# Patient Record
Sex: Female | Born: 1945 | Race: Black or African American | Hispanic: No | Marital: Single | Smoking: Former smoker
Health system: Southern US, Community
[De-identification: ages and names within clinical notes are randomized; demographics above are authoritative.]

## PROBLEM LIST (undated history)

## (undated) DIAGNOSIS — G4730 Sleep apnea, unspecified: Secondary | ICD-10-CM

## (undated) DIAGNOSIS — E119 Type 2 diabetes mellitus without complications: Secondary | ICD-10-CM

## (undated) DIAGNOSIS — J45909 Unspecified asthma, uncomplicated: Secondary | ICD-10-CM

## (undated) DIAGNOSIS — I4891 Unspecified atrial fibrillation: Secondary | ICD-10-CM

## (undated) DIAGNOSIS — I739 Peripheral vascular disease, unspecified: Secondary | ICD-10-CM

## (undated) DIAGNOSIS — N189 Chronic kidney disease, unspecified: Secondary | ICD-10-CM

## (undated) DIAGNOSIS — K219 Gastro-esophageal reflux disease without esophagitis: Secondary | ICD-10-CM

## (undated) DIAGNOSIS — I219 Acute myocardial infarction, unspecified: Secondary | ICD-10-CM

## (undated) DIAGNOSIS — I10 Essential (primary) hypertension: Secondary | ICD-10-CM

## (undated) DIAGNOSIS — I2510 Atherosclerotic heart disease of native coronary artery without angina pectoris: Secondary | ICD-10-CM

## (undated) HISTORY — PX: APPENDECTOMY: SHX54

## (undated) HISTORY — DX: Peripheral vascular disease, unspecified: I73.9

## (undated) HISTORY — DX: Sleep apnea, unspecified: G47.30

## (undated) HISTORY — PX: OTHER SURGICAL HISTORY: SHX169

## (undated) HISTORY — DX: Gastro-esophageal reflux disease without esophagitis: K21.9

---

## 1978-03-08 HISTORY — PX: OTHER SURGICAL HISTORY: SHX169

## 1983-03-09 HISTORY — PX: ABDOMINAL HYSTERECTOMY: SHX81

## 1997-12-05 ENCOUNTER — Encounter: Admission: RE | Admit: 1997-12-05 | Discharge: 1997-12-05 | Payer: Self-pay | Admitting: Internal Medicine

## 1998-06-24 ENCOUNTER — Encounter: Payer: Self-pay | Admitting: Emergency Medicine

## 1998-06-24 ENCOUNTER — Encounter: Admission: RE | Admit: 1998-06-24 | Discharge: 1998-06-24 | Payer: Self-pay | Admitting: *Deleted

## 1998-08-10 ENCOUNTER — Emergency Department (HOSPITAL_COMMUNITY): Admission: EM | Admit: 1998-08-10 | Discharge: 1998-08-10 | Payer: Self-pay | Admitting: Emergency Medicine

## 1998-08-11 ENCOUNTER — Ambulatory Visit (HOSPITAL_COMMUNITY): Admission: RE | Admit: 1998-08-11 | Discharge: 1998-08-11 | Payer: Self-pay | Admitting: Emergency Medicine

## 1998-08-11 ENCOUNTER — Encounter: Payer: Self-pay | Admitting: Obstetrics and Gynecology

## 1998-09-17 ENCOUNTER — Other Ambulatory Visit: Admission: RE | Admit: 1998-09-17 | Discharge: 1998-09-17 | Payer: Self-pay | Admitting: Obstetrics and Gynecology

## 1998-10-21 ENCOUNTER — Ambulatory Visit (HOSPITAL_COMMUNITY): Admission: RE | Admit: 1998-10-21 | Discharge: 1998-10-21 | Payer: Self-pay | Admitting: Cardiovascular Disease

## 1998-10-21 ENCOUNTER — Encounter: Payer: Self-pay | Admitting: Cardiovascular Disease

## 1999-07-28 ENCOUNTER — Ambulatory Visit (HOSPITAL_COMMUNITY): Admission: RE | Admit: 1999-07-28 | Discharge: 1999-07-28 | Payer: Self-pay | Admitting: Gastroenterology

## 1999-09-09 ENCOUNTER — Emergency Department (HOSPITAL_COMMUNITY): Admission: EM | Admit: 1999-09-09 | Discharge: 1999-09-09 | Payer: Self-pay | Admitting: Emergency Medicine

## 1999-09-09 ENCOUNTER — Encounter: Payer: Self-pay | Admitting: Emergency Medicine

## 1999-11-03 ENCOUNTER — Encounter: Payer: Self-pay | Admitting: Endocrinology

## 1999-11-03 ENCOUNTER — Ambulatory Visit (HOSPITAL_COMMUNITY): Admission: RE | Admit: 1999-11-03 | Discharge: 1999-11-03 | Payer: Self-pay | Admitting: Endocrinology

## 1999-12-24 ENCOUNTER — Other Ambulatory Visit: Admission: RE | Admit: 1999-12-24 | Discharge: 1999-12-24 | Payer: Self-pay | Admitting: Obstetrics and Gynecology

## 2000-01-08 ENCOUNTER — Ambulatory Visit (HOSPITAL_COMMUNITY): Admission: RE | Admit: 2000-01-08 | Discharge: 2000-01-08 | Payer: Self-pay

## 2001-01-19 ENCOUNTER — Other Ambulatory Visit: Admission: RE | Admit: 2001-01-19 | Discharge: 2001-01-19 | Payer: Self-pay | Admitting: Obstetrics and Gynecology

## 2001-04-30 ENCOUNTER — Emergency Department (HOSPITAL_COMMUNITY): Admission: EM | Admit: 2001-04-30 | Discharge: 2001-04-30 | Payer: Self-pay | Admitting: *Deleted

## 2001-04-30 ENCOUNTER — Encounter: Payer: Self-pay | Admitting: *Deleted

## 2002-01-18 ENCOUNTER — Other Ambulatory Visit: Admission: RE | Admit: 2002-01-18 | Discharge: 2002-01-18 | Payer: Self-pay | Admitting: Obstetrics and Gynecology

## 2002-02-15 ENCOUNTER — Encounter: Admission: RE | Admit: 2002-02-15 | Discharge: 2002-02-15 | Payer: Self-pay | Admitting: Obstetrics and Gynecology

## 2002-02-15 ENCOUNTER — Encounter: Payer: Self-pay | Admitting: Obstetrics and Gynecology

## 2002-03-09 ENCOUNTER — Encounter: Admission: RE | Admit: 2002-03-09 | Discharge: 2002-03-09 | Payer: Self-pay | Admitting: Obstetrics and Gynecology

## 2002-03-09 ENCOUNTER — Encounter: Payer: Self-pay | Admitting: Obstetrics and Gynecology

## 2002-03-16 ENCOUNTER — Encounter: Payer: Self-pay | Admitting: Obstetrics and Gynecology

## 2002-03-16 ENCOUNTER — Encounter: Admission: RE | Admit: 2002-03-16 | Discharge: 2002-03-16 | Payer: Self-pay | Admitting: Obstetrics and Gynecology

## 2003-02-05 ENCOUNTER — Other Ambulatory Visit: Admission: RE | Admit: 2003-02-05 | Discharge: 2003-02-05 | Payer: Self-pay | Admitting: Obstetrics and Gynecology

## 2003-07-08 ENCOUNTER — Encounter: Admission: RE | Admit: 2003-07-08 | Discharge: 2003-07-08 | Payer: Self-pay | Admitting: Endocrinology

## 2004-06-22 ENCOUNTER — Encounter: Admission: RE | Admit: 2004-06-22 | Discharge: 2004-06-22 | Payer: Self-pay | Admitting: Endocrinology

## 2004-08-10 ENCOUNTER — Other Ambulatory Visit: Admission: RE | Admit: 2004-08-10 | Discharge: 2004-08-10 | Payer: Self-pay | Admitting: Obstetrics and Gynecology

## 2004-08-25 ENCOUNTER — Encounter: Admission: RE | Admit: 2004-08-25 | Discharge: 2004-08-25 | Payer: Self-pay | Admitting: Obstetrics and Gynecology

## 2004-11-24 ENCOUNTER — Emergency Department (HOSPITAL_COMMUNITY): Admission: EM | Admit: 2004-11-24 | Discharge: 2004-11-24 | Payer: Self-pay | Admitting: Emergency Medicine

## 2005-10-05 ENCOUNTER — Encounter: Admission: RE | Admit: 2005-10-05 | Discharge: 2005-10-05 | Payer: Self-pay | Admitting: Obstetrics and Gynecology

## 2006-07-07 ENCOUNTER — Encounter: Admission: RE | Admit: 2006-07-07 | Discharge: 2006-07-07 | Payer: Self-pay | Admitting: Family Medicine

## 2006-07-20 ENCOUNTER — Encounter: Admission: RE | Admit: 2006-07-20 | Discharge: 2006-07-20 | Payer: Self-pay | Admitting: Family Medicine

## 2007-03-13 ENCOUNTER — Emergency Department (HOSPITAL_COMMUNITY): Admission: EM | Admit: 2007-03-13 | Discharge: 2007-03-14 | Payer: Self-pay | Admitting: Emergency Medicine

## 2007-11-23 ENCOUNTER — Encounter: Admission: RE | Admit: 2007-11-23 | Discharge: 2007-11-23 | Payer: Self-pay | Admitting: Family Medicine

## 2008-03-05 ENCOUNTER — Encounter: Admission: RE | Admit: 2008-03-05 | Discharge: 2008-03-05 | Payer: Self-pay | Admitting: Family Medicine

## 2008-03-20 ENCOUNTER — Encounter: Admission: RE | Admit: 2008-03-20 | Discharge: 2008-03-20 | Payer: Self-pay | Admitting: Family Medicine

## 2008-09-27 ENCOUNTER — Encounter: Admission: RE | Admit: 2008-09-27 | Discharge: 2008-09-27 | Payer: Self-pay | Admitting: Family Medicine

## 2009-02-17 ENCOUNTER — Encounter: Admission: RE | Admit: 2009-02-17 | Discharge: 2009-02-17 | Payer: Self-pay | Admitting: Family Medicine

## 2010-03-29 ENCOUNTER — Encounter: Payer: Self-pay | Admitting: Family Medicine

## 2010-07-24 NOTE — Op Note (Signed)
[place]  Patient:    Dawn Wood, Dawn Wood                      MRN: [id number] Proc. Date: 07/28/99 Adm. Date:  [id number] Disc. Date: [id number] Attending:  Charna Elizabeth CC:         Reather Littler, M.D.                           Operative Report  DATE OF BIRTH:  03/16/1945  REFERRING PHYSICIAN:  Reather Littler, M.D.  PROCEDURE PERFORMED:  Colonoscopy.  ENDOSCOPIST:  Anselmo Rod, M.D.  INSTRUMENT:  Olympus video colonoscope.  INDICATION FOR PROCEDURE:  Blood in stool with a partial history of polyps in a 65 year old black female.  Rule out recurrent polyps.  PREPROCEDURE PREPARATION:  Informed consent was procured from the patient. The patient was fasting for eight hours prior to the procedure and prepped with a bottle of magnesium citrate and a gallon of NuLYTELY the night prior to the procedure.  PREPROCEDURE PHYSICAL:  VITAL SIGNS:  Stable.  NECK:  Supple.  CHEST:  Clear to auscultation.  HEART:  S1, S2 regular.  ABDOMEN:  Soft with normal abdominal bowel sounds.  DESCRIPTION OF THE PROCEDURE:  The patient was placed in the left lateral decubitus position and sedated with 80 mg of Demerol and 7.5 mg of Versed intravenously.  Once the patient was adequately sedated and maintained on low-flow oxygen and continuous cardiac monitoring, the Olympus video colonoscope was advanced from the rectum to the cecum without difficulty. The entire colon appeared healthy without evidence of diverticulosis, masses, polyps, erosions, or ulcerations.  No hemorrhoids were seen on retroflexion.  The patient tolerated the procedure well without complications.  IMPRESSION:  Normal colonoscopy.  RECOMMENDATIONS: 1. The patient has been advised to increased fluid and fiber in her diet. 2. Outpatient followup is advised in the next two weeks for further    recommendations. 3. Considering her personal history of polyps, a repeat colonoscopy is  recommended in the next five years.DD:  07/28/99 TD:  08/01/99 Job: [id number]

## 2010-08-07 ENCOUNTER — Other Ambulatory Visit: Payer: Self-pay | Admitting: Family Medicine

## 2010-08-07 DIAGNOSIS — Z1231 Encounter for screening mammogram for malignant neoplasm of breast: Secondary | ICD-10-CM

## 2010-08-18 ENCOUNTER — Ambulatory Visit
Admission: RE | Admit: 2010-08-18 | Discharge: 2010-08-18 | Disposition: A | Discharge status: Home / Self Care | Payer: BC Managed Care – PPO | Source: Ambulatory Visit | Attending: Family Medicine | Admitting: Family Medicine

## 2010-08-18 DIAGNOSIS — Z1231 Encounter for screening mammogram for malignant neoplasm of breast: Secondary | ICD-10-CM

## 2010-08-19 ENCOUNTER — Other Ambulatory Visit: Payer: Self-pay | Admitting: Family Medicine

## 2010-08-19 DIAGNOSIS — R928 Other abnormal and inconclusive findings on diagnostic imaging of breast: Secondary | ICD-10-CM

## 2010-08-27 ENCOUNTER — Ambulatory Visit
Admission: RE | Admit: 2010-08-27 | Discharge: 2010-08-27 | Disposition: A | Discharge status: Home / Self Care | Payer: BC Managed Care – PPO | Source: Ambulatory Visit | Attending: Family Medicine | Admitting: Family Medicine

## 2010-08-27 DIAGNOSIS — R928 Other abnormal and inconclusive findings on diagnostic imaging of breast: Secondary | ICD-10-CM

## 2010-11-26 LAB — COMPREHENSIVE METABOLIC PANEL
ALT: 17
AST: 15
Albumin: 3.8
Alkaline Phosphatase: 93
BUN: 14
CO2: 27
Calcium: 9.3
Chloride: 103
Creatinine, Ser: 0.82
GFR calc Af Amer: >60
GFR calc non Af Amer: >60
Glucose, Bld: 159 — ABNORMAL HIGH
Potassium: 3.3 — ABNORMAL LOW
Sodium: 139
Total Bilirubin: 0.6
Total Protein: 7.3

## 2010-11-26 LAB — DIFFERENTIAL
Basophils Absolute: 0
Basophils Relative: 0
Eosinophils Absolute: 0.1
Eosinophils Relative: 0
Lymphocytes Relative: 14
Lymphs Abs: 2.1
Monocytes Absolute: 0.3
Monocytes Relative: 2 — ABNORMAL LOW
Neutro Abs: 12.9 — ABNORMAL HIGH
Neutrophils Relative %: 84 — ABNORMAL HIGH

## 2010-11-26 LAB — CBC
HCT: 38.7
Hemoglobin: 12.8
MCHC: 33
MCV: 82
Platelets: 292
RBC: 4.72
RDW: 15.1
WBC: 15.3 — ABNORMAL HIGH

## 2010-11-26 LAB — URINALYSIS, ROUTINE W REFLEX MICROSCOPIC
Bilirubin Urine: NEGATIVE
Glucose, UA: NEGATIVE
Ketones, ur: NEGATIVE
Leukocytes, UA: NEGATIVE
Nitrite: NEGATIVE
Protein, ur: NEGATIVE
Specific Gravity, Urine: 1.011
Urobilinogen, UA: 0.2
pH: 6.5

## 2010-11-26 LAB — URINE MICROSCOPIC-ADD ON

## 2010-11-26 LAB — LIPASE, BLOOD: Lipase: 35

## 2011-06-07 ENCOUNTER — Other Ambulatory Visit: Payer: Self-pay | Admitting: Family Medicine

## 2011-06-07 ENCOUNTER — Ambulatory Visit
Admission: RE | Admit: 2011-06-07 | Discharge: 2011-06-07 | Disposition: A | Discharge status: Home / Self Care | Payer: BC Managed Care – PPO | Source: Ambulatory Visit | Attending: Family Medicine | Admitting: Family Medicine

## 2011-06-07 DIAGNOSIS — M79645 Pain in left finger(s): Secondary | ICD-10-CM

## 2011-08-19 ENCOUNTER — Other Ambulatory Visit: Payer: Self-pay | Admitting: Family Medicine

## 2011-08-19 DIAGNOSIS — Z1231 Encounter for screening mammogram for malignant neoplasm of breast: Secondary | ICD-10-CM

## 2011-09-14 ENCOUNTER — Ambulatory Visit
Admission: RE | Admit: 2011-09-14 | Discharge: 2011-09-14 | Disposition: A | Discharge status: Home / Self Care | Payer: BC Managed Care – PPO | Source: Ambulatory Visit | Attending: Family Medicine | Admitting: Family Medicine

## 2011-09-14 ENCOUNTER — Other Ambulatory Visit: Payer: Self-pay | Admitting: Family Medicine

## 2011-09-14 DIAGNOSIS — R319 Hematuria, unspecified: Secondary | ICD-10-CM

## 2011-09-17 ENCOUNTER — Ambulatory Visit
Admission: RE | Admit: 2011-09-17 | Discharge: 2011-09-17 | Disposition: A | Discharge status: Home / Self Care | Payer: BC Managed Care – PPO | Source: Ambulatory Visit | Attending: Family Medicine | Admitting: Family Medicine

## 2011-09-17 DIAGNOSIS — Z1231 Encounter for screening mammogram for malignant neoplasm of breast: Secondary | ICD-10-CM

## 2012-10-13 ENCOUNTER — Other Ambulatory Visit: Payer: Self-pay | Admitting: Family Medicine

## 2012-10-13 DIAGNOSIS — R1011 Right upper quadrant pain: Secondary | ICD-10-CM

## 2012-10-18 ENCOUNTER — Ambulatory Visit
Admission: RE | Admit: 2012-10-18 | Discharge: 2012-10-18 | Disposition: A | Discharge status: Home / Self Care | Payer: BC Managed Care – PPO | Source: Ambulatory Visit | Attending: Family Medicine | Admitting: Family Medicine

## 2012-10-18 DIAGNOSIS — R1011 Right upper quadrant pain: Secondary | ICD-10-CM

## 2012-12-26 ENCOUNTER — Other Ambulatory Visit: Payer: Self-pay | Admitting: Gastroenterology

## 2012-12-26 DIAGNOSIS — R1011 Right upper quadrant pain: Secondary | ICD-10-CM

## 2012-12-26 DIAGNOSIS — R112 Nausea with vomiting, unspecified: Secondary | ICD-10-CM

## 2013-01-03 ENCOUNTER — Encounter (HOSPITAL_COMMUNITY)
Admission: RE | Admit: 2013-01-03 | Discharge: 2013-01-03 | Disposition: A | Discharge status: Home / Self Care | Payer: BC Managed Care – PPO | Source: Ambulatory Visit | Attending: Gastroenterology | Admitting: Gastroenterology

## 2013-01-03 DIAGNOSIS — R112 Nausea with vomiting, unspecified: Secondary | ICD-10-CM | POA: Insufficient documentation

## 2013-01-03 DIAGNOSIS — R1011 Right upper quadrant pain: Secondary | ICD-10-CM | POA: Insufficient documentation

## 2013-01-03 MED ORDER — SINCALIDE 5 MCG IJ SOLR
0.0200 ug/kg | Freq: Once | INTRAMUSCULAR | Status: AC
  Administered 2013-01-03: 1.6 ug via INTRAVENOUS

## 2013-01-03 MED ORDER — SINCALIDE 5 MCG IJ SOLR
INTRAMUSCULAR | Status: AC
  Administered 2013-01-03: 1.6 ug via INTRAVENOUS
  Filled 2013-01-03: qty 5

## 2013-01-03 MED ORDER — TECHNETIUM TC 99M MEBROFENIN IV KIT
5.0000 | PACK | Freq: Once | INTRAVENOUS | Status: AC | PRN
  Administered 2013-01-03: 5 via INTRAVENOUS

## 2013-02-07 ENCOUNTER — Other Ambulatory Visit: Payer: Self-pay

## 2013-02-07 DIAGNOSIS — Z1231 Encounter for screening mammogram for malignant neoplasm of breast: Secondary | ICD-10-CM

## 2013-03-08 HISTORY — PX: CORONARY STENT PLACEMENT: SHX1402

## 2013-03-13 ENCOUNTER — Ambulatory Visit
Admission: RE | Admit: 2013-03-13 | Discharge: 2013-03-13 | Disposition: A | Discharge status: Home / Self Care | Payer: BC Managed Care – PPO | Source: Ambulatory Visit

## 2013-03-13 DIAGNOSIS — Z1231 Encounter for screening mammogram for malignant neoplasm of breast: Secondary | ICD-10-CM

## 2013-03-16 ENCOUNTER — Other Ambulatory Visit: Payer: Self-pay | Admitting: Family Medicine

## 2013-03-16 DIAGNOSIS — R928 Other abnormal and inconclusive findings on diagnostic imaging of breast: Secondary | ICD-10-CM

## 2013-03-26 ENCOUNTER — Ambulatory Visit
Admission: RE | Admit: 2013-03-26 | Discharge: 2013-03-26 | Disposition: A | Discharge status: Home / Self Care | Payer: BC Managed Care – PPO | Source: Ambulatory Visit | Attending: Family Medicine | Admitting: Family Medicine

## 2013-03-26 DIAGNOSIS — R928 Other abnormal and inconclusive findings on diagnostic imaging of breast: Secondary | ICD-10-CM

## 2013-05-26 LAB — BASIC METABOLIC PANEL
Anion Gap: 6 — ABNORMAL LOW (ref 7–16)
BUN: 17 mg/dL (ref 7–18)
Calcium, Total: 8.5 mg/dL (ref 8.5–10.1)
Chloride: 109 mmol/L — ABNORMAL HIGH (ref 98–107)
Co2: 24 mmol/L (ref 21–32)
Creatinine: 1.21 mg/dL (ref 0.60–1.30)
EGFR (African American): 53 — ABNORMAL LOW
EGFR (Non-African Amer.): 46 — ABNORMAL LOW
Glucose: 199 mg/dL — ABNORMAL HIGH (ref 65–99)
Osmolality: 285 (ref 275–301)
Potassium: 3.6 mmol/L (ref 3.5–5.1)
Sodium: 139 mmol/L (ref 136–145)

## 2013-05-26 LAB — TROPONIN I: Troponin-I: 0.06 ng/mL — ABNORMAL HIGH

## 2013-05-26 LAB — CBC
HCT: 40.6 % (ref 35.0–47.0)
HGB: 12.9 g/dL (ref 12.0–16.0)
MCH: 27 pg (ref 26.0–34.0)
MCHC: 31.8 g/dL — ABNORMAL LOW (ref 32.0–36.0)
MCV: 85 fL (ref 80–100)
Platelet: 246 10*3/uL (ref 150–440)
RBC: 4.79 10*6/uL (ref 3.80–5.20)
RDW: 14.5 % (ref 11.5–14.5)
WBC: 10.8 10*3/uL (ref 3.6–11.0)

## 2013-05-27 ENCOUNTER — Inpatient Hospital Stay: Payer: Self-pay | Admitting: Internal Medicine

## 2013-05-27 LAB — CK-MB
CK-MB: 2 ng/mL (ref 0.5–3.6)
CK-MB: 10.6 ng/mL — ABNORMAL HIGH (ref 0.5–3.6)
CK-MB: 13.6 ng/mL — ABNORMAL HIGH (ref 0.5–3.6)
CK-MB: 13.2 ng/mL — ABNORMAL HIGH (ref 0.5–3.6)

## 2013-05-27 LAB — TROPONIN I
Troponin-I: 1.6 ng/mL — ABNORMAL HIGH
Troponin-I: 3.9 ng/mL — ABNORMAL HIGH

## 2013-05-28 LAB — LIPID PANEL
Cholesterol: 167 mg/dL (ref 0–200)
HDL Cholesterol: 37 mg/dL — ABNORMAL LOW (ref 40–60)
Ldl Cholesterol, Calc: 94 mg/dL (ref 0–100)
Triglycerides: 180 mg/dL (ref 0–200)
VLDL Cholesterol, Calc: 36 mg/dL (ref 5–40)

## 2013-05-28 LAB — CK TOTAL AND CKMB (NOT AT ARMC)
CK, Total: 88 U/L
CK-MB: 2.8 ng/mL (ref 0.5–3.6)

## 2013-05-29 LAB — BASIC METABOLIC PANEL
Anion Gap: 6 — ABNORMAL LOW (ref 7–16)
BUN: 10 mg/dL (ref 7–18)
Calcium, Total: 8.7 mg/dL (ref 8.5–10.1)
Chloride: 107 mmol/L (ref 98–107)
Co2: 27 mmol/L (ref 21–32)
Creatinine: 0.82 mg/dL (ref 0.60–1.30)
EGFR (African American): >60
EGFR (Non-African Amer.): >60
Glucose: 139 mg/dL — ABNORMAL HIGH (ref 65–99)
Osmolality: 281 (ref 275–301)
Potassium: 3.7 mmol/L (ref 3.5–5.1)
Sodium: 140 mmol/L (ref 136–145)

## 2013-06-13 ENCOUNTER — Emergency Department (HOSPITAL_COMMUNITY): Payer: BC Managed Care – PPO

## 2013-06-13 ENCOUNTER — Encounter (HOSPITAL_COMMUNITY): Payer: Self-pay | Admitting: Emergency Medicine

## 2013-06-13 ENCOUNTER — Emergency Department (HOSPITAL_COMMUNITY)
Admission: EM | Admit: 2013-06-13 | Discharge: 2013-06-13 | Disposition: A | Discharge status: Home / Self Care | Payer: BC Managed Care – PPO | Attending: Emergency Medicine | Admitting: Emergency Medicine

## 2013-06-13 DIAGNOSIS — Z79899 Other long term (current) drug therapy: Secondary | ICD-10-CM | POA: Insufficient documentation

## 2013-06-13 DIAGNOSIS — Z7982 Long term (current) use of aspirin: Secondary | ICD-10-CM | POA: Insufficient documentation

## 2013-06-13 DIAGNOSIS — R0600 Dyspnea, unspecified: Secondary | ICD-10-CM

## 2013-06-13 DIAGNOSIS — R0989 Other specified symptoms and signs involving the circulatory and respiratory systems: Secondary | ICD-10-CM | POA: Insufficient documentation

## 2013-06-13 DIAGNOSIS — E119 Type 2 diabetes mellitus without complications: Secondary | ICD-10-CM | POA: Insufficient documentation

## 2013-06-13 DIAGNOSIS — R079 Chest pain, unspecified: Secondary | ICD-10-CM | POA: Insufficient documentation

## 2013-06-13 DIAGNOSIS — Z9861 Coronary angioplasty status: Secondary | ICD-10-CM | POA: Insufficient documentation

## 2013-06-13 DIAGNOSIS — I2510 Atherosclerotic heart disease of native coronary artery without angina pectoris: Secondary | ICD-10-CM | POA: Insufficient documentation

## 2013-06-13 DIAGNOSIS — Z8709 Personal history of other diseases of the respiratory system: Secondary | ICD-10-CM | POA: Insufficient documentation

## 2013-06-13 DIAGNOSIS — R0609 Other forms of dyspnea: Secondary | ICD-10-CM | POA: Insufficient documentation

## 2013-06-13 DIAGNOSIS — I10 Essential (primary) hypertension: Secondary | ICD-10-CM | POA: Insufficient documentation

## 2013-06-13 DIAGNOSIS — I252 Old myocardial infarction: Secondary | ICD-10-CM | POA: Insufficient documentation

## 2013-06-13 HISTORY — DX: Acute myocardial infarction, unspecified: I21.9

## 2013-06-13 HISTORY — DX: Essential (primary) hypertension: I10

## 2013-06-13 HISTORY — DX: Atherosclerotic heart disease of native coronary artery without angina pectoris: I25.10

## 2013-06-13 HISTORY — DX: Type 2 diabetes mellitus without complications: E11.9

## 2013-06-13 LAB — CBC
HCT: 40.8 % (ref 36.0–46.0)
Hemoglobin: 13.6 g/dL (ref 12.0–15.0)
MCH: 27.4 pg (ref 26.0–34.0)
MCHC: 33.3 g/dL (ref 30.0–36.0)
MCV: 82.3 fL (ref 78.0–100.0)
Platelets: 258 10*3/uL (ref 150–400)
RBC: 4.96 MIL/uL (ref 3.87–5.11)
RDW: 14.2 % (ref 11.5–15.5)
WBC: 5.9 10*3/uL (ref 4.0–10.5)

## 2013-06-13 LAB — BASIC METABOLIC PANEL
BUN: 14 mg/dL (ref 6–23)
CO2: 21 mEq/L (ref 19–32)
Calcium: 9.4 mg/dL (ref 8.4–10.5)
Chloride: 104 mEq/L (ref 96–112)
Creatinine, Ser: 0.91 mg/dL (ref 0.50–1.10)
GFR calc Af Amer: 73 mL/min — ABNORMAL LOW (ref >90)
GFR calc non Af Amer: 63 mL/min — ABNORMAL LOW (ref >90)
Glucose, Bld: 123 mg/dL — ABNORMAL HIGH (ref 70–99)
Potassium: 4 mEq/L (ref 3.7–5.3)
Sodium: 142 mEq/L (ref 137–147)

## 2013-06-13 LAB — I-STAT TROPONIN, ED: Troponin i, poc: 0 ng/mL (ref 0.00–0.08)

## 2013-06-13 LAB — PRO B NATRIURETIC PEPTIDE: Pro B Natriuretic peptide (BNP): 284.4 pg/mL — ABNORMAL HIGH (ref 0–125)

## 2013-06-13 LAB — D-DIMER, QUANTITATIVE: D-Dimer, Quant: 0.57 ug/mL-FEU — ABNORMAL HIGH (ref 0.00–0.48)

## 2013-06-13 MED ORDER — IOHEXOL 350 MG/ML SOLN
100.0000 mL | Freq: Once | INTRAVENOUS | Status: AC | PRN
  Administered 2013-06-13: 100 mL via INTRAVENOUS

## 2013-06-13 NOTE — ED Notes (Signed)
Pt ambulated to restroom. 

## 2013-06-13 NOTE — ED Notes (Addendum)
Pt c/o constant sob X 1 week, sts 2 weeks ago she had an MI then last week was put on new medications, since the new medications she has started to feel this way, sts she isn't able to sleep well. Has spoken to her cardiologist since starting the new meds, and spoke with him about them making her sob, pt sts he told her that it should go away eventually. Pt sts it still hasn't gone away so she is concerned. sts on Friday she started to have chills and cough, went to PCP and was dx with the flu, currently taking tamiflu for this. Nad, skin warm and dry, resp e/u.

## 2013-06-13 NOTE — Discharge Instructions (Signed)

## 2013-06-13 NOTE — ED Provider Notes (Signed)
CSN: [id number]     Arrival date & time 06/13/13  1546 History   First MD Initiated Contact with Patient 06/13/13 1702     Chief Complaint  Patient presents with  . Shortness of Breath  . Chest Pain    Pt reports "sore from coughing, I have the flu".     (Consider location/radiation/quality/duration/timing/severity/associated sxs/prior Treatment) Patient is a 68 y.o. female presenting with shortness of breath and chest pain. The history is provided by the patient.  Shortness of Breath Associated symptoms: chest pain   Chest Pain Associated symptoms: shortness of breath    Patient here complaining of shortness of breath x1 week. Told her physicians about this visit this was likely due to her medications. She does have a recent history of MI but denies any chest pain. She has had recent cough and congestion and was diagnosed with influenza 5 days ago. She denies any fever within the last 24 hours. Denies any vomiting or diarrhea. Symptoms have been waxing and waning. Denies any orthopnea. No treatment used prior to arrival. She has not changed her medications. No leg pain or swelling. Past Medical History  Diagnosis Date  . Diabetes mellitus without complication   . Hypertension   . Coronary artery disease   . MI (myocardial infarction)    Past Surgical History  Procedure Laterality Date  . Coronary stent placement    . Abdominal hysterectomy     No family history on file. History  Substance Use Topics  . Smoking status: Never Smoker   . Smokeless tobacco: Not on file  . Alcohol Use: No   OB History   Grav Para Term Preterm Abortions TAB SAB Ect Mult Living                 Review of Systems  Respiratory: Positive for shortness of breath.   Cardiovascular: Positive for chest pain.  All other systems reviewed and are negative.     Allergies  Review of patient's allergies indicates no known allergies.  Home Medications   Current Outpatient Rx  Name  Route  Sig   Dispense  Refill  . aspirin 81 MG tablet   Oral   Take 81 mg by mouth daily.         . Cholecalciferol (VITAMIN D) 2000 UNITS CAPS   Oral   Take 1 capsule by mouth daily.         . fluticasone (FLONASE) 50 MCG/ACT nasal spray   Each Nare   Place 1 spray into both nostrils daily as needed for allergies or rhinitis.         Marland Kitchen. lisinopril (PRINIVIL,ZESTRIL) 20 MG tablet   Oral   Take 20 mg by mouth daily.         . metFORMIN (GLUMETZA) 500 MG (MOD) 24 hr tablet   Oral   Take 500 mg by mouth 2 (two) times daily with a meal.         . metoprolol tartrate (LOPRESSOR) 25 MG tablet   Oral   Take 25 mg by mouth daily.         . simvastatin (ZOCOR) 40 MG tablet   Oral   Take 40 mg by mouth daily.         . sitaGLIPtin (JANUVIA) 100 MG tablet   Oral   Take 100 mg by mouth daily.         Marland Kitchen. spironolactone (ALDACTONE) 25 MG tablet   Oral   Take 25 mg  by mouth daily.         . Ticagrelor (BRILINTA) 90 MG TABS tablet   Oral   Take 90 mg by mouth 2 (two) times daily.          BP 172/71  Pulse 69  Temp(Src) 99.2 F (37.3 C) (Oral)  Resp 15  SpO2 100% Physical Exam  Nursing note and vitals reviewed. Constitutional: She is oriented to person, place, and time. She appears well-developed and well-nourished.  Non-toxic appearance. No distress.  HENT:  Head: Normocephalic and atraumatic.  Eyes: Conjunctivae, EOM and lids are normal. Pupils are equal, round, and reactive to light.  Neck: Normal range of motion. Neck supple. No tracheal deviation present. No mass present.  Cardiovascular: Normal rate, regular rhythm and normal heart sounds.  Exam reveals no gallop.   No murmur heard. Pulmonary/Chest: Effort normal and breath sounds normal. No stridor. No respiratory distress. She has no decreased breath sounds. She has no wheezes. She has no rhonchi. She has no rales.  Abdominal: Soft. Normal appearance and bowel sounds are normal. She exhibits no distension. There  is no tenderness. There is no rebound and no CVA tenderness.  Musculoskeletal: Normal range of motion. She exhibits no edema and no tenderness.  Neurological: She is alert and oriented to person, place, and time. She has normal strength. No cranial nerve deficit or sensory deficit. GCS eye subscore is 4. GCS verbal subscore is 5. GCS motor subscore is 6.  Skin: Skin is warm and dry. No abrasion and no rash noted.  Psychiatric: She has a normal mood and affect. Her speech is normal and behavior is normal.    ED Course  Procedures (including critical care time) Labs Review Labs Reviewed  BASIC METABOLIC PANEL - Abnormal; Notable for the following:    Glucose, Bld 123 (*)    GFR calc non Af Amer 63 (*)    GFR calc Af Amer 73 (*)    All other components within normal limits  PRO B NATRIURETIC PEPTIDE - Abnormal; Notable for the following:    Pro B Natriuretic peptide (BNP) 284.4 (*)    All other components within normal limits  CBC  D-DIMER, QUANTITATIVE  I-STAT TROPOININ, ED   Imaging Review Ct Angio Chest Pe W/cm &/or Wo Cm  06/13/2013   CLINICAL DATA:  Chest pain.  EXAM: CT ANGIOGRAPHY CHEST WITH CONTRAST  TECHNIQUE: Multidetector CT imaging of the chest was performed using the standard protocol during bolus administration of intravenous contrast. Multiplanar CT image reconstructions and MIPs were obtained to evaluate the vascular anatomy.  CONTRAST:  80 mL OMNIPAQUE IOHEXOL 350 MG/ML SOLN  COMPARISON:  CT scan of September 14, 2011.  FINDINGS: No pleural effusion or pneumothorax is noted. No acute pulmonary disease is noted. There is no evidence of pulmonary embolus. The thoracic aorta appears normal. Coronary artery calcifications are noted. No significant adenopathy is noted. Stable left adrenal adenoma compared to prior exam.  Review of the MIP images confirms the above findings.  IMPRESSION: No evidence of pulmonary embolus.  Coronary artery calcifications are noted consistent with coronary  artery disease.   Electronically Signed   By: Roque Lias M.D.   On: 06/13/2013 18:28     EKG Interpretation   Date/Time:  Wednesday June 13 2013 15:52:53 EDT Ventricular Rate:  64 PR Interval:  128 QRS Duration: 84 QT Interval:  414 QTC Calculation: 427 R Axis:   29 Text Interpretation:  Normal sinus rhythm Nonspecific T wave abnormality  Abnormal ECG Confirmed by Freida Busman  MD, Malikiah Debarr ([id number]) on 06/13/2013 5:23:34  PM      MDM   Final diagnoses:  None   Patient with negative workup here for pulmonary embolism or pneumonia. Do not believe that her symptoms represent ACS. She is to for discharge     Toy Baker, MD 06/13/13 2006

## 2013-06-13 NOTE — ED Notes (Signed)
MD at bedside. 

## 2013-06-13 NOTE — ED Notes (Addendum)
Pt reports recent MI that was treated at [facility].  Pt states she began having SOB a week ago.  Pt states that she told her cardiologist that she thinks her new meds are causing her SOB.  Pt reports CP, but states she thinks it's just sore from coughing because she has the flu.

## 2013-07-02 ENCOUNTER — Telehealth: Payer: Self-pay

## 2013-07-02 ENCOUNTER — Encounter: Payer: Self-pay | Admitting: Interventional Cardiology

## 2013-07-02 ENCOUNTER — Ambulatory Visit (INDEPENDENT_AMBULATORY_CARE_PROVIDER_SITE_OTHER): Payer: BC Managed Care – PPO | Admitting: Interventional Cardiology

## 2013-07-02 VITALS — BP 190/95 | HR 72 | Ht 66.0 in | Wt 176.0 lb

## 2013-07-02 DIAGNOSIS — R05 Cough: Secondary | ICD-10-CM

## 2013-07-02 DIAGNOSIS — R0989 Other specified symptoms and signs involving the circulatory and respiratory systems: Secondary | ICD-10-CM

## 2013-07-02 DIAGNOSIS — IMO0001 Reserved for inherently not codable concepts without codable children: Secondary | ICD-10-CM

## 2013-07-02 DIAGNOSIS — I25118 Atherosclerotic heart disease of native coronary artery with other forms of angina pectoris: Secondary | ICD-10-CM | POA: Insufficient documentation

## 2013-07-02 DIAGNOSIS — I2510 Atherosclerotic heart disease of native coronary artery without angina pectoris: Secondary | ICD-10-CM

## 2013-07-02 DIAGNOSIS — R059 Cough, unspecified: Secondary | ICD-10-CM

## 2013-07-02 DIAGNOSIS — R0609 Other forms of dyspnea: Secondary | ICD-10-CM

## 2013-07-02 DIAGNOSIS — I10 Essential (primary) hypertension: Secondary | ICD-10-CM | POA: Insufficient documentation

## 2013-07-02 DIAGNOSIS — K219 Gastro-esophageal reflux disease without esophagitis: Secondary | ICD-10-CM

## 2013-07-02 DIAGNOSIS — R0600 Dyspnea, unspecified: Secondary | ICD-10-CM

## 2013-07-02 DIAGNOSIS — E1165 Type 2 diabetes mellitus with hyperglycemia: Secondary | ICD-10-CM | POA: Insufficient documentation

## 2013-07-02 DIAGNOSIS — IMO0002 Reserved for concepts with insufficient information to code with codable children: Secondary | ICD-10-CM

## 2013-07-02 MED ORDER — CLOPIDOGREL BISULFATE 75 MG PO TABS: 75.0000 mg | ORAL_TABLET | Freq: Every day | ORAL | Status: DC

## 2013-07-02 MED ORDER — METOPROLOL SUCCINATE ER 100 MG PO TB24: 100.0000 mg | ORAL_TABLET | Freq: Every day | ORAL | Status: DC

## 2013-07-02 MED ORDER — AMLODIPINE BESYLATE 5 MG PO TABS: 5.0000 mg | ORAL_TABLET | Freq: Every day | ORAL | Status: DC

## 2013-07-02 NOTE — Patient Instructions (Signed)
Your physician has recommended you make the following change in your medication:  1) STOP Brilinta 2) START Plavix 75mg  daily 3) START Amlodipine 5mg  daily   We will request your record from [facility] and Dr.Kahn  You have a follow up appointment scheduled for 08/02/13 @3 :30pm

## 2013-07-02 NOTE — Telephone Encounter (Signed)
pt adv per Dr.Smith she should not take Meloxixam,NSAID,Naproxen, with Plavix pt can take tylenol as needed for pain.pt verbal;ized understanding.

## 2013-07-02 NOTE — Progress Notes (Addendum)
Patient ID: Dawn Wood [place], female   DOB: [birth year], 68 y.o.   MRN: [id number]   Date: 07/02/2013 ID: Dawn Wood [place], DOB [birth year], MRN [id number] PCP: Pcp Not In System  Reason: Recent heart attack with stent  ASSESSMENT;  1. Coronary artery disease, native, with recent stent at [facility] 2. Hypertension, poorly controlled. She is taken Tribenzor without complications in the past. 3. Dyspnea, possibly related to Brilinta, started after hospitalization 4. Cough, possibly related to ACE/ARB. Started after hospitalization 5. Diabetes mellitus  PLAN:  1. Discontinue Brilinta and start clopidogrel 75 mg daily with a one-day overlap 2. Amlodipine for blood pressure control 3. Clinical followup in one month 4. Obtain information from [facility] with reference to her recent cardiac illness and procedure   SUBJECTIVE: Dawn Wood [place] is a 68 y.o. female who is here to establish an received longitudinal tear after suffering a myocardial infarction on March 24. She was care for at [facility] and receive a stent. Blood vessel that was stented is not known. She has had no recurrence of chest discomfort as she did prior to admission. She does complain of cough and dyspnea. She is also dissatisfied that her blood pressure and blood sugars are now out of control after medication adjustments following a heart attack. The medication list below his hematocrit. The patient is on metoprolol succinate 100 mg daily and losartan 100 mg daily. She is not taking metoprolol tartrate.  She tells me she had a heart attack. She does not know when the stent was placed. Her symptoms were sudden in onset. She has had no recurrence since the procedure where Dr. Park BreedKahn placed a drug-eluting stent.  She denies skin rash, decreased appetite, palpitations, and nitroglycerin use per   No Known Allergies  Current Outpatient Prescriptions on File Prior to Visit  Medication  Sig Dispense Refill  . aspirin 81 MG tablet Take 81 mg by mouth daily.      . Cholecalciferol (VITAMIN D) 2000 UNITS CAPS Take 1 capsule by mouth daily.      . fluticasone (FLONASE) 50 MCG/ACT nasal spray Place 1 spray into both nostrils daily as needed for allergies or rhinitis.      Marland Kitchen. simvastatin (ZOCOR) 40 MG tablet Take 40 mg by mouth daily.      Marland Kitchen. spironolactone (ALDACTONE) 25 MG tablet Take 25 mg by mouth daily.      . metoprolol tartrate (LOPRESSOR) 25 MG tablet Take 50 mg by mouth daily.        No current facility-administered medications on file prior to visit.    Past Medical History  Diagnosis Date  . Diabetes mellitus without complication   . Hypertension   . Coronary artery disease   . MI (myocardial infarction)     Past Surgical History  Procedure Laterality Date  . Coronary stent placement    . Abdominal hysterectomy      History   Social History  . Marital Status: Single    Spouse Name: N/A    Number of Children: N/A  . Years of Education: N/A   Occupational History  . Not on file.   Social History Main Topics  . Smoking status: Never Smoker   . Smokeless tobacco: Not on file  . Alcohol Use: No  . Drug Use: No  . Sexual Activity: Not on file   Other Topics Concern  . Not on file   Social History Narrative  . No narrative on file  Family History  Problem Relation Age of Onset  . Aortic stenosis Daughter     ROS: Her major complaint is dyspnea. There may also be a component of orthopnea. She has not noted edema. She denies neurological symptoms. She denies syncope, wheeze, phlegm production, and tachycardia.. Other systems negative for complaints.  OBJECTIVE: BP 190/95  Pulse 72  Ht 5\' 6"  (1.676 m)  Wt 176 lb (79.833 kg)  BMI 28.42 kg/m2,  General: No acute distress, who appears her stated age HEENT: normal without jaundice or pallor Neck: JVD mildly elevated while lying at 45. Carotids 2+ and symmetric Chest: Clear Cardiac: Murmur:  1/6 systolic murmur. Gallop: S4. Rhythm: Regular. Other: Normal Abdomen: Bruit: Absent. Pulsation: None Extremities: Edema: Absent. Pulses: 2+ and bounding upper and lower extremities Neuro: Normal Psych: Normal  ECG: Not repeated

## 2013-07-03 ENCOUNTER — Telehealth: Payer: Self-pay | Admitting: Interventional Cardiology

## 2013-07-03 NOTE — Telephone Encounter (Signed)
[facility]

## 2013-07-03 NOTE — Telephone Encounter (Signed)
[facility] gave to Operator Room For 4.29 appt W/ Dr. Katrinka BlazingSmith

## 2013-07-17 ENCOUNTER — Ambulatory Visit: Payer: BC Managed Care – PPO | Admitting: Cardiovascular Disease

## 2013-07-26 ENCOUNTER — Ambulatory Visit (HOSPITAL_COMMUNITY): Payer: BC Managed Care – PPO

## 2013-07-27 ENCOUNTER — Telehealth: Payer: Self-pay | Admitting: Interventional Cardiology

## 2013-07-27 NOTE — Telephone Encounter (Signed)
pt adv that ref to [facility] faxed this afternoon.pt verbalzied understanding

## 2013-07-27 NOTE — Telephone Encounter (Signed)
New message          Pt would like to know if it ok for her to start cardiac rehab. Please give pt a call.

## 2013-08-01 ENCOUNTER — Encounter (HOSPITAL_COMMUNITY): Payer: BC Managed Care – PPO

## 2013-08-02 ENCOUNTER — Encounter (HOSPITAL_COMMUNITY)
Admission: RE | Admit: 2013-08-02 | Discharge: 2013-08-02 | Disposition: A | Discharge status: Home / Self Care | Payer: BC Managed Care – PPO | Source: Ambulatory Visit | Attending: Interventional Cardiology | Admitting: Interventional Cardiology

## 2013-08-02 ENCOUNTER — Ambulatory Visit (INDEPENDENT_AMBULATORY_CARE_PROVIDER_SITE_OTHER): Payer: BC Managed Care – PPO | Admitting: Interventional Cardiology

## 2013-08-02 ENCOUNTER — Encounter: Payer: Self-pay | Admitting: Interventional Cardiology

## 2013-08-02 VITALS — BP 122/80 | HR 60 | Ht 65.25 in | Wt 177.0 lb

## 2013-08-02 DIAGNOSIS — K219 Gastro-esophageal reflux disease without esophagitis: Secondary | ICD-10-CM

## 2013-08-02 DIAGNOSIS — E1165 Type 2 diabetes mellitus with hyperglycemia: Secondary | ICD-10-CM

## 2013-08-02 DIAGNOSIS — I10 Essential (primary) hypertension: Secondary | ICD-10-CM

## 2013-08-02 DIAGNOSIS — I2510 Atherosclerotic heart disease of native coronary artery without angina pectoris: Secondary | ICD-10-CM

## 2013-08-02 DIAGNOSIS — IMO0002 Reserved for concepts with insufficient information to code with codable children: Secondary | ICD-10-CM

## 2013-08-02 DIAGNOSIS — IMO0001 Reserved for inherently not codable concepts without codable children: Secondary | ICD-10-CM

## 2013-08-02 NOTE — Patient Instructions (Signed)
Your physician recommends that you continue on your current medications as directed. Please refer to the Current Medication list given to you today.  Ok to resume taking Prilosec  You have a follow up appointment scheduled for 11/06/13 @ 3pm

## 2013-08-02 NOTE — Progress Notes (Signed)
Cardiac Rehab Medication Review by a Pharmacist  Does the patient  feel that his/her medications are working for him/her?  yes  Has the patient been experiencing any side effects to the medications prescribed?  no  Does the patient measure his/her own blood pressure or blood glucose at home?  yes   Does the patient have any problems obtaining medications due to transportation or finances?   no  Understanding of regimen: good Understanding of indications: good Potential of compliance: good    Pharmacist comments: Patient has no further questions for me and is optimistic about starting cardiac rehab to help decrease the number of her medications.    Anabel Bene, PharmD Clinical Pharmacist Pager: [phone] Anabel Bene 08/02/2013 8:15 AM

## 2013-08-02 NOTE — Progress Notes (Signed)
Patient ID: Dawn Wood, female   DOB: 19-Mar-1945, 68 y.o.   MRN: [id number]    [address] [place], [place]  [address] Phone: [phone] Fax:  [phone]  Date:  08/02/2013   ID:  Dawn Wood, DOB Sep 26, 1945, MRN [id number]  PCP:  Pcp Not In System   ASSESSMENT:  1. Dyspnea improved with discontinuation of Brilinta and substituting with Plavix 2. Coronary artery disease with LAD DES placed in March, stable without angina 3. Reflux, stable on Prilosec 4. Hypertension better controlled after adding amlodipine  PLAN:  1. enroll in phase II cardiac rehabilitation 2. Return to work 3. Call of chest discomfort 4. Clinical followup in 4 months   SUBJECTIVE: Dawn Wood is a 68 y.o. female has had dramatic improvement with resolution of dyspnea after discontinuation of [place]   Wt Readings from Last 3 Encounters:  08/02/13 177 lb (80.287 kg)  08/02/13 177 lb 0.5 oz (80.3 kg)  07/02/13 176 lb (79.833 kg)     Past Medical History  Diagnosis Date  . Diabetes mellitus without complication   . Hypertension   . Coronary artery disease   . MI (myocardial infarction)     Current Outpatient Prescriptions  Medication Sig Dispense Refill  . amLODipine (NORVASC) 5 MG tablet Take 1 tablet (5 mg total) by mouth daily.  180 tablet  3  . aspirin 81 MG tablet Take 81 mg by mouth daily.      . Canagliflozin (INVOKANA) 100 MG TABS Take 100 mg by mouth daily.      . Cholecalciferol (VITAMIN D) 2000 UNITS CAPS Take 1 capsule by mouth daily.      . clopidogrel (PLAVIX) 75 MG tablet Take 1 tablet (75 mg total) by mouth daily.  90 tablet  3  . fluticasone (FLONASE) 50 MCG/ACT nasal spray Place 1 spray into both nostrils daily as needed for allergies or rhinitis.      Marland Kitchen glipiZIDE (GLUCOTROL) 10 MG tablet Take 10 mg by mouth daily before breakfast.      . losartan (COZAAR) 100 MG tablet Take 100 mg by mouth daily.      . metFORMIN (GLUCOPHAGE) 1000 MG tablet Take  1,000 mg by mouth 2 (two) times daily with a meal.      . metoprolol succinate (TOPROL-XL) 100 MG 24 hr tablet Take 1 tablet (100 mg total) by mouth daily. Take with or immediately following a meal.      . nitroGLYCERIN (NITROSTAT) 0.4 MG SL tablet Place 0.4 mg under the tongue every 5 (five) minutes as needed for chest pain.      Marland Kitchen omeprazole (PRILOSEC) 20 MG capsule Take 20 mg by mouth daily.      . simvastatin (ZOCOR) 40 MG tablet Take 40 mg by mouth daily.      Marland Kitchen spironolactone (ALDACTONE) 25 MG tablet Take 25 mg by mouth 2 (two) times daily.        No current facility-administered medications for this visit.    Allergies:    Allergies  Allergen Reactions  . Pollen Extract     Social History:  The patient  reports that she has never smoked. She does not have any smokeless tobacco history on file. She reports that she does not drink alcohol or use illicit drugs.   ROS:  Please see the history of present illness.      All other systems reviewed and negative.   OBJECTIVE: VS:  BP 122/80  Pulse 60  Ht 5' 5.25" (1.657 m)  Wt 177 lb (80.287 kg)  BMI 29.24 kg/m2 Well nourished, well developed, in no acute distress, healthy HEENT: normal Neck: JVD flat. Carotid bruit absent  Cardiac:  normal S1, S2; RRR; no murmur Lungs:  clear to auscultation bilaterally, no wheezing, rhonchi or rales Abd: soft, nontender, no hepatomegaly Ext: Edema absent. Pulses 2+ symmetric Skin: warm and dry Neuro:  CNs 2-12 intact, no focal abnormalities noted  EKG:  Not repeated       Signed, Darci NeedleHenry W. B. Louvina Cleary III, MD 08/02/2013 4:05 PM

## 2013-08-03 ENCOUNTER — Encounter (HOSPITAL_COMMUNITY): Payer: BC Managed Care – PPO

## 2013-08-06 ENCOUNTER — Encounter (HOSPITAL_COMMUNITY)
Admission: RE | Admit: 2013-08-06 | Discharge: 2013-08-06 | Disposition: A | Discharge status: Home / Self Care | Payer: BC Managed Care – PPO | Source: Ambulatory Visit | Attending: Interventional Cardiology | Admitting: Interventional Cardiology

## 2013-08-06 ENCOUNTER — Telehealth: Payer: Self-pay | Admitting: Interventional Cardiology

## 2013-08-06 DIAGNOSIS — Z9861 Coronary angioplasty status: Secondary | ICD-10-CM | POA: Insufficient documentation

## 2013-08-06 DIAGNOSIS — I214 Non-ST elevation (NSTEMI) myocardial infarction: Secondary | ICD-10-CM | POA: Insufficient documentation

## 2013-08-06 LAB — GLUCOSE, CAPILLARY
Glucose-Capillary: 187 mg/dL — ABNORMAL HIGH (ref 70–99)
Glucose-Capillary: 148 mg/dL — ABNORMAL HIGH (ref 70–99)

## 2013-08-06 NOTE — Progress Notes (Addendum)
Patient here for first day of exercise at cardiac rehab. Entry blood pressure 132/70.  Patient tolerated exercise without complaints. Blood pressure noted at 186/74 on the airdyne. Patient was switched to the track. Subsequent blood pressures improved. Exit blood pressure 126/60. Will fax exercise flow sheets to Dr. Michaelle Copas office for review with today's blood pressures. Dawn Wood took her medications today as prescribed. Will continue to monitor the patient throughout  the program. Telemetry rhythm Sinus with arrythmia. Today was also Dawn Wood first day back to work.

## 2013-08-06 NOTE — Telephone Encounter (Signed)
Records rec Via mail From Dr.Kahn Office gave to Operator Room 6.1.15/km

## 2013-08-08 ENCOUNTER — Encounter (HOSPITAL_COMMUNITY)
Admission: RE | Admit: 2013-08-08 | Discharge: 2013-08-08 | Disposition: A | Discharge status: Home / Self Care | Payer: BC Managed Care – PPO | Source: Ambulatory Visit | Attending: Interventional Cardiology | Admitting: Interventional Cardiology

## 2013-08-08 LAB — GLUCOSE, CAPILLARY
Glucose-Capillary: 98 mg/dL (ref 70–99)
Glucose-Capillary: 131 mg/dL — ABNORMAL HIGH (ref 70–99)

## 2013-08-10 ENCOUNTER — Encounter (HOSPITAL_COMMUNITY)
Admission: RE | Admit: 2013-08-10 | Discharge: 2013-08-10 | Disposition: A | Discharge status: Home / Self Care | Payer: BC Managed Care – PPO | Source: Ambulatory Visit | Attending: Interventional Cardiology | Admitting: Interventional Cardiology

## 2013-08-10 LAB — GLUCOSE, CAPILLARY
Glucose-Capillary: 140 mg/dL — ABNORMAL HIGH (ref 70–99)
Glucose-Capillary: 108 mg/dL — ABNORMAL HIGH (ref 70–99)

## 2013-08-13 ENCOUNTER — Encounter (HOSPITAL_COMMUNITY)
Admission: RE | Admit: 2013-08-13 | Discharge: 2013-08-13 | Disposition: A | Discharge status: Home / Self Care | Payer: BC Managed Care – PPO | Source: Ambulatory Visit | Attending: Interventional Cardiology | Admitting: Interventional Cardiology

## 2013-08-13 LAB — GLUCOSE, CAPILLARY
Glucose-Capillary: 202 mg/dL — ABNORMAL HIGH (ref 70–99)
Glucose-Capillary: 107 mg/dL — ABNORMAL HIGH (ref 70–99)
Glucose-Capillary: 117 mg/dL — ABNORMAL HIGH (ref 70–99)

## 2013-08-13 NOTE — Progress Notes (Signed)
Reviewed home exercise with pt today.  Pt plans to walk at home for exercise.  Reviewed THR, pulse, RPE, sign and symptoms, NTG use, and when to call 911 or MD.  Pt voiced understanding.  We will have to stay on her about doing her home exercise. Fabio Pierce, MA, ACSM RCEP

## 2013-08-13 NOTE — Progress Notes (Addendum)
Patient reported feeling lightheaded on the recumbent bike today at cardiac rehab. Blood pressure 174/78.  Pre exercise CBG 202. [name] said she ate lunch today at 11:30pm. CBG 107 Patient given a banana graham crackers and lemonade. Recheck blood pressure 142/72.  Recheck CBG 117. Patient symptom's resolved after she took ate the snack. Dawn Wood [facility] called and notified about elevated blood pressure and patient's complaints of feeling dizzy. Exit blood pressure 122/60 heart rate 60. Will fax exercise flow sheets to Dr. Michaelle Copas office for review. Encouraged patient to eat a snack prior to coming to exercise after she gets off work. Patient states understanding.

## 2013-08-15 ENCOUNTER — Encounter (HOSPITAL_COMMUNITY)
Admission: RE | Admit: 2013-08-15 | Discharge: 2013-08-15 | Disposition: A | Discharge status: Home / Self Care | Payer: BC Managed Care – PPO | Source: Ambulatory Visit | Attending: Interventional Cardiology | Admitting: Interventional Cardiology

## 2013-08-15 LAB — GLUCOSE, CAPILLARY
Glucose-Capillary: 167 mg/dL — ABNORMAL HIGH (ref 70–99)
Glucose-Capillary: 153 mg/dL — ABNORMAL HIGH (ref 70–99)

## 2013-08-17 ENCOUNTER — Encounter (HOSPITAL_COMMUNITY): Payer: BC Managed Care – PPO

## 2013-08-17 ENCOUNTER — Telehealth (HOSPITAL_COMMUNITY): Payer: Self-pay

## 2013-08-20 ENCOUNTER — Encounter (HOSPITAL_COMMUNITY)
Admission: RE | Admit: 2013-08-20 | Discharge: 2013-08-20 | Disposition: A | Discharge status: Home / Self Care | Payer: BC Managed Care – PPO | Source: Ambulatory Visit | Attending: Interventional Cardiology | Admitting: Interventional Cardiology

## 2013-08-20 LAB — GLUCOSE, CAPILLARY
Glucose-Capillary: 163 mg/dL — ABNORMAL HIGH (ref 70–99)
Glucose-Capillary: 161 mg/dL — ABNORMAL HIGH (ref 70–99)

## 2013-08-20 NOTE — Progress Notes (Signed)
Dawn Wood 68 y.o. female Nutrition Note Spoke with pt.  Nutrition Plan and Nutrition Survey goals reviewed with pt. Pt is following Step 2 of the Therapeutic Lifestyle Changes diet. Pt wants to lose wt. Pt has not been actively trying to lose wt. Pt states "I've gained wt." Pt reports losing wt after hospitalization and then gaining the wt she lost back. Wt loss tips reviewed.  Pt is diabetic. No recent A1c noted. Pt reports her Endocrinologist added Invokana and changed her Metformin ER to Metformin due to elevated CBG's. Pt c/o fluctuating CBG's since MI. Per discussion, pt reports her MD said she may need to go on insulin if her CBG's didn't improve with medication changes. Pt states she does not want to go on insulin and she has been on insulin previously. Pt feelings about using insulin discussed. This Clinical research associatewriter went over Diabetes Education test results. Pt expressed understanding of the information reviewed. Pt aware of nutrition education classes offered and may be able to attend some nutrition classes.  Nutrition Diagnosis   Food-and nutrition-related knowledge deficit related to lack of exposure to information as related to diagnosis of: ? CVD ? DM   Overweight related to excessive energy intake as evidenced by a BMI of 29.2  Nutrition RX/ Estimated Daily Nutrition Needs for: wt loss  1300-1500 Kcal, 35-40 gm fat, 9-12 gm sat fat, 1.2-1.5 gm trans-fat, <1500 mg sodium, 150-175 gm CHO   Nutrition Intervention   Pt's individual nutrition plan reviewed with pt.   Benefits of adopting Therapeutic Lifestyle Changes discussed when Medficts reviewed.   Pt to attend the Portion Distortion class   Pt to attend the  ? Nutrition I class                          ? Nutrition II class        ? Diabetes Blitz class       ? Diabetes Q & A class   Pt given handouts for: ? Nutrition I class ? Nutrition II class ? Diabetes Blitz class   Continue client-centered nutrition education by RD, as part of  interdisciplinary care. Goal(s)   Pt to identify food quantities necessary to achieve: ? wt loss to a goal wt of 152-170 lb (69.4-77.6 kg) at graduation from cardiac rehab.    CBG concentrations in the normal range or as close to normal as is safely possible.   Use pre-meal and post-meal CBG's and A1c to determine whether adjustments in food/meal planning will be beneficial or if any meds need to be combined with nutrition therapy. Monitor and Evaluate progress toward nutrition goal with team. Nutrition Risk: Change to Moderate Dawn Wood, M.Ed, RD, LDN, CDE 08/20/2013 4:05 PM

## 2013-08-22 ENCOUNTER — Encounter (HOSPITAL_COMMUNITY)
Admission: RE | Admit: 2013-08-22 | Discharge: 2013-08-22 | Disposition: A | Discharge status: Home / Self Care | Payer: BC Managed Care – PPO | Source: Ambulatory Visit | Attending: Interventional Cardiology | Admitting: Interventional Cardiology

## 2013-08-22 LAB — GLUCOSE, CAPILLARY
Glucose-Capillary: 172 mg/dL — ABNORMAL HIGH (ref 70–99)
Glucose-Capillary: 156 mg/dL — ABNORMAL HIGH (ref 70–99)

## 2013-08-24 ENCOUNTER — Encounter (HOSPITAL_COMMUNITY)
Admission: RE | Admit: 2013-08-24 | Discharge: 2013-08-24 | Disposition: A | Discharge status: Home / Self Care | Payer: BC Managed Care – PPO | Source: Ambulatory Visit | Attending: Interventional Cardiology | Admitting: Interventional Cardiology

## 2013-08-24 LAB — GLUCOSE, CAPILLARY: Glucose-Capillary: 131 mg/dL — ABNORMAL HIGH (ref 70–99)

## 2013-08-27 ENCOUNTER — Encounter (HOSPITAL_COMMUNITY)
Admission: RE | Admit: 2013-08-27 | Discharge: 2013-08-27 | Disposition: A | Discharge status: Home / Self Care | Payer: BC Managed Care – PPO | Source: Ambulatory Visit | Attending: Interventional Cardiology | Admitting: Interventional Cardiology

## 2013-08-27 LAB — GLUCOSE, CAPILLARY: Glucose-Capillary: 212 mg/dL — ABNORMAL HIGH (ref 70–99)

## 2013-08-29 ENCOUNTER — Other Ambulatory Visit: Payer: Self-pay | Admitting: Family Medicine

## 2013-08-29 ENCOUNTER — Encounter (HOSPITAL_COMMUNITY)
Admission: RE | Admit: 2013-08-29 | Discharge: 2013-08-29 | Disposition: A | Discharge status: Home / Self Care | Payer: BC Managed Care – PPO | Source: Ambulatory Visit | Attending: Interventional Cardiology | Admitting: Interventional Cardiology

## 2013-08-29 DIAGNOSIS — E2839 Other primary ovarian failure: Secondary | ICD-10-CM

## 2013-08-29 LAB — GLUCOSE, CAPILLARY: Glucose-Capillary: 156 mg/dL — ABNORMAL HIGH (ref 70–99)

## 2013-08-31 ENCOUNTER — Encounter (HOSPITAL_COMMUNITY): Payer: BC Managed Care – PPO

## 2013-09-03 ENCOUNTER — Encounter (HOSPITAL_COMMUNITY)
Admission: RE | Admit: 2013-09-03 | Discharge: 2013-09-03 | Disposition: A | Discharge status: Home / Self Care | Payer: BC Managed Care – PPO | Source: Ambulatory Visit | Attending: Interventional Cardiology | Admitting: Interventional Cardiology

## 2013-09-03 LAB — GLUCOSE, CAPILLARY
Glucose-Capillary: 166 mg/dL — ABNORMAL HIGH (ref 70–99)
Glucose-Capillary: 135 mg/dL — ABNORMAL HIGH (ref 70–99)

## 2013-09-05 ENCOUNTER — Encounter (HOSPITAL_COMMUNITY): Payer: BC Managed Care – PPO

## 2013-09-10 ENCOUNTER — Telehealth (HOSPITAL_COMMUNITY): Payer: Self-pay | Admitting: *Deleted

## 2013-09-10 ENCOUNTER — Encounter (HOSPITAL_COMMUNITY)
Admission: RE | Admit: 2013-09-10 | Discharge: 2013-09-10 | Disposition: A | Discharge status: Home / Self Care | Payer: BC Managed Care – PPO | Source: Ambulatory Visit | Attending: Interventional Cardiology | Admitting: Interventional Cardiology

## 2013-09-10 DIAGNOSIS — Z9861 Coronary angioplasty status: Secondary | ICD-10-CM | POA: Insufficient documentation

## 2013-09-10 DIAGNOSIS — I214 Non-ST elevation (NSTEMI) myocardial infarction: Secondary | ICD-10-CM | POA: Insufficient documentation

## 2013-09-10 LAB — GLUCOSE, CAPILLARY: Glucose-Capillary: 152 mg/dL — ABNORMAL HIGH (ref 70–99)

## 2013-09-10 NOTE — Progress Notes (Signed)
Patient reports feeling dizzy during exercise today. Telemetry rhythm Sinus 74. Exercise stopped CBG 152.  Patient given lemonade. Sitting blood pressure 138/80. Standing blood pressure 130/80. Ms Dawn Wood  Admitted that she has not been drinking enough water and did not sleep well last night.  Encouraged patient to drink water. Patient  Drank a cup of water in addition to the lemonade. Symptoms resolved upon exit from cardiac rehab. Exit blood pressure 132/80 heart rate 65. Will continue to monitor the patient throughout  the program.

## 2013-09-12 ENCOUNTER — Encounter (HOSPITAL_COMMUNITY)
Admission: RE | Admit: 2013-09-12 | Discharge: 2013-09-12 | Disposition: A | Discharge status: Home / Self Care | Payer: BC Managed Care – PPO | Source: Ambulatory Visit | Attending: Interventional Cardiology | Admitting: Interventional Cardiology

## 2013-09-12 NOTE — Progress Notes (Signed)
CBG 94 on the walking track. Patient given graham crackers peanut butter and lemonade. Repeat CBG 121. Patient asymptomatic.

## 2013-09-13 LAB — GLUCOSE, CAPILLARY
Glucose-Capillary: 121 mg/dL — ABNORMAL HIGH (ref 70–99)
Glucose-Capillary: 94 mg/dL (ref 70–99)

## 2013-09-14 ENCOUNTER — Encounter (HOSPITAL_COMMUNITY): Payer: BC Managed Care – PPO

## 2013-09-17 ENCOUNTER — Encounter (HOSPITAL_COMMUNITY)
Admission: RE | Admit: 2013-09-17 | Discharge: 2013-09-17 | Disposition: A | Discharge status: Home / Self Care | Payer: BC Managed Care – PPO | Source: Ambulatory Visit | Attending: Interventional Cardiology | Admitting: Interventional Cardiology

## 2013-09-17 LAB — GLUCOSE, CAPILLARY
Glucose-Capillary: 108 mg/dL — ABNORMAL HIGH (ref 70–99)
Glucose-Capillary: 131 mg/dL — ABNORMAL HIGH (ref 70–99)

## 2013-09-19 ENCOUNTER — Encounter (HOSPITAL_COMMUNITY)
Admission: RE | Admit: 2013-09-19 | Discharge: 2013-09-19 | Disposition: A | Discharge status: Home / Self Care | Payer: BC Managed Care – PPO | Source: Ambulatory Visit | Attending: Interventional Cardiology | Admitting: Interventional Cardiology

## 2013-09-19 LAB — GLUCOSE, CAPILLARY
Glucose-Capillary: 151 mg/dL — ABNORMAL HIGH (ref 70–99)
Glucose-Capillary: 174 mg/dL — ABNORMAL HIGH (ref 70–99)

## 2013-09-20 ENCOUNTER — Encounter (INDEPENDENT_AMBULATORY_CARE_PROVIDER_SITE_OTHER): Payer: Self-pay

## 2013-09-20 ENCOUNTER — Ambulatory Visit
Admission: RE | Admit: 2013-09-20 | Discharge: 2013-09-20 | Disposition: A | Discharge status: Home / Self Care | Payer: BC Managed Care – PPO | Source: Ambulatory Visit | Attending: Family Medicine | Admitting: Family Medicine

## 2013-09-20 DIAGNOSIS — E2839 Other primary ovarian failure: Secondary | ICD-10-CM

## 2013-09-21 ENCOUNTER — Encounter (HOSPITAL_COMMUNITY): Payer: BC Managed Care – PPO

## 2013-09-24 ENCOUNTER — Encounter (HOSPITAL_COMMUNITY): Admission: RE | Admit: 2013-09-24 | Payer: BC Managed Care – PPO | Source: Ambulatory Visit

## 2013-09-26 ENCOUNTER — Encounter (HOSPITAL_COMMUNITY)
Admission: RE | Admit: 2013-09-26 | Discharge: 2013-09-26 | Disposition: A | Discharge status: Home / Self Care | Payer: BC Managed Care – PPO | Source: Ambulatory Visit | Attending: Interventional Cardiology | Admitting: Interventional Cardiology

## 2013-09-26 LAB — GLUCOSE, CAPILLARY
Glucose-Capillary: 196 mg/dL — ABNORMAL HIGH (ref 70–99)
Glucose-Capillary: 164 mg/dL — ABNORMAL HIGH (ref 70–99)

## 2013-09-28 ENCOUNTER — Encounter (HOSPITAL_COMMUNITY): Payer: BC Managed Care – PPO

## 2013-10-01 ENCOUNTER — Encounter (HOSPITAL_COMMUNITY)
Admission: RE | Admit: 2013-10-01 | Discharge: 2013-10-01 | Disposition: A | Discharge status: Home / Self Care | Payer: BC Managed Care – PPO | Source: Ambulatory Visit | Attending: Interventional Cardiology | Admitting: Interventional Cardiology

## 2013-10-01 LAB — GLUCOSE, CAPILLARY
Glucose-Capillary: 205 mg/dL — ABNORMAL HIGH (ref 70–99)
Glucose-Capillary: 163 mg/dL — ABNORMAL HIGH (ref 70–99)

## 2013-10-03 ENCOUNTER — Encounter (HOSPITAL_COMMUNITY)
Admission: RE | Admit: 2013-10-03 | Discharge: 2013-10-03 | Disposition: A | Discharge status: Home / Self Care | Payer: BC Managed Care – PPO | Source: Ambulatory Visit | Attending: Interventional Cardiology | Admitting: Interventional Cardiology

## 2013-10-03 LAB — GLUCOSE, CAPILLARY
Glucose-Capillary: 171 mg/dL — ABNORMAL HIGH (ref 70–99)
Glucose-Capillary: 124 mg/dL — ABNORMAL HIGH (ref 70–99)

## 2013-10-05 ENCOUNTER — Encounter (HOSPITAL_COMMUNITY): Payer: BC Managed Care – PPO

## 2013-10-08 ENCOUNTER — Encounter (HOSPITAL_COMMUNITY)
Admission: RE | Admit: 2013-10-08 | Discharge: 2013-10-08 | Disposition: A | Discharge status: Home / Self Care | Payer: BC Managed Care – PPO | Source: Ambulatory Visit | Attending: Interventional Cardiology | Admitting: Interventional Cardiology

## 2013-10-08 DIAGNOSIS — I214 Non-ST elevation (NSTEMI) myocardial infarction: Secondary | ICD-10-CM | POA: Diagnosis present

## 2013-10-08 DIAGNOSIS — Z9861 Coronary angioplasty status: Secondary | ICD-10-CM | POA: Diagnosis present

## 2013-10-09 LAB — GLUCOSE, CAPILLARY: Glucose-Capillary: 168 mg/dL — ABNORMAL HIGH (ref 70–99)

## 2013-10-10 ENCOUNTER — Encounter (HOSPITAL_COMMUNITY): Payer: BC Managed Care – PPO

## 2013-10-10 ENCOUNTER — Telehealth (HOSPITAL_COMMUNITY): Payer: Self-pay

## 2013-10-12 ENCOUNTER — Encounter (HOSPITAL_COMMUNITY): Payer: BC Managed Care – PPO

## 2013-10-15 ENCOUNTER — Encounter (HOSPITAL_COMMUNITY)
Admission: RE | Admit: 2013-10-15 | Discharge: 2013-10-15 | Disposition: A | Discharge status: Home / Self Care | Payer: BC Managed Care – PPO | Source: Ambulatory Visit | Attending: Interventional Cardiology | Admitting: Interventional Cardiology

## 2013-10-15 DIAGNOSIS — I214 Non-ST elevation (NSTEMI) myocardial infarction: Secondary | ICD-10-CM | POA: Diagnosis not present

## 2013-10-15 LAB — GLUCOSE, CAPILLARY
Glucose-Capillary: 173 mg/dL — ABNORMAL HIGH (ref 70–99)
Glucose-Capillary: 127 mg/dL — ABNORMAL HIGH (ref 70–99)

## 2013-10-17 ENCOUNTER — Encounter (HOSPITAL_COMMUNITY)
Admission: RE | Admit: 2013-10-17 | Discharge: 2013-10-17 | Disposition: A | Discharge status: Home / Self Care | Payer: BC Managed Care – PPO | Source: Ambulatory Visit | Attending: Interventional Cardiology | Admitting: Interventional Cardiology

## 2013-10-17 DIAGNOSIS — I214 Non-ST elevation (NSTEMI) myocardial infarction: Secondary | ICD-10-CM | POA: Diagnosis not present

## 2013-10-18 LAB — GLUCOSE, CAPILLARY
Glucose-Capillary: 201 mg/dL — ABNORMAL HIGH (ref 70–99)
Glucose-Capillary: 152 mg/dL — ABNORMAL HIGH (ref 70–99)

## 2013-10-19 ENCOUNTER — Encounter (HOSPITAL_COMMUNITY): Payer: BC Managed Care – PPO

## 2013-10-22 ENCOUNTER — Encounter (HOSPITAL_COMMUNITY): Payer: BC Managed Care – PPO

## 2013-10-22 ENCOUNTER — Telehealth (HOSPITAL_COMMUNITY): Payer: Self-pay

## 2013-10-22 ENCOUNTER — Other Ambulatory Visit: Payer: Self-pay | Admitting: Family Medicine

## 2013-10-22 DIAGNOSIS — R0989 Other specified symptoms and signs involving the circulatory and respiratory systems: Secondary | ICD-10-CM

## 2013-10-24 ENCOUNTER — Encounter (HOSPITAL_COMMUNITY)
Admission: RE | Admit: 2013-10-24 | Discharge: 2013-10-24 | Disposition: A | Discharge status: Home / Self Care | Payer: BC Managed Care – PPO | Source: Ambulatory Visit | Attending: Interventional Cardiology | Admitting: Interventional Cardiology

## 2013-10-24 DIAGNOSIS — I214 Non-ST elevation (NSTEMI) myocardial infarction: Secondary | ICD-10-CM | POA: Diagnosis not present

## 2013-10-24 LAB — GLUCOSE, CAPILLARY
Glucose-Capillary: 182 mg/dL — ABNORMAL HIGH (ref 70–99)
Glucose-Capillary: 170 mg/dL — ABNORMAL HIGH (ref 70–99)

## 2013-10-26 ENCOUNTER — Encounter (HOSPITAL_COMMUNITY): Payer: BC Managed Care – PPO

## 2013-10-29 ENCOUNTER — Encounter (HOSPITAL_COMMUNITY)
Admission: RE | Admit: 2013-10-29 | Discharge: 2013-10-29 | Disposition: A | Discharge status: Home / Self Care | Payer: BC Managed Care – PPO | Source: Ambulatory Visit | Attending: Interventional Cardiology | Admitting: Interventional Cardiology

## 2013-10-29 DIAGNOSIS — I214 Non-ST elevation (NSTEMI) myocardial infarction: Secondary | ICD-10-CM | POA: Diagnosis not present

## 2013-10-29 LAB — GLUCOSE, CAPILLARY: Glucose-Capillary: 214 mg/dL — ABNORMAL HIGH (ref 70–99)

## 2013-10-30 ENCOUNTER — Other Ambulatory Visit: Payer: BC Managed Care – PPO

## 2013-10-31 ENCOUNTER — Encounter (HOSPITAL_COMMUNITY)
Admission: RE | Admit: 2013-10-31 | Discharge: 2013-10-31 | Disposition: A | Discharge status: Home / Self Care | Payer: BC Managed Care – PPO | Source: Ambulatory Visit | Attending: Interventional Cardiology | Admitting: Interventional Cardiology

## 2013-10-31 DIAGNOSIS — I214 Non-ST elevation (NSTEMI) myocardial infarction: Secondary | ICD-10-CM | POA: Diagnosis not present

## 2013-11-01 LAB — GLUCOSE, CAPILLARY: Glucose-Capillary: 161 mg/dL — ABNORMAL HIGH (ref 70–99)

## 2013-11-02 ENCOUNTER — Encounter (HOSPITAL_COMMUNITY): Payer: BC Managed Care – PPO

## 2013-11-05 ENCOUNTER — Encounter (HOSPITAL_COMMUNITY)
Admission: RE | Admit: 2013-11-05 | Discharge: 2013-11-05 | Disposition: A | Discharge status: Home / Self Care | Payer: BC Managed Care – PPO | Source: Ambulatory Visit | Attending: Interventional Cardiology | Admitting: Interventional Cardiology

## 2013-11-05 DIAGNOSIS — I214 Non-ST elevation (NSTEMI) myocardial infarction: Secondary | ICD-10-CM | POA: Diagnosis not present

## 2013-11-05 LAB — GLUCOSE, CAPILLARY
Glucose-Capillary: 173 mg/dL — ABNORMAL HIGH (ref 70–99)
Glucose-Capillary: 122 mg/dL — ABNORMAL HIGH (ref 70–99)

## 2013-11-06 ENCOUNTER — Ambulatory Visit (INDEPENDENT_AMBULATORY_CARE_PROVIDER_SITE_OTHER): Payer: BC Managed Care – PPO | Admitting: Interventional Cardiology

## 2013-11-06 ENCOUNTER — Encounter: Payer: Self-pay | Admitting: Interventional Cardiology

## 2013-11-06 VITALS — BP 154/88 | HR 82 | Ht 65.25 in | Wt 181.4 lb

## 2013-11-06 DIAGNOSIS — IMO0001 Reserved for inherently not codable concepts without codable children: Secondary | ICD-10-CM

## 2013-11-06 DIAGNOSIS — R0600 Dyspnea, unspecified: Secondary | ICD-10-CM

## 2013-11-06 DIAGNOSIS — IMO0002 Reserved for concepts with insufficient information to code with codable children: Secondary | ICD-10-CM

## 2013-11-06 DIAGNOSIS — E785 Hyperlipidemia, unspecified: Secondary | ICD-10-CM

## 2013-11-06 DIAGNOSIS — R0989 Other specified symptoms and signs involving the circulatory and respiratory systems: Secondary | ICD-10-CM

## 2013-11-06 DIAGNOSIS — I2510 Atherosclerotic heart disease of native coronary artery without angina pectoris: Secondary | ICD-10-CM

## 2013-11-06 DIAGNOSIS — R0609 Other forms of dyspnea: Secondary | ICD-10-CM

## 2013-11-06 DIAGNOSIS — E1165 Type 2 diabetes mellitus with hyperglycemia: Secondary | ICD-10-CM

## 2013-11-06 DIAGNOSIS — I10 Essential (primary) hypertension: Secondary | ICD-10-CM

## 2013-11-06 MED ORDER — ATORVASTATIN CALCIUM 20 MG PO TABS: 20.0000 mg | ORAL_TABLET | Freq: Every day | ORAL | Status: DC

## 2013-11-06 NOTE — Progress Notes (Signed)
Patient ID: Dawn Wood, female   DOB: Jan 28, 1946, 68 y.o.   MRN: [id number]    [address] [place], [place]  [address] Phone: [phone] Fax:  [phone]  Date:  11/06/2013   ID:  Dawn Wood, DOB 01/12/1946, MRN [id number]  PCP:  Pcp Not In System   ASSESSMENT:  1. coronary artery disease with LAD stent, stable without angina 2. Potential interaction between simvastatin and amlodipine 3. Hypertension 4. Diabetes mellitus, type II  PLAN:  1. discontinue simvastatin. Start atorvastatin 20 mg per day 2. Other medications are unchanged 3. I encouraged physical activity   SUBJECTIVE: Dawn Wood is a 68 y.o. female who has no dyspnea. She is concerned about an advisory from her pharmacy that they could be an interaction between amlodipine and the current dose of simvastatin (40 mg). She has not had lightheadedness or dizziness. She denies peripheral edema. No episodes of chest pain requiring nitroglycerin. She does carry nitroglycerin with her. Dyspnea resolved after Ticagrelor.    Wt Readings from Last 3 Encounters:  08/02/13 177 lb (80.287 kg)  08/02/13 177 lb 0.5 oz (80.3 kg)  07/02/13 176 lb (79.833 kg)     Past Medical History  Diagnosis Date  . Diabetes mellitus without complication   . Hypertension   . Coronary artery disease   . MI (myocardial infarction)     Current Outpatient Prescriptions  Medication Sig Dispense Refill  . amLODipine (NORVASC) 5 MG tablet Take 1 tablet (5 mg total) by mouth daily.  180 tablet  3  . aspirin 81 MG tablet Take 81 mg by mouth daily.      . Canagliflozin (INVOKANA) 100 MG TABS Take 100 mg by mouth daily.      . Cholecalciferol (VITAMIN D) 2000 UNITS CAPS Take 1 capsule by mouth daily.      . clopidogrel (PLAVIX) 75 MG tablet Take 1 tablet (75 mg total) by mouth daily.  90 tablet  3  . fluticasone (FLONASE) 50 MCG/ACT nasal spray Place 1 spray into both nostrils daily as needed for allergies or rhinitis.       Marland Kitchen glipiZIDE (GLUCOTROL) 10 MG tablet Take 10 mg by mouth daily before breakfast.      . losartan (COZAAR) 100 MG tablet Take 100 mg by mouth daily.      . metFORMIN (GLUCOPHAGE) 1000 MG tablet Take 1,000 mg by mouth 2 (two) times daily with a meal.      . metoprolol succinate (TOPROL-XL) 100 MG 24 hr tablet Take 1 tablet (100 mg total) by mouth daily. Take with or immediately following a meal.      . nitroGLYCERIN (NITROSTAT) 0.4 MG SL tablet Place 0.4 mg under the tongue every 5 (five) minutes as needed for chest pain.      Marland Kitchen omeprazole (PRILOSEC) 20 MG capsule Take 20 mg by mouth daily.      . simvastatin (ZOCOR) 40 MG tablet Take 40 mg by mouth daily.      Marland Kitchen spironolactone (ALDACTONE) 25 MG tablet Take 25 mg by mouth 2 (two) times daily.        No current facility-administered medications for this visit.    Allergies:    Allergies  Allergen Reactions  . Pollen Extract     Social History:  The patient  reports that she has never smoked. She does not have any smokeless tobacco history on file. She reports that she does not drink alcohol or use illicit  drugs.   ROS:  Please see the history of present illness.   No blood in urine or stool. Denies palpitations.   All other systems reviewed and negative.   OBJECTIVE: VS:  Ht 5' 5.25" (1.657 m) Well nourished, well developed, in no acute distress, appears well. HEENT: normal Neck: JVD flat. Carotid bruit absent  Cardiac:  normal S1, S2; RRR; no murmur Lungs:  clear to auscultation bilaterally, no wheezing, rhonchi or rales Abd: soft, nontender, no hepatomegaly Ext: Edema none. Pulses 2+ Skin: warm and dry Neuro:  CNs 2-12 intact, no focal abnormalities noted  EKG:  Not repeated       Signed, Darci Needle III, MD 11/06/2013 3:00 PM

## 2013-11-06 NOTE — Patient Instructions (Signed)
Your physician has recommended you make the following change in your medication:  1) STOP Simvastatin 2) START Atorvastatin  daily an Rx has been sent to your pharmacy  Your physician recommends that you return for a FASTING lipid profile and Alt on 01/10/14 between 7:30am-5:15pm  Your physician wants you to follow-up in: 6 months with Dr.Smith You will receive a reminder letter in the mail two months in advance. If you don't receive a letter, please call our office to schedule the follow-up appointment.

## 2013-11-07 ENCOUNTER — Encounter (HOSPITAL_COMMUNITY)
Admission: RE | Admit: 2013-11-07 | Discharge: 2013-11-07 | Disposition: A | Discharge status: Home / Self Care | Payer: BC Managed Care – PPO | Source: Ambulatory Visit | Attending: Interventional Cardiology | Admitting: Interventional Cardiology

## 2013-11-07 DIAGNOSIS — I214 Non-ST elevation (NSTEMI) myocardial infarction: Secondary | ICD-10-CM | POA: Diagnosis not present

## 2013-11-07 DIAGNOSIS — Z9861 Coronary angioplasty status: Secondary | ICD-10-CM | POA: Diagnosis present

## 2013-11-08 ENCOUNTER — Ambulatory Visit
Admission: RE | Admit: 2013-11-08 | Discharge: 2013-11-08 | Disposition: A | Discharge status: Home / Self Care | Payer: BC Managed Care – PPO | Source: Ambulatory Visit | Attending: Family Medicine | Admitting: Family Medicine

## 2013-11-08 DIAGNOSIS — R0989 Other specified symptoms and signs involving the circulatory and respiratory systems: Secondary | ICD-10-CM

## 2013-11-08 LAB — GLUCOSE, CAPILLARY: Glucose-Capillary: 182 mg/dL — ABNORMAL HIGH (ref 70–99)

## 2013-11-09 ENCOUNTER — Encounter (HOSPITAL_COMMUNITY): Payer: BC Managed Care – PPO

## 2013-11-14 ENCOUNTER — Encounter (HOSPITAL_COMMUNITY): Payer: BC Managed Care – PPO

## 2013-11-14 ENCOUNTER — Telehealth (HOSPITAL_COMMUNITY): Payer: Self-pay

## 2013-11-16 ENCOUNTER — Encounter (HOSPITAL_COMMUNITY): Payer: BC Managed Care – PPO

## 2013-11-19 ENCOUNTER — Encounter (HOSPITAL_COMMUNITY)
Admission: RE | Admit: 2013-11-19 | Discharge: 2013-11-19 | Disposition: A | Discharge status: Home / Self Care | Payer: BC Managed Care – PPO | Source: Ambulatory Visit | Attending: Interventional Cardiology | Admitting: Interventional Cardiology

## 2013-11-19 DIAGNOSIS — I214 Non-ST elevation (NSTEMI) myocardial infarction: Secondary | ICD-10-CM | POA: Diagnosis not present

## 2013-11-19 LAB — GLUCOSE, CAPILLARY: Glucose-Capillary: 194 mg/dL — ABNORMAL HIGH (ref 70–99)

## 2013-11-21 ENCOUNTER — Encounter (HOSPITAL_COMMUNITY): Payer: BC Managed Care – PPO

## 2013-11-21 ENCOUNTER — Telehealth (HOSPITAL_COMMUNITY): Payer: Self-pay

## 2013-11-21 ENCOUNTER — Telehealth: Payer: Self-pay | Admitting: Interventional Cardiology

## 2013-11-21 DIAGNOSIS — E785 Hyperlipidemia, unspecified: Secondary | ICD-10-CM

## 2013-11-21 NOTE — Telephone Encounter (Signed)
New Message  The medicine lipitor is making her entire body ache... Please call back to discuss//sr

## 2013-11-21 NOTE — Telephone Encounter (Signed)
Needs to be on something for cholesterol. Decrease the atorvastatin to M, W, F.

## 2013-11-21 NOTE — Telephone Encounter (Signed)
pt recently started taking Atorvastatin and is complaing of body aches. pt was previously taking simvastatin and was experinecing baody aches as well. pt sts that the body aches are worse with Atovastatin. adv pt I will fwd an update to Dr.Smith and call with his recommendations, pt agreeable and verbalized understanding

## 2013-11-23 ENCOUNTER — Encounter (HOSPITAL_COMMUNITY): Payer: BC Managed Care – PPO

## 2013-11-23 MED ORDER — ATORVASTATIN CALCIUM 20 MG PO TABS: 20.0000 mg | ORAL_TABLET | ORAL | Status: DC

## 2013-11-23 NOTE — Telephone Encounter (Signed)
Pt given Dr.Smith's recommendations. Pt needs to be on something for cholesterol. Decrease the atorvastatin to M, W, F.pt verbalized understanding

## 2013-11-26 ENCOUNTER — Encounter (HOSPITAL_COMMUNITY): Admission: RE | Admit: 2013-11-26 | Payer: BC Managed Care – PPO | Source: Ambulatory Visit

## 2013-11-26 ENCOUNTER — Telehealth (HOSPITAL_COMMUNITY): Payer: Self-pay | Admitting: *Deleted

## 2013-11-28 ENCOUNTER — Encounter (HOSPITAL_COMMUNITY): Payer: BC Managed Care – PPO

## 2013-11-30 ENCOUNTER — Encounter (HOSPITAL_COMMUNITY): Payer: BC Managed Care – PPO

## 2013-12-03 ENCOUNTER — Encounter (HOSPITAL_COMMUNITY): Payer: BC Managed Care – PPO

## 2013-12-03 ENCOUNTER — Other Ambulatory Visit: Payer: Self-pay

## 2013-12-03 DIAGNOSIS — I739 Peripheral vascular disease, unspecified: Secondary | ICD-10-CM

## 2013-12-05 ENCOUNTER — Encounter (HOSPITAL_COMMUNITY): Admission: RE | Admit: 2013-12-05 | Payer: BC Managed Care – PPO | Source: Ambulatory Visit

## 2013-12-06 ENCOUNTER — Telehealth (HOSPITAL_COMMUNITY): Payer: Self-pay | Admitting: *Deleted

## 2013-12-07 ENCOUNTER — Encounter (HOSPITAL_COMMUNITY): Payer: BC Managed Care – PPO

## 2013-12-12 ENCOUNTER — Encounter: Payer: Self-pay | Admitting: Vascular Surgery

## 2013-12-13 ENCOUNTER — Ambulatory Visit (INDEPENDENT_AMBULATORY_CARE_PROVIDER_SITE_OTHER): Payer: BC Managed Care – PPO | Admitting: Vascular Surgery

## 2013-12-13 ENCOUNTER — Encounter: Payer: Self-pay | Admitting: Vascular Surgery

## 2013-12-13 ENCOUNTER — Encounter: Payer: BC Managed Care – PPO | Admitting: Vascular Surgery

## 2013-12-13 ENCOUNTER — Ambulatory Visit (HOSPITAL_COMMUNITY)
Admission: RE | Admit: 2013-12-13 | Discharge: 2013-12-13 | Disposition: A | Discharge status: Home / Self Care | Payer: BC Managed Care – PPO | Source: Ambulatory Visit | Attending: Vascular Surgery | Admitting: Vascular Surgery

## 2013-12-13 ENCOUNTER — Other Ambulatory Visit (HOSPITAL_COMMUNITY): Payer: BC Managed Care – PPO

## 2013-12-13 ENCOUNTER — Other Ambulatory Visit: Payer: Self-pay | Admitting: *Deleted

## 2013-12-13 ENCOUNTER — Other Ambulatory Visit: Payer: Self-pay | Admitting: Vascular Surgery

## 2013-12-13 VITALS — BP 180/91 | HR 72 | Ht 62.5 in | Wt 179.0 lb

## 2013-12-13 DIAGNOSIS — I739 Peripheral vascular disease, unspecified: Secondary | ICD-10-CM | POA: Insufficient documentation

## 2013-12-13 DIAGNOSIS — I70212 Atherosclerosis of native arteries of extremities with intermittent claudication, left leg: Secondary | ICD-10-CM | POA: Insufficient documentation

## 2013-12-13 NOTE — Progress Notes (Signed)
[facility] HISTORY AND PHYSICAL   History of Present Illness:  Patient is a 68 y.o. year old female who presents for evaluation of left leg pain. The patient states she has a several year history of a tightness in her left calf which occurs after walking about 30 minutes. The pain quickly dissipates after resting. She is unable to walk further. She denies any pain in her left foot. She denies any history of nonhealing ulcers. She recently had a ABI performed by her primary care physician which shows an ABI on the left side of 0.61 right ABI was 1.01. Other medical problems include coronary artery disease with previous stenting, diabetes, hypertension. Her hypertension and coronary disease have been stable. However she is currently undergoing workup by endocrinology for better control of her diabetes. She is a former smoker quit approximately 2 years ago. She is on Plavix.  Past Medical History  Diagnosis Date  . Diabetes mellitus without complication   . Hypertension   . Coronary artery disease   . MI (myocardial infarction)   . Peripheral vascular disease     Past Surgical History  Procedure Laterality Date  . Coronary stent placement    . Abdominal hysterectomy      Social History History  Substance Use Topics  . Smoking status: Former Smoker    Quit date: 11/07/2011  . Smokeless tobacco: Not on file  . Alcohol Use: Yes     Comment: occ    Family History Family History  Problem Relation Age of Onset  . Aortic stenosis Daughter   . Heart disease Daughter     before age 69  . Hypertension Mother   . Cancer Father   . Hypertension Father   . Cancer Sister     Allergies  Allergies  Allergen Reactions  . Pollen Extract      Current Outpatient Prescriptions  Medication Sig Dispense Refill  . amLODipine (NORVASC) 5 MG tablet Take 1 tablet (5 mg total) by mouth daily.  180 tablet  3  . aspirin 81 MG tablet Take 81 mg by mouth daily.       Marland Kitchen. atorvastatin (LIPITOR) 20 MG tablet Take 1 tablet (20 mg total) by mouth 3 (three) times a week. Mon,Wed,Fri      . Canagliflozin (INVOKANA) 100 MG TABS Take 100 mg by mouth daily.      . Cholecalciferol (VITAMIN D) 2000 UNITS CAPS Take 1 capsule by mouth daily.      . clopidogrel (PLAVIX) 75 MG tablet Take 1 tablet (75 mg total) by mouth daily.  90 tablet  3  . fluticasone (FLONASE) 50 MCG/ACT nasal spray Place 1 spray into both nostrils daily as needed for allergies or rhinitis.      Marland Kitchen. glipiZIDE (GLUCOTROL) 10 MG tablet Take 10 mg by mouth daily before breakfast.      . losartan (COZAAR) 100 MG tablet Take 100 mg by mouth daily.      . metFORMIN (GLUCOPHAGE) 1000 MG tablet Take 1,000 mg by mouth 2 (two) times daily with a meal.      . metoprolol succinate (TOPROL-XL) 100 MG 24 hr tablet Take 1 tablet (100 mg total) by mouth daily. Take with or immediately following a meal.      . nitroGLYCERIN (NITROSTAT) 0.4 MG SL tablet Place 0.4 mg under the tongue every 5 (five) minutes as needed for chest pain.      Marland Kitchen. omeprazole (PRILOSEC) 20 MG capsule Take 20  mg by mouth daily.      Marland Kitchen spironolactone (ALDACTONE) 25 MG tablet Take 25 mg by mouth 2 (two) times daily.        No current facility-administered medications for this visit.    ROS:   General:  No weight loss, Fever, chills  HEENT: No recent headaches, no nasal bleeding, no visual changes, no sore throat  Neurologic: No dizziness, blackouts, seizures. No recent symptoms of stroke or mini- stroke. No recent episodes of slurred speech, or temporary blindness.  Cardiac: No recent episodes of chest pain/pressure, no shortness of breath at rest.  No shortness of breath with exertion.  Denies history of atrial fibrillation or irregular heartbeat  Vascular: No history of rest pain in feet.  + history of claudication.  No history of non-healing ulcer, No history of DVT   Pulmonary: No home oxygen, no productive cough, no hemoptysis,  No asthma  or wheezing  Musculoskeletal:  [ ]  Arthritis, [ ]  Low back pain,  [ ]  Joint pain  Hematologic:No history of hypercoagulable state.  No history of easy bleeding.  No history of anemia  Gastrointestinal: No hematochezia or melena,  No gastroesophageal reflux, no trouble swallowing  Urinary: [ ]  chronic Kidney disease, [ ]  on HD - [ ]  MWF or [ ]  TTHS, [ ]  Burning with urination, [ ]  Frequent urination, [ ]  Difficulty urinating;   Skin: No rashes  Psychological: No history of anxiety,  No history of depression   Physical Examination  Filed Vitals:   12/13/13 1414  BP: 180/91  Pulse: 72  Height: 5' 2.5" (1.588 m)  Weight: 179 lb (81.194 kg)  SpO2: 100%    Body mass index is 32.2 kg/(m^2).  General:  Alert and oriented, no acute distress HEENT: Normal Neck: No bruit or JVD Pulmonary: Clear to auscultation bilaterally Cardiac: Regular Rate and Rhythm without murmur Abdomen: Soft, non-tender, non-distended, no mass Skin: No rash, few scattered reticular type varicosities left lateral knee anterior thigh bilaterally Extremity Pulses:  2+ radial, brachial, femoral bilaterally, 2+ right dorsalis pedis,  absentposterior tibial pulses bilaterally, absent left popliteal and dorsalis pedis pulse Musculoskeletal: No deformity or edema  Neurologic: Upper and lower extremity motor 5/5 and symmetric  DATA:  ABIs from [facility] radiology reviewed as above. The patient had an arterial duplex exam in our office today which shows a chronic left superficial femoral artery occlusion. I reviewed and interpreted this study.   ASSESSMENT:  Claudication left lower extremity. The patient currently does not feel really disabled by this. She is currently happy with her walking distance. She prefers conservative measures with risk factor modification and a walking program for now. I did reassure her that her lifetime risk of limb loss if she continues to refrain from smoking and her diabetes is controlled  is less than 5%. I also discussed with her the possibility of an intervention to improve her walking distance. She prefers conservative management for now.   PLAN:  Followup ABIs and office visit in 6 months.  Fabienne Bruns, MD [facility] Office: [phone] Pager: [phone]

## 2013-12-17 ENCOUNTER — Ambulatory Visit
Admission: RE | Admit: 2013-12-17 | Discharge: 2013-12-17 | Disposition: A | Discharge status: Home / Self Care | Payer: BC Managed Care – PPO | Source: Ambulatory Visit | Attending: Family Medicine | Admitting: Family Medicine

## 2013-12-17 ENCOUNTER — Other Ambulatory Visit: Payer: Self-pay | Admitting: Family Medicine

## 2013-12-17 DIAGNOSIS — R9389 Abnormal findings on diagnostic imaging of other specified body structures: Secondary | ICD-10-CM

## 2013-12-25 ENCOUNTER — Encounter: Payer: BC Managed Care – PPO | Admitting: Vascular Surgery

## 2013-12-25 ENCOUNTER — Other Ambulatory Visit (HOSPITAL_COMMUNITY): Payer: BC Managed Care – PPO

## 2014-01-09 ENCOUNTER — Telehealth: Payer: Self-pay | Admitting: Interventional Cardiology

## 2014-01-09 NOTE — Telephone Encounter (Signed)
New message     Patient calling asking can she take her medication on tomorrow. Due to fasting blood work or should she wait until after blood work has been drawn.

## 2014-01-09 NOTE — Telephone Encounter (Signed)
Pt was told to hold DM meds till after she is able to eat, pt verbalized understanding.

## 2014-01-10 ENCOUNTER — Other Ambulatory Visit (INDEPENDENT_AMBULATORY_CARE_PROVIDER_SITE_OTHER): Payer: BC Managed Care – PPO | Admitting: *Deleted

## 2014-01-10 DIAGNOSIS — E785 Hyperlipidemia, unspecified: Secondary | ICD-10-CM

## 2014-01-10 LAB — LIPID PANEL
Cholesterol: 129 mg/dL (ref 0–200)
HDL: 33.9 mg/dL — ABNORMAL LOW (ref >39.00)
LDL Cholesterol: 75 mg/dL (ref 0–99)
NonHDL: 95.1
Total CHOL/HDL Ratio: 4
Triglycerides: 102 mg/dL (ref 0.0–149.0)
VLDL: 20.4 mg/dL (ref 0.0–40.0)

## 2014-01-10 LAB — ALT: ALT: 17 U/L (ref 0–35)

## 2014-01-11 ENCOUNTER — Telehealth: Payer: Self-pay

## 2014-01-11 DIAGNOSIS — E785 Hyperlipidemia, unspecified: Secondary | ICD-10-CM

## 2014-01-11 NOTE — Telephone Encounter (Signed)
-----   Message from Lesleigh NoeHenry W Smith III, MD sent at 01/11/2014  1:36 PM EST ----- Lipids are good. Recheck 1 year

## 2014-01-15 ENCOUNTER — Telehealth: Payer: Self-pay | Admitting: Interventional Cardiology

## 2014-01-15 NOTE — Telephone Encounter (Signed)
pt sts that she injured her shouldder and her orthopedic physician has prescribed Tramadol for pain,she wanted to know if it was ok for her to take with her cardiac meds. adv her that it is ok for her to take Tramadol.pt verbalized understanding.

## 2014-01-15 NOTE — Telephone Encounter (Signed)
New problem ° ° °Pt need to speak to nurse concerning her medications. Please call pt °

## 2014-02-19 ENCOUNTER — Encounter: Payer: BC Managed Care – PPO | Attending: Endocrinology | Admitting: *Deleted

## 2014-02-19 ENCOUNTER — Encounter: Payer: Self-pay | Admitting: *Deleted

## 2014-02-19 VITALS — Ht 65.5 in | Wt 186.1 lb

## 2014-02-19 DIAGNOSIS — Z713 Dietary counseling and surveillance: Secondary | ICD-10-CM | POA: Diagnosis not present

## 2014-02-19 DIAGNOSIS — E1165 Type 2 diabetes mellitus with hyperglycemia: Secondary | ICD-10-CM | POA: Diagnosis not present

## 2014-02-19 DIAGNOSIS — Z794 Long term (current) use of insulin: Secondary | ICD-10-CM | POA: Insufficient documentation

## 2014-02-19 DIAGNOSIS — IMO0002 Reserved for concepts with insufficient information to code with codable children: Secondary | ICD-10-CM

## 2014-02-19 NOTE — Patient Instructions (Addendum)
Plan:  Aim for 2-3 Carb Choices per meal (30-45 grams) +/- 1 either way  Aim for 0-15 Carbs per snack if hungry  Include protein in moderation with your meals and snacks Consider reading food labels for Total Carbohydrate and Fat Grams of foods Consider  increasing your activity level by walking for 30 minutes daily as tolerated continue checking BG at alternate times per day as directed by MD  Continue taking medication  as directed by MD  Rinse canned fruits to get syrup off 1C potatoe salad no OTHER CARBS WITH THIS MEAL Dorice LamasSarah Lee or Natures Own reduced calorie bread(White or Wheat)  Use Spray Pam to fry, OR OLIVE OIL [employer]'s 1/2 the bun, unsweet tea with Splenda or Equal, 1/2 regular fries or child size fries  WHERE IS YOUR PROTEIN?

## 2014-02-19 NOTE — Progress Notes (Signed)
Diabetes Self-Management Education  Appt. Start Time: 0930 Appt. End Time: 1100  02/19/2014  Dawn Wood, identified by name and date of birth, is a 68 y.o. female with a diagnosis of Diabetes: Type 2.  Other people present during visit:  Patient . Patient has been diagnosed with T2Dm since 1999. He has recently become a patient of Dr. Assunta FoundB. Balan. [facility] focus of dietary intake.  ASSESSMENT  Height 5' 5.5" (1.664 m), weight 186 lb 1.6 oz (84.414 kg). Body mass index is 30.49 kg/(m^2).  Initial Visit Information:  Are you currently following a meal plan?: Yes   Are you taking your medications as prescribed?: Yes Are you checking your feet?: No   How often do you need to have someone help you when you read instructions, pamphlets, or other written materials from your doctor or pharmacy?: 1 - Never   Psychosocial:   Patient Belief/Attitude about Diabetes: Motivated to manage diabetes Self-care barriers: None Self-management support: Doctor's office, CDE visits, Family Other persons present: Patient Patient Concerns: Nutrition/Meal planning, Glycemic Control Preferred Learning Style: No preference indicated Learning Readiness: Change in progress  Complications:   Last HgB A1C per patient/outside source: 8.9 mg/dL How often do you check your blood sugar?: 3-4 times/day Fasting Blood glucose range (mg/dL):  ([id number] /dl) Postprandial Blood glucose range (mg/dL): [id number] Number of hypoglycemic episodes per month: 1 Can you tell when your blood sugar is low?: Yes Number of hyperglycemic episodes per week: 0 Have you had a dilated eye exam in the past 12 months?: No Have you had a dental exam in the past 12 months?: No  Diet Intake:  Breakfast: boiled egg, toast, 1/2 Malawiturkey sausage (boiled) , 1/2 C coffee black, Corn flakes & milk, raisin Bran 1C Lunch: Ham & Cheese sandwich, McDonald's Cheese Burger, fries, sweet tea Snack (afternoon):  apples Dinner: vegetables (boiled okra) , meat, starch  Exercise:  Exercise: ADL's  Individualized Plan for Diabetes Self-Management Training:   Learning Objective:  Patient will have a greater understanding of diabetes self-management. Patient education plan per assessed needs and concerns is to attend individual sessions for     Education Topics Reviewed with Patient Today:    Role of diet in the treatment of diabetes and the relationship between the three main macronutrients and blood glucose level, Food label reading, portion sizes and measuring food., Carbohydrate counting, Information on hints to eating out and maintain blood glucose control., Meal options for control of blood glucose level and chronic complications. Role of exercise on diabetes management, blood pressure control and cardiac health. Reviewed patients medication for diabetes, action, purpose, timing of dose and side effects. Yearly dilated eye exam, Daily foot exams, Interpreting lab values - A1C, lipid, urine microalbumina.       PATIENTS GOALS/Plan (Developed by the patient):  Nutrition: General guidelines for healthy choices and portions discussed Physical Activity:  (walk 30 minutes daily) Medications: take my medication as prescribed Reducing Risk: do foot checks daily, get labs drawn  Patient Instructions  Plan:  Aim for 2-3 Carb Choices per meal (30-45 grams) +/- 1 either way  Aim for 0-15 Carbs per snack if hungry  Include protein in moderation with your meals and snacks Consider reading food labels for Total Carbohydrate and Fat Grams of foods Consider  increasing your activity level by walking for 30 minutes daily as tolerated continue checking BG at alternate times per day as directed by MD  Continue taking medication  as directed  by MD  Rinse canned fruits to get syrup off 1C potatoe salad no OTHER CARBS WITH THIS MEAL Dorice LamasSarah Lee or Natures Own reduced calorie bread(White or Wheat)  Use Spray  Pam to fry, OR OLIVE OIL [employer]'s 1/2 the bun, unsweet tea with Splenda or Equal, 1/2 regular fries or child size fries  WHERE IS YOUR PROTEIN?   Expected Outcomes:  Demonstrated interest in learning. Expect positive outcomes  Education material provided: Living Well with Diabetes, A1C conversion sheet, Meal plan card, My Plate, Snack sheet and Support group flyer  If problems or questions, patient to contact team via:  Phone  Future DSME appointment: PRN

## 2014-03-11 ENCOUNTER — Telehealth: Payer: Self-pay | Admitting: Interventional Cardiology

## 2014-03-11 NOTE — Telephone Encounter (Signed)
Returned pt call. She has been having reoccurring nose bleeds. Adv her that  If nose bleed reoccurs she should apply pressure, right at the nose bridge until it stops..pt has had a upper respiratory infection, with a cough and runny nose. Adv her to keep the inside of her nostrils lubricated. Adv her to use a little vaseline on a q-tip, every couple of hours.adv her that if she has a nose bleed that does not stop she should got the the ED for them to pack it. She should not interrupt her plavix therapy.pt agreeable with plan and verbalized understanding

## 2014-03-11 NOTE — Telephone Encounter (Signed)
New message      Pt is on plavix and is having nosebleeds.   Please advise

## 2014-03-19 ENCOUNTER — Other Ambulatory Visit (HOSPITAL_COMMUNITY)
Admission: RE | Admit: 2014-03-19 | Discharge: 2014-03-19 | Disposition: A | Discharge status: Home / Self Care | Payer: BC Managed Care – PPO | Source: Ambulatory Visit | Attending: Family Medicine | Admitting: Family Medicine

## 2014-03-19 ENCOUNTER — Other Ambulatory Visit: Payer: Self-pay | Admitting: Family Medicine

## 2014-03-19 DIAGNOSIS — N939 Abnormal uterine and vaginal bleeding, unspecified: Secondary | ICD-10-CM | POA: Insufficient documentation

## 2014-03-21 LAB — CYTOLOGY - PAP

## 2014-05-07 ENCOUNTER — Ambulatory Visit (INDEPENDENT_AMBULATORY_CARE_PROVIDER_SITE_OTHER): Payer: BC Managed Care – PPO | Admitting: Interventional Cardiology

## 2014-05-07 ENCOUNTER — Encounter: Payer: Self-pay | Admitting: Interventional Cardiology

## 2014-05-07 VITALS — BP 152/84 | HR 55 | Ht 65.5 in | Wt 186.1 lb

## 2014-05-07 DIAGNOSIS — I739 Peripheral vascular disease, unspecified: Secondary | ICD-10-CM

## 2014-05-07 DIAGNOSIS — I2510 Atherosclerotic heart disease of native coronary artery without angina pectoris: Secondary | ICD-10-CM

## 2014-05-07 DIAGNOSIS — I10 Essential (primary) hypertension: Secondary | ICD-10-CM

## 2014-05-07 MED ORDER — VALSARTAN 320 MG PO TABS: 320.0000 mg | ORAL_TABLET | Freq: Every day | ORAL | Status: DC

## 2014-05-07 NOTE — Progress Notes (Signed)
Cardiology Office Note   Date:  05/07/2014   ID:  Dawn Wood, DOB 01/08/1946, MRN [id number]  PCP:  Cain SaupeFULP, CAMMIE, MD  Cardiologist:   Lesleigh NoeSMITH III,HENRY W, MD   Chief Complaint  Patient presents with  . Coronary Artery Disease    nose bleeds      History of Present Illness: Dawn Wood is a 69 y.o. female who presents for CAD. She has noticed some lower extremity swelling. No dyspnea orthopnea has been noted. She has not needed nitroglycerin. Right shoulder discomfort has improved. She is back at work. Overall she feels well.    Past Medical History  Diagnosis Date  . Diabetes mellitus without complication   . Hypertension   . Coronary artery disease   . MI (myocardial infarction)   . Peripheral vascular disease   . GERD (gastroesophageal reflux disease)     Past Surgical History  Procedure Laterality Date  . Coronary stent placement    . Abdominal hysterectomy       Current Outpatient Prescriptions  Medication Sig Dispense Refill  . amLODipine (NORVASC) 5 MG tablet Take 1 tablet (5 mg total) by mouth daily. 180 tablet 3  . aspirin 81 MG tablet Take 81 mg by mouth daily.    Marland Kitchen. atorvastatin (LIPITOR) 20 MG tablet Take 1 tablet (20 mg total) by mouth 3 (three) times a week. Mon,Wed,Fri    . Canagliflozin (INVOKANA) 100 MG TABS Take 100 mg by mouth daily.    . Cholecalciferol (VITAMIN D) 2000 UNITS CAPS Take 1 capsule by mouth daily.    . clopidogrel (PLAVIX) 75 MG tablet Take 1 tablet (75 mg total) by mouth daily. 90 tablet 3  . fluticasone (FLONASE) 50 MCG/ACT nasal spray Place 1 spray into both nostrils daily as needed for allergies or rhinitis.    Marland Kitchen. insulin aspart protamine- aspart (NOVOLOG MIX 70/30) (70-30) 100 UNIT/ML injection Inject 15 Units into the skin 2 (two) times daily with a meal.    . metFORMIN (GLUMETZA) 500 MG (MOD) 24 hr tablet Take 500 mg by mouth 2 (two) times daily with a meal. 1 tablets in the morning and 2 tablets in the evening    .  metoprolol succinate (TOPROL-XL) 100 MG 24 hr tablet Take 1 tablet (100 mg total) by mouth daily. Take with or immediately following a meal.    . nitroGLYCERIN (NITROSTAT) 0.4 MG SL tablet Place 0.4 mg under the tongue every 5 (five) minutes as needed for chest pain.    Marland Kitchen. omeprazole (PRILOSEC) 20 MG capsule Take 20 mg by mouth daily.    Marland Kitchen. spironolactone (ALDACTONE) 25 MG tablet Take 25 mg by mouth 2 (two) times daily.     . valsartan (DIOVAN) 320 MG tablet Take 1 tablet (320 mg total) by mouth daily. 30 tablet 11   No current facility-administered medications for this visit.    Allergies:   Pollen extract    Social History:  The patient  reports that she quit smoking about 2 years ago. She does not have any smokeless tobacco history on file. She reports that she drinks alcohol. She reports that she does not use illicit drugs.   Family History:  The patient's family history includes Aortic stenosis in her daughter; Cancer in her father and sister; Heart disease in her daughter; Hypertension in her father and mother.    ROS:  Please see the history of present illness.   Otherwise, review of systems are positive for rotator cuff  tear and biceps tendon tear..   All other systems are reviewed and negative.    PHYSICAL EXAM: VS:  BP 152/84 mmHg  Pulse 55  Ht 5' 5.5" (1.664 m)  Wt 186 lb 1.9 oz (84.423 kg)  BMI 30.49 kg/m2 , BMI Body mass index is 30.49 kg/(m^2). GEN: Well nourished, well developed, in no acute distress HEENT: normal Neck: no JVD, carotid bruits, or masses Cardiac: RRR; no murmurs, rubs, or gallops,no edema  Respiratory:  clear to auscultation bilaterally, normal work of breathing GI: soft, nontender, nondistended, + BS MS: no deformity or atrophy Skin: warm and dry, no rash Neuro:  Strength and sensation are intact Psych: euthymic mood, full affect   EKG:  EKG is ordered today. The ekg ordered today demonstrates sinus bradycardia. Otherwise normal   Recent  Labs: 06/13/2013: BUN 14; Creatinine 0.91; Hemoglobin 13.6; Platelets 258; Potassium 4.0; Pro B Natriuretic peptide (BNP) 284.4*; Sodium 142 01/10/2014: ALT 17    Lipid Panel    Component Value Date/Time   CHOL 129 01/10/2014 0935   TRIG 102.0 01/10/2014 0935   HDL 33.90* 01/10/2014 0935   CHOLHDL 4 01/10/2014 0935   VLDL 20.4 01/10/2014 0935   LDLCALC 75 01/10/2014 0935      Wt Readings from Last 3 Encounters:  05/07/14 186 lb 1.9 oz (84.423 kg)  02/19/14 186 lb 1.6 oz (84.414 kg)  12/13/13 179 lb (81.194 kg)      Other studies Reviewed: Additional studies/ records that were reviewed today include: .    ASSESSMENT AND PLAN:  CAD in native artery: She is asymptomatic from the standpoint of coronary disease. She has not needed nitroglycerin. We will continue the dual antiplatelet therapy for at least another 6 months.  PVD (peripheral vascular disease): No limitation of physical activity. Denies claudication.  Essential hypertension:  Poor systolic blood pressure control. Already on 4 agents. Plan to switch from Losartan  to valsartan 320 mg per day.  Diabetes mellitus type II with complications: Being managed by Dr. Cleon Gustin     Current medicines are reviewed at length with the patient today.  The patient does not have concerns regarding medicines.  The following changes have been made:  Because of poor blood pressure control valsartan will be used instead of losartan. She'll return in one month for blood pressure recheck. A basic metabolic panel will be done at that time. I will see her back in 6 months. In 6 months for her probably discontinue Plavix.  Labs/ tests ordered today include:   Orders Placed This Encounter  Procedures  . Basic metabolic panel  . EKG 12-Lead     Disposition:   FU with Mendel Ryder in 6 months   Signed, Lesleigh Noe, MD  05/07/2014 4:27 PM    [facility] [address] [place], [place], [place]  [address] Phone: [phone]; Fax: [phone]

## 2014-05-07 NOTE — Patient Instructions (Signed)
Your physician has recommended you make the following change in your medication:  1) STOP Losartan 2) START Valsartan(Diovan) 320mg  daily. 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 If you don't receive a letter, please call our office to schedule the follow-up appointment.

## 2014-06-13 ENCOUNTER — Encounter (HOSPITAL_COMMUNITY): Payer: BC Managed Care – PPO

## 2014-06-13 ENCOUNTER — Ambulatory Visit: Payer: BC Managed Care – PPO | Admitting: Vascular Surgery

## 2014-06-19 ENCOUNTER — Other Ambulatory Visit (INDEPENDENT_AMBULATORY_CARE_PROVIDER_SITE_OTHER): Payer: BC Managed Care – PPO | Admitting: *Deleted

## 2014-06-19 ENCOUNTER — Ambulatory Visit (INDEPENDENT_AMBULATORY_CARE_PROVIDER_SITE_OTHER): Payer: BC Managed Care – PPO | Admitting: Cardiology

## 2014-06-19 ENCOUNTER — Other Ambulatory Visit: Payer: Self-pay | Admitting: *Deleted

## 2014-06-19 ENCOUNTER — Encounter: Payer: Self-pay | Admitting: Cardiology

## 2014-06-19 VITALS — BP 140/78 | HR 68 | Ht 65.5 in | Wt 185.8 lb

## 2014-06-19 DIAGNOSIS — I739 Peripheral vascular disease, unspecified: Secondary | ICD-10-CM | POA: Diagnosis not present

## 2014-06-19 DIAGNOSIS — I10 Essential (primary) hypertension: Secondary | ICD-10-CM

## 2014-06-19 DIAGNOSIS — E785 Hyperlipidemia, unspecified: Secondary | ICD-10-CM

## 2014-06-19 DIAGNOSIS — IMO0002 Reserved for concepts with insufficient information to code with codable children: Secondary | ICD-10-CM

## 2014-06-19 DIAGNOSIS — I2510 Atherosclerotic heart disease of native coronary artery without angina pectoris: Secondary | ICD-10-CM

## 2014-06-19 DIAGNOSIS — E1165 Type 2 diabetes mellitus with hyperglycemia: Secondary | ICD-10-CM

## 2014-06-19 LAB — BASIC METABOLIC PANEL
BUN: 17 mg/dL (ref 6–23)
CO2: 26 mEq/L (ref 19–32)
Calcium: 9.5 mg/dL (ref 8.4–10.5)
Chloride: 103 mEq/L (ref 96–112)
Creatinine, Ser: 0.88 mg/dL (ref 0.40–1.20)
GFR: 81.78 mL/min (ref >60.00)
Glucose, Bld: 90 mg/dL (ref 70–99)
Potassium: 3.6 mEq/L (ref 3.5–5.1)
Sodium: 137 mEq/L (ref 135–145)

## 2014-06-19 NOTE — Assessment & Plan Note (Signed)
Better control- 138/78 by me today in the office

## 2014-06-19 NOTE — Assessment & Plan Note (Signed)
DEStent proximal LAD 05/2013-(Dr [place] BreedKahn in [place])

## 2014-06-19 NOTE — Patient Instructions (Signed)
Your physician recommends that you continue on your current medications as directed. Please refer to the Current Medication list given to you today.    Your physician wants you to follow-up in:  IN 6 MONTHS  WITH DR Marlou StarksSMITH  You will receive a reminder letter in the mail two months in advance. If you don't receive a letter, please call our office to schedule the follow-up appointment.

## 2014-06-19 NOTE — Progress Notes (Signed)
.    06/19/2014 Dawn Wood   [birth year]  [id number]  Primary Physician Cain SaupeFULP, CAMMIE, MD Primary Cardiologist: Dr Katrinka BlazingSmith  HPI:  69 y/o followed by Dr Katrinka BlazingSmith. She has a history of CAD, s/p LAD DES by Dr Park BreedKahn in [place] in March 2015. Dr Katrinka BlazingSmith just saw her in the office 05/07/14 and changed her to valsartan 320 mg. She is here today for f/u B/P and BMP.    Current Outpatient Prescriptions  Medication Sig Dispense Refill  . amLODipine (NORVASC) 5 MG tablet Take 1 tablet (5 mg total) by mouth daily. 180 tablet 3  . aspirin 81 MG tablet Take 81 mg by mouth daily.    Marland Kitchen. atorvastatin (LIPITOR) 20 MG tablet Take 1 tablet (20 mg total) by mouth 3 (three) times a week. Mon,Wed,Fri    . Canagliflozin (INVOKANA) 100 MG TABS Take 100 mg by mouth daily.    . Cholecalciferol (VITAMIN D) 2000 UNITS CAPS Take 1 capsule by mouth daily.    . clopidogrel (PLAVIX) 75 MG tablet Take 1 tablet (75 mg total) by mouth daily. 90 tablet 3  . fluticasone (FLONASE) 50 MCG/ACT nasal spray Place 1 spray into both nostrils daily as needed for allergies or rhinitis.    Marland Kitchen. insulin aspart protamine- aspart (NOVOLOG MIX 70/30) (70-30) 100 UNIT/ML injection Inject 15 Units into the skin 2 (two) times daily with a meal.    . metFORMIN (GLUMETZA) 500 MG (MOD) 24 hr tablet Take 500 mg by mouth 2 (two) times daily with a meal. 1 tablets in the morning and 2 tablets in the evening    . metoprolol succinate (TOPROL-XL) 100 MG 24 hr tablet Take 1 tablet (100 mg total) by mouth daily. Take with or immediately following a meal.    . nitroGLYCERIN (NITROSTAT) 0.4 MG SL tablet Place 0.4 mg under the tongue every 5 (five) minutes as needed for chest pain.    Marland Kitchen. omeprazole (PRILOSEC) 20 MG capsule Take 20 mg by mouth daily.    Marland Kitchen. spironolactone (ALDACTONE) 25 MG tablet Take 25 mg by mouth 2 (two) times daily.     . valsartan (DIOVAN) 320 MG tablet Take 1 tablet (320 mg total) by mouth daily. 30 tablet 11   No current facility-administered  medications for this visit.    Allergies  Allergen Reactions  . Pollen Extract Other (See Comments)    Reaction unknown    History   Social History  . Marital Status: Single    Spouse Name: N/A  . Number of Children: N/A  . Years of Education: N/A   Occupational History  . Not on file.   Social History Main Topics  . Smoking status: Former Smoker    Quit date: 11/07/2011  . Smokeless tobacco: Not on file  . Alcohol Use: Yes     Comment: occ  . Drug Use: No  . Sexual Activity: Not on file   Other Topics Concern  . Not on file   Social History Narrative     Review of Systems: General: negative for chills, fever, night sweats or weight changes.  Cardiovascular: negative for chest pain, dyspnea on exertion, edema, orthopnea, palpitations, paroxysmal nocturnal dyspnea or shortness of breath Dermatological: negative for rash Respiratory: negative for cough or wheezing Urologic: negative for hematuria Abdominal: negative for nausea, vomiting, diarrhea, bright red blood per rectum, melena, or hematemesis Neurologic: negative for visual changes, syncope, or dizziness Complains of Rt leg pain which she attributes to Lipitor. All other systems  reviewed and are otherwise negative except as noted above.    Blood pressure 140/78, pulse 68, height 5' 5.5" (1.664 m), weight 185 lb 12.8 oz (84.278 kg), SpO2 97 %.  General appearance: alert, cooperative, no distress and mildly obese Extremities: no edema, diminnished pulses bilateraly   ASSESSMENT AND PLAN:   CAD in native artery DEStent proximal LAD 05/2013-(Dr Park Breed in [place])   Essential hypertension Better control- 138/78 by me today in the office   Diabetes mellitus type II, uncontrolled Followed by PCP   PVD (peripheral vascular disease) "60% blockage" Lt leg   Dyslipidemia She cut her Lipitor back to 20 mg 3 times a week secondary to Rt leg pain- (it hasn't helped much)    PLAN  She has a follow up  with Dr Darrick Penna 06/27/14. I encouraged her to keep that appointment and ask him to evaluate her Rt leg for possible claudication. She'll continue her current medications and have a BMP today.   [facility] SapuLPa KPA-C 06/19/2014 3:59 PM

## 2014-06-19 NOTE — Assessment & Plan Note (Signed)
"  60% blockage" Lt leg

## 2014-06-19 NOTE — Assessment & Plan Note (Signed)
Followed by PCP

## 2014-06-19 NOTE — Assessment & Plan Note (Signed)
She cut her Lipitor back to 20 mg 3 times a week secondary to Rt leg pain- (it hasn't helped much)

## 2014-06-24 ENCOUNTER — Telehealth: Payer: Self-pay

## 2014-06-24 NOTE — Telephone Encounter (Signed)
-----   Message from Lyn RecordsHenry W Smith, MD sent at 06/20/2014  7:47 AM EDT ----- Labs are okay

## 2014-06-24 NOTE — Telephone Encounter (Signed)
-----   Message from Henry W Smith, MD sent at 06/20/2014  7:47 AM EDT ----- Labs are okay 

## 2014-06-24 NOTE — Telephone Encounter (Signed)
Pt aware of lab results with verbal understanding. 

## 2014-06-24 NOTE — Telephone Encounter (Signed)
Called to give pt lab results.lmtcb  

## 2014-06-26 ENCOUNTER — Encounter: Payer: Self-pay | Admitting: Vascular Surgery

## 2014-06-27 ENCOUNTER — Ambulatory Visit (INDEPENDENT_AMBULATORY_CARE_PROVIDER_SITE_OTHER): Payer: BC Managed Care – PPO | Admitting: Vascular Surgery

## 2014-06-27 ENCOUNTER — Encounter: Payer: Self-pay | Admitting: Vascular Surgery

## 2014-06-27 ENCOUNTER — Ambulatory Visit (HOSPITAL_COMMUNITY)
Admission: RE | Admit: 2014-06-27 | Discharge: 2014-06-27 | Disposition: A | Discharge status: Home / Self Care | Payer: BC Managed Care – PPO | Source: Ambulatory Visit | Attending: Vascular Surgery | Admitting: Vascular Surgery

## 2014-06-27 ENCOUNTER — Other Ambulatory Visit: Payer: Self-pay | Admitting: *Deleted

## 2014-06-27 VITALS — BP 168/88 | HR 70 | Ht 65.5 in | Wt 187.0 lb

## 2014-06-27 DIAGNOSIS — I739 Peripheral vascular disease, unspecified: Secondary | ICD-10-CM | POA: Diagnosis not present

## 2014-06-27 DIAGNOSIS — I2510 Atherosclerotic heart disease of native coronary artery without angina pectoris: Secondary | ICD-10-CM

## 2014-06-27 MED ORDER — CLOPIDOGREL BISULFATE 75 MG PO TABS: 75.0000 mg | ORAL_TABLET | Freq: Every day | ORAL | Status: DC

## 2014-06-27 NOTE — Progress Notes (Signed)
[facility] [place] HISTORY AND PHYSICAL    History of Present Illness:  Patient is a 69 y.o. year old female who presents for evaluation of left leg pain. She was last seen 6 months ago. She states she really has no symptoms whatsoever in her left leg. She occasionally has some pain in her right groin and inner thigh which she thinks is related to her previous cardiac catheterization 1 year ago.  She denies any pain in her left foot. She denies any history of nonhealing ulcers. Other medical problems include coronary artery disease with previous stenting, diabetes, hypertension. Her hypertension and coronary disease have been stable. However she states her glucose control is improved.. She is a former smoker quit approximately 2 years ago. She is on Plavix.    Past Medical History   Diagnosis  Date   .  Diabetes mellitus without complication     .  Hypertension     .  Coronary artery disease     .  MI (myocardial infarction)     .  Peripheral vascular disease         Past Surgical History   Procedure  Laterality  Date   .  Coronary stent placement       .  Abdominal hysterectomy         Social History History   Substance Use Topics   .  Smoking status:  Former Smoker       Quit date:  11/07/2011   .  Smokeless tobacco:  Not on file   .  Alcohol Use:  Yes         Comment: occ     Family History Family History   Problem  Relation  Age of Onset   .  Aortic stenosis  Daughter     .  Heart disease  Daughter         before age 81   .  Hypertension  Mother     .  Cancer  Father     .  Hypertension  Father     .  Cancer  Sister       Allergies    Allergies   Allergen  Reactions   .  Pollen Extract      Current Outpatient Prescriptions on File Prior to Visit  Medication Sig Dispense Refill  . amLODipine (NORVASC) 5 MG tablet Take 1 tablet (5 mg total) by mouth daily. 180 tablet 3  . aspirin 81 MG tablet Take 81 mg by mouth daily.    Marland Kitchen atorvastatin  (LIPITOR) 20 MG tablet Take 1 tablet (20 mg total) by mouth 3 (three) times a week. Mon,Wed,Fri    . Canagliflozin (INVOKANA) 100 MG TABS Take 100 mg by mouth daily.    . Cholecalciferol (VITAMIN D) 2000 UNITS CAPS Take 1 capsule by mouth daily.    . fluticasone (FLONASE) 50 MCG/ACT nasal spray Place 1 spray into both nostrils daily as needed for allergies or rhinitis.    Marland Kitchen insulin aspart protamine- aspart (NOVOLOG MIX 70/30) (70-30) 100 UNIT/ML injection Inject 15 Units into the skin 2 (two) times daily with a meal.    . metFORMIN (GLUMETZA) 500 MG (MOD) 24 hr tablet Take 500 mg by mouth 2 (two) times daily with a meal. 1 tablets in the morning and 2 tablets in the evening    . metoprolol succinate (TOPROL-XL) 100 MG 24 hr tablet Take 1 tablet (100 mg total) by mouth daily. Take  with or immediately following a meal.    . nitroGLYCERIN (NITROSTAT) 0.4 MG SL tablet Place 0.4 mg under the tongue every 5 (five) minutes as needed for chest pain.    Marland Kitchen. omeprazole (PRILOSEC) 20 MG capsule Take 20 mg by mouth daily.    Marland Kitchen. spironolactone (ALDACTONE) 25 MG tablet Take 25 mg by mouth 2 (two) times daily.     . valsartan (DIOVAN) 320 MG tablet Take 1 tablet (320 mg total) by mouth daily. 30 tablet 11   No current facility-administered medications on file prior to visit.      ROS:    General:  No weight loss, Fever, chills  HEENT: No recent headaches, no nasal bleeding, no visual changes, no sore throat  Neurologic: No dizziness, blackouts, seizures. No recent symptoms of stroke or mini- stroke. No recent episodes of slurred speech, or temporary blindness.  Cardiac: No recent episodes of chest pain/pressure, no shortness of breath at rest.  No shortness of breath with exertion.  Denies history of atrial fibrillation or irregular heartbeat  Vascular: No history of rest pain in feet.  + history of claudication.  No history of non-healing ulcer, No history of DVT    Pulmonary: No home oxygen, no  productive cough, no hemoptysis,  No asthma or wheezing  Musculoskeletal:  [ ]  Arthritis, [ ]  Low back pain,  [ ]  Joint pain  Hematologic:No history of hypercoagulable state.  No history of easy bleeding.  No history of anemia  Gastrointestinal: No hematochezia or melena,  No gastroesophageal reflux, no trouble swallowing  Urinary: [ ]  chronic Kidney disease, [ ]  on HD - [ ]  MWF or [ ]  TTHS, [ ]  Burning with urination, [ ]  Frequent urination, [ ]  Difficulty urinating;    Skin: No rashes  Psychological: No history of anxiety,  No history of depression   Physical Examination  Filed Vitals:   06/27/14 1549 06/27/14 1552  BP: 158/82 168/88  Pulse: 70   Height: 5' 5.5" (1.664 m)   Weight: 187 lb (84.823 kg)   SpO2: 96%     General:  Alert and oriented, no acute distress HEENT: Normal Neck: No bruit or JVD Pulmonary: Clear to auscultation bilaterally Cardiac: Regular Rate and Rhythm without murmur Abdomen: Soft, non-tender, non-distended, no mass Skin: No rash Extremity Pulses:  2+ radial, brachial, femoral bilaterally, 2+ right dorsalis pedis,  absent posterior tibial pulses bilaterally, absent left popliteal and dorsalis pedis pulse Musculoskeletal: No deformity or edema     Neurologic: Upper and lower extremity motor 5/5 and symmetric  DATA:  ABIs today 0.6 on the left 1on the right. I reviewed and interpreted this study.   ASSESSMENT:  Claudication left lower extremity essentially her left leg is asymptomatic at this point.. The patient currently does not feel really disabled by this. She is currently happy with her walking distance. She prefers conservative measures with risk factor modification and a walking program for now. I did reassure her that her lifetime risk of limb loss if she continues to refrain from smoking and her diabetes is controlled is less than 5%. I also discussed with her the possibility of an intervention to improve her walking distance. She prefers  conservative management for now.   PLAN:  Followup ABIs and office visit in 6 months.  Dawn Brunsharles Ceaira Ernster, MD [facility] [place] Office: [phone] Pager: [phone]

## 2014-06-29 NOTE — Discharge Summary (Signed)
PATIENT NAME:  Dawn NewcomerFOUST, Shanigua MR#:  [id number] DATE OF BIRTH:  [birth year]  DATE OF ADMISSION:  05/27/2013 DATE OF DISCHARGE:  05/29/2013  PRIMARY CARE PHYSICIAN: In [place].   FINAL DIAGNOSES:  1.  Acute myocardial infarction.  2.  Hypertension.  2.  Diabetes.   MEDICATIONS ON DISCHARGE: Include metformin 1000 mg twice a day, Januvia 100 mg daily, aspirin 81 mg daily, vitamin D 2000 international units daily, lisinopril 10 mg daily, nitroglycerin 0.4 mg sublingually every 5 minutes as needed for chest pain, simvastatin 40 mg at bedtime, Brilinta 90 mg twice a day, metoprolol 25 mg twice a day, spironolactone 25 mg twice a day.   Stop taking Tribenzor.   DIET: Low sodium, carbohydrate-controlled diet.   FOLLOWUP: Dr. Welton FlakesKhan this Friday at 10:00 a.m. and in 1 to 2 weeks with your medical doctor.   HOSPITAL COURSE:  The patient was admitted with chest pain. The patient's cardiac enzymes went up and cardiology consultation was obtained by Dr. Adrian BlackwaterShaukat Khan. The patient was taken to the cardiac catheter lab by Dr. Welton FlakesKhan. Dr. Juliann Paresallwood placed a stent in the 95% proximal LAD.   LABORATORY AND RADIOLOGICAL DATA DURING THE HOSPITAL COURSE: Included an EKG that showed sinus tachycardia, LVH.   Glucose 199, BUN 17, creatinine 1.21, sodium 139, potassium 3.6, chloride 109, CO2 of 28, calcium 8.5. White blood cell count 10.8, H and H 12.9 and 40.6, platelet count of 246. Troponin borderline at 0.06.   Chest x-ray negative.   Next troponin up at 1.6. Next troponin up at 3.9.   Echocardiogram showed EF 55% to 60%. Septal hypokinesis. Mid inferoseptal segment abnormal. Mildly increased left ventricular posterior wall thickness.   LDL 94, HDL 87, triglycerides 180. Creatinine upon discharge 0.82.   HOSPITAL COURSE PER PROBLEM LIST:  1.  For the patient's acute myocardial infarction, the patient was treated medically with aspirin, Lovenox, and beta blocker. The patient was brought to the cardiac  catheter lab and had a stent in the LAD. The patient was started on Brilinta. The metoprolol dose was increased to 25 mg twice a day. The patient was put on simvastatin. The patient will follow up with Dr. Welton FlakesKhan at the end of the week. A note was given off from work for 1 week.  2.  For the patient's hypertension, her Tribenzor was stopped. Metoprolol and lisinopril were  given instead.  3.  For her diabetes, her diabetic medications were held for cardiac catheterization during the hospital stay. Can restart as outpatient her metformin and Januvia.   TIME SPENT ON DISCHARGE: 35 minutes.   ____________________________ Herschell Dimesichard J. Renae GlossWieting, MD rjw:np D: 05/29/2013 15:18:16 ET T: 05/29/2013 17:40:28 ET JOB#: [id number]  cc: Herschell Dimesichard J. Renae GlossWieting, MD, <Dictator> Laurier NancyShaukat A. Khan, MD Salley ScarletICHARD J Selah Klang MD ELECTRONICALLY SIGNED 06/01/2013 16:29

## 2014-06-29 NOTE — Consult Note (Signed)
PATIENT NAME:  Dawn NewcomerFOUST, Dawn MR#:  [id number] DATE OF BIRTH:  26-Dec-1945  DATE OF CONSULTATION:  05/27/2013  CONSULTING PHYSICIAN:  Laurier NancyShaukat A. Veralyn Lopp, MD  INDICATION FOR CONSULTATION: Chest pain and elevated troponin.   HISTORY OF PRESENT ILLNESS: This is a 69 year old African American female with a past medical history of hypertension, hyperlipidemia and diabetes mellitus, who came into the hospital with chest pain. She has been having chest pain for the past 3 to 4 days. She says she had colonoscopy early this last week by Dr. Loreta AveMann and after that started having chest pain. She was told by Dr. Loreta AveMann to go to the hospital, so yesterday in the chest pain got really worse. It was in the retrosternal area, pressure-type, associated with shortness of breath and diaphoresis and feeling like she is going to pass out.   PAST MEDICAL HISTORY: As mentioned above plus acid reflux.   ALLERGIES: None.   SOCIAL HISTORY: She denies EtOH abuse or smoking.   FAMILY HISTORY: Positive for coronary artery disease. Her daughter had open heart surgery for aortic valve replacement and coronary artery disease.   PHYSICAL EXAMINATION: GENERAL: She is alert, oriented x 3, in no acute distress now. Denies any chest pain at this time.  VITAL SIGNS: Blood pressure is 131/74, respirations are 15, pulse 88, temperature 97.7, saturation 92.  NECK: No JVD.  LUNGS: Clear.  HEART: Regular rate and rhythm. Normal S1, S2. No audible murmur.  ABDOMEN: Soft, nontender, positive bowel sounds.  EXTREMITIES: No pedal edema.  NEUROLOGIC: She appears intact.   DIAGNOSTIC STUDIES: Her EKG shows sinus tachycardia, 108 beats per minute. Her troponin, the peak troponin is 3.90. Second troponin was 1.60. Her MB fraction was 13.6 and 13.2. Her initial troponin was 0.06. BUN is 17, creatinine 1.29.   ASSESSMENT AND PLAN: The patient has non-STEMI, sinus tachycardia with chest pain going on for a couple of days. She is chest pain free since  she has been getting Lovenox and aspirin and advised continuation of Lovenox, and we will do cardiac catheterization tomorrow. Thank you very much for the referral.   ____________________________ Laurier NancyShaukat A. Yocheved Depner, MD sak:sg D: 05/27/2013 10:18:15 ET T: 05/27/2013 10:57:42 ET JOB#: [id number]  cc: Laurier NancyShaukat A. Jatavius Ellenwood, MD, <Dictator> Laurier NancySHAUKAT A Kieryn Burtis MD ELECTRONICALLY SIGNED 06/20/2013 13:58

## 2014-06-29 NOTE — H&P (Signed)
PATIENT NAME:  Dawn Wood, Dawn Wood MR#:  [id number] DATE OF BIRTH:  1945/07/27  DATE OF ADMISSION:  05/27/2013  PRIMARY CARE PHYSICIAN:  None.  REFERRING PHYSICIAN:  Dr. Chiquita LothJade Sung.   CHIEF COMPLAINT:  Chest pain.   HISTORY OF PRESENT ILLNESS:  [name] is a 69 year old female with a past medical history of hypertension, hyperlipidemia, diabetes mellitus, on oral medication who comes to the Emergency Department with complaints of chest pain started three days back.  The patient had a colonoscopy on Monday.  Since Wednesday started to have pain in the left parasternal area, sharp in nature. The pain has been on and off which has been gradually getting worse.  Today experienced worsening of the pain radiating to the left arm.  Concerning this, came to the Emergency Department.  Work-up in the Emergency Department with EKG and cardiac enzymes unremarkable except for troponin of 0.06.  The patient was mildly hypertensive with a blood pressure of 160/80.  The patient denies having any palpitations, lightheadedness, diaphoresis.   PAST MEDICAL HISTORY: 1.  Hypertension.  2.  Diabetes mellitus, on oral medication. 3.  Gastroesophageal reflux disease.   ALLERGIES:  No known drug allergies.   HOME MEDICATIONS:   1000 mg 2 times a day.   SOCIAL HISTORY:  No history of smoking, drinking alcohol or using illicit drugs.  Lives by herself.  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   FAMILY HISTORY:  No obvious heart problems run in the family.  Daughter had an open heart surgery x 2 at 619 and 50 secondary to congenital heart problems.   REVIEW OF SYSTEMS:  CONSTITUTIONAL:  Generalized weakness.  EYES:  No change in vision.  EARS, NOSE, THROAT:  No change in hearing.  RESPIRATORY:  No cough, shortness of breath.  CARDIOVASCULAR:  Has chest pain.  GASTROINTESTINAL:  No nausea, vomiting, abdominal pain. GENITOURINARY:  No dysuria or hematuria. HEMATOLOGY:  No easy bruising or bleeding.  ENDOCRINE:  No  polyuria or polydipsia.  Has diagnosis of diabetes mellitus.  SKIN:  No rash or lesions.  MUSCULOSKELETAL:  No joint pains and aches.  NEUROLOGIC:  No weakness or numbness in any part of the body.   PHYSICAL EXAMINATION: GENERAL:  This is a well-nourished, age-appropriate female lying down in the bed, not in distress.  VITAL SIGNS:  Temperature 98, pulse 98, blood pressure 148/85, respiratory rate of 18, oxygen saturation 98% on room air.  HEENT:  Head normocephalic, atraumatic.  Eyes, no scleral icterus.  Conjunctivae normal.  Pupils equal and react to light.  Extraocular movements are intact.  Mucous membranes moist.  No pharyngeal erythema.  NECK:  Supple.  No lymphadenopathy.  No JVD.  No carotid bruit.  CHEST:  Has focal tenderness on the left parasternal area.  LUNGS:  Bilateral clear to auscultation.  HEART:  S1, S2 regular.  No murmurs are heard.  ABDOMEN:  Bowel sounds plus.  Soft, nontender, nondistended.  EXTREMITIES:  No pedal edema.  Pulses 2+.  NEUROLOGIC:  The patient is alert, oriented to place, person and time.  Cranial nerves II through XII intact.  Motor 5 by 5 in upper and lower extremities.   LABORATORY DATA:  CBC is completely within normal limits.  CMP, glucose 199, the rest of all the values are within normal limits.  Troponin 0.06.  Chest x-ray, one view portable:  No acute cardiopulmonary disease.   EKG, 12-lead:  Normal sinus rhythm with no ST-T wave abnormalities.   ASSESSMENT AND PLAN:  [name] is a 69 year old female who comes to the Emergency Department with chest pain.  1.  Chest pain.  This is an atypical pain, musculoskeletal pain.  We will continue to cycle cardiac enzymes x 3.  If negative, the patient could be discharged home with a stress test as an outpatient.  The patient at baseline does not experience any chest pain with walking or with exertion.  We will obtain lipid profile.  Keep the patient on aspirin until we rule out the cardiac enzymes.  2.   Diabetes mellitus.  We will hold the metformin for now.  Keep the patient on sliding insulin.  3.  Hypertension.  The patient initially had hypertension systolic 160s to 161W.  This could be secondary to anxiety.  Current systolic blood pressure is in the 130s.  We will continue to watch.  The patient is not on any medications.  If the patient continues to have elevated blood pressure consider starting the patient on lisinopril prior to discharge.  4.  Keep the patient on deep vein thrombosis prophylaxis with Lovenox.   TIME SPENT:  45 minutes.    ____________________________ Susa Griffins, MD pv:ea D: 05/27/2013 00:50:24 ET T: 05/27/2013 02:55:30 ET JOB#: [id number]  cc: Susa Griffins, MD, <Dictator> Susa Griffins MD ELECTRONICALLY SIGNED 06/21/2013 0:09

## 2014-07-01 NOTE — Addendum Note (Signed)
Addended by: Sharee PimpleMCCHESNEY, MARILYN K on: 07/01/2014 03:09 PM   Modules accepted: Orders

## 2014-07-27 ENCOUNTER — Other Ambulatory Visit: Payer: Self-pay | Admitting: Interventional Cardiology

## 2014-08-06 ENCOUNTER — Telehealth: Payer: Self-pay | Admitting: Interventional Cardiology

## 2014-08-06 NOTE — Telephone Encounter (Signed)
New Message  Pt requested to speak w/ RN: would not specify. Please call back and dsicuss.

## 2014-08-06 NOTE — Telephone Encounter (Signed)
Pt called to to inform us that she has joined a gym and would like to know what type of exercise would be recommended for her. Adv her that low impact aerobics would be ok, waling on the treadmill, using a stationary bike, or silver sneakers would be great. Pt thanked me for the call back, and verbalized understanding

## 2014-08-14 ENCOUNTER — Other Ambulatory Visit: Payer: Self-pay

## 2014-08-14 DIAGNOSIS — Z1231 Encounter for screening mammogram for malignant neoplasm of breast: Secondary | ICD-10-CM

## 2014-08-21 ENCOUNTER — Ambulatory Visit: Payer: BC Managed Care – PPO

## 2014-08-23 ENCOUNTER — Ambulatory Visit
Admission: RE | Admit: 2014-08-23 | Discharge: 2014-08-23 | Disposition: A | Discharge status: Home / Self Care | Payer: BC Managed Care – PPO | Source: Ambulatory Visit

## 2014-08-23 DIAGNOSIS — Z1231 Encounter for screening mammogram for malignant neoplasm of breast: Secondary | ICD-10-CM

## 2014-09-24 ENCOUNTER — Other Ambulatory Visit (HOSPITAL_COMMUNITY)
Admission: RE | Admit: 2014-09-24 | Discharge: 2014-09-24 | Disposition: A | Discharge status: Home / Self Care | Payer: BC Managed Care – PPO | Source: Ambulatory Visit | Attending: Family Medicine | Admitting: Family Medicine

## 2014-09-24 ENCOUNTER — Other Ambulatory Visit: Payer: Self-pay | Admitting: Family Medicine

## 2014-09-24 DIAGNOSIS — N898 Other specified noninflammatory disorders of vagina: Secondary | ICD-10-CM | POA: Diagnosis present

## 2014-09-26 LAB — CYTOLOGY - PAP

## 2014-11-21 ENCOUNTER — Ambulatory Visit: Payer: BC Managed Care – PPO | Admitting: Interventional Cardiology

## 2014-11-22 ENCOUNTER — Ambulatory Visit (INDEPENDENT_AMBULATORY_CARE_PROVIDER_SITE_OTHER): Payer: BC Managed Care – PPO | Admitting: Interventional Cardiology

## 2014-11-22 ENCOUNTER — Encounter: Payer: Self-pay | Admitting: Interventional Cardiology

## 2014-11-22 VITALS — BP 160/88 | HR 72 | Ht 65.5 in | Wt 193.8 lb

## 2014-11-22 DIAGNOSIS — I739 Peripheral vascular disease, unspecified: Secondary | ICD-10-CM

## 2014-11-22 DIAGNOSIS — IMO0002 Reserved for concepts with insufficient information to code with codable children: Secondary | ICD-10-CM

## 2014-11-22 DIAGNOSIS — E1165 Type 2 diabetes mellitus with hyperglycemia: Secondary | ICD-10-CM

## 2014-11-22 DIAGNOSIS — I10 Essential (primary) hypertension: Secondary | ICD-10-CM | POA: Diagnosis not present

## 2014-11-22 DIAGNOSIS — I2510 Atherosclerotic heart disease of native coronary artery without angina pectoris: Secondary | ICD-10-CM | POA: Diagnosis not present

## 2014-11-22 DIAGNOSIS — E785 Hyperlipidemia, unspecified: Secondary | ICD-10-CM | POA: Diagnosis not present

## 2014-11-22 MED ORDER — SPIRONOLACTONE-HCTZ 25-25 MG PO TABS: 1.0000 | ORAL_TABLET | Freq: Every day | ORAL | Status: DC

## 2014-11-22 NOTE — Patient Instructions (Signed)
Medication Instructions:  1)STOP Spironolactone 2)START Aldactazide 25-25 mg daily. An Rx has been sent to your pharmacy  Labwork: Your physician recommends that you return for a FASTING lipid profile and cmet on 09/02/14  Testing/Procedures: None ordered  Follow-Up: Your physician wants you to follow-up in: 6 months with Dr.Smith You will receive a reminder letter in the mail two months in advance. If you don't receive a letter, please call our office to schedule the follow-up appointment.   Any Other Special Instructions Will Be Listed Below (If Applicable).

## 2014-11-22 NOTE — Progress Notes (Signed)
Cardiology Office Note   Date:  11/22/2014   ID:  Dawn Wood, DOB 10-Aug-1945, MRN [id number]  PCP:  Antony Blackbird, MD  Cardiologist:  Sinclair Grooms, MD   Chief Complaint  Patient presents with  . Coronary Artery Disease  . Claudication      History of Present Illness: Dawn Wood is a 69 y.o. female who presents for  Diabetes mellitus type II,  Coronary artery disease with prior stent, PAD with limiting location left leg, essential hypertension, and hyperlipidemia.   Dawn Wood is gaining weight. She is now on insulin. She has dyspnea on exertion. She has not had angina. She has not needed nitroglycerin. Her blood pressures have not been well-controlled since weight gain. She denies blood in her urine in stool. There is  Bilateral lower extremity ankle edema that is likely related to amlodipine.    Past Medical History  Diagnosis Date  . Diabetes mellitus without complication   . Hypertension   . Coronary artery disease   . MI (myocardial infarction)   . Peripheral vascular disease   . GERD (gastroesophageal reflux disease)     Past Surgical History  Procedure Laterality Date  . Coronary stent placement    . Abdominal hysterectomy       Current Outpatient Prescriptions  Medication Sig Dispense Refill  . amLODipine (NORVASC) 5 MG tablet TAKE 1 TABLET BY MOUTH EVERY DAY 180 tablet 1  . aspirin 81 MG tablet Take 81 mg by mouth daily.    Marland Kitchen atorvastatin (LIPITOR) 20 MG tablet Take 1 tablet (20 mg total) by mouth 3 (three) times a week. Mon,Wed,Fri    . Cholecalciferol (VITAMIN D) 2000 UNITS CAPS Take 1 capsule by mouth daily.    . clopidogrel (PLAVIX) 75 MG tablet Take 1 tablet (75 mg total) by mouth daily. 90 tablet 1  . fluticasone (FLONASE) 50 MCG/ACT nasal spray Place 1 spray into both nostrils daily as needed for allergies or rhinitis.    Marland Kitchen insulin aspart protamine- aspart (NOVOLOG MIX 70/30) (70-30) 100 UNIT/ML injection Inject 15 Units into the skin 2  (two) times daily with a meal.    . metFORMIN (GLUMETZA) 500 MG (MOD) 24 hr tablet Take 500 mg by mouth 2 (two) times daily with a meal. 1 tablets in the morning and 2 tablets in the evening    . metoprolol succinate (TOPROL-XL) 100 MG 24 hr tablet Take 1 tablet (100 mg total) by mouth daily. Take with or immediately following a meal.    . nitroGLYCERIN (NITROSTAT) 0.4 MG SL tablet Place 0.4 mg under the tongue every 5 (five) minutes as needed for chest pain.    Marland Kitchen omeprazole (PRILOSEC) 20 MG capsule Take 20 mg by mouth daily.    . valsartan (DIOVAN) 320 MG tablet Take 1 tablet (320 mg total) by mouth daily. 30 tablet 11  . spironolactone-hydrochlorothiazide (ALDACTAZIDE) 25-25 MG per tablet Take 1 tablet by mouth daily. 30 tablet 11   No current facility-administered medications for this visit.    Allergies:   Dust mite extract; Other; and Pollen extract    Social History:  The patient  reports that she quit smoking about 3 years ago. She has never used smokeless tobacco. She reports that she drinks alcohol. She reports that she does not use illicit drugs.   Family History:  The patient's family history includes Aortic stenosis in her daughter; Cancer in her father and sister; Heart disease in her daughter; Hypertension in her  father and mother.    ROS:  Please see the history of present illness.   Otherwise, review of systems are positive for  Weight gain, poor diabetes control, left leg pain with walking..   All other systems are reviewed and negative.    PHYSICAL EXAM: VS:  BP 160/88 mmHg  Pulse 72  Ht 5' 5.5" (1.664 m)  Wt 87.907 kg (193 lb 12.8 oz)  BMI 31.75 kg/m2  SpO2 98% , BMI Body mass index is 31.75 kg/(m^2). GEN: Well nourished, well developed, in no acute distress HEENT: normal Neck: no JVD, carotid bruits, or masses Cardiac: RRR.  There  his no murmur, rub, or gallop. There is  no edema. Respiratory:  clear to auscultation bilaterally, normal work of breathing. GI:  soft, nontender, nondistended, + BS MS: no deformity or atrophy Skin: warm and dry, no rash Neuro:  Strength and sensation are intact Psych: euthymic mood, full affect   EKG:  EKG  Is not ordered today.    Recent Labs: 01/10/2014: ALT 17 06/19/2014: BUN 17; Creatinine, Ser 0.88; Potassium 3.6; Sodium 137    Lipid Panel    Component Value Date/Time   CHOL 129 01/10/2014 0935   CHOL 167 05/28/2013 0413   TRIG 102.0 01/10/2014 0935   TRIG 180 05/28/2013 0413   HDL 33.90* 01/10/2014 0935   HDL 37* 05/28/2013 0413   CHOLHDL 4 01/10/2014 0935   VLDL 20.4 01/10/2014 0935   VLDL 36 05/28/2013 0413   LDLCALC 75 01/10/2014 0935   LDLCALC 94 05/28/2013 0413      Wt Readings from Last 3 Encounters:  11/22/14 87.907 kg (193 lb 12.8 oz)  06/27/14 84.823 kg (187 lb)  06/19/14 84.278 kg (185 lb 12.8 oz)      Other studies Reviewed: Additional studies/ records that were reviewed today include:  none. The findings include  none.    ASSESSMENT AND PLAN:  1. CAD in native artery Asymptomatic  2. Dyslipidemia  followed by primary care and diabetologist, Dr. Suzette Battiest  3. Diabetes mellitus type II, uncontrolled  poor control  4. Essential hypertension Poor control. Change to Aldactazide 25/25 one tablet daily.  5. PVD (peripheral vascular disease) Left calf claudication    Current medicines are reviewed at length with the patient today.  The patient has the following concerns regarding medicines:  Walls to discontinue amlodipine because of anklet edema..  The following changes/actions have been instituted:    Comprehensive metabolic panel in one week to monitor the impact of the new diuretic regimen.  Lipid panel in one week  Blood pressures checked  2-3 times per week.  Labs/ tests ordered today include:   Orders Placed This Encounter  Procedures  . Comp Met (CMET)     Disposition:   FU with HS in 6 months  Signed, Sinclair Grooms, MD  11/22/2014 5:34 PM      [address], [place], [place]  [address] Phone: [phone]; Fax: [phone]

## 2014-12-02 ENCOUNTER — Other Ambulatory Visit (INDEPENDENT_AMBULATORY_CARE_PROVIDER_SITE_OTHER): Payer: BC Managed Care – PPO | Admitting: *Deleted

## 2014-12-02 DIAGNOSIS — E785 Hyperlipidemia, unspecified: Secondary | ICD-10-CM | POA: Diagnosis not present

## 2014-12-02 DIAGNOSIS — I10 Essential (primary) hypertension: Secondary | ICD-10-CM | POA: Diagnosis not present

## 2014-12-02 LAB — COMPREHENSIVE METABOLIC PANEL
ALT: 13 U/L (ref 0–35)
AST: 12 U/L (ref 0–37)
Albumin: 4.1 g/dL (ref 3.5–5.2)
Alkaline Phosphatase: 87 U/L (ref 39–117)
BUN: 20 mg/dL (ref 6–23)
CO2: 28 mEq/L (ref 19–32)
Calcium: 9.5 mg/dL (ref 8.4–10.5)
Chloride: 101 mEq/L (ref 96–112)
Creatinine, Ser: 0.85 mg/dL (ref 0.40–1.20)
GFR: 85 mL/min (ref >60.00)
Glucose, Bld: 156 mg/dL — ABNORMAL HIGH (ref 70–99)
Potassium: 4 mEq/L (ref 3.5–5.1)
Sodium: 138 mEq/L (ref 135–145)
Total Bilirubin: 0.6 mg/dL (ref 0.2–1.2)
Total Protein: 7.4 g/dL (ref 6.0–8.3)

## 2014-12-02 LAB — LIPID PANEL
Cholesterol: 132 mg/dL (ref 0–200)
HDL: 38.9 mg/dL — ABNORMAL LOW (ref >39.00)
LDL Cholesterol: 71 mg/dL (ref 0–99)
NonHDL: 93.24
Total CHOL/HDL Ratio: 3
Triglycerides: 110 mg/dL (ref 0.0–149.0)
VLDL: 22 mg/dL (ref 0.0–40.0)

## 2014-12-03 ENCOUNTER — Telehealth: Payer: Self-pay

## 2014-12-03 NOTE — Telephone Encounter (Signed)
Pt was instructed to the office after her o/v with Dr.Smith on 9/16 to report bp readings. Pt not able to report any consistent bp readings. Adv pt to ck bp daily the same time daily, 3-4 hours after taking bp med (around mid-day). Adv pt to record date time,bp, and heartrate. Pt will call the office the end of the week with readings. Pt agreeable and verbalized understanding.

## 2014-12-03 NOTE — Telephone Encounter (Signed)
-----   Message from Lyn Records, MD sent at 12/02/2014  3:55 PM EDT ----- Labs are at target. Liver tests are normal. Kidney function is normal.

## 2014-12-03 NOTE — Telephone Encounter (Signed)
Pt aware.

## 2014-12-19 ENCOUNTER — Other Ambulatory Visit: Payer: Self-pay | Admitting: Interventional Cardiology

## 2015-01-09 ENCOUNTER — Ambulatory Visit: Payer: BC Managed Care – PPO | Admitting: Vascular Surgery

## 2015-01-09 ENCOUNTER — Ambulatory Visit: Payer: BC Managed Care – PPO | Admitting: Family

## 2015-01-09 ENCOUNTER — Encounter (HOSPITAL_COMMUNITY): Payer: BC Managed Care – PPO

## 2015-01-13 ENCOUNTER — Other Ambulatory Visit: Payer: BC Managed Care – PPO

## 2015-03-17 ENCOUNTER — Telehealth: Payer: Self-pay

## 2015-03-17 NOTE — Telephone Encounter (Signed)
Prior auth for Spironolactone-HCTZ sent to [facility].

## 2015-03-17 NOTE — Telephone Encounter (Signed)
Opened in error

## 2015-03-31 ENCOUNTER — Encounter: Payer: Self-pay | Admitting: Family

## 2015-04-02 ENCOUNTER — Encounter (HOSPITAL_COMMUNITY): Payer: BC Managed Care – PPO

## 2015-04-02 ENCOUNTER — Ambulatory Visit: Payer: BC Managed Care – PPO | Admitting: Family

## 2015-04-09 ENCOUNTER — Ambulatory Visit (HOSPITAL_COMMUNITY)
Admission: RE | Admit: 2015-04-09 | Discharge: 2015-04-09 | Disposition: A | Discharge status: Home / Self Care | Payer: BC Managed Care – PPO | Source: Ambulatory Visit | Attending: Family | Admitting: Family

## 2015-04-09 ENCOUNTER — Encounter: Payer: Self-pay | Admitting: Family

## 2015-04-09 ENCOUNTER — Other Ambulatory Visit: Payer: Self-pay | Admitting: Vascular Surgery

## 2015-04-09 ENCOUNTER — Ambulatory Visit (INDEPENDENT_AMBULATORY_CARE_PROVIDER_SITE_OTHER): Payer: BC Managed Care – PPO | Admitting: Family

## 2015-04-09 VITALS — BP 130/74 | HR 61 | Ht 65.5 in | Wt 194.0 lb

## 2015-04-09 DIAGNOSIS — E1151 Type 2 diabetes mellitus with diabetic peripheral angiopathy without gangrene: Secondary | ICD-10-CM

## 2015-04-09 DIAGNOSIS — I739 Peripheral vascular disease, unspecified: Secondary | ICD-10-CM

## 2015-04-09 DIAGNOSIS — Z87891 Personal history of nicotine dependence: Secondary | ICD-10-CM

## 2015-04-09 DIAGNOSIS — I70212 Atherosclerosis of native arteries of extremities with intermittent claudication, left leg: Secondary | ICD-10-CM | POA: Diagnosis not present

## 2015-04-09 DIAGNOSIS — R938 Abnormal findings on diagnostic imaging of other specified body structures: Secondary | ICD-10-CM | POA: Insufficient documentation

## 2015-04-09 DIAGNOSIS — I10 Essential (primary) hypertension: Secondary | ICD-10-CM | POA: Insufficient documentation

## 2015-04-09 DIAGNOSIS — E119 Type 2 diabetes mellitus without complications: Secondary | ICD-10-CM | POA: Insufficient documentation

## 2015-04-09 NOTE — Progress Notes (Signed)
[facility] [place] HISTORY AND PHYSICAL -PAD  History of Present Illness Dawn Wood is a 70 y.o. female patient of Dr. Darrick Penna who returns for evaluation of moderate PAD in her left leg. Dr. Darrick Penna last evaluated pt in April of 2016. At that time her left leg was essentially asymptomatic. The patient did not feel disabled by this. She was happy with her walking distance. She prefered conservative measures with risk factor modification and a walking program at that point. Dr. Darrick Penna reassured her that her lifetime risk of limb loss if she continues to refrain from smoking and her diabetes is controlled is less than 5%. He also discussed with her the possibility of an intervention to improve her walking distance. She prefered conservative management at that point. Dr. Darrick Penna advised pt to return in 6 months for evaluation.  She occasionally has some pain in her right groin and inner thigh which she thinks is related to her previous cardiac catheterization in 2015. She denies any pain in her left foot.  Other medical problems include coronary artery disease with previous stenting, diabetes, hypertension. Her hypertension and coronary disease have been stable. However she states her glucose control is improved. She is a former smoker quit in 2012. She is on Plavix.  She injured her right arm in 2015, is receiving therapy for this.  She has left calf claudication after walking 30 minutes, relieved with rest, denies claudication in right leg. She denies non healing wounds.   Pt Diabetic: Yes, states her last A1C was 7.? Pt smoker: former smoker, quit in 2012, had smoked for 45 years  Pt meds include: Statin :Yes, frequency of dosing was decreased due to myalgias Betablocker: Yes ASA: Yes Other anticoagulants/antiplatelets: Plavix   Past Medical History  Diagnosis Date  . Diabetes mellitus without complication   . Hypertension   . Coronary artery disease   . MI  (myocardial infarction)   . Peripheral vascular disease   . GERD (gastroesophageal reflux disease)     Social History Social History  Substance Use Topics  . Smoking status: Former Smoker    Quit date: 11/07/2011  . Smokeless tobacco: Never Used  . Alcohol Use: 0.0 oz/week    0 Standard drinks or equivalent per week     Comment: occ    Family History Family History  Problem Relation Age of Onset  . Aortic stenosis Daughter   . Heart disease Daughter     before age 40  . Hypertension Mother   . Cancer Father   . Hypertension Father   . Cancer Sister     Past Surgical History  Procedure Laterality Date  . Coronary stent placement    . Abdominal hysterectomy      Allergies  Allergen Reactions  . Dust Mite Extract Other (See Comments)    REACTION: SNEEZING  . Other Other (See Comments)    ALLERGEN: DEODORIZERS SPRAY AND PERFUMES REACTION: SNEEZING  . Pollen Extract Other (See Comments)    Reaction unknown    Current Outpatient Prescriptions  Medication Sig Dispense Refill  . amLODipine (NORVASC) 5 MG tablet TAKE 1 TABLET BY MOUTH EVERY DAY 180 tablet 1  . aspirin 81 MG tablet Take 81 mg by mouth daily.    Marland Kitchen atorvastatin (LIPITOR) 20 MG tablet TAKE 1 TABLET BY MOUTH DAILY 90 tablet 1  . Cholecalciferol (VITAMIN D) 2000 UNITS CAPS Take 1 capsule by mouth daily.    . clopidogrel (PLAVIX) 75 MG tablet TAKE  1 TABLET BY MOUTH EVERY DAY 90 tablet 1  . fluticasone (FLONASE) 50 MCG/ACT nasal spray Place 1 spray into both nostrils daily as needed for allergies or rhinitis.    Marland Kitchen insulin aspart protamine- aspart (NOVOLOG MIX 70/30) (70-30) 100 UNIT/ML injection Inject 15 Units into the skin 2 (two) times daily with a meal.    . metFORMIN (GLUMETZA) 500 MG (MOD) 24 hr tablet Take 500 mg by mouth 2 (two) times daily with a meal. 1 tablets in the morning and 2 tablets in the evening    . metoprolol succinate (TOPROL-XL) 100 MG 24 hr tablet Take 1 tablet (100 mg total) by mouth  daily. Take with or immediately following a meal.    . nitroGLYCERIN (NITROSTAT) 0.4 MG SL tablet Place 0.4 mg under the tongue every 5 (five) minutes as needed for chest pain.    Marland Kitchen omeprazole (PRILOSEC) 20 MG capsule Take 20 mg by mouth daily.    Marland Kitchen spironolactone-hydrochlorothiazide (ALDACTAZIDE) 25-25 MG per tablet Take 1 tablet by mouth daily. 30 tablet 11  . valsartan (DIOVAN) 320 MG tablet Take 1 tablet (320 mg total) by mouth daily. 30 tablet 11   No current facility-administered medications for this visit.    ROS: See HPI for pertinent positives and negatives.   Physical Examination  Filed Vitals:   04/09/15 1543  BP: 130/74  Pulse: 61  Height: 5' 5.5" (1.664 m)  Weight: 194 lb (87.998 kg)  SpO2: 100%   Body mass index is 31.78 kg/(m^2).  General: A&O x 3, WDWN. Gait: normal Eyes: PERRLA. Pulmonary: CTAB, without wheezes , rales or rhonchi. Cardiac: regular rhythm, no detected murmur.         Carotid Bruits Right Left   Negative Negative  Aorta is not palpable. Radial pulses: 2+ palpable and =                           VASCULAR EXAM: Extremities no ischemic changes, no Gangrene; no open wounds.                                                                                                          LE Pulses Right Left       FEMORAL  1+ palpable  1+ palpable        POPLITEAL  not palpable   not palpable       POSTERIOR TIBIAL  2+ palpable   not palpable        DORSALIS PEDIS      ANTERIOR TIBIAL 1+ palpable  not palpable    Abdomen: soft, NT, no palpable masses. Skin: no rashes, See Extremities. Musculoskeletal: no muscle wasting or atrophy.  Neurologic: A&O X 3; Appropriate Affect, MOTOR FUNCTION:  moving all extremities equally, motor strength 5/5 throughout. Speech is fluent/normal.  CN 2-12 intact.    Non-Invasive Vascular Imaging: DATE: 04/09/2015 ABI (Date: 04/09/2015)  R: 1.1 (1.0, 06/27/14), DP: biphasic, PT: biphasic, TBI: 0.83  L: 0.66  (0.61), DP: monophasic, PT: monophasic, TBI: 0.53  ASSESSMENT: Dawn Wood is a 70 y.o. female who has mild claudication in her left calf, no claudication symptoms in her right leg, no signs of ischemia in her feet legs. She quit smoking in 2012, had smoked for 45 years. She has DM which is almost in control with a self reported A1C of 7.?. ABI's are stable from 9 months ago: normal right leg with biphasic waveforms, moderate arterial occlusive disease in her symptomatic left leg with monophasic waveforms.   PLAN:  Graduated walking program discussed with patient in detail.  Based on the patient's vascular studies and examination, pt will return to clinic in 1 year with ABI's. I advised her to notify us if she develops concerns re the circulation in her legs.   I discussed in depth with the patient the nature of atherosclerosis, and emphasized the importance of maximal medical management including strict control of blood pressure, blood glucose, and lipid levels, obtaining regular exercise, and continued cessation of smoking.  The patient is aware that without maximal medical management the underlying atherosclerotic disease process will progress, limiting the benefit of any interventions.  The patient was given information about PAD including signs, symptoms, treatment, what symptoms should prompt the patient to seek immediate medical care, and risk reduction measures to take.  Charisse March, RN, MSN, FNP-C [facility] Phone: [phone]  Clinic MD: Edilia Bo  04/09/2015 3:38 PM

## 2015-04-09 NOTE — Patient Instructions (Signed)
Peripheral Vascular Disease Peripheral vascular disease (PVD) is a disease of the blood vessels that are not part of your heart and brain. A simple term for PVD is poor circulation. In most cases, PVD narrows the blood vessels that carry blood from your heart to the rest of your body. This can result in a decreased supply of blood to your arms, legs, and internal organs, like your stomach or kidneys. However, it most often affects a person's lower legs and feet. There are two types of PVD.  Organic PVD. This is the more common type. It is caused by damage to the structure of blood vessels.  Functional PVD. This is caused by conditions that make blood vessels contract and tighten (spasm). Without treatment, PVD tends to get worse over time. PVD can also lead to acute ischemic limb. This is when an arm or limb suddenly has trouble getting enough blood. This is a medical emergency. CAUSES Each type of PVD has many different causes. The most common cause of PVD is buildup of a fatty material (plaque) inside of your arteries (atherosclerosis). Small amounts of plaque can break off from the walls of the blood vessels and become lodged in a smaller artery. This blocks blood flow and can cause acute ischemic limb. Other common causes of PVD include:  Blood clots that form inside of blood vessels.  Injuries to blood vessels.  Diseases that cause inflammation of blood vessels or cause blood vessel spasms.  Health behaviors and health history that increase your risk of developing PVD. RISK FACTORS  You may have a greater risk of PVD if you:  Have a family history of PVD.  Have certain medical conditions, including:  High cholesterol.  Diabetes.  High blood pressure (hypertension).  Coronary heart disease.  Past problems with blood clots.  Past injury, such as burns or a broken bone. These may have damaged blood vessels in your limbs.  Buerger disease. This is caused by inflamed blood  vessels in your hands and feet.  Some forms of arthritis.  Rare birth defects that affect the arteries in your legs.  Use tobacco.  Do not get enough exercise.  Are obese.  Are age 50 or older. SIGNS AND SYMPTOMS  PVD may cause many different symptoms. Your symptoms depend on what part of your body is not getting enough blood. Some common signs and symptoms include:  Cramps in your lower legs. This may be a symptom of poor leg circulation (claudication).  Pain and weakness in your legs while you are physically active that goes away when you rest (intermittent claudication).  Leg pain when at rest.  Leg numbness, tingling, or weakness.  Coldness in a leg or foot, especially when compared with the other leg.  Skin or hair changes. These can include:  Hair loss.  Shiny skin.  Pale or bluish skin.  Thick toenails.  Inability to get or maintain an erection (erectile dysfunction). People with PVD are more prone to developing ulcers and sores on their toes, feet, or legs. These may take longer than normal to heal. DIAGNOSIS Your health care provider may diagnose PVD from your signs and symptoms. The health care provider will also do a physical exam. You may have tests to find out what is causing your PVD and determine its severity. Tests may include:  Blood pressure recordings from your arms and legs and measurements of the strength of your pulses (pulse volume recordings).  Imaging studies using sound waves to take pictures of   the blood flow through your blood vessels (Doppler ultrasound).  Injecting a dye into your blood vessels before having imaging studies using:  X-rays (angiogram or arteriogram).  Computer-generated X-rays (CT angiogram).  A powerful electromagnetic field and a computer (magnetic resonance angiogram or MRA). TREATMENT Treatment for PVD depends on the cause of your condition and the severity of your symptoms. It also depends on your age. Underlying  causes need to be treated and controlled. These include long-lasting (chronic) conditions, such as diabetes, high cholesterol, and high blood pressure. You may need to first try making lifestyle changes and taking medicines. Surgery may be needed if these do not work. Lifestyle changes may include:  Quitting smoking.  Exercising regularly.  Following a low-fat, low-cholesterol diet. Medicines may include:  Blood thinners to prevent blood clots.  Medicines to improve blood flow.  Medicines to improve your blood cholesterol levels. Surgical procedures may include:  A procedure that uses an inflated balloon to open a blocked artery and improve blood flow (angioplasty).  A procedure to put in a tube (stent) to keep a blocked artery open (stent implant).  Surgery to reroute blood flow around a blocked artery (peripheral bypass surgery).  Surgery to remove dead tissue from an infected wound on the affected limb.  Amputation. This is surgical removal of the affected limb. This may be necessary in cases of acute ischemic limb that are not improved through medical or surgical treatments. HOME CARE INSTRUCTIONS  Take medicines only as directed by your health care provider.  Do not use any tobacco products, including cigarettes, chewing tobacco, or electronic cigarettes. If you need help quitting, ask your health care provider.  Lose weight if you are overweight, and maintain a healthy weight as directed by your health care provider.  Eat a diet that is low in fat and cholesterol. If you need help, ask your health care provider.  Exercise regularly. Ask your health care provider to suggest some good activities for you.  Use compression stockings or other mechanical devices as directed by your health care provider.  Take good care of your feet.  Wear comfortable shoes that fit well.  Check your feet often for any cuts or sores. SEEK MEDICAL CARE IF:  You have cramps in your legs  while walking.  You have leg pain when you are at rest.  You have coldness in a leg or foot.  Your skin changes.  You have erectile dysfunction.  You have cuts or sores on your feet that are not healing. SEEK IMMEDIATE MEDICAL CARE IF:  Your arm or leg turns cold and blue.  Your arms or legs become red, warm, swollen, painful, or numb.  You have chest pain or trouble breathing.  You suddenly have weakness in your face, arm, or leg.  You become very confused or lose the ability to speak.  You suddenly have a very bad headache or lose your vision.   This information is not intended to replace advice given to you by your health care provider. Make sure you discuss any questions you have with your health care provider.   Document Released: 04/01/2004 Document Revised: 03/15/2014 Document Reviewed: 08/02/2013 Elsevier Interactive Patient Education 2016 [employer]  

## 2015-04-30 ENCOUNTER — Telehealth: Payer: Self-pay | Admitting: Interventional Cardiology

## 2015-04-30 NOTE — Telephone Encounter (Signed)
New message ° ° ° ° ° °Talk to the nurse.  She would not tell me what she wanted °

## 2015-04-30 NOTE — Telephone Encounter (Signed)
Returned pt call. Pt sts that she will be seeing her orthopedic physician this afternoon. Pt tore her rotator cuff in 2015. It is still not repaired. She has been having a lot of pain in that shoulder lately. Her orthopedic wants her to have a cortisone injection. Pt is on Plavix and Asa. Adv pt to make sure that her orthopedic knows all of the medications she is on, she should have her ortho contact our office if clearance is needed. Pt verbalized understanding.

## 2015-05-23 ENCOUNTER — Other Ambulatory Visit: Payer: Self-pay | Admitting: Interventional Cardiology

## 2015-06-09 ENCOUNTER — Ambulatory Visit (INDEPENDENT_AMBULATORY_CARE_PROVIDER_SITE_OTHER): Payer: BC Managed Care – PPO | Admitting: Interventional Cardiology

## 2015-06-09 ENCOUNTER — Encounter: Payer: Self-pay | Admitting: Interventional Cardiology

## 2015-06-09 VITALS — BP 152/84 | HR 79 | Ht 65.5 in | Wt 201.2 lb

## 2015-06-09 DIAGNOSIS — I2510 Atherosclerotic heart disease of native coronary artery without angina pectoris: Secondary | ICD-10-CM

## 2015-06-09 DIAGNOSIS — E785 Hyperlipidemia, unspecified: Secondary | ICD-10-CM

## 2015-06-09 DIAGNOSIS — I10 Essential (primary) hypertension: Secondary | ICD-10-CM | POA: Diagnosis not present

## 2015-06-09 DIAGNOSIS — I739 Peripheral vascular disease, unspecified: Secondary | ICD-10-CM

## 2015-06-09 NOTE — Patient Instructions (Signed)
Medication Instructions:  Your physician recommends that you continue on your current medications as directed. Please refer to the Current Medication list given to you today.   Labwork: None ordered  Testing/Procedures: None ordered  Follow-Up: Your physician wants you to follow-up in: 6 months You will receive a reminder letter in the mail two months in advance. If you don't receive a letter, please call our office to schedule the follow-up appointment.   Any Other Special Instructions Will Be Listed Below (If Applicable). Your physician discussed the importance of regular exercise and recommended that you start or continue a regular exercise program for good health.       If you need a refill on your cardiac medications before your next appointment, please call your pharmacy.  . 

## 2015-06-09 NOTE — Progress Notes (Signed)
Cardiology Office Note   Date:  06/09/2015   ID:  Dawn Wood, DOB 02/24/1946, MRN [id number]  PCP:  Cain SaupeFULP, CAMMIE, MD  Cardiologist:  Lesleigh NoeSMITH III,Mamye Bolds W, MD   Chief Complaint  Patient presents with  . Coronary Artery Disease      History of Present Illness: Dawn Wood is a 70 y.o. female who presents for CAD, diabetes mellitus, prior LAD stent, hypertension, and hyperlipidemia.  No angina. Has been relatively sedentary due to right shoulder rehabilitation for torn rotator cuff. Did not have surgery but rather rehabilitation. She has not been exercising. She complains of abdominal girth increase. Diabetes therapy has been intensified.  Past Medical History  Diagnosis Date  . Diabetes mellitus without complication (HCC)   . Hypertension   . Coronary artery disease   . MI (myocardial infarction) (HCC)   . Peripheral vascular disease (HCC)   . GERD (gastroesophageal reflux disease)     Past Surgical History  Procedure Laterality Date  . Coronary stent placement    . Abdominal hysterectomy       Current Outpatient Prescriptions  Medication Sig Dispense Refill  . amLODipine (NORVASC) 5 MG tablet TAKE 1 TABLET BY MOUTH EVERY DAY 180 tablet 1  . aspirin 81 MG tablet Take 81 mg by mouth daily.    Marland Kitchen. atorvastatin (LIPITOR) 20 MG tablet TAKE 1 TABLET BY MOUTH DAILY 90 tablet 1  . Cholecalciferol (VITAMIN D) 2000 UNITS CAPS Take 1 capsule by mouth daily.    . clopidogrel (PLAVIX) 75 MG tablet TAKE 1 TABLET BY MOUTH EVERY DAY 90 tablet 1  . fluticasone (FLONASE) 50 MCG/ACT nasal spray Place 1 spray into both nostrils daily as needed for allergies or rhinitis.    Marland Kitchen. insulin aspart protamine- aspart (NOVOLOG MIX 70/30) (70-30) 100 UNIT/ML injection Inject 30 Units into the skin 2 (two) times daily with a meal.     . metFORMIN (GLUMETZA) 500 MG (MOD) 24 hr tablet Take 500 mg by mouth 2 (two) times daily with a meal. 1 tablets in the morning and 2 tablets in the evening    .  metoprolol succinate (TOPROL-XL) 100 MG 24 hr tablet Take 1 tablet (100 mg total) by mouth daily. Take with or immediately following a meal.    . nitroGLYCERIN (NITROSTAT) 0.4 MG SL tablet Place 0.4 mg under the tongue every 5 (five) minutes as needed for chest pain.    Marland Kitchen. omeprazole (PRILOSEC) 20 MG capsule Take 20 mg by mouth daily.    Marland Kitchen. spironolactone-hydrochlorothiazide (ALDACTAZIDE) 25-25 MG per tablet Take 1 tablet by mouth daily. 30 tablet 11  . valsartan (DIOVAN) 320 MG tablet TAKE 1 TABLET BY MOUTH DAILY 30 tablet 5   No current facility-administered medications for this visit.    Allergies:   Dust mite extract; Other; and Pollen extract    Social History:  The patient  reports that she quit smoking about 3 years ago. She has never used smokeless tobacco. She reports that she drinks alcohol. She reports that she does not use illicit drugs.   Family History:  The patient's family history includes Aortic stenosis in her daughter; Cancer in her father and sister; Heart disease in her daughter; Hypertension in her father and mother.    ROS:  Please see the history of present illness.   Otherwise, review of systems are positive for Unexplained weight gain, abdominal bloating, muscle pain, and rare fleeting pinpoint chest discomfort.   All other systems are reviewed and  negative.    PHYSICAL EXAM: VS:  BP 152/84 mmHg  Pulse 79  Ht 5' 5.5" (1.664 m)  Wt 201 lb 3.2 oz (91.264 kg)  BMI 32.96 kg/m2 , BMI Body mass index is 32.96 kg/(m^2). GEN: Well nourished, well developed, in no acute distress HEENT: normal Neck: no JVD, carotid bruits, or masses Cardiac: RRR.  There is no murmur, rub, or gallop. There is no edema. Respiratory:  clear to auscultation bilaterally, normal work of breathing. GI: soft, nontender, nondistended, + BS MS: no deformity or atrophy Skin: warm and dry, no rash Neuro:  Strength and sensation are intact Psych: euthymic mood, full affect   EKG:  EKG is  ordered today. The ekg reveals normal sinus rhythm, left axis deviation    Recent Labs: 12/02/2014: ALT 13; BUN 20; Creatinine, Ser 0.85; Potassium 4.0; Sodium 138    Lipid Panel    Component Value Date/Time   CHOL 132 12/02/2014 0856   CHOL 167 05/28/2013 0413   TRIG 110.0 12/02/2014 0856   TRIG 180 05/28/2013 0413   HDL 38.90* 12/02/2014 0856   HDL 37* 05/28/2013 0413   CHOLHDL 3 12/02/2014 0856   VLDL 22.0 12/02/2014 0856   VLDL 36 05/28/2013 0413   LDLCALC 71 12/02/2014 0856   LDLCALC 94 05/28/2013 0413      Wt Readings from Last 3 Encounters:  06/09/15 201 lb 3.2 oz (91.264 kg)  04/09/15 194 lb (87.998 kg)  11/22/14 193 lb 12.8 oz (87.907 kg)      Other studies Reviewed: Additional studies/ records that were reviewed today include: NoneThe findings include none.    ASSESSMENT AND PLAN:  1. CAD in native artery Stable based upon clinical status. Not as active as previous.  2. Essential hypertension Adequate control is not currently achieved. Repeat blood pressure 145/70.  3. PAD (peripheral artery disease) (HCC) Asymptomatic  4. Hyperlipidemia Followed by primary care    Current medicines are reviewed at length with the patient today.  The patient has the following concerns regarding medicines: None .  The following changes/actions have been instituted:    Increased aerobic activity  Call of chest discomfort  Earlier follow-up to ensure that sedentary lifestyle is continuing   Labs/ tests ordered today include:  No orders of the defined types were placed in this encounter.     Disposition:   FU with HS in 6 months  Signed, Lesleigh Noe, MD  06/09/2015 4:36 PM    [facility] [address] [place], [place], [place]  [address] Phone: [phone]; Fax: [phone]

## 2015-06-15 ENCOUNTER — Other Ambulatory Visit: Payer: Self-pay | Admitting: Interventional Cardiology

## 2015-07-03 ENCOUNTER — Other Ambulatory Visit: Payer: Self-pay | Admitting: Gastroenterology

## 2015-07-03 DIAGNOSIS — R110 Nausea: Secondary | ICD-10-CM

## 2015-07-03 DIAGNOSIS — R1011 Right upper quadrant pain: Secondary | ICD-10-CM

## 2015-07-03 DIAGNOSIS — R1013 Epigastric pain: Secondary | ICD-10-CM

## 2015-07-04 ENCOUNTER — Encounter: Payer: Self-pay | Admitting: Interventional Cardiology

## 2015-07-10 ENCOUNTER — Other Ambulatory Visit: Payer: BC Managed Care – PPO

## 2015-07-14 ENCOUNTER — Ambulatory Visit
Admission: RE | Admit: 2015-07-14 | Discharge: 2015-07-14 | Disposition: A | Discharge status: Home / Self Care | Payer: BC Managed Care – PPO | Source: Ambulatory Visit | Attending: Gastroenterology | Admitting: Gastroenterology

## 2015-07-14 DIAGNOSIS — R1011 Right upper quadrant pain: Secondary | ICD-10-CM

## 2015-07-14 DIAGNOSIS — R1013 Epigastric pain: Secondary | ICD-10-CM

## 2015-07-14 DIAGNOSIS — R110 Nausea: Secondary | ICD-10-CM

## 2015-07-21 ENCOUNTER — Other Ambulatory Visit: Payer: Self-pay | Admitting: Interventional Cardiology

## 2015-09-24 ENCOUNTER — Encounter: Payer: Self-pay | Admitting: Interventional Cardiology

## 2015-10-06 ENCOUNTER — Other Ambulatory Visit: Payer: Self-pay | Admitting: Family Medicine

## 2015-10-06 DIAGNOSIS — Z1231 Encounter for screening mammogram for malignant neoplasm of breast: Secondary | ICD-10-CM

## 2015-10-13 ENCOUNTER — Ambulatory Visit: Payer: BC Managed Care – PPO

## 2015-10-15 ENCOUNTER — Ambulatory Visit
Admission: RE | Admit: 2015-10-15 | Discharge: 2015-10-15 | Disposition: A | Discharge status: Home / Self Care | Payer: BC Managed Care – PPO | Source: Ambulatory Visit | Attending: Family Medicine | Admitting: Family Medicine

## 2015-10-15 DIAGNOSIS — Z1231 Encounter for screening mammogram for malignant neoplasm of breast: Secondary | ICD-10-CM

## 2015-11-14 ENCOUNTER — Other Ambulatory Visit: Payer: Self-pay | Admitting: Interventional Cardiology

## 2015-11-14 DIAGNOSIS — I10 Essential (primary) hypertension: Secondary | ICD-10-CM

## 2015-11-23 ENCOUNTER — Other Ambulatory Visit: Payer: Self-pay | Admitting: Interventional Cardiology

## 2015-11-23 DIAGNOSIS — I10 Essential (primary) hypertension: Secondary | ICD-10-CM

## 2015-11-28 ENCOUNTER — Encounter: Payer: Self-pay | Admitting: Interventional Cardiology

## 2015-12-15 ENCOUNTER — Encounter: Payer: Self-pay | Admitting: Interventional Cardiology

## 2015-12-16 ENCOUNTER — Encounter: Payer: Self-pay | Admitting: Interventional Cardiology

## 2015-12-16 ENCOUNTER — Telehealth: Payer: Self-pay | Admitting: Interventional Cardiology

## 2015-12-16 ENCOUNTER — Encounter (INDEPENDENT_AMBULATORY_CARE_PROVIDER_SITE_OTHER): Payer: Self-pay

## 2015-12-16 ENCOUNTER — Ambulatory Visit (INDEPENDENT_AMBULATORY_CARE_PROVIDER_SITE_OTHER): Payer: BC Managed Care – PPO | Admitting: Interventional Cardiology

## 2015-12-16 VITALS — BP 146/76 | HR 71 | Ht 66.0 in | Wt 204.6 lb

## 2015-12-16 DIAGNOSIS — I2510 Atherosclerotic heart disease of native coronary artery without angina pectoris: Secondary | ICD-10-CM

## 2015-12-16 DIAGNOSIS — E785 Hyperlipidemia, unspecified: Secondary | ICD-10-CM | POA: Diagnosis not present

## 2015-12-16 DIAGNOSIS — I10 Essential (primary) hypertension: Secondary | ICD-10-CM | POA: Diagnosis not present

## 2015-12-16 DIAGNOSIS — I739 Peripheral vascular disease, unspecified: Secondary | ICD-10-CM | POA: Diagnosis not present

## 2015-12-16 NOTE — Telephone Encounter (Signed)
Attempted to call pt back.  VM is not set up.  Unable to leave message.    Dr. Katrinka BlazingSmith said the name of the water is Quinine water.

## 2015-12-16 NOTE — Telephone Encounter (Signed)
New message    Pt verbalized that she is  Calling to speak to the rn  She wants to ask Dr.Smith what was the name of the water that he recommended for her to take,when she is having cramps

## 2015-12-16 NOTE — Progress Notes (Signed)
Cardiology Office Note    Date:  12/16/2015   ID:  Ishi, Danser 10/27/1945, MRN [id number]  PCP:  Cain Saupe, MD  Cardiologist: Lesleigh Noe, MD   Chief Complaint  Patient presents with  . Coronary Artery Disease    History of Present Illness:  Dawn Wood is a 70 y.o. female  who presents for CAD, diabetes mellitus, prior LAD stent, hypertension, and hyperlipidemia.  No CV complaints. Having terrible cramping in her hands feet thighs and calves. She denies orthopnea, PND, and significant dyspnea on exertion. No episodes of syncope. Overall she feels well   Past Medical History:  Diagnosis Date  . Coronary artery disease   . Diabetes mellitus without complication (HCC)   . GERD (gastroesophageal reflux disease)   . Hypertension   . MI (myocardial infarction)   . Peripheral vascular disease [facility])     Past Surgical History:  Procedure Laterality Date  . ABDOMINAL HYSTERECTOMY    . CORONARY STENT PLACEMENT      Current Medications: Outpatient Medications Prior to Visit  Medication Sig Dispense Refill  . amLODipine (NORVASC) 5 MG tablet TAKE 1 TABLET BY MOUTH EVERY DAY 90 tablet 3  . aspirin 81 MG tablet Take 81 mg by mouth daily.    Marland Kitchen atorvastatin (LIPITOR) 20 MG tablet TAKE 1 TABLET BY MOUTH DAILY 90 tablet 1  . Cholecalciferol (VITAMIN D) 2000 UNITS CAPS Take 1 capsule by mouth daily.    . clopidogrel (PLAVIX) 75 MG tablet TAKE 1 TABLET BY MOUTH EVERY DAY 90 tablet 2  . fluticasone (FLONASE) 50 MCG/ACT nasal spray Place 1 spray into both nostrils daily as needed for allergies or rhinitis.    Marland Kitchen insulin aspart protamine- aspart (NOVOLOG MIX 70/30) (70-30) 100 UNIT/ML injection Inject 40 Units into the skin 2 (two) times daily with a meal.     . metoprolol succinate (TOPROL-XL) 100 MG 24 hr tablet Take 1 tablet (100 mg total) by mouth daily. Take with or immediately following a meal.    . nitroGLYCERIN (NITROSTAT) 0.4 MG SL tablet Place 0.4 mg under  the tongue every 5 (five) minutes as needed for chest pain.    Marland Kitchen spironolactone-hydrochlorothiazide (ALDACTAZIDE) 25-25 MG tablet TAKE 1 TABLET BY MOUTH DAILY 30 tablet 6  . valsartan (DIOVAN) 320 MG tablet TAKE 1 TABLET BY MOUTH DAILY 30 tablet 6  . metFORMIN (GLUMETZA) 500 MG (MOD) 24 hr tablet Take 500 mg by mouth 2 (two) times daily with a meal. 1 tablets in the morning and 2 tablets in the evening    . omeprazole (PRILOSEC) 20 MG capsule Take 20 mg by mouth daily.    . valsartan (DIOVAN) 320 MG tablet TAKE 1 TABLET BY MOUTH DAILY (Patient not taking: Reported on 12/16/2015) 30 tablet 5   No facility-administered medications prior to visit.      Allergies:   Dust mite extract; Other; and Pollen extract   Social History   Social History  . Marital status: Single    Spouse name: N/A  . Number of children: N/A  . Years of education: N/A   Social History Main Topics  . Smoking status: Former Games developer  . Smokeless tobacco: Never Used  . Alcohol use 0.0 oz/week     Comment: occ  . Drug use: No  . Sexual activity: Not Asked   Other Topics Concern  . None   Social History Narrative  . None     Family History:  The  patient's family history includes Aortic stenosis in her daughter; Cancer in her father and sister; Heart disease in her daughter; Hypertension in her father and mother.   ROS:   Please see the history of present illness.    Explain weight gain. Cramping in legs and hands. Magnesium supplements have been started by Dr. Horald PollenBalen  All other systems reviewed and are negative.   PHYSICAL EXAM:   VS:  BP (!) 146/76   Pulse 71   Ht 5\' 6"  (1.676 m)   Wt 204 lb 9.6 oz (92.8 kg)   BMI 33.02 kg/m    GEN: Well nourished, well developed, in no acute distress. Obese  HEENT: normal  Neck: no JVD, carotid bruits, or masses Cardiac: RRR; no murmurs, rubs, or gallops,no edema  Respiratory:  clear to auscultation bilaterally, normal work of breathing GI: soft, nontender,  nondistended, + BS MS: no deformity or atrophy  Skin: warm and dry, no rash Neuro:  Alert and Oriented x 3, Strength and sensation are intact Psych: euthymic mood, full affect  Wt Readings from Last 3 Encounters:  12/16/15 204 lb 9.6 oz (92.8 kg)  06/09/15 201 lb 3.2 oz (91.3 kg)  04/09/15 194 lb (88 kg)      Studies/Labs Reviewed:   EKG:  EKG  Performed in April revealed normal sinus rhythm, left axis deviation.  Recent Labs: No results found for requested labs within last 8760 hours.   Lipid Panel    Component Value Date/Time   CHOL 132 12/02/2014 0856   CHOL 167 05/28/2013 0413   TRIG 110.0 12/02/2014 0856   TRIG 180 05/28/2013 0413   HDL 38.90 (L) 12/02/2014 0856   HDL 37 (L) 05/28/2013 0413   CHOLHDL 3 12/02/2014 0856   VLDL 22.0 12/02/2014 0856   VLDL 36 05/28/2013 0413   LDLCALC 71 12/02/2014 0856   LDLCALC 94 05/28/2013 0413    Additional studies/ records that were reviewed today include:  No new data is available.    ASSESSMENT:    1. CAD in native artery   2. Essential hypertension   3. PVD (peripheral vascular disease) (HCC)   4. Dyslipidemia      PLAN:  In order of problems listed above:  1. Asymptomatic with reference to angina. Aerobic exercise and weight loss is recommended. 2. 2 g sodium diet, aerobic activity, and weight loss. 3. Exercises applicator for lower extremity claudication. 4. Low-fat diet, weight loss, and continued statin therapy. Followed by her primary care.     Medication Adjustments/Labs and Tests Ordered: Current medicines are reviewed at length with the patient today.  Concerns regarding medicines are outlined above.  Medication changes, Labs and Tests ordered today are listed in the Patient Instructions below. There are no Patient Instructions on file for this visit.   Signed, Lesleigh NoeHenry W Stephano Arrants III, MD  12/16/2015 3:52 PM    [facility] [address], [place], [place]  [address] Phone: [phone])  [phone]; Fax: [phone]) [phone]

## 2015-12-16 NOTE — Patient Instructions (Signed)
Medication Instructions:  None  Labwork: None  Testing/Procedures: None  Follow-Up: Your physician wants you to follow-up in: 6 months with Dr. Katrinka BlazingSmith. You will receive a reminder letter in the mail two months in advance. If you don't receive a letter, please call our office to schedule the follow-up appointment.   Any Other Special Instructions Will Be Listed Below (If Applicable).  Your physician discussed the importance of regular exercise and recommended that you start or continue a regular exercise program for good health.    If you need a refill on your cardiac medications before your next appointment, please call your pharmacy.

## 2015-12-17 NOTE — Telephone Encounter (Signed)
Attempted to call pt again. Got message stating VM not set up.

## 2015-12-22 NOTE — Telephone Encounter (Signed)
LMTCB

## 2015-12-23 NOTE — Telephone Encounter (Signed)
Pt is aware that Dr Katrinka BlazingSmith that the name of the water he recommends for her to drink is Quinine water. Tonic water which  is the same. Pt verbalized understanding.

## 2015-12-23 NOTE — Telephone Encounter (Signed)
Mrs. Dawn Wood is returning a call . Please call [phone]

## 2016-03-08 HISTORY — PX: KNEE ARTHROSCOPY: SUR90

## 2016-03-12 ENCOUNTER — Other Ambulatory Visit: Payer: Self-pay | Admitting: Interventional Cardiology

## 2016-04-05 ENCOUNTER — Other Ambulatory Visit: Payer: Self-pay | Admitting: Interventional Cardiology

## 2016-04-09 ENCOUNTER — Encounter: Payer: Self-pay | Admitting: Family

## 2016-04-14 ENCOUNTER — Other Ambulatory Visit: Payer: Self-pay | Admitting: *Deleted

## 2016-04-14 DIAGNOSIS — I739 Peripheral vascular disease, unspecified: Secondary | ICD-10-CM

## 2016-04-15 ENCOUNTER — Ambulatory Visit (HOSPITAL_COMMUNITY): Payer: BC Managed Care – PPO

## 2016-04-15 ENCOUNTER — Ambulatory Visit: Payer: BC Managed Care – PPO | Admitting: Family

## 2016-04-18 ENCOUNTER — Other Ambulatory Visit: Payer: Self-pay | Admitting: Interventional Cardiology

## 2016-05-28 ENCOUNTER — Encounter: Payer: Self-pay | Admitting: Family

## 2016-06-03 ENCOUNTER — Encounter: Payer: Self-pay | Admitting: Family

## 2016-06-03 ENCOUNTER — Ambulatory Visit (HOSPITAL_COMMUNITY)
Admission: RE | Admit: 2016-06-03 | Discharge: 2016-06-03 | Disposition: A | Discharge status: Home / Self Care | Payer: BC Managed Care – PPO | Source: Ambulatory Visit | Attending: Vascular Surgery | Admitting: Vascular Surgery

## 2016-06-03 ENCOUNTER — Ambulatory Visit (INDEPENDENT_AMBULATORY_CARE_PROVIDER_SITE_OTHER): Payer: BC Managed Care – PPO | Admitting: Family

## 2016-06-03 VITALS — BP 135/80 | HR 79 | Temp 98.8°F | Resp 16 | Ht 66.0 in | Wt 203.0 lb

## 2016-06-03 DIAGNOSIS — E119 Type 2 diabetes mellitus without complications: Secondary | ICD-10-CM | POA: Insufficient documentation

## 2016-06-03 DIAGNOSIS — Z87891 Personal history of nicotine dependence: Secondary | ICD-10-CM | POA: Diagnosis not present

## 2016-06-03 DIAGNOSIS — I10 Essential (primary) hypertension: Secondary | ICD-10-CM | POA: Insufficient documentation

## 2016-06-03 DIAGNOSIS — E1151 Type 2 diabetes mellitus with diabetic peripheral angiopathy without gangrene: Secondary | ICD-10-CM

## 2016-06-03 DIAGNOSIS — E785 Hyperlipidemia, unspecified: Secondary | ICD-10-CM | POA: Diagnosis not present

## 2016-06-03 DIAGNOSIS — I70212 Atherosclerosis of native arteries of extremities with intermittent claudication, left leg: Secondary | ICD-10-CM

## 2016-06-03 DIAGNOSIS — I739 Peripheral vascular disease, unspecified: Secondary | ICD-10-CM

## 2016-06-03 NOTE — Progress Notes (Signed)
[facility] [place]   CC: Follow up peripheral artery occlusive disease  History of Present Illness Dawn Wood is a 71 y.o. female patient of Dr. 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 Dr. Darrick Penna last evaluated pt in April of 2016. At that time her left leg was essentially asymptomatic. The patient did not feel disabled by this. She was happy with her walking distance. She prefered conservative measures with risk factor modification and a walking program at that point. Dr. Darrick Penna reassured her that her lifetime risk of limb loss if she continues to refrain from smoking and her diabetes is controlled is less than 5%. He also discussed with her the possibility of an intervention to improve her walking distance. She prefered conservative management at that point. Dr. Darrick Penna advised pt to return in 6 months for evaluation.  She occasionally has some pain in her right groin and inner thigh which she thinks is related to her previous cardiac catheterization in 2015. She denies any pain in her left foot.  Other medical problems include coronary artery disease with previous stenting, diabetes, hypertension. Her hypertension and coronary disease have been stable. However she states her glucose control is improved. She is a former smoker quit in 2012. She is on Plavix.  She injured her right arm in 2015, is receiving therapy for this.  About 2 weeks ago she noticed aching in her left upper arm, feels similar to right arm pain, pain is worse when she tries to elevate arm above shoulder. She denies jaw pain, denies chest pain, denies dyspnea.   She has left calf claudication after walking 30 minutes, relieved with rest, denies claudication in right leg. She denies non healing wounds.   Pt Diabetic: Yes, states her last A1C was 7.5, April 2018.  Pt smoker: former smoker, quit in 2012, had smoked for 45 years  Pt meds include: Statin :Yes, frequency  of dosing was decreased due to myalgias in her hips and thighs Betablocker: Yes ASA: Yes Other anticoagulants/antiplatelets: Plavix    Past Medical History:  Diagnosis Date  . Coronary artery disease   . Diabetes mellitus without complication (HCC)   . GERD (gastroesophageal reflux disease)   . Hypertension   . MI (myocardial infarction)   . Peripheral vascular disease [facility])     Social History Social History  Substance Use Topics  . Smoking status: Former Games developer  . Smokeless tobacco: Never Used  . Alcohol use 0.0 oz/week     Comment: occ    Family History Family History  Problem Relation Age of Onset  . Aortic stenosis Daughter   . Heart disease Daughter     before age 73  . Hypertension Mother   . Cancer Father   . Hypertension Father   . Cancer Sister     Past Surgical History:  Procedure Laterality Date  . ABDOMINAL HYSTERECTOMY    . CORONARY STENT PLACEMENT      Allergies  Allergen Reactions  . Dust Mite Extract Other (See Comments)    REACTION: SNEEZING  . Other Other (See Comments)    ALLERGEN: DEODORIZERS SPRAY AND PERFUMES REACTION: SNEEZING  . Pollen Extract Other (See Comments)    Reaction unknown    Current Outpatient Prescriptions  Medication Sig Dispense Refill  . amLODipine (NORVASC) 10 MG tablet Take 10 mg by mouth daily.    Marland Kitchen aspirin 81 MG tablet Take 81 mg by mouth daily.    Marland Kitchen  atorvastatin (LIPITOR) 20 MG tablet TAKE 1 TABLET BY MOUTH DAILY (Patient taking differently: TAKE 1 TABLET every Monday, Wednessday, and Friday.) 90 tablet 3  . Cholecalciferol (VITAMIN D3) 50000 units TABS Take by mouth.    . clopidogrel (PLAVIX) 75 MG tablet TAKE 1 TABLET BY MOUTH EVERY DAY 90 tablet 2  . fluticasone (FLONASE) 50 MCG/ACT nasal spray Place 1 spray into both nostrils daily as needed for allergies or rhinitis.    Marland Kitchen. insulin aspart protamine- aspart (NOVOLOG MIX 70/30) (70-30) 100 UNIT/ML injection Inject 40 Units into the skin 2 (two) times daily  with a meal.     . metFORMIN (GLUMETZA) 500 MG (MOD) 24 hr tablet Take one (1) tablet (500 mg) by mouth each morning and two (2) tablets (1,000 mg) by mouth each evening.    . metoprolol succinate (TOPROL-XL) 100 MG 24 hr tablet Take 1 tablet (100 mg total) by mouth daily. Take with or immediately following a meal.    . nitroGLYCERIN (NITROSTAT) 0.4 MG SL tablet Place 0.4 mg under the tongue every 5 (five) minutes as needed for chest pain.    Marland Kitchen. spironolactone-hydrochlorothiazide (ALDACTAZIDE) 25-25 MG tablet TAKE 1 TABLET BY MOUTH DAILY 30 tablet 6  . valsartan (DIOVAN) 320 MG tablet TAKE 1 TABLET BY MOUTH DAILY 30 tablet 6   No current facility-administered medications for this visit.     ROS: See HPI for pertinent positives and negatives.   Physical Examination  Vitals:   06/03/16 1216 06/03/16 1218  BP: 127/77 135/80  Pulse: 79   Resp: 16   Temp: 98.8 F (37.1 C)   TempSrc: Oral   SpO2: 99%   Weight: 203 lb (92.1 kg)   Height: 5\' 6"  (1.676 m)    Body mass index is 32.77 kg/m.  General:A&O x 3, WDWN obese female. Gait: normal Eyes: PERRLA. Pulmonary: Respirations are non labored, CTAB, no wheezes, rales, or rhonchi. Cardiac: regular rhythm, no detected murmur.         Carotid Bruits Right Left   Negative Negative  Aorta is not palpable. Radial pulses: 2+ palpable and =                           VASCULAR EXAM: Extremities no ischemic changes, no Gangrene; no open wounds.                                                                                                                                                       LE Pulses Right Left       FEMORAL  not palpable  1+ palpable        POPLITEAL  not palpable   not palpable       POSTERIOR TIBIAL  2+ palpable   not palpable  DORSALIS PEDIS      ANTERIOR TIBIAL 1+ palpable  not palpable    Abdomen: soft, NT, no palpable masses. Skin: no rashes, See Extremities. Musculoskeletal: no muscle  wasting or atrophy.         Neurologic: A&O X 3; Appropriate Affect, MOTOR FUNCTION:  moving all extremities equally, motor strength 5/5 throughout. Speech is fluent/normal.  CN 2-12 intact.    ASSESSMENT: Dawn Wood is a 71 y.o. female who has mild claudication in her left calf, no claudication symptoms in her right leg, no signs of ischemia in her feet legs. She quit smoking in 2012, had smoked for 45 years. She has DM which is almost in control with a self reported A1C of 7.5.  About 2 weeks ago she noticed aching in her left upper arm, feels similar to right arm pain, pain is worse when she tries to elevate arm above shoulder. She denies jaw pain, denies chest pain, denies dyspnea. I advised her to notify Dr. Corky Sing office of these sx's today, and let that office advise her.   DATA (06/03/16):  ABI's : Right: 1.02 (1.1, 04-09-15), waveforms: PT: biphasic, DP: monophasic; TBI: 0.81 Left: 0.64 (0.66, 04-09-15), waveforms: PT: biphasic, DP: monophasic; TBI: 0.52 Stable bilateral ABI; no evidence of arterial occlusive disease in the right LE, moderate disease in the left.    PLAN:  Graduated walking program discussed and how to achieve. Based on the patient's vascular studies and examination, pt will return to clinic in 1 year with ABI's. I advised her to notify us if she develops concerns re the circulation in her feet/legs.   I discussed in depth with the patient the nature of atherosclerosis, and emphasized the importance of maximal medical management including strict control of blood pressure, blood glucose, and lipid levels, obtaining regular exercise, and continued cessation of smoking.  The patient is aware that without maximal medical management the underlying atherosclerotic disease process will progress, limiting the benefit of any interventions.  The patient was given information about PAD including signs, symptoms, treatment, what symptoms should prompt the patient  to seek immediate medical care, and risk reduction measures to take.  Charisse March, RN, MSN, FNP-C [facility] [place] Phone: [phone]  Clinic MD: [facility]  06/03/16 12:26 PM

## 2016-06-03 NOTE — Patient Instructions (Signed)

## 2016-06-07 ENCOUNTER — Other Ambulatory Visit: Payer: Self-pay | Admitting: Interventional Cardiology

## 2016-06-07 DIAGNOSIS — I10 Essential (primary) hypertension: Secondary | ICD-10-CM

## 2016-06-16 ENCOUNTER — Ambulatory Visit (INDEPENDENT_AMBULATORY_CARE_PROVIDER_SITE_OTHER): Payer: BC Managed Care – PPO | Admitting: Interventional Cardiology

## 2016-06-16 ENCOUNTER — Encounter: Payer: Self-pay | Admitting: Interventional Cardiology

## 2016-06-16 VITALS — BP 130/80 | HR 67 | Ht 66.0 in | Wt 205.0 lb

## 2016-06-16 DIAGNOSIS — E1159 Type 2 diabetes mellitus with other circulatory complications: Secondary | ICD-10-CM | POA: Diagnosis not present

## 2016-06-16 DIAGNOSIS — E785 Hyperlipidemia, unspecified: Secondary | ICD-10-CM

## 2016-06-16 DIAGNOSIS — I739 Peripheral vascular disease, unspecified: Secondary | ICD-10-CM | POA: Diagnosis not present

## 2016-06-16 DIAGNOSIS — I2510 Atherosclerotic heart disease of native coronary artery without angina pectoris: Secondary | ICD-10-CM

## 2016-06-16 DIAGNOSIS — I10 Essential (primary) hypertension: Secondary | ICD-10-CM | POA: Diagnosis not present

## 2016-06-16 DIAGNOSIS — IMO0002 Reserved for concepts with insufficient information to code with codable children: Secondary | ICD-10-CM

## 2016-06-16 DIAGNOSIS — E1165 Type 2 diabetes mellitus with hyperglycemia: Secondary | ICD-10-CM

## 2016-06-16 MED ORDER — NITROGLYCERIN 0.4 MG SL SUBL: 0.4000 mg | SUBLINGUAL_TABLET | SUBLINGUAL | 3 refills | Status: DC | PRN

## 2016-06-16 NOTE — Progress Notes (Signed)
Cardiology Office Note    Date:  06/16/2016   ID:  Kyeshia, Zinn March 18, 1945, MRN [id number]  PCP:  [name], MD  Cardiologist: Lesleigh Noe, MD   Chief Complaint  Patient presents with  . Coronary Artery Disease   Her nitroglycerin is expired. History of Present Illness:  Dawn Wood is a 71 y.o. female who presents for CAD With prior LAD DE stent 2015 Dr. Park Breed, hypertension, and hyperlipidemia  She has not sleeping well. She is having left shoulder pain. She states that her vascular surgeon told her to be certain she mentioned this complaint to her cardiologist. The discomfort is position related. There is palpable tenderness. There is no exertional component. She is not having chest pain. She denies palpitations and syncope. She has not needed to use nitroglycerin.  Past Medical History:  Diagnosis Date  . Coronary artery disease   . Diabetes mellitus without complication (HCC)   . GERD (gastroesophageal reflux disease)   . Hypertension   . MI (myocardial infarction)   . Peripheral vascular disease [facility])     Past Surgical History:  Procedure Laterality Date  . ABDOMINAL HYSTERECTOMY    . CORONARY STENT PLACEMENT      Current Medications: Outpatient Medications Prior to Visit  Medication Sig Dispense Refill  . amLODipine (NORVASC) 10 MG tablet Take 10 mg by mouth daily.    Marland Kitchen aspirin 81 MG tablet Take 81 mg by mouth daily.    Marland Kitchen atorvastatin (LIPITOR) 20 MG tablet TAKE 1 TABLET BY MOUTH DAILY (Patient taking differently: TAKE 1 TABLET every Monday, Wednessday, and Friday.) 90 tablet 3  . Cholecalciferol (VITAMIN D3) 50000 units TABS Take by mouth.    . clopidogrel (PLAVIX) 75 MG tablet TAKE 1 TABLET BY MOUTH EVERY DAY 90 tablet 2  . fluticasone (FLONASE) 50 MCG/ACT nasal spray Place 1 spray into both nostrils daily as needed for allergies or rhinitis.    Marland Kitchen insulin aspart protamine- aspart (NOVOLOG MIX 70/30) (70-30) 100 UNIT/ML injection Inject 40  Units into the skin 2 (two) times daily with a meal.     . metFORMIN (GLUMETZA) 500 MG (MOD) 24 hr tablet Take one (1) tablet (500 mg) by mouth each morning and two (2) tablets (1,000 mg) by mouth each evening.    . metoprolol succinate (TOPROL-XL) 100 MG 24 hr tablet Take 1 tablet (100 mg total) by mouth daily. Take with or immediately following a meal.    . spironolactone-hydrochlorothiazide (ALDACTAZIDE) 25-25 MG tablet TAKE 1 TABLET BY MOUTH DAILY 30 tablet 5  . valsartan (DIOVAN) 320 MG tablet TAKE 1 TABLET BY MOUTH DAILY 30 tablet 6  . nitroGLYCERIN (NITROSTAT) 0.4 MG SL tablet Place 0.4 mg under the tongue every 5 (five) minutes as needed for chest pain.     No facility-administered medications prior to visit.      Allergies:   Dust mite extract; Other; and Pollen extract   Social History   Social History  . Marital status: Single    Spouse name: N/A  . Number of children: N/A  . Years of education: N/A   Social History Main Topics  . Smoking status: Former Games developer  . Smokeless tobacco: Never Used  . Alcohol use 0.0 oz/week     Comment: occ  . Drug use: No  . Sexual activity: Not Asked   Other Topics Concern  . None   Social History Narrative  . None     Family History:  The patient's family history includes Aortic stenosis in her daughter; Cancer in her father and sister; Heart disease in her daughter; Hypertension in her father and mother.   ROS:   Please see the history of present illness.    Back discomfort, muscle pain, left lower chest pain occurs when she is watching/bathing and attempts to scrub her back. She is having difficulty falling off to sleep. She is under stress. She has bursitis in both hips. Lower extremities or swelling. Recent increase in amlodipine dose to 10 mg per day. The swelling coincides with the dose increase. All other systems reviewed and are negative.   PHYSICAL EXAM:   VS:  BP 130/80 (BP Location: Left Arm, Patient Position: Sitting,  Cuff Size: Large)   Pulse 67   Ht  (1.676 m)   Wt 205 lb (93 kg)   SpO2 97%   BMI 33.09 kg/m    GEN: Well nourished, well developed, in no acute distress  HEENT: normal  Neck: no JVD, carotid bruits, or masses Cardiac: RRR; no murmurs, rubs, or gallops,no edema  Respiratory:  clear to auscultation bilaterally, normal work of breathing GI: soft, nontender, nondistended, + BS MS: no deformity or atrophy  Skin: warm and dry, no rash Neuro:  Alert and Oriented x 3, Strength and sensation are intact Psych: euthymic mood, full affect  Wt Readings from Last 3 Encounters:  06/16/16 205 lb (93 kg)  06/03/16 203 lb (92.1 kg)  12/16/15 204 lb 9.6 oz (92.8 kg)      Studies/Labs Reviewed:   EKG:  EKG  Normal sinus rhythm, left axis deviation, left atrial abnormality. No change from prior.  Recent Labs: No results found for requested labs within last 8760 hours.   Lipid Panel    Component Value Date/Time   CHOL 132 12/02/2014 0856   CHOL 167 05/28/2013 0413   TRIG 110.0 12/02/2014 0856   TRIG 180 05/28/2013 0413   HDL 38.90 (L) 12/02/2014 0856   HDL 37 (L) 05/28/2013 0413   CHOLHDL 3 12/02/2014 0856   VLDL 22.0 12/02/2014 0856   VLDL 36 05/28/2013 0413   LDLCALC 71 12/02/2014 0856   LDLCALC 94 05/28/2013 0413    Additional studies/ records that were reviewed today include:  No new functional or imaging data.    ASSESSMENT:    1. CAD in native artery   2. PVD (peripheral vascular disease) (HCC)   3. Essential hypertension   4. Uncontrolled type 2 diabetes mellitus with other circulatory complication, without long-term current use of insulin (HCC)   5. Dyslipidemia      PLAN:  In order of problems listed above:  1. Denies angina. No nitroglycerin use. New prescription for nitroglycerin is given to update that which has expired. 2. Encouraged aerobic activity to build collaterals. 3. Encouraged patient to continue with the 10 mg of amlodipine however, if the  lower extremity swelling becomes bothersome we will need to go back to 5 mg per day and find other ways to treat. 4. Followed by primary care 5. LDL target is less than 70.  Overall doing well. Has not been as active with aerobic activity over the past interval due to hip and shoulder arthritic conditions. Encouraged aerobic activity, caloric adjustment, and weight loss. Clinical follow-up 1 year.  Medication Adjustments/Labs and Tests Ordered: Current medicines are reviewed at length with the patient today.  Concerns regarding medicines are outlined above.  Medication changes, Labs and Tests ordered today are listed in the  Patient Instructions below. Patient Instructions  Medication Instructions:  None  Labwork: None  Testing/Procedures: None  Follow-Up: Your physician wants you to follow-up in: 1 year with Dr. Katrinka Blazing.  You will receive a reminder letter in the mail two months in advance. If you don't receive a letter, please call our office to schedule the follow-up appointment.   Any Other Special Instructions Will Be Listed Below (If Applicable).     If you need a refill on your cardiac medications before your next appointment, please call your pharmacy.      Signed, Lesleigh Noe, MD  06/16/2016 4:57 PM    [facility] [address] [place], [place], [place]  [address] Phone: [phone]; Fax: [phone]

## 2016-06-16 NOTE — Patient Instructions (Signed)

## 2016-06-17 ENCOUNTER — Telehealth: Payer: Self-pay | Admitting: Interventional Cardiology

## 2016-06-17 MED ORDER — AMLODIPINE BESYLATE 5 MG PO TABS: 5.0000 mg | ORAL_TABLET | Freq: Every day | ORAL | 3 refills | Status: DC

## 2016-06-17 NOTE — Telephone Encounter (Signed)
New Message  Pt voiced wanting to speak with nurse and to have nurse to give her a call back.  Please f/u

## 2016-06-17 NOTE — Telephone Encounter (Signed)
Patient calling and states that she no longer wants to take the amlodipine 10 mg daily due to the increased swelling in her ankles. She states that she is going to take the amlodipine 5 mg daily. Per OV note yesterday this was discussed with patient. Patient requesting a prescription for the 5 mg tablets. Prescription sent to patient's preferred pharmacy. Patient advised to continue to monitor BP with this change. Patient verbalized understanding.

## 2016-06-19 NOTE — Telephone Encounter (Signed)
okay

## 2016-06-28 ENCOUNTER — Other Ambulatory Visit: Payer: Self-pay | Admitting: Interventional Cardiology

## 2016-06-28 DIAGNOSIS — I10 Essential (primary) hypertension: Secondary | ICD-10-CM

## 2016-10-22 ENCOUNTER — Other Ambulatory Visit: Payer: Self-pay | Admitting: Internal Medicine

## 2016-10-22 DIAGNOSIS — Z1231 Encounter for screening mammogram for malignant neoplasm of breast: Secondary | ICD-10-CM

## 2016-11-02 ENCOUNTER — Ambulatory Visit
Admission: RE | Admit: 2016-11-02 | Discharge: 2016-11-02 | Disposition: A | Discharge status: Home / Self Care | Payer: BC Managed Care – PPO | Source: Ambulatory Visit | Attending: Internal Medicine | Admitting: Internal Medicine

## 2016-11-02 DIAGNOSIS — Z1231 Encounter for screening mammogram for malignant neoplasm of breast: Secondary | ICD-10-CM

## 2016-11-10 ENCOUNTER — Other Ambulatory Visit: Payer: Self-pay | Admitting: Internal Medicine

## 2016-11-10 DIAGNOSIS — R1011 Right upper quadrant pain: Secondary | ICD-10-CM

## 2016-11-18 ENCOUNTER — Ambulatory Visit
Admission: RE | Admit: 2016-11-18 | Discharge: 2016-11-18 | Disposition: A | Discharge status: Home / Self Care | Payer: BC Managed Care – PPO | Source: Ambulatory Visit | Attending: Internal Medicine | Admitting: Internal Medicine

## 2016-11-18 DIAGNOSIS — R1011 Right upper quadrant pain: Secondary | ICD-10-CM

## 2016-12-05 ENCOUNTER — Other Ambulatory Visit: Payer: Self-pay | Admitting: Interventional Cardiology

## 2016-12-05 DIAGNOSIS — I10 Essential (primary) hypertension: Secondary | ICD-10-CM

## 2016-12-17 ENCOUNTER — Other Ambulatory Visit: Payer: Self-pay | Admitting: Internal Medicine

## 2016-12-17 DIAGNOSIS — E2839 Other primary ovarian failure: Secondary | ICD-10-CM

## 2016-12-27 ENCOUNTER — Telehealth: Payer: Self-pay

## 2016-12-27 NOTE — Telephone Encounter (Signed)
   [facility] Pre-operative Risk Assessment    Request for surgical clearance:  1. What type of surgery is being performed? Left Knee Scope   2. When is this surgery scheduled? Pending   3. Are there any medications that need to be held prior to surgery and how long? Plavix Asa 72m How long before surgery do these medications need to be discontinued?   4. Practice name and name of physician performing surgery? [facility]/ Dr. TFredonia Highland  5. What is your office phone and fax number? Ph [phone] [phone] KClaiborne Billings  6. Anesthesia type (None, local, MAC, general) ?    MTod Persia10/22/2018, 3:17 PM  _________________________________________________________________   (provider comments below)

## 2016-12-27 NOTE — Telephone Encounter (Signed)
    Chart reviewed as part of pre-operative protocol coverage. Patient was contacted 12/27/2016 in reference to pre-operative risk assessment for pending surgery as outlined below.  Dawn Wood was last seen on 06/16/16 by Dr. Katrinka Wood.  Since that day, Dawn Wood has done well.  She received a phone call and denies chest pain, shortness of breath, palpitations, dizziness, and syncope. She has no new symptoms since her last appt.  According to the Revised Cardiac Risk Index (RCRI), she has a 6.6% perioperative risk of major cardiac events (hx of ischemic heart disease and insulin).   She is taking ASA and plavix. Indications include CAD and PVD. She follows with both cardiology and vascular surgery. From a cardiac standpoint, she is OK to stop plavix for 5 days prior to surgery. Continue ASA. Resume plavix following surgery at the surgeon's discretion.  Therefore, based on ACC/AHA guidelines, the patient would be at acceptable risk for the planned procedure without further cardiovascular testing.   However, patient states that she is not having knee surgery this year. She states she wants to have cataract surgery this year and knee surgery next year.  Dawn RutherfordAngela Nicole Duke, PA 12/27/2016, 4:09 PM

## 2016-12-27 NOTE — Telephone Encounter (Signed)
Faxed via EPIC  Dawn Harness[facility]phy [facility]/ Dr. Renaye Rakersim Murphy  ATTN:Kelly

## 2017-01-07 ENCOUNTER — Ambulatory Visit
Admission: RE | Admit: 2017-01-07 | Discharge: 2017-01-07 | Disposition: A | Discharge status: Home / Self Care | Payer: BC Managed Care – PPO | Source: Ambulatory Visit | Attending: Internal Medicine | Admitting: Internal Medicine

## 2017-01-07 DIAGNOSIS — E2839 Other primary ovarian failure: Secondary | ICD-10-CM

## 2017-02-01 ENCOUNTER — Telehealth: Payer: Self-pay | Admitting: Nurse Practitioner

## 2017-02-01 ENCOUNTER — Telehealth: Payer: Self-pay | Admitting: Interventional Cardiology

## 2017-02-01 NOTE — Telephone Encounter (Signed)
This patient was cleared for left knee scope on 10/22 with recommendations for holding plavix. It was then noted that she was not having the surgery until next year. We again received a request for surgical clearance. Need to clarify when the patient is planning to have her surgery.   She was last seen in the office in 06/2016 by Dr. Katrinka BlazingSmith with plans to follow up in a year. It has now been over 6 months since her last visit. She may need to be seen prior to her surgery.  I called and left a message on her VM for her to call our office and ask for the preop clinic so that we can get more information about the timing of her procedure and how she has been doing.

## 2017-02-01 NOTE — Telephone Encounter (Addendum)
   [facility] Pre-operative Risk Assessment    Request for surgical clearance:  1. What type of surgery is being performed? Left Knee Scope   2. When is this surgery scheduled? Pending   3. Are there any medications that need to be held prior to surgery and how long? Plavix - please give instructions regarding stop date and date to start back May remain on Aspirin per Dr. Theda Sers  Practice name and name of physician performing surgery? [facility], Dr.  Hart Robinsons  4. What is your office phone and fax number?  P:  [phone]      F:  [phone] ATTN: Santiago Bur  5. Anesthesia type (None, local, MAC, general) ? General with Knee block   Dawn Wood 02/01/2017, 11:21 AM  _________________________________________________________________   (provider comments below)

## 2017-02-01 NOTE — Telephone Encounter (Signed)
Walk in pt Form-[facility] Clearance Dropped off By Pt placed in Triage box to be addressed.

## 2017-02-02 ENCOUNTER — Telehealth: Payer: Self-pay | Admitting: Interventional Cardiology

## 2017-02-02 NOTE — Telephone Encounter (Signed)
lmtcb

## 2017-02-02 NOTE — Telephone Encounter (Signed)
See encounter from 02/01/17.

## 2017-02-02 NOTE — Telephone Encounter (Signed)
New message   Patient returning pre op call.

## 2017-02-03 ENCOUNTER — Telehealth: Payer: Self-pay | Admitting: Interventional Cardiology

## 2017-02-03 NOTE — Telephone Encounter (Signed)
Follow Up:    Returning a call from [place] from yesterday.

## 2017-02-04 NOTE — Telephone Encounter (Signed)
3rd attempt to reach pt re: cardiac clearance.  Left another message for pt to call back.

## 2017-02-04 NOTE — Telephone Encounter (Signed)
Pt returned my call and she has been scheduled to see Nada BoozerLaura Ingold, NP 02/08/17.

## 2017-02-07 NOTE — Progress Notes (Signed)
Cardiology Office Note   Date:  02/08/2017   ID:  Dawn Wood, DOB 1946/02/12, MRN [id number]  PCP:  [name], [name], MD  Cardiologist:  Dr. Katrinka BlazingSmith    Chief Complaint  Patient presents with  . Medical Clearance    Lt knee arthroscopy       History of Present Illness: Dawn Wood is a 71 y.o. female who presents for pre-op clearance for Lt knee scope she will remain on asa but will stop plavix .  Marland Kitchen.   Pt has a hx of CAD with prior LAD DE stent 2015  with Dr. Park BreedKahn she did have residual CAD of RCA of 60% and LCX of 40%.  She also has hypertension, and hyperlipidemia and PVD.    Today she has no cardiac complaints.  She is able to walk up 2-3 flights of steps without chest pain or SOB but does have knee pain.  Her BP is mildly elevated today but her Lt knee is painful.  She hope to have surgery in next few weeks.  She has put off any eye surgery.  She does have PAD and followed by Dr. Darrick PennaFields.  Past Medical History:  Diagnosis Date  . Coronary artery disease   . Diabetes mellitus without complication (HCC)   . GERD (gastroesophageal reflux disease)   . Hypertension   . MI (myocardial infarction) (HCC)   . Peripheral vascular disease [facility](HCC)     Past Surgical History:  Procedure Laterality Date  . ABDOMINAL HYSTERECTOMY    . CORONARY STENT PLACEMENT       Current Outpatient Medications  Medication Sig Dispense Refill  . amLODipine (NORVASC) 5 MG tablet Take 1 tablet (5 mg total) by mouth daily. 90 tablet 3  . aspirin 81 MG tablet Take 81 mg by mouth daily.    Marland Kitchen. atorvastatin (LIPITOR) 20 MG tablet TAKE 1 TABLET BY MOUTH DAILY (Patient taking differently: TAKE 1 TABLET every Monday, Wednessday, and Friday.) 90 tablet 3  . Cholecalciferol (VITAMIN D3) 2000 units TABS Take 1 tablet by mouth daily.    . clopidogrel (PLAVIX) 75 MG tablet TAKE 1 TABLET BY MOUTH EVERY DAY 90 tablet 1  . fluticasone (FLONASE) 50 MCG/ACT nasal spray Place 1 spray into both nostrils daily as  needed for allergies or rhinitis.    Marland Kitchen. insulin aspart protamine- aspart (NOVOLOG MIX 70/30) (70-30) 100 UNIT/ML injection Inject 40 Units into the skin 2 (two) times daily with a meal.     . metFORMIN (GLUMETZA) 500 MG (MOD) 24 hr tablet Take one (1) tablet (500 mg) by mouth each morning and two (2) tablets (1,000 mg) by mouth each evening.    . metoprolol succinate (TOPROL-XL) 100 MG 24 hr tablet Take 1 tablet (100 mg total) by mouth daily. Take with or immediately following a meal.    . olmesartan (BENICAR) 40 MG tablet Take 40 mg by mouth daily.    Marland Kitchen. OZEMPIC 0.25 or 0.5 MG/DOSE SOPN   5  . spironolactone-hydrochlorothiazide (ALDACTAZIDE) 25-25 MG tablet TAKE 1 TABLET BY MOUTH DAILY 30 tablet 5  . nitroGLYCERIN (NITROSTAT) 0.4 MG SL tablet Place 1 tablet (0.4 mg total) under the tongue every 5 (five) minutes as needed for chest pain. (Patient not taking: Reported on 02/08/2017) 25 tablet 3   No current facility-administered medications for this visit.     Allergies:   Dust mite extract; Other; and Pollen extract    Social History:  The patient  reports that she has  quit smoking. she has never used smokeless tobacco. She reports that she drinks alcohol. She reports that she does not use drugs.   Family History:  The patient's family history includes Aortic stenosis in her daughter; Cancer in her father and sister; Heart disease in her daughter; Hypertension in her father and mother.    ROS:  General:no colds or fevers, + weight loss Skin:no rashes or ulcers HEENT:no blurred vision, no congestion CV:see HPI PUL:see HPI GI:no diarrhea some constipation no melena, no indigestion GU:no hematuria, no dysuria MS:no joint pain, no claudication Neuro:no syncope, no lightheadedness Endo:+ diabetes on insulin, no thyroid disease  Wt Readings from Last 3 Encounters:  02/08/17 197 lb 1.9 oz (89.4 kg)  06/16/16 205 lb (93 kg)  06/03/16 203 lb (92.1 kg)     PHYSICAL EXAM: VS:  BP (!) 142/70    Pulse 85   Resp 16   Ht 5\' 5"  (1.651 m)   Wt 197 lb 1.9 oz (89.4 kg)   SpO2 97%   BMI 32.80 kg/m  , BMI Body mass index is 32.8 kg/m. General:Pleasant affect, NAD Skin:Warm and dry, brisk capillary refill HEENT:normocephalic, sclera clear, mucus membranes moist Neck:supple, no JVD, no bruits  Heart:S1S2 RRR without murmur, gallup, rub or click Lungs:clear without rales, rhonchi, or wheezes FAO:ZHYQ, non tender, + BS, do not palpate liver spleen or masses Ext:no lower ext edema, 2+ pedal pulses, 2+ radial pulses Neuro:alert and oriented X 3, MAE, follows commands, + facial symmetry    EKG:  EKG is ordered today. The ekg ordered today demonstrates SR mild LVH, no acute changes from previous EKG.    Recent Labs: No results found for requested labs within last 8760 hours.    Lipid Panel    Component Value Date/Time   CHOL 132 12/02/2014 0856   CHOL 167 05/28/2013 0413   TRIG 110.0 12/02/2014 0856   TRIG 180 05/28/2013 0413   HDL 38.90 (L) 12/02/2014 0856   HDL 37 (L) 05/28/2013 0413   CHOLHDL 3 12/02/2014 0856   VLDL 22.0 12/02/2014 0856   VLDL 36 05/28/2013 0413   LDLCALC 71 12/02/2014 0856   LDLCALC 94 05/28/2013 0413       Other studies Reviewed: Additional studies/ records that were reviewed today include: .   ASSESSMENT AND PLAN:  1.  Pre-op exam  Meets ACC guideline able to do 4 METS without pain.  Her stent was placed in 2015.  No chest pain or SOB.  EKG is stable.  She is stable for low risk surgery     Chart reviewed as part of pre-operative protocol coverage. Given past medical history and time since last visit, based on ACC/AHA guidelines, Dawn Wood would be at acceptable risk for the planned procedure without further cardiovascular testing.   She may be off Plavix for 5 days prior to procedure and be placed back on post op as condition allows.  I will route this recommendation to the requesting party via Epic fax function and remove from pre-op  pool.  Please call with questions.   2.  CAD with hx stent to LAD in 2015.  3.  HLD on statin  4.  HTN mildly elevated today due to knee pain.   She will follow up with Dr. Katrinka Blazing in April as planned.     Current medicines are reviewed with the patient today.  The patient Has no concerns regarding medicines.  The following changes have been made:  See above Labs/ tests  ordered today include:see above  Disposition:   FU:  see above  Signed, Nada BoozerLaura Ingold, NP  02/08/2017 1:15 PM    [facility] [address], [place], [place]  [address]/ [address] [place], [place] Phone: [phone]; Fax: [phone]) [phone]  [phone]

## 2017-02-08 ENCOUNTER — Ambulatory Visit: Payer: BC Managed Care – PPO | Admitting: Cardiology

## 2017-02-08 ENCOUNTER — Encounter (INDEPENDENT_AMBULATORY_CARE_PROVIDER_SITE_OTHER): Payer: Self-pay

## 2017-02-08 ENCOUNTER — Encounter: Payer: Self-pay | Admitting: Cardiology

## 2017-02-08 VITALS — BP 142/70 | HR 85 | Resp 16 | Ht 65.0 in | Wt 197.1 lb

## 2017-02-08 DIAGNOSIS — I2510 Atherosclerotic heart disease of native coronary artery without angina pectoris: Secondary | ICD-10-CM

## 2017-02-08 DIAGNOSIS — E782 Mixed hyperlipidemia: Secondary | ICD-10-CM

## 2017-02-08 DIAGNOSIS — Z01818 Encounter for other preprocedural examination: Secondary | ICD-10-CM

## 2017-02-08 DIAGNOSIS — I10 Essential (primary) hypertension: Secondary | ICD-10-CM

## 2017-02-08 NOTE — Patient Instructions (Addendum)
Medication Instructions:   Your physician recommends that you continue on your current medications as directed. Please refer to the Current Medication list given to you today.   If you need a refill on your cardiac medications before your next appointment, please call your pharmacy.  Labwork:  NONE ORDERED  TODAY    Testing/Procedures: NONE ORDERED  TODAY    Follow-Up:  IN April WITH DR Katrinka BlazingSMITH    Any Other Special Instructions Will Be Listed Below (If Applicable).

## 2017-06-06 ENCOUNTER — Other Ambulatory Visit: Payer: Self-pay

## 2017-06-06 DIAGNOSIS — I70212 Atherosclerosis of native arteries of extremities with intermittent claudication, left leg: Secondary | ICD-10-CM

## 2017-06-06 DIAGNOSIS — I739 Peripheral vascular disease, unspecified: Secondary | ICD-10-CM

## 2017-06-08 ENCOUNTER — Other Ambulatory Visit: Payer: Self-pay | Admitting: *Deleted

## 2017-06-08 ENCOUNTER — Other Ambulatory Visit: Payer: Self-pay | Admitting: Interventional Cardiology

## 2017-06-08 DIAGNOSIS — I10 Essential (primary) hypertension: Secondary | ICD-10-CM

## 2017-06-08 MED ORDER — SPIRONOLACTONE-HCTZ 25-25 MG PO TABS: 1.0000 | ORAL_TABLET | Freq: Every day | ORAL | 6 refills | Status: DC

## 2017-06-08 NOTE — Telephone Encounter (Signed)
Outpatient Medication Detail    Disp Refills Start End   spironolactone-hydrochlorothiazide (ALDACTAZIDE) 25-25 MG tablet 30 tablet 6 06/08/2017    Sig - Route: Take 1 tablet by mouth daily. - Oral   Sent to pharmacy as: spironolactone-hydrochlorothiazide (ALDACTAZIDE) 25-25 MG tablet   E-Prescribing Status: Receipt confirmed by pharmacy (06/08/2017 11:45 AM EDT)   Associated Diagnoses   Essential hypertension     Pharmacy   [facility] [id number] - [place], [place] - [address]

## 2017-06-09 ENCOUNTER — Encounter: Payer: Self-pay | Admitting: Family

## 2017-06-09 ENCOUNTER — Ambulatory Visit (HOSPITAL_COMMUNITY)
Admission: RE | Admit: 2017-06-09 | Discharge: 2017-06-09 | Disposition: A | Discharge status: Home / Self Care | Payer: BC Managed Care – PPO | Source: Ambulatory Visit | Attending: Family | Admitting: Family

## 2017-06-09 ENCOUNTER — Ambulatory Visit: Payer: BC Managed Care – PPO | Admitting: Family

## 2017-06-09 ENCOUNTER — Other Ambulatory Visit: Payer: Self-pay

## 2017-06-09 VITALS — BP 128/77 | HR 74 | Resp 18 | Ht 65.0 in | Wt 201.0 lb

## 2017-06-09 DIAGNOSIS — Z87891 Personal history of nicotine dependence: Secondary | ICD-10-CM | POA: Diagnosis not present

## 2017-06-09 DIAGNOSIS — E785 Hyperlipidemia, unspecified: Secondary | ICD-10-CM | POA: Insufficient documentation

## 2017-06-09 DIAGNOSIS — I70212 Atherosclerosis of native arteries of extremities with intermittent claudication, left leg: Secondary | ICD-10-CM | POA: Insufficient documentation

## 2017-06-09 DIAGNOSIS — I739 Peripheral vascular disease, unspecified: Secondary | ICD-10-CM | POA: Diagnosis not present

## 2017-06-09 DIAGNOSIS — I10 Essential (primary) hypertension: Secondary | ICD-10-CM | POA: Diagnosis not present

## 2017-06-09 DIAGNOSIS — E1151 Type 2 diabetes mellitus with diabetic peripheral angiopathy without gangrene: Secondary | ICD-10-CM

## 2017-06-09 DIAGNOSIS — I779 Disorder of arteries and arterioles, unspecified: Secondary | ICD-10-CM | POA: Diagnosis not present

## 2017-06-09 NOTE — Patient Instructions (Signed)

## 2017-06-09 NOTE — Progress Notes (Signed)
[facility] [place]   CC: Follow up peripheral artery occlusive disease  History of Present Illness Dawn Wood is a 72 y.o. female whom Dr. Darrick Penna has been monitoring for PAD of her left leg.  Dr. Darrick Penna last evaluated pt in April of 2016. At that time her left leg was essentially asymptomatic. The patient did not feel disabled by this. She was happy with her walking distance. She prefered conservative measures with risk factor modification and a walking program at that point. Dr. Darrick Penna reassured her that her lifetime risk of limb loss if she continues to refrain from smoking and her diabetes is controlled is less than 5%. He also discussed with her the possibility of an intervention to improve her walking distance. She prefered conservative management at that point. Dr. Darrick Penna advised pt to return in 6 months for evaluation.  She occasionally has some pain in her right groin and inner thigh which she thinks is related to her previous cardiac catheterization in 2015. She denies any pain in her left foot. Other medical problems include coronary artery disease with previous stenting, diabetes, hypertension.   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   She had left knee surgery December 2018, is walking better since this.   Pt Diabetic: Yes, states her last A1C was 6.9  Pt smoker: former smoker, quit in 2012, had smoked for 45 years  Pt meds include: Statin :Yes, frequency of dosing was decreased due to myalgias in her hips and thighs Betablocker: Yes ASA: Yes Other anticoagulants/antiplatelets: Plavix    Past Medical History:  Diagnosis Date  . Coronary artery disease   . Diabetes mellitus without complication (HCC)   . GERD (gastroesophageal reflux disease)   . Hypertension   . MI (myocardial infarction) (HCC)   . Peripheral vascular disease [facility])     Social History Social History   Tobacco Use  . Smoking status: Former Games developer  .  Smokeless tobacco: Never Used  Substance Use Topics  . Alcohol use: Yes    Alcohol/week: 0.0 oz    Comment: occ  . Drug use: No    Family History Family History  Problem Relation Age of Onset  . Aortic stenosis Daughter   . Heart disease Daughter        before age 28  . Hypertension Mother   . Cancer Father   . Hypertension Father   . Cancer Sister   . Breast cancer Neg Hx     Past Surgical History:  Procedure Laterality Date  . ABDOMINAL HYSTERECTOMY    . CORONARY STENT PLACEMENT      Allergies  Allergen Reactions  . Dust Mite Extract Other (See Comments)    REACTION: SNEEZING  . Other Other (See Comments)    ALLERGEN: DEODORIZERS SPRAY AND PERFUMES REACTION: SNEEZING  . Pollen Extract Other (See Comments)    Reaction unknown    Current Outpatient Medications  Medication Sig Dispense Refill  . aspirin 81 MG tablet Take 81 mg by mouth daily.    Marland Kitchen atorvastatin (LIPITOR) 20 MG tablet TAKE 1 TABLET BY MOUTH DAILY (Patient taking differently: TAKE 1 TABLET every Monday, Wednessday, and Friday.) 90 tablet 3  . Cholecalciferol (VITAMIN D3) 2000 units TABS Take 1 tablet by mouth daily.    . clopidogrel (PLAVIX) 75 MG tablet TAKE 1 TABLET BY MOUTH EVERY DAY 90 tablet 1  . fluticasone (FLONASE) 50 MCG/ACT nasal spray Place 1 spray into both nostrils daily as  needed for allergies or rhinitis.    Marland Kitchen insulin aspart protamine- aspart (NOVOLOG MIX 70/30) (70-30) 100 UNIT/ML injection Inject 40 Units into the skin 2 (two) times daily with a meal.     . metFORMIN (GLUMETZA) 500 MG (MOD) 24 hr tablet Take one (1) tablet (500 mg) by mouth each morning and two (2) tablets (1,000 mg) by mouth each evening.    . metoprolol succinate (TOPROL-XL) 100 MG 24 hr tablet Take 1 tablet (100 mg total) by mouth daily. Take with or immediately following a meal.    . nitroGLYCERIN (NITROSTAT) 0.4 MG SL tablet Place 1 tablet (0.4 mg total) under the tongue every 5 (five) minutes as needed for chest  pain. 25 tablet 3  . olmesartan (BENICAR) 40 MG tablet Take 40 mg by mouth daily.    Marland Kitchen OZEMPIC 0.25 or 0.5 MG/DOSE SOPN   5  . spironolactone-hydrochlorothiazide (ALDACTAZIDE) 25-25 MG tablet Take 1 tablet by mouth daily. 30 tablet 6  . amLODipine (NORVASC) 5 MG tablet Take 1 tablet (5 mg total) by mouth daily. 90 tablet 3   No current facility-administered medications for this visit.     ROS: See HPI for pertinent positives and negatives.   Physical Examination  Vitals:   06/09/17 1604  BP: 128/77  Pulse: 74  Resp: 18  SpO2: 98%  Weight: 201 lb (91.2 kg)  Height: 5\' 5"  (1.651 m)   Body mass index is 33.45 kg/m.  General: A&O x 3, WDWN obese female. Gait: normal HENT: No gross abnormalities  Eyes: PERRLA. Pulmonary: Respirations are non labored, CTAB, no wheezes, rales, or rhonchi. Cardiac: regular rhythm, no detected murmur.   Carotid Bruits Right Left   Negative Negative   Abdominal aortic pulse is not palpable. Radial pulses: 2+ palpable and =   VASCULAR EXAM: Extremitiesno ischemic changes, no Gangrene; no open wounds. Several thick toenails.   LE Pulses Right Left  FEMORAL 2+ palpable 2++ palpable   POPLITEAL not palpable  not palpable  POSTERIOR TIBIAL 2+ palpable  not palpable   DORSALIS PEDIS ANTERIOR TIBIAL 1+ palpable  not palpable    Abdomen: soft, NT, no palpable masses. Musculoskeletal: no muscle wasting or atrophy. Skin: no rashes, no cellulitis, no ulcers noted. Neurologic: A&O X 3; appropriate affect, Sensation is normal; MOTOR FUNCTION:  moving all extremities equally, motor strength 5/5 throughout. Speech is fluent/normal. CN 2-12 intact. Psychiatric: Thought content is normal, mood appropriate for clinical situation.    ASSESSMENT: Dawn Wood is a 72 y.o. female who has a history of mild claudication in her left calf, but this seems to have  resolved since she had left knee surgery, no claudication symptoms in her right leg, no signs of ischemia in her feet legs.  She quit smoking in 2012, had smoked for 45 years. She has DM which is currently in control.    DATA  ABI (Date: 06/09/2017):  R:   ABI: 1.02 (was 1.02 on 06-03-16),   PT: bi  DP: bi  TBI:  0.79 (was 0.81)  L:   ABI: 0.74 (was 0.64),   PT: mono  DP: mono  TBI: 0.46 (was 0.52)  Stable and normal right ABI and TBI, biphasic waveforms.  Improved left ABI with monophasic waveforms, moderate disease.   PLAN:  Graduated walking program discussed and how to achieve. Based on the patient's vascular studies and examination, pt will return to clinic in 1 year with ABI's. I advised her to notify us if she develops  concerns re the circulation in her feet/legs    I discussed in depth with the patient the nature of atherosclerosis, and emphasized the importance of maximal medical management including strict control of blood pressure, blood glucose, and lipid levels, obtaining regular exercise, and continued cessation of smoking.  The patient is aware that without maximal medical management the underlying atherosclerotic disease process will progress, limiting the benefit of any interventions.  The patient was given information about PAD including signs, symptoms, treatment, what symptoms should prompt the patient to seek immediate medical care, and risk reduction measures to take.  Charisse MarchSuzanne Nickel, RN, MSN, FNP-C [facility] [place] Office Phone: [phone]  Clinic MD: [facility]  06/09/17 4:47 PM

## 2017-06-13 ENCOUNTER — Other Ambulatory Visit: Payer: Self-pay | Admitting: Interventional Cardiology

## 2017-07-07 ENCOUNTER — Other Ambulatory Visit: Payer: Self-pay | Admitting: Interventional Cardiology

## 2017-09-14 ENCOUNTER — Ambulatory Visit: Payer: BC Managed Care – PPO | Admitting: Interventional Cardiology

## 2017-09-14 ENCOUNTER — Encounter: Payer: Self-pay | Admitting: Interventional Cardiology

## 2017-09-14 VITALS — BP 134/66 | HR 80 | Ht 65.0 in | Wt 197.8 lb

## 2017-09-14 DIAGNOSIS — E785 Hyperlipidemia, unspecified: Secondary | ICD-10-CM

## 2017-09-14 DIAGNOSIS — M109 Gout, unspecified: Secondary | ICD-10-CM | POA: Diagnosis not present

## 2017-09-14 DIAGNOSIS — I10 Essential (primary) hypertension: Secondary | ICD-10-CM

## 2017-09-14 DIAGNOSIS — I739 Peripheral vascular disease, unspecified: Secondary | ICD-10-CM | POA: Diagnosis not present

## 2017-09-14 DIAGNOSIS — I2510 Atherosclerotic heart disease of native coronary artery without angina pectoris: Secondary | ICD-10-CM | POA: Diagnosis not present

## 2017-09-14 MED ORDER — COLCHICINE 0.6 MG PO TABS: 0.6000 mg | ORAL_TABLET | Freq: Two times a day (BID) | ORAL | 0 refills | Status: DC

## 2017-09-14 NOTE — Progress Notes (Signed)
Cardiology Office Note    Date:  09/14/2017   ID:  [name], DOB 1945-03-27, MRN [id number]  PCP:  [name], [name], MD  Cardiologist: Lesleigh NoeHenry W Lasonia Casino III, MD   Chief Complaint  Patient presents with  . Coronary Artery Disease    History of Present Illness:  Dawn Wood is a 72 y.o. female who presents for CAD With prior LAD DE stent 2015 Dr. Park BreedKahn, hypertension, and hyperlipidemia.  Recently cleared for left knee arthroscopy which occurred off antiplatelet therapy without complications.  Left great toe tenderness and pain starting within the last 24 to 48 hours.  No chills or fever.  Over the past year she has had 2 or 3 episodes of a burning discomfort in the chest.  It is relieved with Tums.  The discomfort is not exertion related.  There is no associated shortness of breath.   Past Medical History:  Diagnosis Date  . Coronary artery disease   . Diabetes mellitus without complication (HCC)   . GERD (gastroesophageal reflux disease)   . Hypertension   . MI (myocardial infarction) (HCC)   . Peripheral vascular disease [facility](HCC)     Past Surgical History:  Procedure Laterality Date  . ABDOMINAL HYSTERECTOMY    . CORONARY STENT PLACEMENT      Current Medications: Outpatient Medications Prior to Visit  Medication Sig Dispense Refill  . amLODipine (NORVASC) 5 MG tablet Take 1 tablet (5 mg total) by mouth daily. 90 tablet 1  . aspirin 81 MG tablet Take 81 mg by mouth daily.    Marland Kitchen. atorvastatin (LIPITOR) 20 MG tablet TAKE 1 TABLET BY MOUTH DAILY (Patient taking differently: TAKE 1 TABLET every Monday, Wednessday, and Friday.) 90 tablet 3  . Cholecalciferol (VITAMIN D3) 2000 units TABS Take 1 tablet by mouth daily.    . clopidogrel (PLAVIX) 75 MG tablet Take 1 tablet (75 mg total) by mouth daily. 90 tablet 2  . fluticasone (FLONASE) 50 MCG/ACT nasal spray Place 1 spray into both nostrils daily as needed for allergies or rhinitis.    Marland Kitchen. insulin aspart protamine- aspart  (NOVOLOG MIX 70/30) (70-30) 100 UNIT/ML injection Inject 40 Units into the skin 2 (two) times daily with a meal.     . metFORMIN (GLUMETZA) 500 MG (MOD) 24 hr tablet Take one (1) tablet (500 mg) by mouth each morning and two (2) tablets (1,000 mg) by mouth each evening.    . metoprolol succinate (TOPROL-XL) 100 MG 24 hr tablet Take 1 tablet (100 mg total) by mouth daily. Take with or immediately following a meal.    . nitroGLYCERIN (NITROSTAT) 0.4 MG SL tablet Place 1 tablet (0.4 mg total) under the tongue every 5 (five) minutes as needed for chest pain. 25 tablet 3  . olmesartan (BENICAR) 40 MG tablet Take 40 mg by mouth daily.    Marland Kitchen. OZEMPIC 0.25 or 0.5 MG/DOSE SOPN   5  . spironolactone-hydrochlorothiazide (ALDACTAZIDE) 25-25 MG tablet Take 1 tablet by mouth daily. 30 tablet 6   No facility-administered medications prior to visit.      Allergies:   Dust mite extract; Other; and Pollen extract   Social History   Socioeconomic History  . Marital status: Single    Spouse name: Not on file  . Number of children: Not on file  . Years of education: Not on file  . Highest education level: Not on file  Occupational History  . Not on file  Social Needs  . Financial resource strain:  Not on file  . Food insecurity:    Worry: Not on file    Inability: Not on file  . Transportation needs:    Medical: Not on file    Non-medical: Not on file  Tobacco Use  . Smoking status: Former Games developer  . Smokeless tobacco: Never Used  Substance and Sexual Activity  . Alcohol use: Yes    Alcohol/week: 0.0 oz    Comment: occ  . Drug use: No  . Sexual activity: Not on file  Lifestyle  . Physical activity:    Days per week: Not on file    Minutes per session: Not on file  . Stress: Not on file  Relationships  . Social connections:    Talks on phone: Not on file    Gets together: Not on file    Attends religious service: Not on file    Active member of club or organization: Not on file    Attends  meetings of clubs or organizations: Not on file    Relationship status: Not on file  Other Topics Concern  . Not on file  Social History Narrative  . Not on file     Family History:  The patient's family history includes Aortic stenosis in her daughter; Cancer in her father and sister; Heart disease in her daughter; Hypertension in her father and mother.   ROS:   Please see the history of present illness.    No history of gout.  Occasional vision disturbance.  Bilateral ankle edema intermittently.  Not associated with shortness of breath.  Occasional constipation.  Difficulty urinating.  Difficulty with joint swelling.  No history of gout. All other systems reviewed and are negative.   PHYSICAL EXAM:   VS:  BP 134/66   Pulse 80   Ht 5\' 5"  (1.651 m)   Wt 197 lb 12.8 oz (89.7 kg)   BMI 32.92 kg/m    GEN: Well nourished, well developed, in no acute distress  HEENT: normal  Neck: no JVD, carotid bruits, or masses Cardiac: RRR; no murmurs, rubs, or gallops,no edema  Respiratory:  clear to auscultation bilaterally, normal work of breathing GI: soft, nontender, nondistended, + BS MS: Great toe gout/podagra.  Warm and very tender to touch. Skin: warm and dry, no rash Neuro:  Alert and Oriented x 3, Strength and sensation are intact Psych: euthymic mood, full affect  Wt Readings from Last 3 Encounters:  09/14/17 197 lb 12.8 oz (89.7 kg)  06/09/17 201 lb (91.2 kg)  02/08/17 197 lb 1.9 oz (89.4 kg)      Studies/Labs Reviewed:   EKG:  EKG  Not repeated.  Recent Labs: No results found for requested labs within last 8760 hours.   Lipid Panel    Component Value Date/Time   CHOL 132 12/02/2014 0856   CHOL 167 05/28/2013 0413   TRIG 110.0 12/02/2014 0856   TRIG 180 05/28/2013 0413   HDL 38.90 (L) 12/02/2014 0856   HDL 37 (L) 05/28/2013 0413   CHOLHDL 3 12/02/2014 0856   VLDL 22.0 12/02/2014 0856   VLDL 36 05/28/2013 0413   LDLCALC 71 12/02/2014 0856   LDLCALC 94 05/28/2013  0413    Additional studies/ records that were reviewed today include:  none    ASSESSMENT:    1. CAD in native artery   2. Dyslipidemia   3. PVD (peripheral vascular disease) (HCC)   4. Essential hypertension   5. Podagra      PLAN:  In order  of problems listed above:  1. No angina or NTG use. "Indigestion" is likely GI as she thinks. 2. LDL is 73 mg/dl. This is at target. 3. Not discussed. 4. Controlled at <130/60 mmHg. Likely gout. Started Colchicine .6 mg BID. F/U with PCP.  Clinical f/u in 1 year.  Medication Adjustments/Labs and Tests Ordered: Current medicines are reviewed at length with the patient today.  Concerns regarding medicines are outlined above.  Medication changes, Labs and Tests ordered today are listed in the Patient Instructions below. Patient Instructions  Medication Instructions:  1. START COLCHICINE 0.6 MG TWICE DAILY; RX HAS BEEN SENT IN # 14. FURTHER REFILLS WILL NEED TO BE FILLED BY PRIMARY CARE;  Labwork: NONE ORDERED TODAHY  Testing/Procedures: NONE ORDERED TODAY  Follow-Up: 1 YEAR FOLLOW UP WITH DR. Katrinka Blazing  YOU HAVE BEEN ADVISED TO FOLLOW UP WITH PRIMARY CARE  Any Other Special Instructions Will Be Listed Below (If Applicable).  PER DR. Katrinka Blazing TRY TO GET MORE AEROBIC EXERCISE IN    If you need a refill on your cardiac medications before your next appointment, please call your pharmacy.      Signed, Lesleigh Noe, MD  09/14/2017 6:24 PM    [facility] [address] [place], [place], [place]  [address] Phone: [phone]; Fax: [phone]

## 2017-09-14 NOTE — Patient Instructions (Signed)
Medication Instructions:  1. START COLCHICINE 0.6 MG TWICE DAILY; RX HAS BEEN SENT IN # 14. FURTHER REFILLS WILL NEED TO BE FILLED BY PRIMARY CARE;  Labwork: NONE ORDERED TODAHY  Testing/Procedures: NONE ORDERED TODAY  Follow-Up: 1 YEAR FOLLOW UP WITH DR. Katrinka BlazingSMITH  YOU HAVE BEEN ADVISED TO FOLLOW UP WITH PRIMARY CARE  Any Other Special Instructions Will Be Listed Below (If Applicable).  PER DR. Katrinka BlazingSMITH TRY TO GET MORE AEROBIC EXERCISE IN    If you need a refill on your cardiac medications before your next appointment, please call your pharmacy.

## 2017-10-03 LAB — BASIC METABOLIC PANEL
BUN: 19 (ref 4–21)
Creatinine: 1 (ref 0.5–1.1)
Glucose: 101

## 2017-10-03 LAB — LIPID PANEL
Cholesterol: 157 (ref 0–200)
HDL: 42 (ref 35–70)
LDL Cholesterol: 90
LDl/HDL Ratio: 2.1
Triglycerides: 124 (ref 40–160)

## 2017-10-03 LAB — HEMOGLOBIN A1C: Hemoglobin A1C: 6.5

## 2017-10-03 LAB — HEPATIC FUNCTION PANEL
ALT: 17 (ref 7–35)
AST: 16 (ref 13–35)
Alkaline Phosphatase: 78 (ref 25–125)

## 2017-10-13 ENCOUNTER — Other Ambulatory Visit: Payer: Self-pay | Admitting: Internal Medicine

## 2017-10-13 DIAGNOSIS — Z1231 Encounter for screening mammogram for malignant neoplasm of breast: Secondary | ICD-10-CM

## 2017-11-09 ENCOUNTER — Ambulatory Visit
Admission: RE | Admit: 2017-11-09 | Discharge: 2017-11-09 | Disposition: A | Discharge status: Home / Self Care | Payer: BC Managed Care – PPO | Source: Ambulatory Visit | Attending: Internal Medicine | Admitting: Internal Medicine

## 2017-11-09 DIAGNOSIS — Z1231 Encounter for screening mammogram for malignant neoplasm of breast: Secondary | ICD-10-CM

## 2017-11-11 ENCOUNTER — Other Ambulatory Visit: Payer: Self-pay | Admitting: Internal Medicine

## 2017-11-11 DIAGNOSIS — R928 Other abnormal and inconclusive findings on diagnostic imaging of breast: Secondary | ICD-10-CM

## 2017-11-15 ENCOUNTER — Ambulatory Visit
Admission: RE | Admit: 2017-11-15 | Discharge: 2017-11-15 | Disposition: A | Discharge status: Home / Self Care | Payer: BC Managed Care – PPO | Source: Ambulatory Visit | Attending: Internal Medicine | Admitting: Internal Medicine

## 2017-11-15 ENCOUNTER — Ambulatory Visit: Admission: RE | Admit: 2017-11-15 | Payer: BC Managed Care – PPO | Source: Ambulatory Visit

## 2017-11-15 DIAGNOSIS — R928 Other abnormal and inconclusive findings on diagnostic imaging of breast: Secondary | ICD-10-CM

## 2017-12-19 ENCOUNTER — Other Ambulatory Visit: Payer: Self-pay | Admitting: Internal Medicine

## 2017-12-26 ENCOUNTER — Encounter: Payer: Self-pay | Admitting: Internal Medicine

## 2017-12-26 DIAGNOSIS — M109 Gout, unspecified: Secondary | ICD-10-CM | POA: Insufficient documentation

## 2017-12-29 ENCOUNTER — Encounter: Payer: Self-pay | Admitting: Internal Medicine

## 2017-12-29 ENCOUNTER — Ambulatory Visit: Payer: BC Managed Care – PPO | Admitting: Internal Medicine

## 2017-12-29 VITALS — BP 128/80 | HR 70 | Temp 98.2°F | Ht 63.25 in | Wt 200.6 lb

## 2017-12-29 DIAGNOSIS — Z0000 Encounter for general adult medical examination without abnormal findings: Secondary | ICD-10-CM

## 2017-12-29 DIAGNOSIS — E1165 Type 2 diabetes mellitus with hyperglycemia: Secondary | ICD-10-CM | POA: Diagnosis not present

## 2017-12-29 DIAGNOSIS — I739 Peripheral vascular disease, unspecified: Secondary | ICD-10-CM

## 2017-12-29 DIAGNOSIS — I119 Hypertensive heart disease without heart failure: Secondary | ICD-10-CM

## 2017-12-29 DIAGNOSIS — Z7982 Long term (current) use of aspirin: Secondary | ICD-10-CM

## 2017-12-29 DIAGNOSIS — I2510 Atherosclerotic heart disease of native coronary artery without angina pectoris: Secondary | ICD-10-CM | POA: Diagnosis not present

## 2017-12-29 LAB — POCT URINALYSIS DIPSTICK
Bilirubin, UA: NEGATIVE
Blood, UA: NEGATIVE
Glucose, UA: NEGATIVE
Ketones, UA: NEGATIVE
Leukocytes, UA: NEGATIVE
Nitrite, UA: NEGATIVE
Protein, UA: NEGATIVE
Spec Grav, UA: 1.02 (ref 1.010–1.025)
Urobilinogen, UA: 0.2 E.U./dL
pH, UA: 6 (ref 5.0–8.0)

## 2017-12-29 LAB — POCT UA - MICROALBUMIN
Albumin/Creatinine Ratio, Urine, POC: <30
Creatinine, POC: 200 mg/dL
Microalbumin Ur, POC: 30 mg/L

## 2017-12-29 NOTE — Progress Notes (Signed)
Subjective:     Patient ID: Dawn Wood , female    DOB: [birth year] , 72 y.o.   MRN: [id number]     No LMP recorded. Patient has had a hysterectomy..  Negative for: breast discharge, breast lump(s), breast pain and breast self exam. Associated symptoms include abnormal vaginal bleeding. Pertinent negatives include abnormal bleeding (hematology), anxiety, decreased libido, depression, difficulty falling sleep, dyspareunia, history of infertility, nocturia, sexual dysfunction, sleep disturbances, urinary incontinence, urinary urgency, vaginal discharge and vaginal itching. Diet regular.The patient states her exercise level is  MINIMAL. STATES SHE DOES A LOT OF WALKING AT WORK.   . The patient's tobacco use is:  Social History   Tobacco Use  Smoking Status Former Smoker  Smokeless Tobacco Never Used  . She has been exposed to passive smoke. The patient's alcohol use is:  Social History   Substance and Sexual Activity  Alcohol Use Yes  . Alcohol/week: 0.0 standard drinks   Comment: occ    SHE IS HERE TODAY FOR A FULL PHYSICAL EXAMINATION. SHE HAS NO SPECIFIC CONCERNS AT THIS TIME.   Diabetes  She presents for her follow-up diabetic visit. She has type 2 diabetes mellitus. Her disease course has been stable. There are no hypoglycemic associated symptoms. There are no diabetic associated symptoms. There are no hypoglycemic complications. Symptoms are stable. Diabetic complications include heart disease and PVD. Risk factors for coronary artery disease include hypertension, dyslipidemia, diabetes mellitus, post-menopausal and sedentary lifestyle.  Hypertension  This is a chronic problem. The current episode started more than 1 year ago. The problem has been gradually improving since onset. The problem is controlled. Hypertensive end-organ damage includes PVD.  SHE REPORTS COMPLIANCE WITH MEDS.    Past Medical History:  Diagnosis Date  . Coronary artery disease   . Diabetes mellitus  without complication ([place])   . GERD (gastroesophageal reflux disease)   . Hypertension   . MI (myocardial infarction) ([place])   . Peripheral vascular disease ([place])       Current Outpatient Medications:  .  amLODipine (NORVASC) 5 MG tablet, Take 1 tablet (5 mg total) by mouth daily., Disp: 90 tablet, Rfl: 1 .  aspirin 81 MG tablet, Take 81 mg by mouth daily., Disp: , Rfl:  .  atorvastatin (LIPITOR) 20 MG tablet, TAKE 1 TABLET BY MOUTH DAILY (Patient taking differently: TAKE 1 TABLET every Monday, Wednessday, and Friday.), Disp: 90 tablet, Rfl: 3 .  Cholecalciferol (VITAMIN D3) 2000 units TABS, Take 1 tablet by mouth daily., Disp: , Rfl:  .  clopidogrel (PLAVIX) 75 MG tablet, Take 1 tablet (75 mg total) by mouth daily., Disp: 90 tablet, Rfl: 2 .  colchicine 0.6 MG tablet, Take 1 tablet (0.6 mg total) by mouth 2 (two) times daily., Disp: 14 tablet, Rfl: 0 .  fluticasone (FLONASE) 50 MCG/ACT nasal spray, Place 1 spray into both nostrils daily as needed for allergies or rhinitis., Disp: , Rfl:  .  insulin aspart protamine- aspart (NOVOLOG MIX 70/30) (70-30) 100 UNIT/ML injection, Inject 40 Units into the skin 2 (two) times daily with a meal. , Disp: , Rfl:  .  metoprolol succinate (TOPROL-XL) 100 MG 24 hr tablet, Take 1 tablet (100 mg total) by mouth daily. Take with or immediately following a meal., Disp: , Rfl:  .  nitroGLYCERIN (NITROSTAT) 0.4 MG SL tablet, Place 1 tablet (0.4 mg total) under the tongue every 5 (five) minutes as needed for chest pain., Disp: 25 tablet, Rfl: 3 .  olmesartan (BENICAR) 40  MG tablet, Take 40 mg by mouth daily., Disp: , Rfl:  .  OZEMPIC, 0.25 OR 0.5 MG/DOSE, 2 MG/1.5ML SOPN, INJECT 0.5MG SUBCUTANEOUSLY ONCE WEEKLY IN THE ABDOMEN, THIGHS, OR UPPER ARM ROTATING INJECTION SITES, Disp: 1 pen, Rfl: 1 .  spironolactone-hydrochlorothiazide (ALDACTAZIDE) 25-25 MG tablet, Take 1 tablet by mouth daily., Disp: 30 tablet, Rfl: 6   Allergies  Allergen Reactions  . Dust Mite  Extract Other (See Comments)    REACTION: SNEEZING  . Other Other (See Comments)    ALLERGEN: DEODORIZERS SPRAY AND PERFUMES REACTION: SNEEZING  . Pollen Extract Other (See Comments)    Reaction unknown     Review of Systems  Constitutional: Negative.   HENT: Negative.   Eyes: Negative.   Respiratory: Negative.   Cardiovascular: Negative.   Gastrointestinal: Negative.   Endocrine: Negative.   Genitourinary: Negative.   Musculoskeletal: Negative.   Skin: Negative.   Allergic/Immunologic: Negative.   Neurological: Negative.   Hematological: Negative.   Psychiatric/Behavioral: Negative.      Today's Vitals   12/29/17 1021  BP: 128/80  Pulse: 70  Temp: 98.2 F (36.8 C)  TempSrc: Oral  Weight: 200 lb 9.6 oz (91 kg)  Height: 5' 3.25" (1.607 m)   Body mass index is 35.25 kg/m.   Objective:  Physical Exam  Constitutional: She appears well-developed and well-nourished.  HENT:  Head: Normocephalic and atraumatic.  Right Ear: External ear normal.  Left Ear: External ear normal.  Nose: Nose normal.  Mouth/Throat: Oropharynx is clear and moist.  FAIR DENTITION  Eyes: Pupils are equal, round, and reactive to light. Conjunctivae and EOM are normal.  Neck: Normal range of motion. Neck supple.  Cardiovascular: Normal rate, regular rhythm and normal heart sounds.  Pulmonary/Chest: Effort normal and breath sounds normal. Right breast exhibits no inverted nipple, no mass, no nipple discharge, no skin change and no tenderness. Left breast exhibits no inverted nipple, no mass, no nipple discharge, no skin change and no tenderness.  Abdominal: Soft. Bowel sounds are normal.  Genitourinary:  Genitourinary Comments: DEFERRED  Musculoskeletal: Normal range of motion.  Feet:  Right Foot:  Protective Sensation: 5 sites tested. 5 sites sensed.  Skin Integrity: Positive for dry skin.  Left Foot:  Protective Sensation: 5 sites tested. 5 sites sensed.  Skin Integrity: Positive for dry  skin.  Skin: Skin is warm and dry.  Psychiatric: She has a normal mood and affect.  Nursing note and vitals reviewed.       Assessment And Plan:     1. Routine general medical examination at health care facility  A FULL EXAM WAS PERFORMED.  IMPORTANCE OF MONTHLY SELF BREAST EXAMS WAS DISCUSSED WITH THE PATIENT. PATIENT HAS BEEN ADVISED TO GET 30-45 MINUTES REGULAR EXERCISE NO LESS THAN FOUR TO FIVE DAYS PER WEEK - BOTH WEIGHTBEARING EXERCISES AND AEROBIC ARE RECOMMENDED.  SHE IS ADVISED TO FOLLOW A HEALTHY DIET WITH AT LEAST SIX FRUITS/VEGGIES PER DAY, DECREASE INTAKE OF RED MEAT, AND TO INCREASE FISH INTAKE TO TWO DAYS PER WEEK.  MEATS/FISH SHOULD NOT BE FRIED---BAKED OR BROILED IS PREFERABLE.  I SUGGEST WEARING SPF 50 SUNSCREEN ON EXPOSED PARTS AND ESPECIALLY WHEN IN THE DIRECT SUNLIGHT FOR AN EXTENDED PERIOD OF TIME.  PLEASE AVOID FAST FOOD RESTAURANTS AND INCREASE YOUR WATER INTAKE.  - CBC no Diff - CMP14+EGFR - Hemoglobin A1c - Lipid Profile  2. Uncontrolled type 2 diabetes mellitus with hyperglycemia (HCC)  DIABETIC FOOT EXAM WAS PERFORMED.  I DISCUSSED WITH THE PATIENT AT LENGTH  REGARDING THE GOALS OF GLYCEMIC CONTROL AND POSSIBLE LONG-TERM COMPLICATIONS.  I  ALSO STRESSED THE IMPORTANCE OF COMPLIANCE WITH HOME GLUCOSE MONITORING, DIETARY RESTRICTIONS INCLUDING AVOIDANCE OF SUGARY DRINKS/PROCESSED FOODS,  ALONG WITH REGULAR EXERCISE.  I  ALSO STRESSED THE IMPORTANCE OF ANNUAL EYE EXAMS, SELF FOOT CARE AND COMPLIANCE WITH OFFICE VISITS.  3. Benign hypertensive heart disease without heart failure  CONTROLLED. SHE WILL CONTINUE WITH CURRENT MEDS. SHE IS ENCOURAGED TO LIMIT HER SALT INTAKE. SHE DECLINED EKG TODAY.   4. CAD in native artery  CHRONIC, YET STABLE. SHE IS ALSO FOLLOWED BY CARDIOLOGY. IMPORTANCE OF DIETARY, EXERCISE AND MEDICATION COMPLIANCE WAS DISCUSSED WITH THE PATIENT.   5. PVD (peripheral vascular disease) (HCC)  CHRONIC, YET STABLE. IMPORTANCE OF STATIN COMPLIANCE  WAS AGAIN DISCUSSED WITH THE PATIENT.        [name], MD

## 2017-12-30 LAB — CMP14+EGFR
ALT: 24 IU/L (ref 0–32)
AST: 19 IU/L (ref 0–40)
Albumin/Globulin Ratio: 1.5 (ref 1.2–2.2)
Albumin: 4.2 g/dL (ref 3.5–4.8)
Alkaline Phosphatase: 81 IU/L (ref 39–117)
BUN/Creatinine Ratio: 13 (ref 12–28)
BUN: 14 mg/dL (ref 8–27)
Bilirubin Total: 0.6 mg/dL (ref 0.0–1.2)
CO2: 25 mmol/L (ref 20–29)
Calcium: 9.6 mg/dL (ref 8.7–10.3)
Chloride: 100 mmol/L (ref 96–106)
Creatinine, Ser: 1.05 mg/dL — ABNORMAL HIGH (ref 0.57–1.00)
GFR calc Af Amer: 61 mL/min/{1.73_m2} (ref >59)
GFR calc non Af Amer: 53 mL/min/{1.73_m2} — ABNORMAL LOW (ref >59)
Globulin, Total: 2.8 g/dL (ref 1.5–4.5)
Glucose: 73 mg/dL (ref 65–99)
Potassium: 4.2 mmol/L (ref 3.5–5.2)
Sodium: 142 mmol/L (ref 134–144)
Total Protein: 7 g/dL (ref 6.0–8.5)

## 2017-12-30 LAB — HEMOGLOBIN A1C
Est. average glucose Bld gHb Est-mCnc: 134 mg/dL
Hgb A1c MFr Bld: 6.3 % — ABNORMAL HIGH (ref 4.8–5.6)

## 2017-12-30 LAB — LIPID PANEL
Chol/HDL Ratio: 3.2 ratio (ref 0.0–4.4)
Cholesterol, Total: 145 mg/dL (ref 100–199)
HDL: 46 mg/dL (ref >39)
LDL Calculated: 83 mg/dL (ref 0–99)
Triglycerides: 80 mg/dL (ref 0–149)
VLDL Cholesterol Cal: 16 mg/dL (ref 5–40)

## 2017-12-30 LAB — CBC
Hematocrit: 43.3 % (ref 34.0–46.6)
Hemoglobin: 14 g/dL (ref 11.1–15.9)
MCH: 27.3 pg (ref 26.6–33.0)
MCHC: 32.3 g/dL (ref 31.5–35.7)
MCV: 85 fL (ref 79–97)
Platelets: 290 10*3/uL (ref 150–450)
RBC: 5.12 x10E6/uL (ref 3.77–5.28)
RDW: 13 % (ref 12.3–15.4)
WBC: 8.6 10*3/uL (ref 3.4–10.8)

## 2018-01-01 ENCOUNTER — Encounter: Payer: Self-pay | Admitting: Internal Medicine

## 2018-01-01 NOTE — Progress Notes (Signed)
Here are your lab results:  Your blood count is normal.  Your liver and kidney function are stable. Be sure to stay well hydrated.  Your a1c is great at 6.3.  Keep up the great work! Your cholesterol is pretty good. Your LDL, bad cholesterol, is 83. As a diabetic, it should be less than 70. I suggest you increase your daily activity as tolerated.   Sincerely,    Charley Lafrance N. Allyne Gee, MD

## 2018-01-04 ENCOUNTER — Other Ambulatory Visit: Payer: Self-pay | Admitting: Interventional Cardiology

## 2018-01-04 DIAGNOSIS — I10 Essential (primary) hypertension: Secondary | ICD-10-CM

## 2018-01-16 NOTE — Addendum Note (Signed)
Addended by: [name] on: 01/16/2018 04:59 PM   Modules accepted: Orders

## 2018-02-12 ENCOUNTER — Other Ambulatory Visit: Payer: Self-pay | Admitting: Internal Medicine

## 2018-02-13 ENCOUNTER — Other Ambulatory Visit: Payer: Self-pay | Admitting: Internal Medicine

## 2018-02-15 ENCOUNTER — Other Ambulatory Visit: Payer: Self-pay | Admitting: Internal Medicine

## 2018-02-20 HISTORY — PX: CATARACT EXTRACTION: SUR2

## 2018-03-09 ENCOUNTER — Other Ambulatory Visit: Payer: Self-pay | Admitting: Interventional Cardiology

## 2018-03-10 ENCOUNTER — Other Ambulatory Visit: Payer: Self-pay | Admitting: Interventional Cardiology

## 2018-04-04 ENCOUNTER — Ambulatory Visit: Payer: BC Managed Care – PPO | Admitting: Internal Medicine

## 2018-04-04 ENCOUNTER — Encounter: Payer: Self-pay | Admitting: Internal Medicine

## 2018-04-04 ENCOUNTER — Other Ambulatory Visit: Payer: Self-pay

## 2018-04-04 VITALS — BP 144/88 | HR 74 | Temp 98.5°F | Ht 63.4 in | Wt 202.2 lb

## 2018-04-04 DIAGNOSIS — I739 Peripheral vascular disease, unspecified: Secondary | ICD-10-CM

## 2018-04-04 DIAGNOSIS — E1122 Type 2 diabetes mellitus with diabetic chronic kidney disease: Secondary | ICD-10-CM | POA: Diagnosis not present

## 2018-04-04 DIAGNOSIS — Z86010 Personal history of colonic polyps: Secondary | ICD-10-CM

## 2018-04-04 DIAGNOSIS — Z794 Long term (current) use of insulin: Secondary | ICD-10-CM

## 2018-04-04 DIAGNOSIS — R1310 Dysphagia, unspecified: Secondary | ICD-10-CM

## 2018-04-04 DIAGNOSIS — I1310 Hypertensive heart and chronic kidney disease without heart failure, with stage 1 through stage 4 chronic kidney disease, or unspecified chronic kidney disease: Secondary | ICD-10-CM | POA: Diagnosis not present

## 2018-04-04 DIAGNOSIS — Z7982 Long term (current) use of aspirin: Secondary | ICD-10-CM

## 2018-04-04 DIAGNOSIS — N182 Chronic kidney disease, stage 2 (mild): Secondary | ICD-10-CM

## 2018-04-04 MED ORDER — FLUTICASONE PROPIONATE 50 MCG/ACT NA SUSP: 1.0000 | Freq: Every day | NASAL | 2 refills | Status: DC | PRN

## 2018-04-04 NOTE — Progress Notes (Signed)
Subjective:     Patient ID: Dawn Wood , female    DOB: [birth year] , 73 y.o.   MRN: [id number]   Chief Complaint  Patient presents with  . Diabetes  . Hypertension    HPI  Diabetes  She presents for her follow-up diabetic visit. She has type 2 diabetes mellitus. Her disease course has been stable. There are no hypoglycemic associated symptoms. Pertinent negatives for diabetes include no blurred vision and no chest pain. There are no hypoglycemic complications. Diabetic complications include nephropathy. Risk factors for coronary artery disease include diabetes mellitus, dyslipidemia, hypertension, obesity, sedentary lifestyle and post-menopausal. Current diabetic treatment includes insulin injections. She is following a diabetic diet. She participates in exercise intermittently. Her home blood glucose trend is fluctuating minimally. Her breakfast blood glucose is taken between 8-9 am. Her breakfast blood glucose range is generally 110-130 mg/dl. Eye exam is current.  Hypertension  This is a chronic problem. The current episode started more than 1 year ago. The problem has been gradually improving since onset. Pertinent negatives include no blurred vision, chest pain, palpitations or shortness of breath.  She reports compliance with meds.   Past Medical History:  Diagnosis Date  . Coronary artery disease   . Diabetes mellitus without complication (HCC)   . GERD (gastroesophageal reflux disease)   . Hypertension   . MI (myocardial infarction) (HCC)   . Peripheral vascular disease (HCC)      Family History  Problem Relation Age of Onset  . Aortic stenosis Daughter   . Heart disease Daughter        before age 102  . Hypertension Mother   . Cancer Father   . Hypertension Father   . Cancer Sister   . Breast cancer Neg Hx      Current Outpatient Medications:  .  amLODipine (NORVASC) 5 MG tablet, TAKE 1 TABLET(5 MG) BY MOUTH DAILY, Disp: 90 tablet, Rfl: 2 .  aspirin 81 MG  tablet, Take 81 mg by mouth daily., Disp: , Rfl:  .  atorvastatin (LIPITOR) 20 MG tablet, TAKE 1 TABLET BY MOUTH DAILY (Patient taking differently: 3 (three) times a week. ), Disp: 90 tablet, Rfl: 1 .  Cholecalciferol (VITAMIN D3) 2000 units TABS, Take 1 tablet by mouth daily., Disp: , Rfl:  .  clopidogrel (PLAVIX) 75 MG tablet, TAKE 1 TABLET(75 MG) BY MOUTH DAILY, Disp: 90 tablet, Rfl: 1 .  colchicine 0.6 MG tablet, Take 1 tablet (0.6 mg total) by mouth 2 (two) times daily., Disp: 14 tablet, Rfl: 0 .  fluticasone (FLONASE) 50 MCG/ACT nasal spray, Place 1 spray into both nostrils daily as needed for allergies or rhinitis., Disp: 16 g, Rfl: 2 .  insulin aspart protamine- aspart (NOVOLOG MIX 70/30) (70-30) 100 UNIT/ML injection, Inject 40 Units into the skin 2 (two) times daily with a meal. , Disp: , Rfl:  .  metoprolol succinate (TOPROL-XL) 100 MG 24 hr tablet, TAKE 1 TABLET BY MOUTH EVERY DAY, Disp: 30 tablet, Rfl: 4 .  NOVOLOG MIX 70/30 FLEXPEN (70-30) 100 UNIT/ML FlexPen, INJECT 40 UNITS IN THE SKIN IN THE MORNING AND 45 UNITS IN THE EVENING AS DIRECTED, Disp: 25 pen, Rfl: 2 .  olmesartan (BENICAR) 40 MG tablet, Take 40 mg by mouth daily., Disp: , Rfl:  .  OZEMPIC, 0.25 OR 0.5 MG/DOSE, 2 MG/1.5ML SOPN, INJECT 0.5MG  SUBCUTANEOUS ONCE WEEKLY IN THE ABDOMEN,THIGHS, OR UPPER ARM ROTATING INJECTION SITES, Disp: 3 pen, Rfl: 1 .  spironolactone-hydrochlorothiazide (ALDACTAZIDE) 25-25 MG tablet,  TAKE 1 TABLET BY MOUTH DAILY, Disp: 30 tablet, Rfl: 8 .  nitroGLYCERIN (NITROSTAT) 0.4 MG SL tablet, Place 1 tablet (0.4 mg total) under the tongue every 5 (five) minutes as needed for chest pain., Disp: 25 tablet, Rfl: 3   Allergies  Allergen Reactions  . Dust Mite Extract Other (See Comments)    REACTION: SNEEZING  . Other Other (See Comments)    ALLERGEN: DEODORIZERS SPRAY AND PERFUMES REACTION: SNEEZING  . Pollen Extract Other (See Comments)    Reaction unknown     Review of Systems  Constitutional:  Negative.   Eyes: Negative for blurred vision.  Respiratory: Negative.  Negative for shortness of breath.   Cardiovascular: Negative.  Negative for chest pain and palpitations.  Gastrointestinal:       She c/o sensation of food getting stuck in her throat. Sx started several weeks ago. No nausea. She does admit that she is belching more. Increased indigestion as well.   Genitourinary: Negative.   Neurological: Negative.   Psychiatric/Behavioral: Negative.  Agitation:  Behavioral problem:      Today's Vitals   04/04/18 1525  BP: (!) 144/88  Pulse: 74  Temp: 98.5 F (36.9 C)  TempSrc: Oral  Weight: 202 lb 3.2 oz (91.7 kg)  Height: 5' 3.4" (1.61 m)  PainSc: 0-No pain   Body mass index is 35.37 kg/m.   Objective:  Physical Exam Vitals signs and nursing note reviewed.  Constitutional:      Appearance: Normal appearance. She is obese.  HENT:     Head: Normocephalic and atraumatic.  Cardiovascular:     Rate and Rhythm: Normal rate and regular rhythm.     Heart sounds: Normal heart sounds.  Pulmonary:     Effort: Pulmonary effort is normal.     Breath sounds: Normal breath sounds.  Abdominal:     General: Bowel sounds are normal.  Neurological:     General: No focal deficit present.     Mental Status: She is alert.  Psychiatric:        Mood and Affect: Mood normal.         Assessment And Plan:     1. Type 2 diabetes mellitus with stage 2 chronic kidney disease, with long-term current use of insulin (HCC)  I will check a1c today. Importance of regular exercise and dietary compliance was discussed with the patient.   2. Hypertensive heart and renal disease with renal failure, stage 1 through stage 4 or unspecified chronic kidney disease, without heart failure  Fair control. She will continue with current meds for now. She is encouraged to avoid adding salt to her foods.   3. Dysphagia, unspecified type  I will schedule her for barium swallow. I will make further  recommendations once her results are available for review. Due to concomitant indigestion, she was also given a weeks worth of Dexilant to take once daily.   - SLP modified barium swallow; Future - Ambulatory referral to Gastroenterology  4. PVD (peripheral vascular disease) (HCC)  Chronic, yet stable. Again, importance of statin compliance along with regular exercise was discussed with the patient.   5. Personal history of colonic polyps  Her last colonoscopy was in 2015, she is due this year. She is in agreement with her treatment plan.   - Ambulatory referral to Gastroenterology  6. 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 Importance of regular exercise was discussed with the patient. She is encouraged to aim for 30 minutes five days weekly.   Gwynneth Alimentobyn N Sinclaire Artiga, MD

## 2018-04-04 NOTE — Patient Instructions (Signed)
Barium Swallow A barium swallow is an X-ray test that is used to check:  Your throat.  The part of your body that moves food from your mouth to your stomach (esophagus).  Your stomach. For this test, you will drink a white liquid called barium. The liquid shows up well on X-rays and helps your doctor see problems. Tell a doctor about:  Any allergies you have. This should include any allergies to substances that are used in X-ray tests (contrast materials).  All medicines you are taking, including vitamins, herbs, eye drops, creams, and over-the-counter medicines.  Any surgeries you have had.  Any medical conditions you have.  Whether you are pregnant or may be pregnant. What are the risks? Generally, this is a safe test. However, problems may occur. These may include:  Trouble pooping.  A small amount of high-energy rays (radiation) going in your body.  Allergic reaction to the liquid that you drink for the test. What happens before the procedure?  Follow your doctor's instructions about limiting what you eat or drink.  Ask your doctor about changing or stopping your normal medicines. This is important if you take diabetes medicines or blood thinners. What happens during the procedure?  You will be positioned on an X-ray table.  You will drink the liquid barium. This liquid looks like a milkshake.  The X-ray table may be moved so you are sitting up more. You may also need to change your position on the table.  X-ray pictures will be shown on a screen (monitor). This lets the doctor watch the barium as it passes through your body. The procedure may vary among doctors and hospitals. What happens after the procedure?  Your poop (feces) may be white or gray for 2-3 days. You may be given a medicine that helps you poop (laxative). This helps the barium leave your body.  Ask your doctor when and how you will get your test results.  Talk with your doctor about what your  results mean. Follow these instructions at home:   Go back to your normal activities and your normal diet as told by your doctor.  To help you not have trouble pooping after this test, your doctor may tell you to: ? Drink enough fluid to keep your pee (urine) pale yellow. ? Eat foods that have a lot of fiber in them. These include fruits, vegetables, whole grains, and beans. ? Limit foods that are high in fat and sugars. These include fried or sweet foods. ? Take an over-the-counter or prescription medicine. Contact a doctor if you:  Have trouble pooping.  Cannot poop or pass gas.  Have pain or swelling in your belly.  Have a fever. Summary  A barium swallow is an X-ray test that is used to check your throat, your stomach, and the part of your body that moves food from your mouth to your stomach.  You will drink a white liquid called barium that shows up well on X-rays.  Talk with your doctor about what your results mean. This information is not intended to replace advice given to you by your health care provider. Make sure you discuss any questions you have with your health care provider. Document Released: 03/27/2010 Document Revised: 12/30/2016 Document Reviewed: 12/30/2016 Elsevier Interactive Patient Education  2019 [employer].

## 2018-04-05 NOTE — Addendum Note (Signed)
Addended by: Mariam Dollar on: 04/05/2018 04:57 PM   Modules accepted: Orders

## 2018-04-10 LAB — BMP8+EGFR
BUN/Creatinine Ratio: 14 (ref 12–28)
BUN: 18 mg/dL (ref 8–27)
CO2: 24 mmol/L (ref 20–29)
Calcium: 9.6 mg/dL (ref 8.7–10.3)
Chloride: 101 mmol/L (ref 96–106)
Creatinine, Ser: 1.31 mg/dL — ABNORMAL HIGH (ref 0.57–1.00)
GFR calc Af Amer: 47 mL/min/{1.73_m2} — ABNORMAL LOW (ref >59)
GFR calc non Af Amer: 40 mL/min/{1.73_m2} — ABNORMAL LOW (ref >59)
Glucose: 77 mg/dL (ref 65–99)
Potassium: 4.1 mmol/L (ref 3.5–5.2)
Sodium: 141 mmol/L (ref 134–144)

## 2018-04-10 LAB — HEMOGLOBIN A1C
Est. average glucose Bld gHb Est-mCnc: 137 mg/dL
Hgb A1c MFr Bld: 6.4 % — ABNORMAL HIGH (ref 4.8–5.6)

## 2018-04-17 ENCOUNTER — Other Ambulatory Visit: Payer: Self-pay | Admitting: Internal Medicine

## 2018-04-20 ENCOUNTER — Encounter: Payer: Self-pay | Admitting: Internal Medicine

## 2018-04-20 ENCOUNTER — Ambulatory Visit
Admission: RE | Admit: 2018-04-20 | Discharge: 2018-04-20 | Disposition: A | Discharge status: Home / Self Care | Payer: BC Managed Care – PPO | Source: Ambulatory Visit | Attending: Internal Medicine | Admitting: Internal Medicine

## 2018-04-20 ENCOUNTER — Ambulatory Visit: Payer: BC Managed Care – PPO | Admitting: Internal Medicine

## 2018-04-20 ENCOUNTER — Other Ambulatory Visit: Payer: Self-pay

## 2018-04-20 VITALS — BP 138/70 | HR 93 | Temp 98.7°F | Ht 63.2 in | Wt 202.0 lb

## 2018-04-20 DIAGNOSIS — M545 Low back pain, unspecified: Secondary | ICD-10-CM

## 2018-04-20 DIAGNOSIS — R350 Frequency of micturition: Secondary | ICD-10-CM | POA: Diagnosis not present

## 2018-04-20 DIAGNOSIS — R109 Unspecified abdominal pain: Secondary | ICD-10-CM

## 2018-04-20 LAB — POCT URINALYSIS DIPSTICK
Bilirubin, UA: NEGATIVE
Blood, UA: NEGATIVE
Glucose, UA: NEGATIVE
Ketones, UA: NEGATIVE
Leukocytes, UA: NEGATIVE
Nitrite, UA: NEGATIVE
Protein, UA: NEGATIVE
Spec Grav, UA: 1.015 (ref 1.010–1.025)
Urobilinogen, UA: 0.2 E.U./dL
pH, UA: 6 (ref 5.0–8.0)

## 2018-04-20 MED ORDER — CYCLOBENZAPRINE HCL 5 MG PO TABS: ORAL_TABLET | ORAL | 0 refills | Status: DC

## 2018-04-20 NOTE — Progress Notes (Signed)
Subjective:     Patient ID: Dawn Wood , female    DOB: 1945/09/22 , 73 y.o.   MRN: [id number]   Chief Complaint  Patient presents with  . Urinary Tract Infection    STOMACH PAIN/A WEEK/ACHING IN BACK/FREQUENT URINATION    HPI  She is here due to having lower abd pain and lower back x 1 week, No dysuria. Has had frequency. Has not noticed change in urine color or odor. States he glucose are well controlled. She admits of having constipation last week and had a hard time passing stool which was large. Since then she started taking Miralax qd and is having small amt BM's. Yesterday she felt a discomfort on RUQ, but not present right now.  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    Past Medical History:  Diagnosis Date  . Coronary artery disease   . Diabetes mellitus without complication (HCC)   . GERD (gastroesophageal reflux disease)   . Hypertension   . MI (myocardial infarction) (HCC)   . Peripheral vascular disease (HCC)      Family History  Problem Relation Age of Onset  . Aortic stenosis Daughter   . Heart disease Daughter        before age 26  . Hypertension Mother   . Cancer Father   . Hypertension Father   . Cancer Sister   . Breast cancer Neg Hx      Current Outpatient Medications:  .  amLODipine (NORVASC) 5 MG tablet, TAKE 1 TABLET(5 MG) BY MOUTH DAILY, Disp: 90 tablet, Rfl: 2 .  aspirin 81 MG tablet, Take 81 mg by mouth daily., Disp: , Rfl:  .  atorvastatin (LIPITOR) 20 MG tablet, TAKE 1 TABLET BY MOUTH DAILY (Patient taking differently: 3 (three) times a week. ), Disp: 90 tablet, Rfl: 1 .  BD PEN NEEDLE NANO U/F 32G X 4 MM MISC, USE WITH INSULIN PEN AS DIRECTED, Disp: 100 each, Rfl: 2 .  Cholecalciferol (VITAMIN D3) 2000 units TABS, Take 1 tablet by mouth daily., Disp: , Rfl:  .  clopidogrel (PLAVIX) 75 MG tablet, TAKE 1 TABLET(75 MG) BY MOUTH DAILY, Disp: 90 tablet, Rfl: 1 .  colchicine 0.6 MG tablet, Take 1 tablet (0.6 mg  total) by mouth 2 (two) times daily., Disp: 14 tablet, Rfl: 0 .  fluticasone (FLONASE) 50 MCG/ACT nasal spray, Place 1 spray into both nostrils daily as needed for allergies or rhinitis., Disp: 16 g, Rfl: 2 .  insulin aspart protamine- aspart (NOVOLOG MIX 70/30) (70-30) 100 UNIT/ML injection, Inject 40 Units into the skin 2 (two) times daily with a meal. , Disp: , Rfl:  .  metoprolol succinate (TOPROL-XL) 100 MG 24 hr tablet, TAKE 1 TABLET BY MOUTH EVERY DAY, Disp: 30 tablet, Rfl: 4 .  nitroGLYCERIN (NITROSTAT) 0.4 MG SL tablet, Place 1 tablet (0.4 mg total) under the tongue every 5 (five) minutes as needed for chest pain., Disp: 25 tablet, Rfl: 3 .  NOVOLOG MIX 70/30 FLEXPEN (70-30) 100 UNIT/ML FlexPen, INJECT 40 UNITS IN THE SKIN IN THE MORNING AND 45 UNITS IN THE EVENING AS DIRECTED, Disp: 25 pen, Rfl: 2 .  olmesartan (BENICAR) 40 MG tablet, Take 40 mg by mouth daily., Disp: , Rfl:  .  OZEMPIC, 0.25 OR 0.5 MG/DOSE, 2 MG/1.5ML SOPN, INJECT 0.5MG  SUBCUTANEOUS ONCE WEEKLY IN THE ABDOMEN,THIGHS, OR UPPER ARM ROTATING INJECTION SITES, Disp: 3 pen, Rfl: 1 .  spironolactone-hydrochlorothiazide (ALDACTAZIDE) 25-25 MG tablet, TAKE  1 TABLET BY MOUTH DAILY, Disp: 30 tablet, Rfl: 8   Allergies  Allergen Reactions  . Dust Mite Extract Other (See Comments)    REACTION: SNEEZING  . Other Other (See Comments)    ALLERGEN: DEODORIZERS SPRAY AND PERFUMES REACTION: SNEEZING  . Pollen Extract Other (See Comments)    Reaction unknown     Review of Systems  Constitutional: Negative for chills, diaphoresis and fever.  Gastrointestinal: Positive for abdominal distention, abdominal pain and constipation. Negative for blood in stool, diarrhea, nausea and vomiting.  Genitourinary: Positive for frequency and urgency. Negative for difficulty urinating, dysuria and pelvic pain.  Musculoskeletal: Positive for back pain.  Skin: Negative for rash and wound.  Neurological: Negative for weakness.     Today's Vitals    04/20/18 1536  BP: 138/70  Pulse: 93  Temp: 98.7 F (37.1 C)  TempSrc: Oral  SpO2: 96%  Weight: 202 lb (91.6 kg)  Height: 5' 3.2" (1.605 m)   Body mass index is 35.56 kg/m.   Objective:  Physical Exam Vitals signs and nursing note reviewed.  Constitutional:      General: She is not in acute distress.    Appearance: She is obese. She is not toxic-appearing.  HENT:     Head: Normocephalic.     Right Ear: External ear normal.     Left Ear: External ear normal.     Nose: Nose normal.  Eyes:     General: No scleral icterus.    Conjunctiva/sclera: Conjunctivae normal.  Neck:     Musculoskeletal: Neck supple.  Pulmonary:     Effort: Pulmonary effort is normal.  Abdominal:     General: Bowel sounds are normal. There is distension.     Palpations: Abdomen is soft. There is no mass.     Tenderness: There is abdominal tenderness. There is no right CVA tenderness, left CVA tenderness, guarding or rebound.     Hernia: No hernia is present.     Comments: Has diffuse tenderness on lower abd, worse suprabuccally.   Musculoskeletal: Normal range of motion.     Right lower leg: No edema.     Left lower leg: No edema.     Comments: BACK- no scoliosis or kyphosis. Has mild local tenderness on lower lumbosacral region. Neg SLR. Spine ROM to L laterally provoked pain and anteriorly > 80 degrees. She could not flex back due to pain.   Skin:    General: Skin is warm and dry.     Findings: No erythema or rash.  Neurological:     Mental Status: She is alert and oriented to person, place, and time.     Motor: No weakness.     Gait: Gait normal.     Deep Tendon Reflexes: Reflexes normal.  Psychiatric:        Mood and Affect: Mood normal.        Behavior: Behavior normal.        Thought Content: Thought content normal.        Judgment: Judgment normal.    Assessment And Plan:  1. Abdominal pain, unspecified abdominal location - DG Abd 1 View; Future- pending - POCT Urinalysis Dipstick  (81002)- negative       We will call her when the abd xray report is back.  2- Urinary frequency- unknown cause, no sign of UTI today.    3- Lumbago without sciatica- acute. Advised to apply heat for 15 min 2-4 times a day on back and do stretches  after that. I will have her try Flexeril which she has taken in the past.    Yomaris Palecek RODRIGUEZ-SOUTHWORTH, PA-C

## 2018-04-25 ENCOUNTER — Other Ambulatory Visit (HOSPITAL_COMMUNITY): Payer: Self-pay

## 2018-04-25 DIAGNOSIS — R1310 Dysphagia, unspecified: Secondary | ICD-10-CM

## 2018-05-11 ENCOUNTER — Ambulatory Visit (HOSPITAL_COMMUNITY)
Admission: RE | Admit: 2018-05-11 | Discharge: 2018-05-11 | Disposition: A | Discharge status: Home / Self Care | Payer: BC Managed Care – PPO | Source: Ambulatory Visit | Attending: Internal Medicine | Admitting: Internal Medicine

## 2018-05-11 DIAGNOSIS — R1310 Dysphagia, unspecified: Secondary | ICD-10-CM | POA: Diagnosis present

## 2018-05-11 NOTE — Progress Notes (Addendum)
Modified Barium Swallow Progress Note  Patient Details  Name: Dawn Wood MRN: [id number] Date of Birth: 1945/08/14  Today's Date: 05/11/2018  Modified Barium Swallow completed.  Full report located under Chart Review in the Imaging Section.  Brief recommendations include the following:  Clinical Impression  Pt was seen in radiology suite for modified barium swallow study. She was seated upright in swallow chair for the study which was conducted in lateral view. Trials of puree solids, regular texture solids, thin liquids (via straw), and a 83mm barium tablet with thin liquids were administered. Her oropharyngeal swallow mechanism was within normal limits despite repeated attempts being needed to swallow the barium tablet. It is recommended that a regular texture diet with thin liquids be continued at this time. No significant findings were noted duing esophageal screening; however, considering the pt's symptoms, further assessment of the esophageal phase of the swallow may be beneficial.    Swallow Evaluation Recommendations   Recommended Consults: Consider esophageal assessment   SLP Diet Recommendations: Regular solids;Thin liquid   Liquid Administration via: Cup;Straw   Medication Administration: Whole meds with liquid       Compensations: Slow rate;Small sips/bites;Follow solids with liquid   Postural Changes: Seated upright at 90 degrees          Shirrell Solinger I. Vear Clock, MS, CCC-SLP Acute Rehabilitation Services Office number [phone] Pager [phone]   Scheryl Marten 05/11/2018,2:36 PM

## 2018-05-15 ENCOUNTER — Telehealth: Payer: Self-pay

## 2018-05-15 ENCOUNTER — Other Ambulatory Visit: Payer: BC Managed Care – PPO

## 2018-05-15 DIAGNOSIS — N289 Disorder of kidney and ureter, unspecified: Secondary | ICD-10-CM

## 2018-05-15 NOTE — Telephone Encounter (Signed)
Dr. Katrinka Blazing, pt needs colonoscopy - OK to hold ASA and plavix for 5 days prior.  I have a call into pt to see how she is doing.  If stable will proceed with procedure holding the above.

## 2018-05-15 NOTE — Telephone Encounter (Signed)
   [facility] Pre-operative Risk Assessment    Request for surgical clearance:  1. What type of surgery is being performed? Colonoscopy   2. When is this surgery scheduled? 06/26/18   3. What type of clearance is required (medical clearance vs. Pharmacy clearance to hold med vs. Both)? Both  4. Are there any medications that need to be held prior to surgery and how long? Plavix and Aspirin and how many days prior to procedure   5. Practice name and name of physician performing surgery? [facility] Dr Collene Mares    6. What is your office phone number [phone]    7.   What is your office fax number [phone]  8.   Anesthesia type (None, local, MAC, general) ? Propofol   Jacinta Shoe 05/15/2018, 3:44 PM  _________________________________________________________________   (provider comments below)

## 2018-05-16 ENCOUNTER — Other Ambulatory Visit: Payer: Self-pay | Admitting: Cardiology

## 2018-05-16 LAB — BMP8+EGFR
BUN/Creatinine Ratio: 14 (ref 12–28)
BUN: 14 mg/dL (ref 8–27)
CO2: 24 mmol/L (ref 20–29)
Calcium: 9.4 mg/dL (ref 8.7–10.3)
Chloride: 102 mmol/L (ref 96–106)
Creatinine, Ser: 1 mg/dL (ref 0.57–1.00)
GFR calc Af Amer: 65 mL/min/{1.73_m2} (ref >59)
GFR calc non Af Amer: 56 mL/min/{1.73_m2} — ABNORMAL LOW (ref >59)
Glucose: 60 mg/dL — ABNORMAL LOW (ref 65–99)
Potassium: 4 mmol/L (ref 3.5–5.2)
Sodium: 142 mmol/L (ref 134–144)

## 2018-05-16 MED ORDER — NITROGLYCERIN 0.4 MG SL SUBL: 0.4000 mg | SUBLINGUAL_TABLET | SUBLINGUAL | 3 refills | Status: DC | PRN

## 2018-05-16 NOTE — Telephone Encounter (Signed)
   Primary Cardiologist: Lesleigh Noe, MD  Chart reviewed as part of pre-operative protocol coverage. Patient was contacted 05/16/2018 in reference to pre-operative risk assessment for pending surgery as outlined below.  Dawn Wood was last seen on 09/14/17 by Dr. Katrinka Blazing.  Since that day, Dawn Wood has done well with prior stent to LAD in 2015.  She has no chest pain and no SOB  It is ok to hold asprin and plavix 5 days prior to procedure..  Therefore, based on ACC/AHA guidelines, the patient would be at acceptable risk for the planned procedure without further cardiovascular testing.   I will route this recommendation to the requesting party via Epic fax function and remove from pre-op pool.  Please call with questions.  Nada Boozer, NP 05/16/2018, 4:35 PM

## 2018-05-16 NOTE — Telephone Encounter (Signed)
Yes, okay to hold aspirin and plavix.

## 2018-05-19 ENCOUNTER — Telehealth: Payer: Self-pay

## 2018-05-19 NOTE — Telephone Encounter (Signed)
-----   Message from Dorothyann Peng, MD sent at 05/12/2018  5:15 PM EST ----- Does she understand recommendations from speech therapist? Does she want to go to GI to further discuss her symptoms?

## 2018-05-19 NOTE — Telephone Encounter (Signed)
Left the pt a message to call back so that I can let her know that Dr. Allyne Gee wants to know if she understood the recommendations from the speech therapist and if the pt wanted to go to GI to further discuss her symptoms.

## 2018-05-21 ENCOUNTER — Other Ambulatory Visit: Payer: Self-pay | Admitting: Internal Medicine

## 2018-05-22 ENCOUNTER — Other Ambulatory Visit: Payer: Self-pay

## 2018-05-22 MED ORDER — OLMESARTAN MEDOXOMIL 40 MG PO TABS: 40.0000 mg | ORAL_TABLET | Freq: Every day | ORAL | 2 refills | Status: DC

## 2018-05-22 MED ORDER — DEXLANSOPRAZOLE 60 MG PO CPDR: 60.0000 mg | DELAYED_RELEASE_CAPSULE | Freq: Every day | ORAL | 1 refills | Status: DC

## 2018-05-25 ENCOUNTER — Other Ambulatory Visit: Payer: Self-pay

## 2018-05-25 ENCOUNTER — Other Ambulatory Visit: Payer: Self-pay | Admitting: Internal Medicine

## 2018-05-25 MED ORDER — COLCHICINE 0.6 MG PO TABS: 0.6000 mg | ORAL_TABLET | Freq: Every day | ORAL | 0 refills | Status: DC

## 2018-06-29 ENCOUNTER — Other Ambulatory Visit: Payer: Self-pay

## 2018-06-29 ENCOUNTER — Encounter: Payer: Self-pay | Admitting: Internal Medicine

## 2018-06-29 ENCOUNTER — Ambulatory Visit: Payer: BC Managed Care – PPO | Admitting: Internal Medicine

## 2018-06-29 VITALS — BP 144/86 | HR 76 | Temp 98.7°F | Ht 63.2 in | Wt 206.0 lb

## 2018-06-29 DIAGNOSIS — E1122 Type 2 diabetes mellitus with diabetic chronic kidney disease: Secondary | ICD-10-CM

## 2018-06-29 DIAGNOSIS — N182 Chronic kidney disease, stage 2 (mild): Secondary | ICD-10-CM | POA: Diagnosis not present

## 2018-06-29 DIAGNOSIS — H6123 Impacted cerumen, bilateral: Secondary | ICD-10-CM

## 2018-06-29 DIAGNOSIS — Z794 Long term (current) use of insulin: Secondary | ICD-10-CM

## 2018-06-29 DIAGNOSIS — H9313 Tinnitus, bilateral: Secondary | ICD-10-CM

## 2018-06-29 DIAGNOSIS — M1A3490 Chronic gout due to renal impairment, unspecified hand, without tophus (tophi): Secondary | ICD-10-CM

## 2018-06-29 DIAGNOSIS — I1310 Hypertensive heart and chronic kidney disease without heart failure, with stage 1 through stage 4 chronic kidney disease, or unspecified chronic kidney disease: Secondary | ICD-10-CM

## 2018-06-29 MED ORDER — METFORMIN HCL ER 500 MG PO TB24: ORAL_TABLET | ORAL | 1 refills | Status: DC

## 2018-06-29 NOTE — Progress Notes (Signed)
Subjective:     Patient ID: Dawn Wood , female    DOB: [birth year] , 73 y.o.   MRN: [id number]   Chief Complaint  Patient presents with  . Diabetes  . Hypertension    HPI  Diabetes  She presents for her follow-up diabetic visit. She has type 2 diabetes mellitus. Her disease course has been stable. There are no hypoglycemic associated symptoms. Pertinent negatives for diabetes include no blurred vision and no chest pain. There are no hypoglycemic complications. Diabetic complications include nephropathy. Risk factors for coronary artery disease include diabetes mellitus, dyslipidemia, hypertension, obesity, sedentary lifestyle and post-menopausal. Current diabetic treatment includes insulin injections. She is following a diabetic diet. She participates in exercise intermittently. Her home blood glucose trend is fluctuating minimally. Her breakfast blood glucose is taken between 8-9 am. Her breakfast blood glucose range is generally 110-130 mg/dl. Eye exam is current.  Hypertension  This is a chronic problem. The current episode started more than 1 year ago. The problem has been gradually improving since onset. Pertinent negatives include no blurred vision, chest pain, palpitations or shortness of breath. Risk factors for coronary artery disease include diabetes mellitus, dyslipidemia, sedentary lifestyle and post-menopausal state. Hypertensive end-organ damage includes kidney disease.     Past Medical History:  Diagnosis Date  . Coronary artery disease   . Diabetes mellitus without complication ([place])   . GERD (gastroesophageal reflux disease)   . Hypertension   . MI (myocardial infarction) ([place])   . Peripheral vascular disease ([place])      Family History  Problem Relation Age of Onset  . Aortic stenosis Daughter   . Heart disease Daughter        before age 71  . Hypertension Mother   . Cancer Father   . Hypertension Father   . Cancer Sister   . Breast cancer Neg Hx       Current Outpatient Medications:  .  amLODipine (NORVASC) 5 MG tablet, TAKE 1 TABLET(5 MG) BY MOUTH DAILY, Disp: 90 tablet, Rfl: 2 .  aspirin 81 MG tablet, Take 81 mg by mouth daily., Disp: , Rfl:  .  BD PEN NEEDLE NANO U/F 32G X 4 MM MISC, USE WITH INSULIN PEN AS DIRECTED, Disp: 100 each, Rfl: 2 .  Cholecalciferol (VITAMIN D3) 2000 units TABS, Take 1 tablet by mouth daily., Disp: , Rfl:  .  clopidogrel (PLAVIX) 75 MG tablet, TAKE 1 TABLET(75 MG) BY MOUTH DAILY, Disp: 90 tablet, Rfl: 1 .  dexlansoprazole (DEXILANT) 60 MG capsule, Take 1 capsule (60 mg total) by mouth daily., Disp: 30 capsule, Rfl: 1 .  fluticasone (FLONASE) 50 MCG/ACT nasal spray, Place 1 spray into both nostrils daily as needed for allergies or rhinitis., Disp: 16 g, Rfl: 2 .  metoprolol succinate (TOPROL-XL) 100 MG 24 hr tablet, TAKE 1 TABLET BY MOUTH EVERY DAY, Disp: 30 tablet, Rfl: 4 .  nitroGLYCERIN (NITROSTAT) 0.4 MG SL tablet, Place 1 tablet (0.4 mg total) under the tongue every 5 (five) minutes as needed for chest pain., Disp: 25 tablet, Rfl: 3 .  NOVOLOG MIX 70/30 FLEXPEN (70-30) 100 UNIT/ML FlexPen, INJECT 40 UNITS IN THE SKIN IN THE MORNING AND 45 UNITS IN THE EVENING AS DIRECTED, Disp: 25 pen, Rfl: 2 .  olmesartan (BENICAR) 40 MG tablet, Take 1 tablet (40 mg total) by mouth daily., Disp: 90 tablet, Rfl: 2 .  OZEMPIC, 0.25 OR 0.5 MG/DOSE, 2 MG/1.5ML SOPN, INJECT 0.5MG SUBCUTANEOUS ONCE WEEKLY IN THE ABDOMEN,THIGHS, OR UPPER ARM  ROTATING INJECTION SITES, Disp: 3 pen, Rfl: 1 .  spironolactone-hydrochlorothiazide (ALDACTAZIDE) 25-25 MG tablet, TAKE 1 TABLET BY MOUTH DAILY, Disp: 30 tablet, Rfl: 8 .  atorvastatin (LIPITOR) 20 MG tablet, TAKE 1 TABLET BY MOUTH DAILY (Patient not taking: No sig reported), Disp: 90 tablet, Rfl: 1 .  colchicine 0.6 MG tablet, Take 1 tablet (0.6 mg total) by mouth daily. (Patient not taking: Reported on 06/29/2018), Disp: 30 tablet, Rfl: 0 .  cyclobenzaprine (FLEXERIL) 5 MG tablet, 1-2 tid  prn muscle spasms and back pain (Patient not taking: Reported on 06/29/2018), Disp: 30 tablet, Rfl: 0 .  metFORMIN (GLUCOPHAGE-XR) 500 MG 24 hr tablet, 1 tablet by mouth daily with a meal, Disp: 90 tablet, Rfl: 1   Allergies  Allergen Reactions  . Dust Mite Extract Other (See Comments)    REACTION: SNEEZING  . Other Other (See Comments)    ALLERGEN: DEODORIZERS SPRAY AND PERFUMES REACTION: SNEEZING  . Pollen Extract Other (See Comments)    Reaction unknown     Review of Systems  Constitutional: Negative.   HENT: Positive for tinnitus (she reports her sx are worsened at night. she denies hearing loss).   Eyes: Negative for blurred vision.  Respiratory: Negative.  Negative for shortness of breath.   Cardiovascular: Negative.  Negative for chest pain and palpitations.  Gastrointestinal: Negative.   Neurological: Negative.   Psychiatric/Behavioral: Negative.      Today's Vitals   06/29/18 1157  BP: (!) 144/86  Pulse: 76  Temp: 98.7 F (37.1 C)  TempSrc: Oral  Weight: 206 lb (93.4 kg)  Height: 5' 3.2" (1.605 m)  PainSc: 0-No pain   Body mass index is 36.26 kg/m.   Objective:  Physical Exam Vitals signs and nursing note reviewed.  Constitutional:      Appearance: Normal appearance.  HENT:     Head: Normocephalic and atraumatic.     Right Ear: There is impacted cerumen.     Left Ear: There is impacted cerumen.  Cardiovascular:     Rate and Rhythm: Normal rate and regular rhythm.     Heart sounds: Normal heart sounds.  Pulmonary:     Effort: Pulmonary effort is normal.     Breath sounds: Normal breath sounds.  Skin:    General: Skin is warm.  Neurological:     General: No focal deficit present.     Mental Status: She is alert.  Psychiatric:        Mood and Affect: Mood normal.        Behavior: Behavior normal.         Assessment And Plan:     1. Type 2 diabetes mellitus with stage 2 chronic kidney disease, with long-term current use of insulin ([place])  I  will check labs as listed below.  Importance of dietary, exercise and medication compliance was discussed with the patient.   - Lipid panel - CMP14+EGFR - Hemoglobin A1c  2. Hypertensive heart and renal disease with renal failure, stage 1 through stage 4 or unspecified chronic kidney disease, without heart failure  Fair control. She will continue with current meds. She is encouraged to avoid adding salt to her foods.   3. 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 She is encouraged to stay well hydrated and avoid foods which trigger her sx.  - Uric acid  4. Class 2 severe obesity with body mass index (BMI) of 35 to 39.9  with serious comorbidity ([place])  Importance of achieving optimal weight to decrease risk of cardiovascular disease and cancers was discussed with the patient in full detail. She is encouraged to start slowly - start with 10 minutes twice daily at least three to four days per week and to gradually build to 30 minutes five days weekly. She was given tips to incorporate more activity into her daily routine - take stairs when possible, park farther away from grocery stores, etc.      5. Tinnitus of both ears  Pt advised that it is possible the cerumen impaction is contributing to her symptoms. She will let me know if her sx persist. She agrees to call me next week for f/u.   6. Bilateral impacted cerumen  AFTER OBTAINING VERBAL CONSENT, BOTH EARS WERE FLUSHED BY IRRIGATION. SHE TOLERATED PROCEDURE WELL WITHOUT ANY COMPLICATIONS. NO TM ABNORMALITIES WERE NOTED.  - Ear Lavage        [name], MD    THE PATIENT IS ENCOURAGED TO PRACTICE SOCIAL DISTANCING DUE TO THE COVID-19 PANDEMIC.

## 2018-06-29 NOTE — Patient Instructions (Signed)
Earwax Buildup, Adult  The ears produce a substance called earwax that helps keep bacteria out of the ear and protects the skin in the ear canal. Occasionally, earwax can build up in the ear and cause discomfort or hearing loss.  What increases the risk?  This condition is more likely to develop in people who:  · Are female.  · Are elderly.  · Naturally produce more earwax.  · Clean their ears often with cotton swabs.  · Use earplugs often.  · Use in-ear headphones often.  · Wear hearing aids.  · Have narrow ear canals.  · Have earwax that is overly thick or sticky.  · Have eczema.  · Are dehydrated.  · Have excess hair in the ear canal.  What are the signs or symptoms?  Symptoms of this condition include:  · Reduced or muffled hearing.  · A feeling of fullness in the ear or feeling that the ear is plugged.  · Fluid coming from the ear.  · Ear pain.  · Ear itch.  · Ringing in the ear.  · Coughing.  · An obvious piece of earwax that can be seen inside the ear canal.  How is this diagnosed?  This condition may be diagnosed based on:  · Your symptoms.  · Your medical history.  · An ear exam. During the exam, your health care provider will look into your ear with an instrument called an otoscope.  You may have tests, including a hearing test.  How is this treated?  This condition may be treated by:  · Using ear drops to soften the earwax.  · Having the earwax removed by a health care provider. The health care provider may:  ? Flush the ear with water.  ? Use an instrument that has a loop on the end (curette).  ? Use a suction device.  · Surgery to remove the wax buildup. This may be done in severe cases.  Follow these instructions at home:    · Take over-the-counter and prescription medicines only as told by your health care provider.  · Do not put any objects, including cotton swabs, into your ear. You can clean the opening of your ear canal with a washcloth or facial tissue.  · Follow instructions from your health care  provider about cleaning your ears. Do not over-clean your ears.  · Drink enough fluid to keep your urine clear or pale yellow. This will help to thin the earwax.  · Keep all follow-up visits as told by your health care provider. If earwax builds up in your ears often or if you use hearing aids, consider seeing your health care provider for routine, preventive ear cleanings. Ask your health care provider how often you should schedule your cleanings.  · If you have hearing aids, clean them according to instructions from the manufacturer and your health care provider.  Contact a health care provider if:  · You have ear pain.  · You develop a fever.  · You have blood, pus, or other fluid coming from your ear.  · You have hearing loss.  · You have ringing in your ears that does not go away.  · Your symptoms do not improve with treatment.  · You feel like the room is spinning (vertigo).  Summary  · Earwax can build up in the ear and cause discomfort or hearing loss.  · The most common symptoms of this condition include reduced or muffled hearing and a feeling of   fullness in the ear or feeling that the ear is plugged.  · This condition may be diagnosed based on your symptoms, your medical history, and an ear exam.  · This condition may be treated by using ear drops to soften the earwax or by having the earwax removed by a health care provider.  · Do not put any objects, including cotton swabs, into your ear. You can clean the opening of your ear canal with a washcloth or facial tissue.  This information is not intended to replace advice given to you by your health care provider. Make sure you discuss any questions you have with your health care provider.  Document Released: 04/01/2004 Document Revised: 02/03/2017 Document Reviewed: 05/05/2016  Elsevier Interactive Patient Education © 2019 [employer]

## 2018-06-30 ENCOUNTER — Other Ambulatory Visit: Payer: Self-pay

## 2018-06-30 ENCOUNTER — Other Ambulatory Visit: Payer: Self-pay | Admitting: Internal Medicine

## 2018-06-30 LAB — CMP14+EGFR
ALT: 16 IU/L (ref 0–32)
AST: 15 IU/L (ref 0–40)
Albumin/Globulin Ratio: 1.3 (ref 1.2–2.2)
Albumin: 4.2 g/dL (ref 3.7–4.7)
Alkaline Phosphatase: 84 IU/L (ref 39–117)
BUN/Creatinine Ratio: 14 (ref 12–28)
BUN: 15 mg/dL (ref 8–27)
Bilirubin Total: 0.5 mg/dL (ref 0.0–1.2)
CO2: 22 mmol/L (ref 20–29)
Calcium: 9.2 mg/dL (ref 8.7–10.3)
Chloride: 101 mmol/L (ref 96–106)
Creatinine, Ser: 1.05 mg/dL — ABNORMAL HIGH (ref 0.57–1.00)
GFR calc Af Amer: 61 mL/min/{1.73_m2} (ref >59)
GFR calc non Af Amer: 53 mL/min/{1.73_m2} — ABNORMAL LOW (ref >59)
Globulin, Total: 3.3 g/dL (ref 1.5–4.5)
Glucose: 129 mg/dL — ABNORMAL HIGH (ref 65–99)
Potassium: 4 mmol/L (ref 3.5–5.2)
Sodium: 141 mmol/L (ref 134–144)
Total Protein: 7.5 g/dL (ref 6.0–8.5)

## 2018-06-30 LAB — LIPID PANEL
Chol/HDL Ratio: 4.1 ratio (ref 0.0–4.4)
Cholesterol, Total: 185 mg/dL (ref 100–199)
HDL: 45 mg/dL (ref >39)
LDL Calculated: 109 mg/dL — ABNORMAL HIGH (ref 0–99)
Triglycerides: 153 mg/dL — ABNORMAL HIGH (ref 0–149)
VLDL Cholesterol Cal: 31 mg/dL (ref 5–40)

## 2018-06-30 LAB — HEMOGLOBIN A1C
Est. average glucose Bld gHb Est-mCnc: 140 mg/dL
Hgb A1c MFr Bld: 6.5 % — ABNORMAL HIGH (ref 4.8–5.6)

## 2018-06-30 LAB — URIC ACID: Uric Acid: 8.4 mg/dL — ABNORMAL HIGH (ref 2.5–7.1)

## 2018-06-30 MED ORDER — ATORVASTATIN CALCIUM 20 MG PO TABS: ORAL_TABLET | ORAL | 1 refills | Status: DC

## 2018-06-30 MED ORDER — COLCHICINE 0.6 MG PO TABS: 0.6000 mg | ORAL_TABLET | Freq: Every day | ORAL | 1 refills | Status: DC

## 2018-07-04 ENCOUNTER — Ambulatory Visit: Payer: BC Managed Care – PPO | Admitting: Internal Medicine

## 2018-07-05 ENCOUNTER — Other Ambulatory Visit: Payer: Self-pay | Admitting: Internal Medicine

## 2018-07-05 MED ORDER — ALLOPURINOL 100 MG PO TABS: 100.0000 mg | ORAL_TABLET | Freq: Every day | ORAL | 2 refills | Status: DC

## 2018-07-06 ENCOUNTER — Telehealth: Payer: Self-pay

## 2018-07-06 NOTE — Telephone Encounter (Signed)
The pt was told that Dr. Allyne Gee sent in allopurinol to the pharmacy for the pt because the pt had a concern about taking the [place] medication and to keep her next appt so that she can have a f/u in 4 wks.

## 2018-07-14 ENCOUNTER — Other Ambulatory Visit: Payer: Self-pay | Admitting: Internal Medicine

## 2018-07-25 ENCOUNTER — Encounter: Payer: Self-pay | Admitting: Internal Medicine

## 2018-08-12 ENCOUNTER — Other Ambulatory Visit: Payer: Self-pay | Admitting: Internal Medicine

## 2018-08-14 ENCOUNTER — Ambulatory Visit: Payer: BC Managed Care – PPO | Admitting: Internal Medicine

## 2018-08-14 ENCOUNTER — Encounter: Payer: Self-pay | Admitting: Internal Medicine

## 2018-08-14 ENCOUNTER — Other Ambulatory Visit: Payer: Self-pay

## 2018-08-14 VITALS — BP 134/66 | HR 82 | Temp 98.4°F | Ht 63.6 in | Wt 208.4 lb

## 2018-08-14 DIAGNOSIS — M1A3490 Chronic gout due to renal impairment, unspecified hand, without tophus (tophi): Secondary | ICD-10-CM | POA: Diagnosis not present

## 2018-08-14 MED ORDER — SEMAGLUTIDE(0.25 OR 0.5MG/DOS) 2 MG/1.5ML ~~LOC~~ SOPN: 0.5000 mg | PEN_INJECTOR | SUBCUTANEOUS | 1 refills | Status: DC

## 2018-08-14 MED ORDER — INSULIN PEN NEEDLE 32G X 4 MM MISC: 2 refills | Status: DC

## 2018-08-14 NOTE — Patient Instructions (Signed)

## 2018-08-19 NOTE — Progress Notes (Signed)
Subjective:     Patient ID: Dawn Wood , female    DOB: Feb 01, 1946 , 73 y.o.   MRN: [id number]   Chief Complaint  Patient presents with  . Gout    HPI  She is here today for f/u gout. She was started on allopurinol at her last visit. However, she did not start the medication. She reports that she read the side effects and decided she did not want to take it. She admits that she did attempt to speak to myself or pharmacist about any concerns she had.     Past Medical History:  Diagnosis Date  . Coronary artery disease   . Diabetes mellitus without complication ([place])   . GERD (gastroesophageal reflux disease)   . Hypertension   . MI (myocardial infarction) ([place])   . Peripheral vascular disease ([place])      Family History  Problem Relation Age of Onset  . Aortic stenosis Daughter   . Heart disease Daughter        before age 40  . Hypertension Mother   . Cancer Father   . Hypertension Father   . Cancer Sister   . Breast cancer Neg Hx      Current Outpatient Medications:  .  amLODipine (NORVASC) 5 MG tablet, TAKE 1 TABLET(5 MG) BY MOUTH DAILY, Disp: 90 tablet, Rfl: 2 .  aspirin 81 MG tablet, Take 81 mg by mouth daily., Disp: , Rfl:  .  Cholecalciferol (VITAMIN D3) 2000 units TABS, Take 1 tablet by mouth daily., Disp: , Rfl:  .  clopidogrel (PLAVIX) 75 MG tablet, TAKE 1 TABLET(75 MG) BY MOUTH DAILY, Disp: 90 tablet, Rfl: 1 .  colchicine 0.6 MG tablet, Take 1 tablet (0.6 mg total) by mouth daily., Disp: 30 tablet, Rfl: 1 .  dexlansoprazole (DEXILANT) 60 MG capsule, Take 1 capsule (60 mg total) by mouth daily., Disp: 30 capsule, Rfl: 1 .  fluticasone (FLONASE) 50 MCG/ACT nasal spray, Place 1 spray into both nostrils daily as needed for allergies or rhinitis., Disp: 16 g, Rfl: 2 .  Insulin Pen Needle (BD PEN NEEDLE NANO U/F) 32G X 4 MM MISC, USE WITH INSULIN PEN AS DIRECTED, Disp: 100 each, Rfl: 2 .  metFORMIN (GLUCOPHAGE-XR) 500 MG 24 hr tablet, 1 tablet by mouth daily with  a meal, Disp: 90 tablet, Rfl: 1 .  allopurinol (ZYLOPRIM) 100 MG tablet, Take 1 tablet (100 mg total) by mouth daily. (Patient not taking: Reported on 08/14/2018), Disp: 30 tablet, Rfl: 2 .  atorvastatin (LIPITOR) 20 MG tablet, Take 1 tablet by mouth on Monday, Wednesday, Friday (Patient not taking: Reported on 08/14/2018), Disp: 90 tablet, Rfl: 1 .  metoprolol succinate (TOPROL-XL) 100 MG 24 hr tablet, TAKE 1 TABLET BY MOUTH EVERY DAY, Disp: 30 tablet, Rfl: 4 .  nitroGLYCERIN (NITROSTAT) 0.4 MG SL tablet, Place 1 tablet (0.4 mg total) under the tongue every 5 (five) minutes as needed for chest pain., Disp: 25 tablet, Rfl: 3 .  NOVOLOG MIX 70/30 FLEXPEN (70-30) 100 UNIT/ML FlexPen, INJECT 40 UNITS IN THE SKIN IN THE MORNING AND 45 UNITS IN THE EVENING AS DIRECTED, Disp: 25 pen, Rfl: 2 .  olmesartan (BENICAR) 40 MG tablet, Take 1 tablet (40 mg total) by mouth daily., Disp: 90 tablet, Rfl: 2 .  OZEMPIC, 0.25 OR 0.5 MG/DOSE, 2 MG/1.5ML SOPN, INJECT 0.5 MG SUBCUTANEOUS ONCE WEEKLY INTO THE ABDOMEN, THIGHS OR UPPER ARM, ROTATING INJECTION SITES, Disp: 4.5 mL, Rfl: 6 .  Semaglutide,0.25 or 0.5MG /DOS, (OZEMPIC, 0.25  OR 0.5 MG/DOSE,) 2 MG/1.5ML SOPN, Inject 0.5 mg into the skin once a week., Disp: 3 pen, Rfl: 1 .  spironolactone-hydrochlorothiazide (ALDACTAZIDE) 25-25 MG tablet, TAKE 1 TABLET BY MOUTH DAILY, Disp: 30 tablet, Rfl: 8   Allergies  Allergen Reactions  . Dust Mite Extract Other (See Comments)    REACTION: SNEEZING  . Other Other (See Comments)    ALLERGEN: DEODORIZERS SPRAY AND PERFUMES REACTION: SNEEZING  . Pollen Extract Other (See Comments)    Reaction unknown     Review of Systems  Constitutional: Negative.   Respiratory: Negative.   Cardiovascular: Negative.   Gastrointestinal: Negative.   Neurological: Negative.   Psychiatric/Behavioral: Negative.      Today's Vitals   08/14/18 1107  BP: 134/66  Pulse: 82  Temp: 98.4 F (36.9 C)  Weight: 208 lb 6.4 oz (94.5 kg)  Height: 5'  3.6" (1.615 m)  PainSc: 4   PainLoc: Ankle   Body mass index is 36.22 kg/m.   Objective:  Physical Exam Vitals signs and nursing note reviewed.  Constitutional:      Appearance: Normal appearance.  HENT:     Head: Normocephalic and atraumatic.  Cardiovascular:     Rate and Rhythm: Normal rate and regular rhythm.     Heart sounds: Normal heart sounds.  Pulmonary:     Effort: Pulmonary effort is normal.     Breath sounds: Normal breath sounds.  Skin:    General: Skin is warm.  Neurological:     General: No focal deficit present.     Mental Status: She is alert.  Psychiatric:        Mood and Affect: Mood normal.        Behavior: Behavior normal.         Assessment And Plan:     1. 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 I offered to refer her to rheumatology for further consultation, she declined. She agrees to try the allopurinol. She is advised to take once daily. Possible side effects were discussed with the patient. She will rto in six weeks for re-evaluation.   2. 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 She is encouraged to start slowly - start with 10 minutes twice daily at least three to four days per week and to gradually build to 30 minutes five days weekly. She was given tips to incorporate more activity into her daily routine - take stairs when possible, park farther away from her job, grocery stores, etc.    Gwynneth Alimentobyn N Montrice Gracey, MD    THE PATIENT IS ENCOURAGED TO PRACTICE SOCIAL DISTANCING DUE TO THE COVID-19 PANDEMIC.

## 2018-08-24 ENCOUNTER — Ambulatory Visit (INDEPENDENT_AMBULATORY_CARE_PROVIDER_SITE_OTHER): Payer: BC Managed Care – PPO

## 2018-08-24 ENCOUNTER — Ambulatory Visit (HOSPITAL_COMMUNITY)
Admission: EM | Admit: 2018-08-24 | Discharge: 2018-08-24 | Disposition: A | Discharge status: Home / Self Care | Payer: BC Managed Care – PPO | Attending: Family Medicine | Admitting: Family Medicine

## 2018-08-24 ENCOUNTER — Encounter (HOSPITAL_COMMUNITY): Payer: Self-pay | Admitting: Emergency Medicine

## 2018-08-24 DIAGNOSIS — W19XXXA Unspecified fall, initial encounter: Secondary | ICD-10-CM | POA: Diagnosis not present

## 2018-08-24 DIAGNOSIS — M79601 Pain in right arm: Secondary | ICD-10-CM

## 2018-08-24 MED ORDER — TRAMADOL HCL 50 MG PO TABS: 50.0000 mg | ORAL_TABLET | Freq: Four times a day (QID) | ORAL | 0 refills | Status: DC | PRN

## 2018-08-24 NOTE — ED Triage Notes (Signed)
Pt states she thinks she missed a step on her porch and fell last night around 7pm, states she fell on her R side, c/o R shoulder pain, R arm pain. Denies hitting head or LOC.

## 2018-08-24 NOTE — Discharge Instructions (Addendum)
Wear sling for 2 to 3 days Be sure to do pendulum exercises a couple times a day and to maintain motion in the shoulder Ice to painful areas to reduce pain and swelling Take tramadol as needed for severe pain Call your PCP if not improving by Monday

## 2018-08-24 NOTE — ED Provider Notes (Signed)
[facility]    CSN: [id number] Arrival date & time: 08/24/18  [id number]     History   Chief Complaint Chief Complaint  Patient presents with  . Fall  . Arm Pain    HPI Dawn Wood is a 73 y.o. female.   HPI  Patient is here for right arm pain.  She states she was talking to somebody while walking out her front door and "missed a step" fell forward and landed on her right arm.  Her right arm was immediately painful.  She had to have help getting up off the ground.  She rested and put ice on it yesterday.  Today it still very painful.  She is here for evaluation and possible x-rays.  She states they were "knots" along her forearm and points to her mid ulna region.  No bruising or swelling seen today.  She states she does have a prior history of a "torn tendon" in his right shoulder.  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  No numbness or weakness.  Past Medical History:  Diagnosis Date  . Coronary artery disease   . Diabetes mellitus without complication (HCC)   . GERD (gastroesophageal reflux disease)   . Hypertension   . MI (myocardial infarction) (HCC)   . Peripheral vascular disease [facility](HCC)     Patient Active Problem List   Diagnosis Date Noted  . Gout 12/26/2017  . Dyslipidemia 06/19/2014  . PVD (peripheral vascular disease) (HCC) 12/13/2013  . CAD in native artery 07/02/2013  . Essential hypertension 07/02/2013  . Diabetes mellitus type II, uncontrolled (HCC) 07/02/2013  . GERD (gastroesophageal reflux disease) 07/02/2013    Past Surgical History:  Procedure Laterality Date  . ABDOMINAL HYSTERECTOMY  1985  . APPENDECTOMY    . CATARACT EXTRACTION Left 02/20/2018   Dr. Harlon FlorWhitaker  . CORONARY STENT PLACEMENT  2015  . ectopic pregnancy--unilateral salpingectomy  1980  . KNEE ARTHROSCOPY Left 03/2016  . unilateral oophorectomy      OB History   No obstetric history on file.      Home Medications    Prior to Admission medications    Medication Sig Start Date End Date Taking? Authorizing Provider  amLODipine (NORVASC) 5 MG tablet TAKE 1 TABLET(5 MG) BY MOUTH DAILY 01/05/18   Lyn RecordsSmith, Henry W, MD  aspirin 81 MG tablet Take 81 mg by mouth daily.    [provider]  atorvastatin (LIPITOR) 20 MG tablet Take 1 tablet by mouth on Monday, Wednesday, Friday Patient not taking: Reported on 08/14/2018 06/30/18   [name], [name], MD  Cholecalciferol (VITAMIN D3) 2000 units TABS Take 1 tablet by mouth daily.    [provider]  clopidogrel (PLAVIX) 75 MG tablet TAKE 1 TABLET(75 MG) BY MOUTH DAILY 03/10/18   Lyn RecordsSmith, Henry W, MD  colchicine 0.6 MG tablet Take 1 tablet (0.6 mg total) by mouth daily. 06/30/18   [name], [name], MD  dexlansoprazole (DEXILANT) 60 MG capsule Take 1 capsule (60 mg total) by mouth daily. 05/22/18   [name], [name], MD  fluticasone [facility](FLONASE) 50 MCG/ACT nasal spray Place 1 spray into both nostrils daily as needed for allergies or rhinitis. 04/04/18   [name], [name], MD  Insulin Pen Needle (BD PEN NEEDLE NANO U/F) 32G X 4 MM MISC USE WITH INSULIN PEN AS DIRECTED 08/14/18   [name], [name], MD  metFORMIN (GLUCOPHAGE-XR) 500 MG 24 hr tablet 1 tablet by mouth daily with a meal 06/29/18   [name], [name], MD  metoprolol  succinate (TOPROL-XL) 100 MG 24 hr tablet TAKE 1 TABLET BY MOUTH EVERY DAY 07/17/18   [name], [name], MD  nitroGLYCERIN (NITROSTAT) 0.4 MG SL tablet Place 1 tablet (0.4 mg total) under the tongue every 5 (five) minutes as needed for chest pain. 05/16/18   Leone BrandIngold, Laura R, NP  NOVOLOG MIX 70/30 FLEXPEN (70-30) 100 UNIT/ML FlexPen INJECT 40 UNITS IN THE SKIN IN THE MORNING AND 45 UNITS IN THE EVENING AS DIRECTED 02/13/18   [name], [name], MD  olmesartan (BENICAR) 40 MG tablet Take 1 tablet (40 mg total) by mouth daily. 05/22/18   [name], [name], MD  OZEMPIC, 0.25 OR 0.5 MG/DOSE, 2 MG/1.5ML SOPN INJECT 0.5 MG SUBCUTANEOUS ONCE WEEKLY INTO THE ABDOMEN, THIGHS OR UPPER ARM, ROTATING INJECTION SITES 08/15/18   [name],  [name], MD  Semaglutide,0.25 or 0.5MG /DOS, (OZEMPIC, 0.25 OR 0.5 MG/DOSE,) 2 MG/1.5ML SOPN Inject 0.5 mg into the skin once a week. 08/14/18   [name], [name], MD  spironolactone-hydrochlorothiazide (ALDACTAZIDE) 25-25 MG tablet TAKE 1 TABLET BY MOUTH DAILY 01/05/18   Lyn RecordsSmith, Henry W, MD  traMADol (ULTRAM) 50 MG tablet Take 1 tablet (50 mg total) by mouth every 6 (six) hours as needed. 08/24/18   Eustace MooreNelson, Jacolyn Joaquin Sue, MD  allopurinol (ZYLOPRIM) 100 MG tablet Take 1 tablet (100 mg total) by mouth daily. Patient not taking: Reported on 08/14/2018 07/05/18 08/24/18  [name], [name], MD    Family History Family History  Problem Relation Age of Onset  . Aortic stenosis Daughter   . Heart disease Daughter        before age 860  . Hypertension Mother   . Cancer Father   . Hypertension Father   . Cancer Sister   . Breast cancer Neg Hx     Social History Social History   Tobacco Use  . Smoking status: Former Games developermoker  . Smokeless tobacco: Never Used  Substance Use Topics  . Alcohol use: Yes    Alcohol/week: 0.0 standard drinks    Comment: occ  . Drug use: No     Allergies   Dust mite extract, Other, and Pollen extract   Review of Systems Review of Systems  Constitutional: Negative for chills and fever.  HENT: Negative for ear pain and sore throat.   Eyes: Negative for pain and visual disturbance.  Respiratory: Negative for cough and shortness of breath.   Cardiovascular: Negative for chest pain and palpitations.  Gastrointestinal: Negative for abdominal pain and vomiting.  Genitourinary: Negative for dysuria and hematuria.  Musculoskeletal: Positive for arthralgias. Negative for back pain.  Skin: Negative for color change and rash.  Neurological: Negative for seizures and syncope.  All other systems reviewed and are negative.    Physical Exam Triage Vital Signs ED Triage Vitals  Enc Vitals Group     BP 08/24/18 0902 (!) 147/78     Pulse Rate 08/24/18 0902 66     Resp 08/24/18 0902  16     Temp 08/24/18 0902 99 F (37.2 C)     Temp src --      SpO2 08/24/18 0902 100 %     Weight --      Height --      Head Circumference --      Peak Flow --      Pain Score 08/24/18 0903 9     Pain Loc --      Pain Edu? --      Excl. in GC? --    No data found.  Updated  Vital Signs BP (!) 147/78   Pulse 66   Temp 99 F (37.2 C)   Resp 16   SpO2 100%       Physical Exam Constitutional:      General: She is not in acute distress.    Appearance: She is well-developed and normal weight.  HENT:     Head: Normocephalic and atraumatic.  Eyes:     Conjunctiva/sclera: Conjunctivae normal.     Pupils: Pupils are equal, round, and reactive to light.  Neck:     Musculoskeletal: Normal range of motion.  Cardiovascular:     Rate and Rhythm: Normal rate.  Pulmonary:     Effort: Pulmonary effort is normal. No respiratory distress.  Abdominal:     General: There is no distension.     Palpations: Abdomen is soft.  Musculoskeletal: Normal range of motion.     Comments: Patient cradles right arm close to body.  No swelling or bruising is identified.  There is tenderness over the proximal humerus and very limited range of motion of shoulder.  No tenderness over distal humerus or elbow.  Tenderness again over the mid ulna, mid forearm region.  No tenderness over wrist with good range of motion of wrist, normal strength and sensation of hand and fingers.  Skin:    General: Skin is warm and dry.  Neurological:     Mental Status: She is alert.      UC Treatments / Results  Labs (all labs ordered are listed, but only abnormal results are displayed) Labs Reviewed - No data to display  EKG None  Radiology Dg Shoulder Right  Result Date: 08/24/2018 CLINICAL DATA:  Pain. EXAM: RIGHT SHOULDER - 2+ VIEW COMPARISON:  None. FINDINGS: Degenerative changes.  No fractures or dislocations. IMPRESSION: Degenerative changes.  No fracture or dislocation. Electronically Signed   By: Gerome Samavid   Williams III M.D   On: 08/24/2018 10:21   Dg Forearm Right  Result Date: 08/24/2018 CLINICAL DATA:  Pain after fall EXAM: RIGHT FOREARM - 2 VIEW COMPARISON:  None. FINDINGS: Soft tissue swelling is seen identified. No fractures or dislocations. No joint effusion in the elbow. IMPRESSION: Soft tissue swelling.  No fracture noted. Electronically Signed   By: Gerome Samavid  Williams III M.D   On: 08/24/2018 10:22    Procedures Procedures (including critical care time)  Medications Ordered in UC Medications - No data to display  Initial Impression / Assessment and Plan / UC Course  I have reviewed the triage vital signs and the nursing notes.  Pertinent labs & imaging results that were available during my care of the patient were reviewed by me and considered in my medical decision making (see chart for details).      Final Clinical Impressions(s) / UC Diagnoses   Final diagnoses:  Fall, initial encounter  Right arm pain     Discharge Instructions     Wear sling for 2 to 3 days Be sure to do pendulum exercises a couple times a day and to maintain motion in the shoulder Ice to painful areas to reduce pain and swelling Take tramadol as needed for severe pain Call your PCP if not improving by Monday    ED Prescriptions    Medication Sig Dispense Auth. Provider   traMADol (ULTRAM) 50 MG tablet Take 1 tablet (50 mg total) by mouth every 6 (six) hours as needed. 15 tablet Eustace MooreNelson, Maahi Lannan Sue, MD     Controlled Substance Prescriptions [place] Controlled Substance Registry consulted? Yes  Raylene Everts, MD 08/24/18 [phone]

## 2018-09-11 ENCOUNTER — Telehealth: Payer: Self-pay

## 2018-09-11 MED ORDER — OZEMPIC (1 MG/DOSE) 2 MG/1.5ML ~~LOC~~ SOPN: 1.0000 mg | PEN_INJECTOR | SUBCUTANEOUS | 1 refills | Status: DC

## 2018-09-11 NOTE — Telephone Encounter (Signed)
The pt was notified that the 1mg  of Ozempic pen has been faxed to her pharmacy.  The pt said to let Dr. Baird Cancer know that she isn't going to take the allopurinol because it caused one of her cousins to have skin peeling and that the cousin ended up dying.

## 2018-09-11 NOTE — Telephone Encounter (Signed)
Does she want to see gout specialist for other treatment options?

## 2018-09-15 ENCOUNTER — Other Ambulatory Visit: Payer: Self-pay | Admitting: Interventional Cardiology

## 2018-09-15 ENCOUNTER — Telehealth: Payer: Self-pay

## 2018-09-15 MED ORDER — CLOPIDOGREL BISULFATE 75 MG PO TABS: ORAL_TABLET | ORAL | 0 refills | Status: DC

## 2018-09-15 NOTE — Telephone Encounter (Signed)
Pt's medication was sent to pt's pharmacy as requested. Confirmation received.  °

## 2018-09-15 NOTE — Telephone Encounter (Signed)
I left the pt a message to call the office back.  Does she want to see gout specialist for other treatment options?

## 2018-09-18 ENCOUNTER — Other Ambulatory Visit: Payer: Self-pay | Admitting: Internal Medicine

## 2018-09-18 ENCOUNTER — Telehealth: Payer: Self-pay

## 2018-09-18 NOTE — Telephone Encounter (Signed)
Does she want to see gout specialist for other treatment options?  The pt said yes that she will go to a gout specialist if Dr. Baird Cancer thinks that she needed to.

## 2018-09-19 ENCOUNTER — Other Ambulatory Visit: Payer: Self-pay

## 2018-09-19 DIAGNOSIS — M1A3490 Chronic gout due to renal impairment, unspecified hand, without tophus (tophi): Secondary | ICD-10-CM

## 2018-09-28 ENCOUNTER — Other Ambulatory Visit: Payer: Self-pay

## 2018-09-28 ENCOUNTER — Ambulatory Visit (INDEPENDENT_AMBULATORY_CARE_PROVIDER_SITE_OTHER): Payer: BC Managed Care – PPO | Admitting: Internal Medicine

## 2018-09-28 ENCOUNTER — Encounter: Payer: Self-pay | Admitting: Internal Medicine

## 2018-09-28 VITALS — BP 130/74 | HR 72 | Temp 98.8°F | Ht 63.6 in | Wt 204.2 lb

## 2018-09-28 DIAGNOSIS — W109XXA Fall (on) (from) unspecified stairs and steps, initial encounter: Secondary | ICD-10-CM

## 2018-09-28 DIAGNOSIS — I1310 Hypertensive heart and chronic kidney disease without heart failure, with stage 1 through stage 4 chronic kidney disease, or unspecified chronic kidney disease: Secondary | ICD-10-CM

## 2018-09-28 DIAGNOSIS — R0683 Snoring: Secondary | ICD-10-CM

## 2018-09-28 DIAGNOSIS — S46011A Strain of muscle(s) and tendon(s) of the rotator cuff of right shoulder, initial encounter: Secondary | ICD-10-CM

## 2018-09-28 DIAGNOSIS — N182 Chronic kidney disease, stage 2 (mild): Secondary | ICD-10-CM

## 2018-09-28 DIAGNOSIS — E1122 Type 2 diabetes mellitus with diabetic chronic kidney disease: Secondary | ICD-10-CM | POA: Diagnosis not present

## 2018-09-28 DIAGNOSIS — Z794 Long term (current) use of insulin: Secondary | ICD-10-CM

## 2018-09-28 MED ORDER — HYDROCODONE-ACETAMINOPHEN 5-325 MG PO TABS: 1.0000 | ORAL_TABLET | Freq: Four times a day (QID) | ORAL | 0 refills | Status: DC | PRN

## 2018-09-28 NOTE — Patient Instructions (Signed)
Rotator Cuff Tear  A rotator cuff tear is a partial or complete tear of the cord-like bands (tendons) that connect muscle to bone in the rotator cuff. The rotator cuff is a group of muscles and tendons that surround the shoulder joint and keep the upper arm bone (humerus) in the shoulder socket. The tear can occur suddenly (acute tear) or can develop over a long period of time (chronic tear). What are the causes? Acute tears may be caused by:  A fall, especially on an outstretched arm.  Lifting very heavy objects with a jerking motion. Chronic tears may be caused by overuse of the muscles. This may happen in sports, physical work, or activities in which your arm repeatedly moves over your head. What increases the risk? This condition is more likely to occur in:  Athletes and workers who frequently use their shoulder or reach over their heads. This may include activities such as: ? Tennis. ? Baseball and softball. ? Swimming and rowing. ? Weightlifting. ? Construction work. ? Painting.  People who smoke.  Older people who have arthritis or poor blood supply. These can make the muscles and tendons weaker. What are the signs or symptoms? Symptoms of this condition depend on the type and severity of the injury:  An acute tear may include a sudden tearing feeling, followed by severe pain that goes from your upper shoulder, down your arm, and toward your elbow.  A chronic tear includes a gradual weakness and decreased shoulder motion as the pain gets worse. The pain is usually worse at night. Both types may have symptoms such as:  Pain that spreads (radiates) from the shoulder to the upper arm.  Swelling and tenderness in front of the shoulder.  Decreased range of motion.  Pain when: ? Reaching, pulling, or lifting the arm above the head. ? Lowering the arm from above the head.  Not being able to raise your arm out to the side.  Difficulty placing the arm behind your back. How  is this diagnosed? This condition is diagnosed with a medical history and physical exam. Imaging tests may also be done, including:  X-rays.  MRI.  Ultrasound.  CT or MR arthrogram. During this test, a contrast material is injected into your shoulder and then images are taken. How is this treated? Treatment for this condition depends on the type and severity of the condition. In less severe cases, treatment may include:  Rest. This may be done with a sling that holds the shoulder still (immobilization). Your health care provider may also recommend avoiding activities that involve lifting your arm over your head.  Icing the shoulder.  Anti-inflammatory medicines, such as aspirin or ibuprofen.  Strengthening and stretching exercises. Your health care provider may recommend specific exercises to improve your range of motion and strengthen your shoulder. In more severe cases, treatment may include:  Physical therapy.  Steroid injections.  Surgery. Follow these instructions at home: Managing pain, stiffness, and swelling  If directed, put ice on the injured area. ? If you have a removable sling, remove it as told by your health care provider. ? Put ice in a plastic bag. ? Place a towel between your skin and the bag. ? Leave the ice on for 20 minutes, 2-3 times a day.  Raise (elevate) the injured area above the level of your heart while you are lying down.  Find a comfortable sleeping position or sleep on a recliner, if available.  Move your fingers often to avoid stiffness   and to lessen swelling.  Once the swelling has gone down, your health care provider may direct you to apply heat to relax the muscles. Use the heat source that your health care provider recommends, such as a moist heat pack or a heating pad. ? Place a towel between your skin and the heat source. ? Leave the heat on for 20-30 minutes. ? Remove the heat if your skin turns bright red. This is especially  important if you are unable to feel pain, heat, or cold. You may have a greater risk of getting burned. If you have a sling:  Wear the sling as told by your health care provider. Remove it only as told by your health care provider.  Loosen the sling if your fingers tingle, become numb, or turn cold and blue.  Keep the sling clean.  If the sling is not waterproof: ? Do not let it get wet. ? Cover it with a watertight covering when you take a bath or a shower. Driving  Do not drive or use heavy machinery while taking prescription pain medicine.  Ask your health care provider when it is safe to drive if you have a sling on your arm. Activity  Rest your shoulder as told by your health care provider.  Return to your normal activities as told by your health care provider. Ask your health care provider what activities are safe for you.  Do any exercises or stretches as told by your health care provider. General instructions  Do not use any products that contain nicotine or tobacco, such as cigarettes and e-cigarettes. If you need help quitting, ask your health care provider.  Take over-the-counter and prescription medicines only as told by your health care provider.  Keep all follow-up visits as told by your health care provider. This is important. Contact a health care provider if:  Your pain gets worse.  You have new pain in your arm, hands, or fingers.  Medicine does not help your pain. Get help right away if:  Your arm, hand, or fingers are numb or tingling.  Your arm, hand, or fingers are swollen or painful or they turn white or blue.  Your hand or fingers on your injured arm are colder than your other hand. Summary  A rotator cuff tear is a partial or complete tear of the cord-like bands (tendons) that connect muscle to bone in the rotator cuff.  The tear can occur suddenly (acute tear) or can develop over a long period of time (chronic tear).  Treatment generally  includes rest, anti-inflammatory medicines, and icing. In some cases, physical therapy and steroid injections may be needed. In severe cases, surgery may be needed. This information is not intended to replace advice given to you by your health care provider. Make sure you discuss any questions you have with your health care provider. Document Released: 02/20/2000 Document Revised: 02/04/2017 Document Reviewed: 05/10/2016 Elsevier Patient Education  2020 [employer]  

## 2018-09-29 LAB — BMP8+EGFR
BUN/Creatinine Ratio: 15 (ref 12–28)
BUN: 17 mg/dL (ref 8–27)
CO2: 22 mmol/L (ref 20–29)
Calcium: 9.7 mg/dL (ref 8.7–10.3)
Chloride: 102 mmol/L (ref 96–106)
Creatinine, Ser: 1.16 mg/dL — ABNORMAL HIGH (ref 0.57–1.00)
GFR calc Af Amer: 54 mL/min/{1.73_m2} — ABNORMAL LOW (ref >59)
GFR calc non Af Amer: 47 mL/min/{1.73_m2} — ABNORMAL LOW (ref >59)
Glucose: 79 mg/dL (ref 65–99)
Potassium: 4.1 mmol/L (ref 3.5–5.2)
Sodium: 142 mmol/L (ref 134–144)

## 2018-09-29 LAB — HEMOGLOBIN A1C
Est. average glucose Bld gHb Est-mCnc: 140 mg/dL
Hgb A1c MFr Bld: 6.5 % — ABNORMAL HIGH (ref 4.8–5.6)

## 2018-09-30 NOTE — Progress Notes (Signed)
Subjective:     Patient ID: Dawn Wood , female    DOB: 1945-11-15 , 73 y.o.   MRN: [id number]   Chief Complaint  Patient presents with  . Diabetes  . Arm Pain    right  . Hypertension    HPI  Diabetes She presents for her follow-up diabetic visit. She has type 2 diabetes mellitus. Her disease course has been stable. There are no hypoglycemic associated symptoms. Pertinent negatives for diabetes include no blurred vision and no chest pain. There are no hypoglycemic complications. Diabetic complications include nephropathy. Risk factors for coronary artery disease include diabetes mellitus, dyslipidemia, hypertension, obesity, sedentary lifestyle and post-menopausal. Current diabetic treatment includes insulin injections. She is following a diabetic diet. She participates in exercise intermittently. Her home blood glucose trend is fluctuating minimally. Her breakfast blood glucose is taken between 8-9 am. Her breakfast blood glucose range is generally 110-130 mg/dl. Eye exam is current.  Arm Pain  The incident occurred more than 1 week ago. The incident occurred at home. The injury mechanism was a fall. The pain is present in the right shoulder. The quality of the pain is described as aching. The pain is at a severity of 6/10. The pain is moderate. The pain has been constant since the incident. Pertinent negatives include no chest pain. The symptoms are aggravated by movement and lifting. She has tried acetaminophen and NSAIDs for the symptoms. The treatment provided no relief.  Hypertension This is a chronic problem. The current episode started more than 1 year ago. The problem has been gradually improving since onset. Pertinent negatives include no blurred vision, chest pain, palpitations or shortness of breath. Risk factors for coronary artery disease include diabetes mellitus, dyslipidemia, sedentary lifestyle and post-menopausal state. Hypertensive end-organ damage includes kidney  disease.     Past Medical History:  Diagnosis Date  . Coronary artery disease   . Diabetes mellitus without complication ([place])   . GERD (gastroesophageal reflux disease)   . Hypertension   . MI (myocardial infarction) ([place])   . Peripheral vascular disease ([place])      Family History  Problem Relation Age of Onset  . Aortic stenosis Daughter   . Heart disease Daughter        before age 41  . Hypertension Mother   . Cancer Father   . Hypertension Father   . Cancer Sister   . Breast cancer Neg Hx      Current Outpatient Medications:  .  aspirin 81 MG tablet, Take 81 mg by mouth daily., Disp: , Rfl:  .  atorvastatin (LIPITOR) 20 MG tablet, Take 1 tablet by mouth on Monday, Wednesday, Friday, Disp: 90 tablet, Rfl: 1 .  Cholecalciferol (VITAMIN D3) 2000 units TABS, Take 1 tablet by mouth daily., Disp: , Rfl:  .  clopidogrel (PLAVIX) 75 MG tablet, TAKE 1 TABLET(75 MG) BY MOUTH DAILY. Please keep upcoming appt with Dr. Tamala Julian in September before anymore refills. Thank you, Disp: 90 tablet, Rfl: 0 .  dexlansoprazole (DEXILANT) 60 MG capsule, Take 1 capsule (60 mg total) by mouth daily., Disp: 30 capsule, Rfl: 1 .  fluticasone (FLONASE) 50 MCG/ACT nasal spray, Place 1 spray into both nostrils daily as needed for allergies or rhinitis., Disp: 16 g, Rfl: 2 .  Insulin Pen Needle (BD PEN NEEDLE NANO U/F) 32G X 4 MM MISC, USE WITH INSULIN PEN AS DIRECTED TWICE DAILY, Disp: 100 each, Rfl: 2 .  metFORMIN (GLUCOPHAGE-XR) 500 MG 24 hr tablet, 1 tablet by  mouth daily with a meal, Disp: 90 tablet, Rfl: 1 .  metoprolol succinate (TOPROL-XL) 100 MG 24 hr tablet, TAKE 1 TABLET BY MOUTH EVERY DAY, Disp: 30 tablet, Rfl: 4 .  nitroGLYCERIN (NITROSTAT) 0.4 MG SL tablet, Place 1 tablet (0.4 mg total) under the tongue every 5 (five) minutes as needed for chest pain., Disp: 25 tablet, Rfl: 3 .  NOVOLOG MIX 70/30 FLEXPEN (70-30) 100 UNIT/ML FlexPen, INJECT 40 UNITS IN THE SKIN IN THE MORNING AND 45 UNITS IN THE  EVENING AS DIRECTED, Disp: 25 pen, Rfl: 2 .  olmesartan (BENICAR) 40 MG tablet, Take 1 tablet (40 mg total) by mouth daily., Disp: 90 tablet, Rfl: 2 .  Semaglutide, 1 MG/DOSE, (OZEMPIC, 1 MG/DOSE,) 2 MG/1.5ML SOPN, Inject 1 mg into the skin once a week., Disp: 3 pen, Rfl: 1 .  spironolactone-hydrochlorothiazide (ALDACTAZIDE) 25-25 MG tablet, TAKE 1 TABLET BY MOUTH DAILY, Disp: 30 tablet, Rfl: 8 .  traMADol (ULTRAM) 50 MG tablet, Take 1 tablet (50 mg total) by mouth every 6 (six) hours as needed., Disp: 15 tablet, Rfl: 0 .  amLODipine (NORVASC) 5 MG tablet, TAKE 1 TABLET(5 MG) BY MOUTH DAILY, Disp: 90 tablet, Rfl: 2 .  colchicine 0.6 MG tablet, Take 1 tablet (0.6 mg total) by mouth daily. (Patient not taking: Reported on 09/28/2018), Disp: 30 tablet, Rfl: 1 .  HYDROcodone-acetaminophen (NORCO/VICODIN) 5-325 MG tablet, Take 1 tablet by mouth every 6 (six) hours as needed for moderate pain., Disp: 30 tablet, Rfl: 0   Allergies  Allergen Reactions  . Dust Mite Extract Other (See Comments)    REACTION: SNEEZING  . Other Other (See Comments)    ALLERGEN: DEODORIZERS SPRAY AND PERFUMES REACTION: SNEEZING  . Pollen Extract Other (See Comments)    Reaction unknown     Review of Systems  Constitutional: Negative.   Eyes: Negative for blurred vision.  Respiratory: Negative.  Negative for shortness of breath.   Cardiovascular: Negative.  Negative for chest pain and palpitations.  Gastrointestinal: Negative.   Musculoskeletal:       She c/o R shoulder pain. She fell last month, missed a step, fell onto right side. She went to Urgent care and xrays were taken.  She reports she is still w/ arm pain;   Neurological: Negative.   Psychiatric/Behavioral: Negative.      Today's Vitals   09/28/18 1144  BP: 130/74  Pulse: 72  Temp: 98.8 F (37.1 C)  TempSrc: Oral  Weight: 204 lb 3.2 oz (92.6 kg)  Height: 5' 3.6" (1.615 m)  PainSc: 5   PainLoc: Shoulder   Body mass index is 35.49 kg/m.    Objective:  Physical Exam Vitals signs and nursing note reviewed.  Constitutional:      Appearance: Normal appearance.  HENT:     Head: Normocephalic and atraumatic.  Cardiovascular:     Rate and Rhythm: Normal rate and regular rhythm.     Heart sounds: Normal heart sounds.  Pulmonary:     Effort: Pulmonary effort is normal.     Breath sounds: Normal breath sounds.  Musculoskeletal:     Right shoulder: She exhibits bony tenderness.  Skin:    General: Skin is warm.  Neurological:     General: No focal deficit present.     Mental Status: She is alert.  Psychiatric:        Mood and Affect: Mood normal.        Behavior: Behavior normal.         Assessment  And Plan:     1. Type 2 diabetes mellitus with stage 2 chronic kidney disease, with long-term current use of insulin ([place])  I will check labs as listed below.  Importance of dietary compliance was discussed with the patient.   - Hemoglobin A1c - BMP8+EGFR  2. Traumatic tear of right rotator cuff, unspecified tear extent, initial encounter  Occurred after a fall. I will refer her to Ortho for further evaluation.  Previous MRI shoulder reviewed in full detail.   - Ambulatory referral to Orthopedic Surgery  3. Hypertensive heart and renal disease with renal failure, stage 1 through stage 4 or unspecified chronic kidney disease, without heart failure  Controlled. She will continue with current meds. She is encouraged to avoid adding salt to her foods.   4. Snoring  Chronic. I will refer her to Neuro for sleep study. She is in agreement with her treatment plan.   - Ambulatory referral to Neurology        [name], MD    THE PATIENT IS ENCOURAGED TO PRACTICE SOCIAL DISTANCING DUE TO THE COVID-19 PANDEMIC.

## 2018-10-04 ENCOUNTER — Other Ambulatory Visit: Payer: Self-pay | Admitting: Interventional Cardiology

## 2018-10-04 DIAGNOSIS — I10 Essential (primary) hypertension: Secondary | ICD-10-CM

## 2018-10-11 NOTE — Progress Notes (Deleted)
Office Visit Note  Patient: Dawn Wood             Date of Birth: [birth year]           MRN: [id number]             PCP: [name], [name], MD Referring: [name], [name], MD Visit Date: 10/25/2018 Occupation: @GUAROCC @  Subjective:  No chief complaint on file.   History of Present Illness: Dawn ShaveLaverne H Harps is a 73 y.o. female ***   Activities of Daily Living:  Patient reports morning stiffness for *** {minute/hour:19697}.   Patient {ACTIONS;DENIES/REPORTS:21021675::"Denies"} nocturnal pain.  Difficulty dressing/grooming: {ACTIONS;DENIES/REPORTS:21021675::"Denies"} Difficulty climbing stairs: {ACTIONS;DENIES/REPORTS:21021675::"Denies"} Difficulty getting out of chair: {ACTIONS;DENIES/REPORTS:21021675::"Denies"} Difficulty using hands for taps, buttons, cutlery, and/or writing: {ACTIONS;DENIES/REPORTS:21021675::"Denies"}  No Rheumatology ROS completed.   PMFS History:  Patient Active Problem List   Diagnosis Date Noted  . Gout 12/26/2017  . Dyslipidemia 06/19/2014  . PVD (peripheral vascular disease) ([facility]) 12/13/2013  . CAD in native artery 07/02/2013  . Essential hypertension 07/02/2013  . Diabetes mellitus type II, uncontrolled ([facility]) 07/02/2013  . GERD (gastroesophageal reflux disease) 07/02/2013    Past Medical History:  Diagnosis Date  . Coronary artery disease   . Diabetes mellitus without complication ([facility])   . GERD (gastroesophageal reflux disease)   . Hypertension   . MI (myocardial infarction) ([facility])   . Peripheral vascular disease ([facility])     Family History  Problem Relation Age of Onset  . Aortic stenosis Daughter   . Heart disease Daughter        before age 74  . Hypertension Mother   . Cancer Father   . Hypertension Father   . Cancer Sister   . Breast cancer Neg Hx    Past Surgical History:  Procedure Laterality Date  . ABDOMINAL HYSTERECTOMY  1985  . APPENDECTOMY    . CATARACT EXTRACTION Left 02/20/2018   Dr. Harlon FlorWhitaker  . CORONARY STENT  PLACEMENT  2015  . ectopic pregnancy--unilateral salpingectomy  1980  . KNEE ARTHROSCOPY Left 03/2016  . unilateral oophorectomy     Social History   Social History Narrative  . Not on file   Immunization History  Administered Date(s) Administered  . Tdap 11/04/2017     Objective: Vital Signs: There were no vitals taken for this visit.   Physical Exam   Musculoskeletal Exam: ***  CDAI Exam: CDAI Score: - Patient Global: -; Provider Global: - Swollen: -; Tender: - Joint Exam   No joint exam has been documented for this visit   There is currently no information documented on the homunculus. Go to the Rheumatology activity and complete the homunculus joint exam.  Investigation: No additional findings.  Imaging: No results found.  Recent Labs: Lab Results  Component Value Date   WBC 8.6 12/29/2017   HGB 14.0 12/29/2017   PLT 290 12/29/2017   NA 142 09/28/2018   K 4.1 09/28/2018   CL 102 09/28/2018   CO2 22 09/28/2018   GLUCOSE 79 09/28/2018   BUN 17 09/28/2018   CREATININE 1.16 (H) 09/28/2018   BILITOT 0.5 06/29/2018   ALKPHOS 84 06/29/2018   AST 15 06/29/2018   ALT 16 06/29/2018   PROT 7.5 06/29/2018   ALBUMIN 4.2 06/29/2018   CALCIUM 9.7 09/28/2018   GFRAA 54 (L) 09/28/2018    Speciality Comments: No specialty comments available.  Procedures:  No procedures performed Allergies: Dust mite extract, Other, and Pollen extract   Assessment / Plan:  Visit Diagnoses: No diagnosis found.  Orders: No orders of the defined types were placed in this encounter.  No orders of the defined types were placed in this encounter.   Face-to-face time spent with patient was *** minutes. Greater than 50% of time was spent in counseling and coordination of care.  Follow-Up Instructions: No follow-ups on file.   Ofilia Neas, PA-C  Note - This record has been created using Dragon software.  Chart creation errors have been sought, but may not always  have  been located. Such creation errors do not reflect on  the standard of medical care.

## 2018-10-14 ENCOUNTER — Other Ambulatory Visit: Payer: Self-pay | Admitting: Interventional Cardiology

## 2018-10-16 ENCOUNTER — Telehealth: Payer: Self-pay | Admitting: Interventional Cardiology

## 2018-10-16 ENCOUNTER — Other Ambulatory Visit: Payer: Self-pay | Admitting: Interventional Cardiology

## 2018-10-16 NOTE — Telephone Encounter (Signed)
amLODipine (NORVASC) 5 MG tablet Medication Date: 10/16/2018 Department: [facility] Office Ordering/Authorizing: Belva Crome, MD  Order Providers  Prescribing Provider Encounter Provider  Belva Crome, MD Belva Crome, MD  Outpatient Medication Detail   Disp Refills Start End   amLODipine (NORVASC) 5 MG tablet 90 tablet 0 10/16/2018    Sig - Route: Take 1 tablet (5 mg total) by mouth daily. Please keep upcoming appointment for further refills - Oral   Sent to pharmacy as: amLODipine (NORVASC) 5 MG tablet   E-Prescribing Status: Receipt confirmed by pharmacy (10/16/2018 2:41 PM EDT)   [place], [place]

## 2018-10-16 NOTE — Telephone Encounter (Signed)
°*  STAT* If patient is at the pharmacy, call can be transferred to refill team.   1. Which medications need to be refilled? (please list name of each medication and dose if known) Amlodipine 5 mg  2. Which pharmacy/location (including street and city if local pharmacy) is medication to be sent to? [facility] [employer] [facility]. Do they need a 30 day or 90 day supply? [place]

## 2018-10-16 NOTE — Telephone Encounter (Signed)
Patient called back and was a bit upset as she was told when she called this morning to contact the pharmacy within one hour and her medication should be refilled as requested. She says that she just went there and the pharmacy did not have it. I apologized and informed her that the person that she spoke with earlier today is not authorized to refill medications but that I would get this sent in for her. I verified the pharmacy with her and she says that this needs to be sent to [facility] on [address] not [facility]

## 2018-10-25 ENCOUNTER — Ambulatory Visit: Payer: BC Managed Care – PPO | Admitting: Rheumatology

## 2018-11-15 NOTE — Progress Notes (Signed)
Office Visit Note  Patient: Dawn Wood             Date of Birth: 10-13-1945           MRN: [id number]             PCP: Glendale Chard, MD Referring: Glendale Chard, MD Visit Date: 11/29/2018 Occupation: Ralene [employer] at [employer]  Subjective:  Joint pain and joint swelling.   History of Present Illness: Dawn Wood is a 73 y.o. female seen in consultation per request of Dr. Baird Cancer.  According to patient last year she started having left foot swelling.  At the time she was seen by her cardiologist for a regular visit and he placed her on colchicine.  She states the colchicine helped her symptoms and then she stopped the colchicine.  She went for a follow-up visit to see Dr. Baird Cancer and discussed the use of colchicine.  Dr. Baird Cancer agreed with the diagnosis of gout and advised taking colchicine on a daily basis.  Patient states she discussed this with the pharmacist and decided not to take the colchicine on a daily basis and has been taking only on PRN basis.  Dr. Baird Cancer also advised allopurinol due to gout but patient was hesitant to take it as she lost 1 of her cousin who developed a reaction to allopurinol and died due to skin complications and reaction.  She states she continues to have frequent gout flares since fact she is always in a flare.  She has ongoing swelling in her ankles.  She is also noticed some knots on her fingers which she is concerned if they are component of gout.  Activities of Daily Living:  Patient reports morning stiffness for 0 minutes.   Patient Reports nocturnal pain.  Difficulty dressing/grooming: Denies Difficulty climbing stairs: Reports Difficulty getting out of chair: Reports Difficulty using hands for taps, buttons, cutlery, and/or writing: Denies  Review of Systems  Constitutional: Positive for fatigue. Negative for night sweats, weight gain and weight loss.  HENT: Negative for mouth sores, trouble swallowing, trouble swallowing, mouth  dryness and nose dryness.   Eyes: Negative for pain, redness, itching, visual disturbance and dryness.  Respiratory: Negative for cough, shortness of breath, wheezing and difficulty breathing.   Cardiovascular: Negative for chest pain, palpitations, hypertension, irregular heartbeat and swelling in legs/feet.  Gastrointestinal: Positive for constipation and diarrhea. Negative for blood in stool.  Endocrine: Negative for increased urination.  Genitourinary: Negative for difficulty urinating, painful urination and vaginal dryness.  Musculoskeletal: Positive for arthralgias, joint pain and joint swelling. Negative for myalgias, muscle weakness, morning stiffness, muscle tenderness and myalgias.  Skin: Negative for color change, rash, hair loss, skin tightness, ulcers and sensitivity to sunlight.  Allergic/Immunologic: Negative for susceptible to infections.  Neurological: Positive for numbness and weakness. Negative for dizziness, headaches, memory loss and night sweats.  Hematological: Negative for swollen glands.  Psychiatric/Behavioral: Negative for depressed mood, confusion and sleep disturbance. The patient is not nervous/anxious.     PMFS History:  Patient Active Problem List   Diagnosis Date Noted  . Gout 12/26/2017  . Dyslipidemia 06/19/2014  . PVD (peripheral vascular disease) ([place]) 12/13/2013  . CAD in native artery 07/02/2013  . Essential hypertension 07/02/2013  . Diabetes mellitus type II, uncontrolled ([place]) 07/02/2013  . GERD (gastroesophageal reflux disease) 07/02/2013    Past Medical History:  Diagnosis Date  . Coronary artery disease   . Diabetes mellitus without complication ([place])   . GERD (gastroesophageal reflux  disease)   . Hypertension   . MI (myocardial infarction) ([place])   . Peripheral vascular disease ([place])     Family History  Problem Relation Age of Onset  . Aortic stenosis Daughter   . Heart disease Daughter        before age 63  . Hypertension Mother    . Cancer Father   . Hypertension Father   . Thyroid disease Daughter   . Breast cancer Neg Hx    Past Surgical History:  Procedure Laterality Date  . ABDOMINAL HYSTERECTOMY  1985  . APPENDECTOMY    . CATARACT EXTRACTION Left 02/20/2018   Dr. Venetia Maxon  . CORONARY STENT PLACEMENT  2015  . ectopic pregnancy--unilateral salpingectomy  1980  . KNEE ARTHROSCOPY Left 03/2016  . unilateral oophorectomy     Social History   Social History Narrative  . Not on file   Immunization History  Administered Date(s) Administered  . Tdap 11/04/2017     Objective: Vital Signs: BP (!) 144/76 (BP Location: Left Arm, Patient Position: Sitting, Cuff Size: Large)   Pulse 70   Resp 15   Ht '5\' 5"'  (1.651 m)   Wt 206 lb (93.4 kg)   BMI 34.28 kg/m    Physical Exam Vitals signs and nursing note reviewed.  Constitutional:      Appearance: She is well-developed.  HENT:     Head: Normocephalic and atraumatic.  Eyes:     Conjunctiva/sclera: Conjunctivae normal.  Neck:     Musculoskeletal: Normal range of motion.  Cardiovascular:     Rate and Rhythm: Normal rate and regular rhythm.     Heart sounds: Normal heart sounds.  Pulmonary:     Effort: Pulmonary effort is normal.     Breath sounds: Normal breath sounds.  Abdominal:     General: Bowel sounds are normal.     Palpations: Abdomen is soft.  Lymphadenopathy:     Cervical: No cervical adenopathy.  Skin:    General: Skin is warm and dry.     Capillary Refill: Capillary refill takes less than 2 seconds.  Neurological:     Mental Status: She is alert and oriented to person, place, and time.  Psychiatric:        Behavior: Behavior normal.      Musculoskeletal Exam: C-spine was in good range of motion.  Shoulder joints and elbow joints with good range of motion.  Wrist joints with good range of motion.  She has no MCP swelling.  PIP and DIP thickening and CMC thickening was noted consistent with osteoarthritis.  She had a mucinous cyst  present on her right first PIP.  Hip joints and knee joints are in good range of motion.  She has warmth and swelling over bilateral ankle joints.  She has tenderness across her MTPs but no synovitis was noted.  CDAI Exam: CDAI Score: - Patient Global: -; Provider Global: - Swollen: -; Tender: - Joint Exam   No joint exam has been documented for this visit   There is currently no information documented on the homunculus. Go to the Rheumatology activity and complete the homunculus joint exam.  Investigation: No additional findings.  Imaging: Xr Ankle 2 Views Left  Result Date: 11/29/2018 No tibiotalar or subtalar joint space narrowing was noted.  No erosive changes were noted.  Soft tissue swelling was noted. Impression: Unremarkable x-ray of the ankle joint.  Xr Ankle 2 Views Right  Result Date: 11/29/2018 No tibiotalar or subtalar joint space narrowing was  noted.  No erosive changes were noted.  Soft tissue swelling was noted. Impression: Unremarkable x-ray of the ankle joint.  Xr Foot 2 Views Left  Result Date: 11/29/2018 First MTP, PIP and DIP narrowing was noted.  Some cystic versus erosive changes were noted in the first MTP joint.  Dorsal spurring was noted.  No significant intertarsal or tibiotalar joint space narrowing was noted. Impression: These findings are consistent with osteoarthritis of the foot and possible gouty arthropathy.  Xr Foot 2 Views Right  Result Date: 11/29/2018 First MTP narrowing with erosive changes were noted.  PIP and DIP narrowing was noted.  No significant intertarsal joint space narrowing was noted.  Dorsal spurring was noted.  A small posterior calcaneal spur was noted. Impression: These findings are consistent with osteoarthritis of the foot and possible gouty arthropathy.  Xr Hand 2 View Left  Result Date: 11/29/2018 PIP and DIP and CMC narrowing was noted.  Second and third MCP joint narrowing was noted.  No erosive changes were noted.  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  Xr Hand 2 View Right  Result Date: 11/29/2018 Severe CMC narrowing was noted.  Second and third MCP narrowing and PIP and DIP narrowing was noted.  No intercarpal radiocarpal joint space narrowing was noted.  No erosive changes were noted. 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   Recent Labs: Lab Results  Component Value Date   WBC 8.6 12/29/2017   HGB 14.0 12/29/2017   PLT 290 12/29/2017   NA 142 09/28/2018   K 4.1 09/28/2018   CL 102 09/28/2018   CO2 22 09/28/2018   GLUCOSE 79 09/28/2018   BUN 17 09/28/2018   CREATININE 1.16 (H) 09/28/2018   BILITOT 0.5 06/29/2018   ALKPHOS 84 06/29/2018   AST 15 06/29/2018   ALT 16 06/29/2018   PROT 7.5 06/29/2018   ALBUMIN 4.2 06/29/2018   CALCIUM 9.7 09/28/2018   GFRAA 54 (L) 09/28/2018  June 29, 2018 uric acid 8.4  Speciality Comments: No specialty comments available.  Procedures:  No procedures performed Allergies: Dust mite extract, Other, and Pollen extract   Assessment / Plan:     Visit Diagnoses: Idiopathic chronic gout without tophus-patient gets history of recurrent gout episodes for the last 1 year.  Patient states that the episodes used to be more infrequent but now they have become persistent.  She continues to have pain and swelling in her bilateral ankles.  She has intermittent swelling in her toes.  She was given colchicine which was effective but she was hesitant to take it on a regular basis as she is on statins as well.  She may benefit from taking colchicine every other day.  She was given a handout on colchicine and side effects are discussed.  She is concerned about using allopurinol as her cousin had reaction to allopurinol and she passed away.  We had detailed discussion regarding allopurinol today.  We will obtain HLA B 5801.  Handout on allopurinol was given.   We also discussed the option of using Uloric instead of allopurinol.  It does come with a black box warning of increased risk of CVD.  Although this study was not very strong which showed these findings.  She was also given information on allopurinol and Uloric so she can make an informed decision at the follow-up visit.  06/29/18: Uric acid 8.4  Pain in both hands -clinical findings are consistent with osteoarthritis.  She also has a mucinous cyst on her right thumb.  Plan: XR Hand 2 View Right, XR Hand 2 View Left.  The x-rays were consistent with osteoarthritis.  She also had narrowing of the second and third MCP joint which can be seen in inflammatory arthritis.  We will obtain labs today.  Chronic pain of both ankles -she has swelling of bilateral ankles and some tenderness with range of motion.  Plan: XR Ankle 2 Views Right, XR Ankle 2 Views Left.  The ankle joint x-rays were unremarkable except for soft tissue swelling.  Pain in both feet -she has tenderness over right fifth MTP joint but no swelling was noted.  Plan: XR Foot 2 Views Right, XR Foot 2 Views Left.  The x-ray showed osteoarthritic changes and some erosive changes in the first MTP which could be consistent with gouty arthropathy.  CAD in native artery-she is on Plavix, followed by cardiology.  Dyslipidemia-treated with Lipitor  PVD (peripheral vascular disease) ([place])  Essential hypertension  History of gastroesophageal reflux (GERD)  History of type 2 diabetes mellitus  Orders: Orders Placed This Encounter  Procedures  . XR Hand 2 View Right  . XR Hand 2 View Left  . XR Ankle 2 Views Right  . XR Ankle 2 Views Left  . XR Foot 2 Views Right  . XR Foot 2 Views Left  . COMPLETE METABOLIC PANEL WITH GFR  . Sedimentation rate  . Rheumatoid factor  . Cyclic citrul peptide antibody, IgG  . Uric acid  . HLA-B*58:01 Typing   No orders of the defined types were placed in this encounter.   Face-to-face time spent with  patient was 45 minutes. Greater than 50% of time was spent in counseling and coordination of care.  Follow-Up Instructions: Return for Gout, Osteoarthritis.   Bo Merino, MD  Note - This record has been created using Editor, commissioning.  Chart creation errors have been sought, but may not always  have been located. Such creation errors do not reflect on  the standard of medical care.

## 2018-11-29 ENCOUNTER — Ambulatory Visit: Payer: Self-pay

## 2018-11-29 ENCOUNTER — Ambulatory Visit: Payer: BC Managed Care – PPO | Admitting: Rheumatology

## 2018-11-29 ENCOUNTER — Other Ambulatory Visit: Payer: Self-pay

## 2018-11-29 ENCOUNTER — Encounter: Payer: Self-pay | Admitting: Rheumatology

## 2018-11-29 VITALS — BP 144/76 | HR 70 | Resp 15 | Ht 65.0 in | Wt 206.0 lb

## 2018-11-29 DIAGNOSIS — I10 Essential (primary) hypertension: Secondary | ICD-10-CM

## 2018-11-29 DIAGNOSIS — M25572 Pain in left ankle and joints of left foot: Secondary | ICD-10-CM

## 2018-11-29 DIAGNOSIS — M79641 Pain in right hand: Secondary | ICD-10-CM

## 2018-11-29 DIAGNOSIS — M25571 Pain in right ankle and joints of right foot: Secondary | ICD-10-CM

## 2018-11-29 DIAGNOSIS — M79642 Pain in left hand: Secondary | ICD-10-CM | POA: Diagnosis not present

## 2018-11-29 DIAGNOSIS — I2510 Atherosclerotic heart disease of native coronary artery without angina pectoris: Secondary | ICD-10-CM

## 2018-11-29 DIAGNOSIS — M79671 Pain in right foot: Secondary | ICD-10-CM | POA: Diagnosis not present

## 2018-11-29 DIAGNOSIS — Z8719 Personal history of other diseases of the digestive system: Secondary | ICD-10-CM

## 2018-11-29 DIAGNOSIS — I739 Peripheral vascular disease, unspecified: Secondary | ICD-10-CM

## 2018-11-29 DIAGNOSIS — E785 Hyperlipidemia, unspecified: Secondary | ICD-10-CM

## 2018-11-29 DIAGNOSIS — Z8639 Personal history of other endocrine, nutritional and metabolic disease: Secondary | ICD-10-CM

## 2018-11-29 DIAGNOSIS — G8929 Other chronic pain: Secondary | ICD-10-CM

## 2018-11-29 DIAGNOSIS — M1A09X0 Idiopathic chronic gout, multiple sites, without tophus (tophi): Secondary | ICD-10-CM | POA: Diagnosis not present

## 2018-11-29 DIAGNOSIS — M79672 Pain in left foot: Secondary | ICD-10-CM | POA: Diagnosis not present

## 2018-12-03 NOTE — Progress Notes (Signed)
Cardiology Office Note:    Date:  12/04/2018   ID:  Dawn, Wood [birth year], MRN [id number]  PCP:  Dawn Chard, MD  Cardiologist:  Dawn Grooms, MD   Referring MD: Dawn Chard, MD   Chief Complaint  Patient presents with  . Coronary Artery Disease    History of Present Illness:    Dawn Wood is a 73 y.o. female with a hx of CADWithprior LAD DEstent2015Dr. Kahn, hypertension, and hyperlipidemia.  Recently cleared for left knee arthroscopy which occurred off antiplatelet therapy without complications.  Dawn Wood is here for cardiology follow-up.  She continues to be bothered by "indigestion".  This occurs at rest and almost always while recumbent.  Sitting and using oral antacids improve the discomfort.  It can be quite intense and may last up to 15 minutes.  It is never precipitated by physical activity.  Relatively sedentary since the [place] pandemic began.  She has not needed to use sublingual nitroglycerin.  She denies orthopnea, PND, palpitations, and syncope.  No claudication.  Past Medical History:  Diagnosis Date  . Coronary artery disease   . Diabetes mellitus without complication ([place])   . GERD (gastroesophageal reflux disease)   . Hypertension   . MI (myocardial infarction) ([place])   . Peripheral vascular disease [facility])     Past Surgical History:  Procedure Laterality Date  . ABDOMINAL HYSTERECTOMY  1985  . APPENDECTOMY    . CATARACT EXTRACTION Left 02/20/2018   Dr. Venetia Wood  . CORONARY STENT PLACEMENT  2015  . ectopic pregnancy--unilateral salpingectomy  1980  . KNEE ARTHROSCOPY Left 03/2016  . unilateral oophorectomy      Current Medications: Current Meds  Medication Sig  . amLODipine (NORVASC) 5 MG tablet Take 1 tablet (5 mg total) by mouth daily. Please keep upcoming appointment for further refills  . aspirin 81 MG tablet Take 81 mg by mouth daily.  Marland Kitchen atorvastatin (LIPITOR) 20 MG tablet Take 1 tablet by mouth on Monday, Wednesday,  Friday  . atorvastatin (LIPITOR) 20 MG tablet Take 20 mg by mouth once a week.  . Cholecalciferol (VITAMIN D3) 2000 units TABS Take 1 tablet by mouth daily.  . clopidogrel (PLAVIX) 75 MG tablet TAKE 1 TABLET(75 MG) BY MOUTH DAILY. Please keep upcoming appt with Dr. Tamala Julian in September before anymore refills. Thank you  . colchicine 0.6 MG tablet Take 0.6 mg by mouth as needed.  Marland Kitchen dexlansoprazole (DEXILANT) 60 MG capsule Take 1 capsule (60 mg total) by mouth daily.  . fluticasone (FLONASE) 50 MCG/ACT nasal spray Place 1 spray into both nostrils daily as needed for allergies or rhinitis.  . Insulin Pen Needle (BD PEN NEEDLE NANO U/F) 32G X 4 MM MISC USE WITH INSULIN PEN AS DIRECTED TWICE DAILY  . metoprolol succinate (TOPROL-XL) 100 MG 24 hr tablet TAKE 1 TABLET BY MOUTH EVERY DAY  . nitroGLYCERIN (NITROSTAT) 0.4 MG SL tablet Place 1 tablet (0.4 mg total) under the tongue every 5 (five) minutes as needed for chest pain.  Marland Kitchen NOVOLOG MIX 70/30 FLEXPEN (70-30) 100 UNIT/ML FlexPen INJECT 40 UNITS IN THE SKIN IN THE MORNING AND 45 UNITS IN THE EVENING AS DIRECTED  . olmesartan (BENICAR) 40 MG tablet Take 1 tablet (40 mg total) by mouth daily.  . Semaglutide, 1 MG/DOSE, (OZEMPIC, 1 MG/DOSE,) 2 MG/1.5ML SOPN Inject 1 mg into the skin once a week.  . spironolactone-hydrochlorothiazide (ALDACTAZIDE) 25-25 MG tablet TAKE 1 TABLET BY MOUTH DAILY  . traMADol (ULTRAM) 50 MG  tablet Take 1 tablet (50 mg total) by mouth every 6 (six) hours as needed.     Allergies:   Dust mite extract, Other, and Pollen extract   Social History   Socioeconomic History  . Marital status: Single    Spouse name: Not on file  . Number of children: Not on file  . Years of education: Not on file  . Highest education level: Not on file  Occupational History  . Not on file  Social Needs  . Financial resource strain: Not on file  . Food insecurity    Worry: Not on file    Inability: Not on file  . Transportation needs     Medical: Not on file    Non-medical: Not on file  Tobacco Use  . Smoking status: Former Smoker    Packs/day: 0.25    Years: 45.00    Pack years: 11.25    Types: Cigarettes  . Smokeless tobacco: Never Used  Substance and Sexual Activity  . Alcohol use: Not Currently    Alcohol/week: 0.0 standard drinks  . Drug use: No  . Sexual activity: Not on file  Lifestyle  . Physical activity    Days per week: Not on file    Minutes per session: Not on file  . Stress: Not on file  Relationships  . Social Musician on phone: Not on file    Gets together: Not on file    Attends religious service: Not on file    Active member of club or organization: Not on file    Attends meetings of clubs or organizations: Not on file    Relationship status: Not on file  Other Topics Concern  . Not on file  Social History Narrative  . Not on file     Family History: The patient's family history includes Aortic stenosis in her daughter; Cancer in her father; Heart disease in her daughter; Hypertension in her father and mother; Thyroid disease in her daughter. There is no history of Breast cancer.  ROS:   Please see the history of present illness.    Right shoulder rotator cuff injury.  This interrupts her sleep.  She has gout.  Allopurinol is been recommended but she is afraid to take it.  She uses colchicine as needed.  Her lipids have never been well controlled.  Most recent LDL was 109 while taking atorvastatin twice per week.  All other systems reviewed and are negative.  EKGs/Labs/Other Studies Reviewed:    The following studies were reviewed today: No new data  EKG:  EKG the electrocardiogram demonstrates sinus rhythm at 62 bpm and is normal in appearance.  When compared to the prior tracing performed on January 16, 2018, no significant changes noted.  Recent Labs: 12/29/2017: Hemoglobin 14.0; Platelets 290 11/29/2018: ALT 20; BUN 15; Creat 0.94; Potassium 4.1; Sodium 139  Recent  Lipid Panel    Component Value Date/Time   CHOL 185 06/29/2018 1417   CHOL 167 05/28/2013 0413   TRIG 153 (H) 06/29/2018 1417   TRIG 180 05/28/2013 0413   HDL 45 06/29/2018 1417   HDL 37 (L) 05/28/2013 0413   CHOLHDL 4.1 06/29/2018 1417   CHOLHDL 3 12/02/2014 0856   VLDL 22.0 12/02/2014 0856   VLDL 36 05/28/2013 0413   LDLCALC 109 (H) 06/29/2018 1417   LDLCALC 94 05/28/2013 0413    Physical Exam:    VS:  BP 140/82   Pulse 62   Ht 5\' 5"  (  1.651 m)   Wt 204 lb 12.8 oz (92.9 kg)   SpO2 96%   BMI 34.08 kg/m     Wt Readings from Last 3 Encounters:  12/04/18 204 lb 12.8 oz (92.9 kg)  11/29/18 206 lb (93.4 kg)  09/28/18 204 lb 3.2 oz (92.6 kg)     GEN: Obese. No acute distress HEENT: Normal NECK: No JVD. LYMPHATICS: No lymphadenopathy CARDIAC:  RRR without murmur, gallop, or edema. VASCULAR:  Normal Pulses. No bruits. RESPIRATORY:  Clear to auscultation without rales, wheezing or rhonchi  ABDOMEN: Soft, non-tender, non-distended, No pulsatile mass, MUSCULOSKELETAL: No deformity  SKIN: Warm and dry NEUROLOGIC:  Alert and oriented x 3 PSYCHIATRIC:  Normal affect   ASSESSMENT:    1. CAD in native artery   2. Dyslipidemia   3. PVD (peripheral vascular disease) ([facility])   4. Essential hypertension   5. Educated About Covid-19 Virus Infection    PLAN:    In order of problems listed above:  1. Secondary prevention discussed.  Please see below.  She is falling short of her activity metric, not achieving 150 minutes of moderate activity or more per week.  We also discussed the elevated LDL 2. Referred to the lipid clinic.  May very well need PCSK9 therapy. 3. Secondary prevention discussed as above and noted below. 4. Target pressure 130/80 mmHg.  Systolic was running 140 mmHg today.  Low-salt diet is recommended.  Further up titration of therapy may be necessary.  Spironolactone might be a consideration since she has gout. 5. Social distancing, mask wearing, and handwashing  is emphasized.  Overall education and awareness concerning primary/secondary risk prevention was discussed in detail: LDL less than 70, hemoglobin A1c less than 7, blood pressure target less than 130/80 mmHg, >150 minutes of moderate aerobic activity per week, avoidance of smoking, weight control (via diet and exercise), and continued surveillance/management of/for obstructive sleep apnea.  I have recommended that she consider PCSK9 therapy.  We will refer her to the lipid clinic.  It is imperative to get LDL to 70 or less as she has high risk.     Medication Adjustments/Labs and Tests Ordered: Current medicines are reviewed at length with the patient today.  Concerns regarding medicines are outlined above.  No orders of the defined types were placed in this encounter.  No orders of the defined types were placed in this encounter.   There are no Patient Instructions on file for this visit.   Signed, Lesleigh NoeHenry W Wood III, MD  12/04/2018 3:55 PM    [facility]

## 2018-12-04 ENCOUNTER — Other Ambulatory Visit: Payer: Self-pay

## 2018-12-04 ENCOUNTER — Encounter: Payer: Self-pay | Admitting: Interventional Cardiology

## 2018-12-04 ENCOUNTER — Ambulatory Visit (INDEPENDENT_AMBULATORY_CARE_PROVIDER_SITE_OTHER): Payer: BC Managed Care – PPO | Admitting: Interventional Cardiology

## 2018-12-04 VITALS — BP 140/82 | HR 62 | Ht 65.0 in | Wt 204.8 lb

## 2018-12-04 DIAGNOSIS — E785 Hyperlipidemia, unspecified: Secondary | ICD-10-CM | POA: Diagnosis not present

## 2018-12-04 DIAGNOSIS — I2510 Atherosclerotic heart disease of native coronary artery without angina pectoris: Secondary | ICD-10-CM

## 2018-12-04 DIAGNOSIS — I10 Essential (primary) hypertension: Secondary | ICD-10-CM | POA: Diagnosis not present

## 2018-12-04 DIAGNOSIS — Z7189 Other specified counseling: Secondary | ICD-10-CM

## 2018-12-04 DIAGNOSIS — I739 Peripheral vascular disease, unspecified: Secondary | ICD-10-CM

## 2018-12-04 NOTE — Patient Instructions (Signed)
Medication Instructions:  Your physician recommends that you continue on your current medications as directed. Please refer to the Current Medication list given to you today.  If you need a refill on your cardiac medications before your next appointment, please call your pharmacy.   Lab work: None If you have labs (blood work) drawn today and your tests are completely normal, you will receive your results only by: Marland Kitchen MyChart Message (if you have MyChart) OR . A paper copy in the mail If you have any lab test that is abnormal or we need to change your treatment, we will call you to review the results.  Testing/Procedures: None  Follow-Up: At [facility], you and your health needs are our priority.  As part of our continuing mission to provide you with exceptional heart care, we have created designated Provider Care Teams.  These Care Teams include your primary Cardiologist (physician) and Advanced Practice Providers (APPs -  Physician Assistants and Nurse Practitioners) who all work together to provide you with the care you need, when you need it. You will need a follow up appointment in 12 months.  Please call our office 2 months in advance to schedule this appointment.  You may see Sinclair Grooms, MD or one of the following Advanced Practice Providers on your designated Care Team:   Truitt Merle, NP Cecilie Kicks, NP . Kathyrn Drown, NP  Any Other Special Instructions Will Be Listed Below (If Applicable).  You have been referred to the Lipid Clinic here in our office.

## 2018-12-06 LAB — COMPLETE METABOLIC PANEL WITH GFR
AG Ratio: 1.4 (calc) (ref 1.0–2.5)
ALT: 20 U/L (ref 6–29)
AST: 14 U/L (ref 10–35)
Albumin: 4 g/dL (ref 3.6–5.1)
Alkaline phosphatase (APISO): 70 U/L (ref 37–153)
BUN/Creatinine Ratio: 16 (calc) (ref 6–22)
BUN: 15 mg/dL (ref 7–25)
CO2: 27 mmol/L (ref 20–32)
Calcium: 9.4 mg/dL (ref 8.6–10.4)
Chloride: 103 mmol/L (ref 98–110)
Creat: 0.94 mg/dL — ABNORMAL HIGH (ref 0.60–0.93)
GFR, Est African American: 69 mL/min/{1.73_m2} (ref >=60)
GFR, Est Non African American: 60 mL/min/{1.73_m2} (ref >=60)
Globulin: 2.9 g/dL (calc) (ref 1.9–3.7)
Glucose, Bld: 79 mg/dL (ref 65–99)
Potassium: 4.1 mmol/L (ref 3.5–5.3)
Sodium: 139 mmol/L (ref 135–146)
Total Bilirubin: 0.7 mg/dL (ref 0.2–1.2)
Total Protein: 6.9 g/dL (ref 6.1–8.1)

## 2018-12-06 LAB — SEDIMENTATION RATE: Sed Rate: 28 mm/h (ref 0–30)

## 2018-12-06 LAB — RHEUMATOID FACTOR: Rhuematoid fact SerPl-aCnc: <14 IU/mL (ref <14)

## 2018-12-06 LAB — URIC ACID: Uric Acid, Serum: 8.3 mg/dL — ABNORMAL HIGH (ref 2.5–7.0)

## 2018-12-06 LAB — CYCLIC CITRUL PEPTIDE ANTIBODY, IGG: Cyclic Citrullin Peptide Ab: <16 UNITS

## 2018-12-06 LAB — HLA-B*58:01 TYPING: HLA-B*58:01 Typing: NEGATIVE

## 2018-12-06 NOTE — Progress Notes (Signed)
Creatinine is borderline elevated-0.94.  GFR WNL.  Uric acid elevated 8.3.  sed rate WNL.  RF and anti-CCP negative. [id number] negative.

## 2018-12-08 ENCOUNTER — Other Ambulatory Visit: Payer: Self-pay | Admitting: Interventional Cardiology

## 2018-12-08 MED ORDER — CLOPIDOGREL BISULFATE 75 MG PO TABS: 75.0000 mg | ORAL_TABLET | Freq: Every day | ORAL | 3 refills | Status: DC

## 2018-12-08 NOTE — Progress Notes (Deleted)
Office Visit Note  Patient: Dawn Wood             Date of Birth: [birth year]           MRN: [id number]             PCP: Glendale Chard, MD Referring: Glendale Chard, MD Visit Date: 12/21/2018 Occupation: '@GUAROCC' @  Subjective:  No chief complaint on file.   History of Present Illness: Dawn Wood is a 73 y.o. female ***   Activities of Daily Living:  Patient reports morning stiffness for *** {minute/hour:19697}.   Patient {ACTIONS;DENIES/REPORTS:21021675::"Denies"} nocturnal pain.  Difficulty dressing/grooming: {ACTIONS;DENIES/REPORTS:21021675::"Denies"} Difficulty climbing stairs: {ACTIONS;DENIES/REPORTS:21021675::"Denies"} Difficulty getting out of chair: {ACTIONS;DENIES/REPORTS:21021675::"Denies"} Difficulty using hands for taps, buttons, cutlery, and/or writing: {ACTIONS;DENIES/REPORTS:21021675::"Denies"}  No Rheumatology ROS completed.   PMFS History:  Patient Active Problem List   Diagnosis Date Noted  . Gout 12/26/2017  . Dyslipidemia 06/19/2014  . PVD (peripheral vascular disease) ([place]) 12/13/2013  . CAD in native artery 07/02/2013  . Essential hypertension 07/02/2013  . Diabetes mellitus type II, uncontrolled ([place]) 07/02/2013  . GERD (gastroesophageal reflux disease) 07/02/2013    Past Medical History:  Diagnosis Date  . Coronary artery disease   . Diabetes mellitus without complication ([place])   . GERD (gastroesophageal reflux disease)   . Hypertension   . MI (myocardial infarction) ([place])   . Peripheral vascular disease ([place])     Family History  Problem Relation Age of Onset  . Aortic stenosis Daughter   . Heart disease Daughter        before age 30  . Hypertension Mother   . Cancer Father   . Hypertension Father   . Thyroid disease Daughter   . Breast cancer Neg Hx    Past Surgical History:  Procedure Laterality Date  . ABDOMINAL HYSTERECTOMY  1985  . APPENDECTOMY    . CATARACT EXTRACTION Left 02/20/2018   Dr. Venetia Maxon  . CORONARY  STENT PLACEMENT  2015  . ectopic pregnancy--unilateral salpingectomy  1980  . KNEE ARTHROSCOPY Left 03/2016  . unilateral oophorectomy     Social History   Social History Narrative  . Not on file   Immunization History  Administered Date(s) Administered  . Tdap 11/04/2017     Objective: Vital Signs: There were no vitals taken for this visit.   Physical Exam   Musculoskeletal Exam: ***  CDAI Exam: CDAI Score: - Patient Global: -; Provider Global: - Swollen: -; Tender: - Joint Exam   No joint exam has been documented for this visit   There is currently no information documented on the homunculus. Go to the Rheumatology activity and complete the homunculus joint exam.  Investigation: No additional findings.  Imaging: Xr Ankle 2 Views Left  Result Date: 11/29/2018 No tibiotalar or subtalar joint space narrowing was noted.  No erosive changes were noted.  Soft tissue swelling was noted. Impression: Unremarkable x-ray of the ankle joint.  Xr Ankle 2 Views Right  Result Date: 11/29/2018 No tibiotalar or subtalar joint space narrowing was noted.  No erosive changes were noted.  Soft tissue swelling was noted. Impression: Unremarkable x-ray of the ankle joint.  Xr Foot 2 Views Left  Result Date: 11/29/2018 First MTP, PIP and DIP narrowing was noted.  Some cystic versus erosive changes were noted in the first MTP joint.  Dorsal spurring was noted.  No significant intertarsal or tibiotalar joint space narrowing was noted. Impression: These findings are consistent with osteoarthritis of the foot and possible gouty  arthropathy.  Xr Foot 2 Views Right  Result Date: 11/29/2018 First MTP narrowing with erosive changes were noted.  PIP and DIP narrowing was noted.  No significant intertarsal joint space narrowing was noted.  Dorsal spurring was noted.  A small posterior calcaneal spur was noted. Impression: These findings are consistent with osteoarthritis of the foot and possible  gouty arthropathy.  Xr Hand 2 View Left  Result Date: 11/29/2018 PIP and DIP and CMC narrowing was noted.  Second and third MCP joint narrowing was noted.  No erosive changes were noted. 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  Xr Hand 2 View Right  Result Date: 11/29/2018 Severe CMC narrowing was noted.  Second and third MCP narrowing and PIP and DIP narrowing was noted.  No intercarpal radiocarpal joint space narrowing was noted.  No erosive changes were noted. 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   Recent Labs: Lab Results  Component Value Date   WBC 8.6 12/29/2017   HGB 14.0 12/29/2017   PLT 290 12/29/2017   NA 139 11/29/2018   K 4.1 11/29/2018   CL 103 11/29/2018   CO2 27 11/29/2018   GLUCOSE 79 11/29/2018   BUN 15 11/29/2018   CREATININE 0.94 (H) 11/29/2018   BILITOT 0.7 11/29/2018   ALKPHOS 84 06/29/2018   AST 14 11/29/2018   ALT 20 11/29/2018   PROT 6.9 11/29/2018   ALBUMIN 4.2 06/29/2018   CALCIUM 9.4 11/29/2018   GFRAA 69 11/29/2018  November 29, 2018 ESR 28, RF negative, anti-CCP negative, HLA-B 58:01  negative, uric acid 8.3  Speciality Comments: No specialty comments available.  Procedures:  No procedures performed Allergies: Dust mite extract, Other, and Pollen extract   Assessment / Plan:     Visit Diagnoses: No diagnosis found.  Orders: No orders of the defined types were placed in this encounter.  No orders of the defined types were placed in this encounter.   Face-to-face time spent with patient was *** minutes. Greater than 50% of time was spent in counseling and coordination of care.  Follow-Up Instructions: No follow-ups on file.   Bo Merino, MD  Note - This record has been created using Editor, commissioning.  Chart creation errors have been sought, but may not always  have been located. Such creation errors do not  reflect on  the standard of medical care.

## 2018-12-11 ENCOUNTER — Other Ambulatory Visit: Payer: Self-pay

## 2018-12-11 MED ORDER — METOPROLOL SUCCINATE ER 100 MG PO TB24: 100.0000 mg | ORAL_TABLET | Freq: Every day | ORAL | 5 refills | Status: DC

## 2018-12-12 NOTE — Progress Notes (Signed)
Office Visit Note  Patient: Dawn Wood             Date of Birth: 02-Jan-1946           MRN: [id number]             PCP: Glendale Chard, MD Referring: Glendale Chard, MD Visit Date: 12/13/2018 Occupation: '@GUAROCC' @  Subjective:  Gout (New patient follow up, right foot gout flare x 6 days, swelling)   History of Present Illness: Dawn Wood is a 73 y.o. female with gout and osteoarthritis.  Patient states last Friday she developed pain and swelling in her right foot which is quite painful now.  She has been taking colchicine 1 tablet twice a day.  She has not noticed much improvement in the swelling.  None of the other joints are painful.  She has some chronic stiffness in her hands.  Activities of Daily Living:  Patient reports morning stiffness for 0 min Patient Reports nocturnal pain.  Difficulty dressing/grooming: Denies Difficulty climbing stairs: Reports Difficulty getting out of chair: Reports Difficulty using hands for taps, buttons, cutlery, and/or writing: Reports  Review of Systems  Constitutional: Positive for fatigue. Negative for night sweats, weight gain and weight loss.  HENT: Positive for mouth dryness. Negative for mouth sores, trouble swallowing, trouble swallowing and nose dryness.   Eyes: Positive for dryness. Negative for pain, redness and visual disturbance.  Respiratory: Negative for cough, shortness of breath and difficulty breathing.   Cardiovascular: Positive for swelling in legs/feet. Negative for chest pain, palpitations, hypertension and irregular heartbeat.  Gastrointestinal: Negative for blood in stool, constipation and diarrhea.  Endocrine: Negative for increased urination.  Genitourinary: Negative for painful urination and vaginal dryness.  Musculoskeletal: Positive for arthralgias, gait problem, joint pain and joint swelling. Negative for myalgias, muscle weakness, morning stiffness, muscle tenderness and myalgias.  Skin: Negative for  color change, rash, hair loss, skin tightness, ulcers and sensitivity to sunlight.  Allergic/Immunologic: Negative for susceptible to infections.  Neurological: Positive for numbness. Negative for dizziness, memory loss, night sweats and weakness.  Hematological: Negative for bruising/bleeding tendency and swollen glands.  Psychiatric/Behavioral: Positive for sleep disturbance. Negative for depressed mood. The patient is not nervous/anxious.     PMFS History:  Patient Active Problem List   Diagnosis Date Noted   Gout 12/26/2017   Dyslipidemia 06/19/2014   PVD (peripheral vascular disease) ([place]) 12/13/2013   CAD in native artery 07/02/2013   Essential hypertension 07/02/2013   Diabetes mellitus type II, uncontrolled ([place]) 07/02/2013   GERD (gastroesophageal reflux disease) 07/02/2013    Past Medical History:  Diagnosis Date   Coronary artery disease    Diabetes mellitus without complication ([facility])    GERD (gastroesophageal reflux disease)    Hypertension    MI (myocardial infarction) ([place])    Peripheral vascular disease ([place])     Family History  Problem Relation Age of Onset   Aortic stenosis Daughter    Heart disease Daughter        before age 16   Hypertension Mother    Cancer Father    Hypertension Father    Thyroid disease Daughter    Breast cancer Neg Hx    Past Surgical History:  Procedure Laterality Date   ABDOMINAL HYSTERECTOMY  1985   APPENDECTOMY     CATARACT EXTRACTION Left 02/20/2018   Dr. Venetia Maxon   CORONARY STENT PLACEMENT  2015   ectopic pregnancy--unilateral salpingectomy  1980   KNEE ARTHROSCOPY Left 03/2016  unilateral oophorectomy     Social History   Social History Narrative   Not on file   Immunization History  Administered Date(s) Administered   Tdap 11/04/2017     Objective: Vital Signs: BP 124/69 (BP Location: Left Arm, Patient Position: Sitting, Cuff Size: Normal)    Pulse 69    Resp 16    Ht '5\' 5"'  (1.651  m)    Wt 205 lb 9.6 oz (93.3 kg)    BMI 34.21 kg/m    Physical Exam Vitals signs and nursing note reviewed.  Constitutional:      Appearance: She is well-developed.  HENT:     Head: Normocephalic and atraumatic.  Eyes:     Conjunctiva/sclera: Conjunctivae normal.  Neck:     Musculoskeletal: Normal range of motion.  Cardiovascular:     Rate and Rhythm: Normal rate and regular rhythm.     Heart sounds: Normal heart sounds.  Pulmonary:     Effort: Pulmonary effort is normal.     Breath sounds: Normal breath sounds.  Abdominal:     General: Bowel sounds are normal.     Palpations: Abdomen is soft.  Lymphadenopathy:     Cervical: No cervical adenopathy.  Skin:    General: Skin is warm and dry.     Capillary Refill: Capillary refill takes less than 2 seconds.  Neurological:     Mental Status: She is alert and oriented to person, place, and time.  Psychiatric:        Behavior: Behavior normal.      Musculoskeletal Exam: C-spine was in good range of motion.  She had good range of motion of her shoulder joints elbow joints wrist joint MCPs PIPs DIPs with no swelling.  Bilateral knee joints are in good range of motion.  She has severe pain and swelling in her right ankle and right foot.  CDAI Exam: CDAI Score: -- Patient Global: --; Provider Global: -- Swollen: --; Tender: -- Joint Exam   No joint exam has been documented for this visit   There is currently no information documented on the homunculus. Go to the Rheumatology activity and complete the homunculus joint exam.  Investigation: No additional findings.  Imaging: Xr Ankle 2 Views Left  Result Date: 11/29/2018 No tibiotalar or subtalar joint space narrowing was noted.  No erosive changes were noted.  Soft tissue swelling was noted. Impression: Unremarkable x-ray of the ankle joint.  Xr Ankle 2 Views Right  Result Date: 11/29/2018 No tibiotalar or subtalar joint space narrowing was noted.  No erosive changes were  noted.  Soft tissue swelling was noted. Impression: Unremarkable x-ray of the ankle joint.  Xr Foot 2 Views Left  Result Date: 11/29/2018 First MTP, PIP and DIP narrowing was noted.  Some cystic versus erosive changes were noted in the first MTP joint.  Dorsal spurring was noted.  No significant intertarsal or tibiotalar joint space narrowing was noted. Impression: These findings are consistent with osteoarthritis of the foot and possible gouty arthropathy.  Xr Foot 2 Views Right  Result Date: 11/29/2018 First MTP narrowing with erosive changes were noted.  PIP and DIP narrowing was noted.  No significant intertarsal joint space narrowing was noted.  Dorsal spurring was noted.  A small posterior calcaneal spur was noted. Impression: These findings are consistent with osteoarthritis of the foot and possible gouty arthropathy.  Xr Hand 2 View Left  Result Date: 11/29/2018 PIP and DIP and CMC narrowing was noted.  Second and third MCP  joint narrowing was noted.  No erosive changes were noted. 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  Xr Hand 2 View Right  Result Date: 11/29/2018 Severe CMC narrowing was noted.  Second and third MCP narrowing and PIP and DIP narrowing was noted.  No intercarpal radiocarpal joint space narrowing was noted.  No erosive changes were noted. 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   Recent Labs: Lab Results  Component Value Date   WBC 8.6 12/29/2017   HGB 14.0 12/29/2017   PLT 290 12/29/2017   NA 139 11/29/2018   K 4.1 11/29/2018   CL 103 11/29/2018   CO2 27 11/29/2018   GLUCOSE 79 11/29/2018   BUN 15 11/29/2018   CREATININE 0.94 (H) 11/29/2018   BILITOT 0.7 11/29/2018   ALKPHOS 84 06/29/2018   AST 14 11/29/2018   ALT 20 11/29/2018   PROT 6.9 11/29/2018   ALBUMIN 4.2 06/29/2018   CALCIUM 9.4 11/29/2018   GFRAA 69 11/29/2018  ESR 28, RF  negative, anti-CCP negative, uric acid 8.3, HLA B 58: 01 negative  Speciality Comments: No specialty comments available.  Procedures:  No procedures performed Allergies: Dust mite extract, Other, and Pollen extract   Assessment / Plan:     Visit Diagnoses: Idiopathic chronic gout of multiple sites without tophus-patient has having a flare of gout currently with pain and swelling in her right foot and right ankle.  The pain is severe.  She has been taking colchicine twice a day without much relief.  We had detailed discussion regarding gout and its management.  The counseling was also provided at the last visit.  She will need a prednisone taper to abort this attack.  Side effects of prednisone were discussed.  My plan is to put her on prednisone 20 mg for 2 days and taper by 5 mg every 2 days.  She has been advised to take colchicine 1 tablet twice a day until the swelling goes down then she can change back to 1 tablet p.o. daily of colchicine.  She should stay on colchicine daily until told otherwise.  She will start allopurinol 100 mg p.o. daily 5 days later.  I will keep her on allopurinol 100 mg p.o. daily for 1 month.  If she has no side effects and her labs are stable then we will increase allopurinol to 200 mg p.o. daily.  If tolerated well then we will increase it to 300 mg p.o. daily after 1 month.  She will stay on colchicine and allopurinol combination until she is symptom free for 6 months and her uric acid to stay is less than 6.0.  Dietary modifications with low purine diet were also discussed.  We will recheck her labs in a month at the follow-up visit.  Patient was concerned about the use of allopurinol as her cousin had a reaction to the allopurinol.  Her HLA B 58: 01 test was negative which I discussed with the patient.  I also discussed the option of Uloric.  As she has underlying heart disease she agreed that allopurinol will be a better choice.  Primary osteoarthritis of both  hands-she has chronically stiffness.  Primary osteoarthritis of both feet-she has some chronic discomfort in her feet due to underlying osteoarthritis.  She has multiple medical problems which are listed as follows:  CAD in native artery  PVD (peripheral vascular disease) ([place])  Dyslipidemia  Essential hypertension  History of type 2 diabetes mellitus  History of gastroesophageal reflux (GERD)  Orders: No orders of the defined types were placed in this encounter.  No orders of the defined types were placed in this encounter.   Face-to-face time spent with patient was 30 minutes. Greater than 50% of time was spent in counseling and coordination of care.  Follow-Up Instructions: Return in about 4 weeks (around 01/10/2019) for Gout.   Bo Merino, MD  Note - This record has been created using Editor, commissioning.  Chart creation errors have been sought, but may not always  have been located. Such creation errors do not reflect on  the standard of medical care.

## 2018-12-13 ENCOUNTER — Encounter: Payer: Self-pay | Admitting: Pharmacist

## 2018-12-13 ENCOUNTER — Encounter: Payer: Self-pay | Admitting: Rheumatology

## 2018-12-13 ENCOUNTER — Ambulatory Visit: Payer: BC Managed Care – PPO | Admitting: Internal Medicine

## 2018-12-13 ENCOUNTER — Other Ambulatory Visit: Payer: Self-pay

## 2018-12-13 ENCOUNTER — Encounter: Payer: Self-pay | Admitting: *Deleted

## 2018-12-13 ENCOUNTER — Ambulatory Visit: Payer: BC Managed Care – PPO | Admitting: Rheumatology

## 2018-12-13 VITALS — BP 124/69 | HR 69 | Resp 16 | Ht 65.0 in | Wt 205.6 lb

## 2018-12-13 DIAGNOSIS — Z8639 Personal history of other endocrine, nutritional and metabolic disease: Secondary | ICD-10-CM

## 2018-12-13 DIAGNOSIS — M1A09X0 Idiopathic chronic gout, multiple sites, without tophus (tophi): Secondary | ICD-10-CM

## 2018-12-13 DIAGNOSIS — M19071 Primary osteoarthritis, right ankle and foot: Secondary | ICD-10-CM

## 2018-12-13 DIAGNOSIS — E785 Hyperlipidemia, unspecified: Secondary | ICD-10-CM

## 2018-12-13 DIAGNOSIS — M19072 Primary osteoarthritis, left ankle and foot: Secondary | ICD-10-CM

## 2018-12-13 DIAGNOSIS — I10 Essential (primary) hypertension: Secondary | ICD-10-CM

## 2018-12-13 DIAGNOSIS — M19042 Primary osteoarthritis, left hand: Secondary | ICD-10-CM

## 2018-12-13 DIAGNOSIS — I739 Peripheral vascular disease, unspecified: Secondary | ICD-10-CM

## 2018-12-13 DIAGNOSIS — I2510 Atherosclerotic heart disease of native coronary artery without angina pectoris: Secondary | ICD-10-CM

## 2018-12-13 DIAGNOSIS — M19041 Primary osteoarthritis, right hand: Secondary | ICD-10-CM

## 2018-12-13 DIAGNOSIS — Z8719 Personal history of other diseases of the digestive system: Secondary | ICD-10-CM

## 2018-12-13 MED ORDER — COLCHICINE 0.6 MG PO TABS: ORAL_TABLET | ORAL | 2 refills | Status: DC

## 2018-12-13 MED ORDER — ALLOPURINOL 100 MG PO TABS: ORAL_TABLET | ORAL | 0 refills | Status: DC

## 2018-12-13 MED ORDER — PREDNISONE 5 MG PO TABS: ORAL_TABLET | ORAL | 0 refills | Status: DC

## 2018-12-13 NOTE — Patient Instructions (Addendum)
Take prednisone 20 mg for 2 days, 15 mg for 2 days, 10 mg for 2 days and then 5 mg for 2 days.  Take colchicine 0.6 mg by mouth daily until you have been told to stop it.  If you have another flare he can increase colchicine to twice a day.  Start allopurinol 100 mg tablet, 1 tablet by mouth daily 5 days later.  Stay on allopurinol 100 mg every day for 1 month.  You will come in for blood work after 1 month and we will increase the dose of allopurinol to 200 mg which will be two 100 mg tablets by mouth daily for another month.  After that the dose of allopurinol will be increased to 300 mg tablet, 1 tablet every day.  You will stay on allopurinol and colchicine combination until your uric acid is below 6.0 and you are symptom-free for 6 months.  After that colchicine can be discontinued once we discussed in the office.  Please avoid all red meat, shellfish and alcohol.

## 2018-12-14 ENCOUNTER — Ambulatory Visit: Payer: BC Managed Care – PPO

## 2018-12-18 ENCOUNTER — Telehealth: Payer: Self-pay | Admitting: *Deleted

## 2018-12-18 ENCOUNTER — Other Ambulatory Visit: Payer: Self-pay

## 2018-12-18 MED ORDER — DEXILANT 60 MG PO CPDR: 60.0000 mg | DELAYED_RELEASE_CAPSULE | Freq: Every day | ORAL | 1 refills | Status: DC

## 2018-12-18 NOTE — Telephone Encounter (Signed)
Patient contacted the office and asked about how she is supposed to take the Allopurinol and the Colchicine. Patient advised she is to take the Colchicine for 5 days and then start taking the Allopurinol with the Colchicine. Patient states verbalized understanding.

## 2018-12-20 ENCOUNTER — Ambulatory Visit (INDEPENDENT_AMBULATORY_CARE_PROVIDER_SITE_OTHER): Payer: BC Managed Care – PPO | Admitting: Pharmacist

## 2018-12-20 ENCOUNTER — Other Ambulatory Visit: Payer: Self-pay

## 2018-12-20 DIAGNOSIS — E785 Hyperlipidemia, unspecified: Secondary | ICD-10-CM | POA: Diagnosis not present

## 2018-12-20 MED ORDER — ROSUVASTATIN CALCIUM 5 MG PO TABS: 5.0000 mg | ORAL_TABLET | Freq: Every day | ORAL | 11 refills | Status: DC

## 2018-12-20 NOTE — Progress Notes (Signed)
Patient ID: Dawn Wood                 DOB: 1945/04/09                    MRN: [id number]     HPI: Dawn Wood is a 73 y.o. female patient referred to lipid clinic by Dr. Verdis Prime. PMH is significant for CAD with LAD DE stent in 2015, DM, HTN, HLD previous MI, and PVD.  Pt presents in good spirits to clinic today. She described a recent gout flare requiring colchicine and allopurinol and was instructed to stop taking her atorvastatin due to drug interaction of worsening muscle pains. She has not taken atorvastatin in a while, was not taking it at time of labs in April. When she was taking atorvastatin, she was taking it only on MWF because of body aches. She eats mostly chicken and fish with vegetables and enjoys fruit. With diabetes, she tries to keep her blood sugar under control.   Current Medications: none currently, was on atorvastatin 20mg  on MWF Intolerances: simvastatin (does not remember why this was switched) atorvastatin 20mg  daily (muscle aches), atorvastatin 20mg  MWF (muscle aches) Risk Factors: CAD, PVD, previous MI, DM LDL goal: < 55mg /dL  Diet: Pt eats mostly chicken and fish (baked or fried) fresh green vegetables, and lots of fruits. She drinks mostly water, likes s[place] no sugar, and coffee at work.  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 Wants to walk more with daughters.  Family History: heart disease in daughter, HTN in father and mother, aortic stenosis in daughter, cancer in father, thyroid disease in daughter  Social History: single, former smoker, no alcohol or illicit drug use reported.   Labs: LDL 109, HDL 45, VLDL 31, TC 185, TG 153 (not taking statin at time of lab draw)  Past Medical History:  Diagnosis Date  . Coronary artery disease   . Diabetes mellitus without complication ([facility])   . GERD (gastroesophageal reflux disease)   . Hypertension   . MI (myocardial infarction) ([facility])   .  Peripheral vascular disease ([facility])     Current Outpatient Medications on File Prior to Visit  Medication Sig Dispense Refill  . allopurinol (ZYLOPRIM) 100 MG tablet 5 days after starting colchicine, take 1 tablet by mouth daily for 1 month. Then return to the office for labs. 30 tablet 0  . amLODipine (NORVASC) 5 MG tablet Take 1 tablet (5 mg total) by mouth daily. Please keep upcoming appointment for further refills 90 tablet 0  . aspirin 81 MG tablet Take 81 mg by mouth daily.    atorvastatin (LIPITOR) 20 MG tablet Take 1 tablet by mouth on Monday, Wednesday, Friday (Patient not taking: Reported on 12/13/2018) 90 tablet 1  . atorvastatin (LIPITOR) 20 MG tablet Take 20 mg by mouth once a week.    . Cholecalciferol (VITAMIN D3) 2000 units TABS Take 1 tablet by mouth daily.    . clopidogrel (PLAVIX) 75 MG tablet Take 1 tablet (75 mg total) by mouth daily. 90 tablet 3  . colchicine 0.6 MG tablet Take 1 tablet by mouth daily. If you experience a flare, increase to 2 tablets by mouth daily. 60 tablet 2  . dexlansoprazole (DEXILANT) 60 MG capsule Take 1 capsule (60 mg total) by mouth daily. 30 capsule 1  . fluticasone (FLONASE) 50 MCG/ACT nasal spray Place 1 spray into both nostrils daily  as needed for allergies or rhinitis. 16 g 2  . Insulin Pen Needle (BD PEN NEEDLE NANO U/F) 32G X 4 MM MISC USE WITH INSULIN PEN AS DIRECTED TWICE DAILY 100 each 2  . metoprolol succinate (TOPROL-XL) 100 MG 24 hr tablet Take 1 tablet (100 mg total) by mouth daily. Take with or immediately following a meal. 30 tablet 5  . nitroGLYCERIN (NITROSTAT) 0.4 MG SL tablet Place 1 tablet (0.4 mg total) under the tongue every 5 (five) minutes as needed for chest pain. 25 tablet 3  . NOVOLOG MIX 70/30 FLEXPEN (70-30) 100 UNIT/ML FlexPen INJECT 40 UNITS IN THE SKIN IN THE MORNING AND 45 UNITS IN THE EVENING AS DIRECTED 25 pen 2  . olmesartan (BENICAR) 40 MG tablet Take 1 tablet (40 mg total) by mouth daily. 90 tablet 2  .  predniSONE (DELTASONE) 5 MG tablet Take 20mg  po qd x2 days, take 15mg  po qd x2 days, take 10mg  po qd x2 days, take 5mg  po qd x2 days. 20 tablet 0  . Semaglutide, 1 MG/DOSE, (OZEMPIC, 1 MG/DOSE,) 2 MG/1.5ML SOPN Inject 1 mg into the skin once a week. 3 pen 1  . spironolactone-hydrochlorothiazide (ALDACTAZIDE) 25-25 MG tablet TAKE 1 TABLET BY MOUTH DAILY 90 tablet 0  . traMADol (ULTRAM) 50 MG tablet Take 1 tablet (50 mg total) by mouth every 6 (six) hours as needed. 15 tablet 0   No current facility-administered medications on file prior to visit.     Allergies  Allergen Reactions  . Dust Mite Extract Other (See Comments)    REACTION: SNEEZING  . Other Other (See Comments)    ALLERGEN: DEODORIZERS SPRAY AND PERFUMES REACTION: SNEEZING  . Pollen Extract Other (See Comments)    Reaction unknown    Assessment/Plan:  1. Hyperlipidemia - Patient remains above goal LDL < 55 mg/dL at 109 mg/dL. She endorsed statin related myalgias on atorvastatin 20mg  and possibly simvastatin, although she was not sure. Discussed option to try rosuvastatin or a PCSK9i including benefits, side effects and insurance coverage. Patient was somewhat hesitant about adding more pills but also hesitant about adding an injection (already is on 2 different injectables). Pt was agreeable to start rosuvastatin 5mg  daily and instructed to call with any side effects. Will follow up via phone call in 6-8 weeks to discuss how she is feeling and schedule repeat labs.   [place], [place] 12/20/2018 3:59 PM   Melissa D Prentiss Bells, Pharm.D, [place]  [address] [place], [place] [address]  Phone: [phone]; Fax: [phone]

## 2018-12-20 NOTE — Patient Instructions (Addendum)
Nice to see you today!  Keep up the good work with diet and exercise. Aim for a diet full of vegetables, fruit and lean meats (chicken, [place], fish). Try to limit carbs (bread, pasta, sugar, rice) and red meat. Reduce amount of foods you fry at home and limit eating fast food.   Your goal LDL is 55 mg/dL, you're currently at 109 mg/dL.  Begin taking rosuvastatin (Crestor) 5mg  daily.  We will call you in about 6-8 weeks to chat about how you are feeling.    Please give [place] a call at [phone] with any questions or concerns.

## 2018-12-21 ENCOUNTER — Ambulatory Visit: Payer: BC Managed Care – PPO | Admitting: Rheumatology

## 2018-12-28 NOTE — Progress Notes (Signed)
Office Visit Note  Patient: Dawn Wood             Date of Birth: 03/28/1945           MRN: [id number]             PCP: Glendale Chard, MD Referring: Glendale Chard, MD Visit Date: 01/11/2019 Occupation: _0 @  Subjective:  Gout (Doing better)   History of Present Illness: Dawn Wood is a 73 y.o. female with gout and osteoarthritis. She was last seen in office 12/13/2018.  She was started on allopurinol 100 mg daily and continued colchicine daily along with a short prednisone taper to help with gout flare. She stopped allopurinol due to stomach upset, diarrhea, and low blood pressure on 01/03/2019. Her stomach upset and diarrhea resolved after stopping allopurinol. She is still taking colchicine daily.  She is still having discomfort in her right big toe.  It is still swollen and painful. She reports follows a low purine diet.  She is interested in other lifestyle modifications and supplements as alternatives to medications.  She is apprehensive to starting a new gout medication.  She states she had family and loved ones who "died" from taking gout medications.  Activities of Daily Living:  Patient reports morning stiffness for unknown.   Patient Denies nocturnal pain.  Difficulty dressing/grooming: Denies Difficulty climbing stairs: Reports Difficulty getting out of chair: Denies Difficulty using hands for taps, buttons, cutlery, and/or writing: Reports  Review of Systems  Constitutional: Positive for fatigue. Negative for night sweats, weight gain and weight loss.  HENT: Negative for mouth sores, trouble swallowing, trouble swallowing, mouth dryness and nose dryness.   Eyes: Negative for pain, redness, visual disturbance and dryness.  Respiratory: Negative for cough, shortness of breath and difficulty breathing.   Cardiovascular: Positive for swelling in legs/feet. Negative for chest pain, palpitations, hypertension and irregular heartbeat.  Gastrointestinal: Positive  for constipation and diarrhea. Negative for blood in stool.  Endocrine: Positive for heat intolerance. Negative for increased urination.  Genitourinary: Negative for difficulty urinating and vaginal dryness.  Musculoskeletal: Positive for arthralgias, joint pain, joint swelling and morning stiffness. Negative for myalgias, muscle weakness, muscle tenderness and myalgias.  Skin: Negative for color change, rash, hair loss, skin tightness, ulcers and sensitivity to sunlight.  Allergic/Immunologic: Negative for susceptible to infections.  Neurological: Positive for numbness. Negative for dizziness, memory loss, night sweats and weakness.  Hematological: Positive for bruising/bleeding tendency. Negative for swollen glands.  Psychiatric/Behavioral: Negative for depressed mood and sleep disturbance. The patient is not nervous/anxious.     PMFS History:  Patient Active Problem List   Diagnosis Date Noted  . Gout 12/26/2017  . Dyslipidemia 06/19/2014  . PVD (peripheral vascular disease) ([place]) 12/13/2013  . CAD in native artery 07/02/2013  . Essential hypertension 07/02/2013  . Diabetes mellitus type II, uncontrolled ([place]) 07/02/2013  . GERD (gastroesophageal reflux disease) 07/02/2013    Past Medical History:  Diagnosis Date  . Coronary artery disease   . Diabetes mellitus without complication ([place])   . GERD (gastroesophageal reflux disease)   . Hypertension   . MI (myocardial infarction) ([place])   . Peripheral vascular disease ([place])     Family History  Problem Relation Age of Onset  . Aortic stenosis Daughter   . Heart disease Daughter        before age 65  . Hypertension Mother   . Cancer Father   . Hypertension Father   . Thyroid disease Daughter   .  Breast cancer Neg Hx    Past Surgical History:  Procedure Laterality Date  . ABDOMINAL HYSTERECTOMY  1985  . APPENDECTOMY    . CATARACT EXTRACTION Left 02/20/2018   Dr. Venetia Maxon  . CORONARY STENT PLACEMENT  2015  . ectopic  pregnancy--unilateral salpingectomy  1980  . KNEE ARTHROSCOPY Left 03/2016  . unilateral oophorectomy     Social History   Social History Narrative  . Not on file   Immunization History  Administered Date(s) Administered  . Tdap 11/04/2017     Objective: Vital Signs: BP 129/72 (BP Location: Left Arm, Patient Position: Sitting, Cuff Size: Normal)   Pulse 70   Resp 16   Ht 5' 5" (1.651 m)   Wt 207 lb 12.8 oz (94.3 kg)   BMI 34.58 kg/m    Physical Exam Vitals signs and nursing note reviewed.  Constitutional:      Appearance: She is well-developed.  HENT:     Head: Normocephalic and atraumatic.  Eyes:     Conjunctiva/sclera: Conjunctivae normal.  Neck:     Musculoskeletal: Normal range of motion.  Cardiovascular:     Rate and Rhythm: Normal rate and regular rhythm.     Heart sounds: Normal heart sounds.  Pulmonary:     Effort: Pulmonary effort is normal.     Breath sounds: Normal breath sounds.  Abdominal:     General: Bowel sounds are normal.     Palpations: Abdomen is soft.  Lymphadenopathy:     Cervical: No cervical adenopathy.  Skin:    General: Skin is warm and dry.     Capillary Refill: Capillary refill takes less than 2 seconds.  Neurological:     Mental Status: She is alert and oriented to person, place, and time.  Psychiatric:        Behavior: Behavior normal.      Musculoskeletal Exam: C-spine was in good range of motion.  She has some discomfort range of motion of her right shoulder joint.  Elbow joints wrist joint MCPs PIPs DIPs with good range of motion.  Hip joints and knee joints with good range of motion.  She has some tenderness on palpation of her right first MTP joint.  CDAI Exam: CDAI Score: - Patient Global: -; Provider Global: - Swollen: -; Tender: - Joint Exam   No joint exam has been documented for this visit   There is currently no information documented on the homunculus. Go to the Rheumatology activity and complete the homunculus  joint exam.  Investigation: No additional findings.  Imaging: No results found.  Recent Labs: Lab Results  Component Value Date   WBC 8.6 12/29/2017   HGB 14.0 12/29/2017   PLT 290 12/29/2017   NA 139 11/29/2018   K 4.1 11/29/2018   CL 103 11/29/2018   CO2 27 11/29/2018   GLUCOSE 79 11/29/2018   BUN 15 11/29/2018   CREATININE 0.94 (H) 11/29/2018   BILITOT 0.7 11/29/2018   ALKPHOS 84 06/29/2018   AST 14 11/29/2018   ALT 20 11/29/2018   PROT 6.9 11/29/2018   ALBUMIN 4.2 06/29/2018   CALCIUM 9.4 11/29/2018   GFRAA 69 11/29/2018   ESR 28, RF negative, anti-CCP negative, uric acid 8.3, HLA B 58: 01 negative  Speciality Comments: No specialty comments available.  Procedures:  No procedures performed Allergies: Dust mite extract, Other, Allopurinol, and Pollen extract   Assessment / Plan:     Visit Diagnoses: Idiopathic chronic gout of multiple sites without tophus -  uric  acid: 11/29/2018 8.3, HLA B 58: 01 test was negative.  Patient was given a trial of allopurinol but she discontinued due to his diarrhea.  She has been taking colchicine on a daily basis which has improved her symptoms.  We had detailed discussion regarding different treatment options including probenecid.  She is willing to go on probenecid.  We will start her on probenecid 500 mg half tablet p.o. twice daily.  If tolerated she will increase it to 1 tablet p.o. twice daily.  We will recheck labs in 6 weeks at the follow-up visit.  Side effects were discussed at length.  She is requesting a prednisone taper.  She states with the last taper she did not have any elevation of blood sugar.  I have advised her to monitor her blood sugar and blood pressure closely.  Primary osteoarthritis of both hands-she has some stiffness in her hands.  No synovitis was noted.  Primary osteoarthritis of both feet-proper fitting shoes were discussed.  PVD (peripheral vascular disease) ([facility])  CAD in native artery  History of  gastroesophageal reflux (GERD)  Essential hypertension-blood pressure is within normal limits.  Dyslipidemia  History of type 2 diabetes mellitus  Orders: No orders of the defined types were placed in this encounter.  Meds ordered this encounter  Medications  . predniSONE (DELTASONE) 5 MG tablet    Sig: Take 42m po qd x2 days, take 137mpo qd x2 days, take 1058mo qd x2 days, take 5mg2m qd x2 days.    Dispense:  20 tablet    Refill:  0  . probenecid (BENEMID) 500 MG tablet    Sig: Take 0.5 tablets (250 mg total) by mouth 2 (two) times daily for 7 days, THEN 1 tablet (500 mg total) 2 (two) times daily for 21 days.    Dispense:  49 tablet    Refill:  0    Face-to-face time spent with patient was 25 minutes. Greater than 50% of time was spent in counseling and coordination of care.  Follow-Up Instructions: Return in about 6 weeks (around 02/22/2019).   ShaiBo Merino  Note - This record has been created using DragEditor, commissioninghart creation errors have been sought, but may not always  have been located. Such creation errors do not reflect on  the standard of medical care.

## 2019-01-03 ENCOUNTER — Ambulatory Visit: Payer: BC Managed Care – PPO

## 2019-01-03 ENCOUNTER — Telehealth: Payer: Self-pay | Admitting: Rheumatology

## 2019-01-03 ENCOUNTER — Other Ambulatory Visit: Payer: Self-pay | Admitting: Interventional Cardiology

## 2019-01-03 ENCOUNTER — Ambulatory Visit: Payer: BC Managed Care – PPO | Admitting: Internal Medicine

## 2019-01-03 DIAGNOSIS — I10 Essential (primary) hypertension: Secondary | ICD-10-CM

## 2019-01-03 MED ORDER — SPIRONOLACTONE-HCTZ 25-25 MG PO TABS: 1.0000 | ORAL_TABLET | Freq: Every day | ORAL | 3 refills | Status: DC

## 2019-01-03 NOTE — Telephone Encounter (Signed)
Thank you for informing me.  We will further discuss at the patients follow up on 01/11/19.

## 2019-01-03 NOTE — Telephone Encounter (Signed)
Patient calling to let you know she stopped taking Allopurinol due to side effects. It was dropping her blood sugar, making her nauseous, giving her diarrhea, light headed, etc. Patient stopped medication Saturday. Please call to discuss.

## 2019-01-03 NOTE — Telephone Encounter (Signed)
Returned patient call.  Patient states that on Saturday she started to experience diarrhea, nausea, dizziness.  She checked her blood sugar and it was 70. She drank 4 oz of Pepsi and it returned to normal.  She called her local pharmacy and was advised that allopurinol could be causing her symptoms and causing her blood sugar to drop.  She stopped allopurinol on Saturday after taking 13 days of therapy.  Advised that diarrhea and nausea are common side effects allopurinol but not low blood sugar.  Asked patient if she has changed her diet and patient denied.  Advised that maybe her insulin regimen should be adjusted.  Patient is adamant allopurinol caused her low blood sugar.  She is still taking colchicine daily.  Advised patient to continue colchicine and we will follow up at her next appointment on 11/5.  Patient verbalized understanding.  All questions encouraged and answered.  Instructed patient to call with any other questions or concerns.  Mariella Saa, PharmD, [place], [facility] Pharmacist [phone]  01/03/2019 3:58 PM

## 2019-01-04 ENCOUNTER — Encounter: Payer: BC Managed Care – PPO | Admitting: Internal Medicine

## 2019-01-08 ENCOUNTER — Other Ambulatory Visit: Payer: Self-pay

## 2019-01-08 ENCOUNTER — Ambulatory Visit: Payer: BC Managed Care – PPO | Admitting: Internal Medicine

## 2019-01-08 ENCOUNTER — Other Ambulatory Visit: Payer: Self-pay | Admitting: Interventional Cardiology

## 2019-01-08 ENCOUNTER — Encounter: Payer: Self-pay | Admitting: Internal Medicine

## 2019-01-08 VITALS — BP 140/76 | HR 71 | Temp 98.9°F | Ht 65.0 in | Wt 202.6 lb

## 2019-01-08 DIAGNOSIS — N182 Chronic kidney disease, stage 2 (mild): Secondary | ICD-10-CM | POA: Diagnosis not present

## 2019-01-08 DIAGNOSIS — Z794 Long term (current) use of insulin: Secondary | ICD-10-CM

## 2019-01-08 DIAGNOSIS — E1122 Type 2 diabetes mellitus with diabetic chronic kidney disease: Secondary | ICD-10-CM | POA: Diagnosis not present

## 2019-01-08 DIAGNOSIS — M1A3710 Chronic gout due to renal impairment, right ankle and foot, without tophus (tophi): Secondary | ICD-10-CM

## 2019-01-08 DIAGNOSIS — E6609 Other obesity due to excess calories: Secondary | ICD-10-CM

## 2019-01-08 DIAGNOSIS — I1310 Hypertensive heart and chronic kidney disease without heart failure, with stage 1 through stage 4 chronic kidney disease, or unspecified chronic kidney disease: Secondary | ICD-10-CM | POA: Diagnosis not present

## 2019-01-08 DIAGNOSIS — Z6833 Body mass index (BMI) 33.0-33.9, adult: Secondary | ICD-10-CM

## 2019-01-08 MED ORDER — TRAMADOL HCL 50 MG PO TABS: 50.0000 mg | ORAL_TABLET | Freq: Two times a day (BID) | ORAL | 0 refills | Status: DC | PRN

## 2019-01-08 MED ORDER — AMLODIPINE BESYLATE 5 MG PO TABS: 5.0000 mg | ORAL_TABLET | Freq: Every day | ORAL | 3 refills | Status: DC

## 2019-01-08 NOTE — Patient Instructions (Signed)

## 2019-01-09 LAB — HEMOGLOBIN A1C
Est. average glucose Bld gHb Est-mCnc: 126 mg/dL
Hgb A1c MFr Bld: 6 % — ABNORMAL HIGH (ref 4.8–5.6)

## 2019-01-09 NOTE — Progress Notes (Signed)
Subjective:     Patient ID: Dawn Wood , female    DOB: [birth year] , 73 y.o.   MRN: [id number]   Chief Complaint  Patient presents with  . Diabetes  . Hypertension    HPI  She is here today for diabetes check. She reports recent elevated blood sugars. She thinks it is due to allopurinol, prescribed by Rheumatology. She did not want to take this medication b/c a family member had issues with this medication.   Diabetes She presents for her follow-up diabetic visit. She has type 2 diabetes mellitus. Her disease course has been stable. There are no hypoglycemic associated symptoms. Pertinent negatives for diabetes include no blurred vision. There are no hypoglycemic complications. Diabetic complications include nephropathy. Risk factors for coronary artery disease include diabetes mellitus, dyslipidemia, hypertension, obesity, sedentary lifestyle and post-menopausal. Current diabetic treatment includes insulin injections. She is following a diabetic diet. She participates in exercise intermittently. Her home blood glucose trend is fluctuating minimally. Her breakfast blood glucose is taken between 8-9 am. Her breakfast blood glucose range is generally 110-130 mg/dl. Eye exam is current.  Hypertension This is a chronic problem. The current episode started more than 1 year ago. The problem has been gradually improving since onset. Pertinent negatives include no blurred vision, palpitations or shortness of breath. Risk factors for coronary artery disease include diabetes mellitus, dyslipidemia, sedentary lifestyle and post-menopausal state. The current treatment provides moderate improvement. Compliance problems include exercise.  Hypertensive end-organ damage includes kidney disease.     Past Medical History:  Diagnosis Date  . Coronary artery disease   . Diabetes mellitus without complication ([facility])   . GERD (gastroesophageal reflux disease)   . Hypertension   . MI (myocardial infarction)  ([facility])   . Peripheral vascular disease ([facility])      Family History  Problem Relation Age of Onset  . Aortic stenosis Daughter   . Heart disease Daughter        before age 68  . Hypertension Mother   . Cancer Father   . Hypertension Father   . Thyroid disease Daughter   . Breast cancer Neg Hx      Current Outpatient Medications:  .  amLODipine (NORVASC) 5 MG tablet, Take 1 tablet (5 mg total) by mouth daily., Disp: 90 tablet, Rfl: 3 .  aspirin 81 MG tablet, Take 81 mg by mouth daily., Disp: , Rfl:  .  Cholecalciferol (VITAMIN D3) 2000 units TABS, Take 1 tablet by mouth daily., Disp: , Rfl:  .  clopidogrel (PLAVIX) 75 MG tablet, Take 1 tablet (75 mg total) by mouth daily., Disp: 90 tablet, Rfl: 3 .  colchicine 0.6 MG tablet, Take 1 tablet by mouth daily. If you experience a flare, increase to 2 tablets by mouth daily., Disp: 60 tablet, Rfl: 2 .  dexlansoprazole (DEXILANT) 60 MG capsule, Take 1 capsule (60 mg total) by mouth daily., Disp: 30 capsule, Rfl: 1 .  fluticasone (FLONASE) 50 MCG/ACT nasal spray, Place 1 spray into both nostrils daily as needed for allergies or rhinitis., Disp: 16 g, Rfl: 2 .  Insulin Pen Needle (BD PEN NEEDLE NANO U/F) 32G X 4 MM MISC, USE WITH INSULIN PEN AS DIRECTED TWICE DAILY, Disp: 100 each, Rfl: 2 .  metoprolol succinate (TOPROL-XL) 100 MG 24 hr tablet, Take 1 tablet (100 mg total) by mouth daily. Take with or immediately following a meal., Disp: 30 tablet, Rfl: 5 .  nitroGLYCERIN (NITROSTAT) 0.4 MG SL tablet, Place 1 tablet (0.4  mg total) under the tongue every 5 (five) minutes as needed for chest pain., Disp: 25 tablet, Rfl: 3 .  NOVOLOG MIX 70/30 FLEXPEN (70-30) 100 UNIT/ML FlexPen, INJECT 40 UNITS IN THE SKIN IN THE MORNING AND 45 UNITS IN THE EVENING AS DIRECTED, Disp: 25 pen, Rfl: 2 .  olmesartan (BENICAR) 40 MG tablet, Take 1 tablet (40 mg total) by mouth daily., Disp: 90 tablet, Rfl: 2 .  rosuvastatin (CRESTOR) 5 MG tablet, Take 1 tablet (5 mg total)  by mouth daily., Disp: 30 tablet, Rfl: 11 .  Semaglutide, 1 MG/DOSE, (OZEMPIC, 1 MG/DOSE,) 2 MG/1.5ML SOPN, Inject 1 mg into the skin once a week., Disp: 3 pen, Rfl: 1 .  spironolactone-hydrochlorothiazide (ALDACTAZIDE) 25-25 MG tablet, Take 1 tablet by mouth daily., Disp: 90 tablet, Rfl: 3 .  traMADol (ULTRAM) 50 MG tablet, Take 1 tablet (50 mg total) by mouth every 12 (twelve) hours as needed., Disp: 20 tablet, Rfl: 0   Allergies  Allergen Reactions  . Dust Mite Extract Other (See Comments)    REACTION: SNEEZING  . Other Other (See Comments)    ALLERGEN: DEODORIZERS SPRAY AND PERFUMES REACTION: SNEEZING  . Allopurinol     Diarrhea and the pt said it made her sugars low  . Pollen Extract Other (See Comments)    Reaction unknown     Review of Systems  Constitutional: Negative.   Eyes: Negative for blurred vision.  Respiratory: Negative.  Negative for shortness of breath.   Cardiovascular: Negative.  Negative for palpitations.  Gastrointestinal: Negative.   Neurological: Negative.   Psychiatric/Behavioral: Negative.      Today's Vitals   01/08/19 1524  BP: 140/76  Pulse: 71  Temp: 98.9 F (37.2 C)  TempSrc: Oral  Weight: 202 lb 9.6 oz (91.9 kg)  Height: 5\' 5"  (1.651 m)  PainSc: 0-No pain   Body mass index is 33.71 kg/m.   Objective:  Physical Exam Vitals signs and nursing note reviewed.  Constitutional:      Appearance: Normal appearance.  HENT:     Head: Normocephalic and atraumatic.  Cardiovascular:     Rate and Rhythm: Normal rate and regular rhythm.     Heart sounds: Normal heart sounds.  Pulmonary:     Effort: Pulmonary effort is normal.     Breath sounds: Normal breath sounds.  Skin:    General: Skin is warm.  Neurological:     General: No focal deficit present.     Mental Status: She is alert.  Psychiatric:        Mood and Affect: Mood normal.        Behavior: Behavior normal.         Assessment And Plan:     1. Type 2 diabetes mellitus with  stage 2 chronic kidney disease, with long-term current use of insulin ([place])  Previous labs reviewed in full detail. She is encouraged to incorporate more exercise into her daily routine. She is also encouraged to avoid sugary beverages, including diet drinks.   - Hemoglobin A1c  2. Hypertensive heart and renal disease with renal failure, stage 1 through stage 4 or unspecified chronic kidney disease, without heart failure  Chronic, fair control. She will continue with current meds. She is encouraged to avoid adding salt to her foods. She is again reminded regarding the importance of regular exercise.   3. 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 Unfortunately, she reports intolerance to  allopurinol. She was advised by pharmacist that her elevated BS were due to the allopurinol. She will discuss further with Dr. Corliss Skainseveshwar later this week.  4. Class 1 obesity due to excess calories with serious comorbidity and body mass index (BMI) of 33.0 to 33.9 in adult  She is encouraged to strive for BMI less than 30 to decrease cardiac risk.  She is encouraged to strive for 150 minutes of regular exercise per week.   Gwynneth Alimentobyn N Dayne Chait, MD    THE PATIENT IS ENCOURAGED TO PRACTICE SOCIAL DISTANCING DUE TO THE COVID-19 PANDEMIC.

## 2019-01-10 ENCOUNTER — Telehealth: Payer: Self-pay

## 2019-01-10 NOTE — Telephone Encounter (Signed)
Pt.notified

## 2019-01-10 NOTE — Telephone Encounter (Signed)
-----   Message from Glendale Chard, MD sent at 01/10/2019  9:39 AM EST ----- elderberry ----- Message ----- From: Michelle Nasuti, [place] Sent: 01/10/2019   8:28 AM EST To: Glendale Chard, MD  The pt wanted to know if she can have the name of a gummy supplement that she was told about at her visit that has zinc and vitamin c in it.

## 2019-01-11 ENCOUNTER — Ambulatory Visit: Payer: BC Managed Care – PPO | Admitting: Rheumatology

## 2019-01-11 ENCOUNTER — Other Ambulatory Visit: Payer: Self-pay

## 2019-01-11 ENCOUNTER — Encounter: Payer: Self-pay | Admitting: Rheumatology

## 2019-01-11 VITALS — BP 129/72 | HR 70 | Resp 16 | Ht 65.0 in | Wt 207.8 lb

## 2019-01-11 DIAGNOSIS — M19071 Primary osteoarthritis, right ankle and foot: Secondary | ICD-10-CM | POA: Diagnosis not present

## 2019-01-11 DIAGNOSIS — I2510 Atherosclerotic heart disease of native coronary artery without angina pectoris: Secondary | ICD-10-CM

## 2019-01-11 DIAGNOSIS — I739 Peripheral vascular disease, unspecified: Secondary | ICD-10-CM

## 2019-01-11 DIAGNOSIS — I10 Essential (primary) hypertension: Secondary | ICD-10-CM

## 2019-01-11 DIAGNOSIS — M19041 Primary osteoarthritis, right hand: Secondary | ICD-10-CM

## 2019-01-11 DIAGNOSIS — M1A09X0 Idiopathic chronic gout, multiple sites, without tophus (tophi): Secondary | ICD-10-CM | POA: Diagnosis not present

## 2019-01-11 DIAGNOSIS — M19072 Primary osteoarthritis, left ankle and foot: Secondary | ICD-10-CM

## 2019-01-11 DIAGNOSIS — M19042 Primary osteoarthritis, left hand: Secondary | ICD-10-CM

## 2019-01-11 DIAGNOSIS — E785 Hyperlipidemia, unspecified: Secondary | ICD-10-CM

## 2019-01-11 DIAGNOSIS — Z8639 Personal history of other endocrine, nutritional and metabolic disease: Secondary | ICD-10-CM

## 2019-01-11 DIAGNOSIS — Z8719 Personal history of other diseases of the digestive system: Secondary | ICD-10-CM

## 2019-01-11 MED ORDER — PROBENECID 500 MG PO TABS: ORAL_TABLET | ORAL | 0 refills | Status: DC

## 2019-01-11 MED ORDER — PREDNISONE 5 MG PO TABS: ORAL_TABLET | ORAL | 0 refills | Status: DC

## 2019-01-11 NOTE — Patient Instructions (Addendum)
   Continue colcrys 0.6 mg daily  Start prednisone taper (4 tablets for 2 days, then 3 tablets for 2 days, then 2 tablets for 2 days, then 1 tablet for 2 days)  Start probenecid 1/2 tablet twice a day for 1 week, then increase to 1 tablet twice a day  Continue to follow a low purine diet and avoid foods below  High-Purine Foods Include:   Alcoholic beverages (all types)  Some fish, seafood and shellfish, including anchovies, sardines, herring, mussels, codfish, scallops, trout and haddock  Some meats, such as bacon, [place], veal, venison and organ meats like liver   Moderate Purine Foods Include:   Meats, such as beef, chicken, duck, pork and ham  Shellfish, such as crab, lobster, oysters and shrimp

## 2019-01-30 ENCOUNTER — Other Ambulatory Visit: Payer: Self-pay | Admitting: Internal Medicine

## 2019-01-30 DIAGNOSIS — Z1231 Encounter for screening mammogram for malignant neoplasm of breast: Secondary | ICD-10-CM

## 2019-02-19 ENCOUNTER — Telehealth: Payer: Self-pay | Admitting: Pharmacist

## 2019-02-19 NOTE — Telephone Encounter (Signed)
Called patient to see how she was doing on rosuvastatin 5mg  daily. She states she is doing poorly. Her hips ache. I advised she stop taking until she feels better. We would try taking 1-2 times per week or try the injections we discussed at last visit. Patient would like to try taking once-twice a week. I adivsed she stop rosuvastatin until she feels better than try taking rosuvastatin 5mg  on Mondays and Thursdays. Will call patient in 4 weeks to see how she is doing and set up labs.

## 2019-02-22 ENCOUNTER — Ambulatory Visit: Payer: BC Managed Care – PPO | Admitting: Rheumatology

## 2019-03-07 LAB — HM COLONOSCOPY

## 2019-03-13 ENCOUNTER — Encounter: Payer: Self-pay | Admitting: Internal Medicine

## 2019-03-15 ENCOUNTER — Encounter: Payer: Self-pay | Admitting: Internal Medicine

## 2019-03-19 ENCOUNTER — Telehealth: Payer: Self-pay | Admitting: Pharmacist

## 2019-03-19 ENCOUNTER — Other Ambulatory Visit: Payer: Self-pay | Admitting: Internal Medicine

## 2019-03-19 NOTE — Telephone Encounter (Signed)
Called patient to see how she was doing on reduced dose of rosuvastatin 5mg  two time a week. Patient states she is "out at a store right now." Advised I will call the patient another day.

## 2019-03-21 ENCOUNTER — Other Ambulatory Visit: Payer: Self-pay

## 2019-03-21 ENCOUNTER — Ambulatory Visit: Payer: BC Managed Care – PPO | Admitting: Internal Medicine

## 2019-03-21 ENCOUNTER — Encounter: Payer: Self-pay | Admitting: Internal Medicine

## 2019-03-21 VITALS — BP 126/74 | HR 82 | Temp 98.9°F | Ht 65.0 in | Wt 205.8 lb

## 2019-03-21 DIAGNOSIS — R350 Frequency of micturition: Secondary | ICD-10-CM | POA: Diagnosis not present

## 2019-03-21 DIAGNOSIS — R1013 Epigastric pain: Secondary | ICD-10-CM | POA: Diagnosis not present

## 2019-03-21 DIAGNOSIS — R1011 Right upper quadrant pain: Secondary | ICD-10-CM

## 2019-03-21 DIAGNOSIS — M5416 Radiculopathy, lumbar region: Secondary | ICD-10-CM

## 2019-03-21 DIAGNOSIS — R0683 Snoring: Secondary | ICD-10-CM

## 2019-03-21 LAB — POCT URINALYSIS DIPSTICK
Bilirubin, UA: NEGATIVE
Blood, UA: NEGATIVE
Glucose, UA: NEGATIVE
Ketones, UA: NEGATIVE
Leukocytes, UA: NEGATIVE
Nitrite, UA: NEGATIVE
Protein, UA: NEGATIVE
Spec Grav, UA: 1.025 (ref 1.010–1.025)
Urobilinogen, UA: 0.2 E.U./dL
pH, UA: 6 (ref 5.0–8.0)

## 2019-03-21 MED ORDER — OZEMPIC (1 MG/DOSE) 2 MG/1.5ML ~~LOC~~ SOPN: 1.0000 mg | PEN_INJECTOR | SUBCUTANEOUS | 1 refills | Status: DC

## 2019-03-21 MED ORDER — GABAPENTIN 100 MG PO CAPS: ORAL_CAPSULE | ORAL | 2 refills | Status: DC

## 2019-03-21 MED ORDER — KETOROLAC TROMETHAMINE 30 MG/ML IJ SOLN
30.0000 mg | Freq: Once | INTRAMUSCULAR | Status: AC
  Administered 2019-03-21: 30 mg via INTRAMUSCULAR

## 2019-03-21 NOTE — Progress Notes (Signed)
This visit occurred during the SARS-CoV-2 public health emergency.  Safety protocols were in place, including screening questions prior to the visit, additional usage of staff PPE, and extensive cleaning of exam room while observing appropriate contact time as indicated for disinfecting solutions.  Subjective:     Patient ID: Dawn Wood , female    DOB: [birth year] , 74 y.o.   MRN: [id number]   Chief Complaint  Patient presents with  . Back Pain  . Abdominal Pain    HPI  Back Pain This is a new problem. The current episode started 1 to 4 weeks ago. The problem occurs intermittently. The pain is present in the gluteal and lumbar spine. The quality of the pain is described as aching. The pain radiates to the left thigh. The pain is at a severity of 8/10. The pain is severe. The pain is worse during the night. Associated symptoms include abdominal pain. Risk factors include lack of exercise and poor posture.  Abdominal Pain This is a recurrent problem. The current episode started in the past 7 days. The onset quality is gradual. The pain is located in the epigastric region and RUQ. The pain is moderate. The quality of the pain is dull and aching. Associated symptoms include belching. Pertinent negatives include no constipation or diarrhea. The pain is aggravated by movement. The pain is relieved by nothing.     Past Medical History:  Diagnosis Date  . Coronary artery disease   . Diabetes mellitus without complication (HCC)   . GERD (gastroesophageal reflux disease)   . Hypertension   . MI (myocardial infarction) (HCC)   . Peripheral vascular disease (HCC)      Family History  Problem Relation Age of Onset  . Aortic stenosis Daughter   . Heart disease Daughter        before age 8  . Hypertension Mother   . Cancer Father   . Hypertension Father   . Thyroid disease Daughter   . Breast cancer Neg Hx      Current Outpatient Medications:  .  amLODipine (NORVASC) 5 MG tablet,  Take 1 tablet (5 mg total) by mouth daily., Disp: 90 tablet, Rfl: 3 .  aspirin 81 MG tablet, Take 81 mg by mouth daily., Disp: , Rfl:  .  Cholecalciferol (VITAMIN D3) 2000 units TABS, Take 1 tablet by mouth daily., Disp: , Rfl:  .  clopidogrel (PLAVIX) 75 MG tablet, Take 1 tablet (75 mg total) by mouth daily., Disp: 90 tablet, Rfl: 3 .  colchicine 0.6 MG tablet, Take 1 tablet by mouth daily. If you experience a flare, increase to 2 tablets by mouth daily., Disp: 60 tablet, Rfl: 2 .  dexlansoprazole (DEXILANT) 60 MG capsule, Take 1 capsule (60 mg total) by mouth daily., Disp: 30 capsule, Rfl: 1 .  fluticasone (FLONASE) 50 MCG/ACT nasal spray, Place 1 spray into both nostrils daily as needed for allergies or rhinitis., Disp: 16 g, Rfl: 2 .  Insulin Aspart Prot & Aspart (INSULIN ASP PROT & ASP FLEXPEN) (70-30) 100 UNIT/ML SUPN, INJECT SUBCUTANEOUS 40 UNITS IN THE MORNING AND 45 UNITS AT BEDTIME, Disp: 75 mL, Rfl: 2 .  Insulin Pen Needle (BD PEN NEEDLE NANO U/F) 32G X 4 MM MISC, USE WITH INSULIN PEN AS DIRECTED TWICE DAILY, Disp: 100 each, Rfl: 2 .  metoprolol succinate (TOPROL-XL) 100 MG 24 hr tablet, Take 1 tablet (100 mg total) by mouth daily. Take with or immediately following a meal., Disp: 30 tablet, Rfl: 5 .  olmesartan (BENICAR) 40 MG tablet, Take 1 tablet (40 mg total) by mouth daily., Disp: 90 tablet, Rfl: 2 .  rosuvastatin (CRESTOR) 5 MG tablet, Take 1 tablet (5 mg total) by mouth daily. (Patient taking differently: Take 5 mg by mouth daily. 2 days per week), Disp: 30 tablet, Rfl: 11 .  spironolactone-hydrochlorothiazide (ALDACTAZIDE) 25-25 MG tablet, Take 1 tablet by mouth daily., Disp: 90 tablet, Rfl: 3 .  traMADol (ULTRAM) 50 MG tablet, Take 1 tablet (50 mg total) by mouth every 12 (twelve) hours as needed., Disp: 20 tablet, Rfl: 0 .  nitroGLYCERIN (NITROSTAT) 0.4 MG SL tablet, Place 1 tablet (0.4 mg total) under the tongue every 5 (five) minutes as needed for chest pain., Disp: 25 tablet,  Rfl: 3 .  Semaglutide, 1 MG/DOSE, (OZEMPIC, 1 MG/DOSE,) 2 MG/1.5ML SOPN, Inject 1 mg into the skin once a week., Disp: 3 pen, Rfl: 1   Allergies  Allergen Reactions  . Dust Mite Extract Other (See Comments)    REACTION: SNEEZING  . Other Other (See Comments)    ALLERGEN: DEODORIZERS SPRAY AND PERFUMES REACTION: SNEEZING  . Allopurinol     Diarrhea and the pt said it made her sugars low  . Pollen Extract Other (See Comments)    Reaction unknown     Review of Systems  Constitutional: Negative.   Respiratory: Negative.   Cardiovascular: Negative.   Gastrointestinal: Positive for abdominal pain. Negative for constipation and diarrhea.  Musculoskeletal: Positive for back pain.  Neurological: Negative.   Psychiatric/Behavioral: Negative.      Today's Vitals   03/21/19 1606  BP: 126/74  Pulse: 82  Temp: 98.9 F (37.2 C)  TempSrc: Oral  Weight: 205 lb 12.8 oz (93.4 kg)  Height: 5\' 5"  (1.651 m)  PainSc: 0-No pain   Body mass index is 34.25 kg/m.   Objective:  Physical Exam Vitals and nursing note reviewed.  Constitutional:      Appearance: Normal appearance.  HENT:     Head: Normocephalic and atraumatic.  Cardiovascular:     Rate and Rhythm: Normal rate and regular rhythm.     Heart sounds: Normal heart sounds.  Pulmonary:     Effort: Pulmonary effort is normal.     Breath sounds: Normal breath sounds.  Abdominal:     General: Bowel sounds are normal.     Palpations: Abdomen is soft.     Tenderness: There is abdominal tenderness in the right upper quadrant and epigastric area.     Comments: Obese, rounded, soft.   Musculoskeletal:     Lumbar back: Tenderness present. No bony tenderness. Negative right straight leg raise test and negative left straight leg raise test.  Skin:    General: Skin is warm.  Neurological:     General: No focal deficit present.     Mental Status: She is alert.  Psychiatric:        Mood and Affect: Mood normal.        Behavior:  Behavior normal.         Assessment And Plan:     1. Lumbar radiculopathy, acute  She was given ketorolac, 30mg  IM x 1 and rx gabapentin, 100mg  nightly. After one week, she will increase to 2 capsules nightly. She will rto in four weeks for re-evaluation.   - ketorolac (TORADOL) 30 MG/ML injection 30 mg  2. Epigastric pain  Pt advised to decrease dose of Ozempic down to 0.5mg  weekly. If her sx persist, I will check amylase, lipase levels.  3. RUQ pain  Please see #2. Previous RUQ (Sept 2018) u/s results reviewed, significant for fatty liver. I will consider repeating u/s if her sx persist.    4. Snoring  She agrees to Neuro referral for sleep study.   - Ambulatory referral to Neurology  5. Frequency of urination  I will check u/a. I suspect this is likely due to elevated BS.   - POCT Urinalysis Dipstick (81002)   [name], MD    THE PATIENT IS ENCOURAGED TO PRACTICE SOCIAL DISTANCING DUE TO THE COVID-19 PANDEMIC.

## 2019-03-23 ENCOUNTER — Ambulatory Visit
Admission: RE | Admit: 2019-03-23 | Discharge: 2019-03-23 | Disposition: A | Discharge status: Home / Self Care | Payer: BC Managed Care – PPO | Source: Ambulatory Visit | Attending: Internal Medicine | Admitting: Internal Medicine

## 2019-03-23 ENCOUNTER — Other Ambulatory Visit: Payer: Self-pay

## 2019-03-23 DIAGNOSIS — Z1231 Encounter for screening mammogram for malignant neoplasm of breast: Secondary | ICD-10-CM

## 2019-03-28 ENCOUNTER — Other Ambulatory Visit: Payer: Self-pay | Admitting: Internal Medicine

## 2019-03-28 DIAGNOSIS — R928 Other abnormal and inconclusive findings on diagnostic imaging of breast: Secondary | ICD-10-CM

## 2019-03-29 ENCOUNTER — Telehealth: Payer: Self-pay

## 2019-03-29 ENCOUNTER — Other Ambulatory Visit: Payer: Self-pay | Admitting: Internal Medicine

## 2019-03-29 DIAGNOSIS — R1013 Epigastric pain: Secondary | ICD-10-CM

## 2019-03-29 DIAGNOSIS — R1011 Right upper quadrant pain: Secondary | ICD-10-CM

## 2019-03-29 NOTE — Telephone Encounter (Signed)
Called pt to let her know that provider wants her to try the medication to see if it helps so she will know how to proceed. Pt refuses to take medication.

## 2019-03-30 ENCOUNTER — Ambulatory Visit
Admission: RE | Admit: 2019-03-30 | Discharge: 2019-03-30 | Disposition: A | Discharge status: Home / Self Care | Payer: BC Managed Care – PPO | Source: Ambulatory Visit | Attending: Internal Medicine | Admitting: Internal Medicine

## 2019-03-30 ENCOUNTER — Other Ambulatory Visit: Payer: Self-pay

## 2019-03-30 ENCOUNTER — Other Ambulatory Visit: Payer: Self-pay | Admitting: Internal Medicine

## 2019-03-30 DIAGNOSIS — R932 Abnormal findings on diagnostic imaging of liver and biliary tract: Secondary | ICD-10-CM

## 2019-03-30 DIAGNOSIS — R1011 Right upper quadrant pain: Secondary | ICD-10-CM

## 2019-03-30 DIAGNOSIS — R1013 Epigastric pain: Secondary | ICD-10-CM

## 2019-04-02 ENCOUNTER — Other Ambulatory Visit: Payer: Self-pay

## 2019-04-02 ENCOUNTER — Ambulatory Visit: Payer: BC Managed Care – PPO | Admitting: Neurology

## 2019-04-02 ENCOUNTER — Encounter: Payer: Self-pay | Admitting: Neurology

## 2019-04-02 VITALS — BP 155/79 | HR 71 | Temp 97.5°F | Ht 65.5 in | Wt 205.3 lb

## 2019-04-02 DIAGNOSIS — G4719 Other hypersomnia: Secondary | ICD-10-CM | POA: Diagnosis not present

## 2019-04-02 DIAGNOSIS — M25471 Effusion, right ankle: Secondary | ICD-10-CM

## 2019-04-02 DIAGNOSIS — E669 Obesity, unspecified: Secondary | ICD-10-CM | POA: Diagnosis not present

## 2019-04-02 DIAGNOSIS — R351 Nocturia: Secondary | ICD-10-CM | POA: Diagnosis not present

## 2019-04-02 DIAGNOSIS — R0683 Snoring: Secondary | ICD-10-CM

## 2019-04-02 DIAGNOSIS — I252 Old myocardial infarction: Secondary | ICD-10-CM

## 2019-04-02 DIAGNOSIS — M25472 Effusion, left ankle: Secondary | ICD-10-CM

## 2019-04-02 NOTE — Progress Notes (Signed)
Subjective:    Patient ID: Dawn Wood is a 74 y.o. female.  HPI     Huston Foley, MD, PhD [facility] [address] [address] [place], [place] [address]  Dear Dr. Allyne Gee, I saw your patient, Dawn Wood, upon your kind request in my sleep clinic today for initial consultation of her sleep disorder, in particular, concern for underlying obstructive sleep apnea.  The patient is unaccompanied today.  As you know, Dawn Wood is a 74 year old right-handed woman with an underlying medical history of back pain, gout, coronary artery disease with history of MI, hypertension, peripheral vascular disease, reflux disease, diabetes and obesity, who reports snoring and excessive daytime somnolence.  I reviewed your office note from 03/21/2019.  Her Epworth sleepiness score is 15 out of 24, fatigue severity score is 26 out of 63.  She typically takes a nap after coming home from work.  She works from 5 AM to 2 PM typically, is in housekeeping at [employer].  She is single, she lives alone, she has 2 grown daughters and 2 grown granddaughters, 3 great grandsons.  She has a nephew with sleep apnea.  She is reluctant to consider CPAP therapy if the need arises.  She has noticed lower extremity swelling, reports a history of gout but is also going to talk to her cardiologist about her amlodipine as advised by you.  She has significant nocturia about 3-4 times per average night but denies recurrent morning headaches.  She reports that she often falls asleep on the couch and eventually goes to bed around 9 or 10 PM, rise time is around 3:30 AM typically.  Her mother is 33 years old and she also checks on her.  Her father passed away in 07/14/96.  She quit smoking about 8 years ago, she does not drink alcohol, she does not drink caffeine on a daily basis. She has a history of recurrent strep throat and tonsillitis, was supposed to get her tonsils out at 1982 but decided  against it.    Her Past Medical History Is Significant For: Past Medical History:  Diagnosis Date  . Coronary artery disease   . Diabetes mellitus without complication (HCC)   . GERD (gastroesophageal reflux disease)   . Hypertension   . MI (myocardial infarction) (HCC)   . Peripheral vascular disease (HCC)     Her Past Surgical History Is Significant For: Past Surgical History:  Procedure Laterality Date  . ABDOMINAL HYSTERECTOMY  1985  . APPENDECTOMY    . CATARACT EXTRACTION Left 02/20/2018   Dr. Harlon Flor  . CORONARY STENT PLACEMENT  2015  . ectopic pregnancy--unilateral salpingectomy  1980  . KNEE ARTHROSCOPY Left 03/2016  . unilateral oophorectomy      Her Family History Is Significant For: Family History  Problem Relation Age of Onset  . Aortic stenosis Daughter   . Heart disease Daughter        before age 66  . Hypertension Mother   . Cancer Father   . Hypertension Father   . Thyroid disease Daughter   . Breast cancer Neg Hx     Her Social History Is Significant For: Social History   Socioeconomic History  . Marital status: Single    Spouse name: Not on file  . Number of children: Not on file  . Years of education: Not on file  . Highest education level: Not on file  Occupational History  . Not on file  Tobacco Use  .  Smoking status: Former Smoker    Packs/day: 0.25    Years: 45.00    Pack years: 11.25    Types: Cigarettes    Quit date: 01/10/2013    Years since quitting: 6.2  . Smokeless tobacco: Never Used  Substance and Sexual Activity  . Alcohol use: Not Currently    Alcohol/week: 0.0 standard drinks  . Drug use: No  . Sexual activity: Not on file  Other Topics Concern  . Not on file  Social History Narrative  . Not on file   Social Determinants of Health   Financial Resource Strain:   . Difficulty of Paying Living Expenses: Not on file  Food Insecurity:   . Worried About Charity fundraiser in the Last Year: Not on file  . Ran Out  of Food in the Last Year: Not on file  Transportation Needs:   . Lack of Transportation (Medical): Not on file  . Lack of Transportation (Non-Medical): Not on file  Physical Activity:   . Days of Exercise per Week: Not on file  . Minutes of Exercise per Session: Not on file  Stress:   . Feeling of Stress : Not on file  Social Connections:   . Frequency of Communication with Friends and Family: Not on file  . Frequency of Social Gatherings with Friends and Family: Not on file  . Attends Religious Services: Not on file  . Active Member of Clubs or Organizations: Not on file  . Attends Archivist Meetings: Not on file  . Marital Status: Not on file    Her Allergies Are:  Allergies  Allergen Reactions  . Dust Mite Extract Other (See Comments)    REACTION: SNEEZING  . Other Other (See Comments)    ALLERGEN: DEODORIZERS SPRAY AND PERFUMES REACTION: SNEEZING  . Allopurinol     Diarrhea and the pt said it made her sugars low  . Pollen Extract Other (See Comments)    Reaction unknown  :   Her Current Medications Are:  Outpatient Encounter Medications as of 04/02/2019  Medication Sig  . amLODipine (NORVASC) 5 MG tablet Take 1 tablet (5 mg total) by mouth daily.  Marland Kitchen aspirin 81 MG tablet Take 81 mg by mouth daily.  . Cholecalciferol (VITAMIN D3) 2000 units TABS Take 1 tablet by mouth daily.  . clopidogrel (PLAVIX) 75 MG tablet Take 1 tablet (75 mg total) by mouth daily.  . colchicine 0.6 MG tablet Take 1 tablet by mouth daily. If you experience a flare, increase to 2 tablets by mouth daily.  Marland Kitchen dexlansoprazole (DEXILANT) 60 MG capsule Take 1 capsule (60 mg total) by mouth daily.  . fluticasone (FLONASE) 50 MCG/ACT nasal spray Place 1 spray into both nostrils daily as needed for allergies or rhinitis.  Marland Kitchen gabapentin (NEURONTIN) 100 MG capsule One tab po qhs x 1 week, then 2 tabs po qhs  . Insulin Aspart Prot & Aspart (INSULIN ASP PROT & ASP FLEXPEN) (70-30) 100 UNIT/ML SUPN INJECT  SUBCUTANEOUS 40 UNITS IN THE MORNING AND 45 UNITS AT BEDTIME  . Insulin Pen Needle (BD PEN NEEDLE NANO U/F) 32G X 4 MM MISC USE WITH INSULIN PEN AS DIRECTED TWICE DAILY  . metoprolol succinate (TOPROL-XL) 100 MG 24 hr tablet Take 1 tablet (100 mg total) by mouth daily. Take with or immediately following a meal.  . nitroGLYCERIN (NITROSTAT) 0.4 MG SL tablet Place 1 tablet (0.4 mg total) under the tongue every 5 (five) minutes as needed for chest pain.  Marland Kitchen  olmesartan (BENICAR) 40 MG tablet Take 1 tablet (40 mg total) by mouth daily.  . rosuvastatin (CRESTOR) 5 MG tablet Take 1 tablet (5 mg total) by mouth daily. (Patient taking differently: Take 5 mg by mouth daily. 2 days per week)  . Semaglutide (OZEMPIC, 0.25 OR 0.5 MG/DOSE, [place]) Inject 0.5 mg into the skin once a week.  . spironolactone-hydrochlorothiazide (ALDACTAZIDE) 25-25 MG tablet Take 1 tablet by mouth daily.  . traMADol (ULTRAM) 50 MG tablet Take 1 tablet (50 mg total) by mouth every 12 (twelve) hours as needed.   No facility-administered encounter medications on file as of 04/02/2019.  :  Review of Systems:  Out of a complete 14 point review of systems, all are reviewed and negative with the exception of these symptoms as listed below:  Review of Systems  Neurological:       Sleep Consult. No prior sleep study snoring is present.  Epworth Sleepiness Scale 0= would never doze 1= slight chance of dozing 2= moderate chance of dozing 3= high chance of dozing  Sitting and reading:1 Watching TV:2 Sitting inactive in a public place (ex. Theater or meeting):3 As a passenger in a car for an hour without a break:2 Lying down to rest in the afternoon:3 Sitting and talking to someone:2 Sitting quietly after lunch (no alcohol):1 In a car, while stopped in traffic:1 Total:15     Objective:  Neurological Exam  Physical Exam Physical Examination:   Vitals:   04/02/19 1533  BP: (!) 155/79  Pulse: 71  Temp: (!) 97.5 F (36.4 C)     General Examination: The patient is a very pleasant 74 y.o. female in no acute distress. She appears well-developed and well-nourished and well groomed.   HEENT: Normocephalic, atraumatic, pupils are equal, round and reactive to light, extraocular tracking is good without limitation to gaze excursion or nystagmus noted. Hearing is grossly intact. Face is symmetric with normal facial animation. Speech is clear with no dysarthria noted. There is no hypophonia. There is no lip, neck/head, jaw or voice tremor. Neck is supple with full range of passive and active motion. There are no carotid bruits on auscultation. Oropharynx exam reveals: mild mouth dryness, adequate dental hygiene with several missing teeth, and moderate airway crowding, due to Larger uvula and tonsillar size of 1-2+, Mallampati class II, neck circumference of 16-1/2 inches, she has a minimal underbite. Tongue protrudes centrally and palate elevates symmetrically.  Chest: Clear to auscultation without wheezing, rhonchi or crackles noted.  Heart: S1+S2+0, regular and normal without murmurs, rubs or gallops noted.   Abdomen: Soft, non-tender and non-distended with normal bowel sounds appreciated on auscultation.  Extremities: There is 2+ pitting edema around the ankles.   Skin: Warm and dry without trophic changes noted.   Musculoskeletal: exam reveals no obvious joint deformities, tenderness or joint swelling or erythema.   Neurologically:  Mental status: The patient is awake, alert and oriented in all 4 spheres. Her immediate and remote memory, attention, language skills and fund of knowledge are appropriate. There is no evidence of aphasia, agnosia, apraxia or anomia. Speech is clear with normal prosody and enunciation. Thought process is linear. Mood is normal and affect is normal.  Cranial nerves II - XII are as described above under HEENT exam.  Motor exam: Normal bulk, strength and tone is noted. There is no tremor. Fine  motor skills and coordination: grossly intact.  Cerebellar testing: No dysmetria or intention tremor. There is no truncal or gait ataxia.  Sensory exam:  intact to light touch in the upper and lower extremities.  Gait, station and balance: She stands easily. No veering to one side is noted. No leaning to one side is noted. Posture is age-appropriate and stance is narrow based. Gait shows normal stride length and normal pace. No problems turning are noted.   Assessment and Plan:  In summary, Dawn Wood is a very pleasant 74 y.o.-year old female with an underlying medical history of back pain, gout, coronary artery disease with history of MI, hypertension, peripheral vascular disease, reflux disease, diabetes and obesity, whose history and physical exam are concerning for obstructive sleep apnea (OSA). I had a long chat with the patient about my findings and the diagnosis of OSA, its prognosis and treatment options. We talked about medical treatments, surgical interventions and non-pharmacological approaches. I explained in particular the risks and ramifications of untreated moderate to severe OSA, especially with respect to developing cardiovascular disease down the Road, including congestive heart failure, difficult to treat hypertension, cardiac arrhythmias, or stroke. Even type 2 diabetes has, in part, been linked to untreated OSA. Symptoms of untreated OSA include daytime sleepiness, memory problems, mood irritability and mood disorder such as depression and anxiety, lack of energy, as well as recurrent headaches, especially morning headaches. We talked about trying to maintain a healthy lifestyle in general, as well as the importance of weight control. We also talked about the importance of good sleep hygiene. I recommended the following at this time: sleep study.   I explained the sleep test procedure to the patient and also outlined possible surgical and non-surgical treatment options of  OSA, including the use of a custom-made dental device (which would require a referral to a specialist dentist or oral surgeon), upper airway surgical options (which would involve a referral to an ENT surgeon). I also explained the CPAP treatment option to the patient, who indicated that she would be willing to try CPAP if the need arises. I explained the importance of being compliant with PAP treatment, not only for insurance purposes but primarily to improve Her symptoms, and for the patient's long term health benefit, including to reduce Her cardiovascular risks. I answered all her questions today and the patient was in agreement. I plan to see her back after the sleep study is completed and encouraged her to call with any interim questions, concerns, problems or updates.   Thank you very much for allowing me to participate in the care of this nice patient. If I can be of any further assistance to you please do not hesitate to call me at [phone].  Sincerely,   Huston Foley, MD, PhD

## 2019-04-02 NOTE — Patient Instructions (Signed)

## 2019-04-05 ENCOUNTER — Other Ambulatory Visit: Payer: Self-pay | Admitting: Internal Medicine

## 2019-04-05 ENCOUNTER — Other Ambulatory Visit: Payer: Self-pay

## 2019-04-05 DIAGNOSIS — R932 Abnormal findings on diagnostic imaging of liver and biliary tract: Secondary | ICD-10-CM

## 2019-04-06 ENCOUNTER — Other Ambulatory Visit: Payer: Self-pay

## 2019-04-06 LAB — HEPATITIS C ANTIBODY: Hep C Virus Ab: <0.1 s/co ratio (ref 0.0–0.9)

## 2019-04-06 MED ORDER — ONETOUCH VERIO VI STRP: ORAL_STRIP | 3 refills | Status: DC

## 2019-04-11 ENCOUNTER — Ambulatory Visit
Admission: EM | Admit: 2019-04-11 | Discharge: 2019-04-11 | Disposition: A | Discharge status: Home / Self Care | Payer: BC Managed Care – PPO | Attending: Emergency Medicine | Admitting: Emergency Medicine

## 2019-04-11 ENCOUNTER — Ambulatory Visit (INDEPENDENT_AMBULATORY_CARE_PROVIDER_SITE_OTHER): Payer: BC Managed Care – PPO

## 2019-04-11 DIAGNOSIS — M549 Dorsalgia, unspecified: Secondary | ICD-10-CM | POA: Diagnosis not present

## 2019-04-11 DIAGNOSIS — R0789 Other chest pain: Secondary | ICD-10-CM | POA: Diagnosis not present

## 2019-04-11 MED ORDER — TIZANIDINE HCL 4 MG PO TABS: 4.0000 mg | ORAL_TABLET | Freq: Two times a day (BID) | ORAL | 0 refills | Status: AC | PRN

## 2019-04-11 MED ORDER — NAPROXEN 500 MG PO TABS: 500.0000 mg | ORAL_TABLET | Freq: Two times a day (BID) | ORAL | 0 refills | Status: AC
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
## 2019-04-11 NOTE — Discharge Instructions (Addendum)
Recommend RICE: rest, ice, compression, elevation as needed for pain.    Heat therapy (hot compress, warm wash red, hot showers, etc.) can help relax muscles and soothe muscle aches. Cold therapy (ice packs) can be used to help swelling both after injury and after prolonged use of areas of chronic pain/aches.  For pain: take naproxen with food  May take muscle relaxer as needed for severe pain / spasm.  (This medication may cause you to become tired so it is important you do not drink alcohol or operate heavy machinery while on this medication.  Recommend your first dose to be taken before bedtime to monitor for side effects safely)

## 2019-04-12 ENCOUNTER — Encounter: Payer: Self-pay | Admitting: Emergency Medicine

## 2019-04-12 ENCOUNTER — Other Ambulatory Visit: Payer: Self-pay | Admitting: Internal Medicine

## 2019-04-12 ENCOUNTER — Other Ambulatory Visit: Payer: Self-pay

## 2019-04-12 DIAGNOSIS — E1165 Type 2 diabetes mellitus with hyperglycemia: Secondary | ICD-10-CM

## 2019-04-12 MED ORDER — OZEMPIC (0.25 OR 0.5 MG/DOSE) 2 MG/1.5ML ~~LOC~~ SOPN: 0.5000 mg | PEN_INJECTOR | SUBCUTANEOUS | 2 refills | Status: DC

## 2019-04-13 LAB — APTT: aPTT: 32 s (ref 24–33)

## 2019-04-13 LAB — PROTIME-INR
INR: 1 (ref 0.9–1.2)
Prothrombin Time: 10.5 s (ref 9.1–12.0)

## 2019-04-13 LAB — GAMMA GT: GGT: 71 IU/L — ABNORMAL HIGH (ref 0–60)

## 2019-04-13 LAB — ALPHA-1-ANTITRYPSIN: A-1 Antitrypsin: 117 mg/dL (ref 101–187)

## 2019-04-13 LAB — AFP TUMOR MARKER: AFP, Serum, Tumor Marker: 6.1 ng/mL (ref 0.0–8.3)

## 2019-04-14 ENCOUNTER — Telehealth: Payer: Self-pay

## 2019-04-14 NOTE — Telephone Encounter (Signed)
-----   Message from Dorothyann Peng, MD sent at 04/13/2019  8:40 AM EST ----- Your GGT is elevated, this is one of your liver enzymes. Do you drink alcohol? If yes, how much? I suggest cutting back.  Your other liver labs are normal.

## 2019-04-14 NOTE — Telephone Encounter (Signed)
Unable to leave vm. Mailbox full. First attempt to give lab results

## 2019-04-20 ENCOUNTER — Other Ambulatory Visit: Payer: Self-pay

## 2019-04-20 ENCOUNTER — Ambulatory Visit
Admission: RE | Admit: 2019-04-20 | Discharge: 2019-04-20 | Disposition: A | Discharge status: Home / Self Care | Payer: BC Managed Care – PPO | Source: Ambulatory Visit | Attending: Internal Medicine | Admitting: Internal Medicine

## 2019-04-20 DIAGNOSIS — R928 Other abnormal and inconclusive findings on diagnostic imaging of breast: Secondary | ICD-10-CM

## 2019-04-23 ENCOUNTER — Other Ambulatory Visit: Payer: Self-pay

## 2019-04-23 DIAGNOSIS — K769 Liver disease, unspecified: Secondary | ICD-10-CM

## 2019-04-23 DIAGNOSIS — R932 Abnormal findings on diagnostic imaging of liver and biliary tract: Secondary | ICD-10-CM

## 2019-05-08 ENCOUNTER — Other Ambulatory Visit: Payer: Self-pay

## 2019-05-08 ENCOUNTER — Encounter: Payer: Self-pay | Admitting: Internal Medicine

## 2019-05-08 ENCOUNTER — Ambulatory Visit: Payer: BC Managed Care – PPO | Admitting: Internal Medicine

## 2019-05-08 VITALS — BP 124/82 | HR 88 | Temp 98.8°F | Ht 65.0 in | Wt 205.6 lb

## 2019-05-08 DIAGNOSIS — I1310 Hypertensive heart and chronic kidney disease without heart failure, with stage 1 through stage 4 chronic kidney disease, or unspecified chronic kidney disease: Secondary | ICD-10-CM

## 2019-05-08 DIAGNOSIS — M25551 Pain in right hip: Secondary | ICD-10-CM | POA: Diagnosis not present

## 2019-05-08 DIAGNOSIS — I739 Peripheral vascular disease, unspecified: Secondary | ICD-10-CM

## 2019-05-08 DIAGNOSIS — E6609 Other obesity due to excess calories: Secondary | ICD-10-CM

## 2019-05-08 DIAGNOSIS — N182 Chronic kidney disease, stage 2 (mild): Secondary | ICD-10-CM

## 2019-05-08 DIAGNOSIS — Z6834 Body mass index (BMI) 34.0-34.9, adult: Secondary | ICD-10-CM

## 2019-05-08 DIAGNOSIS — Z794 Long term (current) use of insulin: Secondary | ICD-10-CM

## 2019-05-08 DIAGNOSIS — M25552 Pain in left hip: Secondary | ICD-10-CM

## 2019-05-08 DIAGNOSIS — E1122 Type 2 diabetes mellitus with diabetic chronic kidney disease: Secondary | ICD-10-CM

## 2019-05-08 DIAGNOSIS — E66811 Obesity, class 1: Secondary | ICD-10-CM

## 2019-05-08 NOTE — Progress Notes (Signed)
This visit occurred during the SARS-CoV-2 public health emergency.  Safety protocols were in place, including screening questions prior to the visit, additional usage of staff PPE, and extensive cleaning of exam room while observing appropriate contact time as indicated for disinfecting solutions.  Subjective:     Patient ID: Dawn Wood , female    DOB: [birth year] , 74 y.o.   MRN: [id number]   Chief Complaint  Patient presents with  . Diabetes  . Hypertension    HPI  Diabetes She presents for her follow-up diabetic visit. She has type 2 diabetes mellitus. Her disease course has been stable. There are no hypoglycemic associated symptoms. Pertinent negatives for diabetes include no blurred vision. There are no hypoglycemic complications. Diabetic complications include nephropathy. Risk factors for coronary artery disease include diabetes mellitus, dyslipidemia, hypertension, obesity, sedentary lifestyle and post-menopausal. Current diabetic treatment includes insulin injections. She is following a diabetic diet. She participates in exercise intermittently. Her home blood glucose trend is fluctuating minimally. Her breakfast blood glucose is taken between 8-9 am. Her breakfast blood glucose range is generally 110-130 mg/dl. Eye exam is current.  Hypertension This is a chronic problem. The current episode started more than 1 year ago. The problem has been gradually improving since onset. Pertinent negatives include no blurred vision, palpitations or shortness of breath. Risk factors for coronary artery disease include diabetes mellitus, dyslipidemia, sedentary lifestyle and post-menopausal state. Hypertensive end-organ damage includes kidney disease.     Past Medical History:  Diagnosis Date  . Coronary artery disease   . Diabetes mellitus without complication ([place])   . GERD (gastroesophageal reflux disease)   . Hypertension   . MI (myocardial infarction) ([place])   . Peripheral  vascular disease ([place])      Family History  Problem Relation Age of Onset  . Aortic stenosis Daughter   . Heart disease Daughter        before age 36  . Hypertension Mother   . Cancer Father   . Hypertension Father   . Thyroid disease Daughter   . Breast cancer Neg Hx      Current Outpatient Medications:  .  amLODipine (NORVASC) 5 MG tablet, Take 1 tablet (5 mg total) by mouth daily., Disp: 90 tablet, Rfl: 3 .  aspirin 81 MG tablet, Take 81 mg by mouth daily., Disp: , Rfl:  .  Cholecalciferol (VITAMIN D3) 2000 units TABS, Take 1 tablet by mouth daily., Disp: , Rfl:  .  clopidogrel (PLAVIX) 75 MG tablet, Take 1 tablet (75 mg total) by mouth daily., Disp: 90 tablet, Rfl: 3 .  colchicine 0.6 MG tablet, Take 1 tablet by mouth daily. If you experience a flare, increase to 2 tablets by mouth daily., Disp: 60 tablet, Rfl: 2 .  dexlansoprazole (DEXILANT) 60 MG capsule, Take 1 capsule (60 mg total) by mouth daily., Disp: 30 capsule, Rfl: 1 .  fluticasone (FLONASE) 50 MCG/ACT nasal spray, Place 1 spray into both nostrils daily as needed for allergies or rhinitis., Disp: 16 g, Rfl: 2 .  Insulin Aspart Prot & Aspart (INSULIN ASP PROT & ASP FLEXPEN) (70-30) 100 UNIT/ML SUPN, INJECT SUBCUTANEOUS 40 UNITS IN THE MORNING AND 45 UNITS AT BEDTIME, Disp: 75 mL, Rfl: 2 .  Insulin Pen Needle (BD PEN NEEDLE NANO U/F) 32G X 4 MM MISC, USE WITH INSULIN PEN AS DIRECTED TWICE DAILY, Disp: 100 each, Rfl: 2 .  metoprolol succinate (TOPROL-XL) 100 MG 24 hr tablet, Take 1 tablet (100 mg total) by mouth  daily. Take with or immediately following a meal., Disp: 30 tablet, Rfl: 5 .  naproxen (NAPROSYN) 500 MG tablet, Take 500 mg by mouth 2 (two) times daily as needed for moderate pain., Disp: , Rfl:  .  nitroGLYCERIN (NITROSTAT) 0.4 MG SL tablet, Place 1 tablet (0.4 mg total) under the tongue every 5 (five) minutes as needed for chest pain., Disp: 25 tablet, Rfl: 3 .  olmesartan (BENICAR) 40 MG tablet, Take 1 tablet (40  mg total) by mouth daily., Disp: 90 tablet, Rfl: 2 .  ONETOUCH VERIO test strip, Use as instructed to check blood sugars 2 times per day dx: e11.65, Disp: 100 strip, Rfl: 3 .  rosuvastatin (CRESTOR) 5 MG tablet, Take 1 tablet (5 mg total) by mouth daily. (Patient taking differently: Take 5 mg by mouth daily. 2 days per week), Disp: 30 tablet, Rfl: 11 .  Semaglutide,0.25 or 0.5MG/DOS, (OZEMPIC, 0.25 OR 0.5 MG/DOSE,) 2 MG/1.5ML SOPN, Inject 0.5 mg into the skin once a week., Disp: 15 mL, Rfl: 2 .  spironolactone-hydrochlorothiazide (ALDACTAZIDE) 25-25 MG tablet, Take 1 tablet by mouth daily., Disp: 90 tablet, Rfl: 3   Allergies  Allergen Reactions  . Dust Mite Extract Other (See Comments)    REACTION: SNEEZING  . Other Other (See Comments)    ALLERGEN: DEODORIZERS SPRAY AND PERFUMES REACTION: SNEEZING  . Allopurinol     Diarrhea and the pt said it made her sugars low  . Pollen Extract Other (See Comments)    Reaction unknown     Review of Systems  Constitutional: Negative.   Eyes: Negative for blurred vision.  Respiratory: Negative.  Negative for shortness of breath.   Cardiovascular: Negative.  Negative for palpitations.  Gastrointestinal: Negative.   Musculoskeletal: Positive for arthralgias.       She c/o b/l hip pain.  This interferes with her ability to exercise regularly.   Neurological: Negative.   Psychiatric/Behavioral: Negative.      Today's Vitals   05/08/19 1520  BP: 124/82  Pulse: 88  Temp: 98.8 F (37.1 C)  Weight: 205 lb 9.6 oz (93.3 kg)  Height: '5\' 5"'  (1.651 m)   Body mass index is 34.21 kg/m.   Wt Readings from Last 3 Encounters:  05/08/19 205 lb 9.6 oz (93.3 kg)  04/02/19 205 lb 5 oz (93.1 kg)  03/21/19 205 lb 12.8 oz (93.4 kg)     Objective:  Physical Exam Vitals and nursing note reviewed.  Constitutional:      Appearance: Normal appearance.  HENT:     Head: Normocephalic and atraumatic.  Cardiovascular:     Rate and Rhythm: Normal rate and  regular rhythm.     Heart sounds: Normal heart sounds.  Pulmonary:     Effort: Pulmonary effort is normal.     Breath sounds: Normal breath sounds.  Skin:    General: Skin is warm.  Neurological:     General: No focal deficit present.     Mental Status: She is alert.  Psychiatric:        Mood and Affect: Mood normal.        Behavior: Behavior normal.         Assessment And Plan:    1. Type 2 diabetes mellitus with stage 2 chronic kidney disease, with long-term current use of insulin (HCC)  Chronic, importance of dietary and medication compliance was discussed with the patient. She is aware of glycemic goals. I will check renal function today.   - BMP8+EGFR - Hemoglobin A1c  2. Hypertensive heart and renal disease with renal failure, stage 1 through stage 4 or unspecified chronic kidney disease, without heart failure  Chronic. She is encouraged to stay well hydrated, limit her salt intake and to exercise no less than 30 minutes five days per week.   3. Bilateral hip pain  Her sx are possibly due to OA. She does not wish to see Ortho at this time. She is encouraged to follow an anti-inflammatory diet.    4. PVD (peripheral vascular disease) (HCC)  Chronic. I will refer her to Vascular for further evaluation. She is agreeable to referral. Importance of statin compliance and regular exercise was discussed with the patient.   - Ambulatory referral to Vascular Surgery  5. Class 1 obesity due to excess calories with serious comorbidity and body mass index (BMI) of 34.0 to 34.9 in adult  She is encouraged to strive for BMI less than 30 to decrease cardiac risk.   I personally spent 25 minutes face-to-face and non-face-to-face in the care of this patient, which includes all pre-, intra-, and post visit time on the date of service.   [name], MD    THE PATIENT IS ENCOURAGED TO PRACTICE SOCIAL DISTANCING DUE TO THE COVID-19 PANDEMIC.

## 2019-05-08 NOTE — Patient Instructions (Signed)
Peripheral Vascular Disease  Peripheral vascular disease (PVD) is a disease of the blood vessels that are not part of your heart and brain. A simple term for PVD is poor circulation. In most cases, PVD narrows the blood vessels that carry blood from your heart to the rest of your body. This can reduce the supply of blood to your arms, legs, and internal organs, like your stomach or kidneys. However, PVD most often affects a person's lower legs and feet. Without treatment, PVD tends to get worse. PVD can also lead to acute ischemic limb. This is when an arm or leg suddenly cannot get enough blood. This is a medical emergency. Follow these instructions at home: Lifestyle  Do not use any products that contain nicotine or tobacco, such as cigarettes and e-cigarettes. If you need help quitting, ask your doctor.  Lose weight if you are overweight. Or, stay at a healthy weight as told by your doctor.  Eat a diet that is low in fat and cholesterol. If you need help, ask your doctor.  Exercise regularly. Ask your doctor for activities that are right for you. General instructions  Take over-the-counter and prescription medicines only as told by your doctor.  Take good care of your feet: ? Wear comfortable shoes that fit well. ? Check your feet often for any cuts or sores.  Keep all follow-up visits as told by your doctor This is important. Contact a doctor if:  You have cramps in your legs when you walk.  You have leg pain when you are at rest.  You have coldness in a leg or foot.  Your skin changes.  You are unable to get or have an erection (erectile dysfunction).  You have cuts or sores on your feet that do not heal. Get help right away if:  Your arm or leg turns cold, numb, and blue.  Your arms or legs become red, warm, swollen, painful, or numb.  You have chest pain.  You have trouble breathing.  You suddenly have weakness in your face, arm, or leg.  You become very  confused or you cannot speak.  You suddenly have a very bad headache.  You suddenly cannot see. Summary  Peripheral vascular disease (PVD) is a disease of the blood vessels.  A simple term for PVD is poor circulation. Without treatment, PVD tends to get worse.  Treatment may include exercise, low fat and low cholesterol diet, and quitting smoking. This information is not intended to replace advice given to you by your health care provider. Make sure you discuss any questions you have with your health care provider. Document Revised: 02/04/2017 Document Reviewed: 04/01/2016 Elsevier Patient Education  2020 Else[employer] Inc.  

## 2019-05-09 LAB — HEMOGLOBIN A1C
Est. average glucose Bld gHb Est-mCnc: 134 mg/dL
Hgb A1c MFr Bld: 6.3 % — ABNORMAL HIGH (ref 4.8–5.6)

## 2019-05-09 LAB — BMP8+EGFR
BUN/Creatinine Ratio: 18 (ref 12–28)
BUN: 20 mg/dL (ref 8–27)
CO2: 25 mmol/L (ref 20–29)
Calcium: 10 mg/dL (ref 8.7–10.3)
Chloride: 99 mmol/L (ref 96–106)
Creatinine, Ser: 1.11 mg/dL — ABNORMAL HIGH (ref 0.57–1.00)
GFR calc Af Amer: 57 mL/min/{1.73_m2} — ABNORMAL LOW (ref >59)
GFR calc non Af Amer: 49 mL/min/{1.73_m2} — ABNORMAL LOW (ref >59)
Glucose: 96 mg/dL (ref 65–99)
Potassium: 4.3 mmol/L (ref 3.5–5.2)
Sodium: 139 mmol/L (ref 134–144)

## 2019-05-18 ENCOUNTER — Other Ambulatory Visit: Payer: Self-pay

## 2019-05-18 ENCOUNTER — Ambulatory Visit (INDEPENDENT_AMBULATORY_CARE_PROVIDER_SITE_OTHER): Payer: BC Managed Care – PPO | Admitting: Neurology

## 2019-05-18 ENCOUNTER — Other Ambulatory Visit: Payer: Self-pay | Admitting: Internal Medicine

## 2019-05-18 DIAGNOSIS — E669 Obesity, unspecified: Secondary | ICD-10-CM

## 2019-05-18 DIAGNOSIS — R351 Nocturia: Secondary | ICD-10-CM

## 2019-05-18 DIAGNOSIS — R0683 Snoring: Secondary | ICD-10-CM

## 2019-05-18 DIAGNOSIS — I252 Old myocardial infarction: Secondary | ICD-10-CM

## 2019-05-18 DIAGNOSIS — G4733 Obstructive sleep apnea (adult) (pediatric): Secondary | ICD-10-CM

## 2019-05-18 DIAGNOSIS — G4720 Circadian rhythm sleep disorder, unspecified type: Secondary | ICD-10-CM

## 2019-05-18 DIAGNOSIS — G4719 Other hypersomnia: Secondary | ICD-10-CM

## 2019-05-18 DIAGNOSIS — M25472 Effusion, left ankle: Secondary | ICD-10-CM

## 2019-05-18 DIAGNOSIS — M25471 Effusion, right ankle: Secondary | ICD-10-CM

## 2019-05-22 ENCOUNTER — Telehealth: Payer: Self-pay | Admitting: Pharmacist

## 2019-05-22 DIAGNOSIS — E785 Hyperlipidemia, unspecified: Secondary | ICD-10-CM

## 2019-05-22 NOTE — Telephone Encounter (Signed)
Called patient to see how she was doing on rosuvastatin 5mg  two times a week. States she forgot to take for a few weeks but is back on now. Didn't notice any difference in achiness when she was off of rosuvastatin. States that her whole body aches. Dr. it is from sciatic nerve. She saw GI who states she has fatty liver. Wants to try to lose some weight. A1C is 6. Wants to try to start walking- started taking stairs-but it hurts her back and hips. Scheduled patient to check labs 4/5. Don't think the pain she experienced on 5mg  daily was from statin, but will see what her lipid panel looks like and discuss increasing to 3 times a week or daily vs adding [id number].

## 2019-05-29 ENCOUNTER — Telehealth: Payer: Self-pay

## 2019-05-29 NOTE — Telephone Encounter (Signed)
I called pt. I advised pt that Dr. Frances Furbish reviewed their sleep study results and found that pt has osa and recommends that pt be treated with a cpap. Pt is very reluctant to start cpap. She does not want anything on her face. I offered her an appt with Dr. Frances Furbish to discuss further. She would prefer to postpone the titration study until she speaks with Dr. Frances Furbish. Pt verbalized understanding of results and of new appt date and time on 06/05/19 at 3:00pm, check in at 2:30pm.

## 2019-05-29 NOTE — Telephone Encounter (Signed)
-----   Message from Huston Foley, MD sent at 05/29/2019  8:06 AM EDT ----- Patient referred by Dr. Allyne Gee, seen by me on 04/02/19, diagnostic PSG on 05/18/19.   Please call and notify the patient that the recent sleep study showed obstructive sleep apnea (overall mild obstructive sleep apnea, severe in REM sleep with a total AHI of 13.1/hour, REM AHI of 30.4/hour, supine AHI of 17.9/hour, and O2 nadir of 81%). Given her med. Hx and sleep related complaints, I recommend treatment for her OSA in the form of CPAP. This will require a repeat sleep study for proper titration and mask fitting and correct monitoring of the oxygen saturations. Please explain to patient. I have placed an order in the chart. Thanks.  Huston Foley, MD, PhD [facility])

## 2019-05-29 NOTE — Addendum Note (Signed)
Addended by: Huston Foley on: 05/29/2019 08:06 AM   Modules accepted: Orders

## 2019-05-29 NOTE — Procedures (Signed)
PATIENT'S NAME:  Dawn Wood, Dawn Wood DOB:      April 04, 1945      MR#:    [id number]     DATE OF RECORDING: 05/18/2019 REFERRING M.D.:  Dorothyann Peng MD Study Performed:   Baseline Polysomnogram HISTORY: 74 year old woman with a history of back pain, gout, coronary artery disease with history of MI, hypertension, peripheral vascular disease, reflux disease, diabetes and obesity, who reports snoring and excessive daytime somnolence. The patient endorsed the Epworth Sleepiness Scale at 15 points. The patient's weight 205 pounds with a height of 65.5 (inches), resulting in a BMI of 34.2 kg/m2. The patient's neck circumference measured 16.5 inches.  CURRENT MEDICATIONS: Norvasc, ASA 81mg , Vit D3, Plavix, Colchicine, Dexilant, Flonase, Neurontin, Insulin Flexpen, Toprol-XL, Nitrostat, Benicar, Crestor, Ozempic, Aldactazide, Ultram   PROCEDURE:  This is a multichannel digital polysomnogram utilizing the Somnostar 11.2 system.  Electrodes and sensors were applied and monitored per AASM Specifications.   EEG, EOG, Chin and Limb EMG, were sampled at 200 Hz.  ECG, Snore and Nasal Pressure, Thermal Airflow, Respiratory Effort, CPAP Flow and Pressure, Oximetry was sampled at 50 Hz. Digital video and audio were recorded.      BASELINE STUDY  Lights Out was at 22:02 and Lights On at 05:00.  Total recording time (TRT) was 418.5 minutes, with a total sleep time (TST) of 321.5 minutes. The patient's sleep latency was 2.5 minutes.  REM latency was 251 minutes, which is markedly delayed. The sleep efficiency was 76.8 %.     SLEEP ARCHITECTURE: WASO (Wake after sleep onset) was 93 minutes with moderate sleep fragmentation noted. There were 18.5 minutes in Stage N1, 209 minutes Stage N2, 52.5 minutes Stage N3 and 41.5 minutes in Stage REM.  The percentage of Stage N1 was 5.8%, Stage N2 was 65.%, which is increased, Stage N3 was 16.3% and Stage R (REM sleep) was 12.9%, which is reduced. The arousals were noted as: 26 were  spontaneous, 4 were associated with PLMs, 12 were associated with respiratory events.  RESPIRATORY ANALYSIS:  There were a total of 70 respiratory events: 0 obstructive apneas, 0 central apneas and 0 mixed apneas with a total of 0 apneas and an apnea index (AI) of 0 /hour. There were 70 hypopneas with a hypopnea index of 13.1 /hour. The patient also had 0 respiratory event related arousals (RERAs).      The total APNEA/HYPOPNEA INDEX (AHI) was 13.1/hour and the total RESPIRATORY DISTURBANCE INDEX was  13.1 /hour.  21 events occurred in REM sleep and 98 events in NREM. The REM AHI was  30.4 /hour, versus a non-REM AHI of 10.5. The patient spent 67 minutes of total sleep time in the supine position and 255 minutes in non-supine.. The supine AHI was 17.9 versus a non-supine AHI of 11.8.  OXYGEN SATURATION & C02:  The Wake baseline 02 saturation was 90%, with the lowest being 81%. Time spent below 89% saturation equaled 34 minutes.   PERIODIC LIMB MOVEMENTS: The patient had a total of 11 Periodic Limb Movements.  The Periodic Limb Movement (PLM) index was 2.1 and the PLM Arousal index was .7/hour.  Audio and video analysis did not show any abnormal or unusual movements, behaviors, phonations or vocalizations with the exception of having to get up due to leg cramps and she ate mustard, which she brought from home. The patient took 3 bathroom breaks. Moderate snoring was noted. The EKG was in keeping with normal sinus rhythm (NSR). Post-study, the patient indicated that sleep was worse  than usual.   IMPRESSION: 1. Obstructive Sleep Apnea (OSA) 2. Dysfunctions associated with sleep stages or arousal from sleep  RECOMMENDATIONS: 1. This study demonstrates overall mild obstructive sleep apnea, severe in REM sleep with a total AHI of 13.1/hour, REM AHI of 30.4/hour, supine AHI of 17.9/hour, and O2 nadir of 81%. Given the patient's medical history and sleep related complaints, treatment with positive airway  pressure is recommended; a full-night CPAP titration study is advised to optimize therapy. Other treatment options may include avoidance of supine sleep position along with weight loss, upper airway or jaw surgery in selected patients or the use of an oral appliance in certain patients. ENT evaluation and/or consultation with a maxillofacial surgeon or dentist may be feasible and some instances. Please note that untreated obstructive sleep apnea may carry additional perioperative morbidity. Patients with significant obstructive sleep apnea (typically, in the moderate to severe degree) should receive, if possible, perioperative PAP (positive airway pressure) therapy and the surgeons and particularly the anesthesiologists should be informed of the diagnosis and the severity of the sleep disordered breathing. If feasible, the patient may be asked to bring his/her CPAP or BiPAP machine for a planned/elective surgery.    2. This study shows sleep fragmentation and abnormal sleep stage percentages; these are nonspecific findings and per se do not signify an intrinsic sleep disorder or a cause for the patient's sleep-related symptoms. Causes include (but are not limited to) the first night effect of the sleep study, circadian rhythm disturbances, medication effect or an underlying mood disorder or medical problem.  3. The patient should be cautioned not to drive, work at heights, or operate dangerous or heavy equipment when tired or sleepy. Review and reiteration of good sleep hygiene measures should be pursued with any patient. 4. The patient will be seen in follow-up in the sleep clinic at [facility] for discussion of the test results, symptom and treatment compliance review, further management strategies, etc. The referring provider will be notified of the test results.  I certify that I have reviewed the entire raw data recording prior to the issuance of this report in accordance with the Standards of Accreditation of  the American Academy of Sleep Medicine (AASM)   Star Age, MD, PhD Diplomat, American Board of Neurology and Sleep Medicine (Neurology and Sleep Medicine)

## 2019-05-29 NOTE — Progress Notes (Signed)
Patient referred by Dr. Allyne Gee, seen by me on 04/02/19, diagnostic PSG on 05/18/19.   Please call and notify the patient that the recent sleep study showed obstructive sleep apnea (overall mild obstructive sleep apnea, severe in REM sleep with a total AHI of 13.1/hour, REM AHI of 30.4/hour, supine AHI of 17.9/hour, and O2 nadir of 81%). Given her med. Hx and sleep related complaints, I recommend treatment for her OSA in the form of CPAP. This will require a repeat sleep study for proper titration and mask fitting and correct monitoring of the oxygen saturations. Please explain to patient. I have placed an order in the chart. Thanks.  Huston Foley, MD, PhD [facility])

## 2019-06-05 ENCOUNTER — Ambulatory Visit (INDEPENDENT_AMBULATORY_CARE_PROVIDER_SITE_OTHER): Payer: BC Managed Care – PPO | Admitting: Neurology

## 2019-06-05 ENCOUNTER — Other Ambulatory Visit: Payer: Self-pay

## 2019-06-05 ENCOUNTER — Encounter: Payer: Self-pay | Admitting: Neurology

## 2019-06-05 VITALS — BP 148/70 | HR 77 | Ht 65.5 in | Wt 204.0 lb

## 2019-06-05 DIAGNOSIS — G4733 Obstructive sleep apnea (adult) (pediatric): Secondary | ICD-10-CM

## 2019-06-05 NOTE — Patient Instructions (Signed)
As discussed, your sleep study showed overall mild sleep apnea, does get in the severe range when you are in dream sleep/REM sleep.  We can consider treatment in the form of AutoPap through a DME company.  We would have to see you within 90 days of starting the AutoPap treatment.  Your insurance may require that you use the autoPAP at least 4 hours each night on 70% of the nights. I recommend, that you not skip any nights and use it throughout the night if you can. Getting used to PAP and staying with the treatment long term does take time and patience and discipline. Untreated obstructive sleep apnea when it is moderate to severe can have an adverse impact on cardiovascular health and raise her risk for heart disease, arrhythmias, hypertension, congestive heart failure, stroke and diabetes. Untreated obstructive sleep apnea causes sleep disruption, nonrestorative sleep, and sleep deprivation. This can have an impact on your day to day functioning and cause daytime sleepiness and impairment of cognitive function, memory loss, mood disturbance, and problems focussing. Using PAP regularly can improve these symptoms.  We can consider a second sleep study for CPAP titration after which I can write for a CPAP machine.  If you wish to consider dental treatment I would be happy make a referral to a dentist.  Surgical intervention for sleep apnea is never first-line.  You can also discuss your treatment for sleep apnea with your primary care physician, Dr. Allyne Gee.  Weight loss is recommended.  If you need more help with weight loss, you can talk to your primary care about options.  At this juncture, I will see you back as needed.

## 2019-06-05 NOTE — Progress Notes (Signed)
Subjective:    Patient ID: Dawn Wood is a 74 y.o. female.  HPI     Interim history:   [name] is a 74 year old right-handed woman with an underlying medical history of back pain, gout, coronary artery disease with history of MI, hypertension, peripheral vascular disease, reflux disease, diabetes and obesity, who presents for follow up consultation of her sleep disorder, after interim sleep testing. The patient is unaccompanied today.  I first met her on 04/02/2019 at the request of her primary care physician, at which time she reported snoring and excessive daytime somnolence.  She was advised to proceed with a sleep study.  She had a baseline sleep study on 05/18/2019.  She had a reduced percentage of REM sleep, REM latency was delayed at 251 minutes.  Overall sleep efficiency was 76.8%.  Total AHI was 13.1/h, REM AHI in the severe range at 30.4/h, supine AHI was in the moderate range at 17.9/h, average oxygen saturation was 90%, nadir was 81%, she had no significant PLM's or EKG or EEG changes.  She was advised to consider CPAP therapy.  She was encouraged to return for a proper CPAP titration study.  She was reluctant to proceed with CPAP therapy and requested a follow-up appointment.  06/05/19: She reports that she Is reluctant to consider a CPAP machine.  We talked about the AutoPap option as well.  She is reluctant to have any type of interface or machine.  She also indicates that she is out of town every other weekend. She is not sure that she would tolerate something in her mouth.  She has multiple missing teeth and had partial dentures back in the 70s but eventually stopped using them. She is working on weight loss.  The patient's allergies, current medications, family history, past medical history, past social history, past surgical history and problem list were reviewed and updated as appropriate.   Previously:   04/02/19: (She) reports snoring and excessive daytime  somnolence.  I reviewed your office note from 03/21/2019.  Her Epworth sleepiness score is 15 out of 24, fatigue severity score is 26 out of 63.  She typically takes a nap after coming home from work.  She works from 5 AM to 2 PM typically, is in housekeeping at [employer].  She is single, she lives alone, she has 2 grown daughters and 2 grown granddaughters, 3 great grandsons.  She has a nephew with sleep apnea.  She is reluctant to consider CPAP therapy if the need arises.  She has noticed lower extremity swelling, reports a history of gout but is also going to talk to her cardiologist about her amlodipine as advised by you.  She has significant nocturia about 3-4 times per average night but denies recurrent morning headaches.  She reports that she often falls asleep on the couch and eventually goes to bed around 9 or 10 PM, rise time is around 3:30 AM typically.  Her mother is 75 years old and she also checks on her.  Her father passed away in 05/22/96.  She quit smoking about 8 years ago, she does not drink alcohol, she does not drink caffeine on a daily basis. She has a history of recurrent strep throat and tonsillitis, was supposed to get her tonsils out at [place] but decided against it.  Her Past Medical History Is Significant For: Past Medical History:  Diagnosis Date  . Coronary artery disease   . Diabetes mellitus without complication ([place])   . GERD (  gastroesophageal reflux disease)   . Hypertension   . MI (myocardial infarction) ([place])   . Peripheral vascular disease ([place])     Her Past Surgical History Is Significant For: Past Surgical History:  Procedure Laterality Date  . ABDOMINAL HYSTERECTOMY  1985  . APPENDECTOMY    . CATARACT EXTRACTION Left 02/20/2018   Dr. Venetia Maxon  . CORONARY STENT PLACEMENT  2015  . ectopic pregnancy--unilateral salpingectomy  1980  . KNEE ARTHROSCOPY Left 03/2016  . unilateral oophorectomy      Her Family History Is Significant For: Family History   Problem Relation Age of Onset  . Aortic stenosis Daughter   . Heart disease Daughter        before age 25  . Hypertension Mother   . Cancer Father   . Hypertension Father   . Thyroid disease Daughter   . Breast cancer Neg Hx     Her Social History Is Significant For: Social History   Socioeconomic History  . Marital status: Single    Spouse name: Not on file  . Number of children: Not on file  . Years of education: Not on file  . Highest education level: Not on file  Occupational History  . Not on file  Tobacco Use  . Smoking status: Former Smoker    Packs/day: 0.25    Years: 45.00    Pack years: 11.25    Types: Cigarettes    Quit date: 01/10/2013    Years since quitting: 6.4  . Smokeless tobacco: Never Used  Substance and Sexual Activity  . Alcohol use: Not Currently    Alcohol/week: 0.0 standard drinks  . Drug use: No  . Sexual activity: Not on file  Other Topics Concern  . Not on file  Social History Narrative  . Not on file   Social Determinants of Health   Financial Resource Strain:   . Difficulty of Paying Living Expenses:   Food Insecurity:   . Worried About Charity fundraiser in the Last Year:   . Arboriculturist in the Last Year:   Transportation Needs:   . Film/video editor (Medical):   Marland Kitchen Lack of Transportation (Non-Medical):   Physical Activity:   . Days of Exercise per Week:   . Minutes of Exercise per Session:   Stress:   . Feeling of Stress :   Social Connections:   . Frequency of Communication with Friends and Family:   . Frequency of Social Gatherings with Friends and Family:   . Attends Religious Services:   . Active Member of Clubs or Organizations:   . Attends Archivist Meetings:   Marland Kitchen Marital Status:     Her Allergies Are:  Allergies  Allergen Reactions  . Dust Mite Extract Other (See Comments)    REACTION: SNEEZING  . Other Other (See Comments)    ALLERGEN: DEODORIZERS SPRAY AND PERFUMES REACTION: SNEEZING   . Allopurinol     Diarrhea and the pt said it made her sugars low  . Pollen Extract Other (See Comments)    Reaction unknown  :   Her Current Medications Are:  Outpatient Encounter Medications as of 06/05/2019  Medication Sig  . amLODipine (NORVASC) 5 MG tablet Take 1 tablet (5 mg total) by mouth daily.  Marland Kitchen aspirin 81 MG tablet Take 81 mg by mouth daily.  . Cholecalciferol (VITAMIN D3) 2000 units TABS Take 1 tablet by mouth daily.  . clopidogrel (PLAVIX) 75 MG tablet Take 1 tablet (75  mg total) by mouth daily.  . colchicine 0.6 MG tablet Take 1 tablet by mouth daily. If you experience a flare, increase to 2 tablets by mouth daily.  Marland Kitchen dexlansoprazole (DEXILANT) 60 MG capsule Take 1 capsule (60 mg total) by mouth daily.  . fluticasone (FLONASE) 50 MCG/ACT nasal spray Place 1 spray into both nostrils daily as needed for allergies or rhinitis.  . Insulin Aspart Prot & Aspart (INSULIN ASP PROT & ASP FLEXPEN) (70-30) 100 UNIT/ML SUPN INJECT SUBCUTANEOUS 40 UNITS IN THE MORNING AND 45 UNITS AT BEDTIME  . Insulin Pen Needle (BD PEN NEEDLE NANO U/F) 32G X 4 MM MISC USE WITH INSULIN PEN AS DIRECTED TWICE DAILY  . metoprolol succinate (TOPROL-XL) 100 MG 24 hr tablet Take 1 tablet (100 mg total) by mouth daily. Take with or immediately following a meal.  . naproxen (NAPROSYN) 500 MG tablet Take 500 mg by mouth 2 (two) times daily as needed for moderate pain.  . nitroGLYCERIN (NITROSTAT) 0.4 MG SL tablet Place 1 tablet (0.4 mg total) under the tongue every 5 (five) minutes as needed for chest pain.  Marland Kitchen olmesartan (BENICAR) 40 MG tablet TAKE 1 TABLET(40 MG) BY MOUTH DAILY  . ONETOUCH VERIO test strip Use as instructed to check blood sugars 2 times per day dx: e11.65  . rosuvastatin (CRESTOR) 5 MG tablet Take 1 tablet (5 mg total) by mouth daily. (Patient taking differently: Take 5 mg by mouth daily. 2 days per week)  . Semaglutide,0.25 or 0.5MG/DOS, (OZEMPIC, 0.25 OR 0.5 MG/DOSE,) 2 MG/1.5ML SOPN Inject 0.5  mg into the skin once a week.  . spironolactone-hydrochlorothiazide (ALDACTAZIDE) 25-25 MG tablet Take 1 tablet by mouth daily.   No facility-administered encounter medications on file as of 06/05/2019.  :  Review of Systems:  Out of a complete 14 point review of systems, all are reviewed and negative with the exception of these symptoms as listed below: Review of Systems  Neurological:       Pt presents today to discuss her sleep study results and recommendations.    Objective:  Neurological Exam  Physical Exam Physical Examination:   Vitals:   06/05/19 1453  BP: (!) 148/70  Pulse: 77   General Examination: The patient is a very pleasant 74 y.o. female in no acute distress. She appears well-developed and well-nourished and well groomed.   HEENT: Normocephalic, atraumatic, pupils are equal, round and reactive to light, extraocular tracking is good without limitation to gaze excursion or nystagmus noted. Hearing is grossly intact. Face is symmetric with normal facial animation. Speech is clear with no dysarthria noted. There is no hypophonia. There is no lip, neck/head, jaw or voice tremor. Neck is supple with full range of passive and active motion. Airway exam reveals: mild mouth dryness, adequate dental hygiene with several missing teeth, and moderate airway crowding. Tongue protrudes centrally and palate elevates symmetrically.  Chest: Clear to auscultation without wheezing, rhonchi or crackles noted.  Heart: S1+S2+0, regular and normal without murmurs, rubs or gallops noted.   Abdomen: Soft, non-tender and non-distended with normal bowel sounds appreciated on auscultation.  Extremities: There is 1+ pitting edema around the ankles.   Skin: Warm and dry without trophic changes noted.   Musculoskeletal: exam reveals no obvious joint deformities, tenderness or joint swelling or erythema.   Neurologically:  Mental status: The patient is awake, alert and oriented in all 4  spheres. Her immediate and remote memory, attention, language skills and fund of knowledge are appropriate. There is  no evidence of aphasia, agnosia, apraxia or anomia. Speech is clear with normal prosody and enunciation. Thought process is linear. Mood is normal and affect is normal.  Cranial nerves II - XII are as described above under HEENT exam.  Motor exam: Normal bulk, strength and tone is noted. There is no tremor. Fine motor skills and coordination: grossly intact.  Cerebellar testing: No dysmetria or intention tremor. There is no truncal or gait ataxia.  Sensory exam: intact to light touch in the upper and lower extremities.  Gait, station and balance: She stands easily. No veering to one side is noted. No leaning to one side is noted. Posture is age-appropriate and stance is narrow based. Gait shows normal stride length and normal pace. No problems turning are noted.   Assessment and Plan:  In summary, Baby Hortence Younce is a very pleasant 74 year old female with an underlying medical history of back pain, gout, coronary artery disease with history of MI, hypertension, peripheral vascular disease, reflux disease, diabetes and obesity, who presents for follow-up consultation of her obstructive sleep apnea after interim testing.  Her baseline sleep study from 05/18/2019 showed overall mild obstructive sleep apnea with an AHI of 13.1/h, sleep apnea became more severe as she was in dream sleep.  Her REM AHI was 30.4/h, O2 nadir was 81% during REM sleep.  Given her medical history and her sleep-related complaint of daytime somnolence I had suggested that she proceed with a second sleep study for CPAP titration.  Today, we talked about her test results in detail and treatment options.  She is advised that we could also proceed with treatment at home with an AutoPap machine.  She declines coming in for CPAP titration study and on AutoPap treatment.  She may not be a very good candidate for a dental  device.  She has not tolerated partial dentures in the past.  However, she is advised that I could make a referral to a dentist.  We talked about potential surgical options but these are typically not first-line.  She is advised to continue to pursue weight loss and discuss weight loss with her primary care physician, as well as treatment recommendation for her mild sleep apnea from her primary care physician standpoint.  She would like to discuss this with her as well.  If she changes her mind regarding coming in for a second sleep study for CPAP titration or using AutoPap therapy at home she is encouraged to call, also if she would like to have a dental referral for sleep apnea treatment with a dental device I would be happy to make a referral.  For now, she is advised to follow-up in sleep clinic as needed.  I answered all her questions today and she was in agreement.   I spent 30 minutes in total face-to-face time and in reviewing records during pre-charting, more than 50% of which was spent in counseling and coordination of care, reviewing test results, reviewing medications and treatment regimen and/or in discussing or reviewing the diagnosis of OSA, the prognosis and treatment options. Pertinent laboratory and imaging test results that were available during this visit with the patient were reviewed by me and considered in my medical decision making (see chart for details).

## 2019-06-11 ENCOUNTER — Other Ambulatory Visit: Payer: BC Managed Care – PPO | Admitting: *Deleted

## 2019-06-11 ENCOUNTER — Other Ambulatory Visit: Payer: Self-pay

## 2019-06-11 DIAGNOSIS — E785 Hyperlipidemia, unspecified: Secondary | ICD-10-CM

## 2019-06-12 ENCOUNTER — Telehealth: Payer: Self-pay | Admitting: Pharmacist

## 2019-06-12 LAB — LIPID PANEL
Chol/HDL Ratio: 3.8 ratio (ref 0.0–4.4)
Cholesterol, Total: 144 mg/dL (ref 100–199)
HDL: 38 mg/dL — ABNORMAL LOW (ref >39)
LDL Chol Calc (NIH): 83 mg/dL (ref 0–99)
Triglycerides: 131 mg/dL (ref 0–149)
VLDL Cholesterol Cal: 23 mg/dL (ref 5–40)

## 2019-06-12 LAB — HEPATIC FUNCTION PANEL
ALT: 20 IU/L (ref 0–32)
AST: 13 IU/L (ref 0–40)
Albumin: 4.3 g/dL (ref 3.7–4.7)
Alkaline Phosphatase: 90 IU/L (ref 39–117)
Bilirubin Total: 0.5 mg/dL (ref 0.0–1.2)
Bilirubin, Direct: 0.16 mg/dL (ref 0.00–0.40)
Total Protein: 7.2 g/dL (ref 6.0–8.5)

## 2019-06-12 LAB — LDL CHOLESTEROL, DIRECT: LDL Direct: 83 mg/dL (ref 0–99)

## 2019-06-12 NOTE — Telephone Encounter (Signed)
Reviewed lipid results with patient. LDL down to 83 from 109 on rosuvastatin 5mg  twice a week. Patient states he body aches- however pain was not better off of rosuvastatin and her pain is improved with gabapentin which tells me that pain is nerve pain and not muscle pain. She asked about OMEGA XL- states she use to take for her pain and it helped. States Dr. told her after her heart attack she couldn't take bc she was on plavix. I looked the item up. Looks like it is omega fatty acids. Although I question the efficacy, I do not think that it would significantly increase her risk of bleeding.  Patient is agreeable to increasing rosuvastatin to 5mg  daily. Will follow up in 2 months.

## 2019-06-18 ENCOUNTER — Other Ambulatory Visit: Payer: Self-pay | Admitting: Internal Medicine

## 2019-06-20 ENCOUNTER — Other Ambulatory Visit: Payer: Self-pay | Admitting: *Deleted

## 2019-06-20 DIAGNOSIS — I739 Peripheral vascular disease, unspecified: Secondary | ICD-10-CM

## 2019-06-27 ENCOUNTER — Telehealth (HOSPITAL_COMMUNITY): Payer: Self-pay

## 2019-06-27 NOTE — Telephone Encounter (Signed)

## 2019-06-28 ENCOUNTER — Other Ambulatory Visit: Payer: Self-pay

## 2019-06-28 ENCOUNTER — Ambulatory Visit (HOSPITAL_COMMUNITY)
Admission: RE | Admit: 2019-06-28 | Discharge: 2019-06-28 | Disposition: A | Discharge status: Home / Self Care | Payer: BC Managed Care – PPO | Source: Ambulatory Visit | Attending: Vascular Surgery | Admitting: Vascular Surgery

## 2019-06-28 ENCOUNTER — Encounter: Payer: Self-pay | Admitting: Vascular Surgery

## 2019-06-28 ENCOUNTER — Ambulatory Visit: Payer: BC Managed Care – PPO | Admitting: Vascular Surgery

## 2019-06-28 VITALS — BP 146/81 | HR 63 | Temp 97.2°F | Resp 16 | Ht 65.0 in | Wt 207.0 lb

## 2019-06-28 DIAGNOSIS — I739 Peripheral vascular disease, unspecified: Secondary | ICD-10-CM | POA: Insufficient documentation

## 2019-06-28 NOTE — Progress Notes (Signed)
Patient name: Dawn Wood MRN: [id number] DOB: 06-24-1945 Sex: female  HPI: Dawn Wood is a 74 y.o. female, with a known history of peripheral arterial disease and a chronic left superficial femoral artery occlusion.  She does not really have any claudication symptoms in her left calf.  She states she has been having some pain in her lateral left thigh that occurs about once a week but does not really seem to be consistent in its appearance.  Also recently she has had bilateral ankle swelling.  She is on Norvasc.  Both ankles are swollen the right is slightly worse than the left.  She does not really have any pain.  Other medical problems include coronary artery disease, diabetes, hypertension all of which have been stable.  She has discussed with her cardiologist the possibility of coming off Norvasc to help with the leg swelling.  Past Medical History:  Diagnosis Date  . Coronary artery disease   . Diabetes mellitus without complication (HCC)   . GERD (gastroesophageal reflux disease)   . Hypertension   . MI (myocardial infarction) (HCC)   . Peripheral vascular disease [facility])    Past Surgical History:  Procedure Laterality Date  . ABDOMINAL HYSTERECTOMY  1985  . APPENDECTOMY    . CATARACT EXTRACTION Left 02/20/2018   Dr. Harlon Flor  . CORONARY STENT PLACEMENT  2015  . ectopic pregnancy--unilateral salpingectomy  1980  . KNEE ARTHROSCOPY Left 03/2016  . unilateral oophorectomy      Family History  Problem Relation Age of Onset  . Aortic stenosis Daughter   . Heart disease Daughter        before age 49  . Hypertension Mother   . Cancer Father   . Hypertension Father   . Thyroid disease Daughter   . Breast cancer Neg Hx     SOCIAL HISTORY: Social History   Socioeconomic History  . Marital status: Single    Spouse name: Not on file  . Number of children: Not on file  . Years of education: Not on file  . Highest education level: Not on file    Occupational History  . Not on file  Tobacco Use  . Smoking status: Former Smoker    Packs/day: 0.25    Years: 45.00    Pack years: 11.25    Types: Cigarettes    Quit date: 01/10/2013    Years since quitting: 6.4  . Smokeless tobacco: Never Used  Substance and Sexual Activity  . Alcohol use: Not Currently    Alcohol/week: 0.0 standard drinks  . Drug use: No  . Sexual activity: Not on file  Other Topics Concern  . Not on file  Social History Narrative  . Not on file   Social Determinants of Health   Financial Resource Strain:   . Difficulty of Paying Living Expenses:   Food Insecurity:   . Worried About Programme researcher, broadcasting/film/video in the Last Year:   . Barista in the Last Year:   Transportation Needs:   . Freight forwarder (Medical):   Marland Kitchen Lack of Transportation (Non-Medical):   Physical Activity:   . Days of Exercise per Week:   . Minutes of Exercise per Session:   Stress:   . Feeling of Stress :   Social Connections:   . Frequency of Communication with Friends and Family:   . Frequency of Social Gatherings with Friends and Family:   . Attends Religious Services:   . Active  Member of Clubs or Organizations:   . Attends Banker Meetings:   Marland Kitchen Marital Status:   Intimate Partner Violence:   . Fear of Current or Ex-Partner:   . Emotionally Abused:   Marland Kitchen Physically Abused:   . Sexually Abused:     Allergies  Allergen Reactions  . Dust Mite Extract Other (See Comments)    REACTION: SNEEZING  . Other Other (See Comments)    ALLERGEN: DEODORIZERS SPRAY AND PERFUMES REACTION: SNEEZING  . Allopurinol     Diarrhea and the pt said it made her sugars low  . Pollen Extract Other (See Comments)    Reaction unknown    Current Outpatient Medications  Medication Sig Dispense Refill  . amLODipine (NORVASC) 5 MG tablet Take 1 tablet (5 mg total) by mouth daily. 90 tablet 3  . aspirin 81 MG tablet Take 81 mg by mouth daily.    . Cholecalciferol (VITAMIN D3)  2000 units TABS Take 1 tablet by mouth daily.    . clopidogrel (PLAVIX) 75 MG tablet Take 1 tablet (75 mg total) by mouth daily. 90 tablet 3  . colchicine 0.6 MG tablet Take 1 tablet by mouth daily. If you experience a flare, increase to 2 tablets by mouth daily. 60 tablet 2  . dexlansoprazole (DEXILANT) 60 MG capsule Take 1 capsule (60 mg total) by mouth daily. 30 capsule 1  . fluticasone (FLONASE) 50 MCG/ACT nasal spray Place 1 spray into both nostrils daily as needed for allergies or rhinitis. 16 g 2  . Insulin Aspart Prot & Aspart (INSULIN ASP PROT & ASP FLEXPEN) (70-30) 100 UNIT/ML SUPN INJECT SUBCUTANEOUS 40 UNITS IN THE MORNING AND 45 UNITS AT BEDTIME 75 mL 2  . Insulin Pen Needle (BD PEN NEEDLE NANO U/F) 32G X 4 MM MISC USE WITH INSULIN PEN AS DIRECTED TWICE DAILY 100 each 2  . metoprolol succinate (TOPROL-XL) 100 MG 24 hr tablet TAKE 1 TABLET BY MOUTH DAILY IMMEDIATELY FOLLOWING A MEAL 30 tablet 5  . nitroGLYCERIN (NITROSTAT) 0.4 MG SL tablet Place 1 tablet (0.4 mg total) under the tongue every 5 (five) minutes as needed for chest pain. 25 tablet 3  . olmesartan (BENICAR) 40 MG tablet TAKE 1 TABLET(40 MG) BY MOUTH DAILY 90 tablet 2  . ONETOUCH VERIO test strip Use as instructed to check blood sugars 2 times per day dx: e11.65 100 strip 3  . rosuvastatin (CRESTOR) 5 MG tablet Take 1 tablet (5 mg total) by mouth daily. (Patient taking differently: Take 5 mg by mouth daily. 2 days per week) 30 tablet 11  . Semaglutide,0.25 or 0.5MG /DOS, (OZEMPIC, 0.25 OR 0.5 MG/DOSE,) 2 MG/1.5ML SOPN Inject 0.5 mg into the skin once a week. 15 mL 2  . spironolactone-hydrochlorothiazide (ALDACTAZIDE) 25-25 MG tablet Take 1 tablet by mouth daily. 90 tablet 3  . naproxen (NAPROSYN) 500 MG tablet Take 500 mg by mouth 2 (two) times daily as needed for moderate pain.     No current facility-administered medications for this visit.    ROS:   General:  No weight loss, Fever, chills  Neurologic: No dizziness,  blackouts, seizures. No recent symptoms of stroke or mini- stroke. No recent episodes of slurred speech, or temporary blindness.  Cardiac: No recent episodes of chest pain/pressure, no shortness of breath at rest.  No shortness of breath with exertion.  Denies history of atrial fibrillation or irregular heartbeat  Pulmonary: No home oxygen, no productive cough, no hemoptysis,  No asthma or wheezing  Physical Examination  Vitals:   06/28/19 1428  BP: (!) 146/81  Pulse: 63  Resp: 16  Temp: (!) 97.2 F (36.2 C)  TempSrc: Temporal  SpO2: 98%  Weight: 207 lb (93.9 kg)  Height: 5\' 5"  (1.651 m)    Body mass index is 34.45 kg/m.  General:  Alert and oriented, no acute distress HEENT: Normal Neck: No JVD Cardiac: Regular Rate and Rhythm Skin: No rash Extremity Pulses:  2+ radial, brachial, femoral, absent dorsalis pedis, posterior tibial pulses bilaterally Musculoskeletal: No deformity 1+ bilateral right greater than left ankle edema  Neurologic: Upper and lower extremity motor 5/5 and symmetric  DATA:  Patient had bilateral ABIs performed today which I reviewed and interpreted.  Right side was 0.98 with triphasic waveforms, left side was 0.62 with monophasic waveforms all consistent with her previous ABIs dating back to 2016.  ASSESSMENT: Peripheral arterial disease left leg superficial femoral artery occlusion mild to minimally symptomatic.  Today she also has bilateral ankle swelling.   PLAN: Patient was encouraged to walk daily to improve her peripheral arterial disease symptoms.  I also discussed with her leg elevation and possible compression stockings to control the swelling in her legs.  She is also going to have further discussions with her cardiologist about whether or not she can switch Norvasc to a different antihypertensive.  She will follow-up in our APP clinic in 1 year with bilateral ABIs.  She will follow-up sooner if she has worsening symptoms.   Ruta Hinds, MD [facility] Office: [phone] Pager: [phone]    Data: She has had nearly no decline in her ABIs since 2016.  Her ABI on the left foot has been consistently 0.6 right side 1

## 2019-07-03 ENCOUNTER — Other Ambulatory Visit: Payer: Self-pay | Admitting: *Deleted

## 2019-07-03 DIAGNOSIS — I739 Peripheral vascular disease, unspecified: Secondary | ICD-10-CM

## 2019-08-20 ENCOUNTER — Other Ambulatory Visit: Payer: Self-pay | Admitting: Internal Medicine

## 2019-09-04 ENCOUNTER — Telehealth: Payer: Self-pay

## 2019-09-04 DIAGNOSIS — E785 Hyperlipidemia, unspecified: Secondary | ICD-10-CM

## 2019-09-04 NOTE — Telephone Encounter (Signed)
Called and lmomed the pt to call back to schedule fasting lipid panel, orders placed, will await callback

## 2019-09-04 NOTE — Telephone Encounter (Signed)
-----   Message from Olene Floss, RPH-CPP sent at 09/04/2019  7:05 AM EDT -----  ----- Message ----- From: Olene Floss, RPH-CPP Sent: 09/04/2019 To: Olene Floss, RPH  Set up lipid labs

## 2019-09-10 ENCOUNTER — Other Ambulatory Visit: Payer: Self-pay | Admitting: Internal Medicine

## 2019-09-17 ENCOUNTER — Encounter: Payer: BC Managed Care – PPO | Admitting: Internal Medicine

## 2019-10-03 ENCOUNTER — Other Ambulatory Visit: Payer: Self-pay

## 2019-10-03 MED ORDER — NITROGLYCERIN 0.4 MG SL SUBL: 0.4000 mg | SUBLINGUAL_TABLET | SUBLINGUAL | 3 refills | Status: DC | PRN

## 2019-10-04 ENCOUNTER — Encounter: Payer: Self-pay | Admitting: Internal Medicine

## 2019-10-04 ENCOUNTER — Other Ambulatory Visit: Payer: Self-pay

## 2019-10-04 ENCOUNTER — Ambulatory Visit: Payer: BC Managed Care – PPO | Admitting: Internal Medicine

## 2019-10-04 ENCOUNTER — Other Ambulatory Visit: Payer: Self-pay | Admitting: Internal Medicine

## 2019-10-04 VITALS — BP 138/74 | HR 70 | Temp 98.0°F | Ht 64.0 in | Wt 207.8 lb

## 2019-10-04 DIAGNOSIS — E559 Vitamin D deficiency, unspecified: Secondary | ICD-10-CM

## 2019-10-04 DIAGNOSIS — Z0000 Encounter for general adult medical examination without abnormal findings: Secondary | ICD-10-CM | POA: Diagnosis not present

## 2019-10-04 DIAGNOSIS — R3915 Urgency of urination: Secondary | ICD-10-CM | POA: Diagnosis not present

## 2019-10-04 DIAGNOSIS — H6123 Impacted cerumen, bilateral: Secondary | ICD-10-CM | POA: Diagnosis not present

## 2019-10-04 DIAGNOSIS — E1122 Type 2 diabetes mellitus with diabetic chronic kidney disease: Secondary | ICD-10-CM

## 2019-10-04 DIAGNOSIS — N182 Chronic kidney disease, stage 2 (mild): Secondary | ICD-10-CM

## 2019-10-04 DIAGNOSIS — M25472 Effusion, left ankle: Secondary | ICD-10-CM

## 2019-10-04 DIAGNOSIS — M25471 Effusion, right ankle: Secondary | ICD-10-CM

## 2019-10-04 DIAGNOSIS — E6609 Other obesity due to excess calories: Secondary | ICD-10-CM | POA: Insufficient documentation

## 2019-10-04 DIAGNOSIS — I1310 Hypertensive heart and chronic kidney disease without heart failure, with stage 1 through stage 4 chronic kidney disease, or unspecified chronic kidney disease: Secondary | ICD-10-CM

## 2019-10-04 DIAGNOSIS — I2510 Atherosclerotic heart disease of native coronary artery without angina pectoris: Secondary | ICD-10-CM | POA: Diagnosis not present

## 2019-10-04 DIAGNOSIS — Z794 Long term (current) use of insulin: Secondary | ICD-10-CM | POA: Insufficient documentation

## 2019-10-04 DIAGNOSIS — M791 Myalgia, unspecified site: Secondary | ICD-10-CM

## 2019-10-04 LAB — POCT URINALYSIS DIPSTICK
Bilirubin, UA: NEGATIVE
Blood, UA: NEGATIVE
Glucose, UA: NEGATIVE
Ketones, UA: NEGATIVE
Leukocytes, UA: NEGATIVE
Nitrite, UA: NEGATIVE
Protein, UA: NEGATIVE
Spec Grav, UA: 1.015 (ref 1.010–1.025)
Urobilinogen, UA: 0.2 E.U./dL
pH, UA: 5.5 (ref 5.0–8.0)

## 2019-10-04 LAB — POCT UA - MICROALBUMIN
Albumin/Creatinine Ratio, Urine, POC: <30
Creatinine, POC: 50 mg/dL
Microalbumin Ur, POC: 10 mg/L

## 2019-10-04 NOTE — Progress Notes (Signed)
I,Katawbba Wiggins,acting as a Education administrator for [name], MD.,have documented all relevant documentation on the behalf of [name], MD,as directed by  [name], MD while in the presence of [name], MD.  This visit occurred during the SARS-CoV-2 public health emergency.  Safety protocols were in place, including screening questions prior to the visit, additional usage of staff PPE, and extensive cleaning of exam room while observing appropriate contact time as indicated for disinfecting solutions.  Subjective:     Patient ID: Dawn Wood , female    DOB: 01-20-1946 , 74 y.o.   MRN: [id number]   HPI  SHE IS HERE TODAY FOR A FULL PHYSICAL EXAMINATION. SHE HAS NO SPECIFIC CONCERNS AT THIS TIME.   Diabetes She presents for her follow-up diabetic visit. She has type 2 diabetes mellitus. Her disease course has been stable. There are no hypoglycemic associated symptoms. There are no diabetic associated symptoms. There are no hypoglycemic complications. Symptoms are stable. Diabetic complications include heart disease and PVD. Risk factors for coronary artery disease include hypertension, dyslipidemia, diabetes mellitus, post-menopausal and sedentary lifestyle.  Hypertension This is a chronic problem. The current episode started more than 1 year ago. The problem has been gradually improving since onset. The problem is controlled. Hypertensive end-organ damage includes PVD.     Past Medical History:  Diagnosis Date  . Coronary artery disease   . Diabetes mellitus without complication ([place])   . GERD (gastroesophageal reflux disease)   . Hypertension   . MI (myocardial infarction) ([place])   . Peripheral vascular disease ([place])      Family History  Problem Relation Age of Onset  . Aortic stenosis Daughter   . Heart disease Daughter        before age 31  . Hypertension Mother   . Cancer Father   . Hypertension Father   . Thyroid disease Daughter   . Breast  cancer Neg Hx      Current Outpatient Medications:  .  amLODipine (NORVASC) 5 MG tablet, Take 1 tablet (5 mg total) by mouth daily., Disp: 90 tablet, Rfl: 3 .  aspirin 81 MG tablet, Take 81 mg by mouth daily., Disp: , Rfl:  .  BD PEN NEEDLE NANO 2ND GEN 32G X 4 MM MISC, USE WITH INSULIN PEN TWICE DAILY AS DIRECTED, Disp: 100 each, Rfl: 2 .  Cholecalciferol (VITAMIN D3) 2000 units TABS, Take 1 tablet by mouth daily., Disp: , Rfl:  .  clopidogrel (PLAVIX) 75 MG tablet, Take 1 tablet (75 mg total) by mouth daily., Disp: 90 tablet, Rfl: 3 .  colchicine 0.6 MG tablet, Take 1 tablet by mouth daily. If you experience a flare, increase to 2 tablets by mouth daily., Disp: 60 tablet, Rfl: 2 .  dexlansoprazole (DEXILANT) 60 MG capsule, Take 1 capsule (60 mg total) by mouth daily., Disp: 30 capsule, Rfl: 1 .  fluticasone (FLONASE) 50 MCG/ACT nasal spray, Place 1 spray into both nostrils daily as needed for allergies or rhinitis., Disp: 16 g, Rfl: 2 .  Insulin Aspart Prot & Aspart (INSULIN ASP PROT & ASP FLEXPEN) (70-30) 100 UNIT/ML SUPN, INJECT SUBCUTANEOUS 40 UNITS IN THE MORNING AND 45 UNITS AT BEDTIME, Disp: 75 mL, Rfl: 2 .  metoprolol succinate (TOPROL-XL) 100 MG 24 hr tablet, TAKE 1 TABLET BY MOUTH DAILY IMMEDIATELY FOLLOWING A MEAL, Disp: 30 tablet, Rfl: 5 .  olmesartan (BENICAR) 40 MG tablet, TAKE 1 TABLET(40 MG) BY MOUTH DAILY, Disp: 90 tablet, Rfl: 2 .  ONETOUCH VERIO test strip, Use as instructed to check blood sugars 2 times per day dx: e11.65, Disp: 100 strip, Rfl: 3 .  rosuvastatin (CRESTOR) 5 MG tablet, Take 1 tablet (5 mg total) by mouth daily. (Patient taking differently: Take 5 mg by mouth daily. 2 days per week), Disp: 30 tablet, Rfl: 11 .  Semaglutide,0.25 or 0.5MG/DOS, (OZEMPIC, 0.25 OR 0.5 MG/DOSE,) 2 MG/1.5ML SOPN, Inject 0.5 mg into the skin once a week., Disp: 15 mL, Rfl: 2 .  spironolactone-hydrochlorothiazide (ALDACTAZIDE) 25-25 MG tablet, Take 1 tablet by mouth daily., Disp: 90  tablet, Rfl: 3 .  nitroGLYCERIN (NITROSTAT) 0.4 MG SL tablet, Place 1 tablet (0.4 mg total) under the tongue every 5 (five) minutes as needed for chest pain., Disp: 25 tablet, Rfl: 3   Allergies  Allergen Reactions  . Dust Mite Extract Other (See Comments)    REACTION: SNEEZING  . Other Other (See Comments)    ALLERGEN: DEODORIZERS SPRAY AND PERFUMES REACTION: SNEEZING  . Allopurinol     Diarrhea and the pt said it made her sugars low  . Pollen Extract Other (See Comments)    Reaction unknown      The patient states she uses post menopausal status for birth control. Last LMP was No LMP recorded. Patient has had a hysterectomy.. Negative for Dysmenorrhea. Negative for: breast discharge, breast lump(s), breast pain and breast self exam. Associated symptoms include abnormal vaginal bleeding. Pertinent negatives include abnormal bleeding (hematology), anxiety, decreased libido, depression, difficulty falling sleep, dyspareunia, history of infertility, nocturia, sexual dysfunction, sleep disturbances, urinary incontinence, urinary urgency, vaginal discharge and vaginal itching. Diet regular.The patient states her exercise level is Current Exercise Habits: The patient does not participate in regular exercise at present Exercise limited by: Other - see comments (the pt said she doesn't exercise because her hips hurt and her back hurt.). The patient's tobacco use is:  Social History   Tobacco Use  Smoking Status Former Smoker  . Packs/day: 0.25  . Years: 45.00  . Pack years: 11.25  . Types: Cigarettes  . Quit date: 01/10/2013  . Years since quitting: 6.7  Smokeless Tobacco Never Used  . She has been exposed to passive smoke. The patient's alcohol use is:  Social History   Substance and Sexual Activity  Alcohol Use Not Currently  . Alcohol/week: 0.0 standard drinks     Review of Systems  Constitutional: Negative.   HENT: Negative.   Eyes: Negative.   Respiratory: Negative.    Cardiovascular: Positive for leg swelling.  Gastrointestinal: Negative.   Endocrine: Negative.   Genitourinary: Negative.   Musculoskeletal: Positive for myalgias (she c/o diffuse muscle aches. ).  Skin: Negative.   Allergic/Immunologic: Negative.   Neurological: Negative.   Hematological: Negative.   Psychiatric/Behavioral: Negative.      Today's Vitals   10/04/19 1017  BP: (!) 138/74  Pulse: 70  Temp: 98 F (36.7 C)  TempSrc: Oral  Weight: (!) 207 lb 12.8 oz (94.3 kg)  Height: 5' 4" (1.626 m)   Body mass index is 35.67 kg/m.  Wt Readings from Last 3 Encounters:  10/04/19 (!) 207 lb 12.8 oz (94.3 kg)  06/28/19 207 lb (93.9 kg)  06/05/19 204 lb (92.5 kg)   Objective:  Physical Exam Constitutional:      General: She is not in acute distress.    Appearance: Normal appearance. She is well-developed. She is obese.  HENT:     Head: Normocephalic and atraumatic.     Right Ear: Hearing,  ear canal and external ear normal. There is impacted cerumen.     Left Ear: Hearing, ear canal and external ear normal. There is impacted cerumen.     Nose: Nose normal.     Mouth/Throat:     Mouth: Mucous membranes are dry.  Eyes:     General: Lids are normal.     Extraocular Movements: Extraocular movements intact.     Conjunctiva/sclera: Conjunctivae normal.     Pupils: Pupils are equal, round, and reactive to light.     Funduscopic exam:    Right eye: No papilledema.        Left eye: No papilledema.  Neck:     Thyroid: No thyroid mass.     Vascular: No carotid bruit.  Cardiovascular:     Rate and Rhythm: Normal rate and regular rhythm.     Pulses:          Dorsalis pedis pulses are 2+ on the right side and 2+ on the left side.     Heart sounds: Normal heart sounds. No murmur heard.   Pulmonary:     Effort: Pulmonary effort is normal.     Breath sounds: Normal breath sounds.  Chest:     Breasts: Tanner Score is 5.        Left: Normal.       Comments: R breast has round,  nontender mass above areola, about 12 oclock  Abdominal:     General: Abdomen is flat. Bowel sounds are normal. There is no distension.     Palpations: Abdomen is soft.     Tenderness: There is no abdominal tenderness.  Genitourinary:    Rectum: Guaiac result negative.  Musculoskeletal:        General: No swelling. Normal range of motion.     Cervical back: Full passive range of motion without pain, normal range of motion and neck supple.     Right lower leg: 1+ Pitting Edema present.     Left lower leg: 1+ Pitting Edema present.  Feet:     Right foot:     Protective Sensation: 5 sites tested. 3 sites sensed.     Skin integrity: Callus and dry skin present.     Toenail Condition: Right toenails are abnormally thick.     Left foot:     Protective Sensation: 5 sites tested. 3 sites sensed.     Skin integrity: Callus and dry skin present.     Toenail Condition: Left toenails are abnormally thick.  Skin:    General: Skin is warm and dry.     Capillary Refill: Capillary refill takes less than 2 seconds.  Neurological:     General: No focal deficit present.     Mental Status: She is alert and oriented to person, place, and time.     Cranial Nerves: No cranial nerve deficit.     Sensory: No sensory deficit.  Psychiatric:        Mood and Affect: Mood normal.        Behavior: Behavior normal.        Thought Content: Thought content normal.        Judgment: Judgment normal.         Assessment And Plan:     1. Routine general medical examination at a health care facility Comments: A full exam was performed. Importance of monthly self breast exams was discussed with the patient. PATIENT IS ADVISED TO GET 30-45 MINUTES REGULAR EXERCISE NO LESS THAN FOUR TO  FIVE DAYS PER WEEK - BOTH WEIGHTBEARING EXERCISES AND AEROBIC ARE RECOMMENDED.  PATIENT IS ADVISED TO FOLLOW A HEALTHY DIET WITH AT LEAST SIX FRUITS/VEGGIES PER DAY, DECREASE INTAKE OF RED MEAT, AND TO INCREASE FISH INTAKE TO TWO DAYS  PER WEEK.  MEATS/FISH SHOULD NOT BE FRIED, BAKED OR BROILED IS PREFERABLE.  I SUGGEST WEARING SPF 50 SUNSCREEN ON EXPOSED PARTS AND ESPECIALLY WHEN IN THE DIRECT SUNLIGHT FOR AN EXTENDED PERIOD OF TIME.  PLEASE AVOID FAST FOOD RESTAURANTS AND INCREASE YOUR WATER INTAKE.  - Hemoglobin A1c - VITAMIN D 25 Hydroxy (Vit-D Deficiency, Fractures) - CBC - CMP14+EGFR - Lipid panel  2. Type 2 diabetes mellitus with stage 2 chronic kidney disease, with long-term current use of insulin (HCC) Comments: Diabetic foot exam was performed.  I DISCUSSED WITH THE PATIENT AT LENGTH REGARDING THE GOALS OF GLYCEMIC CONTROL AND POSSIBLE LONG-TERM COMPLICATIONS.  I  ALSO STRESSED THE IMPORTANCE OF COMPLIANCE WITH HOME GLUCOSE MONITORING, DIETARY RESTRICTIONS INCLUDING AVOIDANCE OF SUGARY DRINKS/PROCESSED FOODS,  ALONG WITH REGULAR EXERCISE.  I  ALSO STRESSED THE IMPORTANCE OF ANNUAL EYE EXAMS, SELF FOOT CARE AND COMPLIANCE WITH OFFICE VISITS.  - POCT UA - Microalbumin - EKG 12-Lead  3. Hypertensive heart and renal disease with renal failure, stage 1 through stage 4 or unspecified chronic kidney disease, without heart failure Comments: Chronic, fair control. She is aware that optimal BP is less than 130/80.  Encouraged to gradually increase her activity level. EKG performed, NSR w/o acute changes.   4. CAD in native artery Comments: Chronic, yet stable. Encouraged to follow heart healthy lifestyle and to take meds as prescribed.   5. Bilateral impacted cerumen  AFTER OBTAINING VERBAL CONSENT, BOTH EARS WERE FLUSHED BY IRRIGATION. SHE TOLERATED PROCEDURE WELL WITHOUT ANY COMPLICATIONS. NO TM ABNORMALITIES WERE NOTED.  - Ear Lavage  6. Myalgia  Persistent. She feels her sx are due to use of rosuvastatin. I will consider switching her to another statin since she is having recurrent sx. We can also consider adding CoQ10 to her current regimen.  - CK, total  7. Ankle edema, bilateral Comments: Chronic. She wants  to d/c amlodipine due to LE edema. Encouraged to discuss further with her cardiologist. Encouraged to elevate her legs when seated and to cut  8. Vitamin D deficiency disease  I WILL CHECK A VIT D LEVEL AND SUPPLEMENT AS NEEDED.  ALSO ENCOURAGED TO SPEND 15 MINUTES IN THE SUN DAILY.  9. Urinary urgency Comments: I will check urinalysis to r/o UTI. IF negative, will consider rx meds.   - POCT Urinalysis Dipstick (81002)  10. 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 Advised to aim for at least 150 minutes of exercise per week.    Patient was given opportunity to ask questions. Patient verbalized understanding of the plan and was able to repeat key elements of the plan. All questions were answered to their satisfaction.   [name], MD   I, [name], MD, have reviewed all documentation for this visit. The documentation on 10/21/19 for the exam, diagnosis, procedures, and orders are all accurate and complete.  THE PATIENT IS ENCOURAGED TO PRACTICE SOCIAL DISTANCING DUE TO THE COVID-19 PANDEMIC.

## 2019-10-04 NOTE — Patient Instructions (Signed)
Health Maintenance After Age 75 After age 75, you are at a higher risk for certain long-term diseases and infections as well as injuries from falls. Falls are a major cause of broken bones and head injuries in people who are older than age 75. Getting regular preventive care can help to keep you healthy and well. Preventive care includes getting regular testing and making lifestyle changes as recommended by your health care provider. Talk with your health care provider about:  Which screenings and tests you should have. A screening is a test that checks for a disease when you have no symptoms.  A diet and exercise plan that is right for you. What should I know about screenings and tests to prevent falls? Screening and testing are the best ways to find a health problem early. Early diagnosis and treatment give you the best chance of managing medical conditions that are common after age 75. Certain conditions and lifestyle choices may make you more likely to have a fall. Your health care provider may recommend:  Regular vision checks. Poor vision and conditions such as cataracts can make you more likely to have a fall. If you wear glasses, make sure to get your prescription updated if your vision changes.  Medicine review. Work with your health care provider to regularly review all of the medicines you are taking, including over-the-counter medicines. Ask your health care provider about any side effects that may make you more likely to have a fall. Tell your health care provider if any medicines that you take make you feel dizzy or sleepy.  Osteoporosis screening. Osteoporosis is a condition that causes the bones to get weaker. This can make the bones weak and cause them to break more easily.  Blood pressure screening. Blood pressure changes and medicines to control blood pressure can make you feel dizzy.  Strength and balance checks. Your health care provider may recommend certain tests to check your  strength and balance while standing, walking, or changing positions.  Foot health exam. Foot pain and numbness, as well as not wearing proper footwear, can make you more likely to have a fall.  Depression screening. You may be more likely to have a fall if you have a fear of falling, feel emotionally low, or feel unable to do activities that you used to do.  Alcohol use screening. Using too much alcohol can affect your balance and may make you more likely to have a fall. What actions can I take to lower my risk of falls? General instructions  Talk with your health care provider about your risks for falling. Tell your health care provider if: ? You fall. Be sure to tell your health care provider about all falls, even ones that seem minor. ? You feel dizzy, sleepy, or off-balance.  Take over-the-counter and prescription medicines only as told by your health care provider. These include any supplements.  Eat a healthy diet and maintain a healthy weight. A healthy diet includes low-fat dairy products, low-fat (lean) meats, and fiber from whole grains, beans, and lots of fruits and vegetables. Home safety  Remove any tripping hazards, such as rugs, cords, and clutter.  Install safety equipment such as grab bars in bathrooms and safety rails on stairs.  Keep rooms and walkways well-lit. Activity   Follow a regular exercise program to stay fit. This will help you maintain your balance. Ask your health care provider what types of exercise are appropriate for you.  If you need a cane or   walker, use it as recommended by your health care provider.  Wear supportive shoes that have nonskid soles. Lifestyle  Do not drink alcohol if your health care provider tells you not to drink.  If you drink alcohol, limit how much you have: ? 0-1 drink a day for women. ? 0-2 drinks a day for men.  Be aware of how much alcohol is in your drink. In the [place] one drink equals one typical bottle of beer (12  oz), one-half glass of wine (5 oz), or one shot of hard liquor (1 oz).  Do not use any products that contain nicotine or tobacco, such as cigarettes and e-cigarettes. If you need help quitting, ask your health care provider. Summary  Having a healthy lifestyle and getting preventive care can help to protect your health and wellness after age 75.  Screening and testing are the best way to find a health problem early and help you avoid having a fall. Early diagnosis and treatment give you the best chance for managing medical conditions that are more common for people who are older than age 75.  Falls are a major cause of broken bones and head injuries in people who are older than age 75. Take precautions to prevent a fall at home.  Work with your health care provider to learn what changes you can make to improve your health and wellness and to prevent falls. This information is not intended to replace advice given to you by your health care provider. Make sure you discuss any questions you have with your health care provider. Document Revised: 06/15/2018 Document Reviewed: 01/05/2017 Elsevier Patient Education  2020 [employer]  

## 2019-10-05 LAB — HEMOGLOBIN A1C
Est. average glucose Bld gHb Est-mCnc: 157 mg/dL
Hgb A1c MFr Bld: 7.1 % — ABNORMAL HIGH (ref 4.8–5.6)

## 2019-10-05 LAB — CMP14+EGFR
ALT: 16 IU/L (ref 0–32)
AST: 12 IU/L (ref 0–40)
Albumin/Globulin Ratio: 1.3 (ref 1.2–2.2)
Albumin: 4.2 g/dL (ref 3.7–4.7)
Alkaline Phosphatase: 82 IU/L (ref 48–121)
BUN/Creatinine Ratio: 14 (ref 12–28)
BUN: 16 mg/dL (ref 8–27)
Bilirubin Total: 0.7 mg/dL (ref 0.0–1.2)
CO2: 23 mmol/L (ref 20–29)
Calcium: 9.6 mg/dL (ref 8.7–10.3)
Chloride: 102 mmol/L (ref 96–106)
Creatinine, Ser: 1.11 mg/dL — ABNORMAL HIGH (ref 0.57–1.00)
GFR calc Af Amer: 57 mL/min/{1.73_m2} — ABNORMAL LOW (ref >59)
GFR calc non Af Amer: 49 mL/min/{1.73_m2} — ABNORMAL LOW (ref >59)
Globulin, Total: 3.3 g/dL (ref 1.5–4.5)
Glucose: 72 mg/dL (ref 65–99)
Potassium: 4.1 mmol/L (ref 3.5–5.2)
Sodium: 140 mmol/L (ref 134–144)
Total Protein: 7.5 g/dL (ref 6.0–8.5)

## 2019-10-05 LAB — CBC
Hematocrit: 44.1 % (ref 34.0–46.6)
Hemoglobin: 13.5 g/dL (ref 11.1–15.9)
MCH: 27.1 pg (ref 26.6–33.0)
MCHC: 30.6 g/dL — ABNORMAL LOW (ref 31.5–35.7)
MCV: 88 fL (ref 79–97)
Platelets: 253 10*3/uL (ref 150–450)
RBC: 4.99 x10E6/uL (ref 3.77–5.28)
RDW: 13.2 % (ref 11.7–15.4)
WBC: 7.7 10*3/uL (ref 3.4–10.8)

## 2019-10-05 LAB — VITAMIN D 25 HYDROXY (VIT D DEFICIENCY, FRACTURES): Vit D, 25-Hydroxy: 43.8 ng/mL (ref 30.0–100.0)

## 2019-10-05 LAB — CK: Total CK: 190 U/L — ABNORMAL HIGH (ref 32–182)

## 2019-10-09 LAB — LIPID PANEL
Chol/HDL Ratio: 3.3 ratio (ref 0.0–4.4)
Cholesterol, Total: 150 mg/dL (ref 100–199)
HDL: 46 mg/dL (ref >39)
LDL Chol Calc (NIH): 87 mg/dL (ref 0–99)
Triglycerides: 93 mg/dL (ref 0–149)
VLDL Cholesterol Cal: 17 mg/dL (ref 5–40)

## 2019-10-09 LAB — SPECIMEN STATUS REPORT

## 2019-10-22 ENCOUNTER — Other Ambulatory Visit: Payer: Self-pay | Admitting: Internal Medicine

## 2019-10-22 DIAGNOSIS — E1165 Type 2 diabetes mellitus with hyperglycemia: Secondary | ICD-10-CM

## 2019-10-29 ENCOUNTER — Telehealth: Payer: Self-pay

## 2019-10-29 NOTE — Telephone Encounter (Signed)
I returned the pt's call and left her on her voicemail at the pt's request that Dr. Allyne Gee said it's up to the pt about doing the Life Line Screen.

## 2019-12-01 ENCOUNTER — Other Ambulatory Visit: Payer: Self-pay | Admitting: Internal Medicine

## 2019-12-12 ENCOUNTER — Other Ambulatory Visit: Payer: Self-pay

## 2019-12-12 ENCOUNTER — Other Ambulatory Visit: Payer: Self-pay | Admitting: Internal Medicine

## 2019-12-12 MED ORDER — CLOPIDOGREL BISULFATE 75 MG PO TABS: 75.0000 mg | ORAL_TABLET | Freq: Every day | ORAL | 0 refills | Status: DC

## 2019-12-12 NOTE — Telephone Encounter (Signed)
Pt's medication was sent to pt's pharmacy as requested. Confirmation received.  °

## 2019-12-30 ENCOUNTER — Other Ambulatory Visit: Payer: Self-pay | Admitting: Interventional Cardiology

## 2019-12-30 DIAGNOSIS — I10 Essential (primary) hypertension: Secondary | ICD-10-CM

## 2020-01-02 ENCOUNTER — Ambulatory Visit (INDEPENDENT_AMBULATORY_CARE_PROVIDER_SITE_OTHER): Payer: BC Managed Care – PPO | Admitting: Internal Medicine

## 2020-01-02 ENCOUNTER — Other Ambulatory Visit: Payer: Self-pay

## 2020-01-02 ENCOUNTER — Encounter: Payer: Self-pay | Admitting: Internal Medicine

## 2020-01-02 VITALS — BP 130/70 | HR 66 | Temp 98.5°F | Ht 64.0 in | Wt 210.2 lb

## 2020-01-02 DIAGNOSIS — R350 Frequency of micturition: Secondary | ICD-10-CM | POA: Diagnosis not present

## 2020-01-02 DIAGNOSIS — N3281 Overactive bladder: Secondary | ICD-10-CM | POA: Diagnosis not present

## 2020-01-02 DIAGNOSIS — Z2821 Immunization not carried out because of patient refusal: Secondary | ICD-10-CM

## 2020-01-02 DIAGNOSIS — I2510 Atherosclerotic heart disease of native coronary artery without angina pectoris: Secondary | ICD-10-CM | POA: Diagnosis not present

## 2020-01-02 LAB — POCT URINALYSIS DIPSTICK
Bilirubin, UA: NEGATIVE
Blood, UA: NEGATIVE
Glucose, UA: NEGATIVE
Ketones, UA: NEGATIVE
Leukocytes, UA: NEGATIVE
Nitrite, UA: NEGATIVE
Protein, UA: NEGATIVE
Spec Grav, UA: 1.025 (ref 1.010–1.025)
Urobilinogen, UA: 0.2 E.U./dL
pH, UA: 6.5 (ref 5.0–8.0)

## 2020-01-02 NOTE — Progress Notes (Signed)
I,[name],acting as a Neurosurgeon for [name], MD.,have documented all relevant documentation on the behalf of [name], MD,as directed by  [name], MD while in the presence of [name], MD.  This visit occurred during the SARS-CoV-2 public health emergency.  Safety protocols were in place, including screening questions prior to the visit, additional usage of staff PPE, and extensive cleaning of exam room while observing appropriate contact time as indicated for disinfecting solutions.  Subjective:     Patient ID: Dawn Wood , female    DOB: 1945/04/10 , 74 y.o.   MRN: [id number]   Chief Complaint  Patient presents with  . Urinary Frequency    HPI  Urinary Frequency  This is a new problem. The current episode started 1 to 4 weeks ago. The problem occurs every urination. The problem has been unchanged. The pain is at a severity of 0/10. The patient is experiencing no pain. There has been no fever. She is not sexually active. Associated symptoms include frequency and urgency.     Past Medical History:  Diagnosis Date  . Coronary artery disease   . Diabetes mellitus without complication (HCC)   . GERD (gastroesophageal reflux disease)   . Hypertension   . MI (myocardial infarction) (HCC)   . Peripheral vascular disease (HCC)      Family History  Problem Relation Age of Onset  . Aortic stenosis Daughter   . Heart disease Daughter        before age 12  . Hypertension Mother   . Cancer Father   . Hypertension Father   . Thyroid disease Daughter   . Breast cancer Neg Hx      Current Outpatient Medications:  .  amLODipine (NORVASC) 5 MG tablet, Take 1 tablet (5 mg total) by mouth daily., Disp: 90 tablet, Rfl: 3 .  aspirin 81 MG tablet, Take 81 mg by mouth daily., Disp: , Rfl:  .  BD PEN NEEDLE NANO 2ND GEN 32G X 4 MM MISC, USE WITH INSULIN PEN TWICE DAILY AS DIRECTED, Disp: 100 each, Rfl: 2 .  Cholecalciferol (VITAMIN D3) 2000 units  TABS, Take 1 tablet by mouth daily., Disp: , Rfl:  .  clopidogrel (PLAVIX) 75 MG tablet, Take 1 tablet (75 mg total) by mouth daily., Disp: 90 tablet, Rfl: 3 .  colchicine 0.6 MG tablet, Take 1 tablet by mouth daily. If you experience a flare, increase to 2 tablets by mouth daily., Disp: 60 tablet, Rfl: 2 .  dexlansoprazole (DEXILANT) 60 MG capsule, Take 1 capsule (60 mg total) by mouth daily., Disp: 30 capsule, Rfl: 1 .  fluticasone (FLONASE) 50 MCG/ACT nasal spray, Place 1 spray into both nostrils daily as needed for allergies or rhinitis., Disp: 16 g, Rfl: 2 .  metoprolol succinate (TOPROL-XL) 100 MG 24 hr tablet, TAKE 1 TABLET BY MOUTH DAILY IMMEDIATELY FOLLOWING A MEAL, Disp: 90 tablet, Rfl: 1 .  nitroGLYCERIN (NITROSTAT) 0.4 MG SL tablet, Place 1 tablet (0.4 mg total) under the tongue every 5 (five) minutes as needed for chest pain., Disp: 25 tablet, Rfl: 3 .  NOVOLOG MIX 70/30 FLEXPEN (70-30) 100 UNIT/ML FlexPen, INJECT SUBCUTANEOUS 40 UNITS IN THE MORNING AND 45 UNITS AT BEDTIME, Disp: 75 mL, Rfl: 2 .  olmesartan (BENICAR) 40 MG tablet, TAKE 1 TABLET(40 MG) BY MOUTH DAILY, Disp: 90 tablet, Rfl: 2 .  ONETOUCH VERIO test strip, Use as instructed to check blood sugars 2 times per day dx: e11.65, Disp:  100 strip, Rfl: 3 .  OZEMPIC, 0.25 OR 0.5 MG/DOSE, 2 MG/1.5ML SOPN, INJECT 0.5MG  INTO SKIN ONCE A WEEK, Disp: 1.5 mL, Rfl: 1 .  rosuvastatin (CRESTOR) 5 MG tablet, Take 1 tablet (5 mg total) by mouth daily., Disp: 30 tablet, Rfl: 11 .  spironolactone-hydrochlorothiazide (ALDACTAZIDE) 25-25 MG tablet, Take 1 tablet by mouth daily., Disp: 90 tablet, Rfl: 3   Allergies  Allergen Reactions  . Dust Mite Extract Other (See Comments)    REACTION: SNEEZING  . Other Other (See Comments)    ALLERGEN: DEODORIZERS SPRAY AND PERFUMES REACTION: SNEEZING  . Allopurinol     Diarrhea and the pt said it made her sugars low  . Pollen Extract Other (See Comments)    Reaction unknown     Review of Systems   Constitutional: Negative.   Respiratory: Negative.   Cardiovascular: Negative.   Gastrointestinal: Negative.   Genitourinary: Positive for frequency and urgency.  Neurological: Negative.      Today's Vitals   01/02/20 1601  BP: 130/70  Pulse: 66  Temp: 98.5 F (36.9 C)  TempSrc: Oral  Weight: 210 lb 3.2 oz (95.3 kg)  Height: 5\' 4"  (1.626 m)   Body mass index is 36.08 kg/m.   Objective:  Physical Exam Vitals and nursing note reviewed.  Constitutional:      Appearance: Normal appearance.  HENT:     Head: Normocephalic and atraumatic.  Cardiovascular:     Rate and Rhythm: Normal rate and regular rhythm.     Heart sounds: Normal heart sounds.  Pulmonary:     Effort: Pulmonary effort is normal.     Breath sounds: Normal breath sounds.  Skin:    General: Skin is warm.  Neurological:     General: No focal deficit present.     Mental Status: She is alert.  Psychiatric:        Mood and Affect: Mood normal.        Behavior: Behavior normal.         Assessment And Plan:     1. Frequency of urination Comments: I will check urinalysis today to r/o infection.  - POCT Urinalysis Dipstick (81002)  2. OAB (overactive bladder) Comments: She was given samples of Myrbetriq 25mg  to take once daily. Advise to stay well hydrated.   3. Influenza vaccination declined     Patient was given opportunity to ask questions. Patient verbalized understanding of the plan and was able to repeat key elements of the plan. All questions were answered to their satisfaction.  , MD   I, , MD, have reviewed all documentation for this visit. The documentation on 02/03/20 for the exam, diagnosis, procedures, and orders are all accurate and complete.  THE PATIENT IS ENCOURAGED TO PRACTICE SOCIAL DISTANCING DUE TO THE COVID-19 PANDEMIC.

## 2020-01-02 NOTE — Patient Instructions (Signed)
Mirabegron extended-release tablets What is this medicine? MIRABEGRON (MIR a BEG ron) is used to treat overactive bladder. This medicine reduces the amount of bathroom visits. It may also help to control wetting accidents. It may be used alone, but sometimes may be given with other treatments. This medicine may be used for other purposes; ask your health care provider or pharmacist if you have questions. COMMON BRAND NAME(S): Myrbetriq What should I tell my health care provider before I take this medicine? They need to know if you have any of these conditions:  high blood pressure  kidney disease  liver disease  problems urinating  prostate disease  an unusual or allergic reaction to mirabegron, other medicines, foods, dyes, or preservatives  pregnant or trying to get pregnant  breast-feeding How should I use this medicine? Take this medicine by mouth with a glass of water. Follow the directions on the prescription label. Do not cut, crush or chew this medicine. You can take it with or without food. If it upsets your stomach, take it with food. Take your medicine at regular intervals. Do not take it more often than directed. Do not stop taking except on your doctor's advice. Talk to your pediatrician regarding the use of this medicine in children. Special care may be needed. Overdosage: If you think you have taken too much of this medicine contact a poison control center or emergency room at once. NOTE: This medicine is only for you. Do not share this medicine with others. What if I miss a dose? If you miss a dose, take it as soon as you can. If it is almost time for your next dose, take only that dose. Do not take double or extra doses. What may interact with this medicine?  codeine  desipramine  digoxin  flecainide  MAOIs like Carbex, Eldepryl, Marplan, Nardil, and Parnate  methadone  metoprolol  pimozide  propafenone  thioridazine  warfarin This list may not  describe all possible interactions. Give your health care provider a list of all the medicines, herbs, non-prescription drugs, or dietary supplements you use. Also tell them if you smoke, drink alcohol, or use illegal drugs. Some items may interact with your medicine. What should I watch for while using this medicine? Visit your doctor or health care professional for regular checks on your progress. Check your blood pressure as directed. Ask your doctor or health care professional what your blood pressure should be and when you should contact him or her. You may need to limit your intake of tea, coffee, caffeinated sodas, or alcohol. These drinks may make your symptoms worse. What side effects may I notice from receiving this medicine? Side effects that you should report to your doctor or health care professional as soon as possible:  allergic reactions like skin rash, itching or hives, swelling of the face, lips, or tongue  high blood pressure  fast, irregular heartbeat  redness, blistering, peeling or loosening of the skin, including inside the mouth  signs of infection like fever or chills; pain or difficulty passing urine  trouble passing urine or change in the amount of urine Side effects that usually do not require medical attention (report to your doctor or health care professional if they continue or are bothersome):  constipation  dry mouth  headache  runny nose  stomach upset This list may not describe all possible side effects. Call your doctor for medical advice about side effects. You may report side effects to FDA at 1-800-FDA-1088. Where should   I keep my medicine? Keep out of the reach of children. Store at room temperature between 15 and 30 degrees C (59 and 86 degrees F). Throw away any unused medicine after the expiration date. NOTE: This sheet is a summary. It may not cover all possible information. If you have questions about this medicine, talk to your doctor,  pharmacist, or health care provider.  2020 Elsevier/Gold Standard (2016-07-15 11:33:21)  

## 2020-01-03 ENCOUNTER — Telehealth: Payer: Self-pay

## 2020-01-03 LAB — HM DIABETES EYE EXAM

## 2020-01-03 NOTE — Telephone Encounter (Signed)
The patient was asked how she is doing after starting the myrbetriq samples.   The pt said that she hasn't started the medication yet, that she read the side affects first and that she got to thinking that every time she takes her gout medication that she has to urinate a lot and that she has to have a bowel movement too.  The pt said that the gout med might be the problem but that she will take the myrbetriq if she has to.

## 2020-01-09 ENCOUNTER — Encounter: Payer: Self-pay | Admitting: Internal Medicine

## 2020-01-10 NOTE — Progress Notes (Signed)
Cardiology Office Note:    Date:  01/11/2020   ID:  [place] [employer] American PipelineLaverne Hortence Wood, DOB 01-11-1946, MRN [id number]  PCP:  [name], [name], MD  Cardiologist:  Lesleigh NoeHenry W Davon Abdelaziz III, MD   Referring MD: [name], [name], MD   Chief Complaint  Patient presents with  . Coronary Artery Disease  . Claudication    History of Present Illness:    Dawn Wood is a 74 y.o. female with a hx of CADWithprior LAD DEstent2015Dr. Kahn, hypertension, and hyperlipidemia. Recently cleared for left knee arthroscopy which occurred off antiplatelet therapy without complications.  Chest discomfort has been intermittent since around 2:00.  Very mild.  Not exertion related.  No other symptoms.  She has not tried sublingual nitroglycerin.  Got a cortisone shot earlier this week and is noticed significant elevation in her blood pressure since that time.  She denies shortness of breath and edema.  Past Medical History:  Diagnosis Date  . Coronary artery disease   . Diabetes mellitus without complication (HCC)   . GERD (gastroesophageal reflux disease)   . Hypertension   . MI (myocardial infarction) (HCC)   . Peripheral vascular disease [facility](HCC)     Past Surgical History:  Procedure Laterality Date  . ABDOMINAL HYSTERECTOMY  1985  . APPENDECTOMY    . CATARACT EXTRACTION Left 02/20/2018   Dr. Harlon FlorWhitaker  . CORONARY STENT PLACEMENT  2015  . ectopic pregnancy--unilateral salpingectomy  1980  . KNEE ARTHROSCOPY Left 03/2016  . unilateral oophorectomy      Current Medications: Current Meds  Medication Sig  . amLODipine (NORVASC) 5 MG tablet Take 1 tablet (5 mg total) by mouth daily.  Marland Kitchen. aspirin 81 MG tablet Take 81 mg by mouth daily.  . BD PEN NEEDLE NANO 2ND GEN 32G X 4 MM MISC USE WITH INSULIN PEN TWICE DAILY AS DIRECTED  . Cholecalciferol (VITAMIN D3) 2000 units TABS Take 1 tablet by mouth daily.  . clopidogrel (PLAVIX) 75 MG tablet Take 1 tablet (75 mg total) by mouth daily.  . colchicine 0.6 MG  tablet Take 1 tablet by mouth daily. If you experience a flare, increase to 2 tablets by mouth daily.  Marland Kitchen. dexlansoprazole (DEXILANT) 60 MG capsule Take 1 capsule (60 mg total) by mouth daily.  . fluticasone (FLONASE) 50 MCG/ACT nasal spray Place 1 spray into both nostrils daily as needed for allergies or rhinitis.  . metoprolol succinate (TOPROL-XL) 100 MG 24 hr tablet TAKE 1 TABLET BY MOUTH DAILY IMMEDIATELY FOLLOWING A MEAL  . nitroGLYCERIN (NITROSTAT) 0.4 MG SL tablet Place 1 tablet (0.4 mg total) under the tongue every 5 (five) minutes as needed for chest pain.  Marland Kitchen. NOVOLOG MIX 70/30 FLEXPEN (70-30) 100 UNIT/ML FlexPen INJECT SUBCUTANEOUS 40 UNITS IN THE MORNING AND 45 UNITS AT BEDTIME  . olmesartan (BENICAR) 40 MG tablet TAKE 1 TABLET(40 MG) BY MOUTH DAILY  . ONETOUCH VERIO test strip Use as instructed to check blood sugars 2 times per day dx: e11.65  . OZEMPIC, 0.25 OR 0.5 MG/DOSE, 2 MG/1.5ML SOPN INJECT 0.5MG  INTO SKIN ONCE A WEEK  . rosuvastatin (CRESTOR) 5 MG tablet Take 1 tablet (5 mg total) by mouth daily.  Marland Kitchen. spironolactone-hydrochlorothiazide (ALDACTAZIDE) 25-25 MG tablet Take 1 tablet by mouth daily.  . [DISCONTINUED] clopidogrel (PLAVIX) 75 MG tablet Take 1 tablet (75 mg total) by mouth daily. Please keep upcoming appt in November with Dr. Katrinka BlazingSmith before anymore refills. Thank you  . [DISCONTINUED] nitroGLYCERIN (NITROSTAT) 0.4 MG SL tablet Place 1 tablet (0.4 mg  total) under the tongue every 5 (five) minutes as needed for chest pain.  . [DISCONTINUED] spironolactone-hydrochlorothiazide (ALDACTAZIDE) 25-25 MG tablet TAKE 1 TABLET BY MOUTH DAILY     Allergies:   Dust mite extract, Other, Allopurinol, and Pollen extract   Social History   Socioeconomic History  . Marital status: Single    Spouse name: Not on file  . Number of children: Not on file  . Years of education: Not on file  . Highest education level: Not on file  Occupational History  . Not on file  Tobacco Use  . Smoking  status: Former Smoker    Packs/day: 0.25    Years: 45.00    Pack years: 11.25    Types: Cigarettes    Quit date: 01/10/2013    Years since quitting: 7.0  . Smokeless tobacco: Never Used  Vaping Use  . Vaping Use: Never used  Substance and Sexual Activity  . Alcohol use: Not Currently    Alcohol/week: 0.0 standard drinks  . Drug use: No  . Sexual activity: Not on file  Other Topics Concern  . Not on file  Social History Narrative  . Not on file   Social Determinants of Health   Financial Resource Strain:   . Difficulty of Paying Living Expenses: Not on file  Food Insecurity:   . Worried About Programme researcher, broadcasting/film/video in the Last Year: Not on file  . Ran Out of Food in the Last Year: Not on file  Transportation Needs:   . Lack of Transportation (Medical): Not on file  . Lack of Transportation (Non-Medical): Not on file  Physical Activity:   . Days of Exercise per Week: Not on file  . Minutes of Exercise per Session: Not on file  Stress:   . Feeling of Stress : Not on file  Social Connections:   . Frequency of Communication with Friends and Family: Not on file  . Frequency of Social Gatherings with Friends and Family: Not on file  . Attends Religious Services: Not on file  . Active Member of Clubs or Organizations: Not on file  . Attends Banker Meetings: Not on file  . Marital Status: Not on file     Family History: The patient's family history includes Aortic stenosis in her daughter; Cancer in her father; Heart disease in her daughter; Hypertension in her father and mother; Thyroid disease in her daughter. There is no history of Breast cancer.  ROS:   Please see the history of present illness.    Right rotator cuff injury.  Recurring episodes of gout, urinary frequency, no leg claudication.  All other systems reviewed and are negative.  EKGs/Labs/Other Studies Reviewed:    The following studies were reviewed today:  LE VASC DOPPLER 06/28/2019: Previous  ABI on 06/09/17.    Summary:  Right: Resting right ankle-brachial index is within normal range. No  evidence of significant right lower extremity arterial disease. The right  toe-brachial index is abnormal. RT great toe pressure = 91 mmHg.   Left: Resting left ankle-brachial index indicates moderate left lower  extremity arterial disease. The left toe-brachial index is abnormal. LT  Great toe pressure = 52 mmHg.      *See table(s) above for measurements and observations.     EKG:  EKG not performed  Recent Labs: 10/04/2019: ALT 16; BUN 16; Creatinine, Ser 1.11; Hemoglobin 13.5; Platelets 253; Potassium 4.1; Sodium 140  Recent Lipid Panel    Component Value Date/Time  CHOL 150 10/04/2019 1121   CHOL 167 05/28/2013 0413   TRIG 93 10/04/2019 1121   TRIG 180 05/28/2013 0413   HDL 46 10/04/2019 1121   HDL 37 (L) 05/28/2013 0413   CHOLHDL 3.3 10/04/2019 1121   CHOLHDL 3 12/02/2014 0856   VLDL 22.0 12/02/2014 0856   VLDL 36 05/28/2013 0413   LDLCALC 87 10/04/2019 1121   LDLCALC 94 05/28/2013 0413   LDLDIRECT 83 06/11/2019 1425    Physical Exam:    VS:  BP (!) 158/80   Pulse (!) 55   Ht 5\' 4"  (1.626 m)   Wt 212 lb (96.2 kg)   SpO2 96%   BMI 36.39 kg/m     Wt Readings from Last 3 Encounters:  01/11/20 212 lb (96.2 kg)  01/02/20 210 lb 3.2 oz (95.3 kg)  10/04/19 (!) 207 lb 12.8 oz (94.3 kg)     GEN: Obese. No acute distress HEENT: Normal NECK: No JVD. LYMPHATICS: No lymphadenopathy CARDIAC:  RRR without murmur, gallop, or edema. VASCULAR:  Normal Pulses. No bruits. RESPIRATORY:  Clear to auscultation without rales, wheezing or rhonchi  ABDOMEN: Soft, non-tender, non-distended, No pulsatile mass, MUSCULOSKELETAL: No deformity  SKIN: Warm and dry NEUROLOGIC:  Alert and oriented x 3 PSYCHIATRIC:  Normal affect   ASSESSMENT:    1. CAD in native artery   2. Essential hypertension   3. PVD (peripheral vascular disease) (HCC)   4. Dyslipidemia   5. Educated  about COVID-19 virus infection    PLAN:    In order of problems listed above:  1. Currently having vague discomfort that I do not believe is ischemic.  Could be blood pressure related.  Instructed to take a nitroglycerin when she gets home while lying to see if it helps improve the way she feels. 2. Less than 3000 mg sodium diet.  Continue to monitor pressure at home.  Pressure should start improving as she gets away from the steroid injection in her shoulder. 3. No claudication 4. Having symptoms of musculoskeletal aching in her legs on low-dose rosuvastatin.  Cholesterol clinic/lipid clinic to be determined if there are other management strategies, potentially PCSK9 given her high risk status. 5. She is vaccinated and practicing social distancing.   Medication Adjustments/Labs and Tests Ordered: Current medicines are reviewed at length with the patient today.  Concerns regarding medicines are outlined above.  Orders Placed This Encounter  Procedures  . AMB Referral to [facility] Pharm[facility]   Meds ordered this encounter  Medications  . spironolactone-hydrochlorothiazide (ALDACTAZIDE) 25-25 MG tablet    Sig: Take 1 tablet by mouth daily.    Dispense:  90 tablet    Refill:  3  . clopidogrel (PLAVIX) 75 MG tablet    Sig: Take 1 tablet (75 mg total) by mouth daily.    Dispense:  90 tablet    Refill:  3  . nitroGLYCERIN (NITROSTAT) 0.4 MG SL tablet    Sig: Place 1 tablet (0.4 mg total) under the tongue every 5 (five) minutes as needed for chest pain.    Dispense:  25 tablet    Refill:  3    Patient Instructions  Medication Instructions:  Your physician recommends that you continue on your current medications as directed. Please refer to the Current Medication list given to you today.  *If you need a refill on your cardiac medications before your next appointment, please call your pharmacy*   Lab Work: None If you have labs (blood work) drawn today and your  tests are completely  normal, you will receive your results only by: Marland Kitchen MyChart Message (if you have MyChart) OR . A paper copy in the mail If you have any lab test that is abnormal or we need to change your treatment, we will call you to review the results.   Testing/Procedures: None   Follow-Up: At [facility], you and your health needs are our priority.  As part of our continuing mission to provide you with exceptional heart care, we have created designated Provider Care Teams.  These Care Teams include your primary Cardiologist (physician) and Advanced Practice Providers (APPs -  Physician Assistants and Nurse Practitioners) who all work together to provide you with the care you need, when you need it.  We recommend signing up for the patient portal called "MyChart".  Sign up information is provided on this After Visit Summary.  MyChart is used to connect with patients for Virtual Visits (Telemedicine).  Patients are able to view lab/test results, encounter notes, upcoming appointments, etc.  Non-urgent messages can be sent to your provider as well.   To learn more about what you can do with MyChart, go to [email or web address].    Your next appointment:   12 month(s)  The format for your next appointment:   In Person  Provider:   You may see Lesleigh Noe, MD or one of the following Advanced Practice Providers on your designated Care Team:    Norma Fredrickson, NP  Nada Boozer, NP  Georgie Chard, NP    Other Instructions  Your physician has referred you to our Lipid Clinic.      Signed, Lesleigh Noe, MD  01/11/2020 5:01 PM    [facility]

## 2020-01-11 ENCOUNTER — Ambulatory Visit (INDEPENDENT_AMBULATORY_CARE_PROVIDER_SITE_OTHER): Payer: BC Managed Care – PPO | Admitting: Interventional Cardiology

## 2020-01-11 ENCOUNTER — Other Ambulatory Visit: Payer: Self-pay

## 2020-01-11 ENCOUNTER — Encounter: Payer: Self-pay | Admitting: Interventional Cardiology

## 2020-01-11 VITALS — BP 158/80 | HR 55 | Ht 64.0 in | Wt 212.0 lb

## 2020-01-11 DIAGNOSIS — I10 Essential (primary) hypertension: Secondary | ICD-10-CM | POA: Diagnosis not present

## 2020-01-11 DIAGNOSIS — E785 Hyperlipidemia, unspecified: Secondary | ICD-10-CM | POA: Diagnosis not present

## 2020-01-11 DIAGNOSIS — E1165 Type 2 diabetes mellitus with hyperglycemia: Secondary | ICD-10-CM

## 2020-01-11 DIAGNOSIS — I739 Peripheral vascular disease, unspecified: Secondary | ICD-10-CM

## 2020-01-11 DIAGNOSIS — Z7189 Other specified counseling: Secondary | ICD-10-CM

## 2020-01-11 DIAGNOSIS — I2510 Atherosclerotic heart disease of native coronary artery without angina pectoris: Secondary | ICD-10-CM

## 2020-01-11 MED ORDER — CLOPIDOGREL BISULFATE 75 MG PO TABS
75.0000 mg | ORAL_TABLET | Freq: Every day | ORAL | 3 refills | Status: DC
Start: 2020-01-11 — End: 2021-03-17

## 2020-01-11 MED ORDER — NITROGLYCERIN 0.4 MG SL SUBL: 0.4000 mg | SUBLINGUAL_TABLET | SUBLINGUAL | 3 refills | Status: DC | PRN

## 2020-01-11 MED ORDER — SPIRONOLACTONE-HCTZ 25-25 MG PO TABS: 1.0000 | ORAL_TABLET | Freq: Every day | ORAL | 3 refills | Status: DC

## 2020-01-11 NOTE — Patient Instructions (Signed)
Medication Instructions:  Your physician recommends that you continue on your current medications as directed. Please refer to the Current Medication list given to you today.  *If you need a refill on your cardiac medications before your next appointment, please call your pharmacy*   Lab Work: None If you have labs (blood work) drawn today and your tests are completely normal, you will receive your results only by: Marland Kitchen MyChart Message (if you have MyChart) OR . A paper copy in the mail If you have any lab test that is abnormal or we need to change your treatment, we will call you to review the results.   Testing/Procedures: None   Follow-Up: At [facility], you and your health needs are our priority.  As part of our continuing mission to provide you with exceptional heart care, we have created designated Provider Care Teams.  These Care Teams include your primary Cardiologist (physician) and Advanced Practice Providers (APPs -  Physician Assistants and Nurse Practitioners) who all work together to provide you with the care you need, when you need it.  We recommend signing up for the patient portal called "MyChart".  Sign up information is provided on this After Visit Summary.  MyChart is used to connect with patients for Virtual Visits (Telemedicine).  Patients are able to view lab/test results, encounter notes, upcoming appointments, etc.  Non-urgent messages can be sent to your provider as well.   To learn more about what you can do with MyChart, go to [email or web address].    Your next appointment:   12 month(s)  The format for your next appointment:   In Person  Provider:   You may see Lesleigh Noe, MD or one of the following Advanced Practice Providers on your designated Care Team:    Norma Fredrickson, NP  Nada Boozer, NP  Georgie Chard, NP    Other Instructions  Your physician has referred you to our Lipid Clinic.

## 2020-01-13 ENCOUNTER — Other Ambulatory Visit: Payer: Self-pay | Admitting: Interventional Cardiology

## 2020-01-14 ENCOUNTER — Other Ambulatory Visit: Payer: Self-pay

## 2020-01-14 MED ORDER — AMLODIPINE BESYLATE 5 MG PO TABS
5.0000 mg | ORAL_TABLET | Freq: Every day | ORAL | 3 refills | Status: DC
Start: 2020-01-14 — End: 2021-01-02

## 2020-01-14 NOTE — Telephone Encounter (Signed)
Pt's medication was sent to pt's pharmacy as requested. Confirmation received.  °

## 2020-01-21 NOTE — Progress Notes (Incomplete)
Patient ID: Dawn Wood                 DOB: 1945/09/24                    MRN: [id number]     HPI: Dawn Wood is a 74 y.o. female patient referred to lipid clinic by Dr. Katrinka Blazing. PMH is significant for CAD with prior LAD DEstent(2015), MI, HTN, HLD, DM, GERD, PVD. Last seen by Dr. Katrinka Blazing on 01/11/20 and reported symptoms of musculoskeletal aching in her legs on low-dose rosuvastatin 5 mg daily.  Patient presents today in good spirits. Reports ***  Compliance? Muscle pain? Meds tried in the past and any side effects? Go over labs and goals Lipid therapy options - BCBS PPO state and Medicare Part A -Repatha - state health plan - $5 copay -Zetia Diet?? Exercise??   Order Lab F/u appt in 3 months (lipid panel and LFTs)  Current Medications: rosuvastatin 5 mg daily Intolerances: atorvastatin 20 mg daily and 3x/week (muscle aches), simvastatin 40 mg daily Risk Factors: CAD, MI, HTN, HLD, DM, PVD, tobacco abuse LDL goal: <55 mg/dL  Diet:   Exercise:   Family History: Aortic stenosis in her daughter; Cancer in her father; Heart disease in her daughter; Hypertension in her father and mother; Thyroid disease in her daughter.  Social History: former smoker (quit 2014; 0.25 ppd, 45 years)  Labs: 10/04/19: LDL 87, TC 150, TG 93, HDL 46 (rosuvastatin 5mg )  Past Medical History:  Diagnosis Date  . Coronary artery disease   . Diabetes mellitus without complication (HCC)   . GERD (gastroesophageal reflux disease)   . Hypertension   . MI (myocardial infarction) (HCC)   . Peripheral vascular disease (HCC)     Current Outpatient Medications on File Prior to Visit  Medication Sig Dispense Refill  . amLODipine (NORVASC) 5 MG tablet Take 1 tablet (5 mg total) by mouth daily. 90 tablet 3  . aspirin 81 MG tablet Take 81 mg by mouth daily.    . BD PEN NEEDLE NANO 2ND GEN 32G X 4 MM MISC USE WITH INSULIN PEN TWICE DAILY AS DIRECTED 100 each 2  . Cholecalciferol (VITAMIN  D3) 2000 units TABS Take 1 tablet by mouth daily.    . clopidogrel (PLAVIX) 75 MG tablet Take 1 tablet (75 mg total) by mouth daily. 90 tablet 3  . colchicine 0.6 MG tablet Take 1 tablet by mouth daily. If you experience a flare, increase to 2 tablets by mouth daily. 60 tablet 2  . dexlansoprazole (DEXILANT) 60 MG capsule Take 1 capsule (60 mg total) by mouth daily. 30 capsule 1  . fluticasone (FLONASE) 50 MCG/ACT nasal spray Place 1 spray into both nostrils daily as needed for allergies or rhinitis. 16 g 2  . metoprolol succinate (TOPROL-XL) 100 MG 24 hr tablet TAKE 1 TABLET BY MOUTH DAILY IMMEDIATELY FOLLOWING A MEAL 90 tablet 1  . nitroGLYCERIN (NITROSTAT) 0.4 MG SL tablet Place 1 tablet (0.4 mg total) under the tongue every 5 (five) minutes as needed for chest pain. 25 tablet 3  . NOVOLOG MIX 70/30 FLEXPEN (70-30) 100 UNIT/ML FlexPen INJECT SUBCUTANEOUS 40 UNITS IN THE MORNING AND 45 UNITS AT BEDTIME 75 mL 2  . olmesartan (BENICAR) 40 MG tablet TAKE 1 TABLET(40 MG) BY MOUTH DAILY 90 tablet 2  . ONETOUCH VERIO test strip Use as instructed to check blood sugars 2 times per day dx: e11.65 100 strip 3  . OZEMPIC,  0.25 OR 0.5 MG/DOSE, 2 MG/1.5ML SOPN INJECT 0.5MG  INTO SKIN ONCE A WEEK 1.5 mL 1  . rosuvastatin (CRESTOR) 5 MG tablet Take 1 tablet (5 mg total) by mouth daily. 30 tablet 11  . spironolactone-hydrochlorothiazide (ALDACTAZIDE) 25-25 MG tablet Take 1 tablet by mouth daily. 90 tablet 3   No current facility-administered medications on file prior to visit.    Allergies  Allergen Reactions  . Dust Mite Extract Other (See Comments)    REACTION: SNEEZING  . Other Other (See Comments)    ALLERGEN: DEODORIZERS SPRAY AND PERFUMES REACTION: SNEEZING  . Allopurinol     Diarrhea and the pt said it made her sugars low  . Pollen Extract Other (See Comments)    Reaction unknown    Assessment/Plan:  1. Hyperlipidemia -   Fabio Neighbors, PharmD PGY2 Ambulatory Care Pharmacy Resident [facility] [address], [place], [place] [address] Phone: [phone]; Fax: [phone]

## 2020-01-22 ENCOUNTER — Ambulatory Visit: Payer: BC Managed Care – PPO

## 2020-02-04 NOTE — Progress Notes (Signed)
Patient ID: Dawn Wood                 DOB: 05-15-1945                    MRN: [id number]     HPI: Dawn Wood is a 74 y.o. female patient referred to lipid clinic by Dr. Katrinka Blazing. PMH is significant for CAD with prior LAD drug elutingstent(2015), MI, HTN, HLD, DM, GERD, PVD. Last seen by Dr. Katrinka Blazing on 01/11/20 and reported symptoms of leg musculoskeletal aching  on low-dose rosuvastatin 5 mg daily.  Patient presents today in good spirits. Reports stopping rosuvastatin ~1 month ago due to body aches. Reports trying rosuvastatin 3x/weekly with no resolution of symptoms.   Current Medications: rosuvastatin 5 mg daily (not taking) Intolerances: atorvastatin 20 mg daily and 3x/week (muscle aches), simvastatin 40 mg daily, rosuvastatin 5mg  daily and 3x/week - myalgias Risk Factors: CAD, MI, HTN, HLD, DM, PVD, tobacco abuse LDL goal: <55 mg/dL  Diet:  -Breakfast: fish sandwich, whole wheat bread, cup of peaches, 1 cup of coffee -Lunch/dinner: fish and baked potato -Snacks: nuts -Drinks: 1 cup of coffee, lemonade  Exercise: none  Family History: Aortic stenosis in her daughter; Cancer in her father; Heart disease in her daughter; Hypertension in her father and mother; Thyroid disease in her daughter.  Social History: former smoker (quit 2014; 0.25 ppd, 45 years)  Labs: 10/04/19: LDL 87, TC 150, TG 93, HDL 46 (rosuvastatin 5mg  daily)  Past Medical History:  Diagnosis Date  . Coronary artery disease   . Diabetes mellitus without complication (HCC)   . GERD (gastroesophageal reflux disease)   . Hypertension   . MI (myocardial infarction) (HCC)   . Peripheral vascular disease (HCC)     Current Outpatient Medications on File Prior to Visit  Medication Sig Dispense Refill  . amLODipine (NORVASC) 5 MG tablet Take 1 tablet (5 mg total) by mouth daily. 90 tablet 3  . aspirin 81 MG tablet Take 81 mg by mouth daily.    . BD PEN NEEDLE NANO 2ND GEN 32G X 4 MM MISC USE WITH  INSULIN PEN TWICE DAILY AS DIRECTED 100 each 2  . Cholecalciferol (VITAMIN D3) 2000 units TABS Take 1 tablet by mouth daily.    . clopidogrel (PLAVIX) 75 MG tablet Take 1 tablet (75 mg total) by mouth daily. 90 tablet 3  . colchicine 0.6 MG tablet Take 1 tablet by mouth daily. If you experience a flare, increase to 2 tablets by mouth daily. 60 tablet 2  . dexlansoprazole (DEXILANT) 60 MG capsule Take 1 capsule (60 mg total) by mouth daily. 30 capsule 1  . fluticasone (FLONASE) 50 MCG/ACT nasal spray Place 1 spray into both nostrils daily as needed for allergies or rhinitis. 16 g 2  . metoprolol succinate (TOPROL-XL) 100 MG 24 hr tablet TAKE 1 TABLET BY MOUTH DAILY IMMEDIATELY FOLLOWING A MEAL 90 tablet 1  . nitroGLYCERIN (NITROSTAT) 0.4 MG SL tablet Place 1 tablet (0.4 mg total) under the tongue every 5 (five) minutes as needed for chest pain. 25 tablet 3  . NOVOLOG MIX 70/30 FLEXPEN (70-30) 100 UNIT/ML FlexPen INJECT SUBCUTANEOUS 40 UNITS IN THE MORNING AND 45 UNITS AT BEDTIME 75 mL 2  . olmesartan (BENICAR) 40 MG tablet TAKE 1 TABLET(40 MG) BY MOUTH DAILY 90 tablet 2  . ONETOUCH VERIO test strip Use as instructed to check blood sugars 2 times per day dx: e11.65 100 strip 3  .  OZEMPIC, 0.25 OR 0.5 MG/DOSE, 2 MG/1.5ML SOPN INJECT 0.5MG  INTO SKIN ONCE A WEEK 1.5 mL 1  . rosuvastatin (CRESTOR) 5 MG tablet Take 1 tablet (5 mg total) by mouth daily. 30 tablet 11  . spironolactone-hydrochlorothiazide (ALDACTAZIDE) 25-25 MG tablet Take 1 tablet by mouth daily. 90 tablet 3   No current facility-administered medications on file prior to visit.    Allergies  Allergen Reactions  . Dust Mite Extract Other (See Comments)    REACTION: SNEEZING  . Other Other (See Comments)    ALLERGEN: DEODORIZERS SPRAY AND PERFUMES REACTION: SNEEZING  . Allopurinol     Diarrhea and the pt said it made her sugars low  . Pollen Extract Other (See Comments)    Reaction unknown    Assessment/Plan:  1. Hyperlipidemia  -  LDL above goal <55 mg/dL due to progressive heart disease. Patient is intolerant to atorvastatin and simvastatin, and now experiencing body aches with rosuvastatin 5 mg daily and 3x/weekly. Discussed initiating a PCSK9-inhibitor (Repatha) and its clinical benefits, potential side-effects, and proper injection technique with teach-back method using demo pen. Patient verbalized understanding. Repatha prior authorization pending approval. Once approved, will contact patient and start Repatha 140 mg subcutaneously every 2 weeks. Encouraged patient to aim for a diet full of vegetables, fruit and lean meats (chicken, [place], fish) and to limit carbs (bread, pasta, sugar, rice) and red meat consumption. Encouraged patient to exercise/walk for 20-30 minutes daily with the goal of 150 minutes per week. Patient verbalized understanding. Scheduled follow-up fasting lipid panel and LFTs in 3 months.   Fabio Neighbors, PharmD, BCPS PGY2 Ambulatory Care Pharmacy Resident [facility] [address] [place], [place] [address] Phone: [phone]; Fax: [phone]

## 2020-02-05 ENCOUNTER — Other Ambulatory Visit: Payer: Self-pay | Admitting: Internal Medicine

## 2020-02-05 ENCOUNTER — Other Ambulatory Visit: Payer: Self-pay

## 2020-02-05 ENCOUNTER — Ambulatory Visit (INDEPENDENT_AMBULATORY_CARE_PROVIDER_SITE_OTHER): Payer: BC Managed Care – PPO | Admitting: Pharmacist

## 2020-02-05 DIAGNOSIS — T466X5A Adverse effect of antihyperlipidemic and antiarteriosclerotic drugs, initial encounter: Secondary | ICD-10-CM

## 2020-02-05 DIAGNOSIS — G720 Drug-induced myopathy: Secondary | ICD-10-CM

## 2020-02-05 DIAGNOSIS — E785 Hyperlipidemia, unspecified: Secondary | ICD-10-CM | POA: Diagnosis not present

## 2020-02-05 MED ORDER — REPATHA SURECLICK 140 MG/ML ~~LOC~~ SOAJ: 1.0000 "pen " | SUBCUTANEOUS | 11 refills | Status: DC

## 2020-02-05 NOTE — Patient Instructions (Addendum)
Nice to see you today!  Keep up the good work with diet and exercise. Aim for a diet full of vegetables, fruit and lean meats (chicken, [place], fish). Try to limit carbs (bread, pasta, sugar, rice) and red meat consumption.  Your goal LDL is <55 mg/dL, you're currently at 87 mg/dL  Medication Changes: Will send Repatha to your insurance for approval, once approved will give you a call when medication is ready for pick up at your pharmacy. -You will inject Repatha 140 mg every 2 weeks  Discontinue rosuvastatin  Please give [place] a call at [phone] with any questions or concerns.  For PCSK9i, inject once every other week (any day of the week that works for you) into the fatty skin of stomach, upper outer thigh or back of the arm. Clean the site with soap and warm water or an alcohol pad. Keep the medication in the fridge until you are ready to give your dose, then take it out and let warm up to room temperature for 30-60 mins

## 2020-02-06 ENCOUNTER — Telehealth: Payer: Self-pay | Admitting: Pharmacist

## 2020-02-06 ENCOUNTER — Telehealth: Payer: Self-pay

## 2020-02-06 DIAGNOSIS — E785 Hyperlipidemia, unspecified: Secondary | ICD-10-CM

## 2020-02-06 MED ORDER — PRALUENT 75 MG/ML ~~LOC~~ SOAJ: 75.0000 mg | SUBCUTANEOUS | 11 refills | Status: DC

## 2020-02-06 NOTE — Telephone Encounter (Signed)
Contacted patient regarding hyperlipidemia management. Informed patient Praluent has been sent to [facility] pharmacy. Patient verbalized understanding. Scheduled fasting lipid panel and LFTs in 3 months. All questions were answered.   PA approved 02/06/2020 - 02/05/2021

## 2020-02-06 NOTE — Telephone Encounter (Signed)
The pt wanted to know if it's ok for her to take a Pfizer covid shot as her booster shot, the pt said she had the J&J covid vaccination in March and was told by Dr. Allyne Gee to get a Moderna vaccination shot for her booster.  The pt said that they are offering the Pfizer at [employer] where she works at.

## 2020-02-06 NOTE — Telephone Encounter (Signed)
Noted, agree with plan as documented.

## 2020-02-06 NOTE — Telephone Encounter (Signed)
I did not tell her she had to get Moderna. I advised her that is what we offer here. Yes, she can get Pfizer vaccine as her booster.

## 2020-02-07 ENCOUNTER — Telehealth: Payer: Self-pay

## 2020-02-07 NOTE — Telephone Encounter (Signed)
The pt was notified that Dr Allyne Gee said the office has the [facility] vaccination at the office, not that the pt has to take the [facility] as her booster because the pt has J&J vaccination. The pt was told that Dr Allyne Gee said the pt can get the Pfizer vaccination as her booster.

## 2020-02-11 ENCOUNTER — Other Ambulatory Visit: Payer: Self-pay

## 2020-02-11 ENCOUNTER — Ambulatory Visit (INDEPENDENT_AMBULATORY_CARE_PROVIDER_SITE_OTHER): Payer: BC Managed Care – PPO | Admitting: Internal Medicine

## 2020-02-11 ENCOUNTER — Encounter: Payer: Self-pay | Admitting: Internal Medicine

## 2020-02-11 VITALS — BP 140/70 | HR 96 | Temp 98.3°F | Ht 63.2 in | Wt 206.8 lb

## 2020-02-11 DIAGNOSIS — I2510 Atherosclerotic heart disease of native coronary artery without angina pectoris: Secondary | ICD-10-CM | POA: Diagnosis not present

## 2020-02-11 DIAGNOSIS — E1122 Type 2 diabetes mellitus with diabetic chronic kidney disease: Secondary | ICD-10-CM

## 2020-02-11 DIAGNOSIS — I1310 Hypertensive heart and chronic kidney disease without heart failure, with stage 1 through stage 4 chronic kidney disease, or unspecified chronic kidney disease: Secondary | ICD-10-CM | POA: Diagnosis not present

## 2020-02-11 DIAGNOSIS — N182 Chronic kidney disease, stage 2 (mild): Secondary | ICD-10-CM

## 2020-02-11 DIAGNOSIS — G720 Drug-induced myopathy: Secondary | ICD-10-CM

## 2020-02-11 DIAGNOSIS — Z6836 Body mass index (BMI) 36.0-36.9, adult: Secondary | ICD-10-CM

## 2020-02-11 DIAGNOSIS — Z794 Long term (current) use of insulin: Secondary | ICD-10-CM

## 2020-02-11 DIAGNOSIS — T466X5A Adverse effect of antihyperlipidemic and antiarteriosclerotic drugs, initial encounter: Secondary | ICD-10-CM

## 2020-02-11 DIAGNOSIS — Z2821 Immunization not carried out because of patient refusal: Secondary | ICD-10-CM

## 2020-02-11 MED ORDER — OZEMPIC (0.25 OR 0.5 MG/DOSE) 2 MG/1.5ML ~~LOC~~ SOPN: PEN_INJECTOR | SUBCUTANEOUS | 1 refills | Status: DC

## 2020-02-11 NOTE — Progress Notes (Signed)
I,Dawn Wood,acting as a Education administrator for Dawn Greenland, MD.,have documented all relevant documentation on the behalf of Dawn Greenland, MD,as directed by  Dawn Greenland, MD while in the presence of Dawn Greenland, MD.  This visit occurred during the SARS-CoV-2 public health emergency.  Safety protocols were in place, including screening questions prior to the visit, additional usage of staff PPE, and extensive cleaning of exam room while observing appropriate contact time as indicated for disinfecting solutions.  Subjective:     Patient ID: Dawn Wood , female    DOB: 1945-03-20 , 74 y.o.   MRN: [id number]   Chief Complaint  Patient presents with  . Diabetes  . Hypertension    HPI  Patient is here today for diabetes check. She states that she is compliant with medication. She does have a concerns with cramps all over her body. She admits that she does not drink adequate amounts of water.   Diabetes She presents for her follow-up diabetic visit. She has type 2 diabetes mellitus. Her disease course has been stable. There are no hypoglycemic associated symptoms. Pertinent negatives for diabetes include no blurred vision. There are no hypoglycemic complications. Diabetic complications include nephropathy. Risk factors for coronary artery disease include diabetes mellitus, dyslipidemia, hypertension, obesity, sedentary lifestyle and post-menopausal. Current diabetic treatment includes insulin injections. She is following a diabetic diet. She participates in exercise intermittently. Her home blood glucose trend is fluctuating minimally. Her breakfast blood glucose is taken between 8-9 am. Her breakfast blood glucose range is generally 110-130 mg/dl. Eye exam is current.  Hypertension This is a chronic problem. The current episode started more than 1 year ago. The problem has been gradually improving since onset. Pertinent negatives include no blurred vision. Risk factors for coronary  artery disease include diabetes mellitus, dyslipidemia, sedentary lifestyle and post-menopausal state. Hypertensive end-organ damage includes kidney disease.     Past Medical History:  Diagnosis Date  . Coronary artery disease   . Diabetes mellitus without complication ([place])   . GERD (gastroesophageal reflux disease)   . Hypertension   . MI (myocardial infarction) ([place])   . Peripheral vascular disease ([place])      Family History  Problem Relation Age of Onset  . Aortic stenosis Daughter   . Heart disease Daughter        before age 83  . Hypertension Mother   . Cancer Father   . Hypertension Father   . Thyroid disease Daughter   . Breast cancer Neg Hx      Current Outpatient Medications:  .  Alirocumab (PRALUENT) 75 MG/ML SOAJ, Inject 75 mg into the skin every 14 (fourteen) days., Disp: 2 mL, Rfl: 11 .  amLODipine (NORVASC) 5 MG tablet, Take 1 tablet (5 mg total) by mouth daily., Disp: 90 tablet, Rfl: 3 .  aspirin 81 MG tablet, Take 81 mg by mouth daily., Disp: , Rfl:  .  BD PEN NEEDLE NANO 2ND GEN 32G X 4 MM MISC, USE WITH INSULIN PEN TWICE DAILY AS DIRECTED, Disp: 100 each, Rfl: 2 .  Cholecalciferol (VITAMIN D3) 2000 units TABS, Take 1 tablet by mouth daily., Disp: , Rfl:  .  clopidogrel (PLAVIX) 75 MG tablet, Take 1 tablet (75 mg total) by mouth daily., Disp: 90 tablet, Rfl: 3 .  colchicine 0.6 MG tablet, Take 1 tablet by mouth daily. If you experience a flare, increase to 2 tablets by mouth daily., Disp: 60 tablet, Rfl: 2 .  dexlansoprazole (DEXILANT) 60 MG  capsule, Take 1 capsule (60 mg total) by mouth daily., Disp: 30 capsule, Rfl: 1 .  fluticasone (FLONASE) 50 MCG/ACT nasal spray, Place 1 spray into both nostrils daily as needed for allergies or rhinitis., Disp: 16 g, Rfl: 2 .  metoprolol succinate (TOPROL-XL) 100 MG 24 hr tablet, TAKE 1 TABLET BY MOUTH DAILY IMMEDIATELY FOLLOWING A MEAL, Disp: 90 tablet, Rfl: 1 .  nitroGLYCERIN (NITROSTAT) 0.4 MG SL tablet, Place 1 tablet  (0.4 mg total) under the tongue every 5 (five) minutes as needed for chest pain., Disp: 25 tablet, Rfl: 3 .  NOVOLOG MIX 70/30 FLEXPEN (70-30) 100 UNIT/ML FlexPen, INJECT SUBCUTANEOUS 40 UNITS IN THE MORNING AND 45 UNITS AT BEDTIME, Disp: 75 mL, Rfl: 2 .  olmesartan (BENICAR) 40 MG tablet, TAKE 1 TABLET(40 MG) BY MOUTH DAILY, Disp: 90 tablet, Rfl: 2 .  ONETOUCH VERIO test strip, Use as instructed to check blood sugars 2 times per day dx: e11.65, Disp: 100 strip, Rfl: 3 .  Semaglutide,0.25 or 0.5MG/DOS, (OZEMPIC, 0.25 OR 0.5 MG/DOSE,) 2 MG/1.5ML SOPN, INJECT 0.5MG INTO SKIN ONCE A WEEK, Disp: 4.5 mL, Rfl: 1 .  spironolactone-hydrochlorothiazide (ALDACTAZIDE) 25-25 MG tablet, Take 1 tablet by mouth daily., Disp: 90 tablet, Rfl: 3   Allergies  Allergen Reactions  . Dust Mite Extract Other (See Comments)    REACTION: SNEEZING  . Other Other (See Comments)    ALLERGEN: DEODORIZERS SPRAY AND PERFUMES REACTION: SNEEZING  . Statins Other (See Comments)    Muscle aches with rosuvastatin 5 mg daily and 3x/weekly, atorvastatin 20 mg daily and 3x/week, simvastatin 40 mg daily   . Allopurinol     Diarrhea and the pt said it made her sugars low  . Pollen Extract Other (See Comments)    Reaction unknown     Review of Systems  Constitutional: Negative.   Eyes: Negative for blurred vision.  Respiratory: Negative.   Cardiovascular: Negative.   Gastrointestinal: Negative.   Neurological: Negative.      Today's Vitals   02/11/20 1000  BP: 140/70  Pulse: 96  Temp: 98.3 F (36.8 C)  TempSrc: Oral  Weight: 206 lb 12.8 oz (93.8 kg)  Height: 5' 3.2" (1.605 m)   Body mass index is 36.4 kg/m.   Objective:  Physical Exam Vitals and nursing note reviewed.  Constitutional:      Appearance: Normal appearance. She is obese.  HENT:     Head: Normocephalic and atraumatic.  Cardiovascular:     Rate and Rhythm: Normal rate and regular rhythm.     Heart sounds: Normal heart sounds.  Pulmonary:      Effort: Pulmonary effort is normal.     Breath sounds: Normal breath sounds.  Skin:    General: Skin is warm.  Neurological:     General: No focal deficit present.     Mental Status: She is alert.  Psychiatric:        Mood and Affect: Mood normal.        Behavior: Behavior normal.         Assessment And Plan:     1. Type 2 diabetes mellitus with stage 2 chronic kidney disease, with long-term current use of insulin (HCC) Comments: Chronic, I will check labs as listed below. Encouraged to follow dietary guidelines.  - Semaglutide,0.25 or 0.5MG/DOS, (OZEMPIC, 0.25 OR 0.5 MG/DOSE,) 2 MG/1.5ML SOPN; INJECT 0.5MG INTO SKIN ONCE A WEEK  Dispense: 4.5 mL; Refill: 1 - BMP8+EGFR - Hemoglobin A1c  2. Hypertensive heart and renal disease  with renal failure, stage 1 through stage 4 or unspecified chronic kidney disease, without heart failure Comments: Fair control, she will continue with current meds. Encouraged to avoid cooking with salt and to avoid fried foods.   3. Statin myopathy Comments: Chronic, she has failed several statins. She has now been prescribed Praluent in lipid clinic. Pt encouraged to take meds as prescribed.She is encouraged to stay well hydrated.  - Magnesium  4. 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 She is encouraged to incorporate more exercise into her daily routine.   5. Influenza vaccination declined Comments: She agrees to consider pneumonia vaccine in the near future.      Patient was given opportunity to ask questions. Patient verbalized understanding of the plan and was able to repeat key elements of the plan. All questions were answered to their satisfaction.  Dawn Greenland, MD   I, Dawn Greenland, MD, have reviewed all documentation for this visit. The documentation on 02/11/20 for the exam, diagnosis, procedures, and orders are all accurate and complete.  THE PATIENT IS  ENCOURAGED TO PRACTICE SOCIAL DISTANCING DUE TO THE COVID-19 PANDEMIC.

## 2020-02-11 NOTE — Patient Instructions (Addendum)
Vitamin D - take one 2000 unit capsule M-F, take 2 capsules on Saturday/Sunday  Take Omega-XL on Wednesday and Saturday ONLY   Diabetes Mellitus and Exercise Exercising regularly is important for your overall health, especially when you have diabetes (diabetes mellitus). Exercising is not only about losing weight. It has many other health benefits, such as increasing muscle strength and bone density and reducing body fat and stress. This leads to improved fitness, flexibility, and endurance, all of which result in better overall health. Exercise has additional benefits for people with diabetes, including:  Reducing appetite.  Helping to lower and control blood glucose.  Lowering blood pressure.  Helping to control amounts of fatty substances (lipids) in the blood, such as cholesterol and triglycerides.  Helping the body to respond better to insulin (improving insulin sensitivity).  Reducing how much insulin the body needs.  Decreasing the risk for heart disease by: ? Lowering cholesterol and triglyceride levels. ? Increasing the levels of good cholesterol. ? Lowering blood glucose levels. What is my activity plan? Your health care provider or certified diabetes educator can help you make a plan for the type and frequency of exercise (activity plan) that works for you. Make sure that you:  Do at least 150 minutes of moderate-intensity or vigorous-intensity exercise each week. This could be brisk walking, biking, or water aerobics. ? Do stretching and strength exercises, such as yoga or weightlifting, at least 2 times a week. ? Spread out your activity over at least 3 days of the week.  Get some form of physical activity every day. ? Do not go more than 2 days in a row without some kind of physical activity. ? Avoid being inactive for more than 30 minutes at a time. Take frequent breaks to walk or stretch.  Choose a type of exercise or activity that you enjoy, and set realistic  goals.  Start slowly, and gradually increase the intensity of your exercise over time. What do I need to know about managing my diabetes?   Check your blood glucose before and after exercising. ? If your blood glucose is 240 mg/dL (23.5 mmol/L) or higher before you exercise, check your urine for ketones. If you have ketones in your urine, do not exercise until your blood glucose returns to normal. ? If your blood glucose is 100 mg/dL (5.6 mmol/L) or lower, eat a snack containing 15-20 grams of carbohydrate. Check your blood glucose 15 minutes after the snack to make sure that your level is above 100 mg/dL (5.6 mmol/L) before you start your exercise.  Know the symptoms of low blood glucose (hypoglycemia) and how to treat it. Your risk for hypoglycemia increases during and after exercise. Common symptoms of hypoglycemia can include: ? Hunger. ? Anxiety. ? Sweating and feeling clammy. ? Confusion. ? Dizziness or feeling light-headed. ? Increased heart rate or palpitations. ? Blurry vision. ? Tingling or numbness around the mouth, lips, or tongue. ? Tremors or shakes. ? Irritability.  Keep a rapid-acting carbohydrate snack available before, during, and after exercise to help prevent or treat hypoglycemia.  Avoid injecting insulin into areas of the body that are going to be exercised. For example, avoid injecting insulin into: ? The arms, when playing tennis. ? The legs, when jogging.  Keep records of your exercise habits. Doing this can help you and your health care provider adjust your diabetes management plan as needed. Write down: ? Food that you eat before and after you exercise. ? Blood glucose levels before and  after you exercise. ? The type and amount of exercise you have done. ? When your insulin is expected to peak, if you use insulin. Avoid exercising at times when your insulin is peaking.  When you start a new exercise or activity, work with your health care provider to make  sure the activity is safe for you, and to adjust your insulin, medicines, or food intake as needed.  Drink plenty of water while you exercise to prevent dehydration or heat stroke. Drink enough fluid to keep your urine clear or pale yellow. Summary  Exercising regularly is important for your overall health, especially when you have diabetes (diabetes mellitus).  Exercising has many health benefits, such as increasing muscle strength and bone density and reducing body fat and stress.  Your health care provider or certified diabetes educator can help you make a plan for the type and frequency of exercise (activity plan) that works for you.  When you start a new exercise or activity, work with your health care provider to make sure the activity is safe for you, and to adjust your insulin, medicines, or food intake as needed. This information is not intended to replace advice given to you by your health care provider. Make sure you discuss any questions you have with your health care provider. Document Revised: 09/16/2016 Document Reviewed: 08/04/2015 Elsevier Patient Education  2020 [employer].

## 2020-02-12 LAB — BMP8+EGFR
BUN/Creatinine Ratio: 17 (ref 12–28)
BUN: 20 mg/dL (ref 8–27)
CO2: 24 mmol/L (ref 20–29)
Calcium: 9.5 mg/dL (ref 8.7–10.3)
Chloride: 100 mmol/L (ref 96–106)
Creatinine, Ser: 1.17 mg/dL — ABNORMAL HIGH (ref 0.57–1.00)
GFR calc Af Amer: 53 mL/min/{1.73_m2} — ABNORMAL LOW (ref >59)
GFR calc non Af Amer: 46 mL/min/{1.73_m2} — ABNORMAL LOW (ref >59)
Glucose: 113 mg/dL — ABNORMAL HIGH (ref 65–99)
Potassium: 4.4 mmol/L (ref 3.5–5.2)
Sodium: 140 mmol/L (ref 134–144)

## 2020-02-12 LAB — HEMOGLOBIN A1C
Est. average glucose Bld gHb Est-mCnc: 169 mg/dL
Hgb A1c MFr Bld: 7.5 % — ABNORMAL HIGH (ref 4.8–5.6)

## 2020-02-12 LAB — MAGNESIUM: Magnesium: 1.8 mg/dL (ref 1.6–2.3)

## 2020-02-21 ENCOUNTER — Ambulatory Visit: Payer: BC Managed Care – PPO

## 2020-02-21 ENCOUNTER — Ambulatory Visit: Payer: BC Managed Care – PPO | Attending: Internal Medicine

## 2020-02-21 NOTE — Progress Notes (Signed)
   Covid-19 Vaccination Clinic  Name:  Dawn Wood    MRN: [id number] DOB: 1945/07/24  02/21/2020  Dawn Wood was observed post Covid-19 immunization for 15 minutes without incident. She was provided with Vaccine Information Sheet and instruction to access the V-Safe system.   Dawn Wood was instructed to call 911 with any severe reactions post vaccine: Marland Kitchen Difficulty breathing  . Swelling of face and throat  . A fast heartbeat  . A bad rash all over body  . Dizziness and weakness

## 2020-04-29 ENCOUNTER — Other Ambulatory Visit: Payer: Self-pay | Admitting: Internal Medicine

## 2020-04-29 DIAGNOSIS — Z0000 Encounter for general adult medical examination without abnormal findings: Secondary | ICD-10-CM

## 2020-05-05 ENCOUNTER — Other Ambulatory Visit: Payer: BC Managed Care – PPO

## 2020-05-12 ENCOUNTER — Other Ambulatory Visit: Payer: Self-pay

## 2020-05-12 MED ORDER — FLUTICASONE PROPIONATE 50 MCG/ACT NA SUSP: 1.0000 | Freq: Every day | NASAL | 2 refills | Status: DC | PRN

## 2020-05-19 ENCOUNTER — Other Ambulatory Visit: Payer: Self-pay

## 2020-05-19 ENCOUNTER — Other Ambulatory Visit: Payer: Self-pay | Admitting: Internal Medicine

## 2020-05-19 MED ORDER — COLCHICINE 0.6 MG PO TABS
ORAL_TABLET | ORAL | 2 refills | Status: DC
Start: 2020-05-19 — End: 2021-09-22

## 2020-05-26 ENCOUNTER — Other Ambulatory Visit: Payer: BC Managed Care – PPO | Admitting: *Deleted

## 2020-05-26 ENCOUNTER — Other Ambulatory Visit: Payer: Self-pay

## 2020-05-26 DIAGNOSIS — E785 Hyperlipidemia, unspecified: Secondary | ICD-10-CM

## 2020-05-26 LAB — LIPID PANEL
Chol/HDL Ratio: 4.3 ratio (ref 0.0–4.4)
Cholesterol, Total: 183 mg/dL (ref 100–199)
HDL: 43 mg/dL (ref >39)
LDL Chol Calc (NIH): 117 mg/dL — ABNORMAL HIGH (ref 0–99)
Triglycerides: 131 mg/dL (ref 0–149)
VLDL Cholesterol Cal: 23 mg/dL (ref 5–40)

## 2020-05-26 LAB — HEPATIC FUNCTION PANEL
ALT: 13 IU/L (ref 0–32)
AST: 12 IU/L (ref 0–40)
Albumin: 4.1 g/dL (ref 3.7–4.7)
Alkaline Phosphatase: 74 IU/L (ref 44–121)
Bilirubin Total: 0.8 mg/dL (ref 0.0–1.2)
Bilirubin, Direct: 0.17 mg/dL (ref 0.00–0.40)
Total Protein: 7.2 g/dL (ref 6.0–8.5)

## 2020-05-30 ENCOUNTER — Telehealth: Payer: Self-pay | Admitting: Pharmacist

## 2020-05-30 DIAGNOSIS — E785 Hyperlipidemia, unspecified: Secondary | ICD-10-CM

## 2020-05-30 NOTE — Telephone Encounter (Addendum)
Pt was seen in lipid clinic 02/05/20 and was agreeable to start Praluent injections at that time. However, LDL was elevated at 117 on lipid panel checked 3/21, and pt stated she never started Praluent injections because she already takes 2 other injections.  She is intolerant to atorvastatin 20 mg daily and 3x/week, simvastatin 40 mg daily, rosuvastatin 5mg  daily and 3x/week (myalgias with all). Pt requires ~53% LDL lowering to bring LDL to aggressive goal < 55 given her history of CAD, PAD, and DM. PCSK9i would be the most effective option to bring her LDL to goal, but Nexlizet may bring her close (can see ~50% LDL lowering in patients not on statin therapy).   Spoke with pt about these options, she is willing to start Praluent injections twice monthly. Scheduled follow up labs in 3 months to assess efficacy. If she does not tolerate, will plan to try Nexlizet ([place] approved through 05/31/23 and pt would qualify for copay card).

## 2020-06-10 ENCOUNTER — Encounter: Payer: Self-pay | Admitting: Internal Medicine

## 2020-06-10 ENCOUNTER — Ambulatory Visit (INDEPENDENT_AMBULATORY_CARE_PROVIDER_SITE_OTHER): Payer: BC Managed Care – PPO | Admitting: Internal Medicine

## 2020-06-10 ENCOUNTER — Other Ambulatory Visit: Payer: Self-pay

## 2020-06-10 VITALS — BP 120/66 | HR 72 | Temp 98.1°F | Ht 63.2 in | Wt 207.4 lb

## 2020-06-10 DIAGNOSIS — I739 Peripheral vascular disease, unspecified: Secondary | ICD-10-CM

## 2020-06-10 DIAGNOSIS — E1122 Type 2 diabetes mellitus with diabetic chronic kidney disease: Secondary | ICD-10-CM | POA: Diagnosis not present

## 2020-06-10 DIAGNOSIS — M1A09X0 Idiopathic chronic gout, multiple sites, without tophus (tophi): Secondary | ICD-10-CM

## 2020-06-10 DIAGNOSIS — Z79899 Other long term (current) drug therapy: Secondary | ICD-10-CM

## 2020-06-10 DIAGNOSIS — I1310 Hypertensive heart and chronic kidney disease without heart failure, with stage 1 through stage 4 chronic kidney disease, or unspecified chronic kidney disease: Secondary | ICD-10-CM

## 2020-06-10 DIAGNOSIS — M7989 Other specified soft tissue disorders: Secondary | ICD-10-CM

## 2020-06-10 DIAGNOSIS — Z794 Long term (current) use of insulin: Secondary | ICD-10-CM

## 2020-06-10 DIAGNOSIS — N1831 Chronic kidney disease, stage 3a: Secondary | ICD-10-CM

## 2020-06-10 NOTE — Progress Notes (Signed)
I,Katawbba Wiggins,acting as a Education administrator for [name], MD.,have documented all relevant documentation on the behalf of [name], MD,as directed by  [name], MD while in the presence of [name], MD.  This visit occurred during the SARS-CoV-2 public health emergency.  Safety protocols were in place, including screening questions prior to the visit, additional usage of staff PPE, and extensive cleaning of exam room while observing appropriate contact time as indicated for disinfecting solutions.  Subjective:     Patient ID: Dawn Wood , female    DOB: [birth year] , 75 y.o.   MRN: [id number]   Chief Complaint  Patient presents with  . Diabetes  . Hypertension    HPI  Patient is here today for diabetes and blood pressure check. She reports compliance with meds. She denies headaches, chest pain and shortness of breath.   Diabetes She presents for her follow-up diabetic visit. She has type 2 diabetes mellitus. Her disease course has been stable. There are no hypoglycemic associated symptoms. Pertinent negatives for diabetes include no blurred vision. There are no hypoglycemic complications. Diabetic complications include nephropathy. Risk factors for coronary artery disease include diabetes mellitus, dyslipidemia, hypertension, obesity, sedentary lifestyle and post-menopausal. Current diabetic treatment includes insulin injections. She is following a diabetic diet. She participates in exercise intermittently. Her home blood glucose trend is fluctuating minimally. Her breakfast blood glucose is taken between 8-9 am. Her breakfast blood glucose range is generally 110-130 mg/dl. Eye exam is current.  Hypertension This is a chronic problem. The current episode started more than 1 year ago. The problem has been gradually improving since onset. Pertinent negatives include no blurred vision. Risk factors for coronary artery disease include diabetes mellitus,  dyslipidemia, sedentary lifestyle and post-menopausal state. Hypertensive end-organ damage includes kidney disease.     Past Medical History:  Diagnosis Date  . Coronary artery disease   . Diabetes mellitus without complication ([place])   . GERD (gastroesophageal reflux disease)   . Hypertension   . MI (myocardial infarction) ([place])   . Peripheral vascular disease ([place])      Family History  Problem Relation Age of Onset  . Aortic stenosis Daughter   . Heart disease Daughter        before age 74  . Hypertension Mother   . Cancer Father   . Hypertension Father   . Thyroid disease Daughter   . Breast cancer Neg Hx      Current Outpatient Medications:  .  amLODipine (NORVASC) 5 MG tablet, Take 1 tablet (5 mg total) by mouth daily., Disp: 90 tablet, Rfl: 3 .  aspirin 81 MG tablet, Take 81 mg by mouth daily., Disp: , Rfl:  .  BD PEN NEEDLE NANO 2ND GEN 32G X 4 MM MISC, USE WITH INSULIN PEN TWICE DAILY AS DIRECTED, Disp: 100 each, Rfl: 2 .  Cholecalciferol (VITAMIN D3) 2000 units TABS, Take 1 tablet by mouth daily., Disp: , Rfl:  .  clopidogrel (PLAVIX) 75 MG tablet, Take 1 tablet (75 mg total) by mouth daily., Disp: 90 tablet, Rfl: 3 .  colchicine 0.6 MG tablet, Take 1 tablet by mouth daily. If you experience a flare, increase to 2 tablets by mouth daily., Disp: 60 tablet, Rfl: 2 .  dexlansoprazole (DEXILANT) 60 MG capsule, Take 1 capsule (60 mg total) by mouth daily., Disp: 30 capsule, Rfl: 1 .  fluticasone (FLONASE) 50 MCG/ACT nasal spray, Place 1 spray into both nostrils daily as needed for allergies or  rhinitis., Disp: 16 g, Rfl: 2 .  nitroGLYCERIN (NITROSTAT) 0.4 MG SL tablet, Place 1 tablet (0.4 mg total) under the tongue every 5 (five) minutes as needed for chest pain., Disp: 25 tablet, Rfl: 3 .  NOVOLOG MIX 70/30 FLEXPEN (70-30) 100 UNIT/ML FlexPen, INJECT SUBCUTANEOUS 40 UNITS IN THE MORNING AND 45 UNITS AT BEDTIME, Disp: 75 mL, Rfl: 2 .  olmesartan (BENICAR) 40 MG tablet, TAKE 1  TABLET(40 MG) BY MOUTH DAILY, Disp: 90 tablet, Rfl: 2 .  ONETOUCH VERIO test strip, Use as instructed to check blood sugars 2 times per day dx: e11.65, Disp: 100 strip, Rfl: 3 .  Semaglutide,0.25 or 0.5MG/DOS, (OZEMPIC, 0.25 OR 0.5 MG/DOSE,) 2 MG/1.5ML SOPN, INJECT 0.5MG INTO SKIN ONCE A WEEK, Disp: 4.5 mL, Rfl: 1 .  spironolactone-hydrochlorothiazide (ALDACTAZIDE) 25-25 MG tablet, Take 1 tablet by mouth daily., Disp: 90 tablet, Rfl: 3 .  Alirocumab (PRALUENT) 75 MG/ML SOAJ, Inject 75 mg into the skin every 14 (fourteen) days. (Patient not taking: Reported on 06/10/2020), Disp: 2 mL, Rfl: 11 .  metoprolol succinate (TOPROL-XL) 100 MG 24 hr tablet, TAKE 1 TABLET BY MOUTH IMMEDIATLEY FOLLOWING MEAL, Disp: 90 tablet, Rfl: 1   Allergies  Allergen Reactions  . Dust Mite Extract Other (See Comments)    REACTION: SNEEZING  . Other Other (See Comments)    ALLERGEN: DEODORIZERS SPRAY AND PERFUMES REACTION: SNEEZING  . Statins Other (See Comments)    Muscle aches with rosuvastatin 5 mg daily and 3x/weekly, atorvastatin 20 mg daily and 3x/week, simvastatin 40 mg daily   . Allopurinol     Diarrhea and the pt said it made her sugars low  . Pollen Extract Other (See Comments)    Reaction unknown     Review of Systems  Constitutional: Negative.   Eyes: Negative for blurred vision.  Respiratory: Negative.   Cardiovascular: Negative.   Gastrointestinal: Negative.   Musculoskeletal: Positive for arthralgias.       She admits to recent gout attack in March. This was triggered by eating fish.   Psychiatric/Behavioral: Negative.   All other systems reviewed and are negative.    Today's Vitals   06/10/20 1429  BP: 120/66  Pulse: 72  Temp: 98.1 F (36.7 C)  TempSrc: Oral  Weight: 207 lb 6.4 oz (94.1 kg)  Height: 5' 3.2" (1.605 m)  PainSc: 5   PainLoc: Knee   Body mass index is 36.51 kg/m.  Wt Readings from Last 3 Encounters:  06/10/20 207 lb 6.4 oz (94.1 kg)  02/11/20 206 lb 12.8 oz (93.8  kg)  01/11/20 212 lb (96.2 kg)   Objective:  Physical Exam Vitals and nursing note reviewed.  Constitutional:      Appearance: Normal appearance.  HENT:     Head: Normocephalic and atraumatic.     Nose:     Comments: Masked     Mouth/Throat:     Comments: Masked  Cardiovascular:     Rate and Rhythm: Normal rate and regular rhythm.     Heart sounds: Normal heart sounds.  Pulmonary:     Effort: Pulmonary effort is normal.     Breath sounds: Normal breath sounds.  Musculoskeletal:     Cervical back: Normal range of motion.  Skin:    General: Skin is warm.  Neurological:     General: No focal deficit present.     Mental Status: She is alert.  Psychiatric:        Mood and Affect: Mood normal.  Behavior: Behavior normal.         Assessment And Plan:     1. Type 2 diabetes mellitus with stage 3a chronic kidney disease, with long-term current use of insulin (HCC) Comments: Chronic, I will check labs as listed below. She is encouraged to aim for at least 30 minutes of exercise five days per week. I will check Renal labs as well. - Hemoglobin A1c - BMP8+EGFR - Protein electrophoresis, serum - Phosphorus - Parathyroid Hormone, Intact w/Ca  2. Hypertensive heart and renal disease with renal failure, stage 1 through stage 4 or unspecified chronic kidney disease, without heart failure Comments: Chronic, well controlled. She is encouraged to follow low sodium diet.   3. PVD (peripheral vascular disease) ([place]) Comments: She is encouraged to follow heart healthy lifestyle-advised to exercise, limit intake of fried foods, increase fiber and to incorporate stress mgmt techniques.  4. Idiopathic chronic gout of multiple sites without tophus Comments: Chronic, encouraged to avoid known triggers and to stay well hydrated. I will check uric acid levels.  - Uric acid  5. Class 2 severe obesity with body mass index (BMI) of 35 to 39.9 with serious comorbidity (HCC) Comments: She  is encouraged to strive for BMI less than 30 to decrease cardiac risk. Again, importance of regular exercise was discussed with the patient.  6. Drug therapy   Patient was given opportunity to ask questions. Patient verbalized understanding of the plan and was able to repeat key elements of the plan. All questions were answered to their satisfaction.   I, [name], MD, have reviewed all documentation for this visit. The documentation on 06/10/20 for the exam, diagnosis, procedures, and orders are all accurate and complete.   IF YOU HAVE BEEN REFERRED TO A SPECIALIST, IT MAY TAKE 1-2 WEEKS TO SCHEDULE/PROCESS THE REFERRAL. IF YOU HAVE NOT HEARD FROM US/SPECIALIST IN TWO WEEKS, PLEASE GIVE [place] A CALL AT [phone].   THE PATIENT IS ENCOURAGED TO PRACTICE SOCIAL DISTANCING DUE TO THE COVID-19 PANDEMIC.

## 2020-06-10 NOTE — Patient Instructions (Signed)
Diabetes Mellitus and Foot Care Foot care is an important part of your health, especially when you have diabetes. Diabetes may cause you to have problems because of poor blood flow (circulation) to your feet and legs, which can cause your skin to:  Become thinner and drier.  Break more easily.  Heal more slowly.  Peel and crack. You may also have nerve damage (neuropathy) in your legs and feet, causing decreased feeling in them. This means that you may not notice minor injuries to your feet that could lead to more serious problems. Noticing and addressing any potential problems early is the best way to prevent future foot problems. How to care for your feet Foot hygiene  Wash your feet daily with warm water and mild soap. Do not use hot water. Then, pat your feet and the areas between your toes until they are completely dry. Do not soak your feet as this can dry your skin.  Trim your toenails straight across. Do not dig under them or around the cuticle. File the edges of your nails with an emery board or nail file.  Apply a moisturizing lotion or petroleum jelly to the skin on your feet and to dry, brittle toenails. Use lotion that does not contain alcohol and is unscented. Do not apply lotion between your toes.   Shoes and socks  Wear clean socks or stockings every day. Make sure they are not too tight. Do not wear knee-high stockings since they may decrease blood flow to your legs.  Wear shoes that fit properly and have enough cushioning. Always look in your shoes before you put them on to be sure there are no objects inside.  To break in new shoes, wear them for just a few hours a day. This prevents injuries on your feet. Wounds, scrapes, corns, and calluses  Check your feet daily for blisters, cuts, bruises, sores, and redness. If you cannot see the bottom of your feet, use a mirror or ask someone for help.  Do not cut corns or calluses or try to remove them with medicine.  If you  find a minor scrape, cut, or break in the skin on your feet, keep it and the skin around it clean and dry. You may clean these areas with mild soap and water. Do not clean the area with peroxide, alcohol, or iodine.  If you have a wound, scrape, corn, or callus on your foot, look at it several times a day to make sure it is healing and not infected. Check for: ? Redness, swelling, or pain. ? Fluid or blood. ? Warmth. ? Pus or a bad smell.   General tips  Do not cross your legs. This may decrease blood flow to your feet.  Do not use heating pads or hot water bottles on your feet. They may burn your skin. If you have lost feeling in your feet or legs, you may not know this is happening until it is too late.  Protect your feet from hot and cold by wearing shoes, such as at the beach or on hot pavement.  Schedule a complete foot exam at least once a year (annually) or more often if you have foot problems. Report any cuts, sores, or bruises to your health care provider immediately. Where to find more information  American Diabetes Association: www.diabetes.org  Association of Diabetes Care & Education Specialists: www.diabeteseducator.org Contact a health care provider if:  You have a medical condition that increases your risk of infection and   you have any cuts, sores, or bruises on your feet.  You have an injury that is not healing.  You have redness on your legs or feet.  You feel burning or tingling in your legs or feet.  You have pain or cramps in your legs and feet.  Your legs or feet are numb.  Your feet always feel cold.  You have pain around any toenails. Get help right away if:  You have a wound, scrape, corn, or callus on your foot and: ? You have pain, swelling, or redness that gets worse. ? You have fluid or blood coming from the wound, scrape, corn, or callus. ? Your wound, scrape, corn, or callus feels warm to the touch. ? You have pus or a bad smell coming from  the wound, scrape, corn, or callus. ? You have a fever. ? You have a red line going up your leg. Summary  Check your feet every day for blisters, cuts, bruises, sores, and redness.  Apply a moisturizing lotion or petroleum jelly to the skin on your feet and to dry, brittle toenails.  Wear shoes that fit properly and have enough cushioning.  If you have foot problems, report any cuts, sores, or bruises to your health care provider immediately.  Schedule a complete foot exam at least once a year (annually) or more often if you have foot problems. This information is not intended to replace advice given to you by your health care provider. Make sure you discuss any questions you have with your health care provider. Document Revised: 09/13/2019 Document Reviewed: 09/13/2019 Elsevier Patient Education  2021 [employer]  

## 2020-06-11 ENCOUNTER — Other Ambulatory Visit: Payer: Self-pay | Admitting: Internal Medicine

## 2020-06-12 LAB — HEMOGLOBIN A1C
Est. average glucose Bld gHb Est-mCnc: 143 mg/dL
Hgb A1c MFr Bld: 6.6 % — ABNORMAL HIGH (ref 4.8–5.6)

## 2020-06-12 LAB — PROTEIN ELECTROPHORESIS, SERUM
A/G Ratio: 1.1 (ref 0.7–1.7)
Albumin ELP: 3.6 g/dL (ref 2.9–4.4)
Alpha 1: 0.2 g/dL (ref 0.0–0.4)
Alpha 2: 0.8 g/dL (ref 0.4–1.0)
Beta: 1.5 g/dL — ABNORMAL HIGH (ref 0.7–1.3)
Gamma Globulin: 0.9 g/dL (ref 0.4–1.8)
Globulin, Total: 3.4 g/dL (ref 2.2–3.9)
Total Protein: 7 g/dL (ref 6.0–8.5)

## 2020-06-12 LAB — BMP8+EGFR
BUN/Creatinine Ratio: 18 (ref 12–28)
BUN: 20 mg/dL (ref 8–27)
CO2: 23 mmol/L (ref 20–29)
Calcium: 9.3 mg/dL (ref 8.7–10.3)
Chloride: 101 mmol/L (ref 96–106)
Creatinine, Ser: 1.09 mg/dL — ABNORMAL HIGH (ref 0.57–1.00)
Glucose: 134 mg/dL — ABNORMAL HIGH (ref 65–99)
Potassium: 4.2 mmol/L (ref 3.5–5.2)
Sodium: 141 mmol/L (ref 134–144)
eGFR: 53 mL/min/{1.73_m2} — ABNORMAL LOW (ref >59)

## 2020-06-12 LAB — PTH, INTACT AND CALCIUM: PTH: 40 pg/mL (ref 15–65)

## 2020-06-12 LAB — URIC ACID: Uric Acid: 8.7 mg/dL — ABNORMAL HIGH (ref 3.1–7.9)

## 2020-06-12 LAB — PHOSPHORUS: Phosphorus: 3.5 mg/dL (ref 3.0–4.3)

## 2020-06-16 ENCOUNTER — Telehealth: Payer: Self-pay | Admitting: Interventional Cardiology

## 2020-06-16 NOTE — Telephone Encounter (Signed)
Pt c/o medication issue:  1. Name of Medication: Alirocumab (PRALUENT) 75 MG/ML SOAJ  2. How are you currently taking this medication (dosage and times per day)?   3. Are you having a reaction (difficulty breathing--STAT)?   4. What is your medication issue? Patient wanted to know if she could come in to the office to get the shot.

## 2020-06-16 NOTE — Telephone Encounter (Signed)
Patient will come to the office this afternoon at 4:00 for [place] to walk her through how to inject Praluent.

## 2020-06-16 NOTE — Telephone Encounter (Signed)
Pt presented for Praluent injection training. First injection successfully given by pt into the abdomen. Follow up labs already scheduled in June to assess efficacy.

## 2020-06-18 ENCOUNTER — Ambulatory Visit
Admission: RE | Admit: 2020-06-18 | Discharge: 2020-06-18 | Disposition: A | Discharge status: Home / Self Care | Payer: BC Managed Care – PPO | Source: Ambulatory Visit | Attending: Internal Medicine | Admitting: Internal Medicine

## 2020-06-18 ENCOUNTER — Other Ambulatory Visit: Payer: Self-pay

## 2020-06-18 DIAGNOSIS — Z0000 Encounter for general adult medical examination without abnormal findings: Secondary | ICD-10-CM

## 2020-07-07 ENCOUNTER — Other Ambulatory Visit: Payer: Self-pay | Admitting: Internal Medicine

## 2020-08-18 ENCOUNTER — Other Ambulatory Visit: Payer: BC Managed Care – PPO

## 2020-08-18 ENCOUNTER — Other Ambulatory Visit: Payer: Self-pay

## 2020-08-18 DIAGNOSIS — E785 Hyperlipidemia, unspecified: Secondary | ICD-10-CM

## 2020-08-18 LAB — HEPATIC FUNCTION PANEL
ALT: 11 IU/L (ref 0–32)
AST: 10 IU/L (ref 0–40)
Albumin: 4 g/dL (ref 3.7–4.7)
Alkaline Phosphatase: 71 IU/L (ref 44–121)
Bilirubin Total: 0.8 mg/dL (ref 0.0–1.2)
Bilirubin, Direct: 0.2 mg/dL (ref 0.00–0.40)
Total Protein: 7 g/dL (ref 6.0–8.5)

## 2020-08-18 LAB — LIPID PANEL
Chol/HDL Ratio: 2.4 ratio (ref 0.0–4.4)
Cholesterol, Total: 109 mg/dL (ref 100–199)
HDL: 45 mg/dL (ref >39)
LDL Chol Calc (NIH): 44 mg/dL (ref 0–99)
Triglycerides: 108 mg/dL (ref 0–149)
VLDL Cholesterol Cal: 20 mg/dL (ref 5–40)

## 2020-08-26 ENCOUNTER — Telehealth: Payer: Self-pay | Admitting: Interventional Cardiology

## 2020-08-26 NOTE — Telephone Encounter (Signed)
I notified the pt that I do not see where anyone called her today but I see that Dr. Katrinka Blazing replied on her labs re: the Plavix.     Lyn Records, MD  08/25/2020  2:47 PM EDT Back to Top     Let the patient know the lipids are beautiful. Okay to stop aspirin, but prefershe continue Plavix. A copy will be sent to Dorothyann Peng, MD    The reason she is asking to stop Plavix is because she wants to take something for inflammation/aches/pains.  Dr. Allyne Gee has asked her not to take Tylenol because it builds up inflammation in her stomach.    She has taken Mobic in the past for this and it was helpful.    Also states she was taking Omega XL that she got in the mail - saw on TV, no prescription - for inflammation and it helped some but had to stop this because of Plavix also.  She is aware I will forward to Dr. Steve Rattler and his nurse for review as well as PharmD input re: options/suggestions if she cannot use anti inflammatories

## 2020-08-26 NOTE — Telephone Encounter (Signed)
Patient should be able to take tylenol and it should not have any interaction with her Plavix

## 2020-08-26 NOTE — Telephone Encounter (Signed)
Dawn Wood is calling stating she is returning a call that she just received from the office. States no VM was left and I was unable to find documentation in regards to it. Please advise.

## 2020-08-27 NOTE — Telephone Encounter (Signed)
Patient notified.  She will follow up with PCP

## 2020-08-27 NOTE — Telephone Encounter (Signed)
Recommend pt avoid NSAIDs due to concomitant Plavix use and hx of CAD. She should clarify Tylenol with PCP, her listed reason for avoiding Tylenol doesn't really make sense as it does not "build up inflammation in the stomach." I also don't see that her PCP mentioned avoiding Tylenol in her notes. If this is the case, she should discuss alternative pain medications with PCP like tramadol, etc.

## 2020-08-27 NOTE — Telephone Encounter (Signed)
I spoke with patient and gave her information from pharmacist.  Patient states her PCP told her not to take tylenol.   She has arthritis and aches/pains and is asking if there is anything she can take for this while being on Plavix.

## 2020-09-14 ENCOUNTER — Other Ambulatory Visit: Payer: Self-pay | Admitting: Internal Medicine

## 2020-09-15 ENCOUNTER — Other Ambulatory Visit: Payer: Self-pay | Admitting: Internal Medicine

## 2020-09-30 ENCOUNTER — Telehealth: Payer: Self-pay | Admitting: Interventional Cardiology

## 2020-09-30 NOTE — Telephone Encounter (Signed)
Patient calling to speak with Dr. Michaelle Copas nurse. Did not want to clarify what it was about, just stated, "just give her my number"

## 2020-09-30 NOTE — Telephone Encounter (Signed)
Spoke with pt and she wanted to see about getting a handicap placard due to hip pain.  States she previously got one from PCP and they would only fill it out for 6 months at a time and she doesn't want to have to go through this every 6 months.  Explained to pt that we would not fill out a handicap placard form due to reason for needing it not being a cardiac reason.  Pt verbalized understanding.

## 2020-10-14 ENCOUNTER — Ambulatory Visit: Payer: BC Managed Care – PPO | Admitting: Internal Medicine

## 2020-10-14 ENCOUNTER — Encounter: Payer: Self-pay | Admitting: Internal Medicine

## 2020-10-14 ENCOUNTER — Other Ambulatory Visit: Payer: Self-pay

## 2020-10-14 VITALS — BP 132/70 | HR 85 | Temp 98.8°F | Ht 63.2 in | Wt 210.4 lb

## 2020-10-14 DIAGNOSIS — Z0000 Encounter for general adult medical examination without abnormal findings: Secondary | ICD-10-CM | POA: Diagnosis not present

## 2020-10-14 DIAGNOSIS — N3946 Mixed incontinence: Secondary | ICD-10-CM | POA: Diagnosis not present

## 2020-10-14 DIAGNOSIS — H6123 Impacted cerumen, bilateral: Secondary | ICD-10-CM | POA: Diagnosis not present

## 2020-10-14 DIAGNOSIS — E1122 Type 2 diabetes mellitus with diabetic chronic kidney disease: Secondary | ICD-10-CM

## 2020-10-14 DIAGNOSIS — N1831 Chronic kidney disease, stage 3a: Secondary | ICD-10-CM

## 2020-10-14 DIAGNOSIS — I1310 Hypertensive heart and chronic kidney disease without heart failure, with stage 1 through stage 4 chronic kidney disease, or unspecified chronic kidney disease: Secondary | ICD-10-CM

## 2020-10-14 DIAGNOSIS — Z794 Long term (current) use of insulin: Secondary | ICD-10-CM | POA: Diagnosis not present

## 2020-10-14 DIAGNOSIS — Z79899 Other long term (current) drug therapy: Secondary | ICD-10-CM

## 2020-10-14 DIAGNOSIS — M2550 Pain in unspecified joint: Secondary | ICD-10-CM

## 2020-10-14 DIAGNOSIS — Z6837 Body mass index (BMI) 37.0-37.9, adult: Secondary | ICD-10-CM

## 2020-10-14 LAB — POCT URINALYSIS DIPSTICK
Bilirubin, UA: NEGATIVE
Blood, UA: NEGATIVE
Glucose, UA: NEGATIVE
Ketones, UA: NEGATIVE
Leukocytes, UA: NEGATIVE
Nitrite, UA: POSITIVE
Protein, UA: NEGATIVE
Spec Grav, UA: 1.025 (ref 1.010–1.025)
Urobilinogen, UA: 0.2 E.U./dL
pH, UA: 5.5 (ref 5.0–8.0)

## 2020-10-14 NOTE — Patient Instructions (Addendum)
Health Maintenance, Female Adopting a healthy lifestyle and getting preventive care are important in promoting health and wellness. Ask your health care provider about: The right schedule for you to have regular tests and exams. Things you can do on your own to prevent diseases and keep yourself healthy. What should I know about diet, weight, and exercise? Eat a healthy diet  Eat a diet that includes plenty of vegetables, fruits, low-fat dairy products, and lean protein. Do not eat a lot of foods that are high in solid fats, added sugars, or sodium.  Maintain a healthy weight Body mass index (BMI) is used to identify weight problems. It estimates body fat based on height and weight. Your health care provider can help determineyour BMI and help you achieve or maintain a healthy weight. Get regular exercise Get regular exercise. This is one of the most important things you can do for your health. Most adults should: Exercise for at least 150 minutes each week. The exercise should increase your heart rate and make you sweat (moderate-intensity exercise). Do strengthening exercises at least twice a week. This is in addition to the moderate-intensity exercise. Spend less time sitting. Even light physical activity can be beneficial. Watch cholesterol and blood lipids Have your blood tested for lipids and cholesterol at 76 years of age, then havethis test every 5 years. Have your cholesterol levels checked more often if: Your lipid or cholesterol levels are high. You are older than 76 years of age. You are at high risk for heart disease. What should I know about cancer screening? Depending on your health history and family history, you may need to have cancer screening at various ages. This may include screening for: Breast cancer. Cervical cancer. Colorectal cancer. Skin cancer. Lung cancer. What should I know about heart disease, diabetes, and high blood pressure? Blood pressure and heart  disease High blood pressure causes heart disease and increases the risk of stroke. This is more likely to develop in people who have high blood pressure readings, are of African descent, or are overweight. Have your blood pressure checked: Every 3-5 years if you are 18-39 years of age. Every year if you are 40 years old or older. Diabetes Have regular diabetes screenings. This checks your fasting blood sugar level. Have the screening done: Once every three years after age 40 if you are at a normal weight and have a low risk for diabetes. More often and at a younger age if you are overweight or have a high risk for diabetes. What should I know about preventing infection? Hepatitis B If you have a higher risk for hepatitis B, you should be screened for this virus. Talk with your health care provider to find out if you are at risk forhepatitis B infection. Hepatitis C Testing is recommended for: Everyone born from 1945 through 1965. Anyone with known risk factors for hepatitis C. Sexually transmitted infections (STIs) Get screened for STIs, including gonorrhea and chlamydia, if: You are sexually active and are younger than 76 years of age. You are older than 76 years of age and your health care provider tells you that you are at risk for this type of infection. Your sexual activity has changed since you were last screened, and you are at increased risk for chlamydia or gonorrhea. Ask your health care provider if you are at risk. Ask your health care provider about whether you are at high risk for HIV. Your health care provider may recommend a prescription medicine to help   prevent HIV infection. If you choose to take medicine to prevent HIV, you should first get tested for HIV. You should then be tested every 3 months for as long as you are taking the medicine. Pregnancy If you are about to stop having your period (premenopausal) and you may become pregnant, seek counseling before you get  pregnant. Take 400 to 800 micrograms (mcg) of folic acid every day if you become pregnant. Ask for birth control (contraception) if you want to prevent pregnancy. Osteoporosis and menopause Osteoporosis is a disease in which the bones lose minerals and strength with aging. This can result in bone fractures. If you are 5 years old or older, or if you are at risk for osteoporosis and fractures, ask your health care provider if you should: Be screened for bone loss. Take a calcium or vitamin D supplement to lower your risk of fractures. Be given hormone replacement therapy (HRT) to treat symptoms of menopause. Follow these instructions at home: Lifestyle Do not use any products that contain nicotine or tobacco, such as cigarettes, e-cigarettes, and chewing tobacco. If you need help quitting, ask your health care provider. Do not use street drugs. Do not share needles. Ask your health care provider for help if you need support or information about quitting drugs. Alcohol use Do not drink alcohol if: Your health care provider tells you not to drink. You are pregnant, may be pregnant, or are planning to become pregnant. If you drink alcohol: Limit how much you use to 0-1 drink a day. Limit intake if you are breastfeeding. Be aware of how much alcohol is in your drink. In the [place] one drink equals one 12 oz bottle of beer (355 mL), one 5 oz glass of wine (148 mL), or one 1 oz glass of hard liquor (44 mL). General instructions Schedule regular health, dental, and eye exams. Stay current with your vaccines. Tell your health care provider if: You often feel depressed. You have ever been abused or do not feel safe at home. Summary Adopting a healthy lifestyle and getting preventive care are important in promoting health and wellness. Follow your health care provider's instructions about healthy diet, exercising, and getting tested or screened for diseases. Follow your health care provider's  instructions on monitoring your cholesterol and blood pressure. This information is not intended to replace advice given to you by your health care provider. Make sure you discuss any questions you have with your healthcare provider. Document Revised: 02/15/2018 Document Reviewed: 02/15/2018 Elsevier Patient Education  2022 [employer]   Diabetes Mellitus and Exercise Exercising regularly is important for overall health, especially for people who have diabetes mellitus. Exercising is not only about losing weight. It has many other health benefits, such as increasing muscle strength and bone density and reducing body fat and stress. This leads to improved fitness, flexibility, andendurance, all of which result in better overall health. What are the benefits of exercise if I have diabetes? Exercise has many benefits for people with diabetes. They include: Helping to lower and control blood sugar (glucose). Helping the body to respond better to the hormone insulin by improving insulin sensitivity. Reducing how much insulin the body needs. Lowering the risk for heart disease by: Lowering "bad" cholesterol and triglyceride levels. Increasing "good" cholesterol levels. Lowering blood pressure. Lowering blood glucose levels. What is my activity plan? Your health care provider or certified diabetes educator can help you make a plan for the type and frequency of exercise that works for you.  This is called your activity plan. Be sure to: Get at least 150 minutes of medium-intensity or high-intensity exercise each week. Exercises may include brisk walking, biking, or water aerobics. Do stretching and strengthening exercises, such as yoga or weight lifting, at least 2 times a week. Spread out your activity over at least 3 days of the week. Get some form of physical activity each day. Do not go more than 2 days in a row without some kind of physical activity. Avoid being inactive for more than 90  minutes at a time. Take frequent breaks to walk or stretch. Choose exercises or activities that you enjoy. Set realistic goals. Start slowly and gradually increase your exercise intensity over time. How do I manage my diabetes during exercise?  Monitor your blood glucose Check your blood glucose before and after exercising. If your blood glucose is: 240 mg/dL (05.3 mmol/L) or higher before you exercise, check your urine for ketones. These are chemicals created by the liver. If you have ketones in your urine, do not exercise until your blood glucose returns to normal. 100 mg/dL (5.6 mmol/L) or lower, eat a snack containing 15-20 grams of carbohydrate. Check your blood glucose 15 minutes after the snack to make sure that your glucose level is above 100 mg/dL (5.6 mmol/L) before you start your exercise. Know the symptoms of low blood glucose (hypoglycemia) and how to treat it. Your risk for hypoglycemia increases during and after exercise. Follow these tips and your health care provider's instructions Keep a carbohydrate snack that is fast-acting for use before, during, and after exercise to help prevent or treat hypoglycemia. Avoid injecting insulin into areas of the body that are going to be exercised. For example, avoid injecting insulin into: Your arms, when you are about to play tennis. Your legs, when you are about to go jogging. Keep records of your exercise habits. Doing this can help you and your health care provider adjust your diabetes management plan as needed. Write down: Food that you eat before and after you exercise. Blood glucose levels before and after you exercise. The type and amount of exercise you have done. Work with your health care provider when you start a new exercise or activity. He or she may need to: Make sure that the activity is safe for you. Adjust your insulin, other medicines, and food that you eat. Drink plenty of water while you exercise. This prevents loss of  water (dehydration) and problems caused by a lot of heat in the body (heat stroke). Where to find more information American Diabetes Association: www.diabetes.org Summary Exercising regularly is important for overall health, especially for people who have diabetes mellitus. Exercising has many health benefits. It increases muscle strength and bone density and reduces body fat and stress. It also lowers and controls blood glucose. Your health care provider or certified diabetes educator can help you make an activity plan for the type and frequency of exercise that works for you. Work with your health care provider to make sure any new activity is safe for you. Also work with your health care provider to adjust your insulin, other medicines, and the food you eat. This information is not intended to replace advice given to you by your health care provider. Make sure you discuss any questions you have with your healthcare provider. Document Revised: 11/20/2018 Document Reviewed: 11/20/2018 Elsevier Patient Education  2022 [employer] Health Maintenance, Female Adopting a healthy lifestyle and getting preventive care are important in promoting health and  wellness. Ask your health care provider about: The right schedule for you to have regular tests and exams. Things you can do on your own to prevent diseases and keep yourself healthy. What should I know about diet, weight, and exercise? Eat a healthy diet  Eat a diet that includes plenty of vegetables, fruits, low-fat dairy products, and lean protein. Do not eat a lot of foods that are high in solid fats, added sugars, or sodium.  Maintain a healthy weight Body mass index (BMI) is used to identify weight problems. It estimates body fat based on height and weight. Your health care provider can help determineyour BMI and help you achieve or maintain a healthy weight. Get regular exercise Get regular exercise. This is one of the most important  things you can do for your health. Most adults should: Exercise for at least 150 minutes each week. The exercise should increase your heart rate and make you sweat (moderate-intensity exercise). Do strengthening exercises at least twice a week. This is in addition to the moderate-intensity exercise. Spend less time sitting. Even light physical activity can be beneficial. Watch cholesterol and blood lipids Have your blood tested for lipids and cholesterol at 76 years of age, then havethis test every 5 years. Have your cholesterol levels checked more often if: Your lipid or cholesterol levels are high. You are older than 76 years of age. You are at high risk for heart disease. What should I know about cancer screening? Depending on your health history and family history, you may need to have cancer screening at various ages. This may include screening for: Breast cancer. Cervical cancer. Colorectal cancer. Skin cancer. Lung cancer. What should I know about heart disease, diabetes, and high blood pressure? Blood pressure and heart disease High blood pressure causes heart disease and increases the risk of stroke. This is more likely to develop in people who have high blood pressure readings, are of African descent, or are overweight. Have your blood pressure checked: Every 3-5 years if you are 4618-76 years of age. Every year if you are 76 years old or older. Diabetes Have regular diabetes screenings. This checks your fasting blood sugar level. Have the screening done: Once every three years after age 740 if you are at a normal weight and have a low risk for diabetes. More often and at a younger age if you are overweight or have a high risk for diabetes. What should I know about preventing infection? Hepatitis B If you have a higher risk for hepatitis B, you should be screened for this virus. Talk with your health care provider to find out if you are at risk forhepatitis B infection. Hepatitis  C Testing is recommended for: Everyone born from [id number] through 1965. Anyone with known risk factors for hepatitis C. Sexually transmitted infections (STIs) Get screened for STIs, including gonorrhea and chlamydia, if: You are sexually active and are younger than 76 years of age. You are older than 76 years of age and your health care provider tells you that you are at risk for this type of infection. Your sexual activity has changed since you were last screened, and you are at increased risk for chlamydia or gonorrhea. Ask your health care provider if you are at risk. Ask your health care provider about whether you are at high risk for HIV. Your health care provider may recommend a prescription medicine to help prevent HIV infection. If you choose to take medicine to prevent HIV, you should first get  tested for HIV. You should then be tested every 3 months for as long as you are taking the medicine. Pregnancy If you are about to stop having your period (premenopausal) and you may become pregnant, seek counseling before you get pregnant. Take 400 to 800 micrograms (mcg) of folic acid every day if you become pregnant. Ask for birth control (contraception) if you want to prevent pregnancy. Osteoporosis and menopause Osteoporosis is a disease in which the bones lose minerals and strength with aging. This can result in bone fractures. If you are 26 years old or older, or if you are at risk for osteoporosis and fractures, ask your health care provider if you should: Be screened for bone loss. Take a calcium or vitamin D supplement to lower your risk of fractures. Be given hormone replacement therapy (HRT) to treat symptoms of menopause. Follow these instructions at home: Lifestyle Do not use any products that contain nicotine or tobacco, such as cigarettes, e-cigarettes, and chewing tobacco. If you need help quitting, ask your health care provider. Do not use street drugs. Do not share needles. Ask  your health care provider for help if you need support or information about quitting drugs. Alcohol use Do not drink alcohol if: Your health care provider tells you not to drink. You are pregnant, may be pregnant, or are planning to become pregnant. If you drink alcohol: Limit how much you use to 0-1 drink a day. Limit intake if you are breastfeeding. Be aware of how much alcohol is in your drink. In the [place] one drink equals one 12 oz bottle of beer (355 mL), one 5 oz glass of wine (148 mL), or one 1 oz glass of hard liquor (44 mL). General instructions Schedule regular health, dental, and eye exams. Stay current with your vaccines. Tell your health care provider if: You often feel depressed. You have ever been abused or do not feel safe at home. Summary Adopting a healthy lifestyle and getting preventive care are important in promoting health and wellness. Follow your health care provider's instructions about healthy diet, exercising, and getting tested or screened for diseases. Follow your health care provider's instructions on monitoring your cholesterol and blood pressure. This information is not intended to replace advice given to you by your health care provider. Make sure you discuss any questions you have with your healthcare provider. Document Revised: 02/15/2018 Document Reviewed: 02/15/2018 Elsevier Patient Education  2022 [employer].

## 2020-10-14 NOTE — Progress Notes (Signed)
I,[name],acting as a Education administrator for [name], MD.,have documented all relevant documentation on the behalf of [name], MD,as directed by  [name], MD while in the presence of [name], MD.  This visit occurred during the SARS-CoV-2 public health emergency.  Safety protocols were in place, including screening questions prior to the visit, additional usage of staff PPE, and extensive cleaning of exam room while observing appropriate contact time as indicated for disinfecting solutions.  Subjective:     Patient ID: Dawn Wood , female    DOB: [birth year] , 75 y.o.   MRN: [id number]   Chief Complaint  Patient presents with   Hypertension   Diabetes   Annual Exam    HPI  Patient is here today for CPE. She was originally scheduled for [facility], but she is still working.   She reports compliance with meds. She denies headaches, chest pain and shortness of breath.   Hypertension This is a chronic problem. The current episode started more than 1 year ago. The problem has been gradually improving since onset. Pertinent negatives include no blurred vision. Risk factors for coronary artery disease include diabetes mellitus, dyslipidemia, sedentary lifestyle and post-menopausal state. Hypertensive end-organ damage includes kidney disease.  Diabetes She presents for her follow-up diabetic visit. She has type 2 diabetes mellitus. Her disease course has been stable. There are no hypoglycemic associated symptoms. Pertinent negatives for diabetes include no blurred vision. There are no hypoglycemic complications. Diabetic complications include nephropathy. Risk factors for coronary artery disease include diabetes mellitus, dyslipidemia, hypertension, obesity, sedentary lifestyle and post-menopausal. Current diabetic treatment includes insulin injections. She is following a diabetic diet. She participates in exercise intermittently. Her home blood glucose trend is  fluctuating minimally. Her breakfast blood glucose is taken between 8-9 am. Her breakfast blood glucose range is generally 110-130 mg/dl. Eye exam is current.    Past Medical History:  Diagnosis Date   Coronary artery disease    Diabetes mellitus without complication (HCC)    GERD (gastroesophageal reflux disease)    Hypertension    MI (myocardial infarction) ([place])    Peripheral vascular disease ([place])      Family History  Problem Relation Age of Onset   Aortic stenosis Daughter    Heart disease Daughter        before age 56   Hypertension Mother    Cancer Father    Hypertension Father    Thyroid disease Daughter    Breast cancer Neg Hx      Current Outpatient Medications:    Alirocumab (PRALUENT) 75 MG/ML SOAJ, Inject 75 mg into the skin every 14 (fourteen) days., Disp: 2 mL, Rfl: 11   amLODipine (NORVASC) 5 MG tablet, Take 1 tablet (5 mg total) by mouth daily., Disp: 90 tablet, Rfl: 3   aspirin 81 MG tablet, Take 81 mg by mouth daily., Disp: , Rfl:    BD PEN NEEDLE NANO 2ND GEN 32G X 4 MM MISC, USE WITH INSULIN PEN TWICE DAILY AS DIRECTED, Disp: 100 each, Rfl: 2   Cholecalciferol (VITAMIN D3) 2000 units TABS, Take 1 tablet by mouth daily., Disp: , Rfl:    clopidogrel (PLAVIX) 75 MG tablet, Take 1 tablet (75 mg total) by mouth daily., Disp: 90 tablet, Rfl: 3   colchicine 0.6 MG tablet, Take 1 tablet by mouth daily. If you experience a flare, increase to 2 tablets by mouth daily., Disp: 60 tablet, Rfl: 2   dexlansoprazole (DEXILANT) 60 MG capsule,  Take 1 capsule (60 mg total) by mouth daily., Disp: 30 capsule, Rfl: 1   fluticasone (FLONASE) 50 MCG/ACT nasal spray, Place 1 spray into both nostrils daily as needed for allergies or rhinitis., Disp: 16 g, Rfl: 2   metoprolol succinate (TOPROL-XL) 100 MG 24 hr tablet, TAKE 1 TABLET BY MOUTH IMMEDIATLEY FOLLOWING MEAL, Disp: 90 tablet, Rfl: 1   nitroGLYCERIN (NITROSTAT) 0.4 MG SL tablet, Place 1 tablet (0.4 mg total) under the tongue  every 5 (five) minutes as needed for chest pain., Disp: 25 tablet, Rfl: 3   NOVOLOG MIX 70/30 FLEXPEN (70-30) 100 UNIT/ML FlexPen, INJECT SUBCUTANEOUS 40 UNITS IN THE MORNING AND 45 UNITS AT BEDTIME, Disp: 75 mL, Rfl: 2   NOVOLOG MIX 70/30 FLEXPEN (70-30) 100 UNIT/ML FlexPen, INJECT SUBCUTANEOUS 40 UNITS IN THE MORNING AND 45 UNITS AT BEDTIME, Disp: 75 mL, Rfl: 2   olmesartan (BENICAR) 40 MG tablet, TAKE 1 TABLET(40 MG) BY MOUTH DAILY, Disp: 90 tablet, Rfl: 2   ONETOUCH VERIO test strip, Use as instructed to check blood sugars 2 times per day dx: e11.65, Disp: 100 strip, Rfl: 3   Semaglutide,0.25 or 0.5MG/DOS, (OZEMPIC, 0.25 OR 0.5 MG/DOSE,) 2 MG/1.5ML SOPN, INJECT 0.5MG INTO SKIN ONCE A WEEK, Disp: 4.5 mL, Rfl: 1   spironolactone-hydrochlorothiazide (ALDACTAZIDE) 25-25 MG tablet, Take 1 tablet by mouth daily., Disp: 90 tablet, Rfl: 3   Allergies  Allergen Reactions   Dust Mite Extract Other (See Comments)    REACTION: SNEEZING   Other Other (See Comments)    ALLERGEN: DEODORIZERS SPRAY AND PERFUMES REACTION: SNEEZING   Statins Other (See Comments)    Muscle aches with rosuvastatin 5 mg daily and 3x/weekly, atorvastatin 20 mg daily and 3x/week, simvastatin 40 mg daily    Allopurinol     Diarrhea and the pt said it made her sugars low   Pollen Extract Other (See Comments)    Reaction unknown     Review of Systems  Constitutional: Negative.   HENT: Negative.    Eyes: Negative.  Negative for blurred vision.  Respiratory: Negative.    Cardiovascular: Negative.   Gastrointestinal: Negative.   Endocrine: Negative.   Genitourinary:  Positive for frequency and urgency.  Musculoskeletal:  Positive for arthralgias.  Skin: Negative.   Allergic/Immunologic: Negative.   Neurological: Negative.   Hematological: Negative.   Psychiatric/Behavioral: Negative.      Today's Vitals   10/14/20 1000  BP: 132/70  Pulse: 85  Temp: 98.8 F (37.1 C)  TempSrc: Oral  Weight: 210 lb 6.4 oz (95.4  kg)  Height: 5' 3.2" (1.605 m)   Body mass index is 37.04 kg/m.  Wt Readings from Last 3 Encounters:  10/14/20 210 lb 6.4 oz (95.4 kg)  06/10/20 207 lb 6.4 oz (94.1 kg)  02/11/20 206 lb 12.8 oz (93.8 kg)    Objective:  Physical Exam Vitals and nursing note reviewed.  Constitutional:      Appearance: Normal appearance. She is obese.  HENT:     Head: Normocephalic and atraumatic.     Right Ear: Ear canal and external ear normal. There is impacted cerumen.     Left Ear: Ear canal and external ear normal. There is impacted cerumen.     Nose:     Comments: Masked     Mouth/Throat:     Comments: Masked  Eyes:     Extraocular Movements: Extraocular movements intact.     Conjunctiva/sclera: Conjunctivae normal.     Pupils: Pupils are equal, round, and reactive  to light.  Cardiovascular:     Rate and Rhythm: Normal rate and regular rhythm.     Pulses:          Dorsalis pedis pulses are 1+ on the right side and 0 on the left side.     Heart sounds: Normal heart sounds.  Pulmonary:     Effort: Pulmonary effort is normal.     Breath sounds: Normal breath sounds.  Chest:  Breasts:    Tanner Score is 5.     Right: Normal.     Left: Normal.  Abdominal:     General: Abdomen is flat. Bowel sounds are normal.     Palpations: Abdomen is soft.  Genitourinary:    Comments: deferred Musculoskeletal:        General: Normal range of motion.     Cervical back: Normal range of motion and neck supple.  Feet:     Right foot:     Protective Sensation: 5 sites tested.  5 sites sensed.     Skin integrity: Dry skin present.     Toenail Condition: Right toenails are normal.     Left foot:     Protective Sensation: 5 sites tested.  5 sites sensed.     Skin integrity: Dry skin present.     Toenail Condition: Left toenails are normal.  Skin:    General: Skin is warm and dry.  Neurological:     General: No focal deficit present.     Mental Status: She is alert and oriented to person, place,  and time.  Psychiatric:        Mood and Affect: Mood normal.        Behavior: Behavior normal.        Assessment And Plan:     1. Routine general medical examination at a health care facility Comments: A full exam was performed. IMportance of monthly self breast exams was discussed with the patient. PATIENT IS ADVISED TO GET 30-45 MINUTES REGULAR EXERCISE NO LESS THAN FOUR TO FIVE DAYS PER WEEK - BOTH WEIGHTBEARING EXERCISES AND AEROBIC ARE RECOMMENDED.  PATIENT IS ADVISED TO FOLLOW A HEALTHY DIET WITH AT LEAST SIX FRUITS/VEGGIES PER DAY, DECREASE INTAKE OF RED MEAT, AND TO INCREASE FISH INTAKE TO TWO DAYS PER WEEK.  MEATS/FISH SHOULD NOT BE FRIED, BAKED OR BROILED IS PREFERABLE.  IT IS ALSO IMPORTANT TO CUT BACK ON YOUR SUGAR INTAKE. PLEASE AVOID ANYTHING WITH ADDED SUGAR, CORN SYRUP OR OTHER SWEETENERS. IF YOU MUST USE A SWEETENER, YOU CAN TRY STEVIA. IT IS ALSO IMPORTANT TO AVOID ARTIFICIALLY SWEETENERS AND DIET BEVERAGES. LASTLY, I SUGGEST WEARING SPF 50 SUNSCREEN ON EXPOSED PARTS AND ESPECIALLY WHEN IN THE DIRECT SUNLIGHT FOR AN EXTENDED PERIOD OF TIME.  PLEASE AVOID FAST FOOD RESTAURANTS AND INCREASE YOUR WATER INTAKE.  - CBC - Hemoglobin A1c - BMP8+eGFR  2. Type 2 diabetes mellitus with stage 3a chronic kidney disease, with long-term current use of insulin (HCC) Comments: Diabetic foot exam was performed. Importance of dietary and medication compliance was d/w pt. I DISCUSSED WITH THE PATIENT AT LENGTH REGARDING THE GOALS OF GLYCEMIC CONTROL AND POSSIBLE LONG-TERM COMPLICATIONS.  I  ALSO STRESSED THE IMPORTANCE OF COMPLIANCE WITH HOME GLUCOSE MONITORING, DIETARY RESTRICTIONS INCLUDING AVOIDANCE OF SUGARY DRINKS/PROCESSED FOODS,  ALONG WITH REGULAR EXERCISE.  I  ALSO STRESSED THE IMPORTANCE OF ANNUAL EYE EXAMS, SELF FOOT CARE AND COMPLIANCE WITH OFFICE VISITS.  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 Advised to follow low sodium diet. EKG not performed since one was performed w/ Cardiology.  Importance of medication and dietary compliance was again d/w pt. Advised to avoid adding salt to her foods.   4. Mixed stress and urge urinary incontinence Comments: The importance of pelvic floor strength was d/w pt. I will refer her for pelvic floor PT. She is in agreement with treatment plan. I will check u/a today to r/o - Ambulatory referral to Physical Therapy  5. Bilateral impacted cerumen AFTER OBTAINING VERBAL CONSENT, BOTH EARS WERE FLUSHED BY IRRIGATION. SHE TOLERATED PROCEDURE WELL WITHOUT ANY COMPLICATIONS. NO TM ABNORMALITIES WERE NOTED. - Ear Lavage  6. Arthralgia, unspecified joint Comments: Previously seen by Rheum, hasn't been seen in awhile. Has h/o gout. I will check arthritis panel. 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 Advised to follow anti-inflammatory diet. - ANA, IFA (with reflex) - CYCLIC CITRUL PEPTIDE ANTIBODY, IGG/IGA - Rheumatoid factor - Uric acid - Sedimentation rate  7. Class 2 severe obesity due to excess calories with serious comorbidity and body mass index (BMI) of 37.0 to 37.9 in adult [facility]) Comments:  She is encouraged to strive for BMI less than 30 to decrease cardiac risk. Advised to aim for at least 150 minutes of exercise per week.  8. Drug therapy - Vitamin B12  Patient was given opportunity to ask questions. Patient verbalized understanding of the plan and was able to repeat key elements of the plan. All questions were answered to their satisfaction.   I, [name], MD, have reviewed all documentation for this visit. The documentation on 10/14/20 for the exam, diagnosis, procedures, and orders are all accurate and complete.   IF YOU HAVE BEEN REFERRED TO A SPECIALIST, IT MAY TAKE 1-2 WEEKS TO SCHEDULE/PROCESS THE REFERRAL. IF YOU HAVE NOT HEARD FROM  US/SPECIALIST IN TWO WEEKS, PLEASE GIVE [place] A CALL AT [phone].   THE PATIENT IS ENCOURAGED TO PRACTICE SOCIAL DISTANCING DUE TO THE COVID-19 PANDEMIC.

## 2020-10-15 ENCOUNTER — Other Ambulatory Visit: Payer: Self-pay

## 2020-10-15 DIAGNOSIS — N390 Urinary tract infection, site not specified: Secondary | ICD-10-CM

## 2020-10-17 LAB — URINE CULTURE

## 2020-10-21 ENCOUNTER — Other Ambulatory Visit: Payer: Self-pay

## 2020-10-21 DIAGNOSIS — A498 Other bacterial infections of unspecified site: Secondary | ICD-10-CM

## 2020-10-21 LAB — BMP8+EGFR
BUN/Creatinine Ratio: 19 (ref 12–28)
BUN: 18 mg/dL (ref 8–27)
CO2: 21 mmol/L (ref 20–29)
Calcium: 9.3 mg/dL (ref 8.7–10.3)
Chloride: 100 mmol/L (ref 96–106)
Creatinine, Ser: 0.93 mg/dL (ref 0.57–1.00)
Glucose: 123 mg/dL — ABNORMAL HIGH (ref 65–99)
Potassium: 3.8 mmol/L (ref 3.5–5.2)
Sodium: 139 mmol/L (ref 134–144)
eGFR: 64 mL/min/{1.73_m2} (ref >59)

## 2020-10-21 LAB — CBC
Hematocrit: 42.3 % (ref 34.0–46.6)
Hemoglobin: 13.2 g/dL (ref 11.1–15.9)
MCH: 27.4 pg (ref 26.6–33.0)
MCHC: 31.2 g/dL — ABNORMAL LOW (ref 31.5–35.7)
MCV: 88 fL (ref 79–97)
Platelets: 265 10*3/uL (ref 150–450)
RBC: 4.81 x10E6/uL (ref 3.77–5.28)
RDW: 13.2 % (ref 11.7–15.4)
WBC: 9.3 10*3/uL (ref 3.4–10.8)

## 2020-10-21 LAB — FANA STAINING PATTERNS: Homogeneous Pattern: 1:80 {titer}

## 2020-10-21 LAB — CYCLIC CITRUL PEPTIDE ANTIBODY, IGG/IGA: Cyclic Citrullin Peptide Ab: 7 units (ref 0–19)

## 2020-10-21 LAB — HEMOGLOBIN A1C
Est. average glucose Bld gHb Est-mCnc: 160 mg/dL
Hgb A1c MFr Bld: 7.2 % — ABNORMAL HIGH (ref 4.8–5.6)

## 2020-10-21 LAB — SEDIMENTATION RATE: Sed Rate: 61 mm/hr — ABNORMAL HIGH (ref 0–40)

## 2020-10-21 LAB — ANTINUCLEAR ANTIBODIES, IFA: ANA Titer 1: POSITIVE — AB

## 2020-10-21 LAB — URIC ACID: Uric Acid: 9.6 mg/dL — ABNORMAL HIGH (ref 3.1–7.9)

## 2020-10-21 LAB — TSH: TSH: 1.18 u[IU]/mL (ref 0.450–4.500)

## 2020-10-21 LAB — RHEUMATOID FACTOR: Rheumatoid fact SerPl-aCnc: <10 IU/mL (ref <14.0)

## 2020-10-21 LAB — VITAMIN B12: Vitamin B-12: 285 pg/mL (ref 232–1245)

## 2020-10-21 MED ORDER — NITROFURANTOIN MONOHYD MACRO 100 MG PO CAPS
100.0000 mg | ORAL_CAPSULE | Freq: Two times a day (BID) | ORAL | 0 refills | Status: AC
Start: 2020-10-21 — End: 2020-10-26

## 2020-10-22 ENCOUNTER — Telehealth: Payer: Self-pay | Admitting: Interventional Cardiology

## 2020-10-22 NOTE — Telephone Encounter (Signed)
Pt wanting to know if ok that she receives B12 injections weekly for the next month, says PCP is recommending. PCP also wants pt to start Turmeric and wants to  know if ok as well. She mentioned because she was "on a blood thinner" she should check.  She is referring to her Plavix.  Pt aware that I think both will be fine but will send to MD for advisement. Aware we will call once he advises. Pt agreeable to plan.

## 2020-10-22 NOTE — Telephone Encounter (Signed)
Pt informed of update information/recommendation. She will hold off on Turmeric. She appreciates the follow up

## 2020-10-22 NOTE — Telephone Encounter (Signed)
   Pt is requesting to speak with a nurse, she said she has questions to ask

## 2020-10-22 NOTE — Telephone Encounter (Signed)
No issues with Vitamin B injections.  Turmeric does have possibility of increasing bleeding risk with Plavix

## 2020-10-28 ENCOUNTER — Other Ambulatory Visit: Payer: Self-pay

## 2020-10-28 ENCOUNTER — Ambulatory Visit (INDEPENDENT_AMBULATORY_CARE_PROVIDER_SITE_OTHER): Payer: BC Managed Care – PPO

## 2020-10-28 VITALS — BP 134/76 | HR 89 | Temp 98.3°F | Ht 63.2 in | Wt 206.6 lb

## 2020-10-28 DIAGNOSIS — E538 Deficiency of other specified B group vitamins: Secondary | ICD-10-CM

## 2020-10-28 MED ORDER — CYANOCOBALAMIN 1000 MCG/ML IJ SOLN
1000.0000 ug | Freq: Once | INTRAMUSCULAR | Status: AC
  Administered 2020-10-28: 1000 ug via INTRAMUSCULAR

## 2020-10-28 NOTE — Progress Notes (Signed)
The patient is here today for a vitamin b12 injection.  The patient is here for the 1st of 4 wkly injections.

## 2020-10-28 NOTE — Patient Instructions (Signed)
Vitamin B12 Deficiency Vitamin B12 deficiency means that your body does not have enough vitamin B12. The body needs this vitamin: To make red blood cells. To make genes (DNA). To help the nerves work. If you do not have enough vitamin B12 in your body, you can have health problems. What are the causes? Not eating enough foods that contain vitamin B12. Not being able to absorb vitamin B12 from the food that you eat. Certain digestive system diseases. A condition in which the body does not make enough of a certain protein, which results in too few red blood cells (pernicious anemia). Having a surgery in which part of the stomach or small intestine is removed. Taking medicines that make it hard for the body to absorb vitamin B12. These medicines include: Heartburn medicines. Some antibiotic medicines. Other medicines that are used to treat certain conditions. What increases the risk? Being older than age 50. Eating a vegetarian or vegan diet, especially while you are pregnant. Eating a poor diet while you are pregnant. Taking certain medicines. Having alcoholism. What are the signs or symptoms? In some cases, there are no symptoms. If the condition leads to too few blood cells or nerve damage, symptoms can occur, such as: Feeling weak. Feeling tired (fatigued). Not being hungry. Weight loss. A loss of feeling (numbness) or tingling in your hands and feet. Redness and burning of the tongue. Being mixed up (confused) or having memory problems. Sadness (depression). Problems with your senses. This can include color blindness, ringing in the ears, or loss of taste. Watery poop (diarrhea) or trouble pooping (constipation). Trouble walking. If anemia is very bad, symptoms can include: Being short of breath. Being dizzy. Having a very fast heartbeat. How is this treated? Changing the way you eat and drink, such as: Eating more foods that contain vitamin B12. Drinking little or no  alcohol. Getting vitamin B12 shots. Taking vitamin B12 supplements. Your doctor will tell you the dose that is best for you. Follow these instructions at home: Eating and drinking  Eat lots of healthy foods that contain vitamin B12. These include: Meats and poultry, such as beef, pork, chicken, turkey, and organ meats, such as liver. Seafood, such as clams, rainbow trout, salmon, tuna, and haddock. Eggs. Cereal and dairy products that have vitamin B12 added to them. Check the label. The items listed above may not be a complete list of what you can eat and drink. Contact a dietitian for more options. General instructions Get any shots as told by your doctor. Take supplements only as told by your doctor. Do not drink alcohol if your doctor tells you not to. In some cases, you may only be asked to limit alcohol use. Keep all follow-up visits as told by your doctor. This is important. Contact a doctor if: Your symptoms come back. Get help right away if: You have trouble breathing. You have a very fast heartbeat. You have chest pain. You get dizzy. You pass out. Summary Vitamin B12 deficiency means that your body is not getting enough vitamin B12. In some cases, there are no symptoms of this condition. Treatment may include making a change in the way you eat and drink, getting vitamin B12 shots, or taking supplements. Eat lots of healthy foods that contain vitamin B12. This information is not intended to replace advice given to you by your health care provider. Make sure you discuss any questions you have with your health care provider. Document Revised: 11/01/2017 Document Reviewed: 11/01/2017 Elsevier Patient Education    2022 [employer].  

## 2020-11-04 ENCOUNTER — Ambulatory Visit (INDEPENDENT_AMBULATORY_CARE_PROVIDER_SITE_OTHER): Payer: BC Managed Care – PPO

## 2020-11-04 ENCOUNTER — Other Ambulatory Visit: Payer: Self-pay

## 2020-11-04 VITALS — BP 126/80 | Temp 98.1°F | Ht 63.2 in | Wt 206.6 lb

## 2020-11-04 DIAGNOSIS — E538 Deficiency of other specified B group vitamins: Secondary | ICD-10-CM

## 2020-11-04 MED ORDER — CYANOCOBALAMIN 1000 MCG/ML IJ SOLN
1000.0000 ug | Freq: Once | INTRAMUSCULAR | Status: AC
  Administered 2020-11-04: 1000 ug via INTRAMUSCULAR

## 2020-11-04 NOTE — Progress Notes (Signed)
Pt presents today for b12 injection. 

## 2020-11-11 ENCOUNTER — Ambulatory Visit (INDEPENDENT_AMBULATORY_CARE_PROVIDER_SITE_OTHER): Payer: BC Managed Care – PPO

## 2020-11-11 ENCOUNTER — Other Ambulatory Visit: Payer: Self-pay

## 2020-11-11 VITALS — BP 132/70 | HR 66 | Temp 98.3°F | Ht 63.2 in | Wt 207.8 lb

## 2020-11-11 DIAGNOSIS — E538 Deficiency of other specified B group vitamins: Secondary | ICD-10-CM

## 2020-11-11 MED ORDER — CYANOCOBALAMIN 1000 MCG/ML IJ SOLN
1000.0000 ug | Freq: Once | INTRAMUSCULAR | Status: AC
  Administered 2020-11-11: 1000 ug via INTRAMUSCULAR

## 2020-11-11 NOTE — Progress Notes (Signed)
Patient presents today for her 3rd Vitamin B12 injection. YL,RMA

## 2020-11-18 ENCOUNTER — Other Ambulatory Visit: Payer: Self-pay

## 2020-11-18 ENCOUNTER — Ambulatory Visit (INDEPENDENT_AMBULATORY_CARE_PROVIDER_SITE_OTHER): Payer: BC Managed Care – PPO

## 2020-11-18 VITALS — BP 130/78 | HR 73 | Temp 99.0°F | Ht 63.2 in | Wt 206.8 lb

## 2020-11-18 DIAGNOSIS — E538 Deficiency of other specified B group vitamins: Secondary | ICD-10-CM | POA: Diagnosis not present

## 2020-11-18 MED ORDER — CYANOCOBALAMIN 1000 MCG/ML IJ SOLN
1000.0000 ug | Freq: Once | INTRAMUSCULAR | Status: AC
  Administered 2020-11-18: 1000 ug via INTRAMUSCULAR

## 2020-11-18 NOTE — Progress Notes (Signed)
Patient presents today for her 4th vitamin B12 injection. She will f/u with Dr.Sanders for a recheck on her levels. YL,RMA

## 2020-12-07 ENCOUNTER — Other Ambulatory Visit: Payer: Self-pay | Admitting: Internal Medicine

## 2020-12-11 ENCOUNTER — Other Ambulatory Visit: Payer: Self-pay | Admitting: Internal Medicine

## 2020-12-18 ENCOUNTER — Ambulatory Visit (INDEPENDENT_AMBULATORY_CARE_PROVIDER_SITE_OTHER): Payer: BC Managed Care – PPO | Admitting: Internal Medicine

## 2020-12-18 ENCOUNTER — Other Ambulatory Visit: Payer: Self-pay

## 2020-12-18 ENCOUNTER — Encounter: Payer: Self-pay | Admitting: Internal Medicine

## 2020-12-18 VITALS — BP 126/64 | HR 77 | Temp 98.4°F | Ht 63.8 in | Wt 207.6 lb

## 2020-12-18 DIAGNOSIS — E538 Deficiency of other specified B group vitamins: Secondary | ICD-10-CM | POA: Diagnosis not present

## 2020-12-18 DIAGNOSIS — M791 Myalgia, unspecified site: Secondary | ICD-10-CM | POA: Diagnosis not present

## 2020-12-18 DIAGNOSIS — Z2821 Immunization not carried out because of patient refusal: Secondary | ICD-10-CM

## 2020-12-18 DIAGNOSIS — I2510 Atherosclerotic heart disease of native coronary artery without angina pectoris: Secondary | ICD-10-CM | POA: Diagnosis not present

## 2020-12-18 DIAGNOSIS — R778 Other specified abnormalities of plasma proteins: Secondary | ICD-10-CM

## 2020-12-18 DIAGNOSIS — R350 Frequency of micturition: Secondary | ICD-10-CM | POA: Diagnosis not present

## 2020-12-18 LAB — POCT URINALYSIS DIPSTICK
Bilirubin, UA: NEGATIVE
Glucose, UA: NEGATIVE
Ketones, UA: NEGATIVE
Leukocytes, UA: NEGATIVE
Nitrite, UA: NEGATIVE
Protein, UA: NEGATIVE
Spec Grav, UA: 1.025 (ref 1.010–1.025)
Urobilinogen, UA: 0.2 E.U./dL
pH, UA: 6.5 (ref 5.0–8.0)

## 2020-12-18 MED ORDER — CYANOCOBALAMIN 1000 MCG/ML IJ SOLN
1000.0000 ug | Freq: Once | INTRAMUSCULAR | Status: AC
  Administered 2020-12-18: 1000 ug via INTRAMUSCULAR

## 2020-12-18 NOTE — Progress Notes (Signed)
I,[name],acting as a Neurosurgeon for [name], MD.,have documented all relevant documentation on the behalf of [name], MD,as directed by  [name], MD while in the presence of [name], MD.  This visit occurred during the SARS-CoV-2 public health emergency.  Safety protocols were in place, including screening questions prior to the visit, additional usage of staff PPE, and extensive cleaning of exam room while observing appropriate contact time as indicated for disinfecting solutions.  Subjective:     Patient ID: Dawn Wood , female    DOB: 1946/01/08 , 75 y.o.   MRN: [id number]   Chief Complaint  Patient presents with   B12 Injection    HPI  Patient is here for b12 follow up. She has received FOUR b12 injections so far. She has not had any issues with the injections. She has not noticed any improvement in her energy levels.     Past Medical History:  Diagnosis Date   Coronary artery disease    Diabetes mellitus without complication (HCC)    GERD (gastroesophageal reflux disease)    Hypertension    MI (myocardial infarction) (HCC)    Peripheral vascular disease (HCC)      Family History  Problem Relation Age of Onset   Aortic stenosis Daughter    Heart disease Daughter        before age 49   Hypertension Mother    Cancer Father    Hypertension Father    Thyroid disease Daughter    Breast cancer Neg Hx      Current Outpatient Medications:    Alirocumab (PRALUENT) 75 MG/ML SOAJ, Inject 75 mg into the skin every 14 (fourteen) days., Disp: 2 mL, Rfl: 11   amLODipine (NORVASC) 5 MG tablet, Take 1 tablet (5 mg total) by mouth daily., Disp: 90 tablet, Rfl: 3   BD PEN NEEDLE NANO 2ND GEN 32G X 4 MM MISC, USE AS DIRECTED TWICE DAILY, Disp: 100 each, Rfl: 2   Cholecalciferol (VITAMIN D3) 2000 units TABS, Take 1 tablet by mouth daily., Disp: , Rfl:    clopidogrel (PLAVIX) 75 MG tablet, Take 1 tablet (75 mg total) by mouth daily., Disp:  90 tablet, Rfl: 3   Coenzyme Q10 (CO Q 10) 10 MG CAPS, Take by mouth daily., Disp: , Rfl:    colchicine 0.6 MG tablet, Take 1 tablet by mouth daily. If you experience a flare, increase to 2 tablets by mouth daily., Disp: 60 tablet, Rfl: 2   dexlansoprazole (DEXILANT) 60 MG capsule, Take 1 capsule (60 mg total) by mouth daily., Disp: 30 capsule, Rfl: 1   fluticasone (FLONASE) 50 MCG/ACT nasal spray, Place 1 spray into both nostrils daily as needed for allergies or rhinitis., Disp: 16 g, Rfl: 2   lidocaine-prilocaine (EMLA) cream, , Disp: , Rfl:    metoprolol succinate (TOPROL-XL) 100 MG 24 hr tablet, TAKE 1 TABLET BY MOUTH IMMEDIATELY FOLLOWING A MEAL, Disp: 90 tablet, Rfl: 1   nitroGLYCERIN (NITROSTAT) 0.4 MG SL tablet, Place 1 tablet (0.4 mg total) under the tongue every 5 (five) minutes as needed for chest pain., Disp: 25 tablet, Rfl: 3   NOVOLOG MIX 70/30 FLEXPEN (70-30) 100 UNIT/ML FlexPen, INJECT SUBCUTANEOUS 40 UNITS IN THE MORNING AND 45 UNITS AT BEDTIME, Disp: 75 mL, Rfl: 2   NOVOLOG MIX 70/30 FLEXPEN (70-30) 100 UNIT/ML FlexPen, INJECT SUBCUTANEOUS 40 UNITS IN THE MORNING AND 45 UNITS AT BEDTIME (Patient not taking: Reported on 01/12/2021), Disp: 75 mL, Rfl:  2   olmesartan (BENICAR) 40 MG tablet, TAKE 1 TABLET(40 MG) BY MOUTH DAILY, Disp: 90 tablet, Rfl: 2   ONETOUCH VERIO test strip, Use as instructed to check blood sugars 2 times per day dx: e11.65, Disp: 100 strip, Rfl: 3   Semaglutide,0.25 or 0.5MG /DOS, (OZEMPIC, 0.25 OR 0.5 MG/DOSE,) 2 MG/1.5ML SOPN, INJECT 0.5MG  INTO SKIN ONCE A WEEK, Disp: 4.5 mL, Rfl: 1   spironolactone-hydrochlorothiazide (ALDACTAZIDE) 25-25 MG tablet, Take 1 tablet by mouth daily., Disp: 90 tablet, Rfl: 3   Allergies  Allergen Reactions   Dust Mite Extract Other (See Comments)    REACTION: SNEEZING   Other Other (See Comments)    ALLERGEN: DEODORIZERS SPRAY AND PERFUMES REACTION: SNEEZING   Statins Other (See Comments)    Muscle aches with rosuvastatin 5 mg  daily and 3x/weekly, atorvastatin 20 mg daily and 3x/week, simvastatin 40 mg daily    Allopurinol     Diarrhea and the pt said it made her sugars low   Pollen Extract Other (See Comments)    Reaction unknown     Review of Systems  Constitutional: Negative.   Respiratory: Negative.    Cardiovascular: Negative.   Gastrointestinal: Negative.   Neurological: Negative.     Today's Vitals   12/18/20 1544  BP: 126/64  Pulse: 77  Temp: 98.4 F (36.9 C)  TempSrc: Oral  Weight: 207 lb 9.6 oz (94.2 kg)  Height: 5' 3.8" (1.621 m)   Body mass index is 35.86 kg/m.  Wt Readings from Last 3 Encounters:  01/12/21 207 lb (93.9 kg)  12/18/20 207 lb 9.6 oz (94.2 kg)  11/18/20 206 lb 12.8 oz (93.8 kg)    Objective:  Physical Exam Vitals and nursing note reviewed.  Constitutional:      Appearance: Normal appearance.  HENT:     Head: Normocephalic and atraumatic.     Nose:     Comments: Masked     Mouth/Throat:     Comments: Masked  Eyes:     Extraocular Movements: Extraocular movements intact.  Cardiovascular:     Rate and Rhythm: Normal rate and regular rhythm.     Heart sounds: Normal heart sounds.  Pulmonary:     Effort: Pulmonary effort is normal.     Breath sounds: Normal breath sounds.  Musculoskeletal:     Cervical back: Normal range of motion.  Skin:    General: Skin is warm.  Neurological:     General: No focal deficit present.     Mental Status: She is alert.  Psychiatric:        Mood and Affect: Mood normal.        Behavior: Behavior normal.        Assessment And Plan:     1. Vitamin B12 deficiency Comments: I will check vitamin B12 levels to check response to supplementation. She was also given vit B12 IM x 1. She will rto in 4 weeks for next injection.  - Vitamin B12 - cyanocobalamin ((VITAMIN B-12)) injection 1,000 mcg  2. Urine frequency Comments: I will check urinalysis today and treat as indicated.  - POCT Urinalysis Dipstick (81002)  3.  Myalgia Comments: She is encouraged to stay well hydrated. May benefit from Mg supplementation.  - CK, total  4. Abnormal SPEP Comments: I will repeat SPEP today. Last reading neg for M-spike.  - Protein electrophoresis, serum  5. 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 Again,  encouraged to incorporate more exercise into her daily routine to decrease cardiac risk.   6. Influenza vaccination declined  Patient was given opportunity to ask questions. Patient verbalized understanding of the plan and was able to repeat key elements of the plan. All questions were answered to their satisfaction.   I, [name], MD, have reviewed all documentation for this visit. The documentation on 01/17/21 for the exam, diagnosis, procedures, and orders are all accurate and complete.   IF YOU HAVE BEEN REFERRED TO A SPECIALIST, IT MAY TAKE 1-2 WEEKS TO SCHEDULE/PROCESS THE REFERRAL. IF YOU HAVE NOT HEARD FROM US/SPECIALIST IN TWO WEEKS, PLEASE GIVE [place] A CALL AT [phone] X [phone].   THE PATIENT IS ENCOURAGED TO PRACTICE SOCIAL DISTANCING DUE TO THE COVID-19 PANDEMIC.

## 2020-12-18 NOTE — Patient Instructions (Signed)
Vitamin B12 Deficiency Vitamin B12 deficiency means that your body does not have enough vitamin B12. The body needs this vitamin: To make red blood cells. To make genes (DNA). To help the nerves work. If you do not have enough vitamin B12 in your body, you can have health problems. What are the causes? Not eating enough foods that contain vitamin B12. Not being able to absorb vitamin B12 from the food that you eat. Certain digestive system diseases. A condition in which the body does not make enough of a certain protein, which results in too few red blood cells (pernicious anemia). Having a surgery in which part of the stomach or small intestine is removed. Taking medicines that make it hard for the body to absorb vitamin B12. These medicines include: Heartburn medicines. Some antibiotic medicines. Other medicines that are used to treat certain conditions. What increases the risk? Being older than age 50. Eating a vegetarian or vegan diet, especially while you are pregnant. Eating a poor diet while you are pregnant. Taking certain medicines. Having alcoholism. What are the signs or symptoms? In some cases, there are no symptoms. If the condition leads to too few blood cells or nerve damage, symptoms can occur, such as: Feeling weak. Feeling tired (fatigued). Not being hungry. Weight loss. A loss of feeling (numbness) or tingling in your hands and feet. Redness and burning of the tongue. Being mixed up (confused) or having memory problems. Sadness (depression). Problems with your senses. This can include color blindness, ringing in the ears, or loss of taste. Watery poop (diarrhea) or trouble pooping (constipation). Trouble walking. If anemia is very bad, symptoms can include: Being short of breath. Being dizzy. Having a very fast heartbeat. How is this treated? Changing the way you eat and drink, such as: Eating more foods that contain vitamin B12. Drinking little or no  alcohol. Getting vitamin B12 shots. Taking vitamin B12 supplements. Your doctor will tell you the dose that is best for you. Follow these instructions at home: Eating and drinking  Eat lots of healthy foods that contain vitamin B12. These include: Meats and poultry, such as beef, pork, chicken, turkey, and organ meats, such as liver. Seafood, such as clams, rainbow trout, salmon, tuna, and haddock. Eggs. Cereal and dairy products that have vitamin B12 added to them. Check the label. The items listed above may not be a complete list of what you can eat and drink. Contact a dietitian for more options. General instructions Get any shots as told by your doctor. Take supplements only as told by your doctor. Do not drink alcohol if your doctor tells you not to. In some cases, you may only be asked to limit alcohol use. Keep all follow-up visits as told by your doctor. This is important. Contact a doctor if: Your symptoms come back. Get help right away if: You have trouble breathing. You have a very fast heartbeat. You have chest pain. You get dizzy. You pass out. Summary Vitamin B12 deficiency means that your body is not getting enough vitamin B12. In some cases, there are no symptoms of this condition. Treatment may include making a change in the way you eat and drink, getting vitamin B12 shots, or taking supplements. Eat lots of healthy foods that contain vitamin B12. This information is not intended to replace advice given to you by your health care provider. Make sure you discuss any questions you have with your health care provider. Document Revised: 11/01/2017 Document Reviewed: 11/01/2017 Elsevier Patient Education    2022 [employer].  

## 2020-12-22 LAB — PROTEIN ELECTROPHORESIS, SERUM
A/G Ratio: 1.2 (ref 0.7–1.7)
Albumin ELP: 4.1 g/dL (ref 2.9–4.4)
Alpha 1: 0.2 g/dL (ref 0.0–0.4)
Alpha 2: 0.8 g/dL (ref 0.4–1.0)
Beta: 1.6 g/dL — ABNORMAL HIGH (ref 0.7–1.3)
Gamma Globulin: 0.9 g/dL (ref 0.4–1.8)
Globulin, Total: 3.5 g/dL (ref 2.2–3.9)
Total Protein: 7.6 g/dL (ref 6.0–8.5)

## 2020-12-22 LAB — CK: Total CK: 201 U/L — ABNORMAL HIGH (ref 32–182)

## 2020-12-22 LAB — VITAMIN B12: Vitamin B-12: 519 pg/mL (ref 232–1245)

## 2021-01-02 ENCOUNTER — Other Ambulatory Visit: Payer: Self-pay | Admitting: *Deleted

## 2021-01-02 MED ORDER — AMLODIPINE BESYLATE 5 MG PO TABS: 5.0000 mg | ORAL_TABLET | Freq: Every day | ORAL | 3 refills | Status: DC

## 2021-01-11 NOTE — Progress Notes (Signed)
Cardiology Office Note:    Date:  01/12/2021   ID:  [employer], DOB 02/24/1946, MRN [id number]  PCP:  Dorothyann Peng, MD  Cardiologist:  Lesleigh Noe, MD   Referring MD: Dorothyann Peng, MD   Chief Complaint  Patient presents with   Coronary Artery Disease   Hypertension     History of Present Illness:    Dawn Wood is a 75 y.o. female with a hx of CAD With prior LAD DE stent (2015 Dr. Park Breed), hypertension, DM II, PAD, osteoarthritis and gouty arthritis, and hyperlipidemia.    She is doing okay.  She states that she is having a lot of pain in her legs and hips when she walks.  She denies orthopnea PND.  There is some dyspnea on exertion.  She is getting ready to retire from [employer].  She has not needed to use nitroglycerin.  She denies angina.  No side effects to current medical regimen other than her inability to take Tylenol and nonsteroidals because of potential interactions.  Past Medical History:  Diagnosis Date   Coronary artery disease    Diabetes mellitus without complication (HCC)    GERD (gastroesophageal reflux disease)    Hypertension    MI (myocardial infarction) (HCC)    Peripheral vascular disease (HCC)     Past Surgical History:  Procedure Laterality Date   ABDOMINAL HYSTERECTOMY  1985   APPENDECTOMY     CATARACT EXTRACTION Left 02/20/2018   Dr. Harlon Flor   CORONARY STENT PLACEMENT  2015   ectopic pregnancy--unilateral salpingectomy  1980   KNEE ARTHROSCOPY Left 03/2016   unilateral oophorectomy      Current Medications: Current Meds  Medication Sig   Alirocumab (PRALUENT) 75 MG/ML SOAJ Inject 75 mg into the skin every 14 (fourteen) days.   amLODipine (NORVASC) 5 MG tablet Take 1 tablet (5 mg total) by mouth daily.   BD PEN NEEDLE NANO 2ND GEN 32G X 4 MM MISC USE AS DIRECTED TWICE DAILY   Cholecalciferol (VITAMIN D3) 2000 units TABS Take 1 tablet by mouth daily.   clopidogrel (PLAVIX) 75 MG tablet Take 1 tablet  (75 mg total) by mouth daily.   Coenzyme Q10 (CO Q 10) 10 MG CAPS Take by mouth daily.   colchicine 0.6 MG tablet Take 1 tablet by mouth daily. If you experience a flare, increase to 2 tablets by mouth daily.   dexlansoprazole (DEXILANT) 60 MG capsule Take 1 capsule (60 mg total) by mouth daily.   fluticasone (FLONASE) 50 MCG/ACT nasal spray Place 1 spray into both nostrils daily as needed for allergies or rhinitis.   lidocaine-prilocaine (EMLA) cream    metoprolol succinate (TOPROL-XL) 100 MG 24 hr tablet TAKE 1 TABLET BY MOUTH IMMEDIATELY FOLLOWING A MEAL   nitroGLYCERIN (NITROSTAT) 0.4 MG SL tablet Place 1 tablet (0.4 mg total) under the tongue every 5 (five) minutes as needed for chest pain.   NOVOLOG MIX 70/30 FLEXPEN (70-30) 100 UNIT/ML FlexPen INJECT SUBCUTANEOUS 40 UNITS IN THE MORNING AND 45 UNITS AT BEDTIME   olmesartan (BENICAR) 40 MG tablet TAKE 1 TABLET(40 MG) BY MOUTH DAILY   ONETOUCH VERIO test strip Use as instructed to check blood sugars 2 times per day dx: e11.65   Semaglutide,0.25 or 0.5MG /DOS, (OZEMPIC, 0.25 OR 0.5 MG/DOSE,) 2 MG/1.5ML SOPN INJECT 0.5MG  INTO SKIN ONCE A WEEK   spironolactone-hydrochlorothiazide (ALDACTAZIDE) 25-25 MG tablet Take 1 tablet by mouth daily.   [DISCONTINUED] aspirin 81 MG tablet Take 81 mg  by mouth daily.     Allergies:   Dust mite extract, Other, Statins, Allopurinol, and Pollen extract   Social History   Socioeconomic History   Marital status: Single    Spouse name: Not on file   Number of children: Not on file   Years of education: Not on file   Highest education level: Not on file  Occupational History   Not on file  Tobacco Use   Smoking status: Former    Packs/day: 0.25    Years: 45.00    Pack years: 11.25    Types: Cigarettes    Quit date: 01/10/2013    Years since quitting: 8.0   Smokeless tobacco: Never  Vaping Use   Vaping Use: Never used  Substance and Sexual Activity   Alcohol use: Not Currently    Alcohol/week: 0.0  standard drinks   Drug use: No   Sexual activity: Not on file  Other Topics Concern   Not on file  Social History Narrative   Not on file   Social Determinants of Health   Financial Resource Strain: Not on file  Food Insecurity: Not on file  Transportation Needs: Not on file  Physical Activity: Not on file  Stress: Not on file  Social Connections: Not on file     Family History: The patient's family history includes Aortic stenosis in her daughter; Cancer in her father; Heart disease in her daughter; Hypertension in her father and mother; Thyroid disease in her daughter. There is no history of Breast cancer.  ROS:   Please see the history of present illness.    Taking both aspirin and Plavix.  We decided to discontinue aspirin and continue Plavix monotherapy.  We discussed getting COVID-19 booster.  She had chest pain after her only booster shot.  Chest pain occurred immediately upon receiving shot.  She waited for an hour at her doctor's office and was then discharged home.  She has had no chest pain since.  All other systems reviewed and are negative.  EKGs/Labs/Other Studies Reviewed:    The following studies were reviewed today: No new or recent imaging  EKG:  EKG not repeated  Recent Labs: 02/11/2020: Magnesium 1.8 08/18/2020: ALT 11 10/14/2020: BUN 18; Creatinine, Ser 0.93; Hemoglobin 13.2; Platelets 265; Potassium 3.8; Sodium 139; TSH 1.180  Recent Lipid Panel    Component Value Date/Time   CHOL 109 08/18/2020 0954   CHOL 167 05/28/2013 0413   TRIG 108 08/18/2020 0954   TRIG 180 05/28/2013 0413   HDL 45 08/18/2020 0954   HDL 37 (L) 05/28/2013 0413   CHOLHDL 2.4 08/18/2020 0954   CHOLHDL 3 12/02/2014 0856   VLDL 22.0 12/02/2014 0856   VLDL 36 05/28/2013 0413   LDLCALC 44 08/18/2020 0954   LDLCALC 94 05/28/2013 0413   LDLDIRECT 83 06/11/2019 1425    Physical Exam:    VS:  BP 138/74   Pulse 65   Ht 5' 3.8" (1.621 m)   Wt 207 lb (93.9 kg)   SpO2 95%   BMI  35.75 kg/m     Wt Readings from Last 3 Encounters:  01/12/21 207 lb (93.9 kg)  12/18/20 207 lb 9.6 oz (94.2 kg)  11/18/20 206 lb 12.8 oz (93.8 kg)     GEN: Overweight. No acute distress HEENT: Normal NECK: No JVD. LYMPHATICS: No lymphadenopathy CARDIAC: Scratchy 1/6 systolic right upper sternal murmur. RRR no gallop, or edema. VASCULAR:  Normal Pulses. No bruits. RESPIRATORY:  Clear to auscultation without rales,  wheezing or rhonchi  ABDOMEN: Soft, non-tender, non-distended, No pulsatile mass, MUSCULOSKELETAL: No deformity  SKIN: Warm and dry NEUROLOGIC:  Alert and oriented x 3 PSYCHIATRIC:  Normal affect   ASSESSMENT:    1. CAD in native artery   2. Essential hypertension   3. PVD (peripheral vascular disease) (HCC)   4. Dyslipidemia   5. Uncontrolled type 2 diabetes mellitus with hyperglycemia (HCC)    PLAN:    In order of problems listed above:  Secondary prevention reviewed.  She needs to improve physical activity.  Continue Plavix 75 mg/day.  Discontinue aspirin. Blood pressure is under good control.  Continue current therapy including Aldactazide 25/25 mg daily, Benicar 40 mg daily, Toprol-XL 100 mg/day. PAD is followed by [facility] [place].  She has an upcoming study Continue Praluent most recent LDL was 44. Hemoglobin A1c 7.7.  Consider SGLT2 therapy.  Overall education and awareness concerning primary/secondary risk prevention was discussed in detail: LDL less than 70, hemoglobin A1c less than 7, blood pressure target less than 130/80 mmHg, >150 minutes of moderate aerobic activity per week, avoidance of smoking, weight control (via diet and exercise), and continued surveillance/management of/for obstructive sleep apnea.    Medication Adjustments/Labs and Tests Ordered: Current medicines are reviewed at length with the patient today.  Concerns regarding medicines are outlined above.  No orders of the defined types were placed in this  encounter.  No orders of the defined types were placed in this encounter.   Patient Instructions  Medication Instructions:  1) DISCONTINUE Aspirin  *If you need a refill on your cardiac medications before your next appointment, please call your pharmacy*   Lab Work: None If you have labs (blood work) drawn today and your tests are completely normal, you will receive your results only by: MyChart Message (if you have MyChart) OR A paper copy in the mail If you have any lab test that is abnormal or we need to change your treatment, we will call you to review the results.   Testing/Procedures: None   Follow-Up: At [facility], you and your health needs are our priority.  As part of our continuing mission to provide you with exceptional heart care, we have created designated Provider Care Teams.  These Care Teams include your primary Cardiologist (physician) and Advanced Practice Providers (APPs -  Physician Assistants and Nurse Practitioners) who all work together to provide you with the care you need, when you need it.  We recommend signing up for the patient portal called "MyChart".  Sign up information is provided on this After Visit Summary.  MyChart is used to connect with patients for Virtual Visits (Telemedicine).  Patients are able to view lab/test results, encounter notes, upcoming appointments, etc.  Non-urgent messages can be sent to your provider as well.   To learn more about what you can do with MyChart, go to [email or web address].    Your next appointment:   9-12 month(s)  The format for your next appointment:   In Person  Provider:   Lesleigh Noe, MD     Other Instructions     Signed, Lesleigh Noe, MD  01/12/2021 3:49 PM     [facility]

## 2021-01-12 ENCOUNTER — Ambulatory Visit: Payer: BC Managed Care – PPO | Admitting: Interventional Cardiology

## 2021-01-12 ENCOUNTER — Encounter: Payer: Self-pay | Admitting: Interventional Cardiology

## 2021-01-12 ENCOUNTER — Other Ambulatory Visit: Payer: Self-pay

## 2021-01-12 VITALS — BP 138/74 | HR 65 | Ht 63.8 in | Wt 207.0 lb

## 2021-01-12 DIAGNOSIS — I739 Peripheral vascular disease, unspecified: Secondary | ICD-10-CM

## 2021-01-12 DIAGNOSIS — I10 Essential (primary) hypertension: Secondary | ICD-10-CM | POA: Diagnosis not present

## 2021-01-12 DIAGNOSIS — E1165 Type 2 diabetes mellitus with hyperglycemia: Secondary | ICD-10-CM

## 2021-01-12 DIAGNOSIS — I2510 Atherosclerotic heart disease of native coronary artery without angina pectoris: Secondary | ICD-10-CM | POA: Diagnosis not present

## 2021-01-12 DIAGNOSIS — E785 Hyperlipidemia, unspecified: Secondary | ICD-10-CM | POA: Diagnosis not present

## 2021-01-12 NOTE — Patient Instructions (Signed)
Medication Instructions:  1) DISCONTINUE Aspirin  *If you need a refill on your cardiac medications before your next appointment, please call your pharmacy*   Lab Work: None If you have labs (blood work) drawn today and your tests are completely normal, you will receive your results only by: MyChart Message (if you have MyChart) OR A paper copy in the mail If you have any lab test that is abnormal or we need to change your treatment, we will call you to review the results.   Testing/Procedures: None   Follow-Up: At [facility], you and your health needs are our priority.  As part of our continuing mission to provide you with exceptional heart care, we have created designated Provider Care Teams.  These Care Teams include your primary Cardiologist (physician) and Advanced Practice Providers (APPs -  Physician Assistants and Nurse Practitioners) who all work together to provide you with the care you need, when you need it.  We recommend signing up for the patient portal called "MyChart".  Sign up information is provided on this After Visit Summary.  MyChart is used to connect with patients for Virtual Visits (Telemedicine).  Patients are able to view lab/test results, encounter notes, upcoming appointments, etc.  Non-urgent messages can be sent to your provider as well.   To learn more about what you can do with MyChart, go to [email or web address].    Your next appointment:   9-12 month(s)  The format for your next appointment:   In Person  Provider:   Lesleigh Noe, MD     Other Instructions

## 2021-01-26 ENCOUNTER — Other Ambulatory Visit: Payer: Self-pay

## 2021-01-26 DIAGNOSIS — E1122 Type 2 diabetes mellitus with diabetic chronic kidney disease: Secondary | ICD-10-CM

## 2021-01-26 DIAGNOSIS — N182 Chronic kidney disease, stage 2 (mild): Secondary | ICD-10-CM

## 2021-01-26 MED ORDER — OZEMPIC (0.25 OR 0.5 MG/DOSE) 2 MG/1.5ML ~~LOC~~ SOPN: PEN_INJECTOR | SUBCUTANEOUS | 1 refills | Status: DC

## 2021-02-16 ENCOUNTER — Ambulatory Visit (INDEPENDENT_AMBULATORY_CARE_PROVIDER_SITE_OTHER): Payer: BC Managed Care – PPO | Admitting: Internal Medicine

## 2021-02-16 ENCOUNTER — Other Ambulatory Visit: Payer: Self-pay

## 2021-02-16 ENCOUNTER — Encounter: Payer: Self-pay | Admitting: Internal Medicine

## 2021-02-16 DIAGNOSIS — I1310 Hypertensive heart and chronic kidney disease without heart failure, with stage 1 through stage 4 chronic kidney disease, or unspecified chronic kidney disease: Secondary | ICD-10-CM | POA: Diagnosis not present

## 2021-02-16 DIAGNOSIS — Z794 Long term (current) use of insulin: Secondary | ICD-10-CM | POA: Diagnosis not present

## 2021-02-16 DIAGNOSIS — N1831 Chronic kidney disease, stage 3a: Secondary | ICD-10-CM

## 2021-02-16 DIAGNOSIS — E1122 Type 2 diabetes mellitus with diabetic chronic kidney disease: Secondary | ICD-10-CM

## 2021-02-16 DIAGNOSIS — R0982 Postnasal drip: Secondary | ICD-10-CM

## 2021-02-16 DIAGNOSIS — G4733 Obstructive sleep apnea (adult) (pediatric): Secondary | ICD-10-CM

## 2021-02-16 LAB — POCT UA - MICROALBUMIN
Albumin/Creatinine Ratio, Urine, POC: <30
Creatinine, POC: 100 mg/dL
Microalbumin Ur, POC: 10 mg/L

## 2021-02-16 MED ORDER — OLMESARTAN MEDOXOMIL 40 MG PO TABS: ORAL_TABLET | ORAL | 2 refills | Status: DC

## 2021-02-16 NOTE — Patient Instructions (Signed)

## 2021-02-16 NOTE — Progress Notes (Signed)
I,Katawbba Wiggins,acting as a Education administrator for [name], MD.,have documented all relevant documentation on the behalf of [name], MD,as directed by  [name], MD while in the presence of [name], MD.  This visit occurred during the SARS-CoV-2 public health emergency.  Safety protocols were in place, including screening questions prior to the visit, additional usage of staff PPE, and extensive cleaning of exam room while observing appropriate contact time as indicated for disinfecting solutions.  Subjective:     Patient ID: Dawn Wood , female    DOB: 04/27/1945 , 75 y.o.   MRN: [id number]   Chief Complaint  Patient presents with   Diabetes   Hypertension    HPI  Patient is here today for diabetes/BP check. Reports compliance with meds. States she has had elevated BS recently. Diagnosed with the flu Friday after Thanksgiving. States she had to take a course of prednisone. This is likely what caused her blood sugars to go up.   Diabetes She presents for her follow-up diabetic visit. She has type 2 diabetes mellitus. Her disease course has been stable. There are no hypoglycemic associated symptoms. Pertinent negatives for diabetes include no blurred vision. There are no hypoglycemic complications. Diabetic complications include nephropathy. Risk factors for coronary artery disease include diabetes mellitus, dyslipidemia, hypertension, obesity, sedentary lifestyle and post-menopausal. Current diabetic treatment includes insulin injections. She is following a diabetic diet. She participates in exercise intermittently. Her home blood glucose trend is fluctuating minimally. Her breakfast blood glucose is taken between 8-9 am. Her breakfast blood glucose range is generally 110-130 mg/dl. Eye exam is current.  Hypertension This is a chronic problem. The current episode started more than 1 year ago. The problem has been gradually improving since onset. Pertinent  negatives include no blurred vision. Risk factors for coronary artery disease include diabetes mellitus, dyslipidemia, sedentary lifestyle and post-menopausal state. Hypertensive end-organ damage includes kidney disease.    Past Medical History:  Diagnosis Date   Coronary artery disease    Diabetes mellitus without complication (HCC)    GERD (gastroesophageal reflux disease)    Hypertension    MI (myocardial infarction) ([place])    Peripheral vascular disease ([place])      Family History  Problem Relation Age of Onset   Aortic stenosis Daughter    Heart disease Daughter        before age 20   Hypertension Mother    Cancer Father    Hypertension Father    Thyroid disease Daughter    Breast cancer Neg Hx      Current Outpatient Medications:    Alirocumab (PRALUENT) 75 MG/ML SOAJ, Inject 75 mg into the skin every 14 (fourteen) days., Disp: 2 mL, Rfl: 11   amLODipine (NORVASC) 5 MG tablet, Take 1 tablet (5 mg total) by mouth daily., Disp: 90 tablet, Rfl: 3   BD PEN NEEDLE NANO 2ND GEN 32G X 4 MM MISC, USE AS DIRECTED TWICE DAILY, Disp: 100 each, Rfl: 2   Cholecalciferol (VITAMIN D3) 2000 units TABS, Take 1 tablet by mouth daily., Disp: , Rfl:    clopidogrel (PLAVIX) 75 MG tablet, Take 1 tablet (75 mg total) by mouth daily., Disp: 90 tablet, Rfl: 3   Coenzyme Q10 (CO Q 10) 10 MG CAPS, Take by mouth daily., Disp: , Rfl:    colchicine 0.6 MG tablet, Take 1 tablet by mouth daily. If you experience a flare, increase to 2 tablets by mouth daily., Disp: 60 tablet, Rfl: 2  fluticasone (FLONASE) 50 MCG/ACT nasal spray, Place 1 spray into both nostrils daily as needed for allergies or rhinitis., Disp: 16 g, Rfl: 2   metoprolol succinate (TOPROL-XL) 100 MG 24 hr tablet, TAKE 1 TABLET BY MOUTH IMMEDIATELY FOLLOWING A MEAL, Disp: 90 tablet, Rfl: 1   nitroGLYCERIN (NITROSTAT) 0.4 MG SL tablet, Place 1 tablet (0.4 mg total) under the tongue every 5 (five) minutes as needed for chest pain., Disp: 25  tablet, Rfl: 3   NOVOLOG MIX 70/30 FLEXPEN (70-30) 100 UNIT/ML FlexPen, INJECT SUBCUTANEOUS 40 UNITS IN THE MORNING AND 45 UNITS AT BEDTIME, Disp: 75 mL, Rfl: 2   ONETOUCH VERIO test strip, Use as instructed to check blood sugars 2 times per day dx: e11.65, Disp: 100 strip, Rfl: 3   Semaglutide,0.25 or 0.5MG/DOS, (OZEMPIC, 0.25 OR 0.5 MG/DOSE,) 2 MG/1.5ML SOPN, INJECT 0.5MG INTO SKIN ONCE A WEEK, Disp: 4.5 mL, Rfl: 1   dexlansoprazole (DEXILANT) 60 MG capsule, Take 1 capsule (60 mg total) by mouth daily. (Patient not taking: Reported on 02/16/2021), Disp: 30 capsule, Rfl: 1   lidocaine-prilocaine (EMLA) cream, , Disp: , Rfl:    olmesartan (BENICAR) 40 MG tablet, TAKE 1 TABLET(40 MG) BY MOUTH DAILY, Disp: 90 tablet, Rfl: 2   spironolactone-hydrochlorothiazide (ALDACTAZIDE) 25-25 MG tablet, Take 1 tablet by mouth daily., Disp: 90 tablet, Rfl: 3   Allergies  Allergen Reactions   Dust Mite Extract Other (See Comments)    REACTION: SNEEZING   Other Other (See Comments)    ALLERGEN: DEODORIZERS SPRAY AND PERFUMES REACTION: SNEEZING   Statins Other (See Comments)    Muscle aches with rosuvastatin 5 mg daily and 3x/weekly, atorvastatin 20 mg daily and 3x/week, simvastatin 40 mg daily    Allopurinol     Diarrhea and the pt said it made her sugars low   Pollen Extract Other (See Comments)    Reaction unknown     Review of Systems  Constitutional: Negative.   Eyes:  Negative for blurred vision.  Respiratory:  Positive for cough.        Unfortunately, she was diagnosed with the flu the Friday after Thanksgiving. States she had to go back twice due to persistence of sx. Most recently treated with prednisone and Mucinex. She still has a cough. No fever/chills at this time. Went back to work today, "it was rough".  Cardiovascular: Negative.   Gastrointestinal: Negative.   Neurological: Negative.   Psychiatric/Behavioral: Negative.      Today's Vitals   02/16/21 1443  BP: 134/72  Pulse: 74   Temp: 98.9 F (37.2 C)  SpO2: 97%  Weight: 207 lb (93.9 kg)  Height: 5' 3.8" (1.621 m)   Body mass index is 35.75 kg/m.  Wt Readings from Last 3 Encounters:  02/16/21 207 lb (93.9 kg)  01/12/21 207 lb (93.9 kg)  12/18/20 207 lb 9.6 oz (94.2 kg)    BP Readings from Last 3 Encounters:  02/16/21 134/72  01/12/21 138/74  12/18/20 126/64    Objective:  Physical Exam Vitals and nursing note reviewed.  Constitutional:      Appearance: Normal appearance. She is obese.  HENT:     Head: Normocephalic and atraumatic.     Nose:     Comments: Masked     Mouth/Throat:     Comments: Masked  Eyes:     Extraocular Movements: Extraocular movements intact.  Cardiovascular:     Rate and Rhythm: Normal rate and regular rhythm.     Heart sounds: Normal heart sounds.  Pulmonary:     Effort: Pulmonary effort is normal.     Breath sounds: Normal breath sounds.     Comments: Coughs during exam, few rhonchi Musculoskeletal:     Cervical back: Normal range of motion.  Skin:    General: Skin is warm.  Neurological:     General: No focal deficit present.     Mental Status: She is alert.  Psychiatric:        Mood and Affect: Mood normal.        Behavior: Behavior normal.        Assessment And Plan:     1. Type 2 diabetes mellitus with stage 3a chronic kidney disease, with long-term current use of insulin (HCC) Comments: Chronic, I will check labs as listed below.  I will request her 2022 eye exam  from Dr. Venetia Maxon. - CMP14+EGFR - Hemoglobin A1c - POCT UA - Microalbumin  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 Goal BP <130/80. Encouraged to follow low sodium diet.   3. Postnasal drip Comments: She is now getting over recent flu, diagnosed Thanksgiving. Advised to avoid dairy for another two weeks. Given samples of Zyrtec to take nightly prn. I  will request her records from [facility].  4. OSA (obstructive sleep apnea) Comments: Sleep study results from 2021 reviewed. Risks of untreated OSA including heart failure, MI and CVA were d/w patient.   5. Class 2 severe obesity with body mass index (BMI) of 35 to 39.9 with serious comorbidity (HCC) Comments: She is encouraged to strive for BMI less than 30 to decrease cardiac risk. Advised to gradually increase her daily activity.    Patient was given opportunity to ask questions. Patient verbalized understanding of the plan and was able to repeat key elements of the plan. All questions were answered to their satisfaction.   I, [name], MD, have reviewed all documentation for this visit. The documentation on 02/16/21 for the exam, diagnosis, procedures, and orders are all accurate and complete.   IF YOU HAVE BEEN REFERRED TO A SPECIALIST, IT MAY TAKE 1-2 WEEKS TO SCHEDULE/PROCESS THE REFERRAL. IF YOU HAVE NOT HEARD FROM US/SPECIALIST IN TWO WEEKS, PLEASE GIVE [place] A CALL AT [phone] X [phone].   THE PATIENT IS ENCOURAGED TO PRACTICE SOCIAL DISTANCING DUE TO THE COVID-19 PANDEMIC.

## 2021-02-17 LAB — CMP14+EGFR
ALT: 16 IU/L (ref 0–32)
AST: 7 IU/L (ref 0–40)
Albumin/Globulin Ratio: 1.4 (ref 1.2–2.2)
Albumin: 4.1 g/dL (ref 3.7–4.7)
Alkaline Phosphatase: 69 IU/L (ref 44–121)
BUN/Creatinine Ratio: 19 (ref 12–28)
BUN: 20 mg/dL (ref 8–27)
Bilirubin Total: 0.5 mg/dL (ref 0.0–1.2)
CO2: 26 mmol/L (ref 20–29)
Calcium: 9.6 mg/dL (ref 8.7–10.3)
Chloride: 101 mmol/L (ref 96–106)
Creatinine, Ser: 1.08 mg/dL — ABNORMAL HIGH (ref 0.57–1.00)
Globulin, Total: 3 g/dL (ref 1.5–4.5)
Glucose: 92 mg/dL (ref 70–99)
Potassium: 3.8 mmol/L (ref 3.5–5.2)
Sodium: 142 mmol/L (ref 134–144)
Total Protein: 7.1 g/dL (ref 6.0–8.5)
eGFR: 53 mL/min/{1.73_m2} — ABNORMAL LOW (ref >59)

## 2021-02-17 LAB — HEMOGLOBIN A1C
Est. average glucose Bld gHb Est-mCnc: 160 mg/dL
Hgb A1c MFr Bld: 7.2 % — ABNORMAL HIGH (ref 4.8–5.6)

## 2021-02-19 ENCOUNTER — Other Ambulatory Visit: Payer: Self-pay

## 2021-02-19 DIAGNOSIS — N1831 Chronic kidney disease, stage 3a: Secondary | ICD-10-CM

## 2021-02-20 ENCOUNTER — Ambulatory Visit
Admission: RE | Admit: 2021-02-20 | Discharge: 2021-02-20 | Disposition: A | Discharge status: Home / Self Care | Payer: BC Managed Care – PPO | Source: Ambulatory Visit | Attending: Internal Medicine | Admitting: Internal Medicine

## 2021-02-20 DIAGNOSIS — N1831 Chronic kidney disease, stage 3a: Secondary | ICD-10-CM

## 2021-02-20 LAB — SPECIMEN STATUS REPORT

## 2021-02-20 LAB — AMYLASE: Amylase: 91 U/L (ref 31–110)

## 2021-03-03 ENCOUNTER — Other Ambulatory Visit: Payer: Self-pay | Admitting: Pharmacist

## 2021-03-03 DIAGNOSIS — E785 Hyperlipidemia, unspecified: Secondary | ICD-10-CM

## 2021-03-03 MED ORDER — PRALUENT 75 MG/ML ~~LOC~~ SOAJ: 75.0000 mg | SUBCUTANEOUS | 11 refills | Status: DC

## 2021-03-08 ENCOUNTER — Other Ambulatory Visit: Payer: Self-pay | Admitting: Internal Medicine

## 2021-03-17 ENCOUNTER — Other Ambulatory Visit: Payer: Self-pay

## 2021-03-17 MED ORDER — CLOPIDOGREL BISULFATE 75 MG PO TABS: 75.0000 mg | ORAL_TABLET | Freq: Every day | ORAL | 3 refills | Status: DC

## 2021-03-17 NOTE — Telephone Encounter (Signed)
Pt's medication was sent to pt's pharmacy as requested. Confirmation received.  °

## 2021-03-31 ENCOUNTER — Other Ambulatory Visit: Payer: Self-pay | Admitting: *Deleted

## 2021-03-31 MED ORDER — SPIRONOLACTONE-HCTZ 25-25 MG PO TABS: 1.0000 | ORAL_TABLET | Freq: Every day | ORAL | 3 refills | Status: DC

## 2021-04-06 ENCOUNTER — Other Ambulatory Visit: Payer: Self-pay

## 2021-04-06 ENCOUNTER — Encounter: Payer: Self-pay | Admitting: Internal Medicine

## 2021-04-06 ENCOUNTER — Ambulatory Visit: Payer: BC Managed Care – PPO | Admitting: Internal Medicine

## 2021-04-06 VITALS — BP 130/84 | HR 81 | Temp 98.7°F | Ht 63.4 in | Wt 204.6 lb

## 2021-04-06 DIAGNOSIS — Z23 Encounter for immunization: Secondary | ICD-10-CM

## 2021-04-06 DIAGNOSIS — N1831 Chronic kidney disease, stage 3a: Secondary | ICD-10-CM | POA: Diagnosis not present

## 2021-04-06 DIAGNOSIS — I739 Peripheral vascular disease, unspecified: Secondary | ICD-10-CM | POA: Diagnosis not present

## 2021-04-06 DIAGNOSIS — H6123 Impacted cerumen, bilateral: Secondary | ICD-10-CM | POA: Diagnosis not present

## 2021-04-06 DIAGNOSIS — E1122 Type 2 diabetes mellitus with diabetic chronic kidney disease: Secondary | ICD-10-CM | POA: Diagnosis not present

## 2021-04-06 DIAGNOSIS — Z794 Long term (current) use of insulin: Secondary | ICD-10-CM

## 2021-04-06 DIAGNOSIS — R42 Dizziness and giddiness: Secondary | ICD-10-CM

## 2021-04-06 MED ORDER — MECLIZINE HCL 12.5 MG PO TABS: 12.5000 mg | ORAL_TABLET | Freq: Three times a day (TID) | ORAL | 0 refills | Status: DC | PRN

## 2021-04-06 NOTE — Progress Notes (Signed)
Rich Brave [place]leton,acting as a Education administrator for [name], MD.,have documented all relevant documentation on the behalf of [name], MD,as directed by  [name], MD while in the presence of [name], MD.  This visit occurred during the SARS-CoV-2 public health emergency.  Safety protocols were in place, including screening questions prior to the visit, additional usage of staff PPE, and extensive cleaning of exam room while observing appropriate contact time as indicated for disinfecting solutions.  Subjective:     Patient ID: Dawn Wood , female    DOB: September 01, 1945 , 76 y.o.   MRN: [id number]   Chief Complaint  Patient presents with   Dizziness    HPI  Patient reports she feels dizzy when she is laying down on her right side. She stated when she closes her eyes she feels like her head is spinning. Symptoms started about 3 weeks ago. Denies recent cold/URI. She is not sure what may have triggered her sx. She denies associated nausea.   Dizziness This is a new problem. The current episode started 1 to 4 weeks ago. Episode frequency: only when she lays down it happens for a few mins. The problem has been unchanged. Nothing aggravates the symptoms. She has tried nothing for the symptoms.    Past Medical History:  Diagnosis Date   Coronary artery disease    Diabetes mellitus without complication (HCC)    GERD (gastroesophageal reflux disease)    Hypertension    MI (myocardial infarction) ([place])    Peripheral vascular disease ([place])      Family History  Problem Relation Age of Onset   Aortic stenosis Daughter    Heart disease Daughter        before age 66   Hypertension Mother    Cancer Father    Hypertension Father    Thyroid disease Daughter    Breast cancer Neg Hx      Current Outpatient Medications:    Alirocumab (PRALUENT) 75 MG/ML SOAJ, Inject 75 mg into the skin every 14 (fourteen) days., Disp: 2 mL, Rfl: 11   amLODipine (NORVASC) 5 MG  tablet, Take 1 tablet (5 mg total) by mouth daily., Disp: 90 tablet, Rfl: 3   BD PEN NEEDLE NANO 2ND GEN 32G X 4 MM MISC, USE AS DIRECTED TWICE DAILY, Disp: 100 each, Rfl: 2   Cholecalciferol (VITAMIN D3) 2000 units TABS, Take 1 tablet by mouth daily., Disp: , Rfl:    clopidogrel (PLAVIX) 75 MG tablet, Take 1 tablet (75 mg total) by mouth daily., Disp: 90 tablet, Rfl: 3   Coenzyme Q10 (CO Q 10) 10 MG CAPS, Take by mouth daily., Disp: , Rfl:    colchicine 0.6 MG tablet, Take 1 tablet by mouth daily. If you experience a flare, increase to 2 tablets by mouth daily., Disp: 60 tablet, Rfl: 2   fluticasone (FLONASE) 50 MCG/ACT nasal spray, Place 1 spray into both nostrils daily as needed for allergies or rhinitis., Disp: 16 g, Rfl: 2   meclizine (ANTIVERT) 12.5 MG tablet, Take 1 tablet (12.5 mg total) by mouth 3 (three) times daily as needed for dizziness., Disp: 30 tablet, Rfl: 0   metoprolol succinate (TOPROL-XL) 100 MG 24 hr tablet, TAKE 1 TABLET BY MOUTH IMMEDIATELY FOLLOWING A MEAL, Disp: 90 tablet, Rfl: 1   nitroGLYCERIN (NITROSTAT) 0.4 MG SL tablet, Place 1 tablet (0.4 mg total) under the tongue every 5 (five) minutes as needed for chest pain., Disp: 25 tablet, Rfl: 3  NOVOLOG MIX 70/30 FLEXPEN (70-30) 100 UNIT/ML FlexPen, INJECT SUBCUTANEOUS 40 UNITS IN THE MORNING AND 45 UNITS AT BEDTIME, Disp: 75 mL, Rfl: 2   olmesartan (BENICAR) 40 MG tablet, TAKE 1 TABLET(40 MG) BY MOUTH DAILY, Disp: 90 tablet, Rfl: 2   ONETOUCH VERIO test strip, USE TO CHECK BLOOD SUGARS TWICE DAILY AS DIRECTED, Disp: 100 strip, Rfl: 3   Semaglutide,0.25 or 0.5MG /DOS, (OZEMPIC, 0.25 OR 0.5 MG/DOSE,) 2 MG/1.5ML SOPN, INJECT 0.5MG  INTO SKIN ONCE A WEEK, Disp: 4.5 mL, Rfl: 1   spironolactone-hydrochlorothiazide (ALDACTAZIDE) 25-25 MG tablet, Take 1 tablet by mouth daily., Disp: 90 tablet, Rfl: 3   dexlansoprazole (DEXILANT) 60 MG capsule, Take 1 capsule (60 mg total) by mouth daily. (Patient not taking: Reported on 02/16/2021),  Disp: 30 capsule, Rfl: 1   lidocaine-prilocaine (EMLA) cream, , Disp: , Rfl:    Allergies  Allergen Reactions   Dust Mite Extract Other (See Comments)    REACTION: SNEEZING   Other Other (See Comments)    ALLERGEN: DEODORIZERS SPRAY AND PERFUMES REACTION: SNEEZING   Statins Other (See Comments)    Muscle aches with rosuvastatin 5 mg daily and 3x/weekly, atorvastatin 20 mg daily and 3x/week, simvastatin 40 mg daily    Allopurinol     Diarrhea and the pt said it made her sugars low   Pollen Extract Other (See Comments)    Reaction unknown     Review of Systems  Constitutional: Negative.   HENT: Negative.    Respiratory: Negative.    Cardiovascular: Negative.   Gastrointestinal: Negative.   Neurological:  Positive for dizziness.  Psychiatric/Behavioral: Negative.      Today's Vitals   04/06/21 1532 04/06/21 1537 04/06/21 1539  BP: 136/78 136/70 130/84  Pulse: 68 71 81  Temp: 98.7 F (37.1 C)    Weight: 204 lb 9.6 oz (92.8 kg)    Height: 5' 3.4" (1.61 m)    PainSc: 0-No pain     Body mass index is 35.79 kg/m.  Wt Readings from Last 3 Encounters:  04/06/21 204 lb 9.6 oz (92.8 kg)  02/16/21 207 lb (93.9 kg)  01/12/21 207 lb (93.9 kg)     Objective:  Physical Exam Vitals and nursing note reviewed.  Constitutional:      Appearance: Normal appearance.  HENT:     Head: Normocephalic and atraumatic.     Right Ear: Ear canal and external ear normal. There is impacted cerumen.     Left Ear: Ear canal and external ear normal. There is impacted cerumen.  Cardiovascular:     Rate and Rhythm: Normal rate and regular rhythm.     Heart sounds: Normal heart sounds.  Pulmonary:     Effort: Pulmonary effort is normal.     Breath sounds: Normal breath sounds.  Musculoskeletal:     Cervical back: Normal range of motion.  Skin:    General: Skin is warm.  Neurological:     General: No focal deficit present.     Mental Status: She is alert.  Psychiatric:        Mood and  Affect: Mood normal.        Behavior: Behavior normal.        Assessment And Plan:     1. Dizziness Comments: Sx are suggestive of vertigo. However, the b/l cerumen impaction could also be contributing to her sx.  - CBC no Diff  2. Bilateral impacted cerumen Comments: Pt advised this is likely contributing to her sx of dizziness. AFTER OBTAINING  VERBAL CONSENT, RIGHT EAR WAS FLUSHED BY IRRIGATION. SHE TOLERATED PROCEDURE WELL WITHOUT ANY COMPLICATIONS. NO TM ABNORMALITIES WERE NOTED.  - Ear Lavage  3. Type 2 diabetes mellitus with stage 3a chronic kidney disease, with long-term current use of insulin ([place]) Comments: Chronic, last a1c 7.06 Feb 2021. The importance of dietary/medication compliance was d/w patient.   4. PVD (peripheral vascular disease) ([place]) Comments: Chronic, importance of statin compliance and regular exercise was d/w patient.   5. 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 She is encouraged to strive for BMI <30 to decrease cardiac risk. Advised to aim for at least 150 inutes of exercise per week.   6. Immunization due Comments: She was given high dose flu vaccine.  - Flu Vaccine QUAD High Dose(Fluad)   Patient was given opportunity to ask questions. Patient verbalized understanding of the plan and was able to repeat key elements of the plan. All questions were answered to their satisfaction.   I, [name], MD, have reviewed all documentation for this visit. The documentation on 04/06/21 for the exam, diagnosis, procedures, and orders are all accurate and complete.   IF YOU HAVE BEEN REFERRED TO A SPECIALIST, IT MAY TAKE 1-2 WEEKS TO SCHEDULE/PROCESS THE REFERRAL. IF YOU HAVE NOT HEARD FROM US/SPECIALIST IN TWO WEEKS, PLEASE GIVE [place] A CALL AT [phone].   THE PATIENT IS ENCOURAGED TO PRACTICE SOCIAL DISTANCING DUE TO THE COVID-19 PANDEMIC.

## 2021-04-06 NOTE — Patient Instructions (Signed)
Earwax Buildup, Adult ?The ears produce a substance called earwax that helps keep bacteria out of the ear and protects the skin in the ear canal. Occasionally, earwax can build up in the ear and cause discomfort or hearing loss. ?What are the causes? ?This condition is caused by a buildup of earwax. Ear canals are self-cleaning. Ear wax is made in the outer part of the ear canal and generally falls out in small amounts over time. ?When the self-cleaning mechanism is not working, earwax builds up and can cause decreased hearing and discomfort. Attempting to clean ears with cotton swabs can push the earwax deep into the ear canal and cause decreased hearing and pain. ?What increases the risk? ?This condition is more likely to develop in people who: ?Clean their ears often with cotton swabs. ?Pick at their ears. ?Use earplugs or in-ear headphones often, or wear hearing aids. ?The following factors may also make you more likely to develop this condition: ?Being female. ?Being of older age. ?Naturally producing more earwax. ?Having narrow ear canals. ?Having earwax that is overly thick or sticky. ?Having excess hair in the ear canal. ?Having eczema. ?Being dehydrated. ?What are the signs or symptoms? ?Symptoms of this condition include: ?Reduced or muffled hearing. ?A feeling of fullness in the ear or feeling that the ear is plugged. ?Fluid coming from the ear. ?Ear pain or an itchy ear. ?Ringing in the ear. ?Coughing. ?Balance problems. ?An obvious piece of earwax that can be seen inside the ear canal. ?How is this diagnosed? ?This condition may be diagnosed based on: ?Your symptoms. ?Your medical history. ?An ear exam. During the exam, your health care provider will look into your ear with an instrument called an otoscope. ?You may have tests, including a hearing test. ?How is this treated? ?This condition may be treated by: ?Using ear drops to soften the earwax. ?Having the earwax removed by a health care provider. The  health care provider may: ?Flush the ear with water. ?Use an instrument that has a loop on the end (curette). ?Use a suction device. ?Having surgery to remove the wax buildup. This may be done in severe cases. ?Follow these instructions at home: ? ?Take over-the-counter and prescription medicines only as told by your health care provider. ?Do not put any objects, including cotton swabs, into your ear. You can clean the opening of your ear canal with a washcloth or facial tissue. ?Follow instructions from your health care provider about cleaning your ears. Do not overclean your ears. ?Drink enough fluid to keep your urine pale yellow. This will help to thin the earwax. ?Keep all follow-up visits as told. If earwax builds up in your ears often or if you use hearing aids, consider seeing your health care provider for routine, preventive ear cleanings. Ask your health care provider how often you should schedule your cleanings. ?If you have hearing aids, clean them according to instructions from the manufacturer and your health care provider. ?Contact a health care provider if: ?You have ear pain. ?You develop a fever. ?You have pus or other fluid coming from your ear. ?You have hearing loss. ?You have ringing in your ears that does not go away. ?You feel like the room is spinning (vertigo). ?Your symptoms do not improve with treatment. ?Get help right away if: ?You have bleeding from the affected ear. ?You have severe ear pain. ?Summary ?Earwax can build up in the ear and cause discomfort or hearing loss. ?The most common symptoms of this condition include   reduced or muffled hearing, a feeling of fullness in the ear, or feeling that the ear is plugged. ?This condition may be diagnosed based on your symptoms, your medical history, and an ear exam. ?This condition may be treated by using ear drops to soften the earwax or by having the earwax removed by a health care provider. ?Do not put any objects, including cotton  swabs, into your ear. You can clean the opening of your ear canal with a washcloth or facial tissue. ?This information is not intended to replace advice given to you by your health care provider. Make sure you discuss any questions you have with your health care provider. ?Document Revised: 06/12/2019 Document Reviewed: 06/12/2019 ?Elsevier Patient Education ? 2022 [employer] ? ?

## 2021-04-29 ENCOUNTER — Other Ambulatory Visit: Payer: Self-pay | Admitting: Internal Medicine

## 2021-04-30 ENCOUNTER — Other Ambulatory Visit: Payer: Self-pay | Admitting: *Deleted

## 2021-04-30 MED ORDER — NITROGLYCERIN 0.4 MG SL SUBL: 0.4000 mg | SUBLINGUAL_TABLET | SUBLINGUAL | 3 refills | Status: DC | PRN

## 2021-05-14 ENCOUNTER — Ambulatory Visit: Payer: Medicare Other | Admitting: Neurology

## 2021-05-21 LAB — HM DIABETES EYE EXAM

## 2021-05-26 ENCOUNTER — Telehealth: Payer: Self-pay | Admitting: Interventional Cardiology

## 2021-05-26 NOTE — Telephone Encounter (Signed)
Advised patient we do not have an update from the MD yet.  ?Verbalized understanding.  ?

## 2021-05-26 NOTE — Telephone Encounter (Signed)
Pt c/o of Chest Pain: STAT if CP now or developed within 24 hours ? ?1. Are you having CP right now? Discomfort in chest ,more like a pressure and sore ? ?2. Are you experiencing any other symptoms (ex. SOB, nausea, vomiting, sweating)? Left arm was aching and numb- but not at this time ? ?3. How long have you been experiencing CP? Started last night ? ?4. Is your CP continuous or coming and going?  ? ?5. Have you taken Nitroglycerin? yes ?? ? ?

## 2021-05-26 NOTE — Telephone Encounter (Signed)
Patient called to check on status of her call.  

## 2021-05-26 NOTE — Telephone Encounter (Signed)
Pt reports that she was awoken from sleep about midnight, with CP. ?Before she went to bed she noticed her left hand was numb feeling.  She also thought she was having some indigestion and took some Rolaids, but this did not relieve feeling and her left arm started hurting. ?She then got real hot and had to go to front door for some air. ?Pain eventually stopped and she went back to sleep. ?Says she did NOT take any nitro, but she did take 4 baby ASA.  ?Pain was 8/9 out of 10 ?Unsure how long incident occurred, she just fell back asleep eventually. ?She does acknowledge symptoms similar to her heart attack symptoms back in 2015. ?She does have some abdominal swelling according to pt ?She also complains of shoulder soreness. ? ? ?Denies current CP/symptoms.   ?Pt advised if this reoccurs to go call 911 or go straight to ED (states she almost went this morning but didn't). ?Aware I will forward to Dr. Katrinka Blazing for review/advisement. ?Aware he may want to order some cardiology testing. ?Aware will call her w/ MD recommendation. ?Reiterated/stressed 911/ED if pain reoccurs. ?Patient verbalized understanding and agreeable to plan.  ? ? ? ?

## 2021-05-27 NOTE — Telephone Encounter (Signed)
Spoke with pt and scheduled her to come in tomorrow to see Dr. Katrinka Blazing.  Advised I will call back with recommendations if he has any prior to appt.  Pt appreciative for call.  ?

## 2021-05-27 NOTE — Telephone Encounter (Signed)
? ?  Pt is calling to f/u. She said to call her at [phone] ?

## 2021-05-27 NOTE — Telephone Encounter (Signed)
Called pt reviewed MD recommendations.  Pt reports feels a little better; able to go to work today and has OV scheduled for 05/28/21. Pt had no further questions or concerns.  ?

## 2021-05-28 ENCOUNTER — Ambulatory Visit: Payer: BC Managed Care – PPO | Admitting: Interventional Cardiology

## 2021-05-28 ENCOUNTER — Other Ambulatory Visit: Payer: Self-pay

## 2021-05-28 ENCOUNTER — Encounter: Payer: Self-pay | Admitting: *Deleted

## 2021-05-28 ENCOUNTER — Encounter: Payer: Self-pay | Admitting: Interventional Cardiology

## 2021-05-28 VITALS — BP 136/76 | HR 59 | Ht 65.0 in | Wt 207.6 lb

## 2021-05-28 DIAGNOSIS — I2510 Atherosclerotic heart disease of native coronary artery without angina pectoris: Secondary | ICD-10-CM | POA: Diagnosis not present

## 2021-05-28 DIAGNOSIS — I739 Peripheral vascular disease, unspecified: Secondary | ICD-10-CM

## 2021-05-28 DIAGNOSIS — E785 Hyperlipidemia, unspecified: Secondary | ICD-10-CM

## 2021-05-28 DIAGNOSIS — I10 Essential (primary) hypertension: Secondary | ICD-10-CM

## 2021-05-28 NOTE — Patient Instructions (Signed)
Medication Instructions:  ?Your physician recommends that you continue on your current medications as directed. Please refer to the Current Medication list given to you today. ? ?*If you need a refill on your cardiac medications before your next appointment, please call your pharmacy* ? ? ?Lab Work: ?None ?If you have labs (blood work) drawn today and your tests are completely normal, you will receive your results only by: ?MyChart Message (if you have MyChart) OR ?A paper copy in the mail ?If you have any lab test that is abnormal or we need to change your treatment, we will call you to review the results. ? ? ?Testing/Procedures: ?Your physician has requested that you have a lexiscan myoview. For further information please visit [email or web address] Please follow instruction sheet, as given. ? ? ?Follow-Up: ?At [facility], you and your health needs are our priority.  As part of our continuing mission to provide you with exceptional heart care, we have created designated Provider Care Teams.  These Care Teams include your primary Cardiologist (physician) and Advanced Practice Providers (APPs -  Physician Assistants and Nurse Practitioners) who all work together to provide you with the care you need, when you need it. ? ?We recommend signing up for the patient portal called "MyChart".  Sign up information is provided on this After Visit Summary.  MyChart is used to connect with patients for Virtual Visits (Telemedicine).  Patients are able to view lab/test results, encounter notes, upcoming appointments, etc.  Non-urgent messages can be sent to your provider as well.   ?To learn more about what you can do with MyChart, go to [email or web address].   ? ?Your next appointment:   ?6 month(s) ? ?The format for your next appointment:   ?In Person ? ?Provider:   ?Lesleigh Noe, MD  ? ? ?Other Instructions ?  ?

## 2021-05-28 NOTE — Progress Notes (Signed)
?Cardiology Office Note:   ? ?Date:  05/28/2021  ? ?IDKhira Cudmore, DOB January 25, 1946, MRN [id number] ? ?PCP:  Dorothyann Peng, MD  ?Cardiologist:  Lesleigh Noe, MD  ? ?Referring MD: Dorothyann Peng, MD  ? ?Chief Complaint  ?Patient presents with  ? Coronary Artery Disease  ?  Angina pectoris  ? ? ?History of Present Illness:   ? ?Dawn Wood is a 76 y.o. female with a hx of  CAD With prior LAD DE stent (2015 Dr. Park Breed), hypertension, DM II, PAD, osteoarthritis and gouty arthritis, and hyperlipidemia.  ? ? ?At midnight this past Sunday, she was awakened with pressure in her chest that had associated left arm numbness and discomfort.  It awakened her from sleep.  She developed diaphoresis.  There was no dyspnea.  The episode lasted less than 20 minutes before gradually resolving.  She did not use a nitroglycerin tablet. ? ?Over the past 3 days there have been no recurring episodes.  She is not have any reduction or change in exertional tolerance.  She has nitroglycerin but did not consider using it. ? ?Past Medical History:  ?Diagnosis Date  ? Coronary artery disease   ? Diabetes mellitus without complication (HCC)   ? GERD (gastroesophageal reflux disease)   ? Hypertension   ? MI (myocardial infarction) (HCC)   ? Peripheral vascular disease (HCC)   ? ? ?Past Surgical History:  ?Procedure Laterality Date  ? ABDOMINAL HYSTERECTOMY  1985  ? APPENDECTOMY    ? CATARACT EXTRACTION Left 02/20/2018  ? Dr. Harlon Flor  ? CORONARY STENT PLACEMENT  2015  ? ectopic pregnancy--unilateral salpingectomy  1980  ? KNEE ARTHROSCOPY Left 03/2016  ? unilateral oophorectomy    ? ? ?Current Medications: ?Current Meds  ?Medication Sig  ? Alirocumab (PRALUENT) 75 MG/ML SOAJ Inject 75 mg into the skin every 14 (fourteen) days.  ? amLODipine (NORVASC) 5 MG tablet Take 1 tablet (5 mg total) by mouth daily.  ? BD PEN NEEDLE NANO 2ND GEN 32G X 4 MM MISC USE AS DIRECTED TWICE DAILY  ? Cholecalciferol (VITAMIN D3) 2000 units  TABS Take 1 tablet by mouth daily.  ? clopidogrel (PLAVIX) 75 MG tablet Take 1 tablet (75 mg total) by mouth daily.  ? Coenzyme Q10 (CO Q 10) 10 MG CAPS Take by mouth daily.  ? colchicine 0.6 MG tablet Take 1 tablet by mouth daily. If you experience a flare, increase to 2 tablets by mouth daily.  ? fluticasone (FLONASE) 50 MCG/ACT nasal spray Place 1 spray into both nostrils daily as needed for allergies or rhinitis.  ? meclizine (ANTIVERT) 12.5 MG tablet Take 1 tablet (12.5 mg total) by mouth 3 (three) times daily as needed for dizziness.  ? metoprolol succinate (TOPROL-XL) 100 MG 24 hr tablet TAKE 1 TABLET BY MOUTH IMMEDIATELY FOLLOWING A MEAL  ? nitroGLYCERIN (NITROSTAT) 0.4 MG SL tablet Place 1 tablet (0.4 mg total) under the tongue every 5 (five) minutes as needed for chest pain.  ? NOVOLOG MIX 70/30 FLEXPEN (70-30) 100 UNIT/ML FlexPen INJECT SUBCUTANEOUS 40 UNITS IN THE MORNING AND 45 UNITS AT BEDTIME  ? olmesartan (BENICAR) 40 MG tablet TAKE 1 TABLET(40 MG) BY MOUTH DAILY  ? ONETOUCH VERIO test strip USE TO CHECK BLOOD SUGARS TWICE DAILY AS DIRECTED  ? Semaglutide,0.25 or 0.5MG /DOS, (OZEMPIC, 0.25 OR 0.5 MG/DOSE,) 2 MG/1.5ML SOPN INJECT 0.5MG  INTO SKIN ONCE A WEEK  ? spironolactone-hydrochlorothiazide (ALDACTAZIDE) 25-25 MG tablet Take 1 tablet by mouth daily.  ?  ? ?  Allergies:   Dust mite extract, Other, Statins, Allopurinol, and Pollen extract  ? ?Social History  ? ?Socioeconomic History  ? Marital status: Single  ?  Spouse name: Not on file  ? Number of children: Not on file  ? Years of education: Not on file  ? Highest education level: Not on file  ?Occupational History  ? Not on file  ?Tobacco Use  ? Smoking status: Former  ?  Packs/day: 0.25  ?  Years: 45.00  ?  Pack years: 11.25  ?  Types: Cigarettes  ?  Quit date: 01/10/2013  ?  Years since quitting: 8.3  ? Smokeless tobacco: Never  ?Vaping Use  ? Vaping Use: Never used  ?Substance and Sexual Activity  ? Alcohol use: Not Currently  ?  Alcohol/week: 0.0  standard drinks  ? Drug use: No  ? Sexual activity: Not on file  ?Other Topics Concern  ? Not on file  ?Social History Narrative  ? Not on file  ? ?Social Determinants of Health  ? ?Financial Resource Strain: Not on file  ?Food Insecurity: Not on file  ?Transportation Needs: Not on file  ?Physical Activity: Not on file  ?Stress: Not on file  ?Social Connections: Not on file  ?  ? ?Family History: ?The patient's family history includes Aortic stenosis in her daughter; Cancer in her father; Heart disease in her daughter; Hypertension in her father and mother; Thyroid disease in her daughter. There is no history of Breast cancer. ? ?ROS:   ?Please see the history of present illness.    ?Plans to retire in May.  All other systems reviewed and are negative. ? ?EKGs/Labs/Other Studies Reviewed:   ? ?The following studies were reviewed today: ?No new tracing or imaging ? ?EKG:  EKG normal sinus rhythm with normal EKG appearance.  Heart rate 59.  Prominent voltage. ? ?Recent Labs: ?10/14/2020: Hemoglobin 13.2; Platelets 265; TSH 1.180 ?02/16/2021: ALT 16; BUN 20; Creatinine, Ser 1.08; Potassium 3.8; Sodium 142  ?Recent Lipid Panel ?   ?Component Value Date/Time  ? CHOL 109 08/18/2020 0954  ? CHOL 167 05/28/2013 0413  ? TRIG 108 08/18/2020 0954  ? TRIG 180 05/28/2013 0413  ? HDL 45 08/18/2020 0954  ? HDL 37 (L) 05/28/2013 0413  ? CHOLHDL 2.4 08/18/2020 0954  ? CHOLHDL 3 12/02/2014 0856  ? VLDL 22.0 12/02/2014 0856  ? VLDL 36 05/28/2013 0413  ? LDLCALC 44 08/18/2020 0954  ? LDLCALC 94 05/28/2013 0413  ? LDLDIRECT 83 06/11/2019 1425  ? ? ?Physical Exam:   ? ?VS:  BP 136/76   Pulse (!) 59   Ht 5\' 5"  (1.651 m)   Wt 207 lb 9.6 oz (94.2 kg)   SpO2 96%   BMI 34.55 kg/m?    ? ?Wt Readings from Last 3 Encounters:  ?05/28/21 207 lb 9.6 oz (94.2 kg)  ?04/06/21 204 lb 9.6 oz (92.8 kg)  ?02/16/21 207 lb (93.9 kg)  ?  ? ?GEN: Overweight. No acute distress ?HEENT: Normal ?NECK: No JVD. ?LYMPHATICS: No lymphadenopathy ?CARDIAC: No murmur  or rub. RRR no gallop, or edema. ?VASCULAR:  Normal Pulses. No bruits. ?RESPIRATORY:  Clear to auscultation without rales, wheezing or rhonchi  ?ABDOMEN: Soft, non-tender, non-distended, No pulsatile mass, ?MUSCULOSKELETAL: No deformity  ?SKIN: Warm and dry ?NEUROLOGIC:  Alert and oriented x 3 ?PSYCHIATRIC:  Normal affect  ? ?ASSESSMENT:   ? ?1. CAD in native artery   ?2. Essential hypertension   ?3. PVD (peripheral vascular disease) (HCC)   ?  4. Dyslipidemia   ? ?PLAN:   ? ?In order of problems listed above: ? ?The episode of chest discomfort sounds like angina.  I instructed on the use of nitroglycerin.  Absence of recurrent pain is reassuring in the presence of abnormal EKG.  Plan myocardial perfusion imaging with Lexiscan stress.  If high risk will need coronary angiography.  If recurrent angina and interval, she should use sublingual nitroglycerin and depending upon the clinical features, may decide to go straight to coronary angiography. ?Blood pressure is controlled ?Would prevent walking on a treadmill ?Continue statin therapy ? ? ?Medication Adjustments/Labs and Tests Ordered: ?Current medicines are reviewed at length with the patient today.  Concerns regarding medicines are outlined above.  ?Orders Placed This Encounter  ?Procedures  ? MYOCARDIAL PERFUSION IMAGING  ? EKG 12-Lead  ? ?No orders of the defined types were placed in this encounter. ? ? ?Patient Instructions  ?Medication Instructions:  ?Your physician recommends that you continue on your current medications as directed. Please refer to the Current Medication list given to you today. ? ?*If you need a refill on your cardiac medications before your next appointment, please call your pharmacy* ? ? ?Lab Work: ?None ?If you have labs (blood work) drawn today and your tests are completely normal, you will receive your results only by: ?MyChart Message (if you have MyChart) OR ?A paper copy in the mail ?If you have any lab test that is abnormal or we  need to change your treatment, we will call you to review the results. ? ? ?Testing/Procedures: ?Your physician has requested that you have a lexiscan myoview. For further information please visit [email or web address]

## 2021-05-29 ENCOUNTER — Telehealth (HOSPITAL_COMMUNITY): Payer: Self-pay | Admitting: *Deleted

## 2021-05-29 NOTE — Telephone Encounter (Signed)
Left message on voicemail per DPR in reference to upcoming appointment scheduled on 06/05/21 at 7:30 with detailed instructions given per Myocardial Perfusion Study Information Sheet for the test. LM to arrive 15 minutes early, and that it is imperative to arrive on time for appointment to keep from having the test rescheduled. If you need to cancel or reschedule your appointment, please call the office within 24 hours of your appointment. Failure to do so may result in a cancellation of your appointment, and a $50 no show fee. Phone number given for call back for any questions.  ? ?

## 2021-06-02 ENCOUNTER — Other Ambulatory Visit: Payer: Self-pay | Admitting: Internal Medicine

## 2021-06-05 ENCOUNTER — Ambulatory Visit (HOSPITAL_COMMUNITY): Payer: BC Managed Care – PPO | Attending: Interventional Cardiology

## 2021-06-05 DIAGNOSIS — I2510 Atherosclerotic heart disease of native coronary artery without angina pectoris: Secondary | ICD-10-CM | POA: Insufficient documentation

## 2021-06-05 LAB — MYOCARDIAL PERFUSION IMAGING
LV dias vol: 77 mL (ref 46–106)
LV sys vol: 24 mL
Nuc Stress EF: 69 %
Peak HR: 76 {beats}/min
Rest HR: 53 {beats}/min
Rest Nuclear Isotope Dose: 10.1 mCi
SDS: 3
SRS: 2
SSS: 5
ST Depression (mm): 0 mm
Stress Nuclear Isotope Dose: 31.3 mCi
TID: 1.09

## 2021-06-05 MED ORDER — TECHNETIUM TC 99M TETROFOSMIN IV KIT
10.1000 | PACK | Freq: Once | INTRAVENOUS | Status: AC | PRN
  Administered 2021-06-05: 10.1 via INTRAVENOUS
  Filled 2021-06-05: qty 11

## 2021-06-05 MED ORDER — TECHNETIUM TC 99M TETROFOSMIN IV KIT
31.3000 | PACK | Freq: Once | INTRAVENOUS | Status: AC | PRN
  Administered 2021-06-05: 31.3 via INTRAVENOUS
  Filled 2021-06-05: qty 32

## 2021-06-05 MED ORDER — REGADENOSON 0.4 MG/5ML IV SOLN
0.4000 mg | Freq: Once | INTRAVENOUS | Status: AC
  Administered 2021-06-05: 0.4 mg via INTRAVENOUS

## 2021-06-12 ENCOUNTER — Other Ambulatory Visit: Payer: Self-pay | Admitting: Internal Medicine

## 2021-06-18 ENCOUNTER — Encounter: Payer: Self-pay | Admitting: Internal Medicine

## 2021-06-18 ENCOUNTER — Ambulatory Visit: Payer: BC Managed Care – PPO | Admitting: Internal Medicine

## 2021-06-18 ENCOUNTER — Other Ambulatory Visit: Payer: Self-pay

## 2021-06-18 VITALS — BP 112/80 | HR 60 | Temp 98.6°F | Ht 65.0 in | Wt 208.6 lb

## 2021-06-18 DIAGNOSIS — N1831 Chronic kidney disease, stage 3a: Secondary | ICD-10-CM

## 2021-06-18 DIAGNOSIS — I2510 Atherosclerotic heart disease of native coronary artery without angina pectoris: Secondary | ICD-10-CM

## 2021-06-18 DIAGNOSIS — Z794 Long term (current) use of insulin: Secondary | ICD-10-CM

## 2021-06-18 DIAGNOSIS — I1310 Hypertensive heart and chronic kidney disease without heart failure, with stage 1 through stage 4 chronic kidney disease, or unspecified chronic kidney disease: Secondary | ICD-10-CM | POA: Diagnosis not present

## 2021-06-18 DIAGNOSIS — E1122 Type 2 diabetes mellitus with diabetic chronic kidney disease: Secondary | ICD-10-CM | POA: Diagnosis not present

## 2021-06-18 DIAGNOSIS — Z6834 Body mass index (BMI) 34.0-34.9, adult: Secondary | ICD-10-CM

## 2021-06-18 DIAGNOSIS — E6609 Other obesity due to excess calories: Secondary | ICD-10-CM

## 2021-06-18 MED ORDER — OZEMPIC (0.25 OR 0.5 MG/DOSE) 2 MG/1.5ML ~~LOC~~ SOPN: PEN_INJECTOR | SUBCUTANEOUS | 3 refills | Status: DC

## 2021-06-18 MED ORDER — DEXLANSOPRAZOLE 60 MG PO CPDR: 60.0000 mg | DELAYED_RELEASE_CAPSULE | Freq: Every day | ORAL | 1 refills | Status: DC

## 2021-06-18 NOTE — Progress Notes (Signed)
?Rich Brave Llittleton,acting as a Education administrator for [name], MD.,have documented all relevant documentation on the behalf of [name], MD,as directed by  [name], MD while in the presence of [name], MD.  ?This visit occurred during the SARS-CoV-2 public health emergency.  Safety protocols were in place, including screening questions prior to the visit, additional usage of staff PPE, and extensive cleaning of exam room while observing appropriate contact time as indicated for disinfecting solutions. ? ?Subjective:  ?  ? Patient ID: Dawn Wood , female    DOB: 1945-08-04 , 76 y.o.   MRN: [id number] ? ? ?Chief Complaint  ?Patient presents with  ? Diabetes  ? ? ?HPI ? ?Patient is here today for diabetes/BP check. Reports compliance with meds. She has no new concerns.  ? ?Diabetes ?She presents for her follow-up diabetic visit. She has type 2 diabetes mellitus. Her disease course has been stable. There are no hypoglycemic associated symptoms. Pertinent negatives for diabetes include no blurred vision, no polydipsia, no polyphagia and no polyuria. There are no hypoglycemic complications. Diabetic complications include nephropathy. Risk factors for coronary artery disease include diabetes mellitus, dyslipidemia, hypertension, obesity, sedentary lifestyle and post-menopausal. Current diabetic treatment includes insulin injections. She is following a diabetic diet. She participates in exercise intermittently. Her home blood glucose trend is fluctuating minimally. Her breakfast blood glucose is taken between 8-9 am. Her breakfast blood glucose range is generally 110-130 mg/dl. Eye exam is current.  ?Hypertension ?This is a chronic problem. The current episode started more than 1 year ago. The problem has been gradually improving since onset. Pertinent negatives include no blurred vision. Risk factors for coronary artery disease include diabetes mellitus, dyslipidemia, sedentary lifestyle  and post-menopausal state. Hypertensive end-organ damage includes kidney disease.   ? ?Past Medical History:  ?Diagnosis Date  ? Coronary artery disease   ? Diabetes mellitus without complication ([place])   ? GERD (gastroesophageal reflux disease)   ? Hypertension   ? MI (myocardial infarction) ([place])   ? Peripheral vascular disease ([place])   ?  ? ?Family History  ?Problem Relation Age of Onset  ? Aortic stenosis Daughter   ? Heart disease Daughter   ?     before age 76  ? Hypertension Mother   ? Cancer Father   ? Hypertension Father   ? Thyroid disease Daughter   ? Breast cancer Neg Hx   ? ? ? ?Current Outpatient Medications:  ?  Alirocumab (PRALUENT) 75 MG/ML SOAJ, Inject 75 mg into the skin every 14 (fourteen) days., Disp: 2 mL, Rfl: 11 ?  amLODipine (NORVASC) 5 MG tablet, Take 1 tablet (5 mg total) by mouth daily., Disp: 90 tablet, Rfl: 3 ?  BD PEN NEEDLE NANO 2ND GEN 32G X 4 MM MISC, USE AS DIRECTED TWICE DAILY, Disp: 100 each, Rfl: 2 ?  Cholecalciferol (VITAMIN D3) 2000 units TABS, Take 1 tablet by mouth daily., Disp: , Rfl:  ?  clopidogrel (PLAVIX) 75 MG tablet, Take 1 tablet (75 mg total) by mouth daily., Disp: 90 tablet, Rfl: 3 ?  Coenzyme Q10 (CO Q 10) 10 MG CAPS, Take by mouth daily., Disp: , Rfl:  ?  colchicine 0.6 MG tablet, Take 1 tablet by mouth daily. If you experience a flare, increase to 2 tablets by mouth daily., Disp: 60 tablet, Rfl: 2 ?  fluticasone (FLONASE) 50 MCG/ACT nasal spray, Place 1 spray into both nostrils daily as needed for allergies or rhinitis., Disp: 16 g,  Rfl: 2 ?  meclizine (ANTIVERT) 12.5 MG tablet, Take 1 tablet (12.5 mg total) by mouth 3 (three) times daily as needed for dizziness., Disp: 30 tablet, Rfl: 0 ?  metoprolol succinate (TOPROL-XL) 100 MG 24 hr tablet, TAKE 1 TABLET BY MOUTH EVERY DAY FOLLOWING MEALS, Disp: 90 tablet, Rfl: 1 ?  nitroGLYCERIN (NITROSTAT) 0.4 MG SL tablet, Place 1 tablet (0.4 mg total) under the tongue every 5 (five) minutes as needed for chest pain., Disp:  25 tablet, Rfl: 3 ?  NOVOLOG MIX 70/30 FLEXPEN (70-30) 100 UNIT/ML FlexPen, INJECT 40 UNITS SUBCUTANEOUS EVERY MORNING AND 45 UNITS EVERY NIGHT AT BEDTIME, Disp: 75 mL, Rfl: 2 ?  olmesartan (BENICAR) 40 MG tablet, TAKE 1 TABLET(40 MG) BY MOUTH DAILY, Disp: 90 tablet, Rfl: 2 ?  ONETOUCH VERIO test strip, USE TO CHECK BLOOD SUGARS TWICE DAILY AS DIRECTED, Disp: 100 strip, Rfl: 3 ?  spironolactone-hydrochlorothiazide (ALDACTAZIDE) 25-25 MG tablet, Take 1 tablet by mouth daily., Disp: 90 tablet, Rfl: 3 ?  dexlansoprazole (DEXILANT) 60 MG capsule, Take 1 capsule (60 mg total) by mouth daily., Disp: 30 capsule, Rfl: 1 ?  lidocaine-prilocaine (EMLA) cream, , Disp: , Rfl:  ?  Semaglutide,0.25 or 0.5MG/DOS, (OZEMPIC, 0.25 OR 0.5 MG/DOSE,) 2 MG/1.5ML SOPN, INJECT 0.5MG INTO SKIN ONCE A WEEK, Disp: 4.5 mL, Rfl: 3  ? ?Allergies  ?Allergen Reactions  ? Dust Mite Extract Other (See Comments)  ?  REACTION: SNEEZING  ? Other Other (See Comments)  ?  ALLERGEN: DEODORIZERS SPRAY AND PERFUMES ?REACTION: SNEEZING  ? Statins Other (See Comments)  ?  Muscle aches with rosuvastatin 5 mg daily and 3x/weekly, atorvastatin 20 mg daily and 3x/week, simvastatin 40 mg daily ?  ? Allopurinol   ?  Diarrhea and the pt said it made her sugars low  ? Pollen Extract Other (See Comments)  ?  Reaction unknown  ?  ? ?Review of Systems  ?Constitutional: Negative.   ?Eyes:  Negative for blurred vision.  ?Respiratory: Negative.    ?Cardiovascular: Negative.   ?Endocrine: Negative for polydipsia, polyphagia and polyuria.  ?Neurological: Negative.   ?Psychiatric/Behavioral: Negative.     ? ?Today's Vitals  ? 06/18/21 1455  ?BP: 112/80  ?Pulse: 60  ?Temp: 98.6 ?F (37 ?C)  ?Weight: 208 lb 9.6 oz (94.6 kg)  ?Height: 5' 5" (1.651 m)  ?PainSc: 0-No pain  ? ?Body mass index is 34.71 kg/m?.  ?Wt Readings from Last 3 Encounters:  ?06/18/21 208 lb 9.6 oz (94.6 kg)  ?05/28/21 207 lb 9.6 oz (94.2 kg)  ?04/06/21 204 lb 9.6 oz (92.8 kg)  ?  ?BP Readings from Last 3  Encounters:  ?06/18/21 112/80  ?05/28/21 136/76  ?04/06/21 130/84  ? ?Objective:  ?Physical Exam ?Vitals and nursing note reviewed.  ?Constitutional:   ?   Appearance: Normal appearance.  ?HENT:  ?   Head: Normocephalic and atraumatic.  ?Eyes:  ?   Extraocular Movements: Extraocular movements intact.  ?Cardiovascular:  ?   Rate and Rhythm: Normal rate and regular rhythm.  ?   Heart sounds: Normal heart sounds.  ?Pulmonary:  ?   Effort: Pulmonary effort is normal.  ?   Breath sounds: Normal breath sounds.  ?Abdominal:  ?   Comments: Abdominal adiposity  ?Skin: ?   General: Skin is warm.  ?Neurological:  ?   General: No focal deficit present.  ?   Mental Status: She is alert.  ?Psychiatric:     ?   Mood and Affect: Mood normal.     ?  Behavior: Behavior normal.  ?  ? ?   ?Assessment And Plan:  ?   ?1. Type 2 diabetes mellitus with stage 3a chronic kidney disease, with long-term current use of insulin ([place]) ?Comments: Chronic, I will check labs as below. She is encouraged to avoid diet drinks. I will request her most recent eye exam from her ophthalmologist. ?- Semaglutide,0.25 or 0.5MG/DOS, (OZEMPIC, 0.25 OR 0.5 MG/DOSE,) 2 MG/1.5ML SOPN; INJECT 0.5MG INTO SKIN ONCE A WEEK  Dispense: 4.5 mL; Refill: 3 ?- [id number]+EGFR ?- Protein electrophoresis, serum ?- Parathyroid Hormone, Intact w/Ca ?- Phosphorus ?- Hemoglobin A1c ?- AMB Referral to [place] ? ?2. Hypertensive heart and renal disease with renal failure, stage 1 through stage 4 or unspecified chronic kidney disease, without heart failure ?Comments: Chronic, well controlled. She is encouraged to follow a heart healthy lifestyle and to exercise outside of work.  ?- AMB Referral to [place] ? ?3. CAD in native artery ?Comments: Chronic, s/p stent placement. She is on ASA and statin therapy.  She agrees to CCM referral/pharmacist eval.  ?- AMB Referral to [place] ? ?4. Class 1 obesity due to excess calories with  serious comorbidity and body mass index (BMI) of 34.0 to 34.9 in adult ?Comments: She is encouraged to gradually increase exercise to 30 minutes five days per week, while striving for BMI<30 to decrease cardiac

## 2021-06-18 NOTE — Patient Instructions (Signed)

## 2021-06-19 ENCOUNTER — Other Ambulatory Visit: Payer: Self-pay

## 2021-06-19 NOTE — Telephone Encounter (Signed)
Prior auth done for Dexilant waiting on a response from the pt's insurance company ?

## 2021-06-22 ENCOUNTER — Telehealth: Payer: Self-pay

## 2021-06-22 DIAGNOSIS — E1122 Type 2 diabetes mellitus with diabetic chronic kidney disease: Secondary | ICD-10-CM

## 2021-06-22 LAB — PTH, INTACT AND CALCIUM: PTH: 52 pg/mL (ref 15–65)

## 2021-06-22 LAB — PROTEIN ELECTROPHORESIS, SERUM
A/G Ratio: 1.1 (ref 0.7–1.7)
Albumin ELP: 3.8 g/dL (ref 2.9–4.4)
Alpha 1: 0.2 g/dL (ref 0.0–0.4)
Alpha 2: 0.8 g/dL (ref 0.4–1.0)
Beta: 1.2 g/dL (ref 0.7–1.3)
Gamma Globulin: 1.2 g/dL (ref 0.4–1.8)
Globulin, Total: 3.5 g/dL (ref 2.2–3.9)

## 2021-06-22 LAB — CMP14+EGFR
ALT: 12 IU/L (ref 0–32)
AST: 16 IU/L (ref 0–40)
Albumin/Globulin Ratio: 1.5 (ref 1.2–2.2)
Albumin: 4.4 g/dL (ref 3.7–4.7)
Alkaline Phosphatase: 82 IU/L (ref 44–121)
BUN/Creatinine Ratio: 18 (ref 12–28)
BUN: 19 mg/dL (ref 8–27)
Bilirubin Total: 0.5 mg/dL (ref 0.0–1.2)
CO2: 25 mmol/L (ref 20–29)
Calcium: 9.4 mg/dL (ref 8.7–10.3)
Chloride: 104 mmol/L (ref 96–106)
Creatinine, Ser: 1.06 mg/dL — ABNORMAL HIGH (ref 0.57–1.00)
Globulin, Total: 2.9 g/dL (ref 1.5–4.5)
Glucose: 51 mg/dL — ABNORMAL LOW (ref 70–99)
Potassium: 4 mmol/L (ref 3.5–5.2)
Sodium: 143 mmol/L (ref 134–144)
Total Protein: 7.3 g/dL (ref 6.0–8.5)
eGFR: 54 mL/min/{1.73_m2} — ABNORMAL LOW (ref >59)

## 2021-06-22 LAB — PHOSPHORUS: Phosphorus: 3 mg/dL (ref 3.0–4.3)

## 2021-06-22 LAB — HEMOGLOBIN A1C
Est. average glucose Bld gHb Est-mCnc: 148 mg/dL
Hgb A1c MFr Bld: 6.8 % — ABNORMAL HIGH (ref 4.8–5.6)

## 2021-06-22 MED ORDER — DEXLANSOPRAZOLE 60 MG PO CPDR: 60.0000 mg | DELAYED_RELEASE_CAPSULE | Freq: Every day | ORAL | 1 refills | Status: DC

## 2021-06-22 MED ORDER — OZEMPIC (0.25 OR 0.5 MG/DOSE) 2 MG/1.5ML ~~LOC~~ SOPN: PEN_INJECTOR | SUBCUTANEOUS | 3 refills | Status: AC

## 2021-06-22 NOTE — Telephone Encounter (Signed)
The pt was notified that her generic dexliant was denied for coverage by her insurance company. ?

## 2021-06-24 ENCOUNTER — Ambulatory Visit (INDEPENDENT_AMBULATORY_CARE_PROVIDER_SITE_OTHER): Payer: BC Managed Care – PPO | Admitting: Nurse Practitioner

## 2021-06-24 ENCOUNTER — Encounter: Payer: Self-pay | Admitting: Nurse Practitioner

## 2021-06-24 ENCOUNTER — Ambulatory Visit: Payer: Medicare Other | Admitting: Internal Medicine

## 2021-06-24 VITALS — BP 130/72 | HR 68 | Temp 98.9°F | Ht 65.0 in | Wt 210.4 lb

## 2021-06-24 DIAGNOSIS — R1030 Lower abdominal pain, unspecified: Secondary | ICD-10-CM

## 2021-06-24 DIAGNOSIS — R350 Frequency of micturition: Secondary | ICD-10-CM

## 2021-06-24 MED ORDER — NITROFURANTOIN MONOHYD MACRO 100 MG PO CAPS: 100.0000 mg | ORAL_CAPSULE | Freq: Two times a day (BID) | ORAL | 0 refills | Status: AC

## 2021-06-24 NOTE — Patient Instructions (Signed)

## 2021-06-24 NOTE — Progress Notes (Signed)
?Industrial/product designer as a Education administrator for [employer], FNP.,have documented all relevant documentation on the behalf of Minette Brine, FNP,as directed by  Minette Brine, FNP while in the presence of Minette Brine, [employer]. ? ?This visit occurred during the SARS-CoV-2 public health emergency.  Safety protocols were in place, including screening questions prior to the visit, additional usage of staff PPE, and extensive cleaning of exam room while observing appropriate contact time as indicated for disinfecting solutions. ? ?Subjective:  ?  ? Patient ID: Dawn Wood , female    DOB: Jun 23, 1945 , 76 y.o.   MRN: [id number] ? ? ?Chief Complaint  ?Patient presents with  ? Urinary Frequency  ? ? ?HPI ? ?Patient presents today for treatment for abdominal pain and urinary frequency. Reports pain to her groin when she walks and unable to put pressure on her feet. She works in housekeeping at [employer]. She has taken tylenol with relief sometimes ? ?Urinary Frequency  ?This is a new problem. The current episode started in the past 7 days. The problem occurs every urination. The problem has been unchanged. She is Not sexually active. There is No history of pyelonephritis. Associated symptoms include frequency. Associated symptoms comments: Urine has an odor.   ? ?Past Medical History:  ?Diagnosis Date  ? Coronary artery disease   ? Diabetes mellitus without complication ([place])   ? GERD (gastroesophageal reflux disease)   ? Hypertension   ? MI (myocardial infarction) ([place])   ? Peripheral vascular disease ([place])   ?  ? ?Family History  ?Problem Relation Age of Onset  ? Hypertension Mother   ? Cancer Father   ? Hypertension Father   ? Breast cancer Sister 17  ? Aortic stenosis Daughter   ? Heart disease Daughter   ?     before age 77  ? Thyroid disease Daughter   ? ? ? ?Current Outpatient Medications:  ?  Alirocumab (PRALUENT) 75 MG/ML SOAJ, Inject 75 mg into the skin every 14 (fourteen) days., Disp: 2 mL, Rfl: 11 ?  amLODipine  (NORVASC) 5 MG tablet, Take 1 tablet (5 mg total) by mouth daily., Disp: 90 tablet, Rfl: 3 ?  BD PEN NEEDLE NANO 2ND GEN 32G X 4 MM MISC, USE AS DIRECTED TWICE DAILY, Disp: 100 each, Rfl: 2 ?  Cholecalciferol (VITAMIN D3) 2000 units TABS, Take 1 tablet by mouth daily., Disp: , Rfl:  ?  clopidogrel (PLAVIX) 75 MG tablet, Take 1 tablet (75 mg total) by mouth daily., Disp: 90 tablet, Rfl: 3 ?  Coenzyme Q10 (CO Q 10) 10 MG CAPS, Take by mouth daily., Disp: , Rfl:  ?  colchicine 0.6 MG tablet, Take 1 tablet by mouth daily. If you experience a flare, increase to 2 tablets by mouth daily., Disp: 60 tablet, Rfl: 2 ?  dexlansoprazole (DEXILANT) 60 MG capsule, Take 1 capsule (60 mg total) by mouth daily., Disp: 30 capsule, Rfl: 1 ?  fluticasone (FLONASE) 50 MCG/ACT nasal spray, Place 1 spray into both nostrils daily as needed for allergies or rhinitis., Disp: 16 g, Rfl: 2 ?  lidocaine-prilocaine (EMLA) cream, , Disp: , Rfl:  ?  meclizine (ANTIVERT) 12.5 MG tablet, Take 1 tablet (12.5 mg total) by mouth 3 (three) times daily as needed for dizziness., Disp: 30 tablet, Rfl: 0 ?  metoprolol succinate (TOPROL-XL) 100 MG 24 hr tablet, TAKE 1 TABLET BY MOUTH EVERY DAY FOLLOWING MEALS, Disp: 90 tablet, Rfl: 1 ?  nitroGLYCERIN (NITROSTAT) 0.4 MG SL tablet, Place 1 tablet (0.4  mg total) under the tongue every 5 (five) minutes as needed for chest pain., Disp: 25 tablet, Rfl: 3 ?  NOVOLOG MIX 70/30 FLEXPEN (70-30) 100 UNIT/ML FlexPen, INJECT 40 UNITS SUBCUTANEOUS EVERY MORNING AND 45 UNITS EVERY NIGHT AT BEDTIME, Disp: 75 mL, Rfl: 2 ?  olmesartan (BENICAR) 40 MG tablet, TAKE 1 TABLET(40 MG) BY MOUTH DAILY, Disp: 90 tablet, Rfl: 2 ?  ONETOUCH VERIO test strip, USE TO CHECK BLOOD SUGARS TWICE DAILY AS DIRECTED, Disp: 100 strip, Rfl: 3 ?  Semaglutide,0.25 or 0.5MG /DOS, (OZEMPIC, 0.25 OR 0.5 MG/DOSE,) 2 MG/1.5ML SOPN, INJECT 0.5MG  INTO SKIN ONCE A WEEK, Disp: 4.5 mL, Rfl: 3 ?  spironolactone-hydrochlorothiazide (ALDACTAZIDE) 25-25 MG tablet,  Take 1 tablet by mouth daily., Disp: 90 tablet, Rfl: 3  ? ?Allergies  ?Allergen Reactions  ? Dust Mite Extract Other (See Comments)  ?  REACTION: SNEEZING  ? Other Other (See Comments)  ?  ALLERGEN: DEODORIZERS SPRAY AND PERFUMES ?REACTION: SNEEZING  ? Statins Other (See Comments)  ?  Muscle aches with rosuvastatin 5 mg daily and 3x/weekly, atorvastatin 20 mg daily and 3x/week, simvastatin 40 mg daily ?  ? Allopurinol   ?  Diarrhea and the pt said it made her sugars low  ? Pollen Extract Other (See Comments)  ?  Reaction unknown  ?  ? ?Review of Systems  ?Constitutional: Negative.   ?Respiratory: Negative.    ?Cardiovascular: Negative.   ?Gastrointestinal:  Positive for abdominal pain (right lateral side of abdomen).  ?Genitourinary:  Positive for frequency.  ?     Nocturia  ?Neurological: Negative.   ?Psychiatric/Behavioral: Negative.     ? ?Today's Vitals  ? 06/24/21 1537  ?BP: 130/72  ?Pulse: 68  ?Temp: 98.9 ?F (37.2 ?C)  ?TempSrc: Oral  ?Weight: 210 lb 6.4 oz (95.4 kg)  ?Height: 5\' 5"  (1.651 m)  ?PainSc: 8   ? ?Body mass index is 35.01 kg/m?.  ? ?Objective:  ?Physical Exam ?Vitals reviewed.  ?Constitutional:   ?   General: She is not in acute distress. ?   Appearance: Normal appearance.  ?Cardiovascular:  ?   Rate and Rhythm: Normal rate and regular rhythm.  ?   Pulses: Normal pulses.  ?   Heart sounds: Normal heart sounds. No murmur heard. ?Pulmonary:  ?   Effort: Pulmonary effort is normal. No respiratory distress.  ?   Breath sounds: Normal breath sounds. No wheezing.  ?Musculoskeletal:     ?   General: Tenderness (bilateral groin area) present.  ?Skin: ?   General: Skin is warm and dry.  ?Neurological:  ?   General: No focal deficit present.  ?   Mental Status: She is alert and oriented to person, place, and time.  ?   Cranial Nerves: No cranial nerve deficit.  ?   Motor: No weakness.  ?Psychiatric:     ?   Mood and Affect: Mood normal.     ?   Behavior: Behavior normal.     ?   Thought Content: Thought  content normal.     ?   Judgment: Judgment normal.  ?  ? ?   ?Assessment And Plan:  ?   ?1. Urinary frequency ?- Culture, Urine ?- nitrofurantoin, macrocrystal-monohydrate, (MACROBID) 100 MG capsule; Take 1 capsule (100 mg total) by mouth 2 (two) times daily for 5 days.  Dispense: 10 capsule; Refill: 0 ? ?2. Lower abdominal pain ?Comments: Tenderness to bilateral groin areas, negative for lymphadenopathy.  ?- Culture, Urine ?- nitrofurantoin, macrocrystal-monohydrate, (MACROBID) 100  MG capsule; Take 1 capsule (100 mg total) by mouth 2 (two) times daily for 5 days.  Dispense: 10 capsule; Refill: 0 ?  ? ? ?Patient was given opportunity to ask questions. Patient verbalized understanding of the plan and was able to repeat key elements of the plan. All questions were answered to their satisfaction.  ?Minette Brine, FNP  ? ?I, Minette Brine, FNP, have reviewed all documentation for this visit. The documentation on 06/24/21 for the exam, diagnosis, procedures, and orders are all accurate and complete.  ? ?IF YOU HAVE BEEN REFERRED TO A SPECIALIST, IT MAY TAKE 1-2 WEEKS TO SCHEDULE/PROCESS THE REFERRAL. IF YOU HAVE NOT HEARD FROM US/SPECIALIST IN TWO WEEKS, PLEASE GIVE [place] A CALL AT [phone] X [phone].  ? ?THE PATIENT IS ENCOURAGED TO PRACTICE SOCIAL DISTANCING DUE TO THE COVID-19 PANDEMIC.   ?

## 2021-06-26 ENCOUNTER — Telehealth: Payer: Self-pay | Admitting: *Deleted

## 2021-06-26 NOTE — Chronic Care Management (AMB) (Signed)
?  Care Management  ? ?Note ? ?06/26/2021 ?Name: Dawn Wood MRN: [id number] DOB: 02/02/1946 ? ?Dawn Wood is a 76 y.o. year old female who is a primary care patient of Dorothyann Peng, MD. I reached out to [employer] by phone today offer care coordination services.  ? ?Dawn Wood was given information about care management services today including:  ?Care management services include personalized support from designated clinical staff supervised by her physician, including individualized plan of care and coordination with other care providers ?24/7 contact phone numbers for assistance for urgent and routine care needs. ?The patient may stop care management services at any time by phone call to the office staff. ? ?Patient agreed to services and verbal consent obtained.  ? ?Follow up plan: ?Telephone appointment with care management team member scheduled for:08/11/21 ? ?Dawn Wood  ?Care Guide, Embedded Care Coordination ?[facility]  Care Management  ?Direct Dial: [phone] ? ?

## 2021-07-07 ENCOUNTER — Encounter: Payer: Self-pay | Admitting: Internal Medicine

## 2021-07-10 ENCOUNTER — Other Ambulatory Visit: Payer: Self-pay | Admitting: Internal Medicine

## 2021-07-10 DIAGNOSIS — Z1231 Encounter for screening mammogram for malignant neoplasm of breast: Secondary | ICD-10-CM

## 2021-07-16 ENCOUNTER — Ambulatory Visit
Admission: RE | Admit: 2021-07-16 | Discharge: 2021-07-16 | Disposition: A | Discharge status: Home / Self Care | Payer: BC Managed Care – PPO | Source: Ambulatory Visit | Attending: Internal Medicine | Admitting: Internal Medicine

## 2021-07-16 DIAGNOSIS — Z1231 Encounter for screening mammogram for malignant neoplasm of breast: Secondary | ICD-10-CM

## 2021-07-20 ENCOUNTER — Other Ambulatory Visit: Payer: Self-pay | Admitting: Internal Medicine

## 2021-07-20 DIAGNOSIS — R928 Other abnormal and inconclusive findings on diagnostic imaging of breast: Secondary | ICD-10-CM

## 2021-07-29 ENCOUNTER — Ambulatory Visit
Admission: RE | Admit: 2021-07-29 | Discharge: 2021-07-29 | Disposition: A | Discharge status: Home / Self Care | Payer: BC Managed Care – PPO | Source: Ambulatory Visit | Attending: Internal Medicine | Admitting: Internal Medicine

## 2021-07-29 DIAGNOSIS — R928 Other abnormal and inconclusive findings on diagnostic imaging of breast: Secondary | ICD-10-CM

## 2021-07-30 ENCOUNTER — Ambulatory Visit (INDEPENDENT_AMBULATORY_CARE_PROVIDER_SITE_OTHER): Payer: BC Managed Care – PPO | Admitting: Neurology

## 2021-07-30 ENCOUNTER — Encounter: Payer: Self-pay | Admitting: Neurology

## 2021-07-30 VITALS — BP 117/67 | HR 60 | Ht 65.0 in | Wt 211.2 lb

## 2021-07-30 DIAGNOSIS — G4719 Other hypersomnia: Secondary | ICD-10-CM | POA: Diagnosis not present

## 2021-07-30 DIAGNOSIS — R635 Abnormal weight gain: Secondary | ICD-10-CM | POA: Diagnosis not present

## 2021-07-30 DIAGNOSIS — G4733 Obstructive sleep apnea (adult) (pediatric): Secondary | ICD-10-CM | POA: Diagnosis not present

## 2021-07-30 DIAGNOSIS — I2510 Atherosclerotic heart disease of native coronary artery without angina pectoris: Secondary | ICD-10-CM

## 2021-07-30 NOTE — Patient Instructions (Signed)
It was nice to see you again today.  As discussed, we will pursue AutoPap therapy at home for your sleep apnea.  I am hoping that we can use the sleep study from 2 years ago.  Some insurances require testing again, we will then consider a home sleep test if need be.  Please let us know about your new insurance.  You can call us or email [place] through MyChart.  Once you have a new machine for sleep apnea treatment, we will need to set up an appointment for follow-up within 1 to 3 months.  Congratulations on your retirement!

## 2021-07-30 NOTE — Progress Notes (Signed)
Subjective:    Patient ID: Dawn Wood is a 76 y.o. female.  HPI    Interim history:   Dawn Wood is a 76 year old right-handed woman with an underlying medical history of back pain, gout, coronary artery disease with history of MI, hypertension, peripheral vascular disease, reflux disease, diabetes and obesity, who presents for follow up consultation of her sleep disorder. The patient is unaccompanied today and presents after over 2 years. I last saw her on 06/05/19, at which time we talked about her sleep study results from 05/18/2019.  Overall AHI was 13.1/h, REM AHI 30.4/h, supine AHI 17.9/h, O2 nadir 81%.  She was encouraged to consider CPAP or AutoPap therapy.  I had encouraged her to return for proper titration study.  She declined this.  She also declined starting AutoPap therapy.  We also talked about alternative treatment options.  We talked about a referral to dentistry although she had trouble tolerating partial dentures in the past and was missing several teeth.  I offered a consultation request with dentistry.  She declined a referral to dentistry.  She was working on weight loss.  Today, 07/30/2021: She report worsening daytime somnolence.  She does not wake up rested.  She does not have recurrent morning headaches but has nocturia about 3-4 times per average night.  Her sleep disruption has become worse.  She is getting ready to retire.  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  She will have an insurance change because of her retirement soon.  She would be willing to consider AutoPap therapy.  She has gained a little bit of weight since we did sleep testing 2 years ago.  She often naps after she comes home from work.  She may sleep for 2 to 3 hours and then go to bed between 9 and 10 PM.  Rise time is around 3 AM, she has to be at work at 5 AM.  Her Epworth sleepiness score is 15 out of 24, fatigue severity score is 53 out of 63.   The patient's allergies, current  medications, family history, past medical history, past social history, past surgical history and problem list were reviewed and updated as appropriate.    Previously:    I first met her on 04/02/2019 at the request of her primary care physician, at which time she reported snoring and excessive daytime somnolence.  She was advised to proceed with a sleep study.  She had a baseline sleep study on 05/18/2019.  She had a reduced percentage of REM sleep, REM latency was delayed at 251 minutes.  Overall sleep efficiency was 76.8%.  Total AHI was 13.1/h, REM AHI in the severe range at 30.4/h, supine AHI was in the moderate range at 17.9/h, average oxygen saturation was 90%, nadir was 81%, she had no significant PLM's or EKG or EEG changes.  She was advised to consider CPAP therapy.  She was encouraged to return for a proper CPAP titration study.  She was reluctant to proceed with CPAP therapy and requested a follow-up appointment.   04/02/19: (She) reports snoring and excessive daytime somnolence.  I reviewed your office note from 03/21/2019.  Her Epworth sleepiness score is 15 out of 24, fatigue severity score is 26 out of 63.  She typically takes a nap after coming home from work.  She works from 5 AM to 2 PM typically, is in housekeeping at [employer].  She is single, she lives alone, she has 2  grown daughters and 2 grown granddaughters, 3 great grandsons.  She has a nephew with sleep apnea.  She is reluctant to consider CPAP therapy if the need arises.  She has noticed lower extremity swelling, reports a history of gout but is also going to talk to her cardiologist about her amlodipine as advised by you.  She has significant nocturia about 3-4 times per average night but denies recurrent morning headaches.  She reports that she often falls asleep on the couch and eventually goes to bed around 9 or 10 PM, rise time is around 3:30 AM typically.  Her mother is 44 years old and she also checks on her.  Her  father passed away in 05/17/96.  She quit smoking about 8 years ago, she does not drink alcohol, she does not drink caffeine on a daily basis. She has a history of recurrent strep throat and tonsillitis, was supposed to get her tonsils out at [facility] but decided against it.     Her Past Medical History Is Significant For: Past Medical History:  Diagnosis Date   Coronary artery disease    Diabetes mellitus without complication ([facility])    GERD (gastroesophageal reflux disease)    Hypertension    MI (myocardial infarction) ([place])    Peripheral vascular disease ([place])     Her Past Surgical History Is Significant For: Past Surgical History:  Procedure Laterality Date   ABDOMINAL HYSTERECTOMY  1985   APPENDECTOMY     CATARACT EXTRACTION Left 02/20/2018   Dr. Venetia Maxon   CORONARY STENT PLACEMENT  05-17-13   ectopic pregnancy--unilateral salpingectomy  05/17/1978   KNEE ARTHROSCOPY Left 03/2016   unilateral oophorectomy      Her Family History Is Significant For: Family History  Problem Relation Age of Onset   Hypertension Mother    Cancer Father    Hypertension Father    Breast cancer Sister 73   Aortic stenosis Daughter    Heart disease Daughter        before age 18   Thyroid disease Daughter    Sleep apnea Neg Hx     Her Social History Is Significant For: Social History   Socioeconomic History   Marital status: Single    Spouse name: Not on file   Number of children: Not on file   Years of education: Not on file   Highest education level: Not on file  Occupational History   Not on file  Tobacco Use   Smoking status: Former    Packs/day: 0.25    Years: 45.00    Pack years: 11.25    Types: Cigarettes    Quit date: 01/10/2013    Years since quitting: 8.5   Smokeless tobacco: Never  Vaping Use   Vaping Use: Never used  Substance and Sexual Activity   Alcohol use: Not Currently    Alcohol/week: 0.0 standard drinks   Drug use: No   Sexual activity: Not on file  Other Topics Concern    Not on file  Social History Narrative   Not on file   Social Determinants of Health   Financial Resource Strain: Not on file  Food Insecurity: Not on file  Transportation Needs: Not on file  Physical Activity: Not on file  Stress: Not on file  Social Connections: Not on file    Her Allergies Are:  Allergies  Allergen Reactions   Dust Mite Extract Other (See Comments)    REACTION: SNEEZING   Other Other (See Comments)  ALLERGEN: DEODORIZERS SPRAY AND PERFUMES REACTION: SNEEZING   Statins Other (See Comments)    Muscle aches with rosuvastatin 5 mg daily and 3x/weekly, atorvastatin 20 mg daily and 3x/week, simvastatin 40 mg daily    Allopurinol     Diarrhea and the pt said it made her sugars low   Pollen Extract Other (See Comments)    Reaction unknown  :   Her Current Medications Are:  Outpatient Encounter Medications as of 07/30/2021  Medication Sig   Alirocumab (PRALUENT) 75 MG/ML SOAJ Inject 75 mg into the skin every 14 (fourteen) days.   amLODipine (NORVASC) 5 MG tablet Take 1 tablet (5 mg total) by mouth daily.   BD PEN NEEDLE NANO 2ND GEN 32G X 4 MM MISC USE AS DIRECTED TWICE DAILY   Cholecalciferol (VITAMIN D3) 2000 units TABS Take 1 tablet by mouth daily.   clopidogrel (PLAVIX) 75 MG tablet Take 1 tablet (75 mg total) by mouth daily.   Coenzyme Q10 (CO Q 10) 10 MG CAPS Take by mouth daily.   colchicine 0.6 MG tablet Take 1 tablet by mouth daily. If you experience a flare, increase to 2 tablets by mouth daily.   fluticasone (FLONASE) 50 MCG/ACT nasal spray Place 1 spray into both nostrils daily as needed for allergies or rhinitis.   lidocaine-prilocaine (EMLA) cream    metoprolol succinate (TOPROL-XL) 100 MG 24 hr tablet TAKE 1 TABLET BY MOUTH EVERY DAY FOLLOWING MEALS   nitroGLYCERIN (NITROSTAT) 0.4 MG SL tablet Place 1 tablet (0.4 mg total) under the tongue every 5 (five) minutes as needed for chest pain.   NOVOLOG MIX 70/30 FLEXPEN (70-30) 100 UNIT/ML FlexPen  INJECT 40 UNITS SUBCUTANEOUS EVERY MORNING AND 45 UNITS EVERY NIGHT AT BEDTIME   olmesartan (BENICAR) 40 MG tablet TAKE 1 TABLET(40 MG) BY MOUTH DAILY   ONETOUCH VERIO test strip USE TO CHECK BLOOD SUGARS TWICE DAILY AS DIRECTED   Semaglutide,0.25 or 0.5MG/DOS, (OZEMPIC, 0.25 OR 0.5 MG/DOSE,) 2 MG/1.5ML SOPN INJECT 0.5MG INTO SKIN ONCE A WEEK   spironolactone-hydrochlorothiazide (ALDACTAZIDE) 25-25 MG tablet Take 1 tablet by mouth daily.   dexlansoprazole (DEXILANT) 60 MG capsule Take 1 capsule (60 mg total) by mouth daily.   meclizine (ANTIVERT) 12.5 MG tablet Take 1 tablet (12.5 mg total) by mouth 3 (three) times daily as needed for dizziness.   No facility-administered encounter medications on file as of 07/30/2021.  :  Review of Systems:  Out of a complete 14 point review of systems, all are reviewed and negative with the exception of these symptoms as listed below:  Review of Systems  Neurological:        Pt is here for sleep consult  pt states she snores,fatigue ,hypertension . Pt denies headaches  . Pt states she had sleep study 2 years ago . Pt  doesn't have CPAP machine . Pt wants to discuss other treatments for sleep apnea   ESS FSS    Objective:  Neurological Exam  Physical Exam Physical Examination:   Vitals:   07/30/21 1433  BP: 117/67  Pulse: 60    General Examination: The patient is a very pleasant 76 y.o. female in no acute distress. She appears well-developed and well-nourished and well groomed.   HEENT: Normocephalic, atraumatic, pupils are equal, round and reactive to light, extraocular tracking is good without limitation to gaze excursion or nystagmus noted. Hearing is grossly intact. Face is symmetric with normal facial animation. Speech is clear with no dysarthria noted. There is no hypophonia. There  is no lip, neck/head, jaw or voice tremor. Neck is supple with full range of passive and active motion. Airway exam reveals: mild mouth dryness, adequate dental  hygiene with several missing teeth, and moderate airway crowding. Tongue protrudes centrally and palate elevates symmetrically.   Chest: Clear to auscultation without wheezing, rhonchi or crackles noted.   Heart: S1+S2+0, regular and normal without murmurs, rubs or gallops noted.    Abdomen: Soft, non-tender and non-distended.   Extremities: There is 1 to 2+ pitting edema around the ankles.    Skin: Warm and dry without trophic changes noted.    Musculoskeletal: exam reveals no obvious joint deformities.    Neurologically:  Mental status: The patient is awake, alert and oriented in all 4 spheres. Her immediate and remote memory, attention, language skills and fund of knowledge are appropriate. There is no evidence of aphasia, agnosia, apraxia or anomia. Speech is clear with normal prosody and enunciation. Thought process is linear. Mood is normal and affect is normal.  Cranial nerves II - XII are as described above under HEENT exam.  Motor exam: Normal bulk, strength and tone is noted. There is no obvious tremor. Fine motor skills and coordination: grossly intact.  Cerebellar testing: No dysmetria or intention tremor. There is no truncal or gait ataxia.  Sensory exam: intact to light touch in the upper and lower extremities.  Gait, station and balance: She stands easily. No veering to one side is noted. No leaning to one side is noted. Posture is age-appropriate and stance is narrow based. Gait shows normal stride length and normal pace. No problems turning are noted.    Assessment and Plan:  In summary, Dawn Wood is a very pleasant 76 year old female with an underlying medical history of back pain, gout, coronary artery disease with history of MI, hypertension, peripheral vascular disease, reflux disease, diabetes and obesity, who presents for follow-up consultation of her obstructive sleep apnea to reconsider treatment options.  Her baseline sleep study from 05/18/2019 showed  overall mild obstructive sleep apnea with an AHI of 13.1/h, sleep apnea became more severe in REM sleep with a REM AHI of 30.4/h, O2 nadir was 81% during REM sleep.  Given her medical history and her worsening daytime tiredness and sleep disruption, nocturia, we mutually agreed to pursue AutoPap therapy at this time.  I am hoping that we can use the sleep study results from March 2021.  If insurance mandates a repeat sleep test, we will pursue a home sleep test.  She has had a little bit of weight gain.  She has had worsening sleepiness.  She is advised to let us know via phone call or MyChart message, once she knows her new insurance, we will then proceed with an AutoPap prescription.  She is advised to make a follow-up appointment accordingly in sleep clinic within 1 to 3 months after starting AutoPap therapy.  She is likely a good candidate for dental treatment as she reports gum disease and needing for teeth extracted.  We talked about the importance of weight management.  I answered all her questions today and she was in agreement.  I spent 30 minutes in total face-to-face time and in reviewing records during pre-charting, more than 50% of which was spent in counseling and coordination of care, reviewing test results, reviewing medications and treatment regimen and/or in discussing or reviewing the diagnosis of OSA, the prognosis and treatment options. Pertinent laboratory and imaging test results that were available during this visit with  the patient were reviewed by me and considered in my medical decision making (see chart for details).

## 2021-08-07 ENCOUNTER — Telehealth: Payer: Self-pay

## 2021-08-07 NOTE — Chronic Care Management (AMB) (Addendum)
Chronic Care Management Pharmacy Assistant   Name: Dawn Wood  MRN: [id number] DOB: 1945/09/29  Reason for Encounter: Chart review for CPP visit on 08-11-2021   Conditions to be addressed/monitored: CAD, HTN, DMII, and GERD  Recent office visits:  06-24-2021 Dawn Wood, Dawn Wood. Visit for urinary frequency. START nitrofurantoin 100 mg twice daily for 5 days.  04-06-2021 Dawn Chard, MD. Visit for dizziness. START meclizine 12.5 mg 3 times daily PRN.  02-16-2021 Dawn Chard, MD. Creatinine= 1.08, eGFR= 53. A1C= 7.2  Recent consult visits:  07-30-2021 Dawn Age, MD (Neurology). Follow up visit. No changes.  05-28-2021 Dawn Crome, MD (Cardiology). EKG and myocardial perfusion imaging completed. Stress test ordered.  Hospital visits:  None in previous 6 months  Medications: Outpatient Encounter Medications as of 08/07/2021  Medication Sig   Alirocumab (PRALUENT) 75 MG/ML SOAJ Inject 75 mg into the skin every 14 (fourteen) days.   amLODipine (NORVASC) 5 MG tablet Take 1 tablet (5 mg total) by mouth daily.   BD PEN NEEDLE NANO 2ND GEN 32G X 4 MM MISC USE AS DIRECTED TWICE DAILY   Cholecalciferol (VITAMIN D3) 2000 units TABS Take 1 tablet by mouth daily.   clopidogrel (PLAVIX) 75 MG tablet Take 1 tablet (75 mg total) by mouth daily.   Coenzyme Q10 (CO Q 10) 10 MG CAPS Take by mouth daily.   colchicine 0.6 MG tablet Take 1 tablet by mouth daily. If you experience a flare, increase to 2 tablets by mouth daily.   dexlansoprazole (DEXILANT) 60 MG capsule Take 1 capsule (60 mg total) by mouth daily.   fluticasone (FLONASE) 50 MCG/ACT nasal spray Place 1 spray into both nostrils daily as needed for allergies or rhinitis.   lidocaine-prilocaine (EMLA) cream    meclizine (ANTIVERT) 12.5 MG tablet Take 1 tablet (12.5 mg total) by mouth 3 (three) times daily as needed for dizziness.   metoprolol succinate (TOPROL-XL) 100 MG 24 hr tablet TAKE 1 TABLET BY MOUTH EVERY DAY  FOLLOWING MEALS   nitroGLYCERIN (NITROSTAT) 0.4 MG SL tablet Place 1 tablet (0.4 mg total) under the tongue every 5 (five) minutes as needed for chest pain.   NOVOLOG MIX 70/30 FLEXPEN (70-30) 100 UNIT/ML FlexPen INJECT 40 UNITS SUBCUTANEOUS EVERY MORNING AND 45 UNITS EVERY NIGHT AT BEDTIME   olmesartan (BENICAR) 40 MG tablet TAKE 1 TABLET(40 MG) BY MOUTH DAILY   ONETOUCH VERIO test strip USE TO CHECK BLOOD SUGARS TWICE DAILY AS DIRECTED   Semaglutide,0.25 or 0.5MG/DOS, (OZEMPIC, 0.25 OR 0.5 MG/DOSE,) 2 MG/1.5ML SOPN INJECT 0.5MG INTO SKIN ONCE A WEEK   spironolactone-hydrochlorothiazide (ALDACTAZIDE) 25-25 MG tablet Take 1 tablet by mouth daily.   No facility-administered encounter medications on file as of 08/07/2021.   Lab Results  Component Value Date/Time   HGBA1C 6.8 (H) 06/18/2021 03:37 PM   HGBA1C 7.2 (H) 02/16/2021 03:42 PM   HGBA1C 6.5 10/03/2017 12:00 AM   MICROALBUR 10 02/16/2021 04:56 PM   MICROALBUR 10 10/04/2019 10:57 AM     BP Readings from Last 3 Encounters:  07/30/21 117/67  06/24/21 130/72  06/18/21 112/80    Have you seen any other providers since your last visit with PCP? Yes patient saw neurology.  What is your top health concern to discuss at your upcoming visit? Patient stated she is still having pain in groin.  Do you have any problems getting your medications from the pharmacy? No  Financial barriers? No Access barriers? No  Dawn Wood was reminded to have  all medications, supplements and any blood glucose and/or blood pressure readings available for review with Dawn Wood, Pharm. D, at her telephone visit on 08-11-2021 at 9:00.  Care Gaps: Next annual wellness visit? 10-20-2021 Last Colonoscopy? None Last Mammogram? 07-16-2021 Last Bone Denisty? 01-07-2017  Last eye exam / retinopathy screening? Patient stated this year. Last diabetic foot exam? Patient stated Dr. Baird Cancer normally checks feet.  Immunizations: Covid booster  overdue  [facility] Drugs: Olmesartan 40 mg- Last filled 05-18-2021 90 DS [facility] Ozempic 0.5 mg- Last filled 08-04-2021 30 DS [facility]  [place] Clinical Pharmacist Assistant [phone]

## 2021-08-11 ENCOUNTER — Ambulatory Visit (INDEPENDENT_AMBULATORY_CARE_PROVIDER_SITE_OTHER): Payer: Medicare Other

## 2021-08-11 DIAGNOSIS — I10 Essential (primary) hypertension: Secondary | ICD-10-CM

## 2021-08-11 DIAGNOSIS — N1831 Chronic kidney disease, stage 3a: Secondary | ICD-10-CM

## 2021-08-11 DIAGNOSIS — E785 Hyperlipidemia, unspecified: Secondary | ICD-10-CM

## 2021-08-11 NOTE — Patient Instructions (Addendum)
Visit Information It was great speaking with you today!  Please let me know if you have any questions about our visit.   Goals Addressed             This Visit's Progress    Manage My Medicine       Timeframe:  Long-Range Goal Priority:  High Start Date:                             Expected End Date:                       Follow Up Date 11/24/2021    - call for medicine refill 2 or 3 days before it runs out - call if I am sick and can't take my medicine - keep a list of all the medicines I take; vitamins and herbals too - use a pillbox to sort medicine - use an alarm clock or phone to remind me to take my medicine    Why is this important?   These steps will help you keep on track with your medicines.   Notes:  -We will be able to send off your patient assistance applications for Praluent and Ozempic once you sign the applications, and bring your financial documents. j        Patient Care Plan: CCM Pharmacy Care Plan     Problem Identified: HTN, HLD, DMII      Goal: Disease Management   Note:   Current Barriers:  Unable to independently afford treatment regimen Unable to independently monitor therapeutic efficacy  Pharmacist Clinical Goal(s):  Patient will achieve adherence to monitoring guidelines and medication adherence to achieve therapeutic efficacy through collaboration with PharmD and provider.   Interventions: 1:1 collaboration with [place]endale Chard, MD regarding development and update of comprehensive plan of care as evidenced by provider attestation and co-signature Inter-disciplinary care team collaboration (see longitudinal plan of care) Comprehensive medication review performed; medication list updated in electronic medical record  Hypertension (BP goal <130/80) -Controlled -Current treatment: Metoprolol Succinate 100 mg tablet once per day Appropriate, Effective, Safe, Accessible Olmesartan 40 mg tablet once per day Appropriate, Effective, Safe,  Accessible Spironolactone-Hydrochlorothiazide 25-25 mg tablet once per day. Appropriate, Effective, Safe, Accessible -Medications previously tried: will discuss further during next visit  -Current home readings: she is not currently checking her BP at home, her BP reading is not really accurate.  -Current dietary habits: she reports that she is not doing too well with her eating habits -Current exercise habits: she is not currently exercising -Denies hypotensive/hypertensive symptoms -Educated on Daily salt intake goal < 2300 mg; -Encouraged Ms. Cates to check into OTC benefits for a BP cuff -Counseled to monitor BP at home at least once per week, document, and provide log at future appointments -Recommended to continue current medication  Hyperlipidemia: (LDL goal < 55) -Controlled -Current treatment: Praluent 75 mg/ml - inject every 14 days Appropriate, Effective, Safe, Query accessible -Current dietary patterns: will discuss further during next visit -Current exercise habits: she is not currently exercising -Educated on Cholesterol goals;  -Recommended to continue current medication   Diabetes (A1c goal <7%) -Controlled -Current medications: Ozempic 0.5 mg once per week Appropriate, Effective, Safe, Query accessible  Novolog 70/30 100 unit/ml - inject 40 units subq every morning and 45 units every night at bedtime Appropriate, Effective, Safe, Accessible -Medications previously tried: will discuss further during next visit  -Current home  glucose readings : she is checking her BS every morning when she wakes up and sometimes she checks it twice per day.  fasting glucose: 6/6 - 127, 6/5 -147, 5 - 90   post prandial glucose:6/5-135, 6/4-136  -Denies hypoglycemic/hyperglycemic symptoms -Current meal patterns: she reports that she eats dinner late, she starts eating at around lunch  - because that is around the time that she wakes up.  Encouraged Ms. Meddings to increase the amount of  water she is drinking per day. She got a 64 ounce jug of water and she is going to try use it.  She loves [employer] brand lemonade and she knows it has a lot of sugar in it -Current exercise: will discuss in further details  -Educated on A1c and blood sugar goals; Complications of diabetes including kidney damage, retinal damage, and cardiovascular disease; -Counseled to check feet daily and get yearly eye exams -Recommended to continue current medication   Patient Goals/Self-Care Activities Patient will:  - take medications as prescribed as evidenced by patient report and record review  Follow Up Plan: The patient has been provided with contact information for the care management team and has been advised to call with any health related questions or concerns.       Ms. Reuther was given information about Chronic Care Management services today including:  CCM service includes personalized support from designated clinical staff supervised by her physician, including individualized plan of care and coordination with other care providers 24/7 contact phone numbers for assistance for urgent and routine care needs. Standard insurance, coinsurance, copays and deductibles apply for chronic care management only during months in which we provide at least 20 minutes of these services. Most insurances cover these services at 100%, however patients may be responsible for any copay, coinsurance and/or deductible if applicable. This service may help you avoid the need for more expensive face-to-face services. Only one practitioner may furnish and bill the service in a calendar month. The patient may stop CCM services at any time (effective at the end of the month) by phone call to the office staff.  Patient agreed to services and verbal consent obtained.   The patient verbalized understanding of instructions, educational materials, and care plan provided today and agreed to receive a mailed copy of patient  instructions, educational materials, and care plan.   Orlando Penner, PharmD Clinical Pharmacist Practitioner  [facility] [phone]

## 2021-08-11 NOTE — Progress Notes (Signed)
Chronic Care Management Pharmacy Note  08/11/2021 Name:  Dawn Wood MRN:  [id number] DOB:  January 18, 1946  Summary: Patient reports that she received a letter from [facility] that effective July 1 they will be unable to cover Praluent.   Recommendations/Changes made from today's visit: Recommend patient assistance application for Ozempic and Praluent.  Recommended [name] contact cardiologist to discuss the cost of Praluent  Possibly enrolling in the silver sneakers program at [place] drinking more water  Plan: Collaborate with PCP team, specialist and patient to bring financial documents and sign paper work for [employer]. [name] is going to go to [employer] to enroll in silver sneakers program [name] is going to try to drink at least three bottles of water per day    Subjective: Dawn Wood is an 76 y.o. year old female who is a primary patient of Glendale Chard, MD.  The CCM team was consulted for assistance with disease management and care coordination needs.    Engaged with patient by telephone for initial visit in response to provider referral for pharmacy case management and/or care coordination services.  She just retired last month, and your official retirement date is June 1st.  When she was working she used to be there promptly at 5 am.  She is going to [place] sometime this year to visit her best friend who she has know since she was 34 years old. Praluent Injection - she received a letter from [place] stating they will not cover her medication starting next month.She currently has two pens of Pralue nt left. Next shot is next week.  We thoroughly reviewed the requirements of the patient assistance program. She had 80/20 insurance with [employer], and now it has dropped down to 70/30. She was told that she could keep her 80/20 insurance with [employer] but she would have to pay 50 dollars a month. Per [facility] as of July 1st they will not pay for  the Praluent.   Consent to Services:  The patient was given the following information about Chronic Care Management services today, agreed to services, and gave verbal consent: 1. CCM service includes personalized support from designated clinical staff supervised by the primary care provider, including individualized plan of care and coordination with other care providers 2. 24/7 contact phone numbers for assistance for urgent and routine care needs. 3. Service will only be billed when office clinical staff spend 20 minutes or more in a month to coordinate care. 4. Only one practitioner may furnish and bill the service in a calendar month. 5.The patient may stop CCM services at any time (effective at the end of the month) by phone call to the office staff. 6. The patient will be responsible for cost sharing (co-pay) of up to 20% of the service fee (after annual deductible is met). Patient agreed to services and consent obtained.  Patient Care Team: Glendale Chard, MD as PCP - General (Internal Medicine) Belva Crome, MD as PCP - Cardiology (Cardiology) Glendale Chard, MD as Attending Physician (Internal Medicine) Marylynn Renwick Asman, MD as Consulting Physician (Ophthalmology) Mayford Knife, [facility] (Pharmacist)  Recent office visits:  06-24-2021 Minette Brine, [place]. Visit for urinary frequency. START nitrofurantoin 100 mg twice daily for 5 days.   04-06-2021 Glendale Chard, MD. Visit for dizziness. START meclizine 12.5 mg 3 times daily PRN.   02-16-2021 Glendale Chard, MD. Creatinine= 1.08, eGFR= 53. A1C= 7.2   Recent consult visits:  07-30-2021 Star Age, MD (  Neurology). Follow up visit. No changes.   05-28-2021 Belva Crome, MD (Cardiology). EKG and myocardial perfusion imaging completed. Stress test ordered.   Hospital visits:  None in previous 6 months   Objective:  Lab Results  Component Value Date   CREATININE 1.06 (H) 06/18/2021   BUN 19 06/18/2021   GFR 85.00 12/02/2014    EGFR 54 (L) 06/18/2021   GFRNONAA 46 (L) 02/11/2020   GFRAA 53 (L) 02/11/2020   NA 143 06/18/2021   K 4.0 06/18/2021   CALCIUM 9.4 06/18/2021   CO2 25 06/18/2021   GLUCOSE 51 (L) 06/18/2021    Lab Results  Component Value Date/Time   HGBA1C 6.8 (H) 06/18/2021 03:37 PM   HGBA1C 7.2 (H) 02/16/2021 03:42 PM   HGBA1C 6.5 10/03/2017 12:00 AM   GFR 85.00 12/02/2014 08:56 AM   GFR 81.78 06/19/2014 03:54 PM   MICROALBUR 10 02/16/2021 04:56 PM   MICROALBUR 10 10/04/2019 10:57 AM    Last diabetic Eye exam:  Lab Results  Component Value Date/Time   HMDIABEYEEXA No Retinopathy 05/21/2021 12:00 AM    Last diabetic Foot exam: No results found for: HMDIABFOOTEX   Lab Results  Component Value Date   CHOL 109 08/18/2020   HDL 45 08/18/2020   LDLCALC 44 08/18/2020   LDLDIRECT 83 06/11/2019   TRIG 108 08/18/2020   CHOLHDL 2.4 08/18/2020       Latest Ref Rng & Units 06/18/2021    3:37 PM 02/16/2021    3:42 PM 12/18/2020    4:14 PM  Hepatic Function  Total Protein 6.0 - 8.5 g/dL 7.3   7.1   7.6    Albumin 3.7 - 4.7 g/dL 4.4   4.1     AST 0 - 40 IU/L 16   7     ALT 0 - 32 IU/L 12   16     Alk Phosphatase 44 - 121 IU/L 82   69     Total Bilirubin 0.0 - 1.2 mg/dL 0.5   0.5       Lab Results  Component Value Date/Time   TSH 1.180 10/14/2020 11:15 AM       Latest Ref Rng & Units 10/14/2020   11:15 AM 10/04/2019   11:21 AM 12/29/2017   11:52 AM  CBC  WBC 3.4 - 10.8 x10E3/uL 9.3   7.7   8.6    Hemoglobin 11.1 - 15.9 g/dL 13.2   13.5   14.0    Hematocrit 34.0 - 46.6 % 42.3   44.1   43.3    Platelets 150 - 450 x10E3/uL 265   253   290      Lab Results  Component Value Date/Time   VD25OH 43.8 10/04/2019 11:21 AM    Clinical ASCVD: Yes  The ASCVD Risk score (Arnett DK, et al., 2019) failed to calculate for the following reasons:   The patient has a prior MI or stroke diagnosis       06/18/2021    2:56 PM 12/18/2020    3:40 PM 10/14/2020   10:01 AM  Depression screen PHQ 2/9   Decreased Interest 0 0 0  Down, Depressed, Hopeless 0 1 0  PHQ - 2 Score 0 1 0  Altered sleeping 0 1 0  Tired, decreased energy 0 1 0  Change in appetite 0 0 0  Feeling bad or failure about yourself  0 0 0  Trouble concentrating 0 0 0  Moving slowly or fidgety/restless 0 0 0  Suicidal thoughts 0 0 0  PHQ-9 Score 0 3 0  Difficult doing work/chores Not difficult at all       Social History   Tobacco Use  Smoking Status Former   Packs/day: 0.25   Years: 45.00   Pack years: 11.25   Types: Cigarettes   Quit date: 01/10/2013   Years since quitting: 8.5  Smokeless Tobacco Never   BP Readings from Last 3 Encounters:  07/30/21 117/67  06/24/21 130/72  06/18/21 112/80   Pulse Readings from Last 3 Encounters:  07/30/21 60  06/24/21 68  06/18/21 60   Wt Readings from Last 3 Encounters:  07/30/21 211 lb 3.2 oz (95.8 kg)  06/24/21 210 lb 6.4 oz (95.4 kg)  06/18/21 208 lb 9.6 oz (94.6 kg)   BMI Readings from Last 3 Encounters:  07/30/21 35.15 kg/m  06/24/21 35.01 kg/m  06/18/21 34.71 kg/m    Assessment/Interventions: Review of patient past medical history, allergies, medications, health status, including review of consultants reports, laboratory and other test data, was performed as part of comprehensive evaluation and provision of chronic care management services.   SDOH:  (Social Determinants of Health) assessments and interventions performed: Yes SDOH Interventions    Flowsheet Row Most Recent Value  SDOH Interventions   Financial Strain Interventions --  [Patient assistance application to be completed]      SDOH Screenings   Alcohol Screen: Not on file  Depression (PHQ2-9): Low Risk    PHQ-2 Score: 0  Financial Resource Strain: High Risk   Difficulty of Paying Living Expenses: Very hard  Food Insecurity: No Food Insecurity   Worried About Charity fundraiser in the Last Year: Never true   Ran Out of Food in the Last Year: Never true  Housing: Not on file   Physical Activity: Not on file  Social Connections: Not on file  Stress: Not on file  Tobacco Use: Medium Risk   Smoking Tobacco Use: Former   Smokeless Tobacco Use: Never   Passive Exposure: Not on file  Transportation Needs: Not on file    [place]  Allergies  Allergen Reactions   Dust Mite Extract Other (See Comments)    REACTION: SNEEZING   Other Other (See Comments)    ALLERGEN: DEODORIZERS SPRAY AND PERFUMES REACTION: SNEEZING   Statins Other (See Comments)    Muscle aches with rosuvastatin 5 mg daily and 3x/weekly, atorvastatin 20 mg daily and 3x/week, simvastatin 40 mg daily    Allopurinol     Diarrhea and the pt said it made her sugars low   Pollen Extract Other (See Comments)    Reaction unknown    Medications Reviewed Today     Reviewed by Mayford Knife, [place] (Pharmacist) on 08/11/21 at [phone]  Med List Status: <None>   Medication Order Taking? Sig Documenting Provider Last Dose Status Informant  Alirocumab (PRALUENT) 75 MG/ML SOAJ [id number] No Inject 75 mg into the skin every 14 (fourteen) days. Belva Crome, MD Taking Active   amLODipine (NORVASC) 5 MG tablet [id number] No Take 1 tablet (5 mg total) by mouth daily. Belva Crome, MD Taking Active   BD PEN NEEDLE NANO 2ND GEN 32G X 4 MM MISC [id number] No USE AS DIRECTED TWICE DAILY Glendale Chard, MD Taking Active   Cholecalciferol (VITAMIN D3) 2000 units TABS [id number] No Take 1 tablet by mouth daily. [provider] Taking Active   clopidogrel (PLAVIX) 75 MG tablet [id number] No Take 1 tablet (75 mg total)  by mouth daily. Belva Crome, MD Taking Active   Coenzyme Q10 (CO Q 10) 10 MG CAPS [id number] No Take by mouth daily. [provider] Taking Active   colchicine 0.6 MG tablet [id number] No Take 1 tablet by mouth daily. If you experience a flare, increase to 2 tablets by mouth daily. Glendale Chard, MD Taking Active   dexlansoprazole [facility]) 60 MG capsule [id number]  Take 1 capsule  (60 mg total) by mouth daily. Glendale Chard, MD  Active   fluticasone [facility]) 50 MCG/ACT nasal spray [id number] No Place 1 spray into both nostrils daily as needed for allergies or rhinitis. Glendale Chard, MD Taking Active   lidocaine-prilocaine (EMLA) cream [id number] No  [provider] Taking Active   meclizine (ANTIVERT) 12.5 MG tablet [id number] No Take 1 tablet (12.5 mg total) by mouth 3 (three) times daily as needed for dizziness. Glendale Chard, MD Taking Active   metoprolol succinate (TOPROL-XL) 100 MG 24 hr tablet [id number] No TAKE 1 TABLET BY MOUTH EVERY DAY FOLLOWING MEALS Glendale Chard, MD Taking Active   nitroGLYCERIN (NITROSTAT) 0.4 MG SL tablet [id number] No Place 1 tablet (0.4 mg total) under the tongue every 5 (five) minutes as needed for chest pain. Belva Crome, MD Taking Active   NOVOLOG MIX 70/30 FLEXPEN (70-30) 100 UNIT/ML FlexPen [id number] No INJECT 40 UNITS SUBCUTANEOUS EVERY MORNING AND 45 UNITS EVERY NIGHT AT BEDTIME Glendale Chard, MD Taking Active   olmesartan [facility]) 40 MG tablet [id number] No TAKE 1 TABLET(40 MG) BY MOUTH DAILY Glendale Chard, MD Taking Active   [facility] VERIO test strip [id number] No USE TO CHECK BLOOD SUGARS TWICE DAILY AS DIRECTED Glendale Chard, MD Taking Active   Semaglutide,0.25 or 0.5MG/DOS, (OZEMPIC, 0.25 OR 0.5 MG/DOSE,) 2 MG/1.5ML SOPN [id number] No INJECT 0.5MG INTO SKIN ONCE A Dayna Barker, MD Taking Active   spironolactone-hydrochlorothiazide (ALDACTAZIDE) 25-25 MG tablet [id number] No Take 1 tablet by mouth daily. 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   Class 1 obesity due to excess calories with serious comorbidity and body mass index (BMI) of 34.0 to 34.9 in  adult 10/04/2019   Gout 12/26/2017   Dyslipidemia 06/19/2014   PVD (peripheral vascular disease) ([place]) 12/13/2013   CAD in native artery 07/02/2013   Essential hypertension 07/02/2013   Diabetes mellitus type II, uncontrolled 07/02/2013   GERD (gastroesophageal reflux disease) 07/02/2013    Immunization History  Administered Date(s) Administered   Fluad Quad(high Dose 65+) 04/06/2021   Alphonsa Overall (J&J) SARS-COV-2 Vaccination 05/25/2019   PFIZER(Purple Top)SARS-COV-2 Vaccination 02/21/2020   Tdap 11/04/2017    Conditions to be addressed/monitored:  Hypertension, Hyperlipidemia, and Diabetes  Care Plan : [place]  Updates made by Mayford Knife, [place] since 08/11/2021 12:00 AM     Problem: HTN, HLD      Goal: Disease Management   Note:   Current Barriers:  Unable to independently afford treatment regimen Unable to independently monitor therapeutic efficacy  Pharmacist Clinical Goal(s):  Patient will achieve adherence to monitoring guidelines and medication adherence to achieve therapeutic efficacy through collaboration with PharmD and provider.   Interventions: 1:1 collaboration with Glendale Chard, MD regarding development and update of comprehensive plan  of care as evidenced by provider attestation and co-signature Inter-disciplinary care team collaboration (see longitudinal plan of care) Comprehensive medication review performed; medication list updated in electronic medical record  Hypertension (BP goal <130/80) -Controlled -Current treatment: Metoprolol Succinate 100 mg tablet once per day Appropriate, Effective, Safe, Accessible Olmesartan 40 mg tablet once per day Appropriate, Effective, Safe, Accessible Spironolactone-Hydrochlorothiazide 25-25 mg tablet once per day. Appropriate, Effective, Safe, Accessible -Medications previously tried: will discuss further during next visit  -Current home readings: she is not currently checking her BP at home, her BP  reading is not really accurate.  -Current dietary habits: she reports that she is not doing too well with her eating habits -Current exercise habits: she is not currently exercising -Denies hypotensive/hypertensive symptoms -Educated on Daily salt intake goal < 2300 mg; -Encouraged Ms. Wesch to check into OTC benefits for a BP cuff -Counseled to monitor BP at home at least once per week, document, and provide log at future appointments -Recommended to continue current medication  Hyperlipidemia: (LDL goal < 55) -Controlled -Current treatment: Praluent 75 mg/ml - inject every 14 days Appropriate, Effective, Safe, Query accessible -Current dietary patterns: will discuss further during next visit -Current exercise habits: she is not currently exercising -Educated on Cholesterol goals;  -Recommended to continue current medication   Diabetes (A1c goal <7%) -Controlled -Current medications: Ozempic 0.5 mg once per week Appropriate, Effective, Safe, Query accessible  Novolog 70/30 100 unit/ml - inject 40 units subq every morning and 45 units every night at bedtime Appropriate, Effective, Safe, Accessible -Medications previously tried: will discuss further during next visit  -Current home glucose readings : she is checking her BS every morning when she wakes up and sometimes she checks it twice per day.  fasting glucose: 6/6 - 127, 6/5 -147, 5 - 90   post prandial glucose:6/5-135, 6/4-136  -Denies hypoglycemic/hyperglycemic symptoms -Current meal patterns: she reports that she eats dinner late, she starts eating at around lunch  - because that is around the time that she wakes up.  Encouraged Ms. Velez to increase the amount of water she is drinking per day. She got a 64 ounce jug of water and she is going to try use it.  She loves Mil[employer] brand lemonade and she knows it has a lot of sugar in it -Current exercise: will discuss in further details  -Educated on A1c and blood sugar  goals; Complications of diabetes including kidney damage, retinal damage, and cardiovascular disease; -Counseled to check feet daily and get yearly eye exams -Recommended to continue current medication   Patient Goals/Self-Care Activities Patient will:  - take medications as prescribed as evidenced by patient report and record review  Follow Up Plan: The patient has been provided with contact information for the care management team and has been advised to call with any health related questions or concerns.       Medication Assistance: None required.  Patient affirms current coverage meets needs.  Compliance/Adherence/Medication fill history: Care Gaps: COVID-19 Vaccine   Star-Rating Drugs: Ozempic 0.5 mg  Olmesartan 40 mg tablet   Patient's preferred pharmacy is:  [facility], [address] AT [address] [place] [address] Phone: [phone] Fax: [phone]  [id number] [address] [address]: [phone]: [id number]  [address] [facility] [address] Phone: [phone] Fax: [phone]  Uses pill box? Yes Pt endorses 90% compliance  We discussed: Benefits of medication synchronization, packaging and delivery as well as enhanced pharmacist oversight with [employer]. Patient decided to: Continue current medication management strategy  Care Plan and Follow Up Patient Decision:  Patient agrees to Care Plan and Follow-up.  Plan: The patient has been provided with contact information for the care management team and has been advised to call with any health related questions or concerns.   Orlando Penner, CPP, PharmD Clinical Pharmacist Practitioner [facility] [phone]

## 2021-08-12 ENCOUNTER — Telehealth: Payer: Self-pay

## 2021-08-12 NOTE — Chronic Care Management (AMB) (Signed)
    Chronic Care Management Pharmacy Assistant   Name: Dawn Wood  MRN: [id number] DOB: 1945/10/29  Reason for Encounter: Patient Assistance Coordination  Patient came into the office to sign patient assistance applications for Ozempic with [employer] and Praluent with My[employer] patient assistance program. Patient brought income information and mentioned she was approved for [employer] today but does not know her start date. Patient knows there will still be a copay for these 2 medication with new coverage, just recently retired so she would like to move forward with applications.  Received signature from Dr Allyne Gee and faxing applications today.  Medications: Outpatient Encounter Medications as of 08/12/2021  Medication Sig   Alirocumab (PRALUENT) 75 MG/ML SOAJ Inject 75 mg into the skin every 14 (fourteen) days.   amLODipine (NORVASC) 5 MG tablet Take 1 tablet (5 mg total) by mouth daily.   BD PEN NEEDLE NANO 2ND GEN 32G X 4 MM MISC USE AS DIRECTED TWICE DAILY   Cholecalciferol (VITAMIN D3) 2000 units TABS Take 1 tablet by mouth daily.   clopidogrel (PLAVIX) 75 MG tablet Take 1 tablet (75 mg total) by mouth daily.   Coenzyme Q10 (CO Q 10) 10 MG CAPS Take by mouth daily.   colchicine 0.6 MG tablet Take 1 tablet by mouth daily. If you experience a flare, increase to 2 tablets by mouth daily.   dexlansoprazole (DEXILANT) 60 MG capsule Take 1 capsule (60 mg total) by mouth daily.   fluticasone (FLONASE) 50 MCG/ACT nasal spray Place 1 spray into both nostrils daily as needed for allergies or rhinitis.   lidocaine-prilocaine (EMLA) cream    meclizine (ANTIVERT) 12.5 MG tablet Take 1 tablet (12.5 mg total) by mouth 3 (three) times daily as needed for dizziness.   metoprolol succinate (TOPROL-XL) 100 MG 24 hr tablet TAKE 1 TABLET BY MOUTH EVERY DAY FOLLOWING MEALS   nitroGLYCERIN (NITROSTAT) 0.4 MG SL tablet Place 1 tablet (0.4 mg total) under the tongue every 5  (five) minutes as needed for chest pain.   NOVOLOG MIX 70/30 FLEXPEN (70-30) 100 UNIT/ML FlexPen INJECT 40 UNITS SUBCUTANEOUS EVERY MORNING AND 45 UNITS EVERY NIGHT AT BEDTIME   olmesartan (BENICAR) 40 MG tablet TAKE 1 TABLET(40 MG) BY MOUTH DAILY   ONETOUCH VERIO test strip USE TO CHECK BLOOD SUGARS TWICE DAILY AS DIRECTED   Semaglutide,0.25 or 0.5MG /DOS, (OZEMPIC, 0.25 OR 0.5 MG/DOSE,) 2 MG/1.5ML SOPN INJECT 0.5MG  INTO SKIN ONCE A WEEK   spironolactone-hydrochlorothiazide (ALDACTAZIDE) 25-25 MG tablet Take 1 tablet by mouth daily.   No facility-administered encounter medications on file as of 08/12/2021.   Billee Cashing, CMA Clinical Pharmacist Assistant [phone]

## 2021-08-31 ENCOUNTER — Encounter: Payer: Self-pay | Admitting: Interventional Cardiology

## 2021-09-04 DIAGNOSIS — N1831 Chronic kidney disease, stage 3a: Secondary | ICD-10-CM

## 2021-09-04 DIAGNOSIS — Z794 Long term (current) use of insulin: Secondary | ICD-10-CM

## 2021-09-04 DIAGNOSIS — E785 Hyperlipidemia, unspecified: Secondary | ICD-10-CM

## 2021-09-04 DIAGNOSIS — I10 Essential (primary) hypertension: Secondary | ICD-10-CM

## 2021-09-04 DIAGNOSIS — E1122 Type 2 diabetes mellitus with diabetic chronic kidney disease: Secondary | ICD-10-CM | POA: Diagnosis not present

## 2021-09-22 ENCOUNTER — Encounter: Payer: Self-pay | Admitting: Internal Medicine

## 2021-09-22 ENCOUNTER — Ambulatory Visit (INDEPENDENT_AMBULATORY_CARE_PROVIDER_SITE_OTHER): Payer: Medicare Other | Admitting: Internal Medicine

## 2021-09-22 ENCOUNTER — Other Ambulatory Visit: Payer: Self-pay

## 2021-09-22 VITALS — BP 138/64 | HR 93 | Temp 99.1°F | Ht 65.0 in | Wt 210.2 lb

## 2021-09-22 DIAGNOSIS — I1310 Hypertensive heart and chronic kidney disease without heart failure, with stage 1 through stage 4 chronic kidney disease, or unspecified chronic kidney disease: Secondary | ICD-10-CM

## 2021-09-22 DIAGNOSIS — Z6834 Body mass index (BMI) 34.0-34.9, adult: Secondary | ICD-10-CM

## 2021-09-22 DIAGNOSIS — I2510 Atherosclerotic heart disease of native coronary artery without angina pectoris: Secondary | ICD-10-CM

## 2021-09-22 DIAGNOSIS — N1831 Chronic kidney disease, stage 3a: Secondary | ICD-10-CM | POA: Diagnosis not present

## 2021-09-22 DIAGNOSIS — E6609 Other obesity due to excess calories: Secondary | ICD-10-CM

## 2021-09-22 DIAGNOSIS — E1122 Type 2 diabetes mellitus with diabetic chronic kidney disease: Secondary | ICD-10-CM | POA: Diagnosis not present

## 2021-09-22 DIAGNOSIS — G4733 Obstructive sleep apnea (adult) (pediatric): Secondary | ICD-10-CM

## 2021-09-22 DIAGNOSIS — R600 Localized edema: Secondary | ICD-10-CM

## 2021-09-22 DIAGNOSIS — Z794 Long term (current) use of insulin: Secondary | ICD-10-CM

## 2021-09-22 MED ORDER — COLCHICINE 0.6 MG PO TABS: ORAL_TABLET | ORAL | 2 refills | Status: DC

## 2021-09-22 MED ORDER — GABAPENTIN 100 MG PO CAPS: ORAL_CAPSULE | ORAL | 0 refills | Status: DC

## 2021-09-22 NOTE — Progress Notes (Signed)
Barnet Glasgow Martin,acting as a Education administrator for [name], MD.,have documented all relevant documentation on the behalf of [name], MD,as directed by  [name], MD while in the presence of [name], MD.    Subjective:     Patient ID: Dawn Wood , female    DOB: [birth year] , 76 y.o.   MRN: [id number]   Chief Complaint  Patient presents with  . Hypertension  . Diabetes    HPI  Patient presents today for a HTN/DM check. She reports compliance with meds. She denies headaches, chest pain and shortness of breath. She is most concerned about b/l ankle swelling.  She does admit to eating salty foods. Denies orthopnea.    Hypertension This is a chronic problem. The current episode started more than 1 year ago. The problem has been gradually improving since onset. Pertinent negatives include no blurred vision. Risk factors for coronary artery disease include diabetes mellitus, dyslipidemia, sedentary lifestyle and post-menopausal state. Hypertensive end-organ damage includes kidney disease.  Diabetes She presents for her follow-up diabetic visit. She has type 2 diabetes mellitus. Her disease course has been stable. There are no hypoglycemic associated symptoms. Pertinent negatives for diabetes include no blurred vision. There are no hypoglycemic complications. Diabetic complications include nephropathy. Risk factors for coronary artery disease include diabetes mellitus, dyslipidemia, hypertension, obesity, sedentary lifestyle and post-menopausal. Current diabetic treatment includes insulin injections. She is following a diabetic diet. She participates in exercise intermittently. Her home blood glucose trend is fluctuating minimally. Her breakfast blood glucose is taken between 8-9 am. Her breakfast blood glucose range is generally 110-130 mg/dl. Eye exam is current.     Past Medical History:  Diagnosis Date  . Coronary artery disease   . Diabetes mellitus without  complication ([place])   . GERD (gastroesophageal reflux disease)   . Hypertension   . MI (myocardial infarction) ([place])   . Peripheral vascular disease ([place])      Family History  Problem Relation Age of Onset  . Hypertension Mother   . Cancer Father   . Hypertension Father   . Breast cancer Sister 52  . Aortic stenosis Daughter   . Heart disease Daughter        before age 37  . Thyroid disease Daughter   . Sleep apnea Neg Hx      Current Outpatient Medications:  .  amLODipine (NORVASC) 5 MG tablet, Take 1 tablet (5 mg total) by mouth daily., Disp: 90 tablet, Rfl: 3 .  BD PEN NEEDLE NANO 2ND GEN 32G X 4 MM MISC, USE AS DIRECTED TWICE DAILY, Disp: 100 each, Rfl: 2 .  Cholecalciferol (VITAMIN D3) 2000 units TABS, Take 1 tablet by mouth daily., Disp: , Rfl:  .  clopidogrel (PLAVIX) 75 MG tablet, Take 1 tablet (75 mg total) by mouth daily., Disp: 90 tablet, Rfl: 3 .  Coenzyme Q10 (CO Q 10) 10 MG CAPS, Take by mouth daily., Disp: , Rfl:  .  dexlansoprazole (DEXILANT) 60 MG capsule, Take 1 capsule (60 mg total) by mouth daily., Disp: 30 capsule, Rfl: 1 .  fluticasone (FLONASE) 50 MCG/ACT nasal spray, Place 1 spray into both nostrils daily as needed for allergies or rhinitis., Disp: 16 g, Rfl: 2 .  lidocaine-prilocaine (EMLA) cream, , Disp: , Rfl:  .  meclizine (ANTIVERT) 12.5 MG tablet, Take 1 tablet (12.5 mg total) by mouth 3 (three) times daily as needed for dizziness., Disp: 30 tablet, Rfl: 0 .  metoprolol succinate (TOPROL-XL)  100 MG 24 hr tablet, TAKE 1 TABLET BY MOUTH EVERY DAY FOLLOWING MEALS, Disp: 90 tablet, Rfl: 1 .  nitroGLYCERIN (NITROSTAT) 0.4 MG SL tablet, Place 1 tablet (0.4 mg total) under the tongue every 5 (five) minutes as needed for chest pain., Disp: 25 tablet, Rfl: 3 .  NOVOLOG MIX 70/30 FLEXPEN (70-30) 100 UNIT/ML FlexPen, INJECT 40 UNITS SUBCUTANEOUS EVERY MORNING AND 45 UNITS EVERY NIGHT AT BEDTIME, Disp: 75 mL, Rfl: 2 .  olmesartan (BENICAR) 40 MG tablet, TAKE 1  TABLET(40 MG) BY MOUTH DAILY, Disp: 90 tablet, Rfl: 2 .  ONETOUCH VERIO test strip, USE TO CHECK BLOOD SUGARS TWICE DAILY AS DIRECTED, Disp: 100 strip, Rfl: 3 .  Semaglutide,0.25 or 0.5MG/DOS, (OZEMPIC, 0.25 OR 0.5 MG/DOSE,) 2 MG/1.5ML SOPN, INJECT 0.5MG INTO SKIN ONCE A WEEK, Disp: 4.5 mL, Rfl: 3 .  spironolactone-hydrochlorothiazide (ALDACTAZIDE) 25-25 MG tablet, Take 1 tablet by mouth daily., Disp: 90 tablet, Rfl: 3 .  colchicine 0.6 MG tablet, Take 1 tablet by mouth daily. If you experience a flare, increase to 2 tablets by mouth daily., Disp: 60 tablet, Rfl: 2 .  Evolocumab (REPATHA SURECLICK) 660 MG/ML SOAJ, Inject 1 Pen into the skin every 14 (fourteen) days., Disp: 2 mL, Rfl: 11 .  gabapentin (NEURONTIN) 100 MG capsule, One tab po qhs x 1 week, then 2 tabs po qhs, Disp: 180 capsule, Rfl: 0   Allergies  Allergen Reactions  . Dust Mite Extract Other (See Comments)    REACTION: SNEEZING  . Other Other (See Comments)    ALLERGEN: DEODORIZERS SPRAY AND PERFUMES REACTION: SNEEZING  . Statins Other (See Comments)    Muscle aches with rosuvastatin 5 mg daily and 3x/weekly, atorvastatin 20 mg daily and 3x/week, simvastatin 40 mg daily   . Allopurinol     Diarrhea and the pt said it made her sugars low  . Pollen Extract Other (See Comments)    Reaction unknown     Review of Systems  Constitutional: Negative.   HENT: Negative.    Eyes: Negative.  Negative for blurred vision.  Respiratory: Negative.    Cardiovascular:  Positive for leg swelling.  Gastrointestinal: Negative.   Endocrine: Negative.      Today's Vitals   09/22/21 1600  BP: 138/64  Pulse: 93  Temp: 99.1 F (37.3 C)  TempSrc: Oral  Weight: 210 lb 3.2 oz (95.3 kg)  Height: _0  (1.651 m)  PainSc: 0-No pain   Body mass index is 34.98 kg/m.   Objective:  Physical Exam Vitals and nursing note reviewed.  Constitutional:      Appearance: Normal appearance.  HENT:     Head: Normocephalic and atraumatic.  Eyes:      Extraocular Movements: Extraocular movements intact.  Cardiovascular:     Rate and Rhythm: Normal rate and regular rhythm.     Heart sounds: Normal heart sounds.  Pulmonary:     Effort: Pulmonary effort is normal.     Breath sounds: Normal breath sounds.  Musculoskeletal:     Cervical back: Normal range of motion.  Skin:    General: Skin is warm.  Neurological:     General: No focal deficit present.     Mental Status: She is alert.  Psychiatric:        Mood and Affect: Mood normal.        Behavior: Behavior normal.     Assessment And Plan:     1. Hypertensive heart and renal disease with renal failure, stage 1 through stage  4 or unspecified chronic kidney disease, without heart failure Comments: Chronic, fair control. Goal BP<130/80. She takes all meds in am, advised to take olmesartan/amlodipine/spironolactone in am and metoprolol nightly. - CMP14+EGFR - CBC no Diff - Lipid panel  2. Type 2 diabetes mellitus with stage 3a chronic kidney disease, with long-term current use of insulin (HCC) Comments: Chronic, I will check labs as below. Reminded to avoid NSAIDs due to CKD stage3.  - Hemoglobin A1c - CMP14+EGFR - CBC no Diff - Lipid panel  3. OSA (obstructive sleep apnea) Comments: NOt yet using CPAP, wants to address dental issues first.   4. Class 1 obesity due to excess calories with serious comorbidity and body mass index (BMI) of 34.0 to 34.9 in adult     Patient was given opportunity to ask questions. Patient verbalized understanding of the plan and was able to repeat key elements of the plan. All questions were answered to their satisfaction.  [name], MD   I, [name], MD, have reviewed all documentation for this visit. The documentation on 10/03/21 for the exam, diagnosis, procedures, and orders are all accurate and complete.   IF YOU HAVE BEEN REFERRED TO A SPECIALIST, IT MAY TAKE 1-2 WEEKS TO SCHEDULE/PROCESS THE REFERRAL. IF YOU HAVE NOT  HEARD FROM US/SPECIALIST IN TWO WEEKS, PLEASE GIVE [place] A CALL AT [phone] X [phone].   THE PATIENT IS ENCOURAGED TO PRACTICE SOCIAL DISTANCING DUE TO THE COVID-19 PANDEMIC.

## 2021-09-22 NOTE — Patient Instructions (Addendum)
Start taking metoprolol at NIGHT  Hypertension, Adult Hypertension is another name for high blood pressure. High blood pressure forces your heart to work harder to pump blood. This can cause problems over time. There are two numbers in a blood pressure reading. There is a top number (systolic) over a bottom number (diastolic). It is best to have a blood pressure that is below 120/80. What are the causes? The cause of this condition is not known. Some other conditions can lead to high blood pressure. What increases the risk? Some lifestyle factors can make you more likely to develop high blood pressure: Smoking. Not getting enough exercise or physical activity. Being overweight. Having too much fat, sugar, calories, or salt (sodium) in your diet. Drinking too much alcohol. Other risk factors include: Having any of these conditions: Heart disease. Diabetes. High cholesterol. Kidney disease. Obstructive sleep apnea. Having a family history of high blood pressure and high cholesterol. Age. The risk increases with age. Stress. What are the signs or symptoms? High blood pressure may not cause symptoms. Very high blood pressure (hypertensive crisis) may cause: Headache. Fast or uneven heartbeats (palpitations). Shortness of breath. Nosebleed. Vomiting or feeling like you may vomit (nauseous). Changes in how you see. Very bad chest pain. Feeling dizzy. Seizures. How is this treated? This condition is treated by making healthy lifestyle changes, such as: Eating healthy foods. Exercising more. Drinking less alcohol. Your doctor may prescribe medicine if lifestyle changes do not help enough and if: Your top number is above 130. Your bottom number is above 80. Your personal target blood pressure may vary. Follow these instructions at home: Eating and drinking  If told, follow the DASH eating plan. To follow this plan: Fill one half of your plate at each meal with fruits and  vegetables. Fill one fourth of your plate at each meal with whole grains. Whole grains include whole-wheat pasta, brown rice, and whole-grain bread. Eat or drink low-fat dairy products, such as skim milk or low-fat yogurt. Fill one fourth of your plate at each meal with low-fat (lean) proteins. Low-fat proteins include fish, chicken without skin, eggs, beans, and tofu. Avoid fatty meat, cured and processed meat, or chicken with skin. Avoid pre-made or processed food. Limit the amount of salt in your diet to less than 1,500 mg each day. Do not drink alcohol if: Your doctor tells you not to drink. You are pregnant, may be pregnant, or are planning to become pregnant. If you drink alcohol: Limit how much you have to: 0-1 drink a day for women. 0-2 drinks a day for men. Know how much alcohol is in your drink. In the [place] one drink equals one 12 oz bottle of beer (355 mL), one 5 oz glass of wine (148 mL), or one 1 oz glass of hard liquor (44 mL). Lifestyle  Work with your doctor to stay at a healthy weight or to lose weight. Ask your doctor what the best weight is for you. Get at least 30 minutes of exercise that causes your heart to beat faster (aerobic exercise) most days of the week. This may include walking, swimming, or biking. Get at least 30 minutes of exercise that strengthens your muscles (resistance exercise) at least 3 days a week. This may include lifting weights or doing Pilates. Do not smoke or use any products that contain nicotine or tobacco. If you need help quitting, ask your doctor. Check your blood pressure at home as told by your doctor. Keep all follow-up visits.  Medicines Take over-the-counter and prescription medicines only as told by your doctor. Follow directions carefully. Do not skip doses of blood pressure medicine. The medicine does not work as well if you skip doses. Skipping doses also puts you at risk for problems. Ask your doctor about side effects or  reactions to medicines that you should watch for. Contact a doctor if: You think you are having a reaction to the medicine you are taking. You have headaches that keep coming back. You feel dizzy. You have swelling in your ankles. You have trouble with your vision. Get help right away if: You get a very bad headache. You start to feel mixed up (confused). You feel weak or numb. You feel faint. You have very bad pain in your: Chest. Belly (abdomen). You vomit more than once. You have trouble breathing. These symptoms may be an emergency. Get help right away. Call 911. Do not wait to see if the symptoms will go away. Do not drive yourself to the hospital. Summary Hypertension is another name for high blood pressure. High blood pressure forces your heart to work harder to pump blood. For most people, a normal blood pressure is less than 120/80. Making healthy choices can help lower blood pressure. If your blood pressure does not get lower with healthy choices, you may need to take medicine. This information is not intended to replace advice given to you by your health care provider. Make sure you discuss any questions you have with your health care provider. Document Revised: 12/11/2020 Document Reviewed: 12/11/2020 Elsevier Patient Education  [place].

## 2021-09-23 ENCOUNTER — Telehealth: Payer: Self-pay

## 2021-09-23 LAB — LIPID PANEL
Chol/HDL Ratio: 3.9 ratio (ref 0.0–4.4)
Cholesterol, Total: 192 mg/dL (ref 100–199)
HDL: 49 mg/dL (ref >39)
LDL Chol Calc (NIH): 113 mg/dL — ABNORMAL HIGH (ref 0–99)
Triglycerides: 171 mg/dL — ABNORMAL HIGH (ref 0–149)
VLDL Cholesterol Cal: 30 mg/dL (ref 5–40)

## 2021-09-23 LAB — CMP14+EGFR
ALT: 12 IU/L (ref 0–32)
AST: 11 IU/L (ref 0–40)
Albumin/Globulin Ratio: 1.4 (ref 1.2–2.2)
Albumin: 4.2 g/dL (ref 3.8–4.8)
Alkaline Phosphatase: 87 IU/L (ref 44–121)
BUN/Creatinine Ratio: 16 (ref 12–28)
BUN: 17 mg/dL (ref 8–27)
Bilirubin Total: 0.6 mg/dL (ref 0.0–1.2)
CO2: 25 mmol/L (ref 20–29)
Calcium: 9.7 mg/dL (ref 8.7–10.3)
Chloride: 103 mmol/L (ref 96–106)
Creatinine, Ser: 1.04 mg/dL — ABNORMAL HIGH (ref 0.57–1.00)
Globulin, Total: 3.1 g/dL (ref 1.5–4.5)
Glucose: 137 mg/dL — ABNORMAL HIGH (ref 70–99)
Potassium: 4.3 mmol/L (ref 3.5–5.2)
Sodium: 142 mmol/L (ref 134–144)
Total Protein: 7.3 g/dL (ref 6.0–8.5)
eGFR: 56 mL/min/{1.73_m2} — ABNORMAL LOW (ref >59)

## 2021-09-23 LAB — CBC
Hematocrit: 42.2 % (ref 34.0–46.6)
Hemoglobin: 14 g/dL (ref 11.1–15.9)
MCH: 28.4 pg (ref 26.6–33.0)
MCHC: 33.2 g/dL (ref 31.5–35.7)
MCV: 86 fL (ref 79–97)
Platelets: 288 10*3/uL (ref 150–450)
RBC: 4.93 x10E6/uL (ref 3.77–5.28)
RDW: 13.1 % (ref 11.7–15.4)
WBC: 7.8 10*3/uL (ref 3.4–10.8)

## 2021-09-23 LAB — HEMOGLOBIN A1C
Est. average glucose Bld gHb Est-mCnc: 154 mg/dL
Hgb A1c MFr Bld: 7 % — ABNORMAL HIGH (ref 4.8–5.6)

## 2021-09-23 NOTE — Chronic Care Management (AMB) (Signed)
Chronic Care Management Pharmacy Assistant   Name: Dawn Wood  MRN: [id number] DOB: 18-Mar-1945   Reason for Encounter: Patient Assistance Coordination  Patient inquired about the status on  patient assistance application from [employer] and [employer]. Called [employer] and spoke with representative, they looked for her application but it was not found. Checked faxed records from June 7th when application was faxed and it shows that the fax failed. Verified fax number with representative and refaxed. [employer] to check on Ozempic and Rybelsus application, per [employer] application was received and patient was approved, patient is enrolled until 02/04/2022. Checked with [employer] for the status of shipment, no information available, waiting on hold to speak with a representative.  Spoke with Carmina she confirmed that patient was approved and medications were shipped on 09/10/2021 but returned to [employer] due to office being closed. She is processing a new shipment and medication will arrive to the office in 10-14 business days.  Called patient to inform the status of application for [employer] and [employer]. Patient aware that the application for MyP[employer]uent was refaxed today and she is also aware that her Ozempic and Rybelsus did arrive to the office but per patient she is not using the Rybelsus, she has only used the Ozempic. Patient aware I will speak with clinical pharmacist and PCP on which form of medication they would like patient to use and that medications should arrive to the office in 10-14 business days again. Patient checking to see if she has an available prescription at her local pharmacy for Praluent. Waiting on determination of patient assistance application.    Medications: Outpatient Encounter Medications as of 09/23/2021  Medication Sig   Alirocumab (PRALUENT) 75 MG/ML SOAJ Inject 75 mg into the skin every 14 (fourteen) days.   amLODipine (NORVASC) 5 MG  tablet Take 1 tablet (5 mg total) by mouth daily.   BD PEN NEEDLE NANO 2ND GEN 32G X 4 MM MISC USE AS DIRECTED TWICE DAILY   Cholecalciferol (VITAMIN D3) 2000 units TABS Take 1 tablet by mouth daily.   clopidogrel (PLAVIX) 75 MG tablet Take 1 tablet (75 mg total) by mouth daily.   Coenzyme Q10 (CO Q 10) 10 MG CAPS Take by mouth daily.   colchicine 0.6 MG tablet Take 1 tablet by mouth daily. If you experience a flare, increase to 2 tablets by mouth daily.   dexlansoprazole (DEXILANT) 60 MG capsule Take 1 capsule (60 mg total) by mouth daily.   fluticasone (FLONASE) 50 MCG/ACT nasal spray Place 1 spray into both nostrils daily as needed for allergies or rhinitis.   gabapentin (NEURONTIN) 100 MG capsule One tab po qhs x 1 week, then 2 tabs po qhs   lidocaine-prilocaine (EMLA) cream    meclizine (ANTIVERT) 12.5 MG tablet Take 1 tablet (12.5 mg total) by mouth 3 (three) times daily as needed for dizziness.   metoprolol succinate (TOPROL-XL) 100 MG 24 hr tablet TAKE 1 TABLET BY MOUTH EVERY DAY FOLLOWING MEALS   nitroGLYCERIN (NITROSTAT) 0.4 MG SL tablet Place 1 tablet (0.4 mg total) under the tongue every 5 (five) minutes as needed for chest pain.   NOVOLOG MIX 70/30 FLEXPEN (70-30) 100 UNIT/ML FlexPen INJECT 40 UNITS SUBCUTANEOUS EVERY MORNING AND 45 UNITS EVERY NIGHT AT BEDTIME   olmesartan (BENICAR) 40 MG tablet TAKE 1 TABLET(40 MG) BY MOUTH DAILY   ONETOUCH VERIO test strip USE TO CHECK BLOOD SUGARS TWICE DAILY AS DIRECTED   Semaglutide,0.25 or 0.5MG /DOS, (OZEMPIC,  0.25 OR 0.5 MG/DOSE,) 2 MG/1.5ML SOPN INJECT 0.5MG  INTO SKIN ONCE A WEEK   spironolactone-hydrochlorothiazide (ALDACTAZIDE) 25-25 MG tablet Take 1 tablet by mouth daily.   No facility-administered encounter medications on file as of 09/23/2021.   Billee Cashing, CMA Clinical Pharmacist Assistant [phone]

## 2021-09-24 ENCOUNTER — Telehealth: Payer: Self-pay | Admitting: Interventional Cardiology

## 2021-09-24 NOTE — Telephone Encounter (Addendum)
Insurance stopped Pension scheme manager and now prefers Repatha. Submitted pt demographic info, waiting for clinical questions for prior authorization. Pt will need to call in to Rep[employer] Ready at [phone] for copay card since she's > 65.

## 2021-09-24 NOTE — Telephone Encounter (Signed)
Brayton Caves from [facility] pharmacy called asking on the status of a PA for Praluent faxed over to the office yesterday. Informed her I did not see anything in pt chart yet. Please advise.

## 2021-09-25 ENCOUNTER — Telehealth: Payer: Self-pay | Admitting: Interventional Cardiology

## 2021-09-25 MED ORDER — REPATHA SURECLICK 140 MG/ML ~~LOC~~ SOAJ: 1.0000 | SUBCUTANEOUS | 11 refills | Status: DC

## 2021-09-25 NOTE — Telephone Encounter (Signed)
Patient called back stating she has more questions regarding her medication change.

## 2021-09-25 NOTE — Telephone Encounter (Signed)
This is pt's 4th call in today about getting a copay card. States now Repatha Ready wants information from her prescription drug coverage card. States she does not have this. Advised her it's her [facility] card. She will call them back again.

## 2021-09-25 NOTE — Telephone Encounter (Signed)
I have not called pt asking her to call back, I have already spoken with her twice today. Still has questions about copay card. States they're asking again about Medicare. Advised her again she does not have this for her prescription drug coverage. States they told her she needs to have an email address and she doesn't have one. Advised her to tell them that and ask them to mail her the copay card to her actual address, an email should not be required. Copay card company does not let providers call on behalf of pts and will not let us activate cards online for pts over 65 so pt needs to activate card herself. She is aware.

## 2021-09-25 NOTE — Telephone Encounter (Signed)
Spoke with pt. States she was told that she does not qualify for copay card due to her insurance type. She does, again advised pt to tell them she has commercial not [employer]. She will call them back again.

## 2021-09-25 NOTE — Telephone Encounter (Signed)
Repatha available on formulary. Pt aware to change from Praluent to Repatha. She will call in to Repatha Ready to activate copay card.

## 2021-09-25 NOTE — Telephone Encounter (Signed)
Pt called again stating she wants to speak to [facility] RPH-CPP

## 2021-09-25 NOTE — Telephone Encounter (Signed)
Pt returning a call to [place] [facility] [place], RPH-CPP

## 2021-10-12 ENCOUNTER — Other Ambulatory Visit: Payer: Self-pay | Admitting: Internal Medicine

## 2021-10-13 ENCOUNTER — Other Ambulatory Visit: Payer: Self-pay | Admitting: Internal Medicine

## 2021-10-13 ENCOUNTER — Telehealth: Payer: Self-pay

## 2021-10-13 NOTE — Telephone Encounter (Signed)
Called patient to let her know that Ozempic came in and is ready for pick up. Patient confirmed that she was denied for Praluent but she was [facility]. Patient reports that she is going to pick up her Ozempic tomorrow. She is receiving a 5 month supply. Reminded patient that patient assistance application will probably be mailed and or reviewed for next year around  November. Ms. Brunner voiced understanding and was thankful.   Cherylin Mylar, CPP, PharmD Clinical Pharmacist Practitioner [facility] [phone]

## 2021-10-15 ENCOUNTER — Telehealth: Payer: Self-pay

## 2021-10-15 NOTE — Progress Notes (Signed)
10-15-2021: Called patient to request medicare card to process praluent application. Patient stated she no longer needs praluent she has been approved for repatha. Informed Billee Cashing to disregard application.  Huey Romans [facility] Clinical Pharmacist Assistant [phone]

## 2021-10-16 ENCOUNTER — Telehealth: Payer: Self-pay

## 2021-10-16 NOTE — Chronic Care Management (AMB) (Signed)
[employer] patient assistance program notification:  Rybelsus 14 mg arrived to office but patient has Ozempic also, getting clarification from PCP and Pharm D on which medication patient should be on so I can cancel medication not needed.  Spoke with Cherylin Mylar, we will get Rybelsus cancelled and returned. Sent task to [employer], CMA to [employer] for return shipping label.   Billee Cashing, CMA Clinical Pharmacist Assistant [phone]

## 2021-10-20 ENCOUNTER — Other Ambulatory Visit: Payer: Self-pay | Admitting: Internal Medicine

## 2021-10-20 ENCOUNTER — Ambulatory Visit (INDEPENDENT_AMBULATORY_CARE_PROVIDER_SITE_OTHER): Payer: Medicare Other | Admitting: Internal Medicine

## 2021-10-20 ENCOUNTER — Encounter: Payer: Self-pay | Admitting: Internal Medicine

## 2021-10-20 VITALS — BP 132/64 | HR 73 | Temp 98.5°F | Ht 65.0 in | Wt 210.4 lb

## 2021-10-20 DIAGNOSIS — E1122 Type 2 diabetes mellitus with diabetic chronic kidney disease: Secondary | ICD-10-CM | POA: Diagnosis not present

## 2021-10-20 DIAGNOSIS — N1831 Chronic kidney disease, stage 3a: Secondary | ICD-10-CM | POA: Diagnosis not present

## 2021-10-20 DIAGNOSIS — Z794 Long term (current) use of insulin: Secondary | ICD-10-CM | POA: Diagnosis not present

## 2021-10-20 DIAGNOSIS — I739 Peripheral vascular disease, unspecified: Secondary | ICD-10-CM | POA: Diagnosis not present

## 2021-10-20 DIAGNOSIS — Z0000 Encounter for general adult medical examination without abnormal findings: Secondary | ICD-10-CM

## 2021-10-20 DIAGNOSIS — H6123 Impacted cerumen, bilateral: Secondary | ICD-10-CM | POA: Diagnosis not present

## 2021-10-20 DIAGNOSIS — K219 Gastro-esophageal reflux disease without esophagitis: Secondary | ICD-10-CM

## 2021-10-20 DIAGNOSIS — E66812 Obesity, class 2: Secondary | ICD-10-CM

## 2021-10-20 DIAGNOSIS — I1310 Hypertensive heart and chronic kidney disease without heart failure, with stage 1 through stage 4 chronic kidney disease, or unspecified chronic kidney disease: Secondary | ICD-10-CM

## 2021-10-20 DIAGNOSIS — I2510 Atherosclerotic heart disease of native coronary artery without angina pectoris: Secondary | ICD-10-CM

## 2021-10-20 DIAGNOSIS — Z6835 Body mass index (BMI) 35.0-35.9, adult: Secondary | ICD-10-CM

## 2021-10-20 DIAGNOSIS — H9193 Unspecified hearing loss, bilateral: Secondary | ICD-10-CM

## 2021-10-20 LAB — POCT URINALYSIS DIPSTICK
Blood, UA: NEGATIVE
Glucose, UA: NEGATIVE
Ketones, UA: NEGATIVE
Nitrite, UA: NEGATIVE
Protein, UA: NEGATIVE
Spec Grav, UA: >=1.03 — AB (ref 1.010–1.025)
Urobilinogen, UA: 0.2 E.U./dL
pH, UA: 6 (ref 5.0–8.0)

## 2021-10-20 MED ORDER — FAMOTIDINE 20 MG PO TABS: 20.0000 mg | ORAL_TABLET | Freq: Two times a day (BID) | ORAL | 1 refills | Status: DC

## 2021-10-20 NOTE — Patient Instructions (Addendum)
Dawn Wood , Thank you for taking time to come for your Medicare Wellness Visit. I appreciate your ongoing commitment to your health goals. Please review the following plan we discussed and let me know if I can assist you in the future.   These are the goals we discussed:  Goals      Exercise 150 min/wk Moderate Activity     Manage My Medicine     Timeframe:  Long-Range Goal Priority:  High Start Date:                             Expected End Date:                       Follow Up Date 11/24/2021    - call for medicine refill 2 or 3 days before it runs out - call if I am sick and can't take my medicine - keep a list of all the medicines I take; vitamins and herbals too - use a pillbox to sort medicine - use an alarm clock or phone to remind me to take my medicine    Why is this important?   These steps will help you keep on track with your medicines.   Notes:  -We will be able to send off your patient assistance applications for Praluent and Ozempic once you sign the applications, and bring your financial documents. j        This is a list of the screening recommended for you and due dates:  Health Maintenance  Topic Date Due   Zoster (Shingles) Vaccine (1 of 2) Never done   COVID-19 Vaccine (3 - Janssen risk series) 04/17/2020   Flu Shot  10/06/2021   Pneumonia Vaccine (1 - PCV) 02/16/2022*   Hemoglobin A1C  03/25/2022   Eye exam for diabetics  05/22/2022   Complete foot exam   10/21/2022   Tetanus Vaccine  11/05/2027   DEXA scan (bone density measurement)  Completed   Hepatitis C Screening: USPSTF Recommendation to screen - Ages 72-79 yo.  Completed   HPV Vaccine  Aged Out   Colon Cancer Screening  Discontinued  *Topic was postponed. The date shown is not the original due date.     Health Maintenance, Female Adopting a healthy lifestyle and getting preventive care are important in promoting health and wellness. Ask your health care provider about: The right  schedule for you to have regular tests and exams. Things you can do on your own to prevent diseases and keep yourself healthy. What should I know about diet, weight, and exercise? Eat a healthy diet  Eat a diet that includes plenty of vegetables, fruits, low-fat dairy products, and lean protein. Do not eat a lot of foods that are high in solid fats, added sugars, or sodium. Maintain a healthy weight Body mass index (BMI) is used to identify weight problems. It estimates body fat based on height and weight. Your health care provider can help determine your BMI and help you achieve or maintain a healthy weight. Get regular exercise Get regular exercise. This is one of the most important things you can do for your health. Most adults should: Exercise for at least 150 minutes each week. The exercise should increase your heart rate and make you sweat (moderate-intensity exercise). Do strengthening exercises at least twice a week. This is in addition to the moderate-intensity exercise. Spend less time sitting. Even light physical  activity can be beneficial. Watch cholesterol and blood lipids Have your blood tested for lipids and cholesterol at 77 years of age, then have this test every 5 years. Have your cholesterol levels checked more often if: Your lipid or cholesterol levels are high. You are older than 77 years of age. You are at high risk for heart disease. What should I know about cancer screening? Depending on your health history and family history, you may need to have cancer screening at various ages. This may include screening for: Breast cancer. Cervical cancer. Colorectal cancer. Skin cancer. Lung cancer. What should I know about heart disease, diabetes, and high blood pressure? Blood pressure and heart disease High blood pressure causes heart disease and increases the risk of stroke. This is more likely to develop in people who have high blood pressure readings or are  overweight. Have your blood pressure checked: Every 3-5 years if you are 72-16 years of age. Every year if you are 73 years old or older. Diabetes Have regular diabetes screenings. This checks your fasting blood sugar level. Have the screening done: Once every three years after age 40 if you are at a normal weight and have a low risk for diabetes. More often and at a younger age if you are overweight or have a high risk for diabetes. What should I know about preventing infection? Hepatitis B If you have a higher risk for hepatitis B, you should be screened for this virus. Talk with your health care provider to find out if you are at risk for hepatitis B infection. Hepatitis C Testing is recommended for: Everyone born from [birth year] through 1966. Anyone with known risk factors for hepatitis C. Sexually transmitted infections (STIs) Get screened for STIs, including gonorrhea and chlamydia, if: You are sexually active and are younger than 77 years of age. You are older than 77 years of age and your health care provider tells you that you are at risk for this type of infection. Your sexual activity has changed since you were last screened, and you are at increased risk for chlamydia or gonorrhea. Ask your health care provider if you are at risk. Ask your health care provider about whether you are at high risk for HIV. Your health care provider may recommend a prescription medicine to help prevent HIV infection. If you choose to take medicine to prevent HIV, you should first get tested for HIV. You should then be tested every 3 months for as long as you are taking the medicine. Pregnancy If you are about to stop having your period (premenopausal) and you may become pregnant, seek counseling before you get pregnant. Take 400 to 800 micrograms (mcg) of folic acid every day if you become pregnant. Ask for birth control (contraception) if you want to prevent pregnancy. Osteoporosis and  menopause Osteoporosis is a disease in which the bones lose minerals and strength with aging. This can result in bone fractures. If you are 79 years old or older, or if you are at risk for osteoporosis and fractures, ask your health care provider if you should: Be screened for bone loss. Take a calcium or vitamin D supplement to lower your risk of fractures. Be given hormone replacement therapy (HRT) to treat symptoms of menopause. Follow these instructions at home: Alcohol use Do not drink alcohol if: Your health care provider tells you not to drink. You are pregnant, may be pregnant, or are planning to become pregnant. If you drink alcohol: Limit how much you have  to: 0-1 drink a day. Know how much alcohol is in your drink. In the [place] one drink equals one 12 oz bottle of beer (355 mL), one 5 oz glass of wine (148 mL), or one 1 oz glass of hard liquor (44 mL). Lifestyle Do not use any products that contain nicotine or tobacco. These products include cigarettes, chewing tobacco, and vaping devices, such as e-cigarettes. If you need help quitting, ask your health care provider. Do not use street drugs. Do not share needles. Ask your health care provider for help if you need support or information about quitting drugs. General instructions Schedule regular health, dental, and eye exams. Stay current with your vaccines. Tell your health care provider if: You often feel depressed. You have ever been abused or do not feel safe at home. Summary Adopting a healthy lifestyle and getting preventive care are important in promoting health and wellness. Follow your health care provider's instructions about healthy diet, exercising, and getting tested or screened for diseases. Follow your health care provider's instructions on monitoring your cholesterol and blood pressure. This information is not intended to replace advice given to you by your health care provider. Make sure you discuss any  questions you have with your health care provider. Document Revised: 07/14/2020 Document Reviewed: 07/14/2020 Elsevier Patient Education  2023 [employer].

## 2021-10-20 NOTE — Progress Notes (Signed)
Subjective:    Dawn Wood is a 76 y.o. female who presents for a Welcome to Medicare exam.   Cardiac Risk Factors include: diabetes mellitus;obesity (BMI >30kg/m2);sedentary lifestyle;advanced age (>65men, >20 women) Patient is here today for Welcome to Medicare physical.   She retired earlier this summer. She reports compliance with meds. She denies headaches, chest pain and shortness of breath.   Hypertension This is a chronic problem. The current episode started more than 1 year ago. The problem has been gradually improving since onset. Pertinent negatives include no blurred vision. Risk factors for coronary artery disease include diabetes mellitus, dyslipidemia, sedentary lifestyle and post-menopausal state. Hypertensive end-organ damage includes kidney disease.  Diabetes She presents for her follow-up diabetic visit. She has type 2 diabetes mellitus. Her disease course has been stable. There are no hypoglycemic associated symptoms. Pertinent negatives for diabetes include no blurred vision. There are no hypoglycemic complications. Diabetic complications include nephropathy. Risk factors for coronary artery disease include diabetes mellitus, dyslipidemia, hypertension, obesity, sedentary lifestyle and post-menopausal. Current diabetic treatment includes insulin injections. She is following a diabetic diet. She participates in exercise intermittently. Her home blood glucose trend is fluctuating minimally. Her breakfast blood glucose is taken between 8-9 am. Her breakfast blood glucose range is generally 110-130 mg/dl. Eye exam is current.   Review of Systems  Constitutional: Negative.   HENT: Negative.    Eyes: Negative.  Negative for blurred vision.  Respiratory: Negative.    Cardiovascular: Negative.   Gastrointestinal: Negative.   Skin: Negative.   Neurological: Negative.   Endo/Heme/Allergies: Negative.   Psychiatric/Behavioral: Negative.        Objective:    Today's  Vitals   10/20/21 1453  BP: 132/64  Pulse: 73  Temp: 98.5 F (36.9 C)  SpO2: 96%  Weight: 210 lb 6.4 oz (95.4 kg)  Height: 5\' 5"  (1.651 m)  PainSc: 7   Body mass index is 35.01 kg/m. Wt Readings from Last 3 Encounters:  10/20/21 210 lb 6.4 oz (95.4 kg)  09/22/21 210 lb 3.2 oz (95.3 kg)  07/30/21 211 lb 3.2 oz (95.8 kg)    Medications Outpatient Encounter Medications as of 10/20/2021  Medication Sig   amLODipine (NORVASC) 5 MG tablet Take 1 tablet (5 mg total) by mouth daily.   BD PEN NEEDLE NANO 2ND GEN 32G X 4 MM MISC USE AS DIRECTED TWICE DAILY   Cholecalciferol (VITAMIN D3) 2000 units TABS Take 1 tablet by mouth daily.   clopidogrel (PLAVIX) 75 MG tablet Take 1 tablet (75 mg total) by mouth daily.   Coenzyme Q10 (CO Q 10) 10 MG CAPS Take by mouth daily.   colchicine 0.6 MG tablet Take 1 tablet by mouth daily. If you experience a flare, increase to 2 tablets by mouth daily.   Evolocumab (REPATHA SURECLICK) [id number] MG/ML SOAJ Inject 1 Pen into the skin every 14 (fourteen) days.   fluticasone (FLONASE) 50 MCG/ACT nasal spray SHAKE LIQUID AND USE 1 SPRAY IN EACH NOSTRIL DAILY AS NEEDED FOR ALLERGIES OR RHINITIS   gabapentin (NEURONTIN) 100 MG capsule One tab po qhs x 1 week, then 2 tabs po qhs   lidocaine-prilocaine (EMLA) cream    metoprolol succinate (TOPROL-XL) 100 MG 24 hr tablet TAKE 1 TABLET BY MOUTH EVERY DAY FOLLOWING MEALS   nitroGLYCERIN (NITROSTAT) 0.4 MG SL tablet Place 1 tablet (0.4 mg total) under the tongue every 5 (five) minutes as needed for chest pain.   NOVOLOG MIX 70/30 FLEXPEN (70-30) 100 UNIT/ML FlexPen  INJECT 40 UNITS SUBCUTANEOUS EVERY MORNING AND 45 UNITS EVERY NIGHT AT BEDTIME   olmesartan (BENICAR) 40 MG tablet TAKE 1 TABLET(40 MG) BY MOUTH DAILY   ONETOUCH VERIO test strip USE TO CHECK BLOOD SUGARS TWICE DAILY AS DIRECTED   Semaglutide,0.25 or 0.5MG /DOS, (OZEMPIC, 0.25 OR 0.5 MG/DOSE,) 2 MG/1.5ML SOPN INJECT 0.5MG  INTO SKIN ONCE A WEEK    spironolactone-hydrochlorothiazide (ALDACTAZIDE) 25-25 MG tablet Take 1 tablet by mouth daily.   [DISCONTINUED] famotidine (PEPCID) 20 MG tablet Take 1 tablet (20 mg total) by mouth 2 (two) times daily.   meclizine (ANTIVERT) 12.5 MG tablet Take 1 tablet (12.5 mg total) by mouth 3 (three) times daily as needed for dizziness. (Patient not taking: Reported on 10/20/2021)   [DISCONTINUED] dexlansoprazole (DEXILANT) 60 MG capsule Take 1 capsule (60 mg total) by mouth daily. (Patient not taking: Reported on 10/20/2021)   No facility-administered encounter medications on file as of 10/20/2021.     History: Past Medical History:  Diagnosis Date   Coronary artery disease    Diabetes mellitus without complication (HCC)    GERD (gastroesophageal reflux disease)    Hypertension    MI (myocardial infarction) (HCC)    Peripheral vascular disease (HCC)    Past Surgical History:  Procedure Laterality Date   ABDOMINAL HYSTERECTOMY  1985   APPENDECTOMY     CATARACT EXTRACTION Left 02/20/2018   Dr. Harlon Flor   CORONARY STENT PLACEMENT  2015   ectopic pregnancy--unilateral salpingectomy  1980   KNEE ARTHROSCOPY Left 03/2016   unilateral oophorectomy      Family History  Problem Relation Age of Onset   Hypertension Mother    Cancer Father    Hypertension Father    Breast cancer Sister 65   Aortic stenosis Daughter    Heart disease Daughter        before age 64   Thyroid disease Daughter    Sleep apnea Neg Hx    Social History   Occupational History   Not on file  Tobacco Use   Smoking status: Former    Packs/day: 0.25    Years: 45.00    Total pack years: 11.25    Types: Cigarettes    Quit date: 01/10/2013    Years since quitting: 8.7   Smokeless tobacco: Former  Building services engineer Use: Never used  Substance and Sexual Activity   Alcohol use: Not Currently    Alcohol/week: 0.0 standard drinks of alcohol   Drug use: No   Sexual activity: Not on file    Tobacco  Counseling Counseling given: Yes   Immunizations and Health Maintenance Immunization History  Administered Date(s) Administered   Fluad Quad(high Dose 65+) 04/06/2021   Janssen (J&J) SARS-COV-2 Vaccination 05/25/2019   PFIZER(Purple Top)SARS-COV-2 Vaccination 02/21/2020   Tdap 11/04/2017   Health Maintenance Due  Topic Date Due   Zoster Vaccines- Shingrix (1 of 2) Never done   COVID-19 Vaccine (3 - Janssen risk series) 04/17/2020   INFLUENZA VACCINE  10/06/2021    Activities of Daily Living    10/20/2021    3:38 PM  In your present state of health, do you have any difficulty performing the following activities:  Hearing? 0  Vision? 1  Comment cataract r  Difficulty concentrating or making decisions? 0  Walking or climbing stairs? 1  Comment ankle pain  Dressing or bathing? 0  Doing errands, shopping? 0  Preparing Food and eating ? N  Using the Toilet? N  In the past six months,  have you accidently leaked urine? Y  Do you have problems with loss of bowel control? N  Managing your Medications? N  Managing your Finances? N    Physical Exam    Physical Exam Vitals and nursing note reviewed.  Constitutional:      Appearance: Normal appearance.  HENT:     Head: Normocephalic and atraumatic.     Right Ear: Ear canal and external ear normal. There is impacted cerumen.     Left Ear: Ear canal and external ear normal. There is impacted cerumen.     Nose: No congestion.     Mouth/Throat:     Pharynx: No oropharyngeal exudate or posterior oropharyngeal erythema.  Eyes:     Extraocular Movements: Extraocular movements intact.     Conjunctiva/sclera: Conjunctivae normal.     Pupils: Pupils are equal, round, and reactive to light.  Cardiovascular:     Rate and Rhythm: Normal rate and regular rhythm.     Pulses:          Dorsalis pedis pulses are 1+ on the right side and 0 on the left side.     Heart sounds: Normal heart sounds.  Pulmonary:     Effort: Pulmonary effort  is normal.     Breath sounds: Normal breath sounds.  Chest:  Breasts:    Tanner Score is 5.     Right: Normal.     Left: Normal.  Abdominal:     General: Bowel sounds are normal.     Palpations: Abdomen is soft.     Comments: Rounded, soft.   Musculoskeletal:        General: Normal range of motion.     Cervical back: Normal range of motion.     Right lower leg: Edema present.     Left lower leg: Edema present.  Feet:     Right foot:     Protective Sensation: 5 sites tested.  5 sites sensed.     Skin integrity: Dry skin present.     Toenail Condition: Right toenails are long.     Left foot:     Protective Sensation: 5 sites tested.  5 sites sensed.     Skin integrity: Dry skin present.     Toenail Condition: Left toenails are long.  Skin:    General: Skin is warm and dry.  Neurological:     General: No focal deficit present.     Mental Status: She is alert and oriented to person, place, and time.  Psychiatric:        Mood and Affect: Mood normal.        Behavior: Behavior normal.     Advanced Directives: Does Patient Have a Medical Advance Directive?: No Would patient like information on creating a medical advance directive?: Yes (MAU/Ambulatory/Procedural Areas - Information given)    Assessment:    This is a routine wellness examination for this patient .  Vision/Hearing screen Hearing Screening   500Hz  1000Hz  2000Hz  4000Hz   Right ear Fail Pass Fail Fail  Left ear Fail Pass Fail Fail   Vision Screening   Right eye Left eye Both eyes  Without correction 20/200 20/50 20/50  With correction       Dietary issues and exercise activities discussed:  Current Exercise Habits: The patient does not participate in regular exercise at present, Exercise limited by: orthopedic condition(s)   Goals      Exercise 150 min/wk Moderate Activity     Manage My Medicine  Timeframe:  Long-Range Goal Priority:  High Start Date:                             Expected End  Date:                       Follow Up Date 11/24/2021    - call for medicine refill 2 or 3 days before it runs out - call if I am sick and can't take my medicine - keep a list of all the medicines I take; vitamins and herbals too - use a pillbox to sort medicine - use an alarm clock or phone to remind me to take my medicine    Why is this important?   These steps will help you keep on track with your medicines.   Notes:  -We will be able to send off your patient assistance applications for Praluent and Ozempic once you sign the applications, and bring your financial documents. j      Depression Screen    10/20/2021    2:54 PM 06/18/2021    2:56 PM 12/18/2020    3:40 PM 10/14/2020   10:01 AM  PHQ 2/9 Scores  PHQ - 2 Score 2 0 1 0  PHQ- 9 Score 8 0 3 0     Fall Risk    10/20/2021    3:00 PM  Fall Risk   Falls in the past year? 0  Number falls in past yr: 0  Injury with Fall? 0  Risk for fall due to : No Fall Risks  Follow up Falls evaluation completed    Cognitive Function:        10/20/2021    3:01 PM 10/04/2019   10:19 AM  6CIT Screen  What Year? 0 points 0 points  What month? 0 points 0 points  What time? 0 points 0 points  Count back from 20 0 points 0 points  Months in reverse 0 points 0 points  Repeat phrase 2 points 0 points  Total Score 2 points 0 points    Patient Care Team: Glendale Chard, MD as PCP - General (Internal Medicine) Belva Crome, MD as PCP - Cardiology (Cardiology) Glendale Chard, MD as Attending Physician (Internal Medicine) Marylynn Pearson, MD as Consulting Physician (Ophthalmology) Mayford Knife, [facility] (Pharmacist)     Plan:   1. Encounter for Medicare annual wellness exam The annual wellness visit was performed including discussion of advanced directives, assessment of functional status and cognitive function. EKG performed, SB w/o acute changes. A full exam was also performed. She will rto in one year for AWV with [facility]  Advisor.  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 Goal BP <130/80. Advised to follow low sodium diet. EKG performed, SB w/o acute changes. She will c/w amlodipine 5mg , metoprolol xl 100mg  and olmesartan 40mg  daily.  - AMB Referral to [place] - Amb Referral To Provider Referral Exercise Program (P.R.E.P) - POCT Urinalysis Dipstick (81002) - Microalbumin / creatinine urine ratio - EKG 12-Lead  3. Type 2 diabetes mellitus with stage 3a chronic kidney disease, with long-term current use of insulin (HCC) Comments: Chronic, diabetic foot exam was performed. She will f/u in 4 months.  - AMB Referral to [place] - Amb Referral To Provider Referral Exercise Program (P.R.E.P) -  POCT Urinalysis Dipstick (81002) - Microalbumin / creatinine urine ratio - EKG 12-Lead  4. PVD (peripheral vascular disease) (HCC) Comments: Chronic. I will schedule her for f/u ABIs.Importance of medication compliance and regular exercise was d/w patient.  - [facility]B Referral to [facility] - Amb Referral To Provider Referral Exercise Program (P.R.E.P) - VAS [place] ABI WITH/WO TBI; Future  5. Gastroesophageal reflux disease without esophagitis Comments: She is reminded to stop late night eating and to stop eating 3 hours prior to lying down. She was given rx famotidine to take once daily. She will let me know   6. Bilateral impacted cerumen Comments: After obtaining verbal consent, both ears were flushed by irrigation without complication. No TM abnormalities noted.  - Ambulatory referral to ENT  7. Decreased hearing of both ears Comments: She agrees to ENT referral for further evaluation.  - Ambulatory referral to ENT  8. Class 2 severe obesity due to excess calories with serious comorbidity and body mass index (BMI) of 35.0 to 35.9 in  adult [facility]) Comments: She is encouraged to incorporate more exercise into her daily routine. She reluctantly  agrees to PREP referral.     I have personally reviewed and noted the following in the patient's chart:   Medical and social history Use of alcohol, tobacco or illicit drugs  Current medications and supplements Functional ability and status Nutritional status Physical activity Advanced directives List of other physicians Hospitalizations, surgeries, and ER visits in previous 12 months Vitals Screenings to include cognitive, depression, and falls Referrals and appointments  In addition, I have reviewed and discussed with patient certain preventive protocols, quality metrics, and best practice recommendations. A written personalized care plan for preventive services as well as general preventive health recommendations were provided to patient.     [name], MD 10/25/2021

## 2021-10-23 ENCOUNTER — Telehealth: Payer: Self-pay

## 2021-10-23 NOTE — Chronic Care Management (AMB) (Signed)
Called [employer] to cancel Rybelsus 14 mg from patient assistance profile and to receive a return shipping label. Spoke with representative they will cancel Rybelsus medication and fax a return shipping label to the office.  Billee Cashing, CMA Clinical Pharmacist Assistant [phone]

## 2021-10-23 NOTE — Telephone Encounter (Signed)
Call to pt reference PREP referral Explained program to pt, location. Prefers afternoon. Next afternoon Dawn Wood class will start on 01/04/22 at 230p-345p. Will call her closer to start of class to do intake interview.

## 2021-10-29 ENCOUNTER — Ambulatory Visit: Payer: Self-pay

## 2021-10-29 NOTE — Patient Outreach (Signed)
  Care Coordination   Initial Visit Note   10/29/2021 Name: Dawn Wood MRN: [id number] DOB: 1945-04-16  Dawn Wood is a 76 y.o. year old female who sees Dawn Peng, MD for primary care. I spoke with  Dawn Wood by phone today  What matters to the patients health and wellness today?  Doing well, no concerns at this time    Goals Addressed             This Visit's Progress    COMPLETED: Care Coordination Activities       Care Coordination Interventions: SDoH screening completed - no acute challenges identified at this time Discussed the patient does not currently have concerns with medication costs Reviewed patient recently retired and may return to work early next year due to feeling bored at home Education provided to the patient on the Go 365 state health plan benefit Performed chart review to note patient enrolled in [facility] Physician Referral Exercise Program (PREP)  Discussed patient will start this class in October Instructed patient to contact her primary care provider as needed Collaboration with Dr. Allyne Wood to advise of interventions and plan         SDOH assessments and interventions completed:  Yes  SDOH Interventions Today    Flowsheet Row Most Recent Value  SDOH Interventions   Food Insecurity Interventions Intervention Not Indicated  Housing Interventions Intervention Not Indicated  Transportation Interventions Intervention Not Indicated        Care Coordination Interventions Activated:  Yes  Care Coordination Interventions:  Yes, provided   Follow up plan: No further intervention required.   Encounter Outcome:  Pt. Visit Completed   Dawn Wood, BSW, CDP Social Worker, Certified Dementia Practitioner Care Coordination [phone]

## 2021-10-29 NOTE — Patient Instructions (Signed)
Visit Information  Thank you for taking time to visit with me today. Please don't hesitate to contact me if I can be of assistance to you.   Following are the goals we discussed today:   Goals Addressed             This Visit's Progress    COMPLETED: Care Coordination Activities       Care Coordination Interventions: SDoH screening completed - no acute challenges identified at this time Discussed the patient does not currently have concerns with medication costs Reviewed patient recently retired and may return to work early next year due to feeling bored at home Education provided to the patient on the Go 365 state health plan benefit Performed chart review to note patient enrolled in [facility] Physician Referral Exercise Program (PREP)  Discussed patient will start this class in October Instructed patient to contact her primary care provider as needed Collaboration with Dr. Allyne Gee to advise of interventions and plan         Please call the care guide team at [phone] if you need to schedule an appointment with me.  If you are experiencing a Mental Health or Behavioral Health Crisis or need someone to talk to, please call 1-800-273-TALK (toll free, 24 hour hotline)  The patient verbalized understanding of instructions, educational materials, and care plan provided today and DECLINED offer to receive copy of patient instructions, educational materials, and care plan.   No further follow up required: Please contact your primary care provider as needed  Bevelyn Ngo, BSW, CDP Social Worker, Certified Dementia Practitioner Care Coordination [phone]

## 2021-11-02 ENCOUNTER — Ambulatory Visit (HOSPITAL_COMMUNITY): Payer: BC Managed Care – PPO

## 2021-11-05 ENCOUNTER — Other Ambulatory Visit: Payer: Self-pay | Admitting: Internal Medicine

## 2021-11-05 MED ORDER — PREDNISONE 10 MG (48) PO TBPK
ORAL_TABLET | ORAL | 0 refills | Status: DC
Start: 2021-11-05 — End: 2021-11-30

## 2021-11-10 ENCOUNTER — Telehealth: Payer: Self-pay

## 2021-11-10 NOTE — Chronic Care Management (AMB) (Signed)
Novo Nordisk patient assistance program notification:  120- day supply of Rybelsus 14 mg was filled on 10/27/2021 amd should arrive to the office in 10-14 business days. Patient has 3  refill remaining and enrollment will expire on 02/04/2022.  The next refill for patient will be fulfilled on 01/26/2022.   (11/06/2021- placed Rybelsus medication back in box and with return shipping label for UPS, left at pcp office for UPS pickup. )  Billee Cashing, CMA Clinical Pharmacist Assistant [phone]

## 2021-11-15 ENCOUNTER — Other Ambulatory Visit: Payer: Self-pay | Admitting: Internal Medicine

## 2021-11-23 ENCOUNTER — Telehealth: Payer: Self-pay

## 2021-11-23 NOTE — Chronic Care Management (AMB) (Signed)
Chronic Care Management Pharmacy Assistant   Name: Dawn Wood  MRN: [id number] DOB: [birth year]  Reason for Encounter: Disease State/ Diabetes  Recent office visits:  10-29-2021 Daneen Schick (CCM).  10-20-2021 Glendale Chard, MD. Medicare wellness visit. Abnormal UA. VAS [place] ABI WITH/WO TBI ordered. Referral placed to ENT and PREP.  09-22-2021 Glendale Chard, MD. Glucose= 137, Creatinine= 1.04, eGFR= 56. A1C= 7.0. Trig= 171, LDL= 113. START Gabapentin 100 mg One tab po qhs x 1 week, then 2 tabs po qhs.  Recent consult visits:  None  Hospital visits:  None in previous 6 months  Medications: Outpatient Encounter Medications as of 11/23/2021  Medication Sig   amLODipine (NORVASC) 5 MG tablet Take 1 tablet (5 mg total) by mouth daily.   BD PEN NEEDLE NANO 2ND GEN 32G X 4 MM MISC USE AS DIRECTED TWICE DAILY   Cholecalciferol (VITAMIN D3) 2000 units TABS Take 1 tablet by mouth daily.   clopidogrel (PLAVIX) 75 MG tablet Take 1 tablet (75 mg total) by mouth daily.   Coenzyme Q10 (CO Q 10) 10 MG CAPS Take by mouth daily.   colchicine 0.6 MG tablet Take 1 tablet by mouth daily. If you experience a flare, increase to 2 tablets by mouth daily.   Evolocumab (REPATHA SURECLICK) 381 MG/ML SOAJ Inject 1 Pen into the skin every 14 (fourteen) days.   famotidine (PEPCID) 20 MG tablet TAKE 1 TABLET(20 MG) BY MOUTH TWICE DAILY   fluticasone (FLONASE) 50 MCG/ACT nasal spray SHAKE LIQUID AND USE 1 SPRAY IN EACH NOSTRIL DAILY AS NEEDED FOR ALLERGIES OR RHINITIS   gabapentin (NEURONTIN) 100 MG capsule One tab po qhs x 1 week, then 2 tabs po qhs   lidocaine-prilocaine (EMLA) cream    meclizine (ANTIVERT) 12.5 MG tablet Take 1 tablet (12.5 mg total) by mouth 3 (three) times daily as needed for dizziness. (Patient not taking: Reported on 10/20/2021)   metoprolol succinate (TOPROL-XL) 100 MG 24 hr tablet TAKE 1 TABLET BY MOUTH EVERY DAY FOLLOWING MEALS   nitroGLYCERIN (NITROSTAT) 0.4 MG SL  tablet Place 1 tablet (0.4 mg total) under the tongue every 5 (five) minutes as needed for chest pain.   NOVOLOG MIX 70/30 FLEXPEN (70-30) 100 UNIT/ML FlexPen INJECT 40 UNITS SUBCUTANEOUS EVERY MORNING AND 45 UNITS EVERY NIGHT AT BEDTIME   olmesartan (BENICAR) 40 MG tablet TAKE 1 TABLET(40 MG) BY MOUTH DAILY   ONETOUCH VERIO test strip USE TO CHECK BLOOD SUGARS TWICE DAILY AS DIRECTED   predniSONE (STERAPRED UNI-PAK 48 TAB) 10 MG (48) TBPK tablet Use as directed, dispense as a six day dose pack   Semaglutide,0.25 or 0.5MG/DOS, (OZEMPIC, 0.25 OR 0.5 MG/DOSE,) 2 MG/1.5ML SOPN INJECT 0.5MG INTO SKIN ONCE A WEEK   spironolactone-hydrochlorothiazide (ALDACTAZIDE) 25-25 MG tablet Take 1 tablet by mouth daily.   No facility-administered encounter medications on file as of 11/23/2021.  Recent Relevant Labs: Lab Results  Component Value Date/Time   HGBA1C 7.0 (H) 09/22/2021 04:54 PM   HGBA1C 6.8 (H) 06/18/2021 03:37 PM   HGBA1C 6.5 10/03/2017 12:00 AM   MICROALBUR 10 02/16/2021 04:56 PM   MICROALBUR 10 10/04/2019 10:57 AM    Kidney Function Lab Results  Component Value Date/Time   CREATININE 1.04 (H) 09/22/2021 04:54 PM   CREATININE 1.06 (H) 06/18/2021 03:37 PM   CREATININE 0.94 (H) 11/29/2018 11:35 AM   CREATININE 0.82 05/29/2013 06:04 AM   CREATININE 1.21 05/26/2013 09:28 PM   GFR 85.00 12/02/2014 08:56 AM   GFRNONAA 46 (L)  02/11/2020 03:03 PM   GFRNONAA 60 11/29/2018 11:35 AM   GFRAA 53 (L) 02/11/2020 03:03 PM   GFRAA 69 11/29/2018 11:35 AM    Current antihyperglycemic regimen:  Ozempic 0.5 mg weekly Novolog 40 units every morning and 45 units at night.  What recent interventions/DTPs have been made to improve glycemic control:  -Educated on A1c and blood sugar goals; Complications of diabetes including kidney damage, retinal damage, and cardiovascular disease; -Counseled to check feet daily and get yearly eye exams -Recommended to continue current medication  Have there been any  recent hospitalizations or ED visits since last visit with CPP? No  Patient denies hypoglycemic symptoms  Patient denies hyperglycemic symptoms  How often are you checking your blood sugar? Daily  What are your blood sugars ranging?  Fasting: 09-18 134, 09-17 137, 09-16 146, 09-15 96 Before meals: None After meals: None Bedtime: None  During the week, how often does your blood glucose drop below 70? Never  Are you checking your feet daily/regularly? Daily  Adherence Review: Is the patient currently on a STATIN medication? No Is the patient currently on ACE/ARB medication? Yes Does the patient have >5 day gap between last estimated fill dates? No   Care Gaps: Shingrix overdue Covid booster overdue Flu vaccine overdue  Star Rating Drugs: Ozempic 0.5 mg- Patient assistance Olmesartan 40 mg- Last filled 11-17-2021 30 DS [place] Clinical Pharmacist Assistant [phone]

## 2021-11-24 ENCOUNTER — Telehealth: Payer: BC Managed Care – PPO

## 2021-11-29 NOTE — Progress Notes (Signed)
Cardiology Office Note:    Date:  11/30/2021   ID:  Dawn, Wood Aug 10, 1945, MRN [id number]  PCP:  Glendale Chard, MD  Cardiologist:  Sinclair Grooms, MD   Referring MD: Glendale Chard, MD   Chief Complaint  Patient presents with   Coronary Artery Disease   Congestive Heart Failure   Hyperlipidemia   Hypertension    History of Present Illness:    Dawn Wood is a 76 y.o. female with a hx of  CAD With prior LAD DE stent (2015 Dr. Chancy Milroy), hypertension, DM II, PAD, osteoarthritis and gouty arthritis, and hyperlipidemia.   She is retired from work.  Over the last 2 to 3 months she has had a couple episodes where the heart rate speeds up and she gets tight in the chest.  She will take a nitroglycerin and the room will resolve.  Prior to the last office visit in March, she was having discomfort in the chest and this led to a myocardial perfusion study that was low risk.  Please see below.  She is also complaining that she gets bilateral hip pain and leg weakness when she walks.  This was shared because she does not have exertional related chest discomfort or palpitations.  The episodes referenced above occur spontaneously.  Past Medical History:  Diagnosis Date   Coronary artery disease    Diabetes mellitus without complication (HCC)    GERD (gastroesophageal reflux disease)    Hypertension    MI (myocardial infarction) ([place])    Peripheral vascular disease ([place])     Past Surgical History:  Procedure Laterality Date   ABDOMINAL HYSTERECTOMY  1985   APPENDECTOMY     CATARACT EXTRACTION Left 02/20/2018   Dr. Venetia Maxon   CORONARY STENT PLACEMENT  2015   ectopic pregnancy--unilateral salpingectomy  1980   KNEE ARTHROSCOPY Left 03/2016   unilateral oophorectomy      Current Medications: Current Meds  Medication Sig   amLODipine (NORVASC) 5 MG tablet Take 1 tablet (5 mg total) by mouth daily.   BD PEN NEEDLE NANO 2ND GEN 32G X 4 MM MISC USE AS DIRECTED  TWICE DAILY   Cholecalciferol (VITAMIN D3) 2000 units TABS Take 1 tablet by mouth daily.   clopidogrel (PLAVIX) 75 MG tablet Take 1 tablet (75 mg total) by mouth daily.   Coenzyme Q10 (CO Q 10) 10 MG CAPS Take by mouth daily.   colchicine 0.6 MG tablet Take 1 tablet by mouth daily. If you experience a flare, increase to 2 tablets by mouth daily. (Patient taking differently: Take 0.6 mg by mouth as needed. Take 1 tablet by mouth daily. If you experience a flare, increase to 2 tablets by mouth daily.)   Evolocumab (REPATHA SURECLICK) 353 MG/ML SOAJ Inject 1 Pen into the skin every 14 (fourteen) days.   famotidine (PEPCID) 20 MG tablet TAKE 1 TABLET(20 MG) BY MOUTH TWICE DAILY   fluticasone (FLONASE) 50 MCG/ACT nasal spray SHAKE LIQUID AND USE 1 SPRAY IN EACH NOSTRIL DAILY AS NEEDED FOR ALLERGIES OR RHINITIS   lidocaine-prilocaine (EMLA) cream    metoprolol succinate (TOPROL-XL) 100 MG 24 hr tablet TAKE 1 TABLET BY MOUTH EVERY DAY FOLLOWING MEALS   nitroGLYCERIN (NITROSTAT) 0.4 MG SL tablet Place 1 tablet (0.4 mg total) under the tongue every 5 (five) minutes as needed for chest pain.   NOVOLOG MIX 70/30 FLEXPEN (70-30) 100 UNIT/ML FlexPen INJECT 40 UNITS SUBCUTANEOUS EVERY MORNING AND 45 UNITS EVERY NIGHT AT BEDTIME  olmesartan (BENICAR) 40 MG tablet TAKE 1 TABLET(40 MG) BY MOUTH DAILY   ONETOUCH VERIO test strip USE TO CHECK BLOOD SUGARS TWICE DAILY AS DIRECTED   Semaglutide,0.25 or 0.5MG /DOS, (OZEMPIC, 0.25 OR 0.5 MG/DOSE,) 2 MG/1.5ML SOPN INJECT 0.5MG  INTO SKIN ONCE A WEEK   spironolactone-hydrochlorothiazide (ALDACTAZIDE) 25-25 MG tablet Take 1 tablet by mouth daily.     Allergies:   Dust mite extract, Other, Statins, Allopurinol, and Pollen extract   Social History   Socioeconomic History   Marital status: Single    Spouse name: Not on file   Number of children: Not on file   Years of education: Not on file   Highest education level: Not on file  Occupational History   Not on file   Tobacco Use   Smoking status: Former    Packs/day: 0.25    Years: 45.00    Total pack years: 11.25    Types: Cigarettes    Quit date: 01/10/2013    Years since quitting: 8.8   Smokeless tobacco: Former  Building services engineer Use: Never used  Substance and Sexual Activity   Alcohol use: Not Currently    Alcohol/week: 0.0 standard drinks of alcohol   Drug use: No   Sexual activity: Not on file  Other Topics Concern   Not on file  Social History Narrative   Not on file   Social Determinants of Health   Financial Resource Strain: High Risk (08/11/2021)   Overall Financial Resource Strain (CARDIA)    Difficulty of Paying Living Expenses: Very hard  Food Insecurity: No Food Insecurity (10/29/2021)   Hunger Vital Sign    Worried About Running Out of Food in the Last Year: Never true    Ran Out of Food in the Last Year: Never true  Transportation Needs: No Transportation Needs (10/29/2021)   PRAPARE - Administrator, Civil Service (Medical): No    Lack of Transportation (Non-Medical): No  Physical Activity: Inactive (10/20/2021)   Exercise Vital Sign    Days of Exercise per Week: 0 days    Minutes of Exercise per Session: 0 min  Stress: No Stress Concern Present (10/20/2021)   [employer] of Occupational Health - Occupational Stress Questionnaire    Feeling of Stress : Only a little  Social Connections: Socially Isolated (10/20/2021)   Social Connection and Isolation Panel [NHANES]    Frequency of Communication with Friends and Family: More than three times a week    Frequency of Social Gatherings with Friends and Family: Once a week    Attends Religious Services: Never    Database administrator or Organizations: No    Attends Engineer, structural: Never    Marital Status: Never married     Family History: The patient's family history includes Aortic stenosis in her daughter; Breast cancer (age of onset: 110) in her sister; Cancer in her father; Heart  disease in her daughter; Hypertension in her father and mother; Thyroid disease in her daughter. There is no history of Sleep apnea.  ROS:   Please see the history of present illness.    No pain in her legs at rest. all other systems reviewed and are negative.  EKGs/Labs/Other Studies Reviewed:    The following studies were reviewed today: NUCLEAR PERFUSION 06/05/221: Study Highlights      The study is normal. The study is low risk.   No ST deviation was noted.   Left ventricular function is normal. Nuclear stress  EF: 69 %. The left ventricular ejection fraction is hyperdynamic (>65%). End diastolic cavity size is normal.   Prior study available for comparison from 10/22/1998.  EKG:  EKG not repeated.  Recent Labs: 09/22/2021: ALT 12; BUN 17; Creatinine, Ser 1.04; Hemoglobin 14.0; Platelets 288; Potassium 4.3; Sodium 142  Recent Lipid Panel    Component Value Date/Time   CHOL 192 09/22/2021 1654   CHOL 167 05/28/2013 0413   TRIG 171 (H) 09/22/2021 1654   TRIG 180 05/28/2013 0413   HDL 49 09/22/2021 1654   HDL 37 (L) 05/28/2013 0413   CHOLHDL 3.9 09/22/2021 1654   CHOLHDL 3 12/02/2014 0856   VLDL 22.0 12/02/2014 0856   VLDL 36 05/28/2013 0413   LDLCALC 113 (H) 09/22/2021 1654   LDLCALC 94 05/28/2013 0413   LDLDIRECT 83 06/11/2019 1425    Physical Exam:    VS:  BP 138/72   Pulse 71   Ht 5\' 5"  (1.651 m)   Wt 212 lb (96.2 kg)   SpO2 96%   BMI 35.28 kg/m     Wt Readings from Last 3 Encounters:  11/30/21 212 lb (96.2 kg)  10/20/21 210 lb 6.4 oz (95.4 kg)  09/22/21 210 lb 3.2 oz (95.3 kg)     GEN: Obese. No acute distress HEENT: Normal NECK: No JVD. LYMPHATICS: No lymphadenopathy CARDIAC: No murmur. RRR no gallop, or edema. VASCULAR: No pedal pulses are noted in either foot.. No bruits. RESPIRATORY:  Clear to auscultation without rales, wheezing or rhonchi  ABDOMEN: Soft, non-tender, non-distended, No pulsatile mass, MUSCULOSKELETAL: No deformity  SKIN: Warm  and dry NEUROLOGIC:  Alert and oriented x 3 PSYCHIATRIC:  Normal affect   ASSESSMENT:    1. CAD in native artery   2. Essential hypertension   3. PVD (peripheral vascular disease) (HCC)   4. Dyslipidemia   5. Uncontrolled type 2 diabetes mellitus with hyperglycemia (HCC)   6. Statin myopathy   7. Palpitations   8. Leg weakness, bilateral    PLAN:    In order of problems listed above:  Chest discomfort associated with palpitations relieved by nitroglycerin raises the question of progression of coronary disease or an arrhythmia in the milieu of moderate coronary disease.  Plan 2-week monitor.  If no atrial fibs or PSVT to account for the patient's symptoms, she will need to have coronary angiography.  She will have a follow-up visit in 4 to 6 weeks. Continue current therapy which is providing adequate blood pressure control. Bilateral exertion related hip discomfort.  Rule out PAD progression. Continue Repatha. SGLT2 therapy should be considered.   Overall, plan to do a 2-week monitor.  Hopefully she will have an episode of the discomfort during this timeframe to help 09/24/21 exclude A-fib or PSVT.  If she continues to have episodes of discomfort that are relieved by nitroglycerin, will consider coronary angiography when she returns for 1 month follow-up.   Medication Adjustments/Labs and Tests Ordered: Current medicines are reviewed at length with the patient today.  Concerns regarding medicines are outlined above.  Orders Placed This Encounter  Procedures   LONG TERM MONITOR (3-14 DAYS)   VAS [place] LOWER EXTREMITY ARTERIAL DUPLEX   No orders of the defined types were placed in this encounter.   Patient Instructions  Medication Instructions:  Your physician recommends that you continue on your current medications as directed. Please refer to the Current Medication list given to you today.  *If you need a refill on your cardiac medications before your  next appointment, please call  your pharmacy*   Testing/Procedures: Your physician has recommended that you wear an event monitor. Event monitors are medical devices that record the heart's electrical activity. Doctors most often [place] these monitors to diagnose arrhythmias. Arrhythmias are problems with the speed or rhythm of the heartbeat. The monitor is a small, portable device. You can wear one while you do your normal daily activities. This is usually used to diagnose what is causing palpitations/syncope (passing out).   Your physician has requested that you have a lower or upper extremity arterial duplex. This test is an ultrasound of the arteries in the legs or arms. It looks at arterial blood flow in the legs and arms. Allow one hour for Lower and Upper Arterial scans. There are no restrictions or special instructions    Follow-Up: At [facility], you and your health needs are our priority.  As part of our continuing mission to provide you with exceptional heart care, we have created designated Provider Care Teams.  These Care Teams include your primary Cardiologist (physician) and Advanced Practice Providers (APPs -  Physician Assistants and Nurse Practitioners) who all work together to provide you with the care you need, when you need it.  We recommend signing up for the patient portal called "MyChart".  Sign up information is provided on this After Visit Summary.  MyChart is used to connect with patients for Virtual Visits (Telemedicine).  Patients are able to view lab/test results, encounter notes, upcoming appointments, etc.  Non-urgent messages can be sent to your provider as well.   To learn more about what you can do with MyChart, go to [email or web address].    Your next appointment:   6 week(s)  The format for your next appointment:   In Person  Provider:   Lesleigh Noe, MD     Other Instructions Dawn Wood- Long Term Monitor Instructions  Your physician has requested you wear a ZIO patch  monitor for 14 days.  This is a single patch monitor. [employer] supplies one patch monitor per enrollment. Additional stickers are not available. Please do not apply patch if you will be having a Nuclear Stress Test,  Echocardiogram, Cardiac CT, MRI, or Chest Xray during the period you would be wearing the  monitor. The patch cannot be worn during these tests. You cannot remove and re-apply the  ZIO XT patch monitor.  Your ZIO patch monitor will be mailed 3 day USPS to your address on file. It may take 3-5 days  to receive your monitor after you have been enrolled.  Once you have received your monitor, please review the enclosed instructions. Your monitor  has already been registered assigning a specific monitor serial # to you.  Billing and Patient Assistance Program Information  We have supplied [employer] with any of your insurance information on file for billing purposes. [employer] offers a sliding scale Patient Assistance Program for patients that do not have  insurance, or whose insurance does not completely cover the cost of the ZIO monitor.  You must apply for the Patient Assistance Program to qualify for this discounted rate.  To apply, please call [employer] at [phone], select option 4, select option 2, ask to apply for  Patient Assistance Program. Dawn Wood will ask your household income, and how many people  are in your household. They will quote your out-of-pocket cost based on that information.  [employer] will also be able to set up a 58-month, interest-free payment plan if needed.  Applying the monitor   Shave hair from upper left chest.  Hold abrader disc by orange tab. Rub abrader in 40 strokes over the upper left chest as  indicated in your monitor instructions.  Clean area with 4 enclosed alcohol pads. Let dry.  Apply patch as indicated in monitor instructions. Patch will be placed under collarbone on left  side of chest with arrow pointing upward.  Rub patch adhesive wings  for 2 minutes. Remove white label marked "1". Remove the white  label marked "2". Rub patch adhesive wings for 2 additional minutes.  While looking in a mirror, press and release button in center of patch. A small green light will  flash 3-4 times. This will be your only indicator that the monitor has been turned on.  Do not shower for the first 24 hours. You may shower after the first 24 hours.  Press the button if you feel a symptom. You will hear a small click. Record Date, Time and  Symptom in the Patient Logbook.  When you are ready to remove the patch, follow instructions on the last 2 pages of Patient  Logbook. Stick patch monitor onto the last page of Patient Logbook.  Place Patient Logbook in the blue and white box. Use locking tab on box and tape box closed  securely. The blue and white box has prepaid postage on it. Please place it in the mailbox as  soon as possible. Your physician should have your test results approximately 7 days after the  monitor has been mailed back to [facility].  Call [facility] Customer Care at [phone] if you have questions regarding  your ZIO XT patch monitor. Call them immediately if you see an orange light blinking on your  monitor.  If your monitor falls off in less than 4 days, contact our Monitor department at [phone].  If your monitor becomes loose or falls off after 4 days call [employer] at [phone] for  suggestions on securing your monitor            Signed, Lesleigh NoeHenry W Jayda White III, MD  11/30/2021 12:35 PM    [facility]

## 2021-11-30 ENCOUNTER — Ambulatory Visit: Payer: BC Managed Care – PPO | Attending: Interventional Cardiology | Admitting: Interventional Cardiology

## 2021-11-30 ENCOUNTER — Ambulatory Visit: Payer: Medicare Other | Attending: Interventional Cardiology

## 2021-11-30 ENCOUNTER — Encounter: Payer: Self-pay | Admitting: Interventional Cardiology

## 2021-11-30 VITALS — BP 138/72 | HR 71 | Ht 65.0 in | Wt 212.0 lb

## 2021-11-30 DIAGNOSIS — E1165 Type 2 diabetes mellitus with hyperglycemia: Secondary | ICD-10-CM

## 2021-11-30 DIAGNOSIS — T466X5D Adverse effect of antihyperlipidemic and antiarteriosclerotic drugs, subsequent encounter: Secondary | ICD-10-CM

## 2021-11-30 DIAGNOSIS — R002 Palpitations: Secondary | ICD-10-CM | POA: Insufficient documentation

## 2021-11-30 DIAGNOSIS — I2510 Atherosclerotic heart disease of native coronary artery without angina pectoris: Secondary | ICD-10-CM | POA: Diagnosis not present

## 2021-11-30 DIAGNOSIS — E785 Hyperlipidemia, unspecified: Secondary | ICD-10-CM | POA: Diagnosis not present

## 2021-11-30 DIAGNOSIS — I739 Peripheral vascular disease, unspecified: Secondary | ICD-10-CM

## 2021-11-30 DIAGNOSIS — G720 Drug-induced myopathy: Secondary | ICD-10-CM

## 2021-11-30 DIAGNOSIS — I10 Essential (primary) hypertension: Secondary | ICD-10-CM | POA: Diagnosis not present

## 2021-11-30 DIAGNOSIS — R29898 Other symptoms and signs involving the musculoskeletal system: Secondary | ICD-10-CM

## 2021-11-30 NOTE — Progress Notes (Unsigned)
Enrolled for [employer] to mail a ZIO XT long term holter monitor to the patients address on file.  

## 2021-11-30 NOTE — Patient Instructions (Signed)
Medication Instructions:  Your physician recommends that you continue on your current medications as directed. Please refer to the Current Medication list given to you today.  *If you need a refill on your cardiac medications before your next appointment, please call your pharmacy*   Testing/Procedures: Your physician has recommended that you wear an event monitor. Event monitors are medical devices that record the heart's electrical activity. Doctors most often [place] these monitors to diagnose arrhythmias. Arrhythmias are problems with the speed or rhythm of the heartbeat. The monitor is a small, portable device. You can wear one while you do your normal daily activities. This is usually used to diagnose what is causing palpitations/syncope (passing out).   Your physician has requested that you have a lower or upper extremity arterial duplex. This test is an ultrasound of the arteries in the legs or arms. It looks at arterial blood flow in the legs and arms. Allow one hour for Lower and Upper Arterial scans. There are no restrictions or special instructions    Follow-Up: At [facility], you and your health needs are our priority.  As part of our continuing mission to provide you with exceptional heart care, we have created designated Provider Care Teams.  These Care Teams include your primary Cardiologist (physician) and Advanced Practice Providers (APPs -  Physician Assistants and Nurse Practitioners) who all work together to provide you with the care you need, when you need it.  We recommend signing up for the patient portal called "MyChart".  Sign up information is provided on this After Visit Summary.  MyChart is used to connect with patients for Virtual Visits (Telemedicine).  Patients are able to view lab/test results, encounter notes, upcoming appointments, etc.  Non-urgent messages can be sent to your provider as well.   To learn more about what you can do with MyChart, go to  [email or web address].    Your next appointment:   6 week(s)  The format for your next appointment:   In Person  Provider:   Sinclair Grooms, MD     Other Instructions [place] Monitor Instructions  Your physician has requested you wear a ZIO patch monitor for 14 days.  This is a single patch monitor. [employer] supplies one patch monitor per enrollment. Additional stickers are not available. Please do not apply patch if you will be having a Nuclear Stress Test,  Echocardiogram, Cardiac CT, MRI, or Chest Xray during the period you would be wearing the  monitor. The patch cannot be worn during these tests. You cannot remove and re-apply the  ZIO XT patch monitor.  Your ZIO patch monitor will be mailed 3 day USPS to your address on file. It may take 3-5 days  to receive your monitor after you have been enrolled.  Once you have received your monitor, please review the enclosed instructions. Your monitor  has already been registered assigning a specific monitor serial # to you.  Billing and Patient Assistance Program Information  We have supplied [employer] with any of your insurance information on file for billing purposes. [employer] offers a sliding scale Patient Assistance Program for patients that do not have  insurance, or whose insurance does not completely cover the cost of the ZIO monitor.  You must apply for the Patient Assistance Program to qualify for this discounted rate.  To apply, please call [employer] at [phone], select option 4, select option 2, ask to apply for  Patient Assistance Program. Theodore Demark will ask your household  income, and how many people  are in your household. They will quote your out-of-pocket cost based on that information.  [employer] will also be able to set up a 21-month, interest-free payment plan if needed.  Applying the monitor   Shave hair from upper left chest.  Hold abrader disc by orange tab. Rub abrader in 40 strokes over the upper  left chest as  indicated in your monitor instructions.  Clean area with 4 enclosed alcohol pads. Let dry.  Apply patch as indicated in monitor instructions. Patch will be placed under collarbone on left  side of chest with arrow pointing upward.  Rub patch adhesive wings for 2 minutes. Remove white label marked "1". Remove the white  label marked "2". Rub patch adhesive wings for 2 additional minutes.  While looking in a mirror, press and release button in center of patch. A small green light will  flash 3-4 times. This will be your only indicator that the monitor has been turned on.  Do not shower for the first 24 hours. You may shower after the first 24 hours.  Press the button if you feel a symptom. You will hear a small click. Record Date, Time and  Symptom in the Patient Logbook.  When you are ready to remove the patch, follow instructions on the last 2 pages of Patient  Logbook. Stick patch monitor onto the last page of Patient Logbook.  Place Patient Logbook in the blue and white box. Use locking tab on box and tape box closed  securely. The blue and white box has prepaid postage on it. Please place it in the mailbox as  soon as possible. Your physician should have your test results approximately 7 days after the  monitor has been mailed back to [facility].  Call [facility] Customer Care at [phone] if you have questions regarding  your ZIO XT patch monitor. Call them immediately if you see an orange light blinking on your  monitor.  If your monitor falls off in less than 4 days, contact our Monitor department at [phone].  If your monitor becomes loose or falls off after 4 days call [employer] at [phone] for  suggestions on securing your monitor

## 2021-12-03 ENCOUNTER — Other Ambulatory Visit: Payer: Self-pay | Admitting: Interventional Cardiology

## 2021-12-03 ENCOUNTER — Telehealth: Payer: Self-pay | Admitting: Interventional Cardiology

## 2021-12-03 DIAGNOSIS — I739 Peripheral vascular disease, unspecified: Secondary | ICD-10-CM

## 2021-12-03 DIAGNOSIS — R29898 Other symptoms and signs involving the musculoskeletal system: Secondary | ICD-10-CM

## 2021-12-03 NOTE — Telephone Encounter (Signed)
Patient would like to talk with Dr. Tamala Julian or nurse in regards to her zio monitor. Please call back to discuss

## 2021-12-03 NOTE — Telephone Encounter (Signed)
Patient had already made arrangements for her PCP's nurse to apply her ZIO XT monitor in the morning.

## 2021-12-04 DIAGNOSIS — R002 Palpitations: Secondary | ICD-10-CM | POA: Diagnosis not present

## 2021-12-11 ENCOUNTER — Ambulatory Visit (HOSPITAL_COMMUNITY)
Admission: RE | Admit: 2021-12-11 | Discharge: 2021-12-11 | Disposition: A | Discharge status: Home / Self Care | Payer: Medicare Other | Source: Ambulatory Visit | Attending: Interventional Cardiology | Admitting: Interventional Cardiology

## 2021-12-11 DIAGNOSIS — I739 Peripheral vascular disease, unspecified: Secondary | ICD-10-CM | POA: Insufficient documentation

## 2021-12-11 DIAGNOSIS — R29898 Other symptoms and signs involving the musculoskeletal system: Secondary | ICD-10-CM | POA: Insufficient documentation

## 2021-12-18 ENCOUNTER — Telehealth: Payer: Self-pay | Admitting: Interventional Cardiology

## 2021-12-18 NOTE — Telephone Encounter (Signed)
   Patient Name: Dawn Wood  DOB: 07/20/1945 MRN: [id number]  Primary Cardiologist: Sinclair Grooms, MD  Chart reviewed as part of pre-operative protocol coverage. Cataract extractions are recognized in guidelines as low risk surgeries that do not typically require specific preoperative testing or holding of blood thinner therapy. Therefore, given past medical history and time since last visit, based on ACC/AHA guidelines, [place] would be at acceptable risk for the planned procedure without further cardiovascular testing.   I will route this recommendation to the requesting party via Epic fax function and remove from pre-op pool.  Please call with questions.  Elgie Collard, PA-C 12/18/2021, 4:26 PM

## 2021-12-18 NOTE — Telephone Encounter (Signed)
   Pre-operative Risk Assessment    Patient Name: Dawn Wood  DOB: 31-Dec-1945 MRN: [id number]      Request for Surgical Clearance    Procedure:   Right eye cataract surgery  Date of Surgery:  Clearance 12/21/21                                 Surgeon:  Dr. Venetia Maxon Surgeon's Group or Practice Name:  [facility] Dr. Venetia Maxon Phone number:  [phone] Fax number:  [phone]   Type of Clearance Requested:   - Medical  - Pharmacy:  Hold TBD  by cardiology   Type of Anesthesia:   Unknown   Additional requests/questions:    Crist Infante   12/18/2021, 1:39 PM

## 2021-12-23 ENCOUNTER — Telehealth: Payer: Self-pay

## 2021-12-23 NOTE — Chronic Care Management (AMB) (Signed)
Novo Nordisk patient assistance program notification:  120- day supply of Ozempic 0.25/0.5 mg was filled on 12/01/2021 and should arrive to the office in 10-14 business days. Patient has 1  refill remaining and enrollment will expire on 03/07/2022.  No further action, patient due for 2024 re-enrollment.   Pattricia Boss, [place] Pharmacist Assistant [phone]

## 2021-12-28 ENCOUNTER — Telehealth: Payer: Self-pay

## 2021-12-28 ENCOUNTER — Ambulatory Visit: Payer: Medicare Other | Admitting: Internal Medicine

## 2021-12-28 NOTE — Telephone Encounter (Signed)
Call to pt to offer next PREP class starting on 10/30. Can't start then. Offered to recall her for the NOV class, pt is agreeable to being offered again

## 2021-12-30 ENCOUNTER — Ambulatory Visit (INDEPENDENT_AMBULATORY_CARE_PROVIDER_SITE_OTHER): Payer: Medicare Other | Admitting: Internal Medicine

## 2021-12-30 ENCOUNTER — Encounter: Payer: Self-pay | Admitting: Internal Medicine

## 2021-12-30 VITALS — BP 122/80 | HR 65 | Temp 98.6°F | Ht 64.2 in | Wt 207.4 lb

## 2021-12-30 DIAGNOSIS — Z6835 Body mass index (BMI) 35.0-35.9, adult: Secondary | ICD-10-CM

## 2021-12-30 DIAGNOSIS — Z794 Long term (current) use of insulin: Secondary | ICD-10-CM

## 2021-12-30 DIAGNOSIS — E1122 Type 2 diabetes mellitus with diabetic chronic kidney disease: Secondary | ICD-10-CM

## 2021-12-30 DIAGNOSIS — N1831 Chronic kidney disease, stage 3a: Secondary | ICD-10-CM

## 2021-12-30 DIAGNOSIS — I1310 Hypertensive heart and chronic kidney disease without heart failure, with stage 1 through stage 4 chronic kidney disease, or unspecified chronic kidney disease: Secondary | ICD-10-CM | POA: Diagnosis not present

## 2021-12-30 DIAGNOSIS — Z23 Encounter for immunization: Secondary | ICD-10-CM | POA: Diagnosis not present

## 2021-12-30 DIAGNOSIS — I2510 Atherosclerotic heart disease of native coronary artery without angina pectoris: Secondary | ICD-10-CM | POA: Diagnosis not present

## 2021-12-30 NOTE — Progress Notes (Signed)
Rich Brave [place],acting as a Education administrator for [name], MD.,have documented all relevant documentation on the behalf of [name], MD,as directed by  [name], MD while in the presence of [name], MD.    Subjective:     Patient ID: Dawn Wood , female    DOB: 1945-05-02 , 76 y.o.   MRN: [id number]   Chief Complaint  Patient presents with   Diabetes   Hypertension    HPI  Patient presents today for a HTN/DM check. She reports compliance with meds. She denies headaches, chest pain and shortness of breath.  She is overwhelmed with the care of her elderly Mother. She has found this to be stressful.   Diabetes She presents for her follow-up diabetic visit. She has type 2 diabetes mellitus. Her disease course has been stable. There are no hypoglycemic associated symptoms. Pertinent negatives for diabetes include no blurred vision, no polydipsia, no polyphagia and no polyuria. There are no hypoglycemic complications. Diabetic complications include nephropathy. Risk factors for coronary artery disease include diabetes mellitus, dyslipidemia, hypertension, obesity, sedentary lifestyle and post-menopausal. Current diabetic treatment includes insulin injections. She is following a diabetic diet. She participates in exercise intermittently. Her home blood glucose trend is fluctuating minimally. Her breakfast blood glucose is taken between 8-9 am. Her breakfast blood glucose range is generally 110-130 mg/dl. Eye exam is current.  Hypertension This is a chronic problem. The current episode started more than 1 year ago. The problem has been gradually improving since onset. Pertinent negatives include no blurred vision. Risk factors for coronary artery disease include diabetes mellitus, dyslipidemia, sedentary lifestyle and post-menopausal state. Hypertensive end-organ damage includes kidney disease.     Past Medical History:  Diagnosis Date   Coronary artery disease     Diabetes mellitus without complication (HCC)    GERD (gastroesophageal reflux disease)    Hypertension    MI (myocardial infarction) ([place])    Peripheral vascular disease ([place])      Family History  Problem Relation Age of Onset   Hypertension Mother    Cancer Father    Hypertension Father    Breast cancer Sister 44   Aortic stenosis Daughter    Heart disease Daughter        before age 79   Thyroid disease Daughter    Sleep apnea Neg Hx      Current Outpatient Medications:    amLODipine (NORVASC) 5 MG tablet, Take 1 tablet (5 mg total) by mouth daily., Disp: 90 tablet, Rfl: 3   BD PEN NEEDLE NANO 2ND GEN 32G X 4 MM MISC, USE AS DIRECTED TWICE DAILY, Disp: 100 each, Rfl: 2   Cholecalciferol (VITAMIN D3) 2000 units TABS, Take 1 tablet by mouth daily., Disp: , Rfl:    clopidogrel (PLAVIX) 75 MG tablet, Take 1 tablet (75 mg total) by mouth daily., Disp: 90 tablet, Rfl: 3   Coenzyme Q10 (CO Q 10) 10 MG CAPS, Take by mouth daily., Disp: , Rfl:    colchicine 0.6 MG tablet, Take 1 tablet by mouth daily. If you experience a flare, increase to 2 tablets by mouth daily. (Patient taking differently: Take 0.6 mg by mouth as needed. Take 1 tablet by mouth daily. If you experience a flare, increase to 2 tablets by mouth daily.), Disp: 60 tablet, Rfl: 2   Evolocumab (REPATHA SURECLICK) 093 MG/ML SOAJ, Inject 1 Pen into the skin every 14 (fourteen) days., Disp: 2 mL, Rfl: 11   famotidine (PEPCID)  20 MG tablet, TAKE 1 TABLET(20 MG) BY MOUTH TWICE DAILY, Disp: 180 tablet, Rfl: 2   fluticasone (FLONASE) 50 MCG/ACT nasal spray, SHAKE LIQUID AND USE 1 SPRAY IN EACH NOSTRIL DAILY AS NEEDED FOR ALLERGIES OR RHINITIS, Disp: 16 g, Rfl: 2   gabapentin (NEURONTIN) 100 MG capsule, One tab po qhs x 1 week, then 2 tabs po qhs (Patient not taking: Reported on 11/30/2021), Disp: 180 capsule, Rfl: 0   lidocaine-prilocaine (EMLA) cream, , Disp: , Rfl:    metoprolol succinate (TOPROL-XL) 100 MG 24 hr tablet, TAKE 1  TABLET BY MOUTH EVERY DAY FOLLOWING MEALS, Disp: 90 tablet, Rfl: 1   nitroGLYCERIN (NITROSTAT) 0.4 MG SL tablet, Place 1 tablet (0.4 mg total) under the tongue every 5 (five) minutes as needed for chest pain., Disp: 25 tablet, Rfl: 3   NOVOLOG MIX 70/30 FLEXPEN (70-30) 100 UNIT/ML FlexPen, INJECT 40 UNITS SUBCUTANEOUS EVERY MORNING AND 45 UNITS EVERY NIGHT AT BEDTIME, Disp: 75 mL, Rfl: 2   olmesartan (BENICAR) 40 MG tablet, TAKE 1 TABLET(40 MG) BY MOUTH DAILY, Disp: 90 tablet, Rfl: 2   ONETOUCH VERIO test strip, USE TO CHECK BLOOD SUGARS TWICE DAILY AS DIRECTED, Disp: 100 strip, Rfl: 3   Semaglutide,0.25 or 0.5MG/DOS, (OZEMPIC, 0.25 OR 0.5 MG/DOSE,) 2 MG/1.5ML SOPN, INJECT 0.5MG INTO SKIN ONCE A WEEK, Disp: 4.5 mL, Rfl: 3   spironolactone-hydrochlorothiazide (ALDACTAZIDE) 25-25 MG tablet, Take 1 tablet by mouth daily., Disp: 90 tablet, Rfl: 3   Allergies  Allergen Reactions   Dust Mite Extract Other (See Comments)    REACTION: SNEEZING   Other Other (See Comments)    ALLERGEN: DEODORIZERS SPRAY AND PERFUMES REACTION: SNEEZING   Statins Other (See Comments)    Muscle aches with rosuvastatin 5 mg daily and 3x/weekly, atorvastatin 20 mg daily and 3x/week, simvastatin 40 mg daily    Allopurinol     Diarrhea and the pt said it made her sugars low   Pollen Extract Other (See Comments)    Reaction unknown     Review of Systems  Constitutional: Negative.   Eyes:  Negative for blurred vision.  Respiratory: Negative.    Cardiovascular: Negative.   Endocrine: Negative for polydipsia, polyphagia and polyuria.  Musculoskeletal: Negative.   Skin: Negative.   Neurological: Negative.   Psychiatric/Behavioral: Negative.       Today's Vitals   12/30/21 1128  BP: 122/80  Pulse: 65  Temp: 98.6 F (37 C)  Weight: 207 lb 6.4 oz (94.1 kg)  Height: 5' 4.2" (1.631 m)  PainSc: 0-No pain   Body mass index is 35.38 kg/m.  Wt Readings from Last 3 Encounters:  12/30/21 207 lb 6.4 oz (94.1 kg)   11/30/21 212 lb (96.2 kg)  10/20/21 210 lb 6.4 oz (95.4 kg)     Objective:  Physical Exam Vitals and nursing note reviewed.  Constitutional:      Appearance: Normal appearance.  HENT:     Head: Normocephalic and atraumatic.     Nose:     Comments: Masked     Mouth/Throat:     Comments: Masked  Eyes:     Extraocular Movements: Extraocular movements intact.  Cardiovascular:     Rate and Rhythm: Normal rate and regular rhythm.     Heart sounds: Normal heart sounds.  Pulmonary:     Effort: Pulmonary effort is normal.     Breath sounds: Normal breath sounds.  Musculoskeletal:     Cervical back: Normal range of motion.  Skin:  General: Skin is warm.  Neurological:     General: No focal deficit present.     Mental Status: She is alert.  Psychiatric:        Mood and Affect: Mood normal.        Behavior: Behavior normal.       Assessment And Plan:     1. Type 2 diabetes mellitus with stage 3a chronic kidney disease, with long-term current use of insulin (HCC) Comments: Chronic, I will check labs as below. Importance of dietary/medication compliance was discussed with the patient.  - BMP8+eGFR - Hemoglobin A1c  2. Hypertensive heart and renal disease with renal failure, stage 1 through stage 4 or unspecified chronic kidney disease, without heart failure Comments: Chronic, well controlled. She will c/w olmesartan 55m daily, amlodipine 520mand metoprolol succinate 10069maily. Advised to follow low sodium diet.   3. Class 2 severe obesity due to excess calories with serious comorbidity and body mass index (BMI) of 35.0 to 35.9 in adult ([facility]: She is encouraged to aim for at least 150 minutes of exercise per week. Encouraged to participate in the PREP program.  4. Immunization due - Flu Vaccine QUAD High Dose(Fluad)   Patient was given opportunity to ask questions. Patient verbalized understanding of the plan and was able to repeat key elements of the plan. All  questions were answered to their satisfaction.   I, RobMaximino GreenlandD, have reviewed all documentation for this visit. The documentation on 12/30/21 for the exam, diagnosis, procedures, and orders are all accurate and complete.   IF YOU HAVE BEEN REFERRED TO A SPECIALIST, IT MAY TAKE 1-2 WEEKS TO SCHEDULE/PROCESS THE REFERRAL. IF YOU HAVE NOT HEARD FROM US/SPECIALIST IN TWO WEEKS, PLEASE GIVE US [place] AT [phone] X [phone].   THE PATIENT IS ENCOURAGED TO PRACTICE SOCIAL DISTANCING DUE TO THE COVID-19 PANDEMIC.

## 2021-12-30 NOTE — Patient Instructions (Signed)

## 2021-12-31 ENCOUNTER — Other Ambulatory Visit: Payer: Self-pay | Admitting: Internal Medicine

## 2021-12-31 LAB — BMP8+EGFR
BUN/Creatinine Ratio: 23 (ref 12–28)
BUN: 27 mg/dL (ref 8–27)
CO2: 22 mmol/L (ref 20–29)
Calcium: 9.6 mg/dL (ref 8.7–10.3)
Chloride: 104 mmol/L (ref 96–106)
Creatinine, Ser: 1.17 mg/dL — ABNORMAL HIGH (ref 0.57–1.00)
Glucose: 137 mg/dL — ABNORMAL HIGH (ref 70–99)
Potassium: 4 mmol/L (ref 3.5–5.2)
Sodium: 141 mmol/L (ref 134–144)
eGFR: 48 mL/min/{1.73_m2} — ABNORMAL LOW (ref >59)

## 2021-12-31 LAB — HEMOGLOBIN A1C
Est. average glucose Bld gHb Est-mCnc: 140 mg/dL
Hgb A1c MFr Bld: 6.5 % — ABNORMAL HIGH (ref 4.8–5.6)

## 2022-01-05 ENCOUNTER — Other Ambulatory Visit: Payer: Self-pay

## 2022-01-05 MED ORDER — AMLODIPINE BESYLATE 5 MG PO TABS: 5.0000 mg | ORAL_TABLET | Freq: Every day | ORAL | 3 refills | Status: DC

## 2022-01-07 ENCOUNTER — Telehealth: Payer: Self-pay | Admitting: Interventional Cardiology

## 2022-01-07 MED ORDER — METOPROLOL SUCCINATE ER 100 MG PO TB24: ORAL_TABLET | ORAL | 3 refills | Status: DC

## 2022-01-07 NOTE — Telephone Encounter (Signed)
-----   Message from Belva Crome, MD sent at 01/03/2022  3:06 PM EDT ----- Let the patient know she is having frequent SVT and rare atrial fibrillation. Increase metoprolol succinate to 100 mg AM and 50 mg PM. Inquire about sleep apnea and whether she has been tested? If not, she needs a sleep study. It could be that OSA is causing the arrhythmia. A copy will be sent to Stacie Glaze, DO

## 2022-01-07 NOTE — Telephone Encounter (Signed)
Spoke with patient and discussed heart monitor results.  Per Dr. Tamala Julian: Let the patient know she is having frequent SVT and rare atrial fibrillation. Increase metoprolol succinate to 100 mg AM and 50 mg PM. Inquire about sleep apnea and whether she has been tested? If not, she needs a sleep study. It could be that OSA is causing the arrhythmia.  Patient states she had a sleep study about 2 years ago and is scheduling an appt to initiate her CPAP. She is not sure if she will need repeat sleep study before CPAP initiation since it has been 2 years since her last sleep study.  Patient has appt with Dr. Tamala Julian on 01/11/22 and will discuss further at this appt.  Medication list updated with new dose of Toprol XL 100mg  in the AM and 50mg  in the PM.  Patient verbalized understanding of the above.

## 2022-01-09 NOTE — Progress Notes (Unsigned)
Cardiology Office Note:    Date:  01/11/2022   ID:  Sunoco, DOB Mar 06, 1946, MRN [id number]  PCP:  Glendale Chard, MD  Cardiologist:  Sinclair Grooms, MD   Referring MD: [place] Chard, MD   Chief Complaint  Patient presents with   Atrial Fibrillation   Coronary Artery Disease   Follow-up    PAD    History of Present Illness:    Dawn Wood is a 76 y.o. female with a hx of  CAD With prior LAD DE stent (2015 Dr. Chancy Milroy), hypertension, DM II, PAD, osteoarthritis and gouty arthritis, and hyperlipidemia.    Symptoms of irregular feelings in the chest related to atrial fibrillation based on a recent monitor.  Please see before.  No angina or sublingual nitroglycerin use required.  Past Medical History:  Diagnosis Date   Coronary artery disease    Diabetes mellitus without complication (HCC)    GERD (gastroesophageal reflux disease)    Hypertension    MI (myocardial infarction) ([place])    Peripheral vascular disease ([place])     Past Surgical History:  Procedure Laterality Date   ABDOMINAL HYSTERECTOMY  1985   APPENDECTOMY     CATARACT EXTRACTION Left 02/20/2018   Dr. Venetia Maxon   CORONARY STENT PLACEMENT  2015   ectopic pregnancy--unilateral salpingectomy  1980   KNEE ARTHROSCOPY Left 03/2016   unilateral oophorectomy      Current Medications: Current Meds  Medication Sig   amLODipine (NORVASC) 5 MG tablet Take 1 tablet (5 mg total) by mouth daily.   apixaban (ELIQUIS) 5 MG TABS tablet Take 1 tablet (5 mg total) by mouth 2 (two) times daily.   BD PEN NEEDLE NANO 2ND GEN 32G X 4 MM MISC USE AS DIRECTED TWICE DAILY   Cholecalciferol (VITAMIN D3) 2000 units TABS Take 1 tablet by mouth daily.   Coenzyme Q10 (CO Q 10) 10 MG CAPS Take by mouth daily.   colchicine 0.6 MG tablet Take 1 tablet by mouth daily. If you experience a flare, increase to 2 tablets by mouth daily. (Patient taking differently: Take 0.6 mg by mouth as needed. Take 1 tablet by mouth  daily. If you experience a flare, increase to 2 tablets by mouth daily.)   Evolocumab (REPATHA SURECLICK) 993 MG/ML SOAJ Inject 1 Pen into the skin every 14 (fourteen) days.   famotidine (PEPCID) 20 MG tablet TAKE 1 TABLET(20 MG) BY MOUTH TWICE DAILY   fluticasone (FLONASE) 50 MCG/ACT nasal spray SHAKE LIQUID AND USE 1 SPRAY IN EACH NOSTRIL DAILY AS NEEDED FOR ALLERGIES OR RHINITIS   gabapentin (NEURONTIN) 100 MG capsule One tab po qhs x 1 week, then 2 tabs po qhs   lidocaine-prilocaine (EMLA) cream    metoprolol succinate (TOPROL-XL) 100 MG 24 hr tablet Take 1 tablet (100 mg total) by mouth daily. Take with or immediately following a meal.   nitroGLYCERIN (NITROSTAT) 0.4 MG SL tablet Place 1 tablet (0.4 mg total) under the tongue every 5 (five) minutes as needed for chest pain.   NOVOLOG MIX 70/30 FLEXPEN (70-30) 100 UNIT/ML FlexPen INJECT 40 UNITS SUBCUTANEOUS EVERY MORNING AND 45 UNITS EVERY NIGHT AT BEDTIME   olmesartan (BENICAR) 40 MG tablet TAKE 1 TABLET(40 MG) BY MOUTH DAILY   ONETOUCH VERIO test strip USE TO CHECK BLOOD SUGARS TWICE DAILY AS DIRECTED   Semaglutide,0.25 or 0.5MG /DOS, (OZEMPIC, 0.25 OR 0.5 MG/DOSE,) 2 MG/1.5ML SOPN INJECT 0.5MG  INTO SKIN ONCE A WEEK   spironolactone-hydrochlorothiazide (ALDACTAZIDE) 25-25 MG tablet Take  1 tablet by mouth daily.   [DISCONTINUED] clopidogrel (PLAVIX) 75 MG tablet Take 1 tablet (75 mg total) by mouth daily.   [DISCONTINUED] metoprolol succinate (TOPROL-XL) 100 MG 24 hr tablet Take 1 tablet (100 mg total) by mouth in the morning AND 0.5 tablets (50 mg total) every evening. Take with or immediately following a meal..     Allergies:   Dust mite extract, Other, Statins, Allopurinol, and Pollen extract   Social History   Socioeconomic History   Marital status: Single    Spouse name: Not on file   Number of children: Not on file   Years of education: Not on file   Highest education level: Not on file  Occupational History   Not on file   Tobacco Use   Smoking status: Former    Packs/day: 0.25    Years: 45.00    Total pack years: 11.25    Types: Cigarettes    Quit date: 01/10/2013    Years since quitting: 9.0   Smokeless tobacco: Former  Scientific laboratory technician Use: Never used  Substance and Sexual Activity   Alcohol use: Not Currently    Alcohol/week: 0.0 standard drinks of alcohol   Drug use: No   Sexual activity: Not on file  Other Topics Concern   Not on file  Social History Narrative   Not on file   Social Determinants of Health   Financial Resource Strain: High Risk (08/11/2021)   Overall Financial Resource Strain (CARDIA)    Difficulty of Paying Living Expenses: Very hard  Food Insecurity: No Food Insecurity (10/29/2021)   Hunger Vital Sign    Worried About Running Out of Food in the Last Year: Never true    [place] in the Last Year: Never true  Transportation Needs: No Transportation Needs (10/29/2021)   PRAPARE - Hydrologist (Medical): No    Lack of Transportation (Non-Medical): No  Physical Activity: Inactive (10/20/2021)   Exercise Vital Sign    Days of Exercise per Week: 0 days    Minutes of Exercise per Session: 0 min  Stress: No Stress Concern Present (10/20/2021)   [place]    Feeling of Stress : Only a little  Social Connections: Socially Isolated (10/20/2021)   Social Connection and Isolation Panel [NHANES]    Frequency of Communication with Friends and Family: More than three times a week    Frequency of Social Gatherings with Friends and Family: Once a week    Attends Religious Services: Never    Marine scientist or Organizations: No    Attends Music therapist: Never    Marital Status: Never married     Family History: The patient's family history includes Aortic stenosis in her daughter; Breast cancer (age of onset: 31) in her sister; Cancer in her father; Heart  disease in her daughter; Hypertension in her father and mother; Thyroid disease in her daughter. There is no history of Sleep apnea.  ROS:   Please see the history of present illness.    No specific complaints all other systems reviewed and are negative.  EKGs/Labs/Other Studies Reviewed:    The following studies were reviewed today: Long term monitor 12/29/2021: Study Highlights      Basic rhythm is NSR with average HR 77 bpm   104 SVT runs with longest 1.5 minutes   Atrial fibrillation burden < 1% with longest lasting 13.5  minutes at average HR 148 bpm   One symptomatic episode correlated with AF     EKG:  EKG not repeated  Recent Labs: 09/22/2021: ALT 12; Hemoglobin 14.0; Platelets 288 12/30/2021: BUN 27; Creatinine, Ser 1.17; Potassium 4.0; Sodium 141  Recent Lipid Panel    Component Value Date/Time   CHOL 192 09/22/2021 1654   CHOL 167 05/28/2013 0413   TRIG 171 (H) 09/22/2021 1654   TRIG 180 05/28/2013 0413   HDL 49 09/22/2021 1654   HDL 37 (L) 05/28/2013 0413   CHOLHDL 3.9 09/22/2021 1654   CHOLHDL 3 12/02/2014 0856   VLDL 22.0 12/02/2014 0856   VLDL 36 05/28/2013 0413   LDLCALC 113 (H) 09/22/2021 1654   LDLCALC 94 05/28/2013 0413   LDLDIRECT 83 06/11/2019 1425    Physical Exam:    VS:  BP 138/62   Pulse 61   Ht 5' 4.2" (1.631 m)   Wt 211 lb 12.8 oz (96.1 kg)   SpO2 98%   BMI 36.13 kg/m     Wt Readings from Last 3 Encounters:  01/11/22 211 lb 12.8 oz (96.1 kg)  12/30/21 207 lb 6.4 oz (94.1 kg)  11/30/21 212 lb (96.2 kg)     GEN: Obese. No acute distress HEENT: Normal NECK: No JVD. LYMPHATICS: No lymphadenopathy CARDIAC: No murmur. RRR no gallop, or edema. VASCULAR:  Normal Pulses. No bruits. RESPIRATORY:  Clear to auscultation without rales, wheezing or rhonchi  ABDOMEN: Soft, non-tender, non-distended, No pulsatile mass, MUSCULOSKELETAL: No deformity  SKIN: Warm and dry NEUROLOGIC:  Alert and oriented x 3 PSYCHIATRIC:  Normal affect    ASSESSMENT:    1. Paroxysmal atrial fibrillation (HCC)   2. CAD in native artery   3. Obstructive sleep apnea   4. PAD (peripheral artery disease) ([place])   5. Essential hypertension   6. Dyslipidemia   7. Uncontrolled type 2 diabetes mellitus with hyperglycemia (HCC)    PLAN:    In order of problems listed above:  Fibrillation with burden less than 1% identified.  Longest episode lasted approximately 15 minutes.  Start Eliquis 5 mg p.o. twice daily Stable without angina.  Complaints that I was concerned about on the last visit happened to be related to atrial fibrillation.  She is on chronic Plavix therapy.  We will stop Plavix and start Eliquis. Sleep apnea was diagnosed 2 years ago but she never followed through on therapy.  It is a must that she go back to Dr. Rexene Alberts to be reevaluated and get started on therapy. Secondary prevention reviewed Amlodipine 5 mg per day, Toprol-XL 100 mg/day, Benicar, and Aldactazide. Continue Repatha Low-fat diet and exercise recommended   Overall education and awareness concerning primary/secondary risk prevention was discussed in detail: LDL less than 70, hemoglobin A1c less than 7, blood pressure target less than 130/80 mmHg, >150 minutes of moderate aerobic activity per week, avoidance of smoking, weight control (via diet and exercise), and continued surveillance/management of/for obstructive sleep apnea.    Medication Adjustments/Labs and Tests Ordered: Current medicines are reviewed at length with the patient today.  Concerns regarding medicines are outlined above.  No orders of the defined types were placed in this encounter.  Meds ordered this encounter  Medications   metoprolol succinate (TOPROL-XL) 100 MG 24 hr tablet    Sig: Take 1 tablet (100 mg total) by mouth daily. Take with or immediately following a meal.    Dose change.   apixaban (ELIQUIS) 5 MG TABS tablet    Sig:  Take 1 tablet (5 mg total) by mouth 2 (two) times daily.     Dispense:  60 tablet    Refill:  11    Patient Instructions  Medication Instructions:  Your physician has recommended you make the following change in your medication:   1) STOP clopidogrel (Plavix) 2) START apixaban (Eliquis) 5mg  twice daily 3) Metoprolol succinate (Toprol XL) was changed back to 100mg  daily  *If you need a refill on your cardiac medications before your next appointment, please call your pharmacy*  Lab Work: NONE  Testing/Procedures: NONE  Follow-Up: At [facility], you and your health needs are our priority.  As part of our continuing mission to provide you with exceptional heart care, we have created designated Provider Care Teams.  These Care Teams include your primary Cardiologist (physician) and Advanced Practice Providers (APPs -  Physician Assistants and Nurse Practitioners) who all work together to provide you with the care you need, when you need it.  Your next appointment:   6 month(s)  The format for your next appointment:   In Person  Provider:   Candee Furbish, MD  Other Instructions Please follow-up with Dr. Rexene Alberts to reevaluate sleep apnea and get started on treatment.  Important Information About Sugar         Signed, Sinclair Grooms, MD  01/11/2022 4:50 PM    [facility]

## 2022-01-11 ENCOUNTER — Encounter: Payer: Self-pay | Admitting: Interventional Cardiology

## 2022-01-11 ENCOUNTER — Ambulatory Visit: Payer: BC Managed Care – PPO | Attending: Interventional Cardiology | Admitting: Interventional Cardiology

## 2022-01-11 VITALS — BP 138/62 | HR 61 | Ht 64.2 in | Wt 211.8 lb

## 2022-01-11 DIAGNOSIS — I739 Peripheral vascular disease, unspecified: Secondary | ICD-10-CM

## 2022-01-11 DIAGNOSIS — I10 Essential (primary) hypertension: Secondary | ICD-10-CM

## 2022-01-11 DIAGNOSIS — E785 Hyperlipidemia, unspecified: Secondary | ICD-10-CM

## 2022-01-11 DIAGNOSIS — R002 Palpitations: Secondary | ICD-10-CM

## 2022-01-11 DIAGNOSIS — G4733 Obstructive sleep apnea (adult) (pediatric): Secondary | ICD-10-CM

## 2022-01-11 DIAGNOSIS — E1165 Type 2 diabetes mellitus with hyperglycemia: Secondary | ICD-10-CM

## 2022-01-11 DIAGNOSIS — I480 Paroxysmal atrial fibrillation: Secondary | ICD-10-CM | POA: Diagnosis not present

## 2022-01-11 DIAGNOSIS — I2510 Atherosclerotic heart disease of native coronary artery without angina pectoris: Secondary | ICD-10-CM | POA: Diagnosis not present

## 2022-01-11 MED ORDER — METOPROLOL SUCCINATE ER 100 MG PO TB24: 100.0000 mg | ORAL_TABLET | Freq: Every day | ORAL | Status: DC

## 2022-01-11 MED ORDER — APIXABAN 5 MG PO TABS: 5.0000 mg | ORAL_TABLET | Freq: Two times a day (BID) | ORAL | 11 refills | Status: DC

## 2022-01-11 NOTE — Patient Instructions (Signed)
Medication Instructions:  Your physician has recommended you make the following change in your medication:   1) STOP clopidogrel (Plavix) 2) START apixaban (Eliquis) 5mg  twice daily 3) Metoprolol succinate (Toprol XL) was changed back to 100mg  daily  *If you need a refill on your cardiac medications before your next appointment, please call your pharmacy*  Lab Work: NONE  Testing/Procedures: NONE  Follow-Up: At [facility], you and your health needs are our priority.  As part of our continuing mission to provide you with exceptional heart care, we have created designated Provider Care Teams.  These Care Teams include your primary Cardiologist (physician) and Advanced Practice Providers (APPs -  Physician Assistants and Nurse Practitioners) who all work together to provide you with the care you need, when you need it.  Your next appointment:   6 month(s)  The format for your next appointment:   In Person  Provider:   Candee Furbish, MD  Other Instructions Please follow-up with Dr. Rexene Alberts to reevaluate sleep apnea and get started on treatment.  Important Information About Sugar

## 2022-01-12 ENCOUNTER — Telehealth: Payer: Self-pay | Admitting: Interventional Cardiology

## 2022-01-12 NOTE — Telephone Encounter (Signed)
Called and spoke with Whitney at Dr. Gertie Exon office to make them aware that Dr. Tamala Julian discontinued Plavix and started Eliquis 5mg  BID at office visit yesterday.   Whitney verbalized understanding and expressed appreciation for call.

## 2022-01-13 ENCOUNTER — Telehealth: Payer: Self-pay | Admitting: Interventional Cardiology

## 2022-01-13 ENCOUNTER — Telehealth: Payer: Self-pay | Admitting: Neurology

## 2022-01-13 DIAGNOSIS — G4733 Obstructive sleep apnea (adult) (pediatric): Secondary | ICD-10-CM

## 2022-01-13 NOTE — Telephone Encounter (Signed)
Returned call to patient.  Patient states her cataract surgery with Dr. Harlon Flor was rescheduled from 01/18/22 to 01/25/22. She asked if she needs to hold her Eliquis prior to procedure.  Informed patient I have made Dr. Hosie Poisson office aware that she is on Eliquis and that any hold orders/instructions will need to be made by Dr. Harlon Flor.  Patient verbalized understanding and expressed appreciation for call.

## 2022-01-13 NOTE — Telephone Encounter (Signed)
Pt c/o medication issue:  1. Name of Medication: states she will tell the nurse the name  2. How are you currently taking this medication (dosage and times per day)?   3. Are you having a reaction (difficulty breathing--STAT)? no  4. What is your medication issue? Patient states she has a question about a medication, but refused to give the name. She states she can tell the nurse the name of the medication.

## 2022-01-13 NOTE — Telephone Encounter (Signed)
Pt is asking for a call from RN to discuss if she will need another sleep study, please call

## 2022-01-13 NOTE — Telephone Encounter (Signed)
Patient left a voicemail on my phone stating that she would like to make an appointment with Dr. Frances Furbish before scheduling a sleep study.

## 2022-01-13 NOTE — Telephone Encounter (Signed)
Please see office visit note from May 2023 for reference as well as patient instructions indicating that patient would call us to let us know her new insurance and then we would place an order for AutoPap therapy.  I do not see where she called and provided [place] with her new insurance information so we can place an order for AutoPap therapy.  I can place an order for AutoPap therapy, 5 to 12 cm, mask of choice, sized to fit.

## 2022-01-14 NOTE — Telephone Encounter (Signed)
I spoke with the patient. We discussed the message below from Dr Frances Furbish. Pt currently has BCBS and Medicare but she will have [employer] medicare in January 2024. She is considering waiting for the CPAP until she gets her new insurance but I did discuss with her that we can send the information over now to [employer] and they can check cost and advise if ok to get started now. We discussed the potential risks of sleep apnea being left untreated. Pt will watch for a call from [employer]. She is aware of insurance compliance requirements which includes using machine at least 4 hours at night and also being seen between 30 and 90 days after setup. Pt will call to schedule her initial f/u appt when she gets the setup date.

## 2022-01-14 NOTE — Telephone Encounter (Signed)
Autopap orders sent to [employer] via secure community message.

## 2022-01-15 NOTE — Telephone Encounter (Signed)
New, Maryella Shivers, Otilio Jefferson, RN; [place], [place]; [place], [place]; [place], [place]; [place], [facility],  Since its been almost 3 years and patient was never setup, patient will need a new study to be set up on pap.  Thank you,  Nida Boatman New  He also added in a second message that if patient needed unit ASAP she could be private pay and would need to sign a waiver. He anticipates it would be cheaper to go through insurance.

## 2022-01-19 ENCOUNTER — Encounter: Payer: Self-pay | Admitting: Internal Medicine

## 2022-01-19 ENCOUNTER — Ambulatory Visit (INDEPENDENT_AMBULATORY_CARE_PROVIDER_SITE_OTHER): Payer: Medicare Other | Admitting: Internal Medicine

## 2022-01-19 VITALS — BP 150/84 | HR 70 | Temp 98.9°F | Ht 63.8 in | Wt 209.8 lb

## 2022-01-19 DIAGNOSIS — R1031 Right lower quadrant pain: Secondary | ICD-10-CM | POA: Diagnosis not present

## 2022-01-19 DIAGNOSIS — R8290 Unspecified abnormal findings in urine: Secondary | ICD-10-CM | POA: Diagnosis not present

## 2022-01-19 DIAGNOSIS — I2510 Atherosclerotic heart disease of native coronary artery without angina pectoris: Secondary | ICD-10-CM

## 2022-01-19 DIAGNOSIS — M25552 Pain in left hip: Secondary | ICD-10-CM

## 2022-01-19 DIAGNOSIS — M25551 Pain in right hip: Secondary | ICD-10-CM | POA: Diagnosis not present

## 2022-01-19 DIAGNOSIS — Z6836 Body mass index (BMI) 36.0-36.9, adult: Secondary | ICD-10-CM

## 2022-01-19 DIAGNOSIS — Z636 Dependent relative needing care at home: Secondary | ICD-10-CM

## 2022-01-19 LAB — POCT URINALYSIS DIPSTICK
Bilirubin, UA: NEGATIVE
Glucose, UA: NEGATIVE
Ketones, UA: NEGATIVE
Nitrite, UA: NEGATIVE
Protein, UA: POSITIVE — AB
Spec Grav, UA: 1.02 (ref 1.010–1.025)
Urobilinogen, UA: 0.2 E.U./dL
pH, UA: 6 (ref 5.0–8.0)

## 2022-01-19 MED ORDER — CEPHALEXIN 500 MG PO CAPS: 500.0000 mg | ORAL_CAPSULE | Freq: Three times a day (TID) | ORAL | 0 refills | Status: AC

## 2022-01-19 MED ORDER — CEPHALEXIN 500 MG PO CAPS: 500.0000 mg | ORAL_CAPSULE | Freq: Two times a day (BID) | ORAL | 0 refills | Status: DC

## 2022-01-19 NOTE — Progress Notes (Signed)
Dawn Wood,acting as a Neurosurgeon for Dawn Aliment, MD.,have documented all relevant documentation on the behalf of Dawn Aliment, MD,as directed by  Dawn Aliment, MD while in the presence of Dawn Aliment, MD.    Subjective:     Patient ID: Dawn Wood , female    DOB: [birth year] , 76 y.o.   MRN: [id number]   Chief Complaint  Patient presents with   Dysuria    HPI  Patient presents today for dysuria. Patient also reports having some low back pain and ankle swelling. She reports having a dent in her left foot.   Dysuria  This is a new problem. The current episode started in the past 7 days. The pain is mild. Associated symptoms include flank pain and frequency. Pertinent negatives include no hesitancy or urgency. She has tried nothing for the symptoms.     Past Medical History:  Diagnosis Date   Coronary artery disease    Diabetes mellitus without complication (HCC)    GERD (gastroesophageal reflux disease)    Hypertension    MI (myocardial infarction) (HCC)    Peripheral vascular disease (HCC)      Family History  Problem Relation Age of Onset   Hypertension Mother    Cancer Father    Hypertension Father    Breast cancer Sister 20   Aortic stenosis Daughter    Heart disease Daughter        before age 19   Thyroid disease Daughter    Sleep apnea Neg Hx      Current Outpatient Medications:    amLODipine (NORVASC) 5 MG tablet, Take 1 tablet (5 mg total) by mouth daily., Disp: 90 tablet, Rfl: 3   apixaban (ELIQUIS) 5 MG TABS tablet, Take 1 tablet (5 mg total) by mouth 2 (two) times daily., Disp: 60 tablet, Rfl: 11   BD PEN NEEDLE NANO 2ND GEN 32G X 4 MM MISC, USE AS DIRECTED TWICE DAILY, Disp: 100 each, Rfl: 2   Cholecalciferol (VITAMIN D3) 2000 units TABS, Take 1 tablet by mouth daily., Disp: , Rfl:    Coenzyme Q10 (CO Q 10) 10 MG CAPS, Take by mouth daily., Disp: , Rfl:    colchicine 0.6 MG tablet, Take 1 tablet by mouth daily. If you  experience a flare, increase to 2 tablets by mouth daily. (Patient taking differently: Take 0.6 mg by mouth as needed. Take 1 tablet by mouth daily. If you experience a flare, increase to 2 tablets by mouth daily.), Disp: 60 tablet, Rfl: 2   Evolocumab (REPATHA SURECLICK) 140 MG/ML SOAJ, Inject 1 Pen into the skin every 14 (fourteen) days., Disp: 2 mL, Rfl: 11   famotidine (PEPCID) 20 MG tablet, TAKE 1 TABLET(20 MG) BY MOUTH TWICE DAILY, Disp: 180 tablet, Rfl: 2   fluticasone (FLONASE) 50 MCG/ACT nasal spray, SHAKE LIQUID AND USE 1 SPRAY IN EACH NOSTRIL DAILY AS NEEDED FOR ALLERGIES OR RHINITIS, Disp: 16 g, Rfl: 2   gabapentin (NEURONTIN) 100 MG capsule, One tab po qhs x 1 week, then 2 tabs po qhs, Disp: 180 capsule, Rfl: 0   lidocaine-prilocaine (EMLA) cream, , Disp: , Rfl:    metoprolol succinate (TOPROL-XL) 100 MG 24 hr tablet, Take 1 tablet (100 mg total) by mouth daily. Take with or immediately following a meal., Disp: , Rfl:    nitroGLYCERIN (NITROSTAT) 0.4 MG SL tablet, Place 1 tablet (0.4 mg total) under the tongue every 5 (five) minutes as needed for chest pain., Disp:  25 tablet, Rfl: 3   NOVOLOG MIX 70/30 FLEXPEN (70-30) 100 UNIT/ML FlexPen, INJECT 40 UNITS SUBCUTANEOUS EVERY MORNING AND 45 UNITS EVERY NIGHT AT BEDTIME, Disp: 75 mL, Rfl: 2   olmesartan (BENICAR) 40 MG tablet, TAKE 1 TABLET(40 MG) BY MOUTH DAILY, Disp: 90 tablet, Rfl: 2   ONETOUCH VERIO test strip, USE TO CHECK BLOOD SUGARS TWICE DAILY AS DIRECTED, Disp: 100 strip, Rfl: 3   Semaglutide,0.25 or 0.5MG /DOS, (OZEMPIC, 0.25 OR 0.5 MG/DOSE,) 2 MG/1.5ML SOPN, INJECT 0.5MG  INTO SKIN ONCE A WEEK, Disp: 4.5 mL, Rfl: 3   spironolactone-hydrochlorothiazide (ALDACTAZIDE) 25-25 MG tablet, Take 1 tablet by mouth daily., Disp: 90 tablet, Rfl: 3   cephALEXin (KEFLEX) 500 MG capsule, Take 1 capsule (500 mg total) by mouth 3 (three) times daily for 10 days., Disp: 30 capsule, Rfl: 0   Allergies  Allergen Reactions   Dust Mite Extract Other  (See Comments)    REACTION: SNEEZING   Other Other (See Comments)    ALLERGEN: DEODORIZERS SPRAY AND PERFUMES REACTION: SNEEZING   Statins Other (See Comments)    Muscle aches with rosuvastatin 5 mg daily and 3x/weekly, atorvastatin 20 mg daily and 3x/week, simvastatin 40 mg daily    Allopurinol     Diarrhea and the pt said it made her sugars low   Pollen Extract Other (See Comments)    Reaction unknown     Review of Systems  Constitutional: Negative.   Respiratory: Negative.    Cardiovascular: Negative.   Gastrointestinal: Negative.   Genitourinary:  Positive for dysuria, flank pain and frequency. Negative for hesitancy and urgency.  Musculoskeletal:  Positive for arthralgias.       She c/o r groin pain. There is some pain with ambulation. She denies having a recent fall.   Neurological: Negative.   Psychiatric/Behavioral: Negative.       Today's Vitals   01/19/22 1432 01/19/22 1459  BP: (!) 160/80 (!) 150/84  Pulse: 70   Temp: 98.9 F (37.2 C)   Weight: 209 lb 12.8 oz (95.2 kg)   Height: 5' 3.8" (1.621 m)   PainSc: 0-No pain    Body mass index is 36.24 kg/m.  Wt Readings from Last 3 Encounters:  01/19/22 209 lb 12.8 oz (95.2 kg)  01/11/22 211 lb 12.8 oz (96.1 kg)  12/30/21 207 lb 6.4 oz (94.1 kg)    BP Readings from Last 3 Encounters:  01/19/22 (!) 150/84  01/11/22 138/62  12/30/21 122/80     Objective:  Physical Exam Vitals and nursing note reviewed.  Constitutional:      Appearance: Normal appearance.  HENT:     Head: Normocephalic and atraumatic.     Nose:     Comments: Masked     Mouth/Throat:     Comments: Masked  Eyes:     Extraocular Movements: Extraocular movements intact.  Cardiovascular:     Rate and Rhythm: Normal rate and regular rhythm.     Heart sounds: Normal heart sounds.  Pulmonary:     Effort: Pulmonary effort is normal.     Breath sounds: Normal breath sounds.  Musculoskeletal:        General: Tenderness present.     Cervical  back: Normal range of motion.  Skin:    General: Skin is warm.  Neurological:     General: No focal deficit present.     Mental Status: She is alert.  Psychiatric:        Mood and Affect: Mood normal.  Behavior: Behavior normal.      Assessment And Plan:     1. Abnormal urine odor Comments: U/a is suggestive of UTI. I will also send urine culture. I will treat accordingly. - POCT Urinalysis Dipstick [id number]) - Urine Culture  2. Right groin pain - Ambulatory referral to Orthopedic Surgery  3. Bilateral hip pain Comments: I will refer her for bilateral hip x-rays. - Ambulatory referral to Orthopedic Surgery  4. Class 2 severe obesity due to excess calories with serious comorbidity and body mass index (BMI) of 36.0 to 36.9 in adult [facility]) Comments: She is encouraged to aim for at least 150 minutes of exercise per week, while striving for BMI<30 to decrease cardiac risk.  5. Caregiver stress Comments: I am unable to send to Care Coordination b/c she is not part of [facility]. I will refer her to Caregiver Connections.   Patient was given opportunity to ask questions. Patient verbalized understanding of the plan and was able to repeat key elements of the plan. All questions were answered to their satisfaction.   I, [name], MD, have reviewed all documentation for this visit. The documentation on 01/19/22 for the exam, diagnosis, procedures, and orders are all accurate and complete.   IF YOU HAVE BEEN REFERRED TO A SPECIALIST, IT MAY TAKE 1-2 WEEKS TO SCHEDULE/PROCESS THE REFERRAL. IF YOU HAVE NOT HEARD FROM US/SPECIALIST IN TWO WEEKS, PLEASE GIVE [place] A CALL AT [phone] X [phone].   THE PATIENT IS ENCOURAGED TO PRACTICE SOCIAL DISTANCING DUE TO THE COVID-19 PANDEMIC.

## 2022-01-22 LAB — URINE CULTURE

## 2022-01-25 ENCOUNTER — Telehealth: Payer: Self-pay | Admitting: Pharmacist

## 2022-01-25 HISTORY — PX: OTHER SURGICAL HISTORY: SHX169

## 2022-01-25 NOTE — Telephone Encounter (Signed)
PA submitted. For Repatha. Key: [id number]

## 2022-01-25 NOTE — Telephone Encounter (Signed)
PA approved through 01/25/23 

## 2022-02-01 ENCOUNTER — Encounter (INDEPENDENT_AMBULATORY_CARE_PROVIDER_SITE_OTHER): Payer: BC Managed Care – PPO | Admitting: Ophthalmology

## 2022-02-01 DIAGNOSIS — H35032 Hypertensive retinopathy, left eye: Secondary | ICD-10-CM

## 2022-02-01 DIAGNOSIS — H18221 Idiopathic corneal edema, right eye: Secondary | ICD-10-CM | POA: Diagnosis not present

## 2022-02-01 DIAGNOSIS — I10 Essential (primary) hypertension: Secondary | ICD-10-CM

## 2022-02-01 DIAGNOSIS — H59021 Cataract (lens) fragments in eye following cataract surgery, right eye: Secondary | ICD-10-CM | POA: Diagnosis not present

## 2022-02-02 ENCOUNTER — Telehealth: Payer: Self-pay

## 2022-02-02 NOTE — Chronic Care Management (AMB) (Signed)
Novo Nordisk patient assistance program notification:  120- day supply of Rybelsus 14 mg was filled on 01/05/2022 and  should arrive to the office in 10-14 business days. Patient has 0  refill remaining and enrollment will expire on 03/07/2022.  Billee Cashing, CMA Clinical Pharmacist Assistant [phone]

## 2022-02-24 ENCOUNTER — Other Ambulatory Visit: Payer: Self-pay | Admitting: Internal Medicine

## 2022-03-08 HISTORY — PX: CATARACT EXTRACTION: SUR2

## 2022-03-15 ENCOUNTER — Other Ambulatory Visit: Payer: Self-pay | Admitting: Internal Medicine

## 2022-03-17 ENCOUNTER — Telehealth: Payer: Self-pay | Admitting: Interventional Cardiology

## 2022-03-17 NOTE — Telephone Encounter (Signed)
Pt states she is having symptoms and did not want to disclose over the phone what they were. Please advise.

## 2022-03-17 NOTE — Telephone Encounter (Signed)
Returned call to patient.  Patient states for the past 3 days she has had aching/soreness in her left shoulder blade/arm down to elbow and numbness/tingling to left hand that comes and goes.  Patient states pain is worse at night. Denies chest pain or SOB. She states Tylenol helps take the edge off at night.  Advised on ED precautions. Patient verbalized understanding.  Offered next available appt with Nicholes Rough, PA-C on 03/19/2022 for evaluation, patient accepted.   Patient expressed appreciation for call.

## 2022-03-18 NOTE — Progress Notes (Signed)
Office Visit    Patient Name: Dawn Wood Date of Encounter: 03/19/2022  PCP:  Glendale Chard, [place]  Cardiologist:  Sinclair Grooms, MD  Advanced Practice Provider:  No care team member to display Electrophysiologist:  None   HPI    Dawn Wood is a 77 y.o. female with a past medical history of CAD prior LAD DES stent (2015), hypertension, DM 2, PAD, osteoarthritis, gouty arthritis, and hyperlipidemia presents today for follow-up appointment.  She was last seen 01/14/2022 and was having symptoms of irregular feelings in her chest related to atrial fibrillation based on a recent monitor.  No angina or sublingual nitro use.  Today, she states that she had recent eye surgery and she is coming off of some steroids.  She does have some lower extremity edema.  She had some chest soreness for the last 3 to 4 days which can be reproduced by pushing on her chest.  She has not done any exercise that would lead to chest wall soreness.  She does state that when she leans back it significantly worse.  She did have a stent placed back in 2015 and that was her last echocardiogram.  This does not feel like the pain that she was having when she had her stent.  Nothing makes the pain subside.  She states its about a 7 out of 10.  We gave her a sublingual nitro here in the clinic.  She states that she had taken 1 at home in the past and had not really helped.  She had been on colchicine before for gout.  I discussed her case with Dr. Acie Fredrickson who also spoke with the patient today.  EKG unremarkable.  Reports no shortness of breath nor dyspnea on exertion. Reports no palpitations.    Past Medical History    Past Medical History:  Diagnosis Date   Coronary artery disease    Diabetes mellitus without complication (HCC)    GERD (gastroesophageal reflux disease)    Hypertension    MI (myocardial infarction) ([place])    Peripheral vascular disease ([place])     Past Surgical History:  Procedure Laterality Date   ABDOMINAL HYSTERECTOMY  1985   APPENDECTOMY     CATARACT EXTRACTION Left 02/20/2018   Dr. Venetia Maxon   CORONARY STENT PLACEMENT  2015   ectopic pregnancy--unilateral salpingectomy  1980   KNEE ARTHROSCOPY Left 03/2016   unilateral oophorectomy      Allergies  Allergies  Allergen Reactions   Dust Mite Extract Other (See Comments)    REACTION: SNEEZING   Other Other (See Comments)    ALLERGEN: DEODORIZERS SPRAY AND PERFUMES REACTION: SNEEZING   Statins Other (See Comments)    Muscle aches with rosuvastatin 5 mg daily and 3x/weekly, atorvastatin 20 mg daily and 3x/week, simvastatin 40 mg daily    Allopurinol     Diarrhea and the pt said it made her sugars low   Pollen Extract Other (See Comments)    Reaction unknown     EKGs/Labs/Other Studies Reviewed:   The following studies were reviewed today:  Lexiscan Myoview 06/05/2021    The study is normal. The study is low risk.   No ST deviation was noted.   Left ventricular function is normal. Nuclear stress EF: 69 %. The left ventricular ejection fraction is hyperdynamic (>65%). End diastolic cavity size is normal.   Prior study available for comparison from 10/22/1998.      Cardiac  monitor 11/10/21   Basic rhythm is NSR with average HR 77 bpm   104 SVT runs with longest 1.5 minutes   Atrial fibrillation burden < 1% with longest lasting 13.5 minutes at average HR 148 bpm   One symptomatic episode correlated with AF     Patch Wear Time:  14 days and 0 hours (2023-09-29T10:13:25-0400 to 2023-10-13T10:13:29-0400)   Patient had a min HR of 49 bpm, max HR of 197 bpm, and avg HR of 77 bpm. Predominant underlying rhythm was Sinus Rhythm. 104 Supraventricular Tachycardia runs occurred, the run with the fastest interval lasting 11.7 secs with a max rate of 197 bpm, the  longest lasting 1 min 22 secs with an avg rate of 145 bpm. Atrial Fibrillation/Flutter occurred (<1% burden),  ranging from 86-186 bpm (avg of 138 bpm), the longest lasting 13 mins 31 secs with an avg rate of 148 bpm. Atrial Fibrillation/Flutter was  detected within +/- 45 seconds of symptomatic patient event(s). Isolated SVEs were rare (<1.0%), SVE Couplets were rare (<1.0%), and SVE Triplets were rare (<1.0%). Isolated VEs were rare (<1.0%, 2298), VE Couplets were rare (<1.0%, 42), and VE Triplets  were rare (<1.0%, 2). Ventricular Bigeminy and Trigeminy were present.     EKG:  EKG is  ordered today.  The ekg ordered today demonstrates sinus bradycardia and LVH  Recent Labs: 09/22/2021: ALT 12; Hemoglobin 14.0; Platelets 288 12/30/2021: BUN 27; Creatinine, Ser 1.17; Potassium 4.0; Sodium 141  Recent Lipid Panel    Component Value Date/Time   CHOL 192 09/22/2021 1654   CHOL 167 05/28/2013 0413   TRIG 171 (H) 09/22/2021 1654   TRIG 180 05/28/2013 0413   HDL 49 09/22/2021 1654   HDL 37 (L) 05/28/2013 0413   CHOLHDL 3.9 09/22/2021 1654   CHOLHDL 3 12/02/2014 0856   VLDL 22.0 12/02/2014 0856   VLDL 36 05/28/2013 0413   LDLCALC 113 (H) 09/22/2021 1654   LDLCALC 94 05/28/2013 0413   LDLDIRECT 83 06/11/2019 1425    Home Medications   Current Meds  Medication Sig   amLODipine (NORVASC) 5 MG tablet Take 1 tablet (5 mg total) by mouth daily.   apixaban (ELIQUIS) 5 MG TABS tablet Take 1 tablet (5 mg total) by mouth 2 (two) times daily.   BD PEN NEEDLE NANO 2ND GEN 32G X 4 MM MISC USE AS DIRECTED TWICE DAILY   carvedilol (COREG) 25 MG tablet Take 1 tablet (25 mg total) by mouth 2 (two) times daily.   Cholecalciferol (VITAMIN D3) 2000 units TABS Take 1 tablet by mouth daily.   Coenzyme Q10 (CO Q 10) 10 MG CAPS Take by mouth daily.   colchicine 0.6 MG tablet Take 1 tablet (0.6 mg total) by mouth 2 (two) times daily.   Evolocumab (REPATHA SURECLICK) 528 MG/ML SOAJ Inject 1 Pen into the skin every 14 (fourteen) days.   famotidine (PEPCID) 20 MG tablet TAKE 1 TABLET(20 MG) BY MOUTH TWICE DAILY    fluticasone (FLONASE) 50 MCG/ACT nasal spray SHAKE LIQUID AND USE 1 SPRAY IN EACH NOSTRIL DAILY AS NEEDED FOR ALLERGIES OR RHINITIS   gabapentin (NEURONTIN) 100 MG capsule One tab po qhs x 1 week, then 2 tabs po qhs   lidocaine-prilocaine (EMLA) cream    nitroGLYCERIN (NITROSTAT) 0.4 MG SL tablet Place 1 tablet (0.4 mg total) under the tongue every 5 (five) minutes as needed for chest pain.   NOVOLOG MIX 70/30 FLEXPEN (70-30) 100 UNIT/ML FlexPen INJECT 40 UNITS SUBCUTANEOUS EVERY MORNING AND 45 UNITS  SUBCUTANEOUS AT BEDTIME   olmesartan (BENICAR) 40 MG tablet TAKE 1 TABLET(40 MG) BY MOUTH DAILY   ONETOUCH VERIO test strip USE TO CHECK BLOOD SUGARS TWICE DAILY AS DIRECTED   Semaglutide,0.25 or 0.5MG /DOS, (OZEMPIC, 0.25 OR 0.5 MG/DOSE,) 2 MG/1.5ML SOPN INJECT 0.5MG  INTO SKIN ONCE A WEEK   spironolactone-hydrochlorothiazide (ALDACTAZIDE) 25-25 MG tablet Take 1 tablet by mouth daily.   [DISCONTINUED] colchicine 0.6 MG tablet Take 1 tablet by mouth daily. If you experience a flare, increase to 2 tablets by mouth daily. (Patient taking differently: Take 0.6 mg by mouth as needed. Take 1 tablet by mouth daily. If you experience a flare, increase to 2 tablets by mouth daily.)   [DISCONTINUED] metoprolol succinate (TOPROL-XL) 100 MG 24 hr tablet Take 1 tablet (100 mg total) by mouth daily. Take with or immediately following a meal.   Current Facility-Administered Medications for the 03/19/22 encounter (Office Visit) with Sharlene Dory, PA-C  Medication   nitroGLYCERIN (NITROSTAT) SL tablet 0.4 mg     Review of Systems      All other systems reviewed and are otherwise negative except as noted above.  Physical Exam    VS:  BP (!) 160/80   Pulse (!) 57   Ht 5' 5.5" (1.664 m)   Wt 209 lb (94.8 kg)   SpO2 98%   BMI 34.25 kg/m  , BMI Body mass index is 34.25 kg/m.  Wt Readings from Last 3 Encounters:  03/19/22 209 lb (94.8 kg)  01/19/22 209 lb 12.8 oz (95.2 kg)  01/11/22 211 lb 12.8 oz (96.1 kg)      GEN: Well nourished, well developed, in no acute distress. HEENT: normal. Neck: Supple, no JVD, carotid bruits, or masses. Cardiac: RRR, no murmurs, rubs, or gallops. No clubbing, cyanosis, edema.  Radials/PT 2+ and equal bilaterally.  Respiratory:  Respirations regular and unlabored, clear to auscultation bilaterally. GI: Soft, nontender, nondistended. MS: No deformity or atrophy. Skin: Warm and dry, no rash. Neuro:  Strength and sensation are intact. Psych: Normal affect.  Assessment & Plan    Paroxysmal atrial fibrillation -brief episode on monitor -better control on higher dose of metoprolol (switched to coreg today due to BP) -She is on Eliquis 5 mg twice daily  Chest pain/CAD -pain described as "soreness in her left chest" which started 4 days ago -nitro does not help -Pain seems to be nonexertional but gets worse with laying flat -Will start her on colchicine twice daily -Echocardiogram ordered -EKG unremarkable -Stat troponin  Essential hypertension -Elevated today in the clinic which could be related to the chest pain she is experiencing -We changed her metoprolol to Coreg -Will reevaluate after echocardiogram but if there is a component of diastolic dysfunction would consider adding Entresto and stopping her Benicar  Hyperlipidemia -Continue Repatha -Last LDL 113 (09/2021) -Will be due for an updated lipid panel 09/2022 -Goal LDL is less than 70 due to CAD  Uncontrolled diabetes mellitus type 2 -A1c 6.5 -Continue current medications -Per PCP    Disposition: Follow up 3-4 weeks with Lesleigh Noe, MD or APP.  Signed, Sharlene Dory, PA-C 03/19/2022, 1:11 PM [facility]

## 2022-03-19 ENCOUNTER — Encounter: Payer: Self-pay | Admitting: *Deleted

## 2022-03-19 ENCOUNTER — Ambulatory Visit (HOSPITAL_COMMUNITY): Payer: Medicare PPO | Attending: Physician Assistant

## 2022-03-19 ENCOUNTER — Ambulatory Visit: Payer: Medicare PPO | Attending: Physician Assistant | Admitting: Physician Assistant

## 2022-03-19 VITALS — BP 160/80 | HR 57 | Ht 65.5 in | Wt 209.0 lb

## 2022-03-19 DIAGNOSIS — I480 Paroxysmal atrial fibrillation: Secondary | ICD-10-CM

## 2022-03-19 DIAGNOSIS — I739 Peripheral vascular disease, unspecified: Secondary | ICD-10-CM | POA: Diagnosis not present

## 2022-03-19 DIAGNOSIS — E785 Hyperlipidemia, unspecified: Secondary | ICD-10-CM

## 2022-03-19 DIAGNOSIS — I2510 Atherosclerotic heart disease of native coronary artery without angina pectoris: Secondary | ICD-10-CM

## 2022-03-19 DIAGNOSIS — R079 Chest pain, unspecified: Secondary | ICD-10-CM

## 2022-03-19 DIAGNOSIS — E1165 Type 2 diabetes mellitus with hyperglycemia: Secondary | ICD-10-CM

## 2022-03-19 DIAGNOSIS — I10 Essential (primary) hypertension: Secondary | ICD-10-CM

## 2022-03-19 DIAGNOSIS — G4733 Obstructive sleep apnea (adult) (pediatric): Secondary | ICD-10-CM | POA: Diagnosis not present

## 2022-03-19 LAB — TROPONIN T: Troponin T (Highly Sensitive): 9 ng/L (ref 0–14)

## 2022-03-19 MED ORDER — CARVEDILOL 25 MG PO TABS: 25.0000 mg | ORAL_TABLET | Freq: Two times a day (BID) | ORAL | 3 refills | Status: DC

## 2022-03-19 MED ORDER — COLCHICINE 0.6 MG PO TABS: 0.6000 mg | ORAL_TABLET | Freq: Two times a day (BID) | ORAL | 3 refills | Status: DC

## 2022-03-19 MED ORDER — NITROGLYCERIN 0.4 MG SL SUBL: 0.4000 mg | SUBLINGUAL_TABLET | SUBLINGUAL | Status: DC | PRN

## 2022-03-19 NOTE — Patient Instructions (Addendum)
Medication Instructions:  1.Stop metoprolol succinate (Toprol XL) 2.Start carvedilol (Coreg) 25 mg twice a day 3.Start colchicine 0.6 mg twice a day *If you need a refill on your cardiac medications before your next appointment, please call your pharmacy*   Lab Work: Troponin today If you have labs (blood work) drawn today and your tests are completely normal, you will receive your results only by: [place] (if you have MyChart) OR A paper copy in the mail If you have any lab test that is abnormal or we need to change your treatment, we will call you to review the results.   Testing/Procedures: Your physician has requested that you have an echocardiogram, next today at 3:50 PM. Echocardiography is a painless test that uses sound waves to create images of your heart. It provides your doctor with information about the size and shape of your heart and how well your heart's chambers and valves are working. This procedure takes approximately one hour. There are no restrictions for this procedure. Please do NOT wear cologne, perfume, aftershave, or lotions (deodorant is allowed). Please arrive 15 minutes prior to your appointment time.    Follow-Up: At [facility], you and your health needs are our priority.  As part of our continuing mission to provide you with exceptional heart care, we have created designated Provider Care Teams.  These Care Teams include your primary Cardiologist (physician) and Advanced Practice Providers (APPs -  Physician Assistants and Nurse Practitioners) who all work together to provide you with the care you need, when you need it.  We recommend signing up for the patient portal called "MyChart".  Sign up information is provided on this After Visit Summary.  MyChart is used to connect with patients for Virtual Visits (Telemedicine).  Patients are able to view lab/test results, encounter notes, upcoming appointments, etc.  Non-urgent messages can be sent  to your provider as well.   To learn more about what you can do with MyChart, go to [email or web address].    Your next appointment:   1 month(s)  Provider:   Dr Johney Frame or Nicholes Rough, PA-C

## 2022-03-20 LAB — ECHOCARDIOGRAM COMPLETE
Area-P 1/2: 2.76 cm2
Height: 65.5 in
S' Lateral: 2.6 cm
Weight: 3344 oz

## 2022-03-22 ENCOUNTER — Other Ambulatory Visit: Payer: Self-pay | Admitting: Interventional Cardiology

## 2022-03-26 ENCOUNTER — Other Ambulatory Visit: Payer: Self-pay

## 2022-03-26 MED ORDER — SPIRONOLACTONE-HCTZ 25-25 MG PO TABS: 1.0000 | ORAL_TABLET | Freq: Every day | ORAL | 3 refills | Status: DC

## 2022-03-30 ENCOUNTER — Other Ambulatory Visit: Payer: Self-pay

## 2022-03-30 DIAGNOSIS — Z794 Long term (current) use of insulin: Secondary | ICD-10-CM

## 2022-03-30 MED ORDER — SPIRONOLACTONE-HCTZ 25-25 MG PO TABS: 1.0000 | ORAL_TABLET | Freq: Every day | ORAL | 3 refills | Status: DC

## 2022-03-31 ENCOUNTER — Telehealth: Payer: Self-pay | Admitting: Neurology

## 2022-03-31 NOTE — Telephone Encounter (Signed)
I had requested you place the order for a sleep study back in November, I assume, this was not placed at the time, may have been miscommunication. I will place an order for the sleep study. HST order placed.

## 2022-03-31 NOTE — Addendum Note (Signed)
Addended by: Star Age on: 03/31/2022 10:07 AM   Modules accepted: Orders

## 2022-03-31 NOTE — Telephone Encounter (Signed)
Spoke with patient, apologized, and told her our office has now placed the HST order. Pt said she had been busy with cataract surgery and complications from that. She has been back and forth to [facility] for her eye and has required additional surgery so she was too busy to call, but now she is ready. I told her our office would get auth from her new insurance and call her to schedule. She will f/u if she doesn't hear in roughly 2 weeks. She was appreciative of the call.

## 2022-03-31 NOTE — Telephone Encounter (Signed)
Will document in other phone note.

## 2022-03-31 NOTE — Telephone Encounter (Signed)
Pt is calling. Stating she wants to follow-up on her Sleep Study. Pt is requesting a call from nurse.

## 2022-04-09 ENCOUNTER — Telehealth: Payer: Self-pay | Admitting: Interventional Cardiology

## 2022-04-09 MED ORDER — APIXABAN 5 MG PO TABS: 5.0000 mg | ORAL_TABLET | Freq: Two times a day (BID) | ORAL | 1 refills | Status: DC

## 2022-04-09 MED ORDER — REPATHA SURECLICK 140 MG/ML ~~LOC~~ SOAJ: 1.0000 | SUBCUTANEOUS | 3 refills | Status: DC

## 2022-04-09 NOTE — Telephone Encounter (Signed)
Pt c/o medication issue:  1. Name of Medication:   Evolocumab (REPATHA SURECLICK) 284 MG/ML SOAJ    2. How are you currently taking this medication (dosage and times per day)?  Inject 1 Pen into the skin every 14 (fourteen) days.  3. Are you having a reaction (difficulty breathing--STAT)? No  4. What is your medication issue? Pt would like a callback regarding Pt. Assistance that she received for medication. She states that she was paying $5 and now cost has went up to $40. Please advise

## 2022-04-09 NOTE — Telephone Encounter (Signed)
State health plan insurance changed from commercial to Medicare plan for this year so her copay cards no longer work for branded meds. This will affect her Eliquis, Repatha, and Ozempic.  Sent in refills as 3 mo supply instead since her plan gives her a month free that way. She states she was advised to apply to [place] program through [employer]. Advised her that is a great idea as it would help with all of her medication copays if she's approved.

## 2022-04-21 ENCOUNTER — Other Ambulatory Visit: Payer: Self-pay | Admitting: Internal Medicine

## 2022-04-22 ENCOUNTER — Telehealth: Payer: Self-pay | Admitting: Neurology

## 2022-04-22 NOTE — Telephone Encounter (Signed)
HST- [employer] no auth req spoke to Yves Dill ref # [id number]   Patient is scheduled at [facility] for 05/25/22 at 1 pm.  Mailed packet to the patient.

## 2022-04-27 NOTE — Progress Notes (Unsigned)
Cardiology Office Note:    Date:  04/29/2022   ID:  Lashawnta, Groover 03-Jul-1945, MRN [id number]  PCP:  Glendale Chard, [place] [facility] Cardiologist:  Sinclair Grooms, MD (Inactive)   Referring MD: Glendale Chard, MD    History of Present Illness:    Dawn Wood is a 77 y.o. female with a hx of CAD prior LAD DES stent (2015), hypertension, DM 2, PAD, osteoarthritis, gouty arthritis, and hyperlipidemia presents today for follow-up.  Myoview 05/2021 normal. Cardiac monitor 11/2021 with <1% burden of Afib.  Was last seen by Nicholes Rough on 03/2022 where she was having atypical chest pain. TTE 03/2022 with LVEF 65-70%, normal RV, no significant valve disease.  Today, the patient overall feels well from a CV perspective. Has been struggling with her right eye and has had multiple procedures and is now planned for a lens implant. Continues to have intermittent chest pain that is atypical in nature and not exertional. Took nitro and pain improved. She is also awaiting her sleep study to hopefully start CPAP. No SOB at rest, orthopnea, or significant palpitations. States that she started to swell since starting carvedilol and wishes to change back to metoprolol. Would be willing to switch off the amlodipine if swelling does not improve off the coreg.  Past Medical History:  Diagnosis Date   Coronary artery disease    Diabetes mellitus without complication (HCC)    GERD (gastroesophageal reflux disease)    Hypertension    MI (myocardial infarction) ([place])    Peripheral vascular disease ([place])     Past Surgical History:  Procedure Laterality Date   ABDOMINAL HYSTERECTOMY  1985   APPENDECTOMY     CATARACT EXTRACTION Left 02/20/2018   Dr. Venetia Maxon   CORONARY STENT PLACEMENT  2015   ectopic pregnancy--unilateral salpingectomy  1980   KNEE ARTHROSCOPY Left 03/2016   unilateral oophorectomy      Current Medications: Current Meds  Medication Sig    amLODipine (NORVASC) 5 MG tablet Take 1 tablet (5 mg total) by mouth daily.   apixaban (ELIQUIS) 5 MG TABS tablet Take 1 tablet (5 mg total) by mouth 2 (two) times daily.   BD PEN NEEDLE NANO 2ND GEN 32G X 4 MM MISC USE AS DIRECTED TWICE DAILY   chlorthalidone (HYGROTON) 25 MG tablet Take 1 tablet (25 mg total) by mouth daily.   Cholecalciferol (VITAMIN D3) 2000 units TABS Take 1 tablet by mouth daily.   clopidogrel (PLAVIX) 75 MG tablet Take 1 tablet (75 mg total) by mouth daily.   Coenzyme Q10 (CO Q 10) 10 MG CAPS Take by mouth daily.   colchicine 0.6 MG tablet Take 1 tablet (0.6 mg total) by mouth 2 (two) times daily.   Evolocumab (REPATHA SURECLICK) XX123456 MG/ML SOAJ Inject 140 mg into the skin every 14 (fourteen) days.   famotidine (PEPCID) 20 MG tablet TAKE 1 TABLET(20 MG) BY MOUTH TWICE DAILY   fluticasone (FLONASE) 50 MCG/ACT nasal spray SHAKE LIQUID AND USE 1 SPRAY IN EACH NOSTRIL DAILY AS NEEDED FOR ALLERGIES OR RHINITIS   gabapentin (NEURONTIN) 100 MG capsule One tab po qhs x 1 week, then 2 tabs po qhs   lidocaine-prilocaine (EMLA) cream    metoprolol succinate (TOPROL-XL) 100 MG 24 hr tablet Take 1 tablet (100 mg total) by mouth daily. Take with or immediately following a meal.   nitroGLYCERIN (NITROSTAT) 0.4 MG SL tablet Place 1 tablet (0.4 mg total) under the tongue every  5 (five) minutes as needed for chest pain.   NOVOLOG MIX 70/30 FLEXPEN (70-30) 100 UNIT/ML FlexPen INJECT 40 UNITS SUBCUTANEOUS EVERY MORNING AND 45 UNITS SUBCUTANEOUS AT BEDTIME   olmesartan (BENICAR) 40 MG tablet TAKE 1 TABLET(40 MG) BY MOUTH DAILY   ONETOUCH VERIO test strip USE TO CHECK BLOOD SUGARS TWICE DAILY AS DIRECTED   Semaglutide,0.25 or 0.5MG/DOS, (OZEMPIC, 0.25 OR 0.5 MG/DOSE,) 2 MG/1.5ML SOPN INJECT 0.5MG INTO SKIN ONCE A WEEK   spironolactone (ALDACTONE) 25 MG tablet Take 1 tablet (25 mg total) by mouth daily.   [DISCONTINUED] carvedilol (COREG) 25 MG tablet Take 1 tablet (25 mg total) by mouth 2  (two) times daily.   [DISCONTINUED] spironolactone-hydrochlorothiazide (ALDACTAZIDE) 25-25 MG tablet Take 1 tablet by mouth daily.   Current Facility-Administered Medications for the 04/29/22 encounter (Office Visit) with Freada Bergeron, MD  Medication   nitroGLYCERIN (NITROSTAT) SL tablet 0.4 mg     Allergies:   Dust mite extract, Other, Statins, Allopurinol, and Pollen extract   Social History   Socioeconomic History   Marital status: Single    Spouse name: Not on file   Number of children: Not on file   Years of education: Not on file   Highest education level: Not on file  Occupational History   Not on file  Tobacco Use   Smoking status: Former    Packs/day: 0.25    Years: 45.00    Total pack years: 11.25    Types: Cigarettes    Quit date: 01/10/2013    Years since quitting: 9.3   Smokeless tobacco: Former  Scientific laboratory technician Use: Never used  Substance and Sexual Activity   Alcohol use: Not Currently    Alcohol/week: 0.0 standard drinks of alcohol   Drug use: No   Sexual activity: Not on file  Other Topics Concern   Not on file  Social History Narrative   Not on file   Social Determinants of Health   Financial Resource Strain: High Risk (08/11/2021)   Overall Financial Resource Strain (CARDIA)    Difficulty of Paying Living Expenses: Very hard  Food Insecurity: No Food Insecurity (10/29/2021)   Hunger Vital Sign    Worried About Running Out of Food in the Last Year: Never true    Ran Out of Food in the Last Year: Never true  Transportation Needs: No Transportation Needs (10/29/2021)   PRAPARE - Hydrologist (Medical): No    Lack of Transportation (Non-Medical): No  Physical Activity: Inactive (10/20/2021)   Exercise Vital Sign    Days of Exercise per Week: 0 days    Minutes of Exercise per Session: 0 min  Stress: No Stress Concern Present (10/20/2021)   [place]    Feeling of Stress : Only a little  Social Connections: Socially Isolated (10/20/2021)   Social Connection and Isolation Panel [NHANES]    Frequency of Communication with Friends and Family: More than three times a week    Frequency of Social Gatherings with Friends and Family: Once a week    Attends Religious Services: Never    Marine scientist or Organizations: No    Attends Music therapist: Never    Marital Status: Never married     Family History: The patient's family history includes Aortic stenosis in her daughter; Breast cancer (age of onset: 59) in her sister; Cancer in her father; Heart disease in her  daughter; Hypertension in her father and mother; Thyroid disease in her daughter. There is no history of Sleep apnea.  ROS:   Please see the history of present illness.     All other systems reviewed and are negative.  EKGs/Labs/Other Studies Reviewed:    The following studies were reviewed today: TTE March 30, 2022: IMPRESSIONS     1. Left ventricular ejection fraction, by estimation, is 65 to 70%. The  left ventricle has normal function. The left ventricle has no regional  wall motion abnormalities. There is moderate concentric left ventricular  hypertrophy. Left ventricular  diastolic parameters are indeterminate.   2. Right ventricular systolic function is normal. The right ventricular  size is normal.   3. Left atrial size was mild to moderately dilated.   4. Right atrial size was mildly dilated.   5. The mitral valve was not well visualized. No evidence of mitral valve  regurgitation. No evidence of mitral stenosis.   6. The aortic valve was not well visualized. There is mild calcification  of the aortic valve. Aortic valve regurgitation is not visualized. No  aortic stenosis is present.   7. Aortic dilatation noted. There is borderline dilatation of the  ascending aorta, measuring 38 mm.   8. The inferior vena cava is normal in size  with greater than 50%  respiratory variability, suggesting right atrial pressure of 3 mmHg.   Comparison(s): Prior images unable to be directly viewed, comparison made  by report only.  [place] 06/05/2021     The study is normal. The study is low risk.   No ST deviation was noted.   Left ventricular function is normal. Nuclear stress EF: 69 %. The left ventricular ejection fraction is hyperdynamic (>65%). End diastolic cavity size is normal.   Prior study available for comparison from 10/22/1998.         Cardiac monitor 11/10/21   Basic rhythm is NSR with average HR 77 bpm   104 SVT runs with longest 1.5 minutes   Atrial fibrillation burden < 1% with longest lasting 13.5 minutes at average HR 148 bpm   One symptomatic episode correlated with AF     Patch Wear Time:  14 days and 0 hours (2023-09-29T10:13:25-0400 to 2023-10-13T10:13:29-0400)   Patient had a min HR of 49 bpm, max HR of 197 bpm, and avg HR of 77 bpm. Predominant underlying rhythm was Sinus Rhythm. 104 Supraventricular Tachycardia runs occurred, the run with the fastest interval lasting 11.7 secs with a max rate of 197 bpm, the  longest lasting 1 min 22 secs with an avg rate of 145 bpm. Atrial Fibrillation/Flutter occurred (<1% burden), ranging from 86-186 bpm (avg of 138 bpm), the longest lasting 13 mins 31 secs with an avg rate of 148 bpm. Atrial Fibrillation/Flutter was  detected within +/- 45 seconds of symptomatic patient event(s). Isolated SVEs were rare (<1.0%), SVE Couplets were rare (<1.0%), and SVE Triplets were rare (<1.0%). Isolated VEs were rare (<1.0%, 2298), VE Couplets were rare (<1.0%, 42), and VE Triplets  were rare (<1.0%, 2). Ventricular Bigeminy and Trigeminy were present.   EKG:  EKG is not ordered today.   Recent Labs: 09/22/2021: ALT 12; Hemoglobin 14.0; Platelets 288 12/30/2021: BUN 27; Creatinine, Ser 1.17; Potassium 4.0; Sodium 141  Recent Lipid Panel    Component Value Date/Time   CHOL  192 09/22/2021 1654   CHOL 167 05/28/2013 0413   TRIG 171 (H) 09/22/2021 1654   TRIG 180 05/28/2013 0413   HDL 49 09/22/2021  1654   HDL 37 (L) 05/28/2013 0413   CHOLHDL 3.9 09/22/2021 1654   CHOLHDL 3 12/02/2014 0856   VLDL 22.0 12/02/2014 0856   VLDL 36 05/28/2013 0413   LDLCALC 113 (H) 09/22/2021 1654   LDLCALC 94 05/28/2013 0413   LDLDIRECT 83 06/11/2019 1425     Risk Assessment/Calculations:    CHA2DS2-VASc Score = 6   This indicates a 9.7% annual risk of stroke. The patient's score is based upon: CHF History: 0 HTN History: 1 Diabetes History: 1 Stroke History: 0 Vascular Disease History: 1 Age Score: 2 Gender Score: 1         Physical Exam:    VS:  BP (!) 144/80   Pulse 69   Ht 5' 5.5" (1.664 m)   Wt 214 lb 12.8 oz (97.4 kg)   SpO2 94%   BMI 35.20 kg/m     Wt Readings from Last 3 Encounters:  04/29/22 214 lb 12.8 oz (97.4 kg)  03/19/22 209 lb (94.8 kg)  01/19/22 209 lb 12.8 oz (95.2 kg)     GEN:  Well nourished, well developed in no acute distress HEENT: Normal NECK: No JVD; No carotid bruits CARDIAC: RRR, 1/6 systolic murmur. No rubs, gallops RESPIRATORY:  Clear to auscultation without rales, wheezing or rhonchi  ABDOMEN: Soft, non-tender, non-distended MUSCULOSKELETAL: 1+ edema to the low shin, warm SKIN: Warm and dry NEUROLOGIC:  Alert and oriented x 3 PSYCHIATRIC:  Normal affect   ASSESSMENT:    1. Paroxysmal atrial fibrillation (HCC)   2. Medication management   3. Essential hypertension   4. Bilateral lower extremity edema   5. CAD in native artery   6. Obstructive sleep apnea   7. Diabetes mellitus with coincident hypertension ([place])   8. Secondary hypercoagulable state ([place])    PLAN:    In order of problems listed above:  #Paroxysmal Afib: CHADs-vasc 6. Currently doing well without significant palpitations.  -Continue apixban 25m BID -Change coreg to metoprolol 1047mXL daily as patient reports that her swelling developed after  starting coreg and wishes to change back -She is scheduled for a sleep study this month  #CAD with PCI to LAD in 2015: Doing well without significant anginal symptoms. Myoview 2023 with no evidence of ischemia. -Off plavix due to need [facility] repatha 14052m2 weeks  #HTN: Elevated to 140-150s.  -Continue amlodipine 5mg73mily -Change coreg to metoprolol 100mg79mdaily as patient reports that her swelling developed after starting coreg and wishes to change back -Stop spiro-HCTZ  -Start spironolactone 25mg 64my -Start chlorthalidone 25mg d33m -Suspect BP will improve with treatment of OSA as well; she is scheduled for sleep study this month -BMET next week -Will return to clinic in 3months35monthcheck BP  #LE edema: Patient states that her symptoms developed after starting the coreg. Discussed that it may also be related to the amlodipine but she wishes to change back to metop for now and if symptoms persist, will change off the amlodipine. Plan to start chlorthalidone as above for BP control. Will check BNP with labs next week. -Change coreg to metoprolol 100mg XL 19my as patient reports that her swelling developed after starting coreg and wishes to change back -If symptoms persist despite changing off the coreg, will change off the amlodipine -Will check BNP with labs next week  #HLD: -Continue repatha 140mg q 2w78m -Repeat lipids at next visit in 3 months  #DMII: On insulin. -Management per PCP  #OSA: Planned for a  sleep study this month.            Medication Adjustments/Labs and Tests Ordered: Current medicines are reviewed at length with the patient today.  Concerns regarding medicines are outlined above.  Orders Placed This Encounter  Procedures   Basic metabolic panel   Pro b natriuretic peptide   Meds ordered this encounter  Medications   metoprolol succinate (TOPROL-XL) 100 MG 24 hr tablet    Sig: Take 1 tablet (100 mg total) by mouth daily. Take  with or immediately following a meal.    Dispense:  90 tablet    Refill:  1   spironolactone (ALDACTONE) 25 MG tablet    Sig: Take 1 tablet (25 mg total) by mouth daily.    Dispense:  90 tablet    Refill:  1   chlorthalidone (HYGROTON) 25 MG tablet    Sig: Take 1 tablet (25 mg total) by mouth daily.    Dispense:  90 tablet    Refill:  1    Patient Instructions  Medication Instructions:   STOP TAKING CARVEDILOL (COREG) NOW   STOP TAKING SPIRONOLACTONE-HYDROCHLOROTHIAZIDE NOW  START TAKING METOPROLOL SUCCINATE (TOPROL XL) 100 MG BY MOUTH DAILY  START TAKING SPIRONOLACTONE 25 MG BY MOUTH DAILY  START TAKING CHLORTHALIDONE 25 MG BY MOUTH DAILY   *If you need a refill on your cardiac medications before your next appointment, please call your pharmacy*   Lab Work:  [facility]--BMET AND PRO-BNP  If you have labs (blood work) drawn today and your tests are completely normal, you will receive your results only by: [place] (if you have MyChart) OR A paper copy in the mail If you have any lab test that is abnormal or we need to change your treatment, we will call you to review the results.    Follow-Up:  3 MONTH WITH AN EXTENDER IN THE OFFICE--SCHEDULE WITH AN EXTENDER PER DR. Johney Frame --VISIT IS FOR BLOOD PRESSURE MANAGEMENT    Signed, Freada Bergeron, MD  04/29/2022 1:43 PM    [place]

## 2022-04-29 ENCOUNTER — Encounter: Payer: Self-pay | Admitting: Cardiology

## 2022-04-29 ENCOUNTER — Ambulatory Visit: Payer: Medicare PPO | Attending: Cardiology | Admitting: Cardiology

## 2022-04-29 VITALS — BP 144/80 | HR 69 | Ht 65.5 in | Wt 214.8 lb

## 2022-04-29 DIAGNOSIS — I10 Essential (primary) hypertension: Secondary | ICD-10-CM

## 2022-04-29 DIAGNOSIS — Z79899 Other long term (current) drug therapy: Secondary | ICD-10-CM

## 2022-04-29 DIAGNOSIS — E119 Type 2 diabetes mellitus without complications: Secondary | ICD-10-CM

## 2022-04-29 DIAGNOSIS — D6869 Other thrombophilia: Secondary | ICD-10-CM

## 2022-04-29 DIAGNOSIS — I2510 Atherosclerotic heart disease of native coronary artery without angina pectoris: Secondary | ICD-10-CM

## 2022-04-29 DIAGNOSIS — R600 Localized edema: Secondary | ICD-10-CM | POA: Diagnosis not present

## 2022-04-29 DIAGNOSIS — I480 Paroxysmal atrial fibrillation: Secondary | ICD-10-CM | POA: Diagnosis not present

## 2022-04-29 DIAGNOSIS — G4733 Obstructive sleep apnea (adult) (pediatric): Secondary | ICD-10-CM

## 2022-04-29 MED ORDER — SPIRONOLACTONE 25 MG PO TABS: 25.0000 mg | ORAL_TABLET | Freq: Every day | ORAL | 1 refills | Status: DC

## 2022-04-29 MED ORDER — CHLORTHALIDONE 25 MG PO TABS: 25.0000 mg | ORAL_TABLET | Freq: Every day | ORAL | 1 refills | Status: DC

## 2022-04-29 MED ORDER — METOPROLOL SUCCINATE ER 100 MG PO TB24: 100.0000 mg | ORAL_TABLET | Freq: Every day | ORAL | 1 refills | Status: DC

## 2022-04-29 NOTE — Patient Instructions (Signed)
Medication Instructions:   STOP TAKING CARVEDILOL (COREG) NOW   STOP TAKING SPIRONOLACTONE-HYDROCHLOROTHIAZIDE NOW  START TAKING METOPROLOL SUCCINATE (TOPROL XL) 100 MG BY MOUTH DAILY  START TAKING SPIRONOLACTONE 25 MG BY MOUTH DAILY  START TAKING CHLORTHALIDONE 25 MG BY MOUTH DAILY   *If you need a refill on your cardiac medications before your next appointment, please call your pharmacy*   Lab Work:  [place] OFFICE--BMET AND PRO-BNP  If you have labs (blood work) drawn today and your tests are completely normal, you will receive your results only by: MyChart Message (if you have MyChart) OR A paper copy in the mail If you have any lab test that is abnormal or we need to change your treatment, we will call you to review the results.    Follow-Up:  3 MONTH WITH AN EXTENDER IN THE OFFICE--SCHEDULE WITH AN EXTENDER PER DR. Johney Frame --VISIT IS FOR BLOOD PRESSURE MANAGEMENT

## 2022-05-04 ENCOUNTER — Ambulatory Visit: Payer: Medicare PPO | Admitting: Internal Medicine

## 2022-05-04 ENCOUNTER — Encounter: Payer: Self-pay | Admitting: Internal Medicine

## 2022-05-04 VITALS — BP 138/76 | HR 65 | Temp 98.3°F | Ht 65.0 in | Wt 214.8 lb

## 2022-05-04 DIAGNOSIS — I1310 Hypertensive heart and chronic kidney disease without heart failure, with stage 1 through stage 4 chronic kidney disease, or unspecified chronic kidney disease: Secondary | ICD-10-CM

## 2022-05-04 DIAGNOSIS — Z6835 Body mass index (BMI) 35.0-35.9, adult: Secondary | ICD-10-CM

## 2022-05-04 DIAGNOSIS — Z794 Long term (current) use of insulin: Secondary | ICD-10-CM

## 2022-05-04 DIAGNOSIS — N1831 Chronic kidney disease, stage 3a: Secondary | ICD-10-CM | POA: Diagnosis not present

## 2022-05-04 DIAGNOSIS — E1122 Type 2 diabetes mellitus with diabetic chronic kidney disease: Secondary | ICD-10-CM

## 2022-05-04 DIAGNOSIS — I739 Peripheral vascular disease, unspecified: Secondary | ICD-10-CM

## 2022-05-04 DIAGNOSIS — G4733 Obstructive sleep apnea (adult) (pediatric): Secondary | ICD-10-CM

## 2022-05-04 MED ORDER — NOVOLOG MIX 70/30 FLEXPEN (70-30) 100 UNIT/ML ~~LOC~~ SUPN: PEN_INJECTOR | SUBCUTANEOUS | 2 refills | Status: DC

## 2022-05-04 NOTE — Progress Notes (Signed)
I,Victoria T Hamilton,acting as a scribe for [name], MD.,have documented all relevant documentation on the behalf of [name], MD,as directed by  [name], MD while in the presence of [name], MD.    Subjective:     Patient ID: Dawn Wood , female    DOB: 15-Jan-1946 , 77 y.o.   MRN: [id number]   Chief Complaint  Patient presents with   Diabetes   Hypertension    HPI  Patient presents today for a HTN/DM check. She reports compliance with meds. She denies headaches, chest pain and shortness of breath.  She is upset with her previous ophthalmologist. She reports blurry vision, she states the specialist lost a piece of artificial lens in her eye. She is now followed at [facility].  Shehas to go back for eye surgery tomorrow.  She was advised to NEVER return to her previous ophthalmologist.    She adds, her sugars have been fluctuating. She denies a change in her eating habits, but does admit she is not moving as much since she has retired.  Sugar taken today: 172  She visited cardiologist on 04/29/22. She was taken off of Carvedilol 25MG   & put on Cholrthalidone 25MG  & Spironolactone 25MG . Pt reports she was also told to resume Metoprolol.    Diabetes She presents for her follow-up diabetic visit. She has type 2 diabetes mellitus. Her disease course has been stable. There are no hypoglycemic associated symptoms. Pertinent negatives for diabetes include no blurred vision, no polydipsia, no polyphagia and no polyuria. There are no hypoglycemic complications. Diabetic complications include nephropathy. Risk factors for coronary artery disease include diabetes mellitus, dyslipidemia, hypertension, obesity, sedentary lifestyle and post-menopausal. Current diabetic treatment includes insulin injections. She is following a diabetic diet. She participates in exercise intermittently. Her home blood glucose trend is fluctuating minimally. Her breakfast blood glucose is  taken between 8-9 am. Her breakfast blood glucose range is generally 110-130 mg/dl. Eye exam is current.  Hypertension This is a chronic problem. The current episode started more than 1 year ago. The problem has been gradually improving since onset. Pertinent negatives include no blurred vision. Risk factors for coronary artery disease include diabetes mellitus, dyslipidemia, sedentary lifestyle and post-menopausal state. Hypertensive end-organ damage includes kidney disease.     Past Medical History:  Diagnosis Date   Coronary artery disease    Diabetes mellitus without complication (HCC)    GERD (gastroesophageal reflux disease)    Hypertension    MI (myocardial infarction) ([place])    Peripheral vascular disease ([place])      Family History  Problem Relation Age of Onset   Hypertension Mother    Cancer Father    Hypertension Father    Breast cancer Sister 51   Aortic stenosis Daughter    Heart disease Daughter        before age 75   Thyroid disease Daughter    Sleep apnea Neg Hx      Current Outpatient Medications:    amLODipine (NORVASC) 5 MG tablet, Take 1 tablet (5 mg total) by mouth daily., Disp: 90 tablet, Rfl: 3   apixaban (ELIQUIS) 5 MG TABS tablet, Take 1 tablet (5 mg total) by mouth 2 (two) times daily., Disp: 180 tablet, Rfl: 1   BD PEN NEEDLE NANO 2ND GEN 32G X 4 MM MISC, USE AS DIRECTED TWICE DAILY, Disp: 100 each, Rfl: 2   chlorthalidone (HYGROTON) 25 MG tablet, Take 1 tablet (25 mg total) by mouth  daily., Disp: 90 tablet, Rfl: 1   Cholecalciferol (VITAMIN D3) 2000 units TABS, Take 1 tablet by mouth daily., Disp: , Rfl:    clopidogrel (PLAVIX) 75 MG tablet, Take 1 tablet (75 mg total) by mouth daily., Disp: 90 tablet, Rfl: 3   Coenzyme Q10 (CO Q 10) 10 MG CAPS, Take by mouth daily., Disp: , Rfl:    colchicine 0.6 MG tablet, Take 1 tablet (0.6 mg total) by mouth 2 (two) times daily., Disp: 180 tablet, Rfl: 3   Evolocumab (REPATHA SURECLICK) 858 MG/ML SOAJ, Inject 140 mg  into the skin every 14 (fourteen) days., Disp: 6 mL, Rfl: 3   famotidine (PEPCID) 20 MG tablet, TAKE 1 TABLET(20 MG) BY MOUTH TWICE DAILY, Disp: 180 tablet, Rfl: 1   fluticasone (FLONASE) 50 MCG/ACT nasal spray, SHAKE LIQUID AND USE 1 SPRAY IN EACH NOSTRIL DAILY AS NEEDED FOR ALLERGIES OR RHINITIS, Disp: 16 g, Rfl: 2   gabapentin (NEURONTIN) 100 MG capsule, One tab po qhs x 1 week, then 2 tabs po qhs, Disp: 180 capsule, Rfl: 0   lidocaine-prilocaine (EMLA) cream, , Disp: , Rfl:    metoprolol succinate (TOPROL-XL) 100 MG 24 hr tablet, Take 1 tablet (100 mg total) by mouth daily. Take with or immediately following a meal., Disp: 90 tablet, Rfl: 1   nitroGLYCERIN (NITROSTAT) 0.4 MG SL tablet, Place 1 tablet (0.4 mg total) under the tongue every 5 (five) minutes as needed for chest pain., Disp: 25 tablet, Rfl: 3   olmesartan (BENICAR) 40 MG tablet, TAKE 1 TABLET(40 MG) BY MOUTH DAILY, Disp: 90 tablet, Rfl: 2   Semaglutide,0.25 or 0.5MG /DOS, (OZEMPIC, 0.25 OR 0.5 MG/DOSE,) 2 MG/1.5ML SOPN, INJECT 0.5MG  INTO SKIN ONCE A WEEK, Disp: 4.5 mL, Rfl: 3   spironolactone (ALDACTONE) 25 MG tablet, Take 1 tablet (25 mg total) by mouth daily., Disp: 90 tablet, Rfl: 1   insulin aspart protamine - aspart (NOVOLOG MIX 70/30 FLEXPEN) (70-30) 100 UNIT/ML FlexPen, INJECT 40 UNITS SUBCUTANEOUS EVERY MORNING AND 50 UNITS SUBCUTANEOUS AT DINNER, Disp: 75 mL, Rfl: 2   ONETOUCH VERIO test strip, USE TO CHECK BLOOD SUGARS TWICE DAILY AS DIRECTED, Disp: 100 strip, Rfl: 3  Current Facility-Administered Medications:    nitroGLYCERIN (NITROSTAT) SL tablet 0.4 mg, 0.4 mg, Sublingual, Q5 min PRN, Conte, Tessa N, PA-C   Allergies  Allergen Reactions   Dust Mite Extract Other (See Comments)    REACTION: SNEEZING   Other Other (See Comments)    ALLERGEN: DEODORIZERS SPRAY AND PERFUMES REACTION: SNEEZING   Statins Other (See Comments)    Muscle aches with rosuvastatin 5 mg daily and 3x/weekly, atorvastatin 20 mg daily and 3x/week,  simvastatin 40 mg daily    Allopurinol     Diarrhea and the pt said it made her sugars low   Pollen Extract Other (See Comments)    Reaction unknown     Review of Systems  Constitutional: Negative.   Eyes:  Negative for blurred vision.  Respiratory: Negative.    Cardiovascular: Negative.   Gastrointestinal: Negative.   Endocrine: Negative for polydipsia, polyphagia and polyuria.  Neurological: Negative.   Psychiatric/Behavioral: Negative.       Today's Vitals   05/04/22 1042  BP: 138/76  Pulse: 65  Temp: 98.3 F (36.8 C)  SpO2: 96%  Weight: 214 lb 12.8 oz (97.4 kg)  Height: 5\' 5"  (1.651 m)   Body mass index is 35.74 kg/m.  Wt Readings from Last 3 Encounters:  05/04/22 214 lb 12.8 oz (97.4 kg)  04/29/22 214 lb 12.8 oz (97.4 kg)  03/19/22 209 lb (94.8 kg)    Objective:  Physical Exam Vitals and nursing note reviewed.  Constitutional:      Appearance: Normal appearance.  HENT:     Head: Normocephalic and atraumatic.     Nose:     Comments: Masked     Mouth/Throat:     Comments: Masked  Eyes:     Extraocular Movements: Extraocular movements intact.  Cardiovascular:     Rate and Rhythm: Normal rate and regular rhythm.     Heart sounds: Normal heart sounds.  Pulmonary:     Effort: Pulmonary effort is normal.     Breath sounds: Normal breath sounds.  Musculoskeletal:     Cervical back: Normal range of motion.  Skin:    General: Skin is warm.  Neurological:     General: No focal deficit present.     Mental Status: She is alert.  Psychiatric:        Mood and Affect: Mood normal.        Behavior: Behavior normal.         Assessment And Plan:     1. Type 2 diabetes mellitus with stage 3a chronic kidney disease, with long-term current use of insulin (HCC) Comments: Chronic, reminded to avoid NSAIDS, keep BP/DM controlled and stay hydrated to decrease risk of CKD progression. - Hemoglobin A1c - Lipid panel - BMP8+eGFR - insulin aspart protamine - aspart  (NOVOLOG MIX 70/30 FLEXPEN) (70-30) 100 UNIT/ML FlexPen; INJECT 40 UNITS SUBCUTANEOUS EVERY MORNING AND 50 UNITS SUBCUTANEOUS AT DINNER  Dispense: 75 mL; Refill: 2 - Microalbumin / creatinine urine ratio  2. Hypertensive heart and renal disease with renal failure, stage 1 through stage 4 or unspecified chronic kidney disease, without heart failure Comments: Fair control, goal BP< 130/80. Carvedilol switched to metoprolol by Cardiology. Encouraged to follow low sodium diet. - Lipid panel - BMP8+eGFR  3. PVD (peripheral vascular disease) (HCC) Comments: Chronic. Importance of statin compliance and regular exercise was stressed to the patient.  4. OSA (obstructive sleep apnea) Comments: Not yet on CPAP. Scheduled for in-home sleep study next month. PT advised untreated OSA is absolutely contributing to her sx.  5. Class 2 severe obesity due to excess calories with serious comorbidity and body mass index (BMI) of 35.0 to 35.9 in adult [facility]) Comments: She is encouraged to aim for at least 150 minutes of exercise/week, while striving for BMI<30 to decrease cardiac riks.  Patient was given opportunity to ask questions. Patient verbalized understanding of the plan and was able to repeat key elements of the plan. All questions were answered to their satisfaction.   I, [name], MD, have reviewed all documentation for this visit. The documentation on 05/19/22 for the exam, diagnosis, procedures, and orders are all accurate and complete.   IF YOU HAVE BEEN REFERRED TO A SPECIALIST, IT MAY TAKE 1-2 WEEKS TO SCHEDULE/PROCESS THE REFERRAL. IF YOU HAVE NOT HEARD FROM US/SPECIALIST IN TWO WEEKS, PLEASE GIVE [place] A CALL AT [phone].   THE PATIENT IS ENCOURAGED TO PRACTICE SOCIAL DISTANCING DUE TO THE COVID-19 PANDEMIC.

## 2022-05-04 NOTE — Patient Instructions (Signed)

## 2022-05-05 LAB — BMP8+EGFR
BUN/Creatinine Ratio: 15 (ref 12–28)
BUN: 15 mg/dL (ref 8–27)
CO2: 24 mmol/L (ref 20–29)
Calcium: 9.7 mg/dL (ref 8.7–10.3)
Chloride: 101 mmol/L (ref 96–106)
Creatinine, Ser: 0.98 mg/dL (ref 0.57–1.00)
Glucose: 119 mg/dL — ABNORMAL HIGH (ref 70–99)
Potassium: 3.9 mmol/L (ref 3.5–5.2)
Sodium: 140 mmol/L (ref 134–144)
eGFR: 59 mL/min/{1.73_m2} — ABNORMAL LOW (ref >59)

## 2022-05-05 LAB — LIPID PANEL
Chol/HDL Ratio: 4 ratio (ref 0.0–4.4)
Cholesterol, Total: 195 mg/dL (ref 100–199)
HDL: 49 mg/dL (ref >39)
LDL Chol Calc (NIH): 121 mg/dL — ABNORMAL HIGH (ref 0–99)
Triglycerides: 140 mg/dL (ref 0–149)
VLDL Cholesterol Cal: 25 mg/dL (ref 5–40)

## 2022-05-05 LAB — MICROALBUMIN / CREATININE URINE RATIO
Creatinine, Urine: 59.3 mg/dL
Microalb/Creat Ratio: 50 mg/g creat — ABNORMAL HIGH (ref 0–29)
Microalbumin, Urine: 29.7 ug/mL

## 2022-05-05 LAB — HEMOGLOBIN A1C
Est. average glucose Bld gHb Est-mCnc: 157 mg/dL
Hgb A1c MFr Bld: 7.1 % — ABNORMAL HIGH (ref 4.8–5.6)

## 2022-05-10 ENCOUNTER — Ambulatory Visit: Payer: Medicare PPO | Attending: Cardiology

## 2022-05-10 DIAGNOSIS — I10 Essential (primary) hypertension: Secondary | ICD-10-CM

## 2022-05-10 DIAGNOSIS — Z79899 Other long term (current) drug therapy: Secondary | ICD-10-CM

## 2022-05-10 DIAGNOSIS — R600 Localized edema: Secondary | ICD-10-CM

## 2022-05-12 LAB — BASIC METABOLIC PANEL
BUN/Creatinine Ratio: 16 (ref 12–28)
BUN: 19 mg/dL (ref 8–27)
CO2: 21 mmol/L (ref 20–29)
Calcium: 9.7 mg/dL (ref 8.7–10.3)
Chloride: 103 mmol/L (ref 96–106)
Creatinine, Ser: 1.18 mg/dL — ABNORMAL HIGH (ref 0.57–1.00)
Glucose: 117 mg/dL — ABNORMAL HIGH (ref 70–99)
Potassium: 4.2 mmol/L (ref 3.5–5.2)
Sodium: 139 mmol/L (ref 134–144)
eGFR: 48 mL/min/{1.73_m2} — ABNORMAL LOW (ref >59)

## 2022-05-12 LAB — PRO B NATRIURETIC PEPTIDE: NT-Pro BNP: 70 pg/mL (ref 0–738)

## 2022-05-18 ENCOUNTER — Other Ambulatory Visit: Payer: Self-pay | Admitting: Internal Medicine

## 2022-05-21 ENCOUNTER — Other Ambulatory Visit: Payer: Self-pay

## 2022-05-21 MED ORDER — ACCU-CHEK GUIDE VI STRP: ORAL_STRIP | 12 refills | Status: DC

## 2022-05-21 MED ORDER — ACCU-CHEK GUIDE VI STRP: ORAL_STRIP | 12 refills | Status: AC

## 2022-05-25 ENCOUNTER — Ambulatory Visit (INDEPENDENT_AMBULATORY_CARE_PROVIDER_SITE_OTHER): Payer: Medicare PPO | Admitting: Neurology

## 2022-05-25 DIAGNOSIS — G4733 Obstructive sleep apnea (adult) (pediatric): Secondary | ICD-10-CM

## 2022-05-26 ENCOUNTER — Other Ambulatory Visit: Payer: Self-pay

## 2022-05-26 MED ORDER — ACCU-CHEK SOFTCLIX LANCETS MISC: 12 refills | Status: DC

## 2022-05-26 MED ORDER — ACCU-CHEK GUIDE W/DEVICE KIT: PACK | 0 refills | Status: AC

## 2022-05-27 NOTE — Addendum Note (Signed)
Addended by: Star Age on: 05/27/2022 04:38 PM   Modules accepted: Orders

## 2022-05-27 NOTE — Procedures (Signed)
[facility]  HOME SLEEP TEST (Watch PAT) REPORT  STUDY DATE: 05/25/2022  DOB: 03/30/1945  MRN: [id number]  ORDERING CLINICIAN: Star Age, MD, PhD   REFERRING CLINICIAN: Glendale Chard, MD   CLINICAL INFORMATION/HISTORY: 77 year old right-handed woman with an underlying medical history of back pain, gout, coronary artery disease with history of MI, hypertension, peripheral vascular disease, reflux disease, diabetes and obesity, who presents for reevaluation of her obstructive sleep apnea.  Epworth sleepiness score: 15/24.  BMI: 35.3 kg/m  FINDINGS:   Sleep Summary:   Total Recording Time (hours, min): 9 hours, 20 min  Total Sleep Time (hours, min):  8 hours, 7 min  Percent REM (%):    26.5%   Respiratory Indices:   Calculated pAHI (per hour):  29.6/hour      12.5/h (with 4% desaturation criteria for hypopneas per Medicare guidelines)   REM pAHI:    53.3/hour       NREM pAHI: 22.2/hour  Central pAHI: 2.1/hour  Oxygen Saturation Statistics:    Oxygen Saturation (%) Mean: 94%   Minimum oxygen saturation (%):                 88%   O2 Saturation Range (%): 88 - 99%    O2 Saturation (minutes) <=88%: 0.1 min  Pulse Rate Statistics:   Pulse Mean (bpm):    58/min    Pulse Range (42 -84/min)   IMPRESSION: OSA (obstructive sleep apnea)   RECOMMENDATION:  This home sleep test demonstrates overall mild obstructive sleep apnea (using the 4% desaturation criteria for hypopneas per Medicare guidelines) with a total AHI of 12.5/hour and O2 nadir of 88%. Snoring was detected, and appeared to be in the mild to moderate range, fairly consistently moderate in the first half of the study and then more intermittent, at times in the louder range. Given the patient's medical history and sleep related complaints, therapy with a  positive airway pressure device is a reasonable first-line choice and clinically recommended. Treatment can be achieved in the form of  autoPAP trial/titration at home for now. A full night, in-lab PAP titration study may aid in improving proper treatment settings and with mask fit, if needed, down the road. Alternative treatments may include weight loss (where appropriate) along with avoidance of the supine sleep position (if possible), or an oral appliance in appropriate candidates.   Please note that untreated obstructive sleep apnea may carry additional perioperative morbidity. Patients with significant obstructive sleep apnea should receive perioperative PAP therapy and the surgeons and particularly the anesthesiologist should be informed of the diagnosis and the severity of the sleep disordered breathing. The patient should be cautioned not to drive, work at heights, or operate dangerous or heavy equipment when tired or sleepy. Review and reiteration of good sleep hygiene measures should be pursued with any patient. Other causes of the patient's symptoms, including circadian rhythm disturbances, an underlying mood disorder, medication effect and/or an underlying medical problem cannot be ruled out based on this test. Clinical correlation is recommended.  The patient and her referring provider will be notified of the test results. The patient will be seen in follow up in sleep clinic at [facility], as necessary.  I certify that I have reviewed the raw data recording prior to the issuance of this report in accordance with the standards of the American Academy of Sleep Medicine (AASM).    INTERPRETING PHYSICIAN:   Star Age, MD, PhD Medical Director, [facility]) Diplomat,  ABPN (Neurology and Sleep)   [facility] [address], [place], [place] [address] [phone]

## 2022-06-03 ENCOUNTER — Telehealth: Payer: Self-pay | Admitting: *Deleted

## 2022-06-03 NOTE — Telephone Encounter (Signed)
I spoke with the patient we discussed her sleep study results.  The patient had several questions which I was able to answer to the best of my ability.  She is amenable to trying AutoPap.  We discussed the insurance compliance requirements which includes using the machine for at least 4 hours at night and also being seen in our office for an initial follow-up appointment between 30 and 90 days after set up on the machine.  We will try to refer to [facility] first.  I provided patient with their phone number. Pt scheduled the initial f/u for 08/26/22 at 1015 am arrival 945 am. She verbalized appreciation for the call.   Referral faxed to [facility]. Received a receipt of confirmation. Report sent to referring provider. I called [facility] and confirmed they take [facility] PPO.

## 2022-06-03 NOTE — Telephone Encounter (Signed)
-----   Message from Star Age, MD sent at 05/27/2022  4:38 PM EDT ----- Patient was seen by me on 07/30/2021 for reevaluation of her sleep apnea, she had a repeat sleep test at home on 05/25/2022 which indicated overall mild sleep apnea.  She would be willing to proceed with home AutoPap therapy, I have placed an order for this. The DME representative will educate her on how to use the machine, how to put the mask on, etc. I have placed an order in the chart. Please send referral, talk to patient, send report to referring MD. We will need a FU in sleep clinic for 10 weeks post-PAP set up, please arrange that with me or one of our NPs. Thanks,   Star Age, MD, PhD [facility])

## 2022-06-07 ENCOUNTER — Other Ambulatory Visit: Payer: Medicare PPO

## 2022-06-23 ENCOUNTER — Encounter: Payer: Self-pay | Admitting: Internal Medicine

## 2022-06-23 DIAGNOSIS — Z1231 Encounter for screening mammogram for malignant neoplasm of breast: Secondary | ICD-10-CM

## 2022-06-28 ENCOUNTER — Encounter: Payer: Self-pay | Admitting: Internal Medicine

## 2022-06-28 ENCOUNTER — Ambulatory Visit: Payer: Medicare PPO | Admitting: Internal Medicine

## 2022-06-28 VITALS — BP 142/98 | HR 70 | Temp 98.9°F | Ht 65.0 in | Wt 210.4 lb

## 2022-06-28 DIAGNOSIS — Z6835 Body mass index (BMI) 35.0-35.9, adult: Secondary | ICD-10-CM

## 2022-06-28 DIAGNOSIS — N644 Mastodynia: Secondary | ICD-10-CM | POA: Diagnosis not present

## 2022-06-28 DIAGNOSIS — I2510 Atherosclerotic heart disease of native coronary artery without angina pectoris: Secondary | ICD-10-CM | POA: Diagnosis not present

## 2022-06-28 DIAGNOSIS — I1310 Hypertensive heart and chronic kidney disease without heart failure, with stage 1 through stage 4 chronic kidney disease, or unspecified chronic kidney disease: Secondary | ICD-10-CM

## 2022-06-28 NOTE — Patient Instructions (Signed)
Breast Tenderness Breast tenderness is a common problem for women of all ages, but may also occur in men. Breast tenderness has many possible causes, including hormone changes, infections, taking certain medicines, and caffeine intake. In women, the pain usually comes and goes with the menstrual cycle, but it can also be constant. Breast tenderness may range from mild discomfort to severe pain. You may have tests, such as a mammogram or an ultrasound, to check for any unusual findings. Having breast tenderness usually does not mean that you have breast cancer. Follow these instructions at home: Managing pain and discomfort  If directed, put ice on the painful area. To do this: Put ice in a plastic bag. Place a towel between your skin and the bag. Leave the ice on for 20 minutes, 2-3 times a day. If your skin turns bright red, remove the ice right away to prevent skin damage. The risk of skin damage is higher if you cannot feel pain, heat, or cold. Wear a supportive bra or chest support: During exercise. While sleeping, if your breasts are very tender. Medicines Take over-the-counter and prescription medicines only as told by your health care provider. If the cause of your pain is an infection, you may be prescribed an antibiotic medicine. If you were prescribed antibiotics, take them as told by your health care provider. Do not stop using the antibiotic even if you start to feel better. Eating and drinking Decrease the amount of caffeine in your diet. Instead, drink more water and choose caffeine-free drinks. Your health care provider may recommend that you lessen the amount of fat in your diet. You can do this by: Limiting fried foods. Cooking foods using methods such as baking, boiling, grilling, and broiling. General instructions  Keep a log of the days and times when your breasts are most tender. Ask your health care provider how to do breast exams at home. This will help you notice if  you have an unusual growth or lump. Keep all follow-up visits. Contact a health care provider if: Any part of your breast is hard, red, and hot to the touch. This may be a sign of infection. You are a woman and have a new or painful lump in your breast that remains after your menstrual period ends. You are not breastfeeding and you have fluid, especially blood or pus, coming out of your nipples. You have a fever. Your pain does not improve or it gets worse. Your pain is interfering with your daily activities. Summary Breast tenderness may range from mild discomfort to severe pain. Breast tenderness has many possible causes, including hormone changes, infections, taking certain medicines, and caffeine intake. It can be treated with ice, wearing a supportive bra or chest support, and medicines. Make changes to your diet as told by your health care provider. This information is not intended to replace advice given to you by your health care provider. Make sure you discuss any questions you have with your health care provider. Document Revised: 05/06/2021 Document Reviewed: 05/06/2021 Elsevier Patient Education  2023 [employer]  

## 2022-06-28 NOTE — Progress Notes (Signed)
Hershal Coria Martin,acting as a Neurosurgeon for [name], MD.,have documented all relevant documentation on the behalf of [name], MD,as directed by  [name], MD while in the presence of [name], MD.    Subjective:     Patient ID: Dawn Wood , female    DOB: 01/30/1946 , 77 y.o.   MRN: [id number]   Chief Complaint  Patient presents with   Breast Problem    HPI  Pt presents today for right breast pain. Pain in right breast started about a month ago, patient states when she lays on her right side it is very sore. She has been told she has a cyst in that breast.   She reports needing an order for Diagnostic mammogram.   BP Readings from Last 3 Encounters: 06/28/22 : (!) 140/76 05/04/22 : 138/76 04/29/22 : Marland Kitchen 144/80       Past Medical History:  Diagnosis Date   Coronary artery disease    Diabetes mellitus without complication    GERD (gastroesophageal reflux disease)    Hypertension    MI (myocardial infarction)    Peripheral vascular disease      Family History  Problem Relation Age of Onset   Hypertension Mother    Cancer Father    Hypertension Father    Breast cancer Sister 34   Aortic stenosis Daughter    Heart disease Daughter        before age 41   Thyroid disease Daughter    Sleep apnea Neg Hx      Current Outpatient Medications:    Accu-Chek Softclix Lancets lancets, USE TO CHECK BLOOD SUGARS ONCE DAILY., Disp: 100 each, Rfl: 12   amLODipine (NORVASC) 5 MG tablet, Take 1 tablet (5 mg total) by mouth daily., Disp: 90 tablet, Rfl: 3   apixaban (ELIQUIS) 5 MG TABS tablet, Take 1 tablet (5 mg total) by mouth 2 (two) times daily., Disp: 180 tablet, Rfl: 1   BD PEN NEEDLE NANO 2ND GEN 32G X 4 MM MISC, USE AS DIRECTED TWICE DAILY, Disp: 100 each, Rfl: 2   Blood Glucose Monitoring Suppl (ACCU-CHEK GUIDE) w/Device KIT, USE TO CHECK BLOOD SUGARS ONCE DAILY., Disp: 1 kit, Rfl: 0   chlorthalidone (HYGROTON) 25 MG tablet, Take 1  tablet (25 mg total) by mouth daily., Disp: 90 tablet, Rfl: 1   Cholecalciferol (VITAMIN D3) 2000 units TABS, Take 1 tablet by mouth daily., Disp: , Rfl:    clopidogrel (PLAVIX) 75 MG tablet, Take 1 tablet (75 mg total) by mouth daily., Disp: 90 tablet, Rfl: 3   Coenzyme Q10 (CO Q 10) 10 MG CAPS, Take by mouth daily., Disp: , Rfl:    colchicine 0.6 MG tablet, Take 1 tablet (0.6 mg total) by mouth 2 (two) times daily., Disp: 180 tablet, Rfl: 3   Evolocumab (REPATHA SURECLICK) 140 MG/ML SOAJ, Inject 140 mg into the skin every 14 (fourteen) days., Disp: 6 mL, Rfl: 3   famotidine (PEPCID) 20 MG tablet, TAKE 1 TABLET(20 MG) BY MOUTH TWICE DAILY, Disp: 180 tablet, Rfl: 1   fluticasone (FLONASE) 50 MCG/ACT nasal spray, SHAKE LIQUID AND USE 1 SPRAY IN EACH NOSTRIL DAILY AS NEEDED FOR ALLERGIES OR RHINITIS, Disp: 16 g, Rfl: 2   gabapentin (NEURONTIN) 100 MG capsule, One tab po qhs x 1 week, then 2 tabs po qhs, Disp: 180 capsule, Rfl: 0   glucose blood (ACCU-CHEK GUIDE) test strip, USE TO CHECK BLOOD SUGARS ONCE DAILY., Disp: 100 each,  Rfl: 12   insulin aspart protamine - aspart (NOVOLOG MIX 70/30 FLEXPEN) (70-30) 100 UNIT/ML FlexPen, INJECT 40 UNITS SUBCUTANEOUS EVERY MORNING AND 50 UNITS SUBCUTANEOUS AT DINNER, Disp: 75 mL, Rfl: 2   lidocaine-prilocaine (EMLA) cream, , Disp: , Rfl:    metoprolol succinate (TOPROL-XL) 100 MG 24 hr tablet, Take 1 tablet (100 mg total) by mouth daily. Take with or immediately following a meal., Disp: 90 tablet, Rfl: 1   nitroGLYCERIN (NITROSTAT) 0.4 MG SL tablet, Place 1 tablet (0.4 mg total) under the tongue every 5 (five) minutes as needed for chest pain., Disp: 25 tablet, Rfl: 3   olmesartan (BENICAR) 40 MG tablet, TAKE 1 TABLET(40 MG) BY MOUTH DAILY, Disp: 90 tablet, Rfl: 2   ONETOUCH VERIO test strip, USE TO CHECK BLOOD SUGARS TWICE DAILY AS DIRECTED, Disp: 100 strip, Rfl: 3   Semaglutide,0.25 or 0.5MG /DOS, (OZEMPIC, 0.25 OR 0.5 MG/DOSE,) 2 MG/1.5ML SOPN, INJECT 0.5MG  INTO  SKIN ONCE A WEEK, Disp: 4.5 mL, Rfl: 3   spironolactone (ALDACTONE) 25 MG tablet, Take 1 tablet (25 mg total) by mouth daily., Disp: 90 tablet, Rfl: 1  Current Facility-Administered Medications:    nitroGLYCERIN (NITROSTAT) SL tablet 0.4 mg, 0.4 mg, Sublingual, Q5 min PRN, Conte, Tessa N, PA-C   Allergies  Allergen Reactions   Dust Mite Extract Other (See Comments)    REACTION: SNEEZING   Other Other (See Comments)    ALLERGEN: DEODORIZERS SPRAY AND PERFUMES REACTION: SNEEZING   Statins Other (See Comments)    Muscle aches with rosuvastatin 5 mg daily and 3x/weekly, atorvastatin 20 mg daily and 3x/week, simvastatin 40 mg daily    Allopurinol     Diarrhea and the pt said it made her sugars low   Pollen Extract Other (See Comments)    Reaction unknown     Review of Systems  Constitutional: Negative.   Respiratory: Negative.    Cardiovascular: Negative.   Musculoskeletal: Negative.   Skin: Negative.   Neurological: Negative.   Psychiatric/Behavioral: Negative.       Today's Vitals   06/28/22 0905 06/28/22 0920  BP: (!) 140/76 (!) 142/98  Pulse: 70   Temp: 98.9 F (37.2 C)   TempSrc: Oral   Weight: 210 lb 6.4 oz (95.4 kg)   Height: 5\' 5"  (1.651 m)   PainSc: 7    PainLoc: Breast    Body mass index is 35.01 kg/m.  Wt Readings from Last 3 Encounters:  06/28/22 210 lb 6.4 oz (95.4 kg)  05/04/22 214 lb 12.8 oz (97.4 kg)  04/29/22 214 lb 12.8 oz (97.4 kg)    Objective:  Physical Exam Vitals and nursing note reviewed.  Constitutional:      Appearance: Normal appearance.  HENT:     Head: Normocephalic and atraumatic.  Eyes:     Extraocular Movements: Extraocular movements intact.  Cardiovascular:     Rate and Rhythm: Normal rate and regular rhythm.     Heart sounds: Normal heart sounds.  Pulmonary:     Effort: Pulmonary effort is normal.     Breath sounds: Normal breath sounds.  Chest:  Breasts:    Tanner Score is 5.     Right: Tenderness present.     Left:  Normal.  Musculoskeletal:     Cervical back: Normal range of motion.  Skin:    General: Skin is warm.  Neurological:     General: No focal deficit present.     Mental Status: She is alert.  Psychiatric:  Mood and Affect: Mood normal.        Behavior: Behavior normal.      Assessment And Plan:     1. Breast pain, right Comments: Previous mammo and u/s results reviewed. There is tenderness to palpation above and below the areola. - MM Digital Diagnostic Unilat R; Future  2. Hypertensive heart and renal disease with renal failure, stage 1 through stage 4 or unspecified chronic kidney disease, without heart failure Comments: Chronic, uncontrolled. She has scheduled BP/DM check in May 2024, will reassess at that time. Reminded to follow low sodium diet.  3. CAD in native artery Comments: Chronic, she plans to schedule Cardiology appt in the near future. She is on ASA  4. Class 2 severe obesity due to excess calories with serious comorbidity and body mass index (BMI) of 35.0 to 35.9 in adult Comments: She is reminded to incorporate more exercise into her daily routine.     Patient was given opportunity to ask questions. Patient verbalized understanding of the plan and was able to repeat key elements of the plan. All questions were answered to their satisfaction.   I, [name], MD, have reviewed all documentation for this visit. The documentation on 06/28/22 for the exam, diagnosis, procedures, and orders are all accurate and complete.   IF YOU HAVE BEEN REFERRED TO A SPECIALIST, IT MAY TAKE 1-2 WEEKS TO SCHEDULE/PROCESS THE REFERRAL. IF YOU HAVE NOT HEARD FROM US/SPECIALIST IN TWO WEEKS, PLEASE GIVE [place] A CALL AT [phone] X [phone].   THE PATIENT IS ENCOURAGED TO PRACTICE SOCIAL DISTANCING DUE TO THE COVID-19 PANDEMIC.

## 2022-06-30 ENCOUNTER — Other Ambulatory Visit: Payer: Self-pay | Admitting: Internal Medicine

## 2022-06-30 DIAGNOSIS — N644 Mastodynia: Secondary | ICD-10-CM

## 2022-07-06 ENCOUNTER — Telehealth: Payer: Self-pay | Admitting: Cardiology

## 2022-07-06 NOTE — Telephone Encounter (Signed)
STOP TAKING CARVEDILOL (COREG) NOW    STOP TAKING SPIRONOLACTONE-HYDROCHLOROTHIAZIDE NOW   START TAKING METOPROLOL SUCCINATE (TOPROL XL) 100 MG BY MOUTH DAILY   START TAKING SPIRONOLACTONE 25 MG BY MOUTH DAILY   START TAKING CHLORTHALIDONE 25 MG BY MOUTH DAILY    Called Jamie Pharmacist at [facility] back and went over with her as indicated above the pts medication instructions that we advised on at last OV back in Feb.  Asher Muir has all of this updated and was gracious for all the assistance provided.

## 2022-07-06 NOTE — Telephone Encounter (Signed)
Pt c/o medication issue:  1. Name of Medication:  spironolactone (ALDACTONE) 25 MG tablet  chlorthalidone (HYGROTON) 25 MG tablet  2. How are you currently taking this medication (dosage and times per day)?   3. Are you having a reaction (difficulty breathing--STAT)?   4. What is your medication issue?   Asher Muir with [facility] would like to confirm that Spironolactone HCTZ has been discontinued and that patient should only be taking Spironolactone and Chlorthalidone. Please advise.

## 2022-07-08 ENCOUNTER — Telehealth: Payer: Self-pay | Admitting: Neurology

## 2022-07-08 DIAGNOSIS — G4733 Obstructive sleep apnea (adult) (pediatric): Secondary | ICD-10-CM

## 2022-07-08 NOTE — Telephone Encounter (Signed)
I faxed over the order for autopap min change pressure to 7cm to [facility] [phone] with confirmation of fax received.

## 2022-07-08 NOTE — Telephone Encounter (Signed)
Crystal(Respitory Specialist)[facility] reports that pt informed them she had not ben using her CPAP, Crystal said she increased pressure to 6 and pt is happy with that.  She is asking for an order for the setting to be either 6 or 7 for pt.  [phone] is Crystal's personal cell# the office fax # is [phone]

## 2022-07-08 NOTE — Telephone Encounter (Signed)
I called Crystal, with [facility].   pt not tolerating the autopap 5, tried pt on the min 7 keep max 11 she seemed to tolerate to be able to start using.  If ok with this, need order change for [place] to fax.

## 2022-07-08 NOTE — Telephone Encounter (Signed)
We will change AutoPap minimum pressure to 7 cm.

## 2022-07-12 ENCOUNTER — Ambulatory Visit
Admission: RE | Admit: 2022-07-12 | Discharge: 2022-07-12 | Disposition: A | Discharge status: Home / Self Care | Payer: Medicare PPO | Source: Ambulatory Visit | Attending: Internal Medicine | Admitting: Internal Medicine

## 2022-07-12 ENCOUNTER — Other Ambulatory Visit: Payer: Self-pay | Admitting: Internal Medicine

## 2022-07-12 DIAGNOSIS — Z1231 Encounter for screening mammogram for malignant neoplasm of breast: Secondary | ICD-10-CM | POA: Diagnosis not present

## 2022-07-12 DIAGNOSIS — N644 Mastodynia: Secondary | ICD-10-CM

## 2022-07-12 DIAGNOSIS — N6459 Other signs and symptoms in breast: Secondary | ICD-10-CM | POA: Diagnosis not present

## 2022-07-12 DIAGNOSIS — I2510 Atherosclerotic heart disease of native coronary artery without angina pectoris: Secondary | ICD-10-CM

## 2022-07-12 DIAGNOSIS — I1310 Hypertensive heart and chronic kidney disease without heart failure, with stage 1 through stage 4 chronic kidney disease, or unspecified chronic kidney disease: Secondary | ICD-10-CM

## 2022-07-23 ENCOUNTER — Other Ambulatory Visit: Payer: Self-pay | Admitting: Internal Medicine

## 2022-07-23 DIAGNOSIS — Z79899 Other long term (current) drug therapy: Secondary | ICD-10-CM

## 2022-07-23 DIAGNOSIS — R600 Localized edema: Secondary | ICD-10-CM

## 2022-07-23 DIAGNOSIS — I10 Essential (primary) hypertension: Secondary | ICD-10-CM

## 2022-07-24 DIAGNOSIS — G4733 Obstructive sleep apnea (adult) (pediatric): Secondary | ICD-10-CM | POA: Diagnosis not present

## 2022-07-25 NOTE — Progress Notes (Addendum)
Office Visit    Patient Name: Dawn Wood Date of Encounter: 07/26/2022  Primary Care Provider:  Dorothyann Peng, MD Primary Cardiologist:  None Primary Electrophysiologist: None   Past Medical History    Past Medical History:  Diagnosis Date   Coronary artery disease    Diabetes mellitus without complication (HCC)    GERD (gastroesophageal reflux disease)    Hypertension    MI (myocardial infarction) (HCC)    Peripheral vascular disease (HCC)    Past Surgical History:  Procedure Laterality Date   ABDOMINAL HYSTERECTOMY  1985   APPENDECTOMY     CATARACT EXTRACTION Left 02/20/2018   Dr. Harlon Flor   cataract surgery Right 01/25/2022   Second at [facility] 11/29   CORONARY STENT PLACEMENT  2015   ectopic pregnancy--unilateral salpingectomy  1980   KNEE ARTHROSCOPY Left 03/2016   unilateral oophorectomy      Allergies  Allergies  Allergen Reactions   Dust Mite Extract Other (See Comments)    REACTION: SNEEZING   Other Other (See Comments)    ALLERGEN: DEODORIZERS SPRAY AND PERFUMES REACTION: SNEEZING   Statins Other (See Comments)    Muscle aches with rosuvastatin 5 mg daily and 3x/weekly, atorvastatin 20 mg daily and 3x/week, simvastatin 40 mg daily    Allopurinol     Diarrhea and the pt said it made her sugars low   Pollen Extract Other (See Comments)    Reaction unknown     History of Present Illness    Dawn Wood  is a 77 year old female with a PMH of CAD s/p NSTEMI treated with PCI/DES to LAD with 60% RCA and 40% LCx, HTN, HLD, PVD, DM type II, PAF (on Eliquis), arthritis who presents today for 23-month follow-up.   [name] was seen initially by Dr. Katrinka Blazing in 2015 following PCI and NSTEMI and is currently followed by Dr. Shari Prows since 04/2022.  She was seen in the ED at [facility] 05/2013 with complaint of 3 to 4 days of chest pain following a colonoscopy.  She described pain as retrosternal and troponins were elevated at 3.9 with  sinus tach per EKG.  She underwent a LHC with PCI/DES to LAD 60% RCA and 40% LCx treated medically.  She has been followed by Dr. Darrick Penna for management of PAD since 2016.  She had a complaint of chest pain in 05/2021 and underwent a Lexiscan Myoview that was low risk and normal.  She presented on 11/2021 with complaints of heart racing and wore a event monitor that revealed atrial fibrillation.  She was started on Eliquis and Toprol for rate control.  She was seen on 03/2022 with complaints of chest pain that worsened with lying down and improved with sitting up.  She was started on colchicine twice daily metoprolol was changed to carvedilol for better BP control.  2D echo was repeated with LVEF 65-70%, normal RV, no significant valve disease.  She was last seen 04/29/2022 and reported feeling well from CV perspective.  She continues to endorse intermittent chest pain that was atypical and nonexertional.  She was currently undergoing CPAP titration and reported lower extremity swelling since switching to carvedilol.  She was switched back to metoprolol and spironolactone was started along with chlorthalidone.  [name] presents today for 29-month follow-up alone.  Since last being seen in the office patient reports she has been feeling okay but still experiencing occasional bouts of chest tightness with emotional upset.  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  Her blood pressure is well-controlled today at 130/76 and heart rate was 65 bpm.  Patient has not taken her blood pressure medications today prior to her visit.  She has recently completed ocular eye surgery and is no longer on prednisone eyedrops.  She is euvolemic on exam today but does still have trace lower extremity edema bilaterally.  She has compression stockings but reports not wearing them consistently.  She also is in the process of titrating her CPAP but feels like  she is unable to tolerate air pressure when placed on her face.  During today's visit we discussed the importance of diet and weight control with managing hypertension.  We discussed the DASH diet and reviewed strategies to lowering sodium in her diet.  Patient denies palpitations, dyspnea, PND, orthopnea, nausea, vomiting, dizziness, syncope, edema, weight gain, or early satiety.   Home Medications    Current Outpatient Medications  Medication Sig Dispense Refill   Accu-Chek Softclix Lancets lancets USE TO CHECK BLOOD SUGARS ONCE DAILY. 100 each 12   apixaban (ELIQUIS) 5 MG TABS tablet Take 1 tablet (5 mg total) by mouth 2 (two) times daily. 180 tablet 1   BD PEN NEEDLE NANO 2ND GEN 32G X 4 MM MISC USE AS DIRECTED TWICE DAILY 100 each 2   Blood Glucose Monitoring Suppl (ACCU-CHEK GUIDE) w/Device KIT USE TO CHECK BLOOD SUGARS ONCE DAILY. 1 kit 0   chlorthalidone (HYGROTON) 25 MG tablet Take 1 tablet (25 mg total) by mouth daily. 90 tablet 1   Cholecalciferol (VITAMIN D3) 2000 units TABS Take 1 tablet by mouth daily.     clopidogrel (PLAVIX) 75 MG tablet Take 1 tablet (75 mg total) by mouth daily. 90 tablet 3   Coenzyme Q10 (CO Q 10) 10 MG CAPS Take by mouth daily.     colchicine 0.6 MG tablet Take 1 tablet (0.6 mg total) by mouth 2 (two) times daily. 180 tablet 3   Evolocumab (REPATHA SURECLICK) 140 MG/ML SOAJ Inject 140 mg into the skin every 14 (fourteen) days. 6 mL 3   famotidine (PEPCID) 20 MG tablet TAKE 1 TABLET(20 MG) BY MOUTH TWICE DAILY 180 tablet 1   fluticasone (FLONASE) 50 MCG/ACT nasal spray SHAKE LIQUID AND USE 1 SPRAY IN EACH NOSTRIL DAILY AS NEEDED FOR ALLERGIES OR RHINITIS 16 g 2   gabapentin (NEURONTIN) 100 MG capsule One tab po qhs x 1 week, then 2 tabs po qhs 180 capsule 0   glucose blood (ACCU-CHEK GUIDE) test strip USE TO CHECK BLOOD SUGARS ONCE DAILY. 100 each 12   insulin aspart protamine - aspart (NOVOLOG MIX 70/30 FLEXPEN) (70-30) 100 UNIT/ML FlexPen INJECT 40 UNITS  SUBCUTANEOUS EVERY MORNING AND 50 UNITS SUBCUTANEOUS AT DINNER 75 mL 2   isosorbide mononitrate (IMDUR) 30 MG 24 hr tablet Take 0.5-1 tablets (15-30 mg total) by mouth daily. 30 tablet 3   ketorolac (ACULAR) 0.5 % ophthalmic solution Place 1 drop into the right eye 3 (three) times daily.     lidocaine-prilocaine (EMLA) cream      metoprolol succinate (TOPROL-XL) 100 MG 24 hr tablet Take 1 tablet (100 mg total) by mouth daily. Take with or immediately following a meal. 90 tablet 1   nitroGLYCERIN (NITROSTAT) 0.4 MG SL tablet Place 1 tablet (0.4 mg total) under the tongue every 5 (five) minutes as needed for chest pain. 25 tablet 3   olmesartan (BENICAR) 40 MG tablet TAKE  1 TABLET(40 MG) BY MOUTH DAILY 90 tablet 2   ONETOUCH VERIO test strip USE TO CHECK BLOOD SUGARS TWICE DAILY AS DIRECTED 100 strip 3   Semaglutide,0.25 or 0.5MG /DOS, (OZEMPIC, 0.25 OR 0.5 MG/DOSE,) 2 MG/1.5ML SOPN INJECT 0.5MG  INTO SKIN ONCE A WEEK 4.5 mL 3   spironolactone (ALDACTONE) 25 MG tablet Take 1 tablet (25 mg total) by mouth daily. 90 tablet 1   traMADol (ULTRAM) 50 MG tablet Take 50 mg by mouth daily at 6 (six) AM.     VYZULTA 0.024 % SOLN Apply 1 drop to eye in the morning, at noon, and at bedtime.     Current Facility-Administered Medications  Medication Dose Route Frequency Provider Last Rate Last Admin   nitroGLYCERIN (NITROSTAT) SL tablet 0.4 mg  0.4 mg Sublingual Q5 min PRN Sharlene Dory, PA-C         Review of Systems  Please see the history of present illness.    (+) Chest tightness (+) Lower extremity swelling  All other systems reviewed and are otherwise negative except as noted above.  Physical Exam    Wt Readings from Last 3 Encounters:  07/26/22 212 lb 6.4 oz (96.3 kg)  06/28/22 210 lb 6.4 oz (95.4 kg)  05/04/22 214 lb 12.8 oz (97.4 kg)   VS: Vitals:   07/26/22 1341  BP: 130/76  Pulse: 65  SpO2: 95%  ,Body mass index is 35.35 kg/m.  Constitutional:      Appearance: Healthy appearance.  Not in distress.  Neck:     Vascular: JVD normal.  Pulmonary:     Effort: Pulmonary effort is normal.     Breath sounds: No wheezing. No rales. Diminished in the bases Cardiovascular:     Normal rate. Regular rhythm. Normal S1. Normal S2.  Bilateral carotid bruit present    Murmurs: There is no murmur.  Edema:    Peripheral edema absent.  Abdominal:     Palpations: Abdomen is soft non tender. There is no hepatomegaly.  Skin:    General: Skin is warm and dry.  Neurological:     General: No focal deficit present.     Mental Status: Alert and oriented to person, place and time.     Cranial Nerves: Cranial nerves are intact.  EKG/LABS/ Recent Cardiac Studies    ECG personally reviewed by me today -none completed today  Cardiac Studies & Procedures     STRESS TESTS  MYOCARDIAL PERFUSION IMAGING 06/05/2021  Narrative   The study is normal. The study is low risk.   No ST deviation was noted.   Left ventricular function is normal. Nuclear stress EF: 69 %. The left ventricular ejection fraction is hyperdynamic (>65%). End diastolic cavity size is normal.   Prior study available for comparison from 10/22/1998.   ECHOCARDIOGRAM  ECHOCARDIOGRAM COMPLETE 03/20/2022  Narrative ECHOCARDIOGRAM REPORT    Patient Name:   CHERLENE RISSER Inda Date of Exam: 03/19/2022 Medical Rec #:  [id number]              Height:       65.5 in Accession #:    [id number]             Weight:       209.0 lb Date of Birth:  1945-09-09              BSA:          2.027 m Patient Age:    6 years  BP:           138/62 mmHg Patient Gender: F                      HR:           57 bpm. Exam Location:  [address]  Procedure: 2D Echo, Cardiac Doppler, Color Doppler and 3D Echo  Indications:    Chest Pain R07.9  History:        Patient has prior history of Echocardiogram examinations, most recent 06/22/2013. CAD and Previous Myocardial Infarction; Risk Factors:Hypertension and  Diabetes.  Sonographer:    Thurman Coyer RDCS Referring Phys: [phone] TESSA N CONTE   Sonographer Comments: Restricted Mobility. IMPRESSIONS   1. Left ventricular ejection fraction, by estimation, is 65 to 70%. The left ventricle has normal function. The left ventricle has no regional wall motion abnormalities. There is moderate concentric left ventricular hypertrophy. Left ventricular diastolic parameters are indeterminate. 2. Right ventricular systolic function is normal. The right ventricular size is normal. 3. Left atrial size was mild to moderately dilated. 4. Right atrial size was mildly dilated. 5. The mitral valve was not well visualized. No evidence of mitral valve regurgitation. No evidence of mitral stenosis. 6. The aortic valve was not well visualized. There is mild calcification of the aortic valve. Aortic valve regurgitation is not visualized. No aortic stenosis is present. 7. Aortic dilatation noted. There is borderline dilatation of the ascending aorta, measuring 38 mm. 8. The inferior vena cava is normal in size with greater than 50% respiratory variability, suggesting right atrial pressure of 3 mmHg.  Comparison(s): Prior images unable to be directly viewed, comparison made by report only.  Conclusion(s)/Recommendation(s): Technically challenging study--grossly normal LVEF, but reduced sensitivity for regional wall motion abnormalities.  FINDINGS Left Ventricle: Left ventricular ejection fraction, by estimation, is 65 to 70%. The left ventricle has normal function. The left ventricle has no regional wall motion abnormalities. The left ventricular internal cavity size was normal in size. There is moderate concentric left ventricular hypertrophy. Left ventricular diastolic parameters are indeterminate.  Right Ventricle: The right ventricular size is normal. Right vetricular wall thickness was not well visualized. Right ventricular systolic function is normal.  Left  Atrium: Left atrial size was mild to moderately dilated.  Right Atrium: Right atrial size was mildly dilated.  Pericardium: There is no evidence of pericardial effusion.  Mitral Valve: The mitral valve was not well visualized. There is mild calcification of the mitral valve leaflet(s). No evidence of mitral valve regurgitation. No evidence of mitral valve stenosis.  Tricuspid Valve: The tricuspid valve is not well visualized. Tricuspid valve regurgitation is not demonstrated. No evidence of tricuspid stenosis.  Aortic Valve: The aortic valve was not well visualized. There is mild calcification of the aortic valve. Aortic valve regurgitation is not visualized. No aortic stenosis is present.  Pulmonic Valve: The pulmonic valve was not well visualized. Pulmonic valve regurgitation is not visualized.  Aorta: Aortic dilatation noted. There is borderline dilatation of the ascending aorta, measuring 38 mm.  Venous: The inferior vena cava is normal in size with greater than 50% respiratory variability, suggesting right atrial pressure of 3 mmHg.  IAS/Shunts: The interatrial septum was not well visualized.   LEFT VENTRICLE PLAX 2D LVIDd:         3.60 cm   Diastology LVIDs:         2.60 cm   LV e' medial:    6.85 cm/s LV PW:  1.30 cm   LV E/e' medial:  11.2 LV IVS:        1.30 cm   LV e' lateral:   6.31 cm/s LVOT diam:     2.10 cm   LV E/e' lateral: 12.1 LV SV:         92 LV SV Index:   46 LVOT Area:     3.46 cm  3D Volume EF: 3D EF:        65 % LV EDV:       109 ml LV ESV:       38 ml LV SV:        71 ml  RIGHT VENTRICLE RV Basal diam:  4.80 cm RV Mid diam:    3.90 cm RV S prime:     17.60 cm/s  LEFT ATRIUM             Index        RIGHT ATRIUM           Index LA diam:        3.80 cm 1.87 cm/m   RA Area:     20.90 cm LA Vol (A2C):   63.0 ml 31.08 ml/m  RA Volume:   56.70 ml  27.97 ml/m LA Vol (A4C):   69.8 ml 34.44 ml/m LA Biplane Vol: 68.0 ml 33.55 ml/m AORTIC  VALVE LVOT Vmax:   102.00 cm/s LVOT Vmean:  68.100 cm/s LVOT VTI:    0.267 m  AORTA Ao Root diam: 3.10 cm Ao Asc diam:  3.80 cm  MITRAL VALVE MV Area (PHT): 2.76 cm    SHUNTS MV Decel Time: 275 msec    Systemic VTI:  0.27 m MV E velocity: 76.50 cm/s  Systemic Diam: 2.10 cm MV A velocity: 91.20 cm/s MV E/A ratio:  0.84  Jodelle Red MD Electronically signed by Jodelle Red MD Signature Date/Time: 03/20/2022/9:42:16 AM    Final    MONITORS  LONG TERM MONITOR (3-14 DAYS) 01/03/2022  Narrative   Basic rhythm is NSR with average HR 77 bpm   104 SVT runs with longest 1.5 minutes   Atrial fibrillation burden < 1% with longest lasting 13.5 minutes at average HR 148 bpm   One symptomatic episode correlated with AF   Patch Wear Time:  14 days and 0 hours (2023-09-29T10:13:25-0400 to 2023-10-13T10:13:29-0400)  Patient had a min HR of 49 bpm, max HR of 197 bpm, and avg HR of 77 bpm. Predominant underlying rhythm was Sinus Rhythm. 104 Supraventricular Tachycardia runs occurred, the run with the fastest interval lasting 11.7 secs with a max rate of 197 bpm, the longest lasting 1 min 22 secs with an avg rate of 145 bpm. Atrial Fibrillation/Flutter occurred (<1% burden), ranging from 86-186 bpm (avg of 138 bpm), the longest lasting 13 mins 31 secs with an avg rate of 148 bpm. Atrial Fibrillation/Flutter was detected within +/- 45 seconds of symptomatic patient event(s). Isolated SVEs were rare (<1.0%), SVE Couplets were rare (<1.0%), and SVE Triplets were rare (<1.0%). Isolated VEs were rare (<1.0%, 2298), VE Couplets were rare (<1.0%, 42), and VE Triplets were rare (<1.0%, 2). Ventricular Bigeminy and Trigeminy were present.           Risk Assessment/Calculations:    CHA2DS2-VASc Score = 6   This indicates a 9.7% annual risk of stroke. The patient's score is based upon: CHF History: 0 HTN History: 1 Diabetes History: 1 Stroke History: 0 Vascular Disease  History: 1 Age Score: 2 Gender  Score: 1           Lab Results  Component Value Date   WBC 7.8 09/22/2021   HGB 14.0 09/22/2021   HCT 42.2 09/22/2021   MCV 86 09/22/2021   PLT 288 09/22/2021   Lab Results  Component Value Date   CREATININE 1.18 (H) 05/10/2022   BUN 19 05/10/2022   NA 139 05/10/2022   K 4.2 05/10/2022   CL 103 05/10/2022   CO2 21 05/10/2022   Lab Results  Component Value Date   ALT 12 09/22/2021   AST 11 09/22/2021   GGT 71 (H) 04/12/2019   ALKPHOS 87 09/22/2021   BILITOT 0.6 09/22/2021   Lab Results  Component Value Date   CHOL 195 05/04/2022   HDL 49 05/04/2022   LDLCALC 121 (H) 05/04/2022   LDLDIRECT 83 06/11/2019   TRIG 140 05/04/2022   CHOLHDL 4.0 05/04/2022    Lab Results  Component Value Date   HGBA1C 7.1 (H) 05/04/2022     Assessment & Plan    1.  History of CAD: -s/p NSTEMI 2015 treated with DES to LAD with nonobstructive CAD in RCA and circumflex.  Patient had last ischemic evaluation by Steffanie Dunn in 2023 that was low risk and normal. -Today she reports bouts of chest pain that occur with emotional upset that are relieved with nitroglycerin. -She denies any recurrence with normal activity. -Start Imdur 15 mg daily with 15 mg as needed for breakthrough chest pain -Continue GDMT with metoprolol 100 mg daily, Plavix 75 mg daily, Repatha 140 mg q. 14 days  2.  Essential hypertension: -Patient's blood pressure today was controlled at 130/76 -Patient continues to have lower extremity edema and we will discontinue amlodipine 5 mg today. -Continue chlorthalidone 25 mg daily, Toprol XL 100 mg daily, Benicar 40 mg daily, and Aldactone 25 mg daily -Continue to monitor your sodium intake and follow DASH diet  3.  Paroxysmal AF: -Continue rate control with Toprol-XL 100 mg daily -Patient's last hemoglobin was 14 and creatinine was 1.1 -Continue Eliquis 5 mg twice daily -CHA2DS2-VASc Score = 6 [CHF History: 0, HTN History: 1,  Diabetes History: 1, Stroke History: 0, Vascular Disease History: 1, Age Score: 2, Gender Score: 1].  Therefore, the patient's annual risk of stroke is 9.7 %.      4.  Lower extremity edema: -Patient recently had complaint of increased lower extremity edema after metoprolol was switched to carvedilol.  During previous visit carvedilol was switched back to metoprolol and patient reports slight improvement in swelling. -We will discontinue amlodipine as noted above and patient was advised to wear compression stockings and elevate extremities when dependent.  5.  Obstructive sleep apnea: -Sleep study completed and patient currently on CPAP with AutoPap  6. Bruit: -Carotid Doppler due to auscultated bruit on physical exam -History of tobacco abuse will also obtain noncontrast CT of the chest  Disposition: Follow-up with None or APP in 3 months   Medication Adjustments/Labs and Tests Ordered: Current medicines are reviewed at length with the patient today.  Concerns regarding medicines are outlined above.   Signed, Napoleon Form, Leodis Rains, NP 07/26/2022, 3:57 PM [facility]

## 2022-07-26 ENCOUNTER — Ambulatory Visit: Payer: Medicare PPO | Attending: Nurse Practitioner | Admitting: Nurse Practitioner

## 2022-07-26 ENCOUNTER — Encounter: Payer: Self-pay | Admitting: Nurse Practitioner

## 2022-07-26 VITALS — BP 130/76 | HR 65 | Ht 65.0 in | Wt 212.4 lb

## 2022-07-26 DIAGNOSIS — I2510 Atherosclerotic heart disease of native coronary artery without angina pectoris: Secondary | ICD-10-CM | POA: Diagnosis not present

## 2022-07-26 DIAGNOSIS — I10 Essential (primary) hypertension: Secondary | ICD-10-CM

## 2022-07-26 DIAGNOSIS — I480 Paroxysmal atrial fibrillation: Secondary | ICD-10-CM | POA: Diagnosis not present

## 2022-07-26 DIAGNOSIS — G4733 Obstructive sleep apnea (adult) (pediatric): Secondary | ICD-10-CM | POA: Diagnosis not present

## 2022-07-26 DIAGNOSIS — R0989 Other specified symptoms and signs involving the circulatory and respiratory systems: Secondary | ICD-10-CM

## 2022-07-26 DIAGNOSIS — R600 Localized edema: Secondary | ICD-10-CM

## 2022-07-26 MED ORDER — ISOSORBIDE MONONITRATE ER 30 MG PO TB24: 15.0000 mg | ORAL_TABLET | Freq: Every day | ORAL | 3 refills | Status: DC

## 2022-07-26 NOTE — Patient Instructions (Signed)
Medication Instructions:  STOP NORVASC START Imdur 15mg  Tablet come in 30mg  ONLY; break 30mg  tablet in half and take half tablet daily and can take an additional half tab as needed for chest pain or you can take a whole tablet daily and then decrease to half tab if no chest pain or needed *If you need a refill on your cardiac medications before your next appointment, please call your pharmacy*   Lab Work: None ordered   Testing/Procedures: CHEST CT W/O CONTRAST  Your physician has requested that you have a carotid duplex. This test is an ultrasound of the carotid arteries in your neck. It looks at blood flow through these arteries that supply the brain with blood. Allow one hour for this exam. There are no restrictions or special instructions.   Follow-Up: At [facility], you and your health needs are our priority.  As part of our continuing mission to provide you with exceptional heart care, we have created designated Provider Care Teams.  These Care Teams include your primary Cardiologist (physician) and Advanced Practice Providers (APPs -  Physician Assistants and Nurse Practitioners) who all work together to provide you with the care you need, when you need it.  We recommend signing up for the patient portal called "MyChart".  Sign up information is provided on this After Visit Summary.  MyChart is used to connect with patients for Virtual Visits (Telemedicine).  Patients are able to view lab/test results, encounter notes, upcoming appointments, etc.  Non-urgent messages can be sent to your provider as well.   To learn more about what you can do with MyChart, go to [email or web address].    Your next appointment:   3 month(s)  Provider:   Robin Searing, NP       Other Instructions Check your blood pressure daily for 1 week, then contact the office with your readings.

## 2022-08-05 ENCOUNTER — Encounter: Payer: Self-pay | Admitting: Internal Medicine

## 2022-08-05 ENCOUNTER — Ambulatory Visit: Payer: Medicare PPO | Admitting: Internal Medicine

## 2022-08-05 VITALS — BP 124/72 | HR 70 | Temp 98.9°F | Ht 65.0 in | Wt 212.6 lb

## 2022-08-05 DIAGNOSIS — I739 Peripheral vascular disease, unspecified: Secondary | ICD-10-CM

## 2022-08-05 DIAGNOSIS — M79642 Pain in left hand: Secondary | ICD-10-CM | POA: Diagnosis not present

## 2022-08-05 DIAGNOSIS — I1310 Hypertensive heart and chronic kidney disease without heart failure, with stage 1 through stage 4 chronic kidney disease, or unspecified chronic kidney disease: Secondary | ICD-10-CM

## 2022-08-05 DIAGNOSIS — E1122 Type 2 diabetes mellitus with diabetic chronic kidney disease: Secondary | ICD-10-CM | POA: Diagnosis not present

## 2022-08-05 DIAGNOSIS — Z6835 Body mass index (BMI) 35.0-35.9, adult: Secondary | ICD-10-CM

## 2022-08-05 DIAGNOSIS — M79641 Pain in right hand: Secondary | ICD-10-CM | POA: Diagnosis not present

## 2022-08-05 DIAGNOSIS — G4733 Obstructive sleep apnea (adult) (pediatric): Secondary | ICD-10-CM

## 2022-08-05 DIAGNOSIS — G720 Drug-induced myopathy: Secondary | ICD-10-CM | POA: Diagnosis not present

## 2022-08-05 DIAGNOSIS — Z794 Long term (current) use of insulin: Secondary | ICD-10-CM

## 2022-08-05 DIAGNOSIS — N1831 Chronic kidney disease, stage 3a: Secondary | ICD-10-CM | POA: Diagnosis not present

## 2022-08-05 DIAGNOSIS — T466X5A Adverse effect of antihyperlipidemic and antiarteriosclerotic drugs, initial encounter: Secondary | ICD-10-CM

## 2022-08-05 NOTE — Progress Notes (Signed)
I,Victoria T Hamilton,acting as a scribe for [name], MD.,have documented all relevant documentation on the behalf of [name], MD,as directed by  [name], MD while in the presence of [name], MD.    Subjective:     Patient ID: Dawn Wood , female    DOB: January 19, 1946 , 77 y.o.   MRN: [id number]   Chief Complaint  Patient presents with   Diabetes   Hypertension    HPI  Patient presents today for a HTN/DM check. She reports compliance with meds. She denies headaches, chest pain and shortness of breath.    Patient reports recently seeing cardiologist Robin Searing. She was taken off of Amlodipine. & prescribed isosorbide mononitrate. She admits not taking medication yet. She wanted to make sure it was "ok" for her to take.   Diabetes She presents for her follow-up diabetic visit. She has type 2 diabetes mellitus. Her disease course has been stable. There are no hypoglycemic associated symptoms. Pertinent negatives for diabetes include no blurred vision, no polydipsia, no polyphagia and no polyuria. There are no hypoglycemic complications. Diabetic complications include nephropathy. Risk factors for coronary artery disease include diabetes mellitus, dyslipidemia, hypertension, obesity, sedentary lifestyle and post-menopausal. Current diabetic treatment includes insulin injections. She is following a diabetic diet. She participates in exercise intermittently. Her home blood glucose trend is fluctuating minimally. Her breakfast blood glucose is taken between 8-9 am. Her breakfast blood glucose range is generally 110-130 mg/dl. Eye exam is current.  Hypertension This is a chronic problem. The current episode started more than 1 year ago. The problem has been gradually improving since onset. Pertinent negatives include no blurred vision. Risk factors for coronary artery disease include diabetes mellitus, dyslipidemia, sedentary lifestyle and post-menopausal  state. Hypertensive end-organ damage includes kidney disease.     Past Medical History:  Diagnosis Date   Coronary artery disease    Diabetes mellitus without complication (HCC)    GERD (gastroesophageal reflux disease)    Hypertension    MI (myocardial infarction) (HCC)    Peripheral vascular disease (HCC)      Family History  Problem Relation Age of Onset   Hypertension Mother    Cancer Father    Hypertension Father    Breast cancer Sister 29   Aortic stenosis Daughter    Heart disease Daughter        before age 88   Thyroid disease Daughter    Sleep apnea Neg Hx      Current Outpatient Medications:    Accu-Chek Softclix Lancets lancets, USE TO CHECK BLOOD SUGARS ONCE DAILY., Disp: 100 each, Rfl: 12   apixaban (ELIQUIS) 5 MG TABS tablet, Take 1 tablet (5 mg total) by mouth 2 (two) times daily., Disp: 180 tablet, Rfl: 1   Blood Glucose Monitoring Suppl (ACCU-CHEK GUIDE) w/Device KIT, USE TO CHECK BLOOD SUGARS ONCE DAILY., Disp: 1 kit, Rfl: 0   chlorthalidone (HYGROTON) 25 MG tablet, Take 1 tablet (25 mg total) by mouth daily., Disp: 90 tablet, Rfl: 1   famotidine (PEPCID) 20 MG tablet, TAKE 1 TABLET(20 MG) BY MOUTH TWICE DAILY, Disp: 180 tablet, Rfl: 1   fluticasone (FLONASE) 50 MCG/ACT nasal spray, SHAKE LIQUID AND USE 1 SPRAY IN EACH NOSTRIL DAILY AS NEEDED FOR ALLERGIES OR RHINITIS, Disp: 16 g, Rfl: 2   glucose blood (ACCU-CHEK GUIDE) test strip, USE TO CHECK BLOOD SUGARS ONCE DAILY., Disp: 100 each, Rfl: 12   insulin aspart protamine - aspart (NOVOLOG MIX 70/30  FLEXPEN) (70-30) 100 UNIT/ML FlexPen, INJECT 40 UNITS SUBCUTANEOUS EVERY MORNING AND 50 UNITS SUBCUTANEOUS AT DINNER, Disp: 75 mL, Rfl: 2   isosorbide mononitrate (IMDUR) 30 MG 24 hr tablet, Take 0.5-1 tablets (15-30 mg total) by mouth daily., Disp: 30 tablet, Rfl: 3   metoprolol succinate (TOPROL-XL) 100 MG 24 hr tablet, Take 1 tablet (100 mg total) by mouth daily. Take with or immediately following a meal., Disp: 90  tablet, Rfl: 1   nitroGLYCERIN (NITROSTAT) 0.4 MG SL tablet, Place 1 tablet (0.4 mg total) under the tongue every 5 (five) minutes as needed for chest pain., Disp: 25 tablet, Rfl: 3   olmesartan (BENICAR) 40 MG tablet, TAKE 1 TABLET(40 MG) BY MOUTH DAILY, Disp: 90 tablet, Rfl: 2   ONETOUCH VERIO test strip, USE TO CHECK BLOOD SUGARS TWICE DAILY AS DIRECTED, Disp: 100 strip, Rfl: 3   Semaglutide,0.25 or 0.5MG /DOS, (OZEMPIC, 0.25 OR 0.5 MG/DOSE,) 2 MG/1.5ML SOPN, INJECT 0.5MG  INTO SKIN ONCE A WEEK, Disp: 4.5 mL, Rfl: 3   spironolactone (ALDACTONE) 25 MG tablet, Take 1 tablet (25 mg total) by mouth daily., Disp: 90 tablet, Rfl: 1   traMADol (ULTRAM) 50 MG tablet, Take 50 mg by mouth daily at 6 (six) AM., Disp: , Rfl:    VYZULTA 0.024 % SOLN, Apply 1 drop to eye in the morning, at noon, and at bedtime., Disp: , Rfl:    BD PEN NEEDLE NANO 2ND GEN 32G X 4 MM MISC, USE AS DIRECTED TWICE DAILY, Disp: 100 each, Rfl: 2   Cholecalciferol (VITAMIN D3) 2000 units TABS, Take 1 tablet by mouth daily. (Patient not taking: Reported on 08/05/2022), Disp: , Rfl:    Coenzyme Q10 (CO Q 10) 10 MG CAPS, Take by mouth daily. (Patient not taking: Reported on 08/05/2022), Disp: , Rfl:    colchicine 0.6 MG tablet, Take 1 tablet (0.6 mg total) by mouth 2 (two) times daily., Disp: 180 tablet, Rfl: 3   Evolocumab (REPATHA SURECLICK) 140 MG/ML SOAJ, Inject 140 mg into the skin every 14 (fourteen) days., Disp: 6 mL, Rfl: 1   gabapentin (NEURONTIN) 100 MG capsule, One tab po qhs x 1 week, then 2 tabs po qhs (Patient not taking: Reported on 08/05/2022), Disp: 180 capsule, Rfl: 0   ketorolac (ACULAR) 0.5 % ophthalmic solution, Place 1 drop into the right eye 3 (three) times daily., Disp: , Rfl:    lidocaine-prilocaine (EMLA) cream, , Disp: , Rfl:   Current Facility-Administered Medications:    nitroGLYCERIN (NITROSTAT) SL tablet 0.4 mg, 0.4 mg, Sublingual, Q5 min PRN, Conte, Tessa N, PA-C   Allergies  Allergen Reactions   Dust Mite  Extract Other (See Comments)    REACTION: SNEEZING   Other Other (See Comments)    ALLERGEN: DEODORIZERS SPRAY AND PERFUMES REACTION: SNEEZING   Statins Other (See Comments)    Muscle aches with rosuvastatin 5 mg daily and 3x/weekly, atorvastatin 20 mg daily and 3x/week, simvastatin 40 mg daily    Allopurinol     Diarrhea and the pt said it made her sugars low   Pollen Extract Other (See Comments)    Reaction unknown     Review of Systems  Constitutional: Negative.   Eyes:  Negative for blurred vision.  Respiratory: Negative.    Cardiovascular: Negative.   Endocrine: Negative for polydipsia, polyphagia and polyuria.  Musculoskeletal:  Positive for arthralgias.       She c/o b/l hand pain. Denies recent fall.  Awakens with hand stiffness. Sx persist throughout the day.  Skin: Negative.   Neurological: Negative.   Psychiatric/Behavioral: Negative.       Today's Vitals   08/05/22 1446 08/05/22 1504  BP: (!) 140/78 124/72  Pulse: 70   Temp: 98.9 F (37.2 C)   SpO2: 98%   Weight: 212 lb 9.6 oz (96.4 kg)   Height: 5\' 5"  (1.651 m)    Wt Readings from Last 3 Encounters:  08/05/22 212 lb 9.6 oz (96.4 kg)  07/26/22 212 lb 6.4 oz (96.3 kg)  06/28/22 210 lb 6.4 oz (95.4 kg)    Body mass index is 35.38 kg/m.  The 10-year ASCVD risk score (Arnett DK, et al., 2019) is: 53.9%   Values used to calculate the score:     Age: 89 years     Sex: Female     Is Non-Hispanic African American: Yes     Diabetic: Yes     Tobacco smoker: Yes     Systolic Blood Pressure: 124 mmHg     Is BP treated: Yes     HDL Cholesterol: 49 mg/dL     Total Cholesterol: 195 mg/dL  Objective:  Physical Exam Vitals and nursing note reviewed.  Constitutional:      Appearance: Normal appearance.  HENT:     Head: Normocephalic and atraumatic.  Cardiovascular:     Rate and Rhythm: Normal rate and regular rhythm.     Heart sounds: Normal heart sounds.  Pulmonary:     Effort: Pulmonary effort is  normal.     Breath sounds: Normal breath sounds.  Musculoskeletal:     Cervical back: Normal range of motion.     Comments: MCP swelling  Skin:    General: Skin is warm.  Neurological:     General: No focal deficit present.     Mental Status: She is alert.  Psychiatric:        Mood and Affect: Mood normal.        Behavior: Behavior normal.         Assessment And Plan:     1. Type 2 diabetes mellitus with stage 3a chronic kidney disease, with long-term current use of insulin (HCC) Comments: Chronic, I will check labs as below. Reminded to avoid NSAIDs, stay well hydrated and keep BP well controlled. She will f/u in 3-4 months. - CBC - CMP14+EGFR - Hemoglobin A1c  2. Hypertensive heart and renal disease with renal failure, stage 1 through stage 4 or unspecified chronic kidney disease, without heart failure Comments: Chronic, also managed by Cardiology. No longer on amlodipine due to LE swelling.  She is reminded to follow a low sodium diet. - CMP14+EGFR  3. PVD (peripheral vascular disease) (HCC) Comments: Chronic, importance of regular exercise and compliance w/ Repatha was stressed to the patient.  4. Pain in both hands Comments: Chronic, I will check labs as below. If POS, she agrees to Rheumatology evaluation. Advised to follow low sodium diet, Tylenol prn. - ANA, IFA (with reflex) - CYCLIC CITRUL PEPTIDE ANTIBODY, IGG/IGA - Rheumatoid factor - Sedimentation rate - Uric acid  5. OSA (obstructive sleep apnea) Comments: Unfortunately, she hasn't had much success w/ CPAP due to mask. Risks of untreated OSA were discussed with pt in detail.  6. Class 2 severe obesity due to excess calories with serious comorbidity and body mass index (BMI) of 35.0 to 35.9 in adult [facility]) Comments: She is encouraged to aim for at least 150 minutes of exercise/week to help her achieve lower BMI to decrease cardiac risk.  7.  Statin myopathy Comments: She is intolerant of statins, now on  Repatha.   Return for schedule dm check on same day as AWV on 8/15.  Patient was given opportunity to ask questions. Patient verbalized understanding of the plan and was able to repeat key elements of the plan. All questions were answered to their satisfaction.   I, [name], MD, have reviewed all documentation for this visit. The documentation on 08/05/22 for the exam, diagnosis, procedures, and orders are all accurate and complete.   IF YOU HAVE BEEN REFERRED TO A SPECIALIST, IT MAY TAKE 1-2 WEEKS TO SCHEDULE/PROCESS THE REFERRAL. IF YOU HAVE NOT HEARD FROM US/SPECIALIST IN TWO WEEKS, PLEASE GIVE [place] A CALL AT [phone].   THE PATIENT IS ENCOURAGED TO PRACTICE SOCIAL DISTANCING DUE TO THE COVID-19 PANDEMIC.

## 2022-08-05 NOTE — Patient Instructions (Signed)

## 2022-08-06 ENCOUNTER — Telehealth: Payer: Self-pay | Admitting: Cardiology

## 2022-08-06 NOTE — Telephone Encounter (Signed)
Returned Pts phone call regarding sent in requested BP readings since 07/26/22 visit with Robin Searing, NP. Informed Pt of Robin Searing, NP comments and recommendations. Pt stated understanding.  See message from Robin Searing, NP below:   Please let patient know that her blood pressure readings are excellent and show good control of her blood pressure.  Please continue current medications as prescribed.  Please let me know if you have any additional questions.  Robin Searing, NP

## 2022-08-06 NOTE — Telephone Encounter (Signed)
Pt c/o BP issue: STAT if pt c/o blurred vision, one-sided weakness or slurred speech  1. What are your last 5 BP readings?  5/22: 128/64 72 5/23: 130/64 73 5/24: 124/83 73 5/25: 130/61 71 5/26: 125/59 65 5/27: 124/70 62 5/28: 132/60 65  2. Are you having any other symptoms (ex. Dizziness, headache, blurred vision, passed out)?  No   3. What is your BP issue?   Patient called to provide BP readings.

## 2022-08-09 LAB — CMP14+EGFR
ALT: 16 IU/L (ref 0–32)
AST: 13 IU/L (ref 0–40)
Albumin/Globulin Ratio: 1.5 (ref 1.2–2.2)
Albumin: 4 g/dL (ref 3.8–4.8)
Alkaline Phosphatase: 84 IU/L (ref 44–121)
BUN/Creatinine Ratio: 17 (ref 12–28)
BUN: 24 mg/dL (ref 8–27)
Bilirubin Total: 0.4 mg/dL (ref 0.0–1.2)
CO2: 26 mmol/L (ref 20–29)
Calcium: 9.3 mg/dL (ref 8.7–10.3)
Chloride: 103 mmol/L (ref 96–106)
Creatinine, Ser: 1.43 mg/dL — ABNORMAL HIGH (ref 0.57–1.00)
Globulin, Total: 2.6 g/dL (ref 1.5–4.5)
Glucose: 110 mg/dL — ABNORMAL HIGH (ref 70–99)
Potassium: 4.2 mmol/L (ref 3.5–5.2)
Sodium: 141 mmol/L (ref 134–144)
Total Protein: 6.6 g/dL (ref 6.0–8.5)
eGFR: 38 mL/min/{1.73_m2} — ABNORMAL LOW (ref >59)

## 2022-08-09 LAB — FANA STAINING PATTERNS: Nuclear Dot Pattern: 1:1280 {titer} — ABNORMAL HIGH

## 2022-08-09 LAB — RHEUMATOID FACTOR: Rheumatoid fact SerPl-aCnc: <10 IU/mL (ref <14.0)

## 2022-08-09 LAB — CBC
Hematocrit: 38.9 % (ref 34.0–46.6)
Hemoglobin: 12.9 g/dL (ref 11.1–15.9)
MCH: 28.6 pg (ref 26.6–33.0)
MCHC: 33.2 g/dL (ref 31.5–35.7)
MCV: 86 fL (ref 79–97)
Platelets: 274 10*3/uL (ref 150–450)
RBC: 4.51 x10E6/uL (ref 3.77–5.28)
RDW: 13 % (ref 11.7–15.4)
WBC: 9.5 10*3/uL (ref 3.4–10.8)

## 2022-08-09 LAB — HEMOGLOBIN A1C
Est. average glucose Bld gHb Est-mCnc: 148 mg/dL
Hgb A1c MFr Bld: 6.8 % — ABNORMAL HIGH (ref 4.8–5.6)

## 2022-08-09 LAB — URIC ACID: Uric Acid: 9.4 mg/dL — ABNORMAL HIGH (ref 3.1–7.9)

## 2022-08-09 LAB — SEDIMENTATION RATE: Sed Rate: 48 mm/hr — ABNORMAL HIGH (ref 0–40)

## 2022-08-09 LAB — CYCLIC CITRUL PEPTIDE ANTIBODY, IGG/IGA: Cyclic Citrullin Peptide Ab: 7 units (ref 0–19)

## 2022-08-09 LAB — ANTINUCLEAR ANTIBODIES, IFA: ANA Titer 1: POSITIVE — AB

## 2022-08-10 ENCOUNTER — Encounter (HOSPITAL_COMMUNITY): Payer: Medicare PPO

## 2022-08-10 ENCOUNTER — Ambulatory Visit (HOSPITAL_COMMUNITY)
Admission: RE | Admit: 2022-08-10 | Discharge: 2022-08-10 | Disposition: A | Discharge status: Home / Self Care | Payer: Medicare PPO | Source: Ambulatory Visit | Attending: Cardiology | Admitting: Cardiology

## 2022-08-10 ENCOUNTER — Other Ambulatory Visit (HOSPITAL_COMMUNITY): Payer: Self-pay

## 2022-08-10 DIAGNOSIS — R600 Localized edema: Secondary | ICD-10-CM

## 2022-08-10 DIAGNOSIS — G4733 Obstructive sleep apnea (adult) (pediatric): Secondary | ICD-10-CM

## 2022-08-10 DIAGNOSIS — I480 Paroxysmal atrial fibrillation: Secondary | ICD-10-CM

## 2022-08-10 DIAGNOSIS — R0989 Other specified symptoms and signs involving the circulatory and respiratory systems: Secondary | ICD-10-CM | POA: Diagnosis not present

## 2022-08-10 DIAGNOSIS — I2510 Atherosclerotic heart disease of native coronary artery without angina pectoris: Secondary | ICD-10-CM

## 2022-08-10 DIAGNOSIS — I10 Essential (primary) hypertension: Secondary | ICD-10-CM | POA: Diagnosis not present

## 2022-08-11 ENCOUNTER — Other Ambulatory Visit: Payer: Self-pay

## 2022-08-11 ENCOUNTER — Telehealth: Payer: Self-pay

## 2022-08-11 ENCOUNTER — Other Ambulatory Visit (HOSPITAL_COMMUNITY): Payer: Self-pay

## 2022-08-11 ENCOUNTER — Telehealth: Payer: Self-pay | Admitting: Physician Assistant

## 2022-08-11 ENCOUNTER — Other Ambulatory Visit: Payer: Self-pay | Admitting: Internal Medicine

## 2022-08-11 DIAGNOSIS — I2510 Atherosclerotic heart disease of native coronary artery without angina pectoris: Secondary | ICD-10-CM

## 2022-08-11 DIAGNOSIS — E785 Hyperlipidemia, unspecified: Secondary | ICD-10-CM

## 2022-08-11 NOTE — Telephone Encounter (Signed)
Please complete PA for Repatha 

## 2022-08-11 NOTE — Telephone Encounter (Signed)
Pharmacy Patient Advocate Encounter   Received notification from [facility] that prior authorization for REPATHA 140MG /ML is required/requested.   PA submitted to [employer] via CoverMyMeds  Key [id number]   Status is pending

## 2022-08-11 NOTE — Telephone Encounter (Signed)
Pt c/o medication issue:  1. Name of Medication:   Evolocumab (REPATHA SURECLICK) 140 MG/ML SOAJ    2. How are you currently taking this medication (dosage and times per day)? Inject 140 mg into the skin every 14 (fourteen) days.   3. Are you having a reaction (difficulty breathing--STAT)? No  4. What is your medication issue? Pharmacy calling to f/u on Prior Auth that was faxed yesterday for medication. Please advise

## 2022-08-12 ENCOUNTER — Telehealth: Payer: Self-pay | Admitting: Neurology

## 2022-08-12 DIAGNOSIS — Z789 Other specified health status: Secondary | ICD-10-CM

## 2022-08-12 DIAGNOSIS — G4733 Obstructive sleep apnea (adult) (pediatric): Secondary | ICD-10-CM

## 2022-08-12 MED ORDER — REPATHA SURECLICK 140 MG/ML ~~LOC~~ SOAJ
1.0000 | SUBCUTANEOUS | 1 refills | Status: DC
Start: 2022-08-12 — End: 2023-07-08

## 2022-08-12 NOTE — Telephone Encounter (Signed)
Unfortunately, there is such little data on the download that I cannot really make any recommendations.  At this point, I recommend that we try to get her in for an in lab titration study.

## 2022-08-12 NOTE — Telephone Encounter (Signed)
Pt called stating she is needing to speak to the provider because the cpap machine is "not working for her". Pt was very rude and irate and would not give me details on what or why the cpap machine was "not working for her." Please advise.

## 2022-08-12 NOTE — Telephone Encounter (Signed)
I spoke to pt .  She has been in touch with DME and changed mask twice.  Still struggling with pressure see DL below.    She feels claustrophobic with FFM.  Went back to her nasal (under nose mask) but still has this SHOOSHING sound.    Feels leak.  Any recommendation for her.

## 2022-08-12 NOTE — Telephone Encounter (Signed)
Pharmacy Patient Advocate Encounter  Prior Authorization for Repatha has been APPROVED by [facility] from 08/11/2022 to 03/08/2023.

## 2022-08-13 ENCOUNTER — Ambulatory Visit (HOSPITAL_COMMUNITY)
Admission: RE | Admit: 2022-08-13 | Discharge: 2022-08-13 | Disposition: A | Discharge status: Home / Self Care | Payer: Medicare PPO | Source: Ambulatory Visit | Attending: Nurse Practitioner | Admitting: Nurse Practitioner

## 2022-08-13 ENCOUNTER — Other Ambulatory Visit: Payer: Self-pay | Admitting: Internal Medicine

## 2022-08-13 DIAGNOSIS — I480 Paroxysmal atrial fibrillation: Secondary | ICD-10-CM | POA: Insufficient documentation

## 2022-08-13 DIAGNOSIS — R600 Localized edema: Secondary | ICD-10-CM | POA: Insufficient documentation

## 2022-08-13 DIAGNOSIS — I10 Essential (primary) hypertension: Secondary | ICD-10-CM | POA: Insufficient documentation

## 2022-08-13 DIAGNOSIS — G4733 Obstructive sleep apnea (adult) (pediatric): Secondary | ICD-10-CM | POA: Insufficient documentation

## 2022-08-13 DIAGNOSIS — I2510 Atherosclerotic heart disease of native coronary artery without angina pectoris: Secondary | ICD-10-CM | POA: Diagnosis not present

## 2022-08-13 DIAGNOSIS — R911 Solitary pulmonary nodule: Secondary | ICD-10-CM | POA: Diagnosis not present

## 2022-08-13 DIAGNOSIS — R768 Other specified abnormal immunological findings in serum: Secondary | ICD-10-CM

## 2022-08-14 IMAGING — MG MM DIGITAL DIAGNOSTIC UNILAT*R* W/ TOMO W/ CAD
8 series · 8 of 24 positions shown · non-contrast
Comparison: Previous exam(s).

CLINICAL DATA: 75-year-old female presenting as a recall from
screening for possible right breast mass.

EXAM:
DIGITAL DIAGNOSTIC UNILATERAL RIGHT MAMMOGRAM WITH TOMOSYNTHESIS AND
CAD; ULTRASOUND RIGHT BREAST LIMITED
TECHNIQUE: Right digital diagnostic mammography and breast tomosynthesis was
performed. The images were evaluated with computer-aided detection.;
Targeted ultrasound examination of the right breast was performed

[R ML synth-2D]
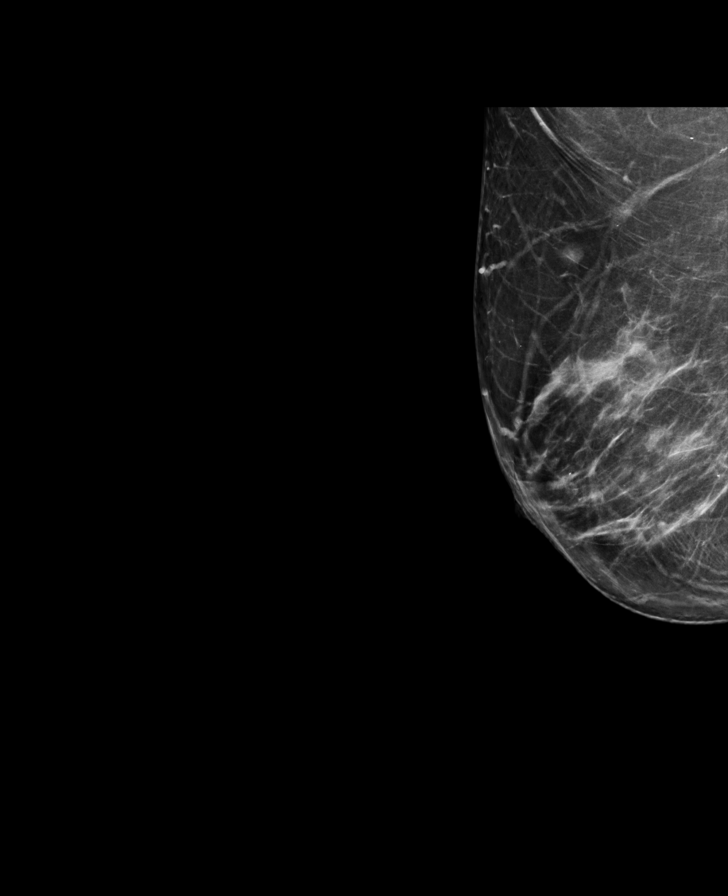

[R CC synth-2D]
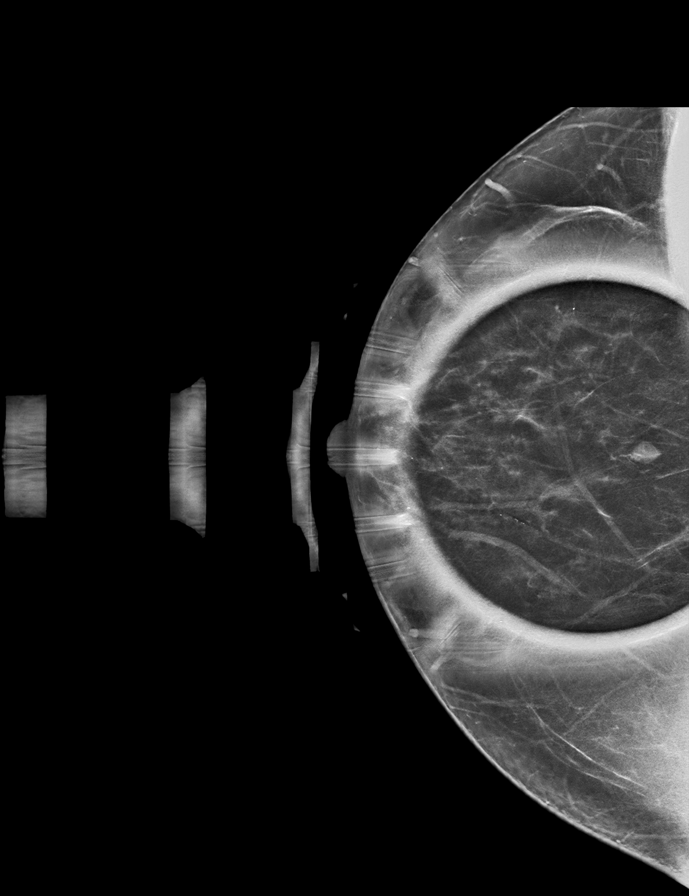

[R MLO synth-2D (1 of 2)]
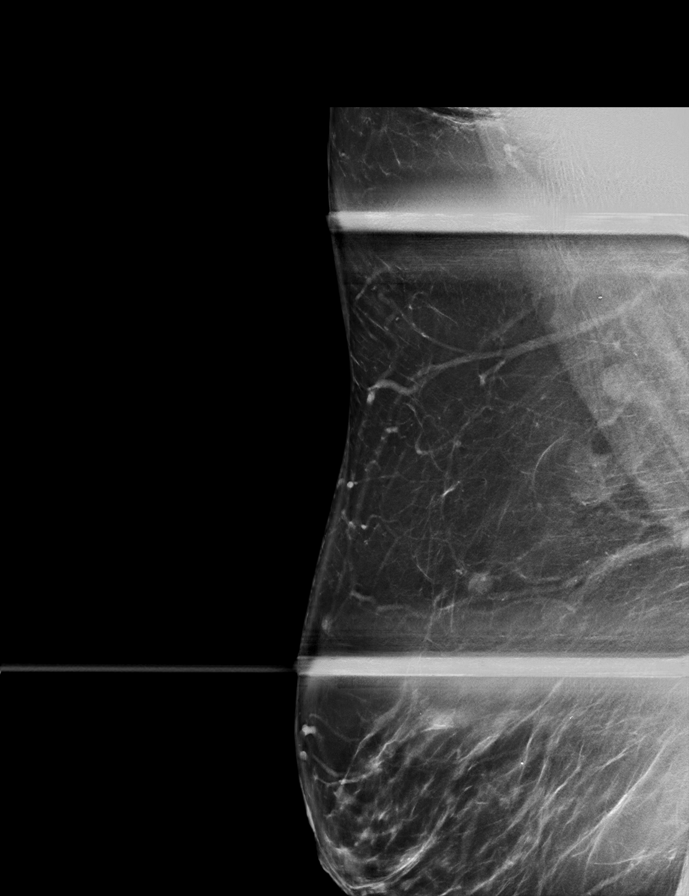

[R MLO synth-2D (2 of 2)]
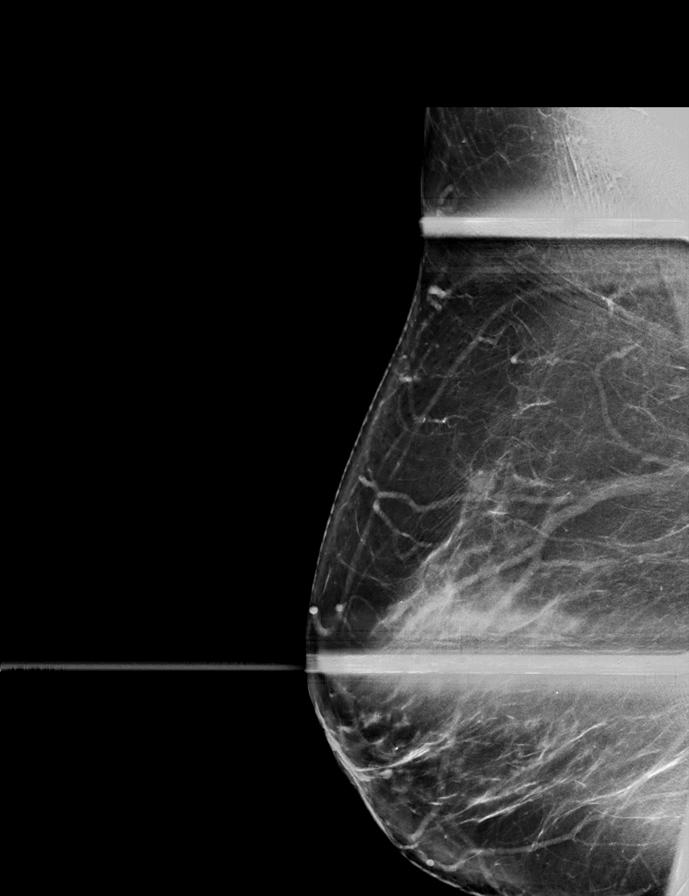

[R MLO tomo (1 of 2) · tomo slice 43/85.0]
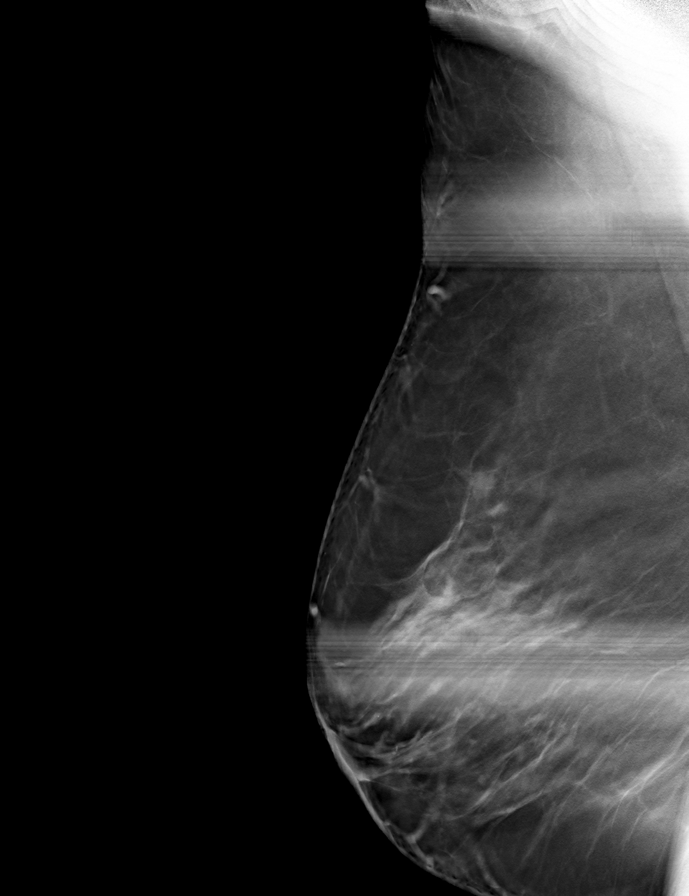

[R ML tomo · tomo slice 39/78.0]
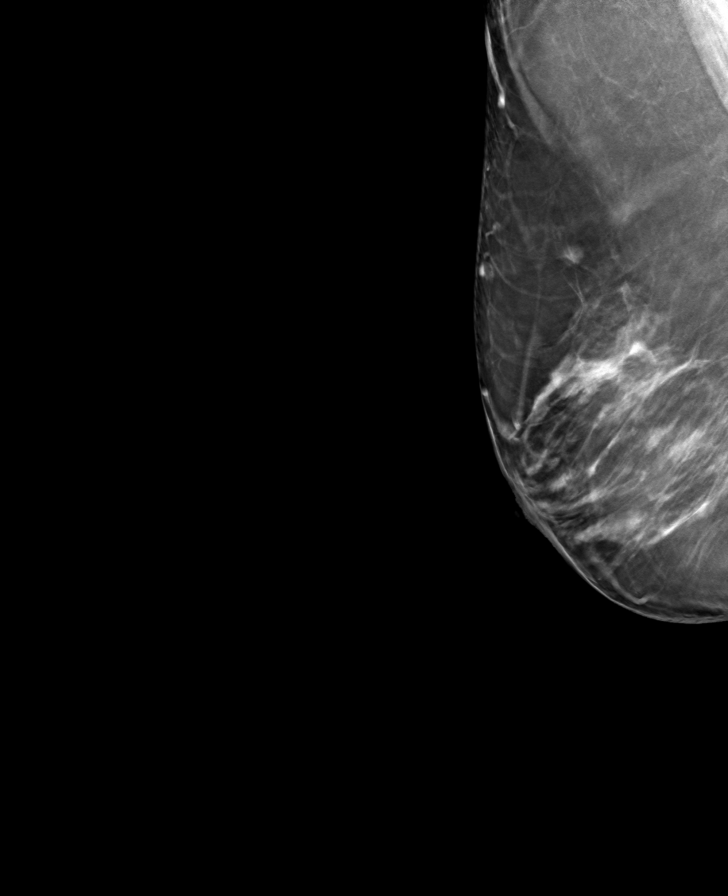

[R CC tomo · tomo slice 31/61.0]
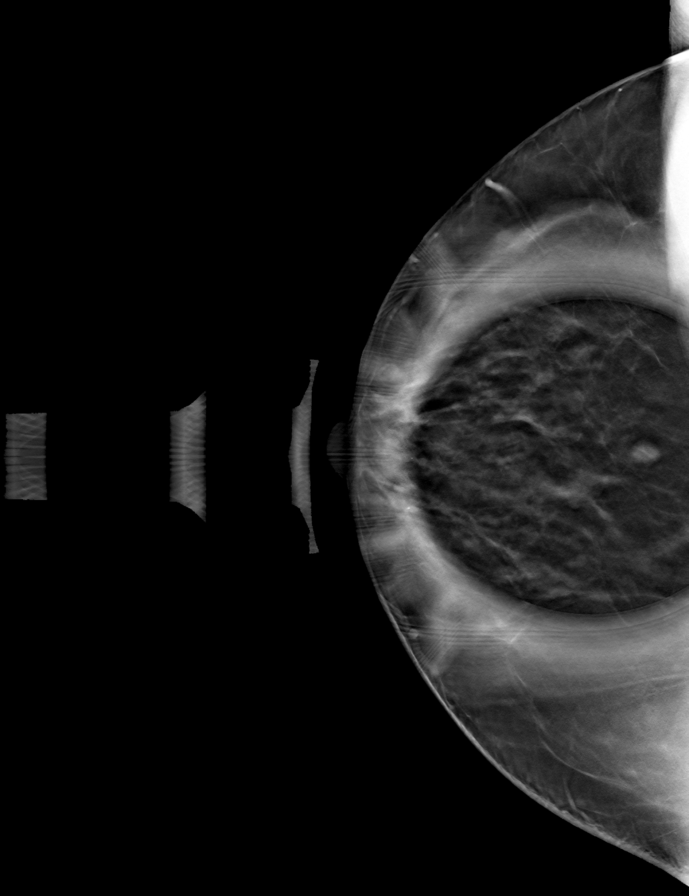

[R MLO tomo (2 of 2) · tomo slice 48/95.0]
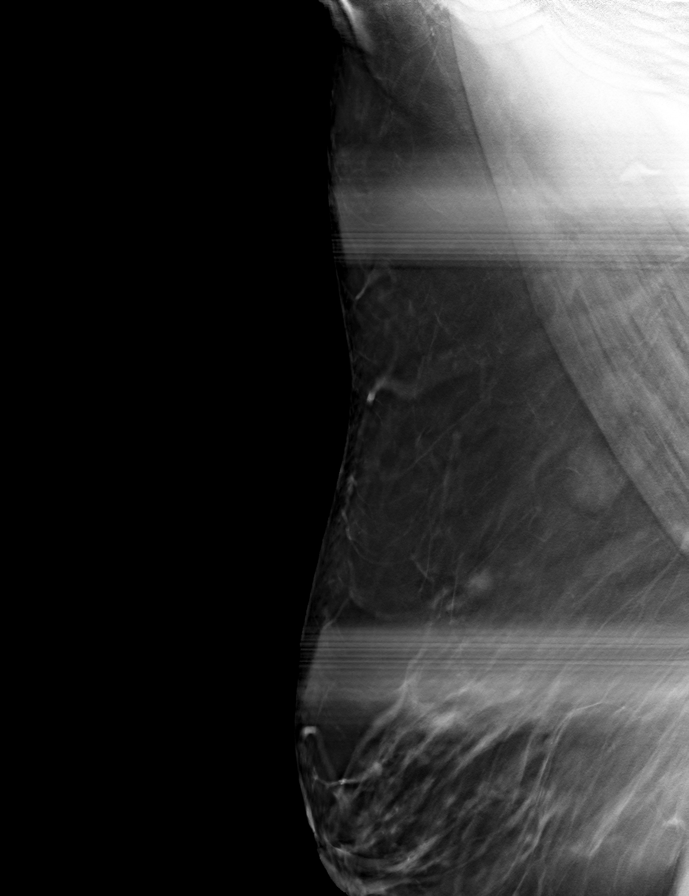

[8 of 24 positions shown; findings below may reference images not displayed]

ACR Breast Density Category b: There are scattered areas of
fibroglandular density.
FINDINGS: Mammogram:

Right breast: Spot compression tomosynthesis views of the right
breast were performed demonstrating persistence of an oval
circumscribed mass in the superior central right breast measuring
approximately 0.6 cm.

Ultrasound:

Targeted ultrasound is performed in the right breast at 12 o'clock 6
cm from the nipple demonstrating an oval circumscribed anechoic mass
measuring 0.5 x 0.5 x 0.6 cm, consistent with a benign simple cyst.
This corresponds to the mammographic finding.
IMPRESSION: Benign simple cyst in the right breast at 12 o'clock measuring
cm.

RECOMMENDATION:
Screening mammogram in one year.(Code:MW-2-8C7)

I have discussed the findings and recommendations with the patient.
If applicable, a reminder letter will be sent to the patient
regarding the next appointment.

BI-RADS CATEGORY  2: Benign.

## 2022-08-14 IMAGING — US US BREAST*R* LIMITED INC AXILLA
1 series · 5 of 5 positions shown · non-contrast
Comparison: Previous exam(s).

CLINICAL DATA: 75-year-old female presenting as a recall from
screening for possible right breast mass.

EXAM:
DIGITAL DIAGNOSTIC UNILATERAL RIGHT MAMMOGRAM WITH TOMOSYNTHESIS AND
CAD; ULTRASOUND RIGHT BREAST LIMITED
TECHNIQUE: Right digital diagnostic mammography and breast tomosynthesis was
performed. The images were evaluated with computer-aided detection.;
Targeted ultrasound examination of the right breast was performed

[Series 1: us breast*right* limited inc axilla · 0.06mm/px · 5 of 5 slices shown]
[im 1/5]
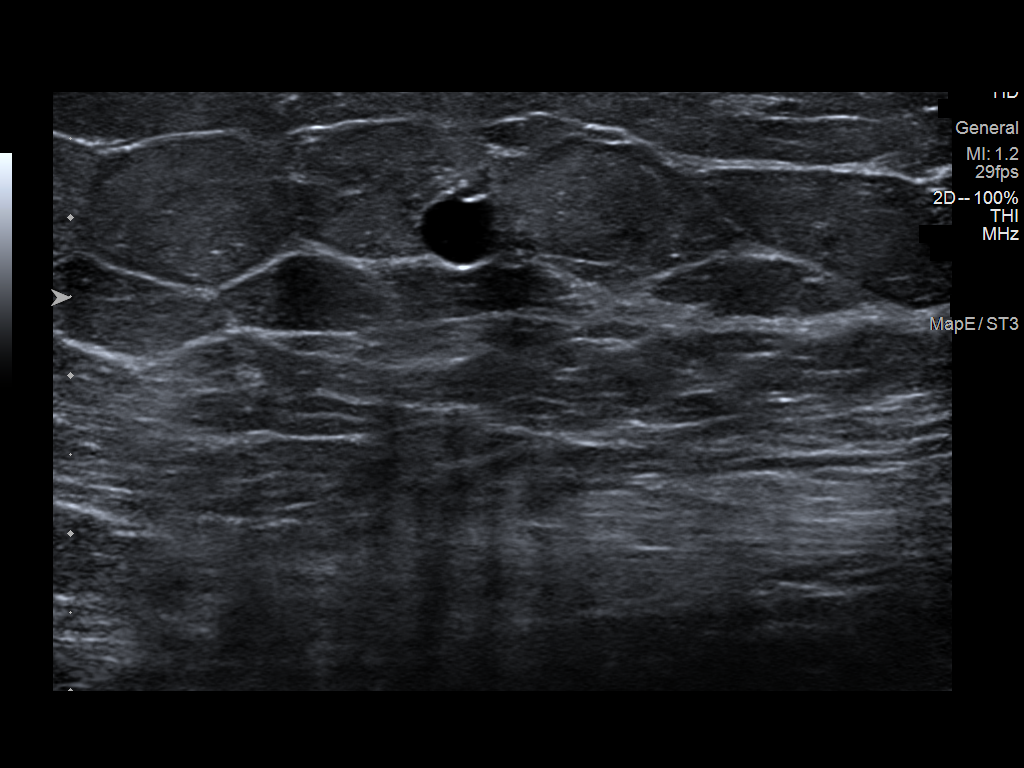
[im 2/5]
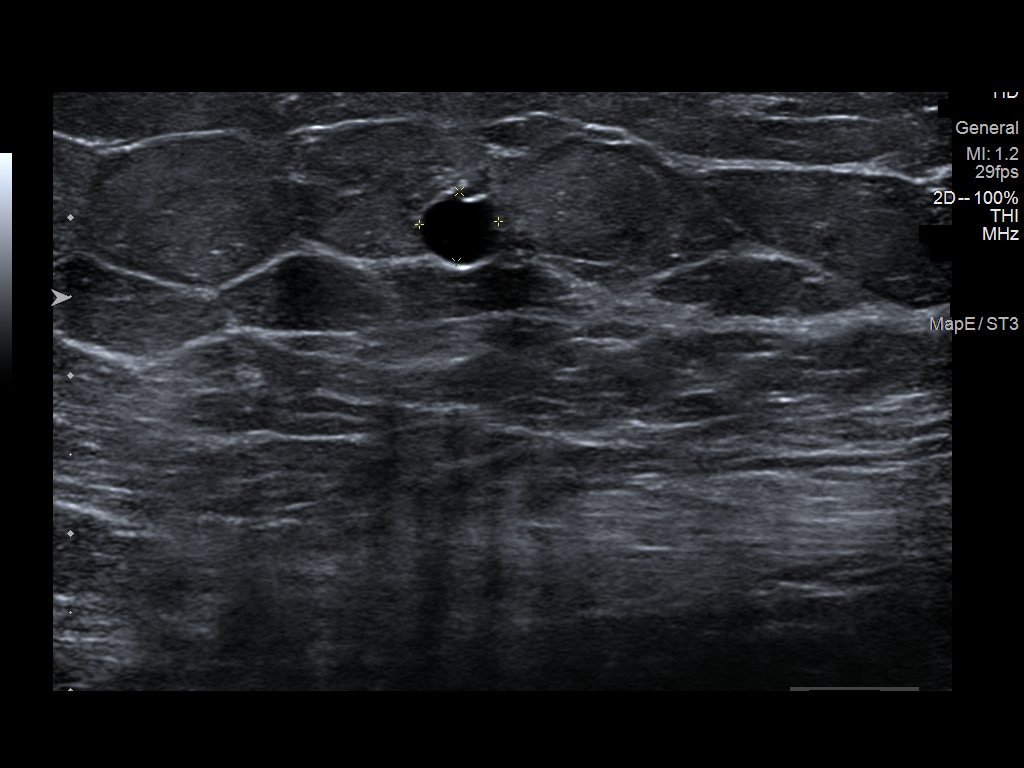
[im 3/5]
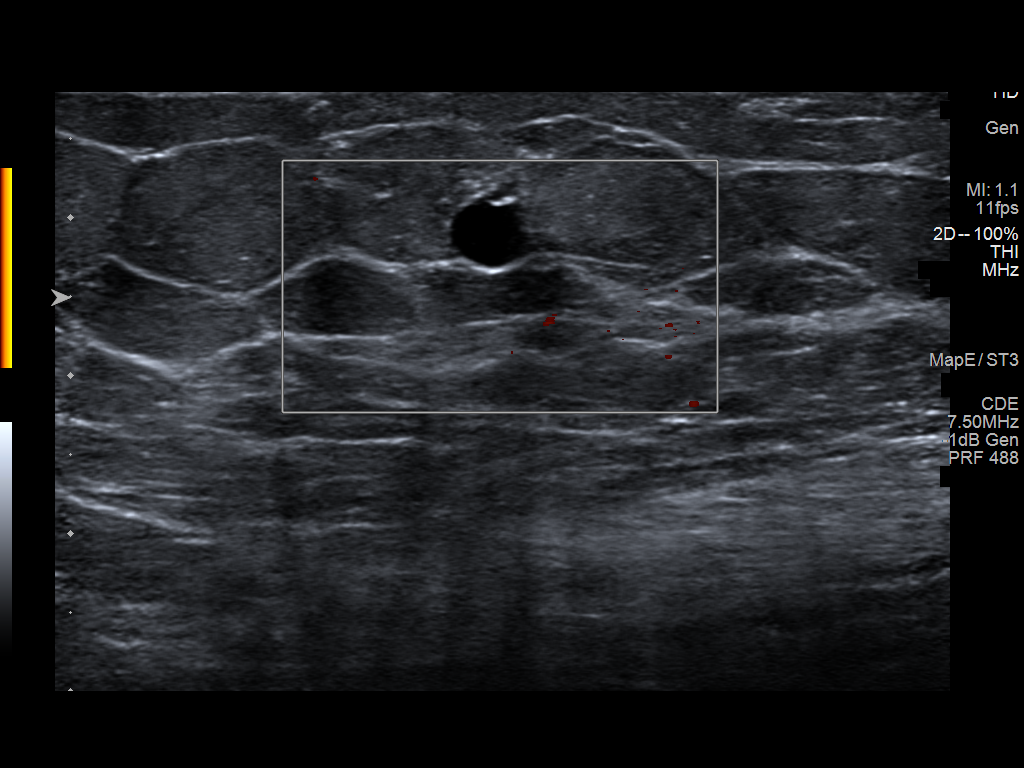
[im 4/5]
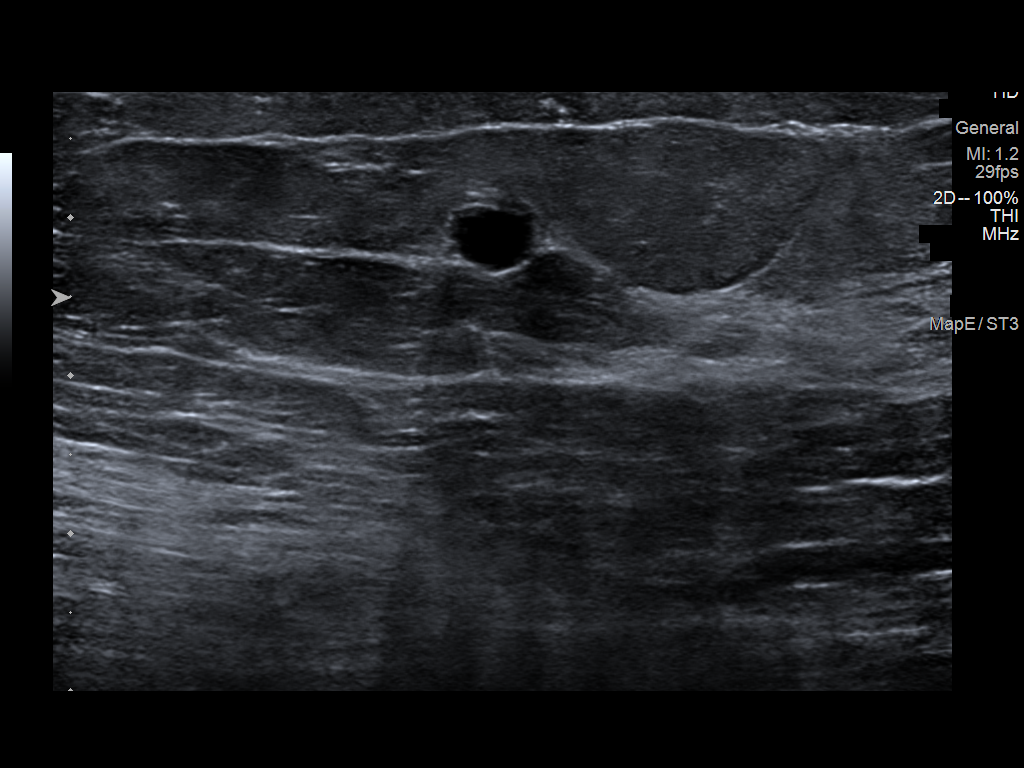
[im 5/5]
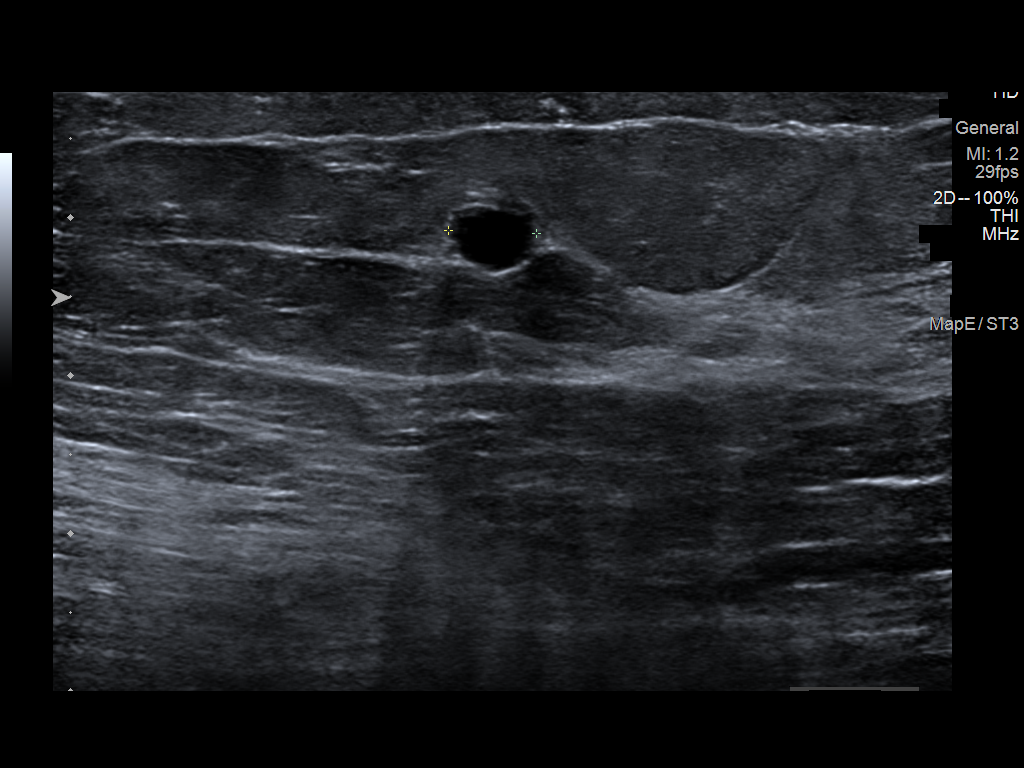

[5 of 5 positions shown; findings below may reference images not displayed]

ACR Breast Density Category b: There are scattered areas of
fibroglandular density.
FINDINGS: Mammogram:

Right breast: Spot compression tomosynthesis views of the right
breast were performed demonstrating persistence of an oval
circumscribed mass in the superior central right breast measuring
approximately 0.6 cm.

Ultrasound:

Targeted ultrasound is performed in the right breast at 12 o'clock 6
cm from the nipple demonstrating an oval circumscribed anechoic mass
measuring 0.5 x 0.5 x 0.6 cm, consistent with a benign simple cyst.
This corresponds to the mammographic finding.
IMPRESSION: Benign simple cyst in the right breast at 12 o'clock measuring
cm.

RECOMMENDATION:
Screening mammogram in one year.(Code:MW-[id number])

I have discussed the findings and recommendations with the patient.
If applicable, a reminder letter will be sent to the patient
regarding the next appointment.

BI-RADS CATEGORY  2: Benign.

## 2022-08-16 DIAGNOSIS — M5451 Vertebrogenic low back pain: Secondary | ICD-10-CM | POA: Diagnosis not present

## 2022-08-16 NOTE — Telephone Encounter (Signed)
Called the pt and LVM (ok per DPR) asking for call back. I advised that Dr Frances Furbish reviewed the download we pulled from pt's cpap machine but unfortunately there was so little data that she could not make any recommendations other than recommending a titration sleep study where we can bring her into the sleep lab and see how she does on cpap and make changes to mask and settings during the real time to find the best setting.

## 2022-08-17 ENCOUNTER — Other Ambulatory Visit: Payer: Self-pay | Admitting: Internal Medicine

## 2022-08-17 NOTE — Telephone Encounter (Signed)
The patient returned my call and had not heard my VM. I discussed the message from Dr Frances Furbish. The patient is amenable to proceeding with a titration sleep study. We discussed the type of study it is and she feels optimistic this may be helpful in her tolerance of the cpap. The patient states she is having trouble with claustrophobia. I asked her if she feels there's too much pressure or too little pressure and she said "both". The patient understands Dr Frances Furbish didn't have enough data to analyze and make recommendation. We will try this titration study. The patient has an appt scheduled for 08/26/22 with Maralyn Sago NP. I told her to keep it for now. She may need that appt possibly per insurance. Patient verbalized appreciation for the call.

## 2022-08-20 DIAGNOSIS — H209 Unspecified iridocyclitis: Secondary | ICD-10-CM | POA: Diagnosis not present

## 2022-08-24 DIAGNOSIS — G4733 Obstructive sleep apnea (adult) (pediatric): Secondary | ICD-10-CM | POA: Diagnosis not present

## 2022-08-24 DIAGNOSIS — M5451 Vertebrogenic low back pain: Secondary | ICD-10-CM | POA: Diagnosis not present

## 2022-08-25 ENCOUNTER — Telehealth: Payer: Self-pay

## 2022-08-25 ENCOUNTER — Telehealth: Payer: Self-pay | Admitting: Nurse Practitioner

## 2022-08-25 NOTE — Telephone Encounter (Signed)
Spoke with patient who has questions about her results, I tried to explain the results before being interupted.   Patient then states she upset that she is starting to have all these health issues after retirement. She states she has to now see a kidney doctor and something is wrong with her liver. I apologized to the patient because she is having so many healthy issues, but the information I'm giving her is the recommendations are from the radiologist and the doctor reading the test.  Then she states the Repatha is now forty dollars and last year it was five dollars. I explained to the patient that I have no control over the cost of her medication and maybe with the change in her insurance that could have something to do with the cost. I asked her if she would like for me to send a message to the pharmacist and she said no and they explained to her something, but she doesn't remember what they said.  The patient states she has not started the Imdur yet, because she read the side effects on the label and is too scared. She states had palpitations two times this week and thinks that maybe she start the Imdur. She wants to know what we think she should do?    The patient then starts to ask be about previous test and their results. I tried to advised the patient that I wanted to focus on the test results I called her about. She then seemed to be  getting confused and asked me what was the first thing we spoke about when I called her.  The patient kept asking me the same questions repeatedly and I kept repeating myself, but after being on the phone for thirty-two minutes I advised the patient that I would speak with her daughter for time management purposes especially since I was in clinic. The patient stated she did not think it was necessary to bother her daughter since she was at work, but I explained to her that we have been on the phone for thirty minutes and she keeps asking me the same questions.  Finally the patient became agreeable.  She voiced understanding and requested I contact her daughter, Donnella Sham.

## 2022-08-25 NOTE — Telephone Encounter (Signed)
Patient returned call for her CT chest results.

## 2022-08-25 NOTE — Telephone Encounter (Signed)
See other phone note

## 2022-08-26 ENCOUNTER — Encounter: Payer: Self-pay | Admitting: Neurology

## 2022-08-26 ENCOUNTER — Other Ambulatory Visit: Payer: Self-pay | Admitting: Internal Medicine

## 2022-08-26 ENCOUNTER — Ambulatory Visit: Payer: Medicare PPO | Admitting: Neurology

## 2022-08-26 VITALS — BP 127/66 | HR 75 | Ht 65.5 in | Wt 197.8 lb

## 2022-08-26 DIAGNOSIS — I739 Peripheral vascular disease, unspecified: Secondary | ICD-10-CM

## 2022-08-26 DIAGNOSIS — G4733 Obstructive sleep apnea (adult) (pediatric): Secondary | ICD-10-CM | POA: Insufficient documentation

## 2022-08-26 DIAGNOSIS — N1831 Chronic kidney disease, stage 3a: Secondary | ICD-10-CM

## 2022-08-26 DIAGNOSIS — I1310 Hypertensive heart and chronic kidney disease without heart failure, with stage 1 through stage 4 chronic kidney disease, or unspecified chronic kidney disease: Secondary | ICD-10-CM

## 2022-08-26 DIAGNOSIS — M5451 Vertebrogenic low back pain: Secondary | ICD-10-CM | POA: Diagnosis not present

## 2022-08-26 DIAGNOSIS — I2510 Atherosclerotic heart disease of native coronary artery without angina pectoris: Secondary | ICD-10-CM

## 2022-08-26 DIAGNOSIS — E049 Nontoxic goiter, unspecified: Secondary | ICD-10-CM

## 2022-08-26 DIAGNOSIS — G4730 Sleep apnea, unspecified: Secondary | ICD-10-CM | POA: Insufficient documentation

## 2022-08-26 DIAGNOSIS — R8290 Unspecified abnormal findings in urine: Secondary | ICD-10-CM

## 2022-08-26 NOTE — Progress Notes (Signed)
Patient: Dawn Wood Date of Birth: 1945/04/28  Reason for Visit: Follow up History from: Patient Primary Neurologist: [facility]   ASSESSMENT AND PLAN 77 y.o. year old female   OSA, not using CPAP  -Reports she cannot tolerate, despite pressure reduction down to 7-11 cm water, trying nasal pillow mask, FFM.  For this reason, CPAP titration has been ordered.  Our sleep department is currently working on authorization.  The patient is motivated to pursue CPAP, she feels she stands to have very good benefit.  I have asked her to follow-up with me if she does not hear something soon about scheduling CPAP titration.  I will go ahead and schedule 30-month follow-up as a placeholder, we can push this out if we see her sooner post titration.  HISTORY OF PRESENT ILLNESS: Today 08/26/22 At last visit in May 2023 with Dr. Frances Furbish, plan to pursue CPAP.  Insurance required a new sleep study.  HST in March 2024 showed mild sleep apnea with a total AHI of 12.5/hour and O2 nadir of 88%  .  Since starting CPAP, several calls that she cannot tolerate.  An in lab sleep titration has been ordered.  Her compliance is poor. Mentions everything is going bad for her right now with her health. Told enlarged heart, needs [place] of her thyroid. Eye issues since cataract repair. Claims cannot tolerate the CPAP with the pressure, feels like she is going to suffocate. Not using CPAP at all. Started using nasal pillow mask, pressure was too much, tried full face mask made her claustrophobic. CPAP titration has been ordered but she is not approved. She knows she needs CPAP, not getting proper sleep at night. Sleeping in recliner, less headaches, less nocturia. Has daytime fatigue, sleeps more during the day than at night. ESS 10. Has lost about 10 lbs.   Respiratory Indices:    Calculated pAHI (per hour):    29.6/hour         12.5/h (with 4% desaturation criteria for hypopneas per Medicare guidelines)               REM  pAHI:    53.3/hour                         NREM pAHI:   22.2/hour   Central pAHI: 2.1/hour  CPAP data usage is very poor 05/11/2022-08/08/22 only used 6 days, for 12 minutes.  Data is not adequate for analysis.  It showed AHI 25, leak 78.  HISTORY  07/30/21 Dr. Frances Furbish Dawn Wood is a 77 year old right-handed woman with an underlying medical history of back pain, gout, coronary artery disease with history of MI, hypertension, peripheral vascular disease, reflux disease, diabetes and obesity, who presents for follow up consultation of her sleep disorder. The patient is unaccompanied today and presents after over 2 years. I last saw her on 06/05/19, at which time we talked about her sleep study results from 05/18/2019.  Overall AHI was 13.1/h, REM AHI 30.4/h, supine AHI 17.9/h, O2 nadir 81%.  She was encouraged to consider CPAP or AutoPap therapy.  I had encouraged her to return for proper titration study.  She declined this.  She also declined starting AutoPap therapy.  We also talked about alternative treatment options.  We talked about a referral to dentistry although she had trouble tolerating partial dentures in the past and was missing several teeth.  I offered a consultation request with dentistry.  She declined a referral to dentistry.  She was working on weight loss.   Today, 07/30/2021: She report worsening daytime somnolence.  She does not wake up rested.  She does not have recurrent morning headaches but has nocturia about 3-4 times per average night.  Her sleep disruption has become worse.  She is getting ready to retire.  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  She will have an insurance change because of her retirement soon.  She would be willing to consider AutoPap therapy.  She has gained a little bit of weight since we did sleep testing 2 years ago.  She often naps after she comes home from work.  She may sleep for 2 to 3 hours and then go to bed between 9 and 10 PM.  Rise time is  around 3 AM, she has to be at work at 5 AM.  Her Epworth sleepiness score is 15 out of 24, fatigue severity score is 53 out of 63.  REVIEW OF SYSTEMS: Out of a complete 14 system review of symptoms, the patient complains only of the following symptoms, and all other reviewed systems are negative.  See HPI  ALLERGIES: Allergies  Allergen Reactions   Dust Mite Extract Other (See Comments)    REACTION: SNEEZING   Other Other (See Comments)    ALLERGEN: DEODORIZERS SPRAY AND PERFUMES REACTION: SNEEZING   Statins Other (See Comments)    Muscle aches with rosuvastatin 5 mg daily and 3x/weekly, atorvastatin 20 mg daily and 3x/week, simvastatin 40 mg daily    Allopurinol     Diarrhea and the pt said it made her sugars low   Pollen Extract Other (See Comments)    Reaction unknown    HOME MEDICATIONS: Outpatient Medications Prior to Visit  Medication Sig Dispense Refill   Accu-Chek Softclix Lancets lancets USE TO CHECK BLOOD SUGARS ONCE DAILY. 100 each 12   apixaban (ELIQUIS) 5 MG TABS tablet Take 1 tablet (5 mg total) by mouth 2 (two) times daily. 180 tablet 1   BD PEN NEEDLE NANO 2ND GEN 32G X 4 MM MISC USE AS DIRECTED TWICE DAILY 100 each 2   Blood Glucose Monitoring Suppl (ACCU-CHEK GUIDE) w/Device KIT USE TO CHECK BLOOD SUGARS ONCE DAILY. 1 kit 0   chlorthalidone (HYGROTON) 25 MG tablet Take 1 tablet (25 mg total) by mouth daily. 90 tablet 1   Cholecalciferol (VITAMIN D3) 2000 units TABS Take 1 tablet by mouth daily.     Coenzyme Q10 (CO Q 10) 10 MG CAPS Take by mouth daily.     colchicine 0.6 MG tablet Take 1 tablet (0.6 mg total) by mouth 2 (two) times daily. 180 tablet 3   dorzolamide-timolol (COSOPT) 2-0.5 % ophthalmic solution Place 1 drop into the right eye 2 (two) times daily.     Evolocumab (REPATHA SURECLICK) 140 MG/ML SOAJ Inject 140 mg into the skin every 14 (fourteen) days. 6 mL 1   famotidine (PEPCID) 20 MG tablet TAKE 1 TABLET(20 MG) BY MOUTH TWICE DAILY 180 tablet 1    fluticasone (FLONASE) 50 MCG/ACT nasal spray SHAKE LIQUID AND USE 1 SPRAY IN EACH NOSTRIL DAILY AS NEEDED FOR ALLERGIES OR RHINITIS 16 g 2   gabapentin (NEURONTIN) 100 MG capsule One tab po qhs x 1 week, then 2 tabs po qhs 180 capsule 0   glucose blood (ACCU-CHEK GUIDE) test strip USE TO CHECK BLOOD SUGARS ONCE DAILY. 100 each 12   insulin aspart protamine - aspart (NOVOLOG MIX 70/30 FLEXPEN) (70-30) 100 UNIT/ML  FlexPen INJECT 40 UNITS SUBCUTANEOUS EVERY MORNING AND 50 UNITS SUBCUTANEOUS AT DINNER 75 mL 2   isosorbide mononitrate (IMDUR) 30 MG 24 hr tablet Take 0.5-1 tablets (15-30 mg total) by mouth daily. 30 tablet 3   ketorolac (ACULAR) 0.5 % ophthalmic solution Place 1 drop into the right eye 3 (three) times daily.     lidocaine-prilocaine (EMLA) cream      metoprolol succinate (TOPROL-XL) 100 MG 24 hr tablet Take 1 tablet (100 mg total) by mouth daily. Take with or immediately following a meal. 90 tablet 1   nitroGLYCERIN (NITROSTAT) 0.4 MG SL tablet Place 1 tablet (0.4 mg total) under the tongue every 5 (five) minutes as needed for chest pain. 25 tablet 3   olmesartan (BENICAR) 40 MG tablet TAKE 1 TABLET(40 MG) BY MOUTH DAILY 90 tablet 2   ONETOUCH VERIO test strip USE TO CHECK BLOOD SUGARS TWICE DAILY AS DIRECTED 100 strip 3   Semaglutide,0.25 or 0.5MG /DOS, (OZEMPIC, 0.25 OR 0.5 MG/DOSE,) 2 MG/1.5ML SOPN INJECT 0.5MG  INTO SKIN ONCE A WEEK 4.5 mL 3   spironolactone (ALDACTONE) 25 MG tablet Take 1 tablet (25 mg total) by mouth daily. 90 tablet 1   traMADol (ULTRAM) 50 MG tablet Take 50 mg by mouth daily at 6 (six) AM.     VYZULTA 0.024 % SOLN Apply 1 drop to eye in the morning, at noon, and at bedtime.     Facility-Administered Medications Prior to Visit  Medication Dose Route Frequency Provider Last Rate Last Admin   nitroGLYCERIN (NITROSTAT) SL tablet 0.4 mg  0.4 mg Sublingual Q5 min PRN Sharlene Dory, PA-C        PAST MEDICAL HISTORY: Past Medical History:  Diagnosis Date   Coronary  artery disease    Diabetes mellitus without complication (HCC)    GERD (gastroesophageal reflux disease)    Hypertension    MI (myocardial infarction) (HCC)    Peripheral vascular disease (HCC)     PAST SURGICAL HISTORY: Past Surgical History:  Procedure Laterality Date   ABDOMINAL HYSTERECTOMY  1985   APPENDECTOMY     CATARACT EXTRACTION Left 02/20/2018   Dr. Harlon Flor   CATARACT EXTRACTION Bilateral 2024   cataract surgery Right 01/25/2022   Second at [facility] 11/29   CORONARY STENT PLACEMENT  2015   ectopic pregnancy--unilateral salpingectomy  1980   KNEE ARTHROSCOPY Left 03/2016   unilateral oophorectomy      FAMILY HISTORY: Family History  Problem Relation Age of Onset   Hypertension Mother    Cancer Father    Hypertension Father    Breast cancer Sister 3   Aortic stenosis Daughter    Heart disease Daughter        before age 46   Thyroid disease Daughter    Sleep apnea Neg Hx     SOCIAL HISTORY: Social History   Socioeconomic History   Marital status: Single    Spouse name: Not on file   Number of children: 2   Years of education: Not on file   Highest education level: Not on file  Occupational History   Not on file  Tobacco Use   Smoking status: Former    Packs/day: 0.25    Years: 45.00    Additional pack years: 0.00    Total pack years: 11.25    Types: Cigarettes    Quit date: 01/10/2013    Years since quitting: 9.6   Smokeless tobacco: Former  Building services engineer Use: Never used  Substance  and Sexual Activity   Alcohol use: Not Currently    Alcohol/week: 0.0 standard drinks of alcohol   Drug use: No   Sexual activity: Not Currently  Other Topics Concern   Not on file  Social History Narrative   Not on file   Social Determinants of Health   Financial Resource Strain: High Risk (08/11/2021)   Overall Financial Resource Strain (CARDIA)    Difficulty of Paying Living Expenses: Very hard  Food Insecurity: No Food Insecurity (10/29/2021)   Hunger  Vital Sign    Worried About Running Out of Food in the Last Year: Never true    Ran Out of Food in the Last Year: Never true  Transportation Needs: No Transportation Needs (10/29/2021)   PRAPARE - Administrator, Civil Service (Medical): No    Lack of Transportation (Non-Medical): No  Physical Activity: Inactive (10/20/2021)   Exercise Vital Sign    Days of Exercise per Week: 0 days    Minutes of Exercise per Session: 0 min  Stress: No Stress Concern Present (10/20/2021)   [employer] of Occupational Health - Occupational Stress Questionnaire    Feeling of Stress : Only a little  Social Connections: Socially Isolated (10/20/2021)   Social Connection and Isolation Panel [NHANES]    Frequency of Communication with Friends and Family: More than three times a week    Frequency of Social Gatherings with Friends and Family: Once a week    Attends Religious Services: Never    Database administrator or Organizations: No    Attends Banker Meetings: Never    Marital Status: Never married  Intimate Partner Violence: Not At Risk (10/20/2021)   Humiliation, Afraid, Rape, and Kick questionnaire    Fear of Current or Ex-Partner: No    Emotionally Abused: No    Physically Abused: No    Sexually Abused: No    PHYSICAL EXAM  Vitals:   08/26/22 1003  BP: 127/66  Pulse: 75  Weight: 197 lb 12.8 oz (89.7 kg)  Height: 5' 5.5" (1.664 m)   Body mass index is 32.42 kg/m.  Generalized: Well developed, in no acute distress  Neurological examination  Mentation: Alert oriented to time, place, history taking. Follows all commands speech and language fluent Cranial nerve II-XII: Pupils were equal round reactive to light. Extraocular movements were full, visual field were full on confrontational test. Facial sensation and strength were normal. Head turning and shoulder shrug  were normal and symmetric. Motor: The motor testing reveals 5 over 5 strength of all 4 extremities.  Good symmetric motor tone is noted throughout.  Sensory: Sensory testing is intact to soft touch on all 4 extremities. No evidence of extinction is noted.  Coordination: Cerebellar testing reveals good finger-nose-finger bilaterally  Gait and station: Gait is normal.   DIAGNOSTIC DATA (LABS, IMAGING, TESTING) - I reviewed patient records, labs, notes, testing and imaging myself where available.  Lab Results  Component Value Date   WBC 9.5 08/05/2022   HGB 12.9 08/05/2022   HCT 38.9 08/05/2022   MCV 86 08/05/2022   PLT 274 08/05/2022      Component Value Date/Time   NA 141 08/05/2022 1533   NA 140 05/29/2013 0604   K 4.2 08/05/2022 1533   K 3.7 05/29/2013 0604   CL 103 08/05/2022 1533   CL 107 05/29/2013 0604   CO2 26 08/05/2022 1533   CO2 27 05/29/2013 0604   GLUCOSE 110 (H) 08/05/2022 1533  GLUCOSE 79 11/29/2018 1135   GLUCOSE 139 (H) 05/29/2013 0604   BUN 24 08/05/2022 1533   BUN 10 05/29/2013 0604   CREATININE 1.43 (H) 08/05/2022 1533   CREATININE 0.94 (H) 11/29/2018 1135   CALCIUM 9.3 08/05/2022 1533   CALCIUM 8.7 05/29/2013 0604   PROT 6.6 08/05/2022 1533   ALBUMIN 4.0 08/05/2022 1533   AST 13 08/05/2022 1533   ALT 16 08/05/2022 1533   ALKPHOS 84 08/05/2022 1533   BILITOT 0.4 08/05/2022 1533   GFRNONAA 46 (L) 02/11/2020 1503   GFRNONAA 60 11/29/2018 1135   GFRAA 53 (L) 02/11/2020 1503   GFRAA 69 11/29/2018 1135   Lab Results  Component Value Date   CHOL 195 05/04/2022   HDL 49 05/04/2022   LDLCALC 121 (H) 05/04/2022   LDLDIRECT 83 06/11/2019   TRIG 140 05/04/2022   CHOLHDL 4.0 05/04/2022   Lab Results  Component Value Date   HGBA1C 6.8 (H) 08/05/2022   Lab Results  Component Value Date   VITAMINB12 519 12/18/2020   Lab Results  Component Value Date   TSH 1.180 10/14/2020    Margie Ege, AGNP-C, DNP 08/26/2022, 10:13 AM [facility] [address], [address] [place], [place] [address] [phone]

## 2022-08-26 NOTE — Patient Instructions (Signed)
We are working to get insurance authorization for in lab titration for CPAP. Please let me know if you don't hear anything.

## 2022-08-27 NOTE — Telephone Encounter (Signed)
Attempted to reach the patients daughter but call went straight to voicemail. Requested a call back to discuss patient's test results.

## 2022-08-30 ENCOUNTER — Other Ambulatory Visit: Payer: Self-pay

## 2022-08-30 MED ORDER — NITROGLYCERIN 0.4 MG SL SUBL: 0.4000 mg | SUBLINGUAL_TABLET | SUBLINGUAL | 11 refills | Status: DC | PRN

## 2022-08-30 NOTE — Telephone Encounter (Signed)
Patients daughter, Donnella Sham, has been notified directly and voiced understanding.

## 2022-08-31 DIAGNOSIS — K219 Gastro-esophageal reflux disease without esophagitis: Secondary | ICD-10-CM | POA: Diagnosis not present

## 2022-08-31 DIAGNOSIS — K7581 Nonalcoholic steatohepatitis (NASH): Secondary | ICD-10-CM | POA: Diagnosis not present

## 2022-08-31 DIAGNOSIS — K5730 Diverticulosis of large intestine without perforation or abscess without bleeding: Secondary | ICD-10-CM | POA: Diagnosis not present

## 2022-09-01 DIAGNOSIS — M5451 Vertebrogenic low back pain: Secondary | ICD-10-CM | POA: Diagnosis not present

## 2022-09-03 DIAGNOSIS — H209 Unspecified iridocyclitis: Secondary | ICD-10-CM | POA: Diagnosis not present

## 2022-09-14 ENCOUNTER — Ambulatory Visit
Admission: RE | Admit: 2022-09-14 | Discharge: 2022-09-14 | Disposition: A | Discharge status: Home / Self Care | Payer: Medicare PPO | Source: Ambulatory Visit | Attending: Internal Medicine | Admitting: Internal Medicine

## 2022-09-14 DIAGNOSIS — E042 Nontoxic multinodular goiter: Secondary | ICD-10-CM | POA: Diagnosis not present

## 2022-09-14 DIAGNOSIS — E049 Nontoxic goiter, unspecified: Secondary | ICD-10-CM

## 2022-09-14 NOTE — Telephone Encounter (Signed)
Cpap- [employer]: [id number] (exp. 09/06/22 to 01/04/23)   Patient is scheduled at [facility] for 11/30/22 at 8 pm.  Mailed packet to the patient.  She also informed me since she has not been using her CPAP machine she has to return the device back on 09/23/22 due to the insurance will not continue to pay for it.

## 2022-09-20 ENCOUNTER — Other Ambulatory Visit: Payer: Self-pay | Admitting: Internal Medicine

## 2022-09-20 DIAGNOSIS — R768 Other specified abnormal immunological findings in serum: Secondary | ICD-10-CM

## 2022-09-20 DIAGNOSIS — M1A09X0 Idiopathic chronic gout, multiple sites, without tophus (tophi): Secondary | ICD-10-CM

## 2022-10-11 ENCOUNTER — Other Ambulatory Visit: Payer: Self-pay

## 2022-10-11 DIAGNOSIS — I10 Essential (primary) hypertension: Secondary | ICD-10-CM

## 2022-10-11 DIAGNOSIS — R600 Localized edema: Secondary | ICD-10-CM

## 2022-10-11 DIAGNOSIS — Z79899 Other long term (current) drug therapy: Secondary | ICD-10-CM

## 2022-10-11 MED ORDER — CHLORTHALIDONE 25 MG PO TABS
25.0000 mg | ORAL_TABLET | Freq: Every day | ORAL | 2 refills | Status: AC
Start: 2022-10-11 — End: ?

## 2022-10-11 NOTE — Telephone Encounter (Signed)
Pt's medication was sent to pt's pharmacy as requested. Confirmation received.  °

## 2022-10-18 NOTE — Telephone Encounter (Signed)
I spoke with the patient and informed her that I had a cancellation for Monday 10/25/2022 at 8 pm.  She stated she would take that appointment.  I mailed her a new packet.

## 2022-10-21 ENCOUNTER — Ambulatory Visit: Payer: Medicare PPO

## 2022-10-21 ENCOUNTER — Ambulatory Visit: Payer: Medicare PPO | Admitting: Internal Medicine

## 2022-10-21 ENCOUNTER — Encounter: Payer: Self-pay | Admitting: Internal Medicine

## 2022-10-21 VITALS — BP 128/70 | HR 69 | Temp 98.5°F | Ht 65.5 in | Wt 211.0 lb

## 2022-10-21 VITALS — BP 128/70 | HR 69 | Temp 98.5°F | Ht 65.0 in | Wt 211.0 lb

## 2022-10-21 DIAGNOSIS — M1A09X0 Idiopathic chronic gout, multiple sites, without tophus (tophi): Secondary | ICD-10-CM

## 2022-10-21 DIAGNOSIS — E049 Nontoxic goiter, unspecified: Secondary | ICD-10-CM | POA: Diagnosis not present

## 2022-10-21 DIAGNOSIS — R0602 Shortness of breath: Secondary | ICD-10-CM

## 2022-10-21 DIAGNOSIS — Z794 Long term (current) use of insulin: Secondary | ICD-10-CM | POA: Diagnosis not present

## 2022-10-21 DIAGNOSIS — Z0000 Encounter for general adult medical examination without abnormal findings: Secondary | ICD-10-CM

## 2022-10-21 DIAGNOSIS — Z6835 Body mass index (BMI) 35.0-35.9, adult: Secondary | ICD-10-CM | POA: Diagnosis not present

## 2022-10-21 DIAGNOSIS — I2510 Atherosclerotic heart disease of native coronary artery without angina pectoris: Secondary | ICD-10-CM

## 2022-10-21 DIAGNOSIS — I1310 Hypertensive heart and chronic kidney disease without heart failure, with stage 1 through stage 4 chronic kidney disease, or unspecified chronic kidney disease: Secondary | ICD-10-CM

## 2022-10-21 DIAGNOSIS — N1831 Chronic kidney disease, stage 3a: Secondary | ICD-10-CM | POA: Diagnosis not present

## 2022-10-21 DIAGNOSIS — E1122 Type 2 diabetes mellitus with diabetic chronic kidney disease: Secondary | ICD-10-CM

## 2022-10-21 NOTE — Progress Notes (Signed)
Subjective:   Dawn Wood is a 77 y.o. female who presents for Medicare Annual (Subsequent) preventive examination.  Visit Complete: In person    Review of Systems     Cardiac Risk Factors include: advanced age (>19men, >68 women);diabetes mellitus;dyslipidemia;hypertension;obesity (BMI >30kg/m2)     Objective:    Today's Vitals   10/21/22 1123 10/21/22 1124  BP: 128/70   Pulse: 69   Temp: 98.5 F (36.9 C)   TempSrc: Oral   SpO2: 97%   Weight: 211 lb (95.7 kg)   Height: 5' 5.5" (1.664 m)   PainSc:  6    Body mass index is 34.58 kg/m.     10/21/2022   11:32 AM 10/20/2021    3:36 PM 10/03/2019    9:23 AM 06/03/2016   12:19 PM 04/09/2015    3:47 PM 02/19/2014    9:46 AM  Advanced Directives  Does Patient Have a Medical Advance Directive? No No No No No No  Would patient like information on creating a medical advance directive?  Yes (MAU/Ambulatory/Procedural Areas - Information given) Yes (ED - Information included in AVS)  No - patient declined information Yes - Educational materials given    Current Medications (verified) Outpatient Encounter Medications as of 10/21/2022  Medication Sig   Accu-Chek Softclix Lancets lancets USE TO CHECK BLOOD SUGARS ONCE DAILY.   apixaban (ELIQUIS) 5 MG TABS tablet Take 1 tablet (5 mg total) by mouth 2 (two) times daily.   BD PEN NEEDLE NANO 2ND GEN 32G X 4 MM MISC USE AS DIRECTED TWICE DAILY   Blood Glucose Monitoring Suppl (ACCU-CHEK GUIDE) w/Device KIT USE TO CHECK BLOOD SUGARS ONCE DAILY.   chlorthalidone (HYGROTON) 25 MG tablet Take 1 tablet (25 mg total) by mouth daily.   Cholecalciferol (VITAMIN D3) 2000 units TABS Take 1 tablet by mouth daily.   colchicine 0.6 MG tablet Take 1 tablet (0.6 mg total) by mouth 2 (two) times daily.   dorzolamide-timolol (COSOPT) 2-0.5 % ophthalmic solution Place 1 drop into the right eye 2 (two) times daily.   Evolocumab (REPATHA SURECLICK) 140 MG/ML SOAJ Inject 140 mg into the skin  every 14 (fourteen) days.   fluticasone (FLONASE) 50 MCG/ACT nasal spray SHAKE LIQUID AND USE 1 SPRAY IN EACH NOSTRIL DAILY AS NEEDED FOR ALLERGIES OR RHINITIS   glucose blood (ACCU-CHEK GUIDE) test strip USE TO CHECK BLOOD SUGARS ONCE DAILY.   insulin aspart protamine - aspart (NOVOLOG MIX 70/30 FLEXPEN) (70-30) 100 UNIT/ML FlexPen INJECT 40 UNITS SUBCUTANEOUS EVERY MORNING AND 50 UNITS SUBCUTANEOUS AT DINNER   isosorbide mononitrate (IMDUR) 30 MG 24 hr tablet Take 0.5-1 tablets (15-30 mg total) by mouth daily.   ketorolac (ACULAR) 0.5 % ophthalmic solution Place 1 drop into the right eye 3 (three) times daily.   lidocaine-prilocaine (EMLA) cream    metoprolol succinate (TOPROL-XL) 100 MG 24 hr tablet Take 1 tablet (100 mg total) by mouth daily. Take with or immediately following a meal.   nitroGLYCERIN (NITROSTAT) 0.4 MG SL tablet Place 1 tablet (0.4 mg total) under the tongue every 5 (five) minutes as needed for chest pain.   olmesartan (BENICAR) 40 MG tablet TAKE 1 TABLET(40 MG) BY MOUTH DAILY   ONETOUCH VERIO test strip USE TO CHECK BLOOD SUGARS TWICE DAILY AS DIRECTED   Semaglutide,0.25 or 0.5MG /DOS, (OZEMPIC, 0.25 OR 0.5 MG/DOSE,) 2 MG/1.5ML SOPN INJECT 0.5MG  INTO SKIN ONCE A WEEK   spironolactone (ALDACTONE) 25 MG tablet Take 1 tablet (25 mg total) by mouth daily.  traMADol (ULTRAM) 50 MG tablet Take 50 mg by mouth daily at 6 (six) AM.   VYZULTA 0.024 % SOLN Apply 1 drop to eye in the morning, at noon, and at bedtime.   Coenzyme Q10 (CO Q 10) 10 MG CAPS Take by mouth daily. (Patient not taking: Reported on 10/21/2022)   famotidine (PEPCID) 20 MG tablet TAKE 1 TABLET(20 MG) BY MOUTH TWICE DAILY (Patient not taking: Reported on 10/21/2022)   gabapentin (NEURONTIN) 100 MG capsule One tab po qhs x 1 week, then 2 tabs po qhs (Patient not taking: Reported on 10/21/2022)   Facility-Administered Encounter Medications as of 10/21/2022  Medication   nitroGLYCERIN (NITROSTAT) SL tablet 0.4 mg     Allergies (verified) Dust mite extract, Other, Statins, Allopurinol, and Pollen extract   History: Past Medical History:  Diagnosis Date   Coronary artery disease    Diabetes mellitus without complication (HCC)    GERD (gastroesophageal reflux disease)    Hypertension    MI (myocardial infarction) (HCC)    Peripheral vascular disease (HCC)    Past Surgical History:  Procedure Laterality Date   ABDOMINAL HYSTERECTOMY  1985   APPENDECTOMY     CATARACT EXTRACTION Left 02/20/2018   Dr. Harlon Flor   CATARACT EXTRACTION Bilateral 2024   cataract surgery Right 01/25/2022   Second at [facility] 11/29   CORONARY STENT PLACEMENT  2015   ectopic pregnancy--unilateral salpingectomy  1980   KNEE ARTHROSCOPY Left 03/2016   unilateral oophorectomy     Family History  Problem Relation Age of Onset   Hypertension Mother    Cancer Father    Hypertension Father    Breast cancer Sister 77   Aortic stenosis Daughter    Heart disease Daughter        before age 18   Thyroid disease Daughter    Sleep apnea Neg Hx    Social History   Socioeconomic History   Marital status: Single    Spouse name: Not on file   Number of children: 2   Years of education: Not on file   Highest education level: Not on file  Occupational History   Not on file  Tobacco Use   Smoking status: Former    Current packs/day: 0.00    Average packs/day: 0.3 packs/day for 45.0 years (11.3 ttl pk-yrs)    Types: Cigarettes    Start date: 01/11/1968    Quit date: 01/10/2013    Years since quitting: 9.7   Smokeless tobacco: Former  Building services engineer status: Never Used  Substance and Sexual Activity   Alcohol use: Not Currently    Alcohol/week: 0.0 standard drinks of alcohol   Drug use: No   Sexual activity: Not Currently  Other Topics Concern   Not on file  Social History Narrative   Not on file   Social Determinants of Health   Financial Resource Strain: Low Risk  (10/21/2022)   Overall Financial Resource  Strain (CARDIA)    Difficulty of Paying Living Expenses: Not hard at all  Food Insecurity: No Food Insecurity (10/21/2022)   Hunger Vital Sign    Worried About Running Out of Food in the Last Year: Never true    Ran Out of Food in the Last Year: Never true  Transportation Needs: No Transportation Needs (10/21/2022)   PRAPARE - Administrator, Civil Service (Medical): No    Lack of Transportation (Non-Medical): No  Physical Activity: Inactive (10/21/2022)   Exercise Vital Sign  Days of Exercise per Week: 0 days    Minutes of Exercise per Session: 0 min  Stress: No Stress Concern Present (10/21/2022)   [employer] of Occupational Health - Occupational Stress Questionnaire    Feeling of Stress : Not at all  Social Connections: Socially Isolated (10/21/2022)   Social Connection and Isolation Panel [NHANES]    Frequency of Communication with Friends and Family: More than three times a week    Frequency of Social Gatherings with Friends and Family: Twice a week    Attends Religious Services: Never    Database administrator or Organizations: No    Attends Engineer, structural: Never    Marital Status: Never married    Tobacco Counseling Counseling given: Not Answered   Clinical Intake:  Pre-visit preparation completed: Yes  Pain : 0-10 Pain Score: 6  Pain Type: Chronic pain Pain Location: Back Pain Orientation: Lower Pain Descriptors / Indicators: Aching Pain Onset: More than a month ago Pain Frequency: Constant     Nutritional Status: BMI > 30  Obese Nutritional Risks: Nausea/ vomitting/ diarrhea (has a little diarrhea from medicine) Diabetes: Yes CBG done?: No Did pt. bring in CBG monitor from home?: No  How often do you need to have someone help you when you read instructions, pamphlets, or other written materials from your doctor or pharmacy?: 1 - Never  Interpreter Needed?: No  Information entered by :: NAllen LPN   Activities of Daily  Living    10/21/2022   11:26 AM  In your present state of health, do you have any difficulty performing the following activities:  Hearing? 0  Vision? 0  Difficulty concentrating or making decisions? 1  Comment wants to talk to Dr. Allyne Gee  Walking or climbing stairs? 0  Dressing or bathing? 0  Doing errands, shopping? 0  Preparing Food and eating ? N  Using the Toilet? N  In the past six months, have you accidently leaked urine? N  Do you have problems with loss of bowel control? N  Managing your Medications? N  Managing your Finances? N  Housekeeping or managing your Housekeeping? N    Patient Care Team: Dorothyann Peng, MD as PCP - General (Internal Medicine) Dorothyann Peng, MD as Attending Physician (Internal Medicine) Chalmers Guest, MD as Consulting Physician (Ophthalmology) Harlan Stains, [facility] (Inactive) (Pharmacist)  Indicate any recent Medical Services you may have received from other than [facility] providers in the past year (date may be approximate).     Assessment:   This is a routine wellness examination for Nishtha.  Hearing/Vision screen Hearing Screening - Comments:: Denies hearing issues Vision Screening - Comments:: Regular eye exams, [facility], Dr. Randon Goldsmith  Dietary issues and exercise activities discussed:     Goals Addressed             This Visit's Progress    Patient Stated       10/21/2022, wants to feel better       Depression Screen    10/21/2022   11:34 AM 05/04/2022   10:51 AM 10/20/2021    2:54 PM 06/18/2021    2:56 PM 12/18/2020    3:40 PM 10/14/2020   10:01 AM 10/03/2019    9:23 AM  PHQ 2/9 Scores  PHQ - 2 Score 3 0 2 0 1 0 0  PHQ- 9 Score 9  8 0 3 0     Fall Risk    10/21/2022   11:33 AM 05/04/2022  10:51 AM 10/20/2021    3:00 PM 06/18/2021    2:56 PM 06/10/2020    2:29 PM  Fall Risk   Falls in the past year? 0 0 0 0 0  Number falls in past yr: 0 0 0 0   Injury with Fall? 0 0 0 0   Risk for fall due to : Medication side  effect No Fall Risks No Fall Risks No Fall Risks   Follow up Falls prevention discussed;Falls evaluation completed Falls evaluation completed Falls evaluation completed Falls evaluation completed     MEDICARE RISK AT HOME:  Medicare Risk at Home - 10/21/22 1134     Any stairs in or around the home? Yes    If so, are there any without handrails? Yes    Home free of loose throw rugs in walkways, pet beds, electrical cords, etc? Yes    Adequate lighting in your home to reduce risk of falls? Yes    Life alert? No    Use of a cane, walker or w/c? No    Grab bars in the bathroom? No    Shower chair or bench in shower? Yes    Elevated toilet seat or a handicapped toilet? No             TIMED UP AND GO:  Was the test performed?  Yes  Length of time to ambulate 10 feet: 8 sec Gait slow and steady without use of assistive device    Cognitive Function:        10/21/2022   11:36 AM 10/20/2021    3:01 PM 10/04/2019   10:19 AM  6CIT Screen  What Year? 0 points 0 points 0 points  What month? 0 points 0 points 0 points  What time? 0 points 0 points 0 points  Count back from 20 0 points 0 points 0 points  Months in reverse 0 points 0 points 0 points  Repeat phrase 2 points 2 points 0 points  Total Score 2 points 2 points 0 points    Immunizations Immunization History  Administered Date(s) Administered   Fluad Quad(high Dose 65+) 04/06/2021, 12/30/2021   Janssen (J&J) SARS-COV-2 Vaccination 05/25/2019   PFIZER(Purple Top)SARS-COV-2 Vaccination 02/21/2020   Tdap 11/04/2017    TDAP status: Up to date  Flu Vaccine status: Due, Education has been provided regarding the importance of this vaccine. Advised may receive this vaccine at local pharmacy or Health Dept. Aware to provide a copy of the vaccination record if obtained from local pharmacy or Health Dept. Verbalized acceptance and understanding.  Pneumococcal vaccine status: Declined,  Education has been provided regarding the  importance of this vaccine but patient still declined. Advised may receive this vaccine at local pharmacy or Health Dept. Aware to provide a copy of the vaccination record if obtained from local pharmacy or Health Dept. Verbalized acceptance and understanding.   Covid-19 vaccine status: Information provided on how to obtain vaccines.   Qualifies for Shingles Vaccine? Yes   Zostavax completed No   Shingrix Completed?: No.    Education has been provided regarding the importance of this vaccine. Patient has been advised to call insurance company to determine out of pocket expense if they have not yet received this vaccine. Advised may also receive vaccine at local pharmacy or Health Dept. Verbalized acceptance and understanding.  Screening Tests Health Maintenance  Topic Date Due   COVID-19 Vaccine (3 - 2023-24 season) 11/06/2021   OPHTHALMOLOGY EXAM  05/22/2022   INFLUENZA VACCINE  10/07/2022   FOOT EXAM  10/21/2022   Zoster Vaccines- Shingrix (1 of 2) 11/05/2022 (Originally 10/18/1964)   Pneumonia Vaccine 61+ Years old (1 of 1 - PCV) 05/05/2023 (Originally 10/17/2009)   HEMOGLOBIN A1C  02/05/2023   Diabetic kidney evaluation - Urine ACR  05/05/2023   Diabetic kidney evaluation - eGFR measurement  08/05/2023   Medicare Annual Wellness (AWV)  10/21/2023   DTaP/Tdap/Td (2 - Td or Tdap) 11/05/2027   DEXA SCAN  Completed   Hepatitis C Screening  Completed   HPV VACCINES  Aged Out   Colonoscopy  Discontinued    Health Maintenance  Health Maintenance Due  Topic Date Due   COVID-19 Vaccine (3 - 2023-24 season) 11/06/2021   OPHTHALMOLOGY EXAM  05/22/2022   INFLUENZA VACCINE  10/07/2022   FOOT EXAM  10/21/2022    Colorectal cancer screening: No longer required.   Mammogram status: Completed 07/12/2022. Repeat every year  Bone Density status: Completed 01/07/2017.   Lung Cancer Screening: (Low Dose CT Chest recommended if Age 64-80 years, 20 pack-year currently smoking OR have quit w/in  15years.) does not qualify.   Lung Cancer Screening Referral: no  Additional Screening:  Hepatitis C Screening: does qualify; Completed 04/05/2019  Vision Screening: Recommended annual ophthalmology exams for early detection of glaucoma and other disorders of the eye. Is the patient up to date with their annual eye exam?  No  Who is the provider or what is the name of the office in which the patient attends annual eye exams? Dr. Randon Goldsmith If pt is not established with a provider, would they like to be referred to a provider to establish care? No .   Dental Screening: Recommended annual dental exams for proper oral hygiene  Diabetic Foot Exam: Diabetic Foot Exam: Completed 10/20/2021  Community Resource Referral / Chronic Care Management: CRR required this visit?  No   CCM required this visit?  No     Plan:     I have personally reviewed and noted the following in the patient's chart:   Medical and social history Use of alcohol, tobacco or illicit drugs  Current medications and supplements including opioid prescriptions. Patient is not currently taking opioid prescriptions. Functional ability and status Nutritional status Physical activity Advanced directives List of other physicians Hospitalizations, surgeries, and ER visits in previous 12 months Vitals Screenings to include cognitive, depression, and falls Referrals and appointments  In addition, I have reviewed and discussed with patient certain preventive protocols, quality metrics, and best practice recommendations. A written personalized care plan for preventive services as well as general preventive health recommendations were provided to patient.     Barb Merino, LPN   1/61/0960   After Visit Summary: in person  Nurse Notes: none

## 2022-10-21 NOTE — Progress Notes (Signed)
I,Victoria T Deloria Lair, CMA,acting as a Neurosurgeon for [name], MD.,have documented all relevant documentation on the behalf of [name], MD,as directed by  [name], MD while in the presence of [name], MD.  Subjective:  Patient ID: Dawn Wood , female    DOB: 07/12/1945 , 77 y.o.   MRN: [id number]  Chief Complaint  Patient presents with   Diabetes   Hypertension    HPI  Patient presents today for a HTN/DM check. She reports compliance with meds. She reports wanting her thyroid checked. She states she has been feeling bad overall. She admits feeling very fatigued.     Diabetes She presents for her follow-up diabetic visit. She has type 2 diabetes mellitus. Her disease course has been stable. There are no hypoglycemic associated symptoms. Associated symptoms include fatigue. Pertinent negatives for diabetes include no blurred vision, no polydipsia, no polyphagia and no polyuria. There are no hypoglycemic complications. Diabetic complications include nephropathy. Risk factors for coronary artery disease include diabetes mellitus, dyslipidemia, hypertension, obesity, sedentary lifestyle and post-menopausal. Current diabetic treatment includes insulin injections. She is following a diabetic diet. She participates in exercise intermittently. Her home blood glucose trend is fluctuating minimally. Her breakfast blood glucose is taken between 8-9 am. Her breakfast blood glucose range is generally 110-130 mg/dl. Eye exam is current.  Hypertension This is a chronic problem. The current episode started more than 1 year ago. The problem has been gradually improving since onset. Associated symptoms include shortness of breath. Pertinent negatives include no blurred vision. Risk factors for coronary artery disease include diabetes mellitus, dyslipidemia, sedentary lifestyle and post-menopausal state. Hypertensive end-organ damage includes kidney disease.     Past Medical  History:  Diagnosis Date   Coronary artery disease    Diabetes mellitus without complication (HCC)    GERD (gastroesophageal reflux disease)    Hypertension    MI (myocardial infarction) (HCC)    Peripheral vascular disease (HCC)      Family History  Problem Relation Age of Onset   Hypertension Mother    Cancer Father    Hypertension Father    Breast cancer Sister 87   Aortic stenosis Daughter    Heart disease Daughter        before age 39   Thyroid disease Daughter    Sleep apnea Neg Hx      Current Outpatient Medications:    Accu-Chek Softclix Lancets lancets, USE TO CHECK BLOOD SUGARS ONCE DAILY., Disp: 100 each, Rfl: 12   apixaban (ELIQUIS) 5 MG TABS tablet, Take 1 tablet (5 mg total) by mouth 2 (two) times daily., Disp: 180 tablet, Rfl: 1   BD PEN NEEDLE NANO 2ND GEN 32G X 4 MM MISC, USE AS DIRECTED TWICE DAILY, Disp: 100 each, Rfl: 2   Blood Glucose Monitoring Suppl (ACCU-CHEK GUIDE) w/Device KIT, USE TO CHECK BLOOD SUGARS ONCE DAILY., Disp: 1 kit, Rfl: 0   chlorthalidone (HYGROTON) 25 MG tablet, Take 1 tablet (25 mg total) by mouth daily., Disp: 90 tablet, Rfl: 2   Cholecalciferol (VITAMIN D3) 2000 units TABS, Take 1 tablet by mouth daily., Disp: , Rfl:    colchicine 0.6 MG tablet, Take 1 tablet (0.6 mg total) by mouth 2 (two) times daily., Disp: 180 tablet, Rfl: 3   dorzolamide-timolol (COSOPT) 2-0.5 % ophthalmic solution, Place 1 drop into the right eye 2 (two) times daily., Disp: , Rfl:    Evolocumab (REPATHA SURECLICK) 140 MG/ML SOAJ, Inject 140 mg into the  skin every 14 (fourteen) days., Disp: 6 mL, Rfl: 1   fluticasone (FLONASE) 50 MCG/ACT nasal spray, SHAKE LIQUID AND USE 1 SPRAY IN EACH NOSTRIL DAILY AS NEEDED FOR ALLERGIES OR RHINITIS, Disp: 16 g, Rfl: 2   glucose blood (ACCU-CHEK GUIDE) test strip, USE TO CHECK BLOOD SUGARS ONCE DAILY., Disp: 100 each, Rfl: 12   insulin aspart protamine - aspart (NOVOLOG MIX 70/30 FLEXPEN) (70-30) 100 UNIT/ML FlexPen, INJECT 40  UNITS SUBCUTANEOUS EVERY MORNING AND 50 UNITS SUBCUTANEOUS AT DINNER, Disp: 75 mL, Rfl: 2   ketorolac (ACULAR) 0.5 % ophthalmic solution, Place 1 drop into the right eye 3 (three) times daily., Disp: , Rfl:    nitroGLYCERIN (NITROSTAT) 0.4 MG SL tablet, Place 1 tablet (0.4 mg total) under the tongue every 5 (five) minutes as needed for chest pain., Disp: 25 tablet, Rfl: 11   olmesartan (BENICAR) 40 MG tablet, TAKE 1 TABLET(40 MG) BY MOUTH DAILY, Disp: 90 tablet, Rfl: 2   ONETOUCH VERIO test strip, USE TO CHECK BLOOD SUGARS TWICE DAILY AS DIRECTED, Disp: 100 strip, Rfl: 3   Semaglutide,0.25 or 0.5MG /DOS, (OZEMPIC, 0.25 OR 0.5 MG/DOSE,) 2 MG/1.5ML SOPN, INJECT 0.5MG  INTO SKIN ONCE A WEEK, Disp: 4.5 mL, Rfl: 3   traMADol (ULTRAM) 50 MG tablet, Take 50 mg by mouth daily at 6 (six) AM., Disp: , Rfl:    VYZULTA 0.024 % SOLN, Apply 1 drop to eye in the morning, at noon, and at bedtime., Disp: , Rfl:    furosemide (LASIX) 20 MG tablet, Take 1 tablet by mouth daily X's 3 days then reduce to 1 tablet by mouth only as needed for edema / swelling, Disp: 30 tablet, Rfl: 0   isosorbide mononitrate (IMDUR) 30 MG 24 hr tablet, Take 1/2-1 tablet daily, Disp: 90 tablet, Rfl: 0   metoprolol succinate (TOPROL-XL) 100 MG 24 hr tablet, Take 1.5 tablets (150 mg total) by mouth daily. Take with or immediately following a meal., Disp: 135 tablet, Rfl: 3   sodium chloride (MURO 128) 5 % ophthalmic solution, Place 1 drop into the right eye 2 (two) times daily., Disp: , Rfl:    spironolactone (ALDACTONE) 25 MG tablet, Take 1 tablet (25 mg total) by mouth daily., Disp: 90 tablet, Rfl: 2  Current Facility-Administered Medications:    nitroGLYCERIN (NITROSTAT) SL tablet 0.4 mg, 0.4 mg, Sublingual, Q5 min PRN, Conte, Tessa N, PA-C   Allergies  Allergen Reactions   Dust Mite Extract Other (See Comments)    REACTION: SNEEZING   Other Other (See Comments)    ALLERGEN: DEODORIZERS SPRAY AND PERFUMES REACTION: SNEEZING   Statins  Other (See Comments)    Muscle aches with rosuvastatin 5 mg daily and 3x/weekly, atorvastatin 20 mg daily and 3x/week, simvastatin 40 mg daily    Allopurinol     Diarrhea and the pt said it made her sugars low   Pollen Extract Other (See Comments)    Reaction unknown     Review of Systems  Constitutional:  Positive for fatigue.  Eyes:  Negative for blurred vision.  Respiratory:  Positive for shortness of breath.   Cardiovascular: Negative.   Endocrine: Negative for polydipsia, polyphagia and polyuria.  Neurological: Negative.   Psychiatric/Behavioral: Negative.       Today's Vitals   10/21/22 1135  BP: 128/70  Pulse: 69  Temp: 98.5 F (36.9 C)  SpO2: 98%  Weight: 211 lb (95.7 kg)  Height: 5\' 5"  (1.651 m)   Body mass index is 35.11 kg/m.  Wt Readings  from Last 3 Encounters:  10/25/22 213 lb (96.6 kg)  10/21/22 211 lb (95.7 kg)  10/21/22 211 lb (95.7 kg)     Objective:  Physical Exam Vitals and nursing note reviewed.  Constitutional:      Appearance: Normal appearance. She is obese.  HENT:     Head: Normocephalic and atraumatic.  Eyes:     Extraocular Movements: Extraocular movements intact.  Cardiovascular:     Rate and Rhythm: Normal rate and regular rhythm.     Heart sounds: Normal heart sounds.  Pulmonary:     Effort: Pulmonary effort is normal.     Breath sounds: Normal breath sounds.  Musculoskeletal:     Cervical back: Normal range of motion.     Right lower leg: Edema present.     Left lower leg: Edema present.  Skin:    General: Skin is warm.  Neurological:     General: No focal deficit present.     Mental Status: She is alert.  Psychiatric:        Mood and Affect: Mood normal.        Behavior: Behavior normal.         Assessment And Plan:  Type 2 diabetes mellitus with stage 3a chronic kidney disease, with long-term current use of insulin (HCC) Assessment & Plan: Chronic, she will continue with Novolog 70/30 40 units sq qam and 50 units  sq qpm and semaglutide 0.5mg  weekly. She is reminded to optimize her protein intake while on Ozempic. She will f/u in three to four months for re-evaluation. Importance of dietary compliance was discussed with the patient.   Orders: -     Hemoglobin A1c -     CMP14+EGFR  Hypertensive heart and renal disease with renal failure, stage 1 through stage 4 or unspecified chronic kidney disease, without heart failure Assessment & Plan: Chronic, fair control. Goal BP<120/80. She is reminded to follow a low sodium diet. She will continue with spironolactone 25mg  daily, metoprolol XL 100mg  daily, olmesartan 40mg  daily and chlorthalidone 25mg  daily.   Orders: -     CMP14+EGFR  CAD in native artery Assessment & Plan: She is s/p stent placement in 2015. She is complaining of worsening fatigue, may need further Cardiac evaluation. She is on Eliquis, metoprolol XL  100mg  daily, NTG prn, and Imdur daily. She is encouraged to follow heart healthy lifestyle.    Thyroid enlargement Assessment & Plan: July 2024 thyroid ultrasound results reviewed, nodules are present but none require biopsy.   Orders: -     TSH + free T4  Shortness of breath Assessment & Plan: She appears to be fluid overloaded. I will check labs as below. I would like to review renal function prior to starting furosemide. She is encouraged to decrease her sodium intake and to avoid processed meats.   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 Advised to aim for at least 150 minutes of exercise per week.    She is encouraged to strive for BMI less than 30 to decrease cardiac risk. Advised to aim for at least 150 minutes of exercise per week.    Return in 3 months (on 01/21/2023), or dm check, for 6  months -  physical please (I didn't see old one).  Patient was given opportunity to ask questions. Patient verbalized understanding of the plan and was able to repeat key elements of the plan. All questions were answered to their satisfaction.    I, [name], MD, have reviewed all documentation for this visit. The documentation on 10/21/22 for the exam, diagnosis, procedures, and orders are all accurate and complete.   IF YOU HAVE BEEN REFERRED TO A SPECIALIST, IT MAY TAKE 1-2 WEEKS TO SCHEDULE/PROCESS THE REFERRAL. IF YOU HAVE NOT HEARD FROM US/SPECIALIST IN TWO WEEKS, PLEASE GIVE [place] A CALL AT [phone].   THE PATIENT IS ENCOURAGED TO PRACTICE SOCIAL DISTANCING DUE TO THE COVID-19 PANDEMIC.

## 2022-10-21 NOTE — Patient Instructions (Signed)

## 2022-10-21 NOTE — Patient Instructions (Signed)
Dawn Wood , Thank you for taking time to come for your Medicare Wellness Visit. I appreciate your ongoing commitment to your health goals. Please review the following plan we discussed and let me know if I can assist you in the future.   Referrals/Orders/Follow-Ups/Clinician Recommendations: none   This is a list of the screening recommended for you and due dates:  Health Maintenance  Topic Date Due   COVID-19 Vaccine (3 - 2023-24 season) 11/06/2021   Eye exam for diabetics  05/22/2022   Flu Shot  10/07/2022   Complete foot exam   10/21/2022   Zoster (Shingles) Vaccine (1 of 2) 11/05/2022*   Pneumonia Vaccine (1 of 1 - PCV) 05/05/2023*   Hemoglobin A1C  02/05/2023   Yearly kidney health urinalysis for diabetes  05/05/2023   Yearly kidney function blood test for diabetes  08/05/2023   Medicare Annual Wellness Visit  10/21/2023   DTaP/Tdap/Td vaccine (2 - Td or Tdap) 11/05/2027   DEXA scan (bone density measurement)  Completed   Hepatitis C Screening  Completed   HPV Vaccine  Aged Out   Colon Cancer Screening  Discontinued  *Topic was postponed. The date shown is not the original due date.    Advanced directives: (Declined) Advance directive discussed with you today. Even though you declined this today, please call our office should you change your mind, and we can give you the proper paperwork for you to fill out.  Next Medicare Annual Wellness Visit scheduled for next year: No, office will schedule appointment  Preventive Care 65 Years and Older, Female Preventive care refers to lifestyle choices and visits with your health care provider that can promote health and wellness. What does preventive care include? A yearly physical exam. This is also called an annual well check. Dental exams once or twice a year. Routine eye exams. Ask your health care provider how often you should have your eyes checked. Personal lifestyle choices, including: Daily care of your teeth and  gums. Regular physical activity. Eating a healthy diet. Avoiding tobacco and drug use. Limiting alcohol use. Practicing safe sex. Taking low-dose aspirin every day. Taking vitamin and mineral supplements as recommended by your health care provider. What happens during an annual well check? The services and screenings done by your health care provider during your annual well check will depend on your age, overall health, lifestyle risk factors, and family history of disease. Counseling  Your health care provider may ask you questions about your: Alcohol use. Tobacco use. Drug use. Emotional well-being. Home and relationship well-being. Sexual activity. Eating habits. History of falls. Memory and ability to understand (cognition). Work and work Astronomer. Reproductive health. Screening  You may have the following tests or measurements: Height, weight, and BMI. Blood pressure. Lipid and cholesterol levels. These may be checked every 5 years, or more frequently if you are over 86 years old. Skin check. Lung cancer screening. You may have this screening every year starting at age 49 if you have a 30-pack-year history of smoking and currently smoke or have quit within the past 15 years. Fecal occult blood test (FOBT) of the stool. You may have this test every year starting at age 42. Flexible sigmoidoscopy or colonoscopy. You may have a sigmoidoscopy every 5 years or a colonoscopy every 10 years starting at age 80. Hepatitis C blood test. Hepatitis B blood test. Sexually transmitted disease (STD) testing. Diabetes screening. This is done by checking your blood sugar (glucose) after you have not eaten for  a while (fasting). You may have this done every 1-3 years. Bone density scan. This is done to screen for osteoporosis. You may have this done starting at age 41. Mammogram. This may be done every 1-2 years. Talk to your health care provider about how often you should have regular  mammograms. Talk with your health care provider about your test results, treatment options, and if necessary, the need for more tests. Vaccines  Your health care provider may recommend certain vaccines, such as: Influenza vaccine. This is recommended every year. Tetanus, diphtheria, and acellular pertussis (Tdap, Td) vaccine. You may need a Td booster every 10 years. Zoster vaccine. You may need this after age 67. Pneumococcal 13-valent conjugate (PCV13) vaccine. One dose is recommended after age 31. Pneumococcal polysaccharide (PPSV23) vaccine. One dose is recommended after age 47. Talk to your health care provider about which screenings and vaccines you need and how often you need them. This information is not intended to replace advice given to you by your health care provider. Make sure you discuss any questions you have with your health care provider. Document Released: 03/21/2015 Document Revised: 11/12/2015 Document Reviewed: 12/24/2014 Elsevier Interactive Patient Education  2017 [employer].  Fall Prevention in the Home Falls can cause injuries. They can happen to people of all ages. There are many things you can do to make your home safe and to help prevent falls. What can I do on the outside of my home? Regularly fix the edges of walkways and driveways and fix any cracks. Remove anything that might make you trip as you walk through a door, such as a raised step or threshold. Trim any bushes or trees on the path to your home. Use bright outdoor lighting. Clear any walking paths of anything that might make someone trip, such as rocks or tools. Regularly check to see if handrails are loose or broken. Make sure that both sides of any steps have handrails. Any raised decks and porches should have guardrails on the edges. Have any leaves, snow, or ice cleared regularly. Use sand or salt on walking paths during winter. Clean up any spills in your garage right away. This includes oil  or grease spills. What can I do in the bathroom? Use night lights. Install grab bars by the toilet and in the tub and shower. Do not use towel bars as grab bars. Use non-skid mats or decals in the tub or shower. If you need to sit down in the shower, use a plastic, non-slip stool. Keep the floor dry. Clean up any water that spills on the floor as soon as it happens. Remove soap buildup in the tub or shower regularly. Attach bath mats securely with double-sided non-slip rug tape. Do not have throw rugs and other things on the floor that can make you trip. What can I do in the bedroom? Use night lights. Make sure that you have a light by your bed that is easy to reach. Do not use any sheets or blankets that are too big for your bed. They should not hang down onto the floor. Have a firm chair that has side arms. You can use this for support while you get dressed. Do not have throw rugs and other things on the floor that can make you trip. What can I do in the kitchen? Clean up any spills right away. Avoid walking on wet floors. Keep items that you use a lot in easy-to-reach places. If you need to reach something above  you, use a strong step stool that has a grab bar. Keep electrical cords out of the way. Do not use floor polish or wax that makes floors slippery. If you must use wax, use non-skid floor wax. Do not have throw rugs and other things on the floor that can make you trip. What can I do with my stairs? Do not leave any items on the stairs. Make sure that there are handrails on both sides of the stairs and use them. Fix handrails that are broken or loose. Make sure that handrails are as long as the stairways. Check any carpeting to make sure that it is firmly attached to the stairs. Fix any carpet that is loose or worn. Avoid having throw rugs at the top or bottom of the stairs. If you do have throw rugs, attach them to the floor with carpet tape. Make sure that you have a light  switch at the top of the stairs and the bottom of the stairs. If you do not have them, ask someone to add them for you. What else can I do to help prevent falls? Wear shoes that: Do not have high heels. Have rubber bottoms. Are comfortable and fit you well. Are closed at the toe. Do not wear sandals. If you use a stepladder: Make sure that it is fully opened. Do not climb a closed stepladder. Make sure that both sides of the stepladder are locked into place. Ask someone to hold it for you, if possible. Clearly mark and make sure that you can see: Any grab bars or handrails. First and last steps. Where the edge of each step is. Use tools that help you move around (mobility aids) if they are needed. These include: Canes. Walkers. Scooters. Crutches. Turn on the lights when you go into a dark area. Replace any light bulbs as soon as they burn out. Set up your furniture so you have a clear path. Avoid moving your furniture around. If any of your floors are uneven, fix them. If there are any pets around you, be aware of where they are. Review your medicines with your doctor. Some medicines can make you feel dizzy. This can increase your chance of falling. Ask your doctor what other things that you can do to help prevent falls. This information is not intended to replace advice given to you by your health care provider. Make sure you discuss any questions you have with your health care provider. Document Released: 12/19/2008 Document Revised: 07/31/2015 Document Reviewed: 03/29/2014 Elsevier Interactive Patient Education  2017 [employer].

## 2022-10-22 ENCOUNTER — Other Ambulatory Visit: Payer: Self-pay

## 2022-10-22 DIAGNOSIS — Z79899 Other long term (current) drug therapy: Secondary | ICD-10-CM

## 2022-10-22 DIAGNOSIS — I10 Essential (primary) hypertension: Secondary | ICD-10-CM

## 2022-10-22 DIAGNOSIS — R600 Localized edema: Secondary | ICD-10-CM

## 2022-10-22 LAB — CMP14+EGFR
ALT: 14 IU/L (ref 0–32)
AST: 11 IU/L (ref 0–40)
Albumin: 3.9 g/dL (ref 3.8–4.8)
Alkaline Phosphatase: 81 IU/L (ref 44–121)
BUN/Creatinine Ratio: 20 (ref 12–28)
BUN: 24 mg/dL (ref 8–27)
Bilirubin Total: 0.6 mg/dL (ref 0.0–1.2)
CO2: 22 mmol/L (ref 20–29)
Calcium: 9.5 mg/dL (ref 8.7–10.3)
Chloride: 102 mmol/L (ref 96–106)
Creatinine, Ser: 1.18 mg/dL — ABNORMAL HIGH (ref 0.57–1.00)
Globulin, Total: 3 g/dL (ref 1.5–4.5)
Glucose: 95 mg/dL (ref 70–99)
Potassium: 4.3 mmol/L (ref 3.5–5.2)
Sodium: 139 mmol/L (ref 134–144)
Total Protein: 6.9 g/dL (ref 6.0–8.5)
eGFR: 47 mL/min/{1.73_m2} — ABNORMAL LOW (ref >59)

## 2022-10-22 LAB — CBC
Hematocrit: 39.9 % (ref 34.0–46.6)
Hemoglobin: 13 g/dL (ref 11.1–15.9)
MCH: 28.6 pg (ref 26.6–33.0)
MCHC: 32.6 g/dL (ref 31.5–35.7)
MCV: 88 fL (ref 79–97)
Platelets: 247 10*3/uL (ref 150–450)
RBC: 4.54 x10E6/uL (ref 3.77–5.28)
RDW: 12.9 % (ref 11.7–15.4)
WBC: 7.4 10*3/uL (ref 3.4–10.8)

## 2022-10-22 LAB — TSH+FREE T4
Free T4: 1.3 ng/dL (ref 0.82–1.77)
TSH: 0.999 u[IU]/mL (ref 0.450–4.500)

## 2022-10-22 LAB — HEMOGLOBIN A1C
Est. average glucose Bld gHb Est-mCnc: 151 mg/dL
Hgb A1c MFr Bld: 6.9 % — ABNORMAL HIGH (ref 4.8–5.6)

## 2022-10-22 LAB — BRAIN NATRIURETIC PEPTIDE: BNP: 88.3 pg/mL (ref 0.0–100.0)

## 2022-10-22 MED ORDER — METOPROLOL SUCCINATE ER 100 MG PO TB24
100.0000 mg | ORAL_TABLET | Freq: Every day | ORAL | 2 refills | Status: DC
Start: 2022-10-22 — End: 2022-10-25

## 2022-10-22 MED ORDER — SPIRONOLACTONE 25 MG PO TABS
25.0000 mg | ORAL_TABLET | Freq: Every day | ORAL | 2 refills | Status: DC
Start: 2022-10-22 — End: 2023-07-28

## 2022-10-22 NOTE — Telephone Encounter (Signed)
Pt's medications were sent to pt's pharmacy as requested. Confirmation received.  

## 2022-10-24 NOTE — Progress Notes (Unsigned)
Office Visit    Patient Name: Dawn Wood Date of Encounter: 10/24/2022  Primary Care Provider:  Dorothyann Peng, MD Primary Cardiologist:  None Primary Electrophysiologist: None   Past Medical History    Past Medical History:  Diagnosis Date   Coronary artery disease    Diabetes mellitus without complication (HCC)    GERD (gastroesophageal reflux disease)    Hypertension    MI (myocardial infarction) (HCC)    Peripheral vascular disease (HCC)    Past Surgical History:  Procedure Laterality Date   ABDOMINAL HYSTERECTOMY  1985   APPENDECTOMY     CATARACT EXTRACTION Left 02/20/2018   Dr. Harlon Flor   CATARACT EXTRACTION Bilateral 2024   cataract surgery Right 01/25/2022   Second at [facility] 11/29   CORONARY STENT PLACEMENT  2015   ectopic pregnancy--unilateral salpingectomy  1980   KNEE ARTHROSCOPY Left 03/2016   unilateral oophorectomy      Allergies  Allergies  Allergen Reactions   Dust Mite Extract Other (See Comments)    REACTION: SNEEZING   Other Other (See Comments)    ALLERGEN: DEODORIZERS SPRAY AND PERFUMES REACTION: SNEEZING   Statins Other (See Comments)    Muscle aches with rosuvastatin 5 mg daily and 3x/weekly, atorvastatin 20 mg daily and 3x/week, simvastatin 40 mg daily    Allopurinol     Diarrhea and the pt said it made her sugars low   Pollen Extract Other (See Comments)    Reaction unknown     History of Present Illness   Dawn Wood  is a 77 year old female with a PMH of CAD s/p NSTEMI treated with PCI/DES to LAD with 60% RCA and 40% LCx, HTN, HLD, PVD, DM type II, PAF (on Eliquis), arthritis who presents today for 36-month follow-up.   Dawn Wood was seen last on 07/26/2022 for follow-up and had continued complaints of occasional chest pain that occurred more frequently with emotional upset.  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  She was started on Imdur as needed for breakthrough chest  pain.  Blood pressure during visit was controlled and patient denied any palpitations.  She had new bruit auscultated on physical exam and underwent carotid Doppler that showed mild stenosis (1-39%).  Since last being seen in the office patient reports***.  Patient denies chest pain, palpitations, dyspnea, PND, orthopnea, nausea, vomiting, dizziness, syncope, edema, weight gain, or early satiety.     ***Notes:  Home Medications    Current Outpatient Medications  Medication Sig Dispense Refill   Accu-Chek Softclix Lancets lancets USE TO CHECK BLOOD SUGARS ONCE DAILY. 100 each 12   apixaban (ELIQUIS) 5 MG TABS tablet Take 1 tablet (5 mg total) by mouth 2 (two) times daily. 180 tablet 1   BD PEN NEEDLE NANO 2ND GEN 32G X 4 MM MISC USE AS DIRECTED TWICE DAILY 100 each 2   Blood Glucose Monitoring Suppl (ACCU-CHEK GUIDE) w/Device KIT USE TO CHECK BLOOD SUGARS ONCE DAILY. 1 kit 0   chlorthalidone (HYGROTON) 25 MG tablet Take 1 tablet (25 mg total) by mouth daily. 90 tablet 2   Cholecalciferol (VITAMIN D3) 2000 units TABS Take 1 tablet by mouth daily.     Coenzyme Q10 (CO Q 10) 10 MG CAPS Take by mouth daily. (Patient not taking: Reported on 10/21/2022)     colchicine 0.6 MG tablet Take 1 tablet (0.6 mg total) by mouth 2 (two) times daily. 180 tablet 3   dorzolamide-timolol (COSOPT)  2-0.5 % ophthalmic solution Place 1 drop into the right eye 2 (two) times daily.     Evolocumab (REPATHA SURECLICK) 140 MG/ML SOAJ Inject 140 mg into the skin every 14 (fourteen) days. 6 mL 1   famotidine (PEPCID) 20 MG tablet TAKE 1 TABLET(20 MG) BY MOUTH TWICE DAILY (Patient not taking: Reported on 10/21/2022) 180 tablet 1   fluticasone (FLONASE) 50 MCG/ACT nasal spray SHAKE LIQUID AND USE 1 SPRAY IN EACH NOSTRIL DAILY AS NEEDED FOR ALLERGIES OR RHINITIS 16 g 2   gabapentin (NEURONTIN) 100 MG capsule One tab po qhs x 1 week, then 2 tabs po qhs (Patient not taking: Reported on 10/21/2022) 180 capsule 0   glucose blood  (ACCU-CHEK GUIDE) test strip USE TO CHECK BLOOD SUGARS ONCE DAILY. 100 each 12   insulin aspart protamine - aspart (NOVOLOG MIX 70/30 FLEXPEN) (70-30) 100 UNIT/ML FlexPen INJECT 40 UNITS SUBCUTANEOUS EVERY MORNING AND 50 UNITS SUBCUTANEOUS AT DINNER 75 mL 2   isosorbide mononitrate (IMDUR) 30 MG 24 hr tablet Take 0.5-1 tablets (15-30 mg total) by mouth daily. 30 tablet 3   ketorolac (ACULAR) 0.5 % ophthalmic solution Place 1 drop into the right eye 3 (three) times daily.     lidocaine-prilocaine (EMLA) cream      metoprolol succinate (TOPROL-XL) 100 MG 24 hr tablet Take 1 tablet (100 mg total) by mouth daily. Take with or immediately following a meal. 90 tablet 2   nitroGLYCERIN (NITROSTAT) 0.4 MG SL tablet Place 1 tablet (0.4 mg total) under the tongue every 5 (five) minutes as needed for chest pain. 25 tablet 11   olmesartan (BENICAR) 40 MG tablet TAKE 1 TABLET(40 MG) BY MOUTH DAILY 90 tablet 2   ONETOUCH VERIO test strip USE TO CHECK BLOOD SUGARS TWICE DAILY AS DIRECTED 100 strip 3   Semaglutide,0.25 or 0.5MG /DOS, (OZEMPIC, 0.25 OR 0.5 MG/DOSE,) 2 MG/1.5ML SOPN INJECT 0.5MG  INTO SKIN ONCE A WEEK 4.5 mL 3   spironolactone (ALDACTONE) 25 MG tablet Take 1 tablet (25 mg total) by mouth daily. 90 tablet 2   traMADol (ULTRAM) 50 MG tablet Take 50 mg by mouth daily at 6 (six) AM.     VYZULTA 0.024 % SOLN Apply 1 drop to eye in the morning, at noon, and at bedtime.     Current Facility-Administered Medications  Medication Dose Route Frequency Provider Last Rate Last Admin   nitroGLYCERIN (NITROSTAT) SL tablet 0.4 mg  0.4 mg Sublingual Q5 min PRN Sharlene Dory, PA-C         Review of Systems  Please see the history of present illness.    (+)*** (+)***  All other systems reviewed and are otherwise negative except as noted above.  Physical Exam    Wt Readings from Last 3 Encounters:  10/21/22 211 lb (95.7 kg)  10/21/22 211 lb (95.7 kg)  08/26/22 197 lb 12.8 oz (89.7 kg)   ZO:XWRUE were no  vitals filed for this visit.,There is no height or weight on file to calculate BMI.  Constitutional:      Appearance: Healthy appearance. Not in distress.  Neck:     Vascular: JVD normal.  Pulmonary:     Effort: Pulmonary effort is normal.     Breath sounds: No wheezing. No rales. Diminished in the bases Cardiovascular:     Normal rate. Regular rhythm. Normal S1. Normal S2.      Murmurs: There is no murmur.  Edema:    Peripheral edema absent.  Abdominal:  Palpations: Abdomen is soft non tender. There is no hepatomegaly.  Skin:    General: Skin is warm and dry.  Neurological:     General: No focal deficit present.     Mental Status: Alert and oriented to person, place and time.     Cranial Nerves: Cranial nerves are intact.  EKG/LABS/ Recent Cardiac Studies    ECG personally reviewed by me today - ***   Risk Assessment/Calculations:   {Does this patient have ATRIAL FIBRILLATION?:[id number]}        Lab Results  Component Value Date   WBC 7.4 10/21/2022   HGB 13.0 10/21/2022   HCT 39.9 10/21/2022   MCV 88 10/21/2022   PLT 247 10/21/2022   Lab Results  Component Value Date   CREATININE 1.18 (H) 10/21/2022   BUN 24 10/21/2022   NA 139 10/21/2022   K 4.3 10/21/2022   CL 102 10/21/2022   CO2 22 10/21/2022   Lab Results  Component Value Date   ALT 14 10/21/2022   AST 11 10/21/2022   GGT 71 (H) 04/12/2019   ALKPHOS 81 10/21/2022   BILITOT 0.6 10/21/2022   Lab Results  Component Value Date   CHOL 195 05/04/2022   HDL 49 05/04/2022   LDLCALC 121 (H) 05/04/2022   LDLDIRECT 83 06/11/2019   TRIG 140 05/04/2022   CHOLHDL 4.0 05/04/2022    Lab Results  Component Value Date   HGBA1C 6.9 (H) 10/21/2022     Assessment & Plan    1.History of CAD: -s/p NSTEMI 2015 treated with DES to LAD with nonobstructive CAD in RCA and circumflex.  Patient had last ischemic evaluation by [facility] in 2023 that was low risk and normal.  2.Paroxysmal AF: -Continue  rate control with Toprol-XL 100 mg daily -Patient's last hemoglobin was 14 and creatinine was 1.1 -Continue Eliquis 5 mg twice daily  3.  Essential hypertension:  4.Obstructive sleep apnea: -Sleep study completed and patient currently on CPAP with AutoPap  5.  Lower extremity edema      Disposition: Follow-up with None or APP in *** months {Are you ordering a CV Procedure (e.g. stress test, cath, DCCV, TEE, etc)?   Press F2        :[id number]}   Medication Adjustments/Labs and Tests Ordered: Current medicines are reviewed at length with the patient today.  Concerns regarding medicines are outlined above.   Signed, Napoleon Form, Leodis Rains, NP 10/24/2022, 3:40 PM [facility]

## 2022-10-25 ENCOUNTER — Encounter: Payer: Self-pay | Admitting: Nurse Practitioner

## 2022-10-25 ENCOUNTER — Ambulatory Visit: Payer: Medicare PPO | Admitting: Neurology

## 2022-10-25 ENCOUNTER — Ambulatory Visit: Payer: Medicare PPO | Attending: Nurse Practitioner | Admitting: Nurse Practitioner

## 2022-10-25 VITALS — BP 138/72 | HR 57 | Ht 65.0 in | Wt 213.0 lb

## 2022-10-25 DIAGNOSIS — G4739 Other sleep apnea: Secondary | ICD-10-CM

## 2022-10-25 DIAGNOSIS — E1122 Type 2 diabetes mellitus with diabetic chronic kidney disease: Secondary | ICD-10-CM | POA: Diagnosis not present

## 2022-10-25 DIAGNOSIS — R600 Localized edema: Secondary | ICD-10-CM

## 2022-10-25 DIAGNOSIS — R079 Chest pain, unspecified: Secondary | ICD-10-CM

## 2022-10-25 DIAGNOSIS — Z794 Long term (current) use of insulin: Secondary | ICD-10-CM | POA: Diagnosis not present

## 2022-10-25 DIAGNOSIS — G4733 Obstructive sleep apnea (adult) (pediatric): Secondary | ICD-10-CM

## 2022-10-25 DIAGNOSIS — I2510 Atherosclerotic heart disease of native coronary artery without angina pectoris: Secondary | ICD-10-CM | POA: Diagnosis not present

## 2022-10-25 DIAGNOSIS — R9431 Abnormal electrocardiogram [ECG] [EKG]: Secondary | ICD-10-CM

## 2022-10-25 DIAGNOSIS — G4720 Circadian rhythm sleep disorder, unspecified type: Secondary | ICD-10-CM

## 2022-10-25 DIAGNOSIS — I10 Essential (primary) hypertension: Secondary | ICD-10-CM

## 2022-10-25 DIAGNOSIS — I480 Paroxysmal atrial fibrillation: Secondary | ICD-10-CM | POA: Diagnosis not present

## 2022-10-25 DIAGNOSIS — Z789 Other specified health status: Secondary | ICD-10-CM

## 2022-10-25 DIAGNOSIS — N1831 Chronic kidney disease, stage 3a: Secondary | ICD-10-CM

## 2022-10-25 DIAGNOSIS — G478 Other sleep disorders: Secondary | ICD-10-CM

## 2022-10-25 MED ORDER — ISOSORBIDE MONONITRATE ER 30 MG PO TB24: ORAL_TABLET | ORAL | 0 refills | Status: DC

## 2022-10-25 MED ORDER — METOPROLOL SUCCINATE ER 100 MG PO TB24: 150.0000 mg | ORAL_TABLET | Freq: Every day | ORAL | 3 refills | Status: DC

## 2022-10-25 MED ORDER — FUROSEMIDE 20 MG PO TABS: ORAL_TABLET | ORAL | 0 refills | Status: DC

## 2022-10-25 NOTE — Patient Instructions (Addendum)
Medication Instructions:  Your physician has recommended you make the following change in your medication:   START Isosorbide 30 mg taking 1/2-1 tablet daily  INCREASE the Metoprolol to 100 mg taking 1 and 1/2 tablet daily   START Lasix 20 mg taking 1 tablet daily for 3 days then only as needed   *If you need a refill on your cardiac medications before your next appointment, please call your pharmacy*   Lab Work: 1 WEEK:  BMET  If you have labs (blood work) drawn today and your tests are completely normal, you will receive your results only by: MyChart Message (if you have MyChart) OR A paper copy in the mail If you have any lab test that is abnormal or we need to change your treatment, we will call you to review the results.   Testing/Procedures: None ordered   Your physician recommends that you schedule a follow-up appointment in: 6 MONTHS WITH A NEW CARDIOLOGIST AT [facility]  Follow-Up: At [facility], you and your health needs are our priority.  As part of our continuing mission to provide you with exceptional heart care, we have created designated Provider Care Teams.  These Care Teams include your primary Cardiologist (physician) and Advanced Practice Providers (APPs -  Physician Assistants and Nurse Practitioners) who all work together to provide you with the care you need, when you need it.  We recommend signing up for the patient portal called "MyChart".  Sign up information is provided on this After Visit Summary.  MyChart is used to connect with patients for Virtual Visits (Telemedicine).  Patients are able to view lab/test results, encounter notes, upcoming appointments, etc.  Non-urgent messages can be sent to your provider as well.   To learn more about what you can do with MyChart, go to [email or web address].    Your next appointment:   1 month(s)  Provider:   Robin Searing, NP         Other Instructions

## 2022-10-30 ENCOUNTER — Encounter: Payer: Self-pay | Admitting: Internal Medicine

## 2022-10-30 DIAGNOSIS — R0602 Shortness of breath: Secondary | ICD-10-CM | POA: Insufficient documentation

## 2022-10-30 NOTE — Assessment & Plan Note (Signed)
Chronic, she will continue with Novolog 70/30 40 units sq qam and 50 units sq qpm and semaglutide 0.5mg  weekly. She is reminded to optimize her protein intake while on Ozempic. She will f/u in three to four months for re-evaluation. Importance of dietary compliance was discussed with the patient.

## 2022-10-30 NOTE — Assessment & Plan Note (Signed)
Chronic, fair control. Goal BP<120/80. She is reminded to follow a low sodium diet. She will continue with spironolactone 25mg  daily, metoprolol XL 100mg  daily, olmesartan 40mg  daily and chlorthalidone 25mg  daily.

## 2022-10-30 NOTE — Assessment & Plan Note (Signed)
She appears to be fluid overloaded. I will check labs as below. I would like to review renal function prior to starting furosemide. She is encouraged to decrease her sodium intake and to avoid processed meats.

## 2022-10-30 NOTE — Assessment & Plan Note (Addendum)
She is s/p stent placement in 2015. She is complaining of worsening fatigue, may need further Cardiac evaluation. She is on Eliquis, metoprolol XL  100mg  daily, NTG prn, and Imdur daily. She is encouraged to follow heart healthy lifestyle.

## 2022-10-30 NOTE — Assessment & Plan Note (Signed)
She is encouraged to strive for BMI less than 30 to decrease cardiac risk. Advised to aim for at least 150 minutes of exercise per week.  

## 2022-10-30 NOTE — Assessment & Plan Note (Signed)
July 2024 thyroid ultrasound results reviewed, nodules are present but none require biopsy.

## 2022-11-01 NOTE — Addendum Note (Signed)
Addended by: Huston Foley on: 11/01/2022 06:43 PM   Modules accepted: Orders

## 2022-11-01 NOTE — Procedures (Signed)
Physician Interpretation:     [facility] PAP TITRATION INTERPRETATION REPORT   STUDY DATE: 10/25/2022      PATIENT NAME:  Dawn Wood         DATE OF BIRTH:  [birth year]  PATIENT ID:  [id number]    TYPE OF STUDY:  PSG  READING PHYSICIAN: Huston Foley, MD, PhD SCORING TECHNICIAN: Domingo Cocking, RPSGT   Referred by: Dorothyann Peng, MD  ? History and Indication for Testing: 77 year old right-handed woman with an underlying medical history of back pain, gout, coronary artery disease with history of MI, hypertension, peripheral vascular disease, reflux disease, diabetes and obesity, who presents for a full night titration study to optimize treatment settings and tolerance. Her home sleep test from 05/25/2022 showed a total AHI of 12.5/hour and O2 nadir of 88%. She started home AutoPap therapy with a pressure of 7 to 11 cm but has had trouble tolerating treatment. Height: 65.0 in Weight: 197 lb (BMI 32) Neck Size: 16.5 in    MEDICATIONS: Praluent, Norvasc, Vitamin D3, Plavix, Co-Q10, Colchicine, Flonase, Toprol-XL, Novolog Flexpen, Ozempic, Aldactazide, Dexilant, Antivert    DESCRIPTION: A sleep technologist was in attendance for the duration of the recording.  Data collection, scoring, video monitoring, and reporting were performed in compliance with the AASM Manual for the Scoring of Sleep and Associated Events; (Hypopnea is scored based on the criteria listed in Section VIII D. 1b in the AASM Manual V2.6 using a 4% oxygen desaturation rule or Hypopnea is scored based on the criteria listed in Section VIII D. 1a in the AASM Manual V2.6 using 3% oxygen desaturation and /or arousal rule).  A physician certified by the American Board of Sleep Medicine reviewed each epoch of the study.    SLEEP CONTINUITY AND SLEEP ARCHITECTURE:  Lights off was at 22:08: and lights on 04:54: (6.8 hours in bed). Total sleep time was 127.0 minutes (58.3% supine;  41.7% lateral;   0.0% prone, 0.0% REM sleep), with a decreased sleep efficiency at 31.2%. Sleep latency was normal at 10.5 minutes.  Of the total sleep time, the percentage of stage N1 sleep was 15.7%, which is markedly increased, stage N2 sleep was 84.3%, which is markedly increased, stage N3 sleep and REM sleep were absent.  Wake after sleep onset (WASO) time accounted for 269 minutes with significant sleep fragmentation.   AROUSAL: There were 21 arousals in total, for an arousal index of 9.9 arousals/hour.  Of these, 22 were identified as respiratory-related arousals (10.4 /h), 0 were PLM-related arousals (0.0 /h), and 11 were non-specific arousals (5.2 /h)  RESPIRATORY MONITORING:  Based on CMS criteria (using a 4% oxygen desaturation rule for scoring hypopneas), there were 21 apneas (0 obstructive; 16 central; 5 mixed), and 0 hypopneas.  Apnea index was 9.9. Hypopnea index was 0.0. The apnea-hypopnea index was 9.9 overall (12.2 supine, 0.0 non-supine; 0.0 REM, 0.0 supine REM). There were 0 respiratory effort-related arousals (RERAs).  The RERA index was 0.0 events/h. Total respiratory disturbance index (RDI) was 9.9 events/h. RDI results showed: supine RDI  12.2 /h; non-supine RDI 6.8 /h; REM RDI 0.0 /h, supine REM RDI 0.0 /h.   Based on AASM criteria (using a 3% oxygen desaturation and /or arousal rule for scoring hypopneas), there were 21 apneas (0 obstructive; 16 central; 5 mixed), and 16 hypopneas. Apnea index was 9.9. Hypopnea index was 7.6. The apnea-hypopnea index was 17.5 overall (24.3 supine, 0.0 non-supine; 0.0 REM, 0.0 supine REM). There were 0 respiratory effort-related  arousals (RERAs).  The RERA index was 0.0 events/h. Total respiratory disturbance index (RDI) was 17.5 events/h. RDI results showed: supine RDI  24.3 /h; non-supine RDI 7.9 /h; REM RDI 0.0 /h, supine REM RDI 0.0 /h.  Respiratory events were associated with oxyhemoglobin desaturations (nadir during sleep 95%) from a mean of 98%). There were 0  occurrences of Cheyne Stokes breathing.  OXIMETRY: Total sleep time spent at, or below 88% was 0.0 minutes, or 0.0% of total sleep time. Snoring was classified as .  BODY POSITION: Duration of total sleep and percent of total sleep in their respective position is as follows: supine 74 minutes (58.3%), non-supine 53.0 minutes (41.7%); right 53 minutes (41.7%), left 00 minutes (0.0%), and prone 00 minutes (0.0%). Total supine REM sleep time was 00 minutes (0.0% of total REM sleep).  LIMB MOVEMENTS: There were 0 periodic limb movements of sleep (0.0/h), of which 0 (0.0/h) were associated with an arousal.   EEG: Review of the EEG showed no abnormal electrical discharges and symmetrical bihemispheric findings.    EKG: The EKG revealed normal sinus rhythm (NSR) with rare PVCs and PACs noted. The average heart rate during sleep was 51 bpm.   AUDIO/VIDEO REVIEW: The audio and video review did not show any abnormal or unusual behaviors, movements, phonations or vocalizations. The patient took 2 restroom breaks. Snoring was noted, but improved during the titration.  TITRATION DETAILS (SEE ALSO TABLE AT THE END OF THE REPORT):  The patient was fitted with a small/wide F30 fullface mask from [employer].  CPAP was started at a pressure of 5 cm and gradually increased to a final titration pressure of 9 cm.  On the final pressure she achieved a total sleep time of 43 minutes with a residual AHI of 7/h with evidence of significant central apneas especially on a pressure of 8 cm and 9 cm.  No REM sleep was achieved during the study, O2 nadir was 95% and brief supine sleep achieved on the pressure of 9 cm with EPR of 2 cm.  The patient had significant difficulty initiating and maintaining sleep throughout the night.   POST-STUDY QUESTIONNAIRE: Post study, the patient indicated, that sleep was the same as usual.    IMPRESSION:    1. Obstructive Sleep Apnea (OSA) 2. Treatment emergent Central Sleep Apnea (CSA) 3.  Dysfunctions associated with sleep stages or arousal from sleep 4. Poor sleep pattern 5. Non-specific abnormal EKG   RECOMMENDATIONS:  1. This study was limited secondary to significant and low sleep efficiency at 31.2%.  No slow-wave sleep or REM sleep were achieved during this titration and the patient had evidence of treatment emergent central apneas.  I recommend changing her AutoPap therapy to home CPAP of 9 cm with EPR of 2 with a F40 fullface mask from [employer] or mask of choice, sized to fit.  Unfortunately, there was poor sleep consolidation and only light stage sleep was achieved during the study.   2. This study shows sleep fragmentation and abnormal sleep stage percentages; these are nonspecific findings and per se do not signify an intrinsic sleep disorder or a cause for the patient's sleep-related symptoms. Causes include (but are not limited to) the first night effect of the sleep study, circadian rhythm disturbances, medication effect or an underlying mood disorder or medical problem.  3. The patient should be cautioned not to drive, work at heights, or operate dangerous or heavy equipment when tired or sleepy. Review and reiteration of good sleep hygiene measures should  be pursued with any patient. 4. The study showed rare PACs and PVCs on single lead EKG; clinical correlation is recommended. The patient is well-known to cardiology.  5. The patient will be seen in follow-up in the sleep clinic at [facility] for discussion of the test results, symptom and treatment compliance review, further management strategies, etc. The referring provider will be notified of the test results.    I certify that I have reviewed the entire raw data recording prior to the issuance of this report in accordance with the Standards of Accreditation of the American Academy of Sleep Medicine (AASM).  Huston Foley, MD, PhD Medical Director, [facility]) Diplomat, ABPN  (Neurology and Sleep)               Technical Report:   [facility] CPAP Summary    General Information  Name: Ersa, Senseney BMI: 32.78 Physician: Huston Foley, MD  ID: [id number] Height: 65.0 in Technician: Domingo Cocking, RPSGT  Sex: Female Weight: 197.0 lb Record: [id number]  Age: 75 [12/26/1945] Date: 10/25/2022     Medical & Medication History    Ms. Tupa is a 77 year old right-handed woman with an underlying medical history of back pain, gout, coronary artery disease with history of MI, hypertension, peripheral vascular disease, reflux disease, diabetes and obesity, who presents for follow up consultation of her sleep disorder. The patient is unaccompanied today and presents after over 2 years. I last saw her on 06/05/19, at which time we talked about her sleep study results from 05/18/2019. Overall AHI was 13.1/h, REM AHI 30.4/h, supine AHI 17.9/h, O2 nadir 81%. She was encouraged to consider CPAP or AutoPap therapy. At last visit in May 2023 with Dr. Frances Furbish, plan to pursue CPAP. Insurance required a new sleep study. HST in March 2024 showed mild sleep apnea with a total AHI of 12.5/hour and O2 nadir of 88% . Since starting CPAP, several calls that she cannot tolerate. An in lab sleep titration has been ordered. Her compliance is poor. Mentions everything is going bad for her right now with her health. Told enlarged heart, needs [place] of her thyroid. Eye issues since cataract repair. Claims cannot tolerate the CPAP with the pressure, feels like she is going to suffocate. Not using CPAP at all. Started using nasal pillow mask, pressure was too much, tried full face mask made her claustrophobic. CPAP titration has been ordered but she is not approved. She knows she needs CPAP, not getting proper sleep at night. Sleeping in recliner, less headaches, less nocturia. Has daytime fatigue, sleeps more during the day than at night. ESS 10. Has lost about 10  lbs. Reports she cannot tolerate, despite pressure reduction down to 7-11 cm water, trying nasal pillow mask, FFM. For this reason, CPAP titration has been ordered.  Praluent, Norvasc, Vitamin D3, Plavix, Co-Q10, Colchicine, Flonase, Toprol-XL, Novolog Flexpen, Ozempic, Aldactazide, Dexilant, Antivert   Sleep Disorder      Comments   Patient arrived for a CPAP titration polysomnogram. Patient has a history of OSA and was prescribed an Auto PAP. Patient has thus far been unable to tolerate Auto PAP therapy. Patient had tried different masks and different pressure settings. Equipment was returned as she was non-compliant. Here to be titrated. Procedure explained and all questions answered. Standard paste setup without complications. CPAP was started at 5cm using patient mask of choice, a S/W AirFit F40, after having been shown CPAP at 5cm with a large Eson 2  and the AirFit F40 FFM. EPR was used at 2, in an effort to promote sleep. Patient slept right and supine elevated on a wedge (as she sleeps in a recliner at home). No significant snoring was heard. Respiratory events observed. CPAP was increased to 9cm in an effort to control respiratory events and abolish snoring. Cardiac arrhythmias observed, having occasional PAC's and PVC's. Patient has a known cardiac history. No significant PLMS observed. Two restroom visits.    CPAP start time: 10:08:15 PM CPAP end time: 04:54:58 AM   Time Total Supine Side Prone Upright  Recording (TRT) 6h 46.102m 3h 39.64m 3h 7.4m 0h 0.37m 0h 0.39m  Sleep (TST) 2h 7.79m 1h 14.7m 0h 53.39m 0h 0.32m 0h 0.109m   Latency N1 N2 N3 REM Onset Per. Slp. Eff.  Actual 0h 0.14m 1h 1.14m 0h 0.47m 0h 0.55m 0h 10.69m 1h 10.71m 31.24%   Stg Dur Wake N1 N2 N3 REM  Total 279.5 20.0 107.0 0.0 0.0  Supine 145.5 9.5 64.5 0.0 0.0  Side 134.0 10.5 42.5 0.0 0.0  Prone 0.0 0.0 0.0 0.0 0.0  Upright 0.0 0.0 0.0 0.0 0.0   Stg % Wake N1 N2 N3 REM  Total 68.8 15.7 84.3 0.0 0.0  Supine 35.8 7.5 50.8 0.0 0.0   Side 33.0 8.3 33.5 0.0 0.0  Prone 0.0 0.0 0.0 0.0 0.0  Upright 0.0 0.0 0.0 0.0 0.0     Apnea Summary Sub Supine Side Prone Upright  Total 21 Total 21 15 6  0 0    REM 0 0 0 0 0    NREM 21 15 6  0 0  Obs 0 REM 0 0 0 0 0    NREM 0 0 0 0 0  Mix 5 REM 0 0 0 0 0    NREM 5 5 0 0 0  Cen 16 REM 0 0 0 0 0    NREM 16 10 6  0 0   Rera Summary Sub Supine Side Prone Upright  Total 0 Total 0 0 0 0 0    REM 0 0 0 0 0    NREM 0 0 0 0 0   Hypopnea Summary Sub Supine Side Prone Upright  Total 16 Total 16 15 1  0 0    REM 0 0 0 0 0    NREM 16 15 1  0 0   4% Hypopnea Summary Sub Supine Side Prone Upright  Total (4%) 0 Total 0 0 0 0 0    REM 0 0 0 0 0    NREM 0 0 0 0 0     AHI Total Obs Mix Cen  17.48 Apnea 9.92 0.00 2.36 7.56   Hypopnea 7.56 -- -- --  9.92 Hypopnea (4%) 0.00 -- -- --    Total Supine Side Prone Upright  Position AHI 17.48 24.32 7.92 0.00 0.00  REM AHI 0.00   NREM AHI 17.48   Position RDI 17.48 24.32 7.92 0.00 0.00  REM RDI 0.00   NREM RDI 17.48    4% Hypopnea Total Supine Side Prone Upright  Position AHI (4%) 9.92 12.16 6.79 0.00 0.00  REM AHI (4%) 0.00   NREM AHI (4%) 9.92   Position RDI (4%) 9.92 12.16 6.79 0.00 0.00  REM RDI (4%) 0.00   NREM RDI (4%) 9.92    Desaturation Information  <100% <90% <80% <70% <60% <50% <40%  Supine 12 1 0 0 0 0 0  Side 31 1 0 0 0 0 0  Prone 0  0 0 0 0 0 0  Upright 0 0 0 0 0 0 0  Total 43 2 0 0 0 0 0  Desaturation threshold setting: 3% Minimum desaturation setting: 10 seconds SaO2 nadir: 64% The longest event was a 35 sec obstructive Hypopnea with a minimum SaO2 of 96%. The lowest SaO2 was 76% associated with a 13 sec central Apnea. EKG Rates EKG Avg Max Min  Awake 52 78 43  Asleep 51 61 44  EKG Events: N/A Awakening/Arousal Information # of Awakenings 23  Wake after sleep onset 269.22m  Wake after persistent sleep 213.4m   Arousal Assoc. Arousals Index  Apneas 10 4.7  Hypopneas 12 5.7  Leg Movements 1 0.5  Snore 0.0  0.0  PTT Arousals 0 0.0  Spontaneous 11 5.2  Total 34 16.1  Myoclonus Information PLMS LMs Index  Total LMs during PLMS 0 0.0  LMs w/ Microarousals 0 0.0   LM LMs Index  w/ Microarousal 1 0.5  w/ Awakening 1 0.5  w/ Resp Event 0 0.0  Spontaneous 0 0.0  Total 1 0.5      Titration Table:  [facility] CPAP/Bilevel Report    General Information  Name: Carolyne, Zierke BMI: 32 Physician: Huston Foley, MD   ID: [id number] Height: 65 in Technician: Domingo Cocking  Sex: Female Weight: 197 lb Record: x36rrddedhcuumcc  Age: 44 [March 06, 1946] Date: 10/25/2022 Scorer: Domingo Cocking   Recommended Settings IPAP: N/A cmH20 EPAP: N/A cmH2O AHI: N/A AHI (4%): N/A   Pressure IPAP/EPAP 00 05 06 07 08 09   O2 Vol 0.0 0.0 0.0 0.0 0.0 0.0  Time TRT 0.33m 38.22m 12.51m 32.60m 168.64m 155.15m   TST 0.31m 1.22m 1.19m 13.45m 68.31m 43.82m  Sleep Stage % Wake 0.0 96.1 92.0 57.8 59.6 72.3   % REM 0.0 0.0 0.0 0.0 0.0 0.0   % N1 0.0 100.0 100.0 18.5 14.0 12.8   % N2 0.0 0.0 0.0 81.5 86.0 87.2   % N3 0.0 0.0 0.0 0.0 0.0 0.0  Respiratory Total Events 0 3 2 8 19 5    Obs. Apn. 0 0 0 0 0 0   Mixed Apn. 0 0 0 3 2 0   Cen. Apn. 0 0 0 3 9 4    Hypopneas 0 3 2 2 8 1    AHI 0.00 120.00 120.00 35.56 16.76 6.98   Supine AHI 0.00 120.00 120.00 35.56 15.85 36.00   Prone AHI 0.00 0.00 0.00 0.00 0.00 0.00   Side AHI 0.00 0.00 0.00 0.00 20.00 3.16  Respiratory (4%) Hypopneas (4%) 0.00 0.00 0.00 0.00 0.00 0.00   AHI (4%) 0.00 0.00 0.00 26.67 9.71 5.58   Supine AHI (4%) 0.00 0.00 0.00 26.67 6.79 36.00   Prone AHI (4%) 0.00 0.00 0.00 0.00 0.00 0.00   Side AHI (4%) 0.00 0.00 0.00 0.00 20.00 1.58  Desat Profile <= 90% 0.68m 0.103m 0.29m 0.57m 6.25m 6.50m   <= 80% 0.59m 0.33m 0.29m 0.36m 6.101m 6.42m   <= 70% 0.82m 0.33m 0.41m 0.9m 6.69m 6.48m   <= 60% 0.34m 0.18m 0.47m 0.69m 6.31m 6.63m  Arousal Index Apnea 0.0 0.0 0.0 4.4 4.4 5.6   Hypopnea 0.0 120.0 120.0 8.9 3.5 1.4   LM 0.0 0.0 0.0 0.0 0.0 1.4    Spontaneous 0.0 80.0 0.0 4.4 4.4 4.2

## 2022-11-02 ENCOUNTER — Ambulatory Visit: Payer: Medicare PPO | Attending: Nurse Practitioner

## 2022-11-02 DIAGNOSIS — I2510 Atherosclerotic heart disease of native coronary artery without angina pectoris: Secondary | ICD-10-CM | POA: Diagnosis not present

## 2022-11-02 DIAGNOSIS — Z794 Long term (current) use of insulin: Secondary | ICD-10-CM | POA: Diagnosis not present

## 2022-11-02 DIAGNOSIS — E1122 Type 2 diabetes mellitus with diabetic chronic kidney disease: Secondary | ICD-10-CM | POA: Diagnosis not present

## 2022-11-02 DIAGNOSIS — I10 Essential (primary) hypertension: Secondary | ICD-10-CM

## 2022-11-02 DIAGNOSIS — R079 Chest pain, unspecified: Secondary | ICD-10-CM | POA: Diagnosis not present

## 2022-11-02 DIAGNOSIS — N1831 Chronic kidney disease, stage 3a: Secondary | ICD-10-CM

## 2022-11-02 DIAGNOSIS — R600 Localized edema: Secondary | ICD-10-CM | POA: Diagnosis not present

## 2022-11-02 DIAGNOSIS — I480 Paroxysmal atrial fibrillation: Secondary | ICD-10-CM

## 2022-11-03 LAB — BASIC METABOLIC PANEL
BUN/Creatinine Ratio: 22 (ref 12–28)
BUN: 26 mg/dL (ref 8–27)
CO2: 21 mmol/L (ref 20–29)
Calcium: 8.9 mg/dL (ref 8.7–10.3)
Chloride: 106 mmol/L (ref 96–106)
Creatinine, Ser: 1.2 mg/dL — ABNORMAL HIGH (ref 0.57–1.00)
Glucose: 150 mg/dL — ABNORMAL HIGH (ref 70–99)
Potassium: 4 mmol/L (ref 3.5–5.2)
Sodium: 141 mmol/L (ref 134–144)
eGFR: 46 mL/min/{1.73_m2} — ABNORMAL LOW (ref >59)

## 2022-11-04 ENCOUNTER — Telehealth: Payer: Self-pay

## 2022-11-04 NOTE — Telephone Encounter (Signed)
LVM for patient to callback to discuss the results of her recent inlab titration sleep study

## 2022-11-09 ENCOUNTER — Telehealth: Payer: Self-pay | Admitting: *Deleted

## 2022-11-09 DIAGNOSIS — G4733 Obstructive sleep apnea (adult) (pediatric): Secondary | ICD-10-CM

## 2022-11-09 NOTE — Telephone Encounter (Signed)
Spoke with patient and discussed sleep study results. She is amenable to switching to cpap but mentioned that she had already returned her autopap machine. I discussed her questions.  The patient states that she has an enlarged thyroid and she really thinks this has something to do with her difficulty sleeping, etc.  She states she has to sleep in a recliner.  She states that phlegm collects, she coughs.  She thinks it is related to her thyroid.  She is asking her primary care for a specialist referral.  We will use [facility] again.  Patient will watch for a call from them.  I moved up her appointment with Sarah NP from 03/08/2023 to 01/25/2023 at 12:45 PM arrival 12:15 PM.  The patient verbalized appreciation for the call.  Order sent to [facility].

## 2022-11-09 NOTE — Telephone Encounter (Signed)
-----   Message from Huston Foley sent at 11/01/2022  6:43 PM EDT ----- Patient has had trouble tolerating autoPAP at home. She had a titration study on 10/25/22.  I would like to change her from autoPAP to CPAP of 9 cm with EPR of 2. Unfortunately, she did not sleep very well and achieved only light stage sleep, no deep sleep, nor dream sleep. I placed an order for change from autoPAP to CPAP. She will need a FU appointment in sleep clinic.

## 2022-11-10 ENCOUNTER — Other Ambulatory Visit (HOSPITAL_COMMUNITY): Payer: Self-pay

## 2022-11-11 NOTE — Addendum Note (Signed)
Addended by: Bertram Savin on: 11/11/2022 11:28 AM   Modules accepted: Orders

## 2022-11-11 NOTE — Telephone Encounter (Signed)
[facility] needs the order to reflect that pt needs a new machine. Order written.

## 2022-11-15 ENCOUNTER — Other Ambulatory Visit (HOSPITAL_COMMUNITY): Payer: Self-pay

## 2022-11-15 ENCOUNTER — Telehealth: Payer: Self-pay

## 2022-11-15 NOTE — Telephone Encounter (Signed)
PAP: Patient assistance application for Ozempic, Novolog 70/30, and pen needles has been approved by PAP Companies: [employer] from 11/12/2022 to 03/08/2023. Medication should be delivered to PAP Delivery: Provider's office For further shipping updates, please contact [employer] at [phone] Pt ID is: [id number]

## 2022-11-24 DIAGNOSIS — H5211 Myopia, right eye: Secondary | ICD-10-CM | POA: Diagnosis not present

## 2022-11-24 DIAGNOSIS — H401131 Primary open-angle glaucoma, bilateral, mild stage: Secondary | ICD-10-CM | POA: Diagnosis not present

## 2022-11-24 DIAGNOSIS — H524 Presbyopia: Secondary | ICD-10-CM | POA: Diagnosis not present

## 2022-11-24 DIAGNOSIS — Z961 Presence of intraocular lens: Secondary | ICD-10-CM | POA: Diagnosis not present

## 2022-11-24 NOTE — Progress Notes (Unsigned)
Cardiology Office Note    Patient Name: Dawn Wood Date of Encounter: 11/24/2022  Primary Care Provider:  Dorothyann Peng, MD Primary Cardiologist:  None Primary Electrophysiologist: None   Past Medical History    Past Medical History:  Diagnosis Date   Coronary artery disease    Diabetes mellitus without complication (HCC)    GERD (gastroesophageal reflux disease)    Hypertension    MI (myocardial infarction) (HCC)    Peripheral vascular disease (HCC)     History of Present Illness   Dawn Wood  is a 77 year old female with a PMH of CAD s/p NSTEMI treated with PCI/DES to LAD with 60% RCA and 40% LCx, HTN, HLD, PVD, DM type II, PAF (on Eliquis), arthritis who presents today for 41-month follow-up.   [name] was last seen on 10/25/2022 for 24-month follow-up.  During visit patient reported ongoing shortness of breath and palpitations along with chest discomfort.  She noted relief with as needed Nitrostat and rest.  She was started on Imdur and blood pressures were slightly elevated 140/68.  We discussed completing a Myoview if chest discomfort is not relieved with nitroglycerin and metoprolol was increased to 150 mg daily.  She was started on Lasix 20 mg as needed.  During today's visit the patient reports*** .  Patient denies chest pain, palpitations, dyspnea, PND, orthopnea, nausea, vomiting, dizziness, syncope, edema, weight gain, or early satiety.  ***Notes: -Last ischemic evaluation: -Last echo: -Interim ED visits: Review of Systems  Please see the history of present illness.    All other systems reviewed and are otherwise negative except as noted above.  Physical Exam    Wt Readings from Last 3 Encounters:  10/25/22 213 lb (96.6 kg)  10/21/22 211 lb (95.7 kg)  10/21/22 211 lb (95.7 kg)   OZ:DGUYQ were no vitals filed for this visit.,There is no height or weight on file to calculate BMI. GEN: Well nourished, well developed in no acute  distress Neck: No JVD; No carotid bruits Pulmonary: Clear to auscultation without rales, wheezing or rhonchi  Cardiovascular: Normal rate. Regular rhythm. Normal S1. Normal S2.   Murmurs: There is no murmur.  ABDOMEN: Soft, non-tender, non-distended EXTREMITIES:  No edema; No deformity   EKG/LABS/ Recent Cardiac Studies   ECG personally reviewed by me today - ***  Risk Assessment/Calculations:   {Does this patient have ATRIAL FIBRILLATION?:[id number]}      Lab Results  Component Value Date   WBC 7.4 10/21/2022   HGB 13.0 10/21/2022   HCT 39.9 10/21/2022   MCV 88 10/21/2022   PLT 247 10/21/2022   Lab Results  Component Value Date   CREATININE 1.20 (H) 11/02/2022   BUN 26 11/02/2022   NA 141 11/02/2022   K 4.0 11/02/2022   CL 106 11/02/2022   CO2 21 11/02/2022   Lab Results  Component Value Date   CHOL 195 05/04/2022   HDL 49 05/04/2022   LDLCALC 121 (H) 05/04/2022   LDLDIRECT 83 06/11/2019   TRIG 140 05/04/2022   CHOLHDL 4.0 05/04/2022    Lab Results  Component Value Date   HGBA1C 6.9 (H) 10/21/2022   Assessment & Plan     1.History of CAD: -s/p NSTEMI 2015 treated with DES to LAD with nonobstructive CAD in RCA and circumflex.  Patient had last ischemic evaluation by Steffanie Dunn in 2023 that was low risk and normal. -Today patient reports palpitations and chest discomfort that occurs with activity and is relieved with rest  and short acting nitroglycerin. -Patient was previously prescribed isosorbide however did not began prescription. -She will restart isosorbide 15 mg to 30 mg daily -We will plan to complete Lexiscan Myoview shortness of breath and chest pain is not relieved with nitroglycerin. -We will also increase metoprolol to 150 mg daily -Continue GDMT with Repatha 140 mg q. 14 days   2.Paroxysmal atrial fibrillation -Continue rate control with Toprol-XL 150 mg daily however does report occasional episodes of palpitations occurring over the past few  months. -Patient's last hemoglobin was 14 and creatinine was 1.1 -Continue Eliquis 5 mg twice daily -CHA2DS2-VASc Score = 6 [CHF History: 0, HTN History: 1, Diabetes History: 1, Stroke History: 0, Vascular Disease History: 1, Age Score: 2, Gender Score: 1].  Therefore, the patient's annual risk of stroke is 9.7 %.       3.  Essential hypertension: -Patient's blood pressure today was elevated at 140/68 and was 138/72 on recheck -Continue Benicar 40 mg daily, Aldactone 25 mg daily, Toprol-XL 150 mg daily, chlorthalidone 25 mg daily   4.Obstructive sleep apnea: -Patient is currently in not tolerating CPAP mask and is scheduled to undergo sleep study tomorrow.     5.  Lower extremity edema: -Patient has bilateral +1 lower extremity edema that is nonpitting. -She will start Lasix 20 mg daily x 3 days and Lasix 20 mg as needed -She was advised to weigh herself daily -BMET in 1 week  Disposition: Follow-up with None or APP in *** months {Are you ordering a CV Procedure (e.g. stress test, cath, DCCV, TEE, etc)?   Press F2        :[id number]}   Signed, Napoleon Form, Leodis Rains, NP 11/24/2022, 5:26 PM [facility]

## 2022-11-25 ENCOUNTER — Encounter: Payer: Self-pay | Admitting: Nurse Practitioner

## 2022-11-25 ENCOUNTER — Ambulatory Visit: Payer: Medicare PPO | Attending: Nurse Practitioner | Admitting: Nurse Practitioner

## 2022-11-25 ENCOUNTER — Telehealth: Payer: Self-pay

## 2022-11-25 VITALS — BP 128/68 | HR 60 | Ht 65.0 in | Wt 211.0 lb

## 2022-11-25 DIAGNOSIS — R600 Localized edema: Secondary | ICD-10-CM | POA: Diagnosis not present

## 2022-11-25 DIAGNOSIS — I10 Essential (primary) hypertension: Secondary | ICD-10-CM

## 2022-11-25 DIAGNOSIS — I2510 Atherosclerotic heart disease of native coronary artery without angina pectoris: Secondary | ICD-10-CM | POA: Diagnosis not present

## 2022-11-25 DIAGNOSIS — G4733 Obstructive sleep apnea (adult) (pediatric): Secondary | ICD-10-CM

## 2022-11-25 DIAGNOSIS — I480 Paroxysmal atrial fibrillation: Secondary | ICD-10-CM | POA: Diagnosis not present

## 2022-11-25 MED ORDER — ISOSORBIDE MONONITRATE ER 60 MG PO TB24: 60.0000 mg | ORAL_TABLET | Freq: Every day | ORAL | 7 refills | Status: DC

## 2022-11-25 NOTE — Telephone Encounter (Signed)
Patient received both Ozempic 0.25, 0.5 1x3ML & Novolog70/30 FlexPen. On 11/24/2021.

## 2022-11-25 NOTE — Patient Instructions (Addendum)
Medication Instructions:  INCREASE ISOSORBIDE 60MG  DAILY TAKE YOUR FUROSEMIDE 20MG  DAILY *If you need a refill on your cardiac medications before your next appointment, please call your pharmacy*  Lab Work: BMET IN 1 WEEK (THIS IS NOT FASTING) If you have labs (blood work) drawn today and your tests are completely normal, you will receive your results only by:  MyChart Message (if you have MyChart) OR  A paper copy in the mail If you have any lab test that is abnormal or we need to change your treatment, we will call you to review the results.  Other Instructions REMEMBER TO CHECK WITH MEDICARE ABOUT THE LOW INCOME SUBSIDY PROGRAM PLEASE READ AND FOLLOW ATTACHED LOW SALT AND HEART HEALTHY DIET CALL WITH WEIGHT GAIN MORE THAN 3 POUNDS  Follow-Up: At [facility], you and your health needs are our priority.  As part of our continuing mission to provide you with exceptional heart care, we have created designated Provider Care Teams.  These Care Teams include your primary Cardiologist (physician) and Advanced Practice Providers (APPs -  Physician Assistants and Nurse Practitioners) who all work together to provide you with the care you need, when you need it.  Your next appointment:   6 month(s)  Provider:   DR Wyline Mood OR DR TOBB  DASH Eating Plan-LOW SALT DIET DASH stands for Dietary Approaches to Stop Hypertension. The DASH eating plan is a healthy eating plan that has been shown to: Lower high blood pressure (hypertension). Reduce your risk for type 2 diabetes, heart disease, and stroke. Help with weight loss. What are tips for following this plan? Reading food labels Check food labels for the amount of salt (sodium) per serving. Choose foods with less than 5 percent of the Daily Value (DV) of sodium. In general, foods with less than 300 milligrams (mg) of sodium per serving fit into this eating plan. To find whole grains, look for the word "whole" as the first word in the  ingredient list. Shopping Buy products labeled as "low-sodium" or "no salt added." Buy fresh foods. Avoid canned foods and pre-made or frozen meals. Cooking Try not to add salt when you cook. Use salt-free seasonings or herbs instead of table salt or sea salt. Check with your health care provider or pharmacist before using salt substitutes. Do not fry foods. Cook foods in healthy ways, such as baking, boiling, grilling, roasting, or broiling. Cook using oils that are good for your heart. These include olive, canola, avocado, soybean, and sunflower oil. Meal planning  Eat a balanced diet. This should include: 4 or more servings of fruits and 4 or more servings of vegetables each day. Try to fill half of your plate with fruits and vegetables. 6-8 servings of whole grains each day. 6 or less servings of lean meat, poultry, or fish each day. 1 oz is 1 serving. A 3 oz (85 g) serving of meat is about the same size as the palm of your hand. One egg is 1 oz (28 g). 2-3 servings of low-fat dairy each day. One serving is 1 cup (237 mL). 1 serving of nuts, seeds, or beans 5 times each week. 2-3 servings of heart-healthy fats. Healthy fats called omega-3 fatty acids are found in foods such as walnuts, flaxseeds, fortified milks, and eggs. These fats are also found in cold-water fish, such as sardines, salmon, and mackerel. Limit how much you eat of: Canned or prepackaged foods. Food that is high in trans fat, such as fried foods. Food  that is high in saturated fat, such as fatty meat. Desserts and other sweets, sugary drinks, and other foods with added sugar. Full-fat dairy products. Do not salt foods before eating. Do not eat more than 4 egg yolks a week. Try to eat at least 2 vegetarian meals a week. Eat more home-cooked food and less restaurant, buffet, and fast food. Lifestyle When eating at a restaurant, ask if your food can be made with less salt or no salt. If you drink alcohol: Limit how  much you have to: 0-1 drink a day if you are female. 0-2 drinks a day if you are female. Know how much alcohol is in your drink. In the [place] one drink is one 12 oz bottle of beer (355 mL), one 5 oz glass of wine (148 mL), or one 1 oz glass of hard liquor (44 mL). General information Avoid eating more than 2,300 mg of salt a day. If you have hypertension, you may need to reduce your sodium intake to 1,500 mg a day. Work with your provider to stay at a healthy body weight or lose weight. Ask what the best weight range is for you. On most days of the week, get at least 30 minutes of exercise that causes your heart to beat faster. This may include walking, swimming, or biking. Work with your provider or dietitian to adjust your eating plan to meet your specific calorie needs. What foods should I eat? Fruits All fresh, dried, or frozen fruit. Canned fruits that are in their natural juice and do not have sugar added to them. Vegetables Fresh or frozen vegetables that are raw, steamed, roasted, or grilled. Low-sodium or reduced-sodium tomato and vegetable juice. Low-sodium or reduced-sodium tomato sauce and tomato paste. Low-sodium or reduced-sodium canned vegetables. Grains Whole-grain or whole-wheat bread. Whole-grain or whole-wheat pasta. Brown rice. Orpah Cobb. Bulgur. Whole-grain and low-sodium cereals. Pita bread. Low-fat, low-sodium crackers. Whole-wheat flour tortillas. Meats and other proteins Skinless chicken or [place]. Ground chicken or [place]. Pork with fat trimmed off. Fish and seafood. Egg whites. Dried beans, peas, or lentils. Unsalted nuts, nut butters, and seeds. Unsalted canned beans. Lean cuts of beef with fat trimmed off. Low-sodium, lean precooked or cured meat, such as sausages or meat loaves. Dairy Low-fat (1%) or fat-free (skim) milk. Reduced-fat, low-fat, or fat-free cheeses. Nonfat, low-sodium ricotta or cottage cheese. Low-fat or nonfat yogurt. Low-fat, low-sodium  cheese. Fats and oils Soft margarine without trans fats. Vegetable oil. Reduced-fat, low-fat, or light mayonnaise and salad dressings (reduced-sodium). Canola, safflower, olive, avocado, soybean, and sunflower oils. Avocado. Seasonings and condiments Herbs. Spices. Seasoning mixes without salt. Other foods Unsalted popcorn and pretzels. Fat-free sweets. The items listed above may not be all the foods and drinks you can have. Talk to a dietitian to learn more. What foods should I avoid? Fruits Canned fruit in a light or heavy syrup. Fried fruit. Fruit in cream or butter sauce. Vegetables Creamed or fried vegetables. Vegetables in a cheese sauce. Regular canned vegetables that are not marked as low-sodium or reduced-sodium. Regular canned tomato sauce and paste that are not marked as low-sodium or reduced-sodium. Regular tomato and vegetable juices that are not marked as low-sodium or reduced-sodium. Rosita Fire. Olives. Grains Baked goods made with fat, such as croissants, muffins, or some breads. Dry pasta or rice meal packs. Meats and other proteins Fatty cuts of meat. Ribs. Fried meat. Tomasa Blase. Bologna, salami, and other precooked or cured meats, such as sausages or meat loaves, that are not lean  and low in sodium. Fat from the back of a pig (fatback). Bratwurst. Salted nuts and seeds. Canned beans with added salt. Canned or smoked fish. Whole eggs or egg yolks. Chicken or [place] with skin. Dairy Whole or 2% milk, cream, and half-and-half. Whole or full-fat cream cheese. Whole-fat or sweetened yogurt. Full-fat cheese. Nondairy creamers. Whipped toppings. Processed cheese and cheese spreads. Fats and oils Butter. Stick margarine. Lard. Shortening. Ghee. Bacon fat. Tropical oils, such as coconut, palm kernel, or palm oil. Seasonings and condiments Onion salt, garlic salt, seasoned salt, table salt, and sea salt. Worcestershire sauce. Tartar sauce. Barbecue sauce. Teriyaki sauce. Soy sauce, including  reduced-sodium soy sauce. Steak sauce. Canned and packaged gravies. Fish sauce. Oyster sauce. Cocktail sauce. Store-bought horseradish. Ketchup. Mustard. Meat flavorings and tenderizers. Bouillon cubes. Hot sauces. Pre-made or packaged marinades. Pre-made or packaged taco seasonings. Relishes. Regular salad dressings. Other foods Salted popcorn and pretzels. The items listed above may not be all the foods and drinks you should avoid. Talk to a dietitian to learn more. Where to find more information National Heart, Lung, and Blood Institute (NHLBI): BuffaloDryCleaner.gl American Heart Association (AHA): heart.org Academy of Nutrition and Dietetics: eatright.org National Kidney Foundation (NKF): kidney.org This information is not intended to replace advice given to you by your health care provider. Make sure you discuss any questions you have with your health care provider. Document Revised: 03/11/2022 Document Reviewed: 03/11/2022 Elsevier Patient Education  2024 [employer]  Heart-Healthy Eating Plan Eating a healthy diet is important for the health of your heart. A heart-healthy eating plan includes: Eating less unhealthy fats. Eating more healthy fats. Eating less salt in your food. Salt is also called sodium. Making other changes in your diet. Talk with your doctor or a diet specialist (dietitian) to create an eating plan that is right for you. What is my plan? Your doctor may recommend an eating plan that includes: Total fat: ______% or less of total calories a day. Saturated fat: ______% or less of total calories a day. Cholesterol: less than _________mg a day. Sodium: less than _________mg a day. What are tips for following this plan? Cooking Avoid frying your food. Try to bake, boil, grill, or broil it instead. You can also reduce fat by: Removing the skin from poultry. Removing all visible fats from meats. Steaming vegetables in water or broth. Meal planning  At meals, divide  your plate into four equal parts: Fill one-half of your plate with vegetables and green salads. Fill one-fourth of your plate with whole grains. Fill one-fourth of your plate with lean protein foods. Eat 2-4 cups of vegetables per day. One cup of vegetables is: 1 cup (91 g) broccoli or cauliflower florets. 2 medium carrots. 1 large bell pepper. 1 large sweet potato. 1 large tomato. 1 medium white potato. 2 cups (150 g) raw leafy greens. Eat 1-2 cups of fruit per day. One cup of fruit is: 1 small apple 1 large banana 1 cup (237 g) mixed fruit, 1 large orange,  cup (82 g) dried fruit, 1 cup (240 mL) 100% fruit juice. Eat more foods that have soluble fiber. These are apples, broccoli, carrots, beans, peas, and barley. Try to get 20-30 g of fiber per day. Eat 4-5 servings of nuts, legumes, and seeds per week: 1 serving of dried beans or legumes equals  cup (90 g) cooked. 1 serving of nuts is  oz (12 almonds, 24 pistachios, or 7 walnut halves). 1 serving of seeds equals  oz (8  g). General information Eat more home-cooked food. Eat less restaurant, buffet, and fast food. Limit or avoid alcohol. Limit foods that are high in starch and sugar. Avoid fried foods. Lose weight if you are overweight. Keep track of how much salt (sodium) you eat. This is important if you have high blood pressure. Ask your doctor to tell you more about this. Try to add vegetarian meals each week. Fats Choose healthy fats. These include olive oil and canola oil, flaxseeds, walnuts, almonds, and seeds. Eat more omega-3 fats. These include salmon, mackerel, sardines, tuna, flaxseed oil, and ground flaxseeds. Try to eat fish at least 2 times each week. Check food labels. Avoid foods with trans fats or high amounts of saturated fat. Limit saturated fats. These are often found in animal products, such as meats, butter, and cream. These are also found in plant foods, such as palm oil, palm kernel oil, and  coconut oil. Avoid foods with partially hydrogenated oils in them. These have trans fats. Examples are stick margarine, some tub margarines, cookies, crackers, and other baked goods. What foods should I eat? Fruits All fresh, canned (in natural juice), or frozen fruits. Vegetables Fresh or frozen vegetables (raw, steamed, roasted, or grilled). Green salads. Grains Most grains. Choose whole wheat and whole grains most of the time. Rice and pasta, including brown rice and pastas made with whole wheat. Meats and other proteins Lean, well-trimmed beef, veal, pork, and lamb. Chicken and [place] without skin. All fish and shellfish. Wild duck, rabbit, pheasant, and venison. Egg whites or low-cholesterol egg substitutes. Dried beans, peas, lentils, and tofu. Seeds and most nuts. Dairy Low-fat or nonfat cheeses, including ricotta and mozzarella. Skim or 1% milk that is liquid, powdered, or evaporated. Buttermilk that is made with low-fat milk. Nonfat or low-fat yogurt. Fats and oils Non-hydrogenated (trans-free) margarines. Vegetable oils, including soybean, sesame, sunflower, olive, peanut, safflower, corn, canola, and cottonseed. Salad dressings or mayonnaise made with a vegetable oil. Beverages Mineral water. Coffee and tea. Diet carbonated beverages. Sweets and desserts Sherbet, gelatin, and fruit ice. Small amounts of dark chocolate. Limit all sweets and desserts. Seasonings and condiments All seasonings and condiments. The items listed above may not be a complete list of foods and drinks you can eat. Contact a dietitian for more options. What foods should I avoid? Fruits Canned fruit in heavy syrup. Fruit in cream or butter sauce. Fried fruit. Limit coconut. Vegetables Vegetables cooked in cheese, cream, or butter sauce. Fried vegetables. Grains Breads that are made with saturated or trans fats, oils, or whole milk. Croissants. Sweet rolls. Donuts. High-fat crackers, such as cheese  crackers. Meats and other proteins Fatty meats, such as hot dogs, ribs, sausage, bacon, rib-eye roast or steak. High-fat deli meats, such as salami and bologna. Caviar. Domestic duck and goose. Organ meats, such as liver. Dairy Cream, sour cream, cream cheese, and creamed cottage cheese. Whole-milk cheeses. Whole or 2% milk that is liquid, evaporated, or condensed. Whole buttermilk. Cream sauce or high-fat cheese sauce. Yogurt that is made from whole milk. Fats and oils Meat fat, or shortening. Cocoa butter, hydrogenated oils, palm oil, coconut oil, palm kernel oil. Solid fats and shortenings, including bacon fat, salt pork, lard, and butter. Nondairy cream substitutes. Salad dressings with cheese or sour cream. Beverages Regular sodas and juice drinks with added sugar. Sweets and desserts Frosting. Pudding. Cookies. Cakes. Pies. Milk chocolate or white chocolate. Buttered syrups. Full-fat ice cream or ice cream drinks. The items listed above may not be  a complete list of foods and drinks to avoid. Contact a dietitian for more information. Summary Heart-healthy meal planning includes eating less unhealthy fats, eating more healthy fats, and making other changes in your diet. Eat a balanced diet. This includes fruits and vegetables, low-fat or nonfat dairy, lean protein, nuts and legumes, whole grains, and heart-healthy oils and fats. This information is not intended to replace advice given to you by your health care provider. Make sure you discuss any questions you have with your health care provider. Document Revised: 03/30/2021 Document Reviewed: 03/30/2021 Elsevier Patient Education  2024 [employer].

## 2022-11-30 ENCOUNTER — Ambulatory Visit (HOSPITAL_COMMUNITY): Admit: 2022-11-30 | Payer: Medicare PPO | Admitting: Cardiology

## 2022-11-30 ENCOUNTER — Encounter (HOSPITAL_COMMUNITY)
Admission: EM | Disposition: A | Discharge status: Home / Self Care | Payer: Self-pay | Source: Home / Self Care | Attending: Cardiology

## 2022-11-30 ENCOUNTER — Inpatient Hospital Stay (HOSPITAL_COMMUNITY)
Admission: EM | Admit: 2022-11-30 | Discharge: 2022-12-04 | DRG: 322 | Disposition: A | Discharge status: Home / Self Care | Payer: Medicare PPO | Attending: Cardiology | Admitting: Cardiology

## 2022-11-30 DIAGNOSIS — Z9841 Cataract extraction status, right eye: Secondary | ICD-10-CM

## 2022-11-30 DIAGNOSIS — M109 Gout, unspecified: Secondary | ICD-10-CM | POA: Diagnosis present

## 2022-11-30 DIAGNOSIS — Z7902 Long term (current) use of antithrombotics/antiplatelets: Secondary | ICD-10-CM | POA: Diagnosis not present

## 2022-11-30 DIAGNOSIS — Z87891 Personal history of nicotine dependence: Secondary | ICD-10-CM | POA: Diagnosis not present

## 2022-11-30 DIAGNOSIS — Z8249 Family history of ischemic heart disease and other diseases of the circulatory system: Secondary | ICD-10-CM | POA: Diagnosis not present

## 2022-11-30 DIAGNOSIS — I472 Ventricular tachycardia, unspecified: Secondary | ICD-10-CM | POA: Diagnosis not present

## 2022-11-30 DIAGNOSIS — I129 Hypertensive chronic kidney disease with stage 1 through stage 4 chronic kidney disease, or unspecified chronic kidney disease: Secondary | ICD-10-CM | POA: Diagnosis not present

## 2022-11-30 DIAGNOSIS — E1122 Type 2 diabetes mellitus with diabetic chronic kidney disease: Secondary | ICD-10-CM | POA: Diagnosis not present

## 2022-11-30 DIAGNOSIS — M10072 Idiopathic gout, left ankle and foot: Secondary | ICD-10-CM | POA: Diagnosis not present

## 2022-11-30 DIAGNOSIS — I252 Old myocardial infarction: Secondary | ICD-10-CM

## 2022-11-30 DIAGNOSIS — E785 Hyperlipidemia, unspecified: Secondary | ICD-10-CM | POA: Diagnosis present

## 2022-11-30 DIAGNOSIS — E7849 Other hyperlipidemia: Secondary | ICD-10-CM | POA: Diagnosis not present

## 2022-11-30 DIAGNOSIS — I480 Paroxysmal atrial fibrillation: Secondary | ICD-10-CM | POA: Diagnosis not present

## 2022-11-30 DIAGNOSIS — E669 Obesity, unspecified: Secondary | ICD-10-CM | POA: Diagnosis present

## 2022-11-30 DIAGNOSIS — I2102 ST elevation (STEMI) myocardial infarction involving left anterior descending coronary artery: Secondary | ICD-10-CM | POA: Diagnosis not present

## 2022-11-30 DIAGNOSIS — N1831 Chronic kidney disease, stage 3a: Secondary | ICD-10-CM | POA: Diagnosis present

## 2022-11-30 DIAGNOSIS — Z794 Long term (current) use of insulin: Secondary | ICD-10-CM | POA: Diagnosis not present

## 2022-11-30 DIAGNOSIS — Z7901 Long term (current) use of anticoagulants: Secondary | ICD-10-CM | POA: Diagnosis not present

## 2022-11-30 DIAGNOSIS — K219 Gastro-esophageal reflux disease without esophagitis: Secondary | ICD-10-CM | POA: Diagnosis present

## 2022-11-30 DIAGNOSIS — Z8639 Personal history of other endocrine, nutritional and metabolic disease: Secondary | ICD-10-CM | POA: Diagnosis not present

## 2022-11-30 DIAGNOSIS — Z955 Presence of coronary angioplasty implant and graft: Secondary | ICD-10-CM

## 2022-11-30 DIAGNOSIS — Z7982 Long term (current) use of aspirin: Secondary | ICD-10-CM | POA: Diagnosis not present

## 2022-11-30 DIAGNOSIS — I2510 Atherosclerotic heart disease of native coronary artery without angina pectoris: Secondary | ICD-10-CM

## 2022-11-30 DIAGNOSIS — Z79899 Other long term (current) drug therapy: Secondary | ICD-10-CM

## 2022-11-30 DIAGNOSIS — N179 Acute kidney failure, unspecified: Secondary | ICD-10-CM | POA: Diagnosis not present

## 2022-11-30 DIAGNOSIS — Z6833 Body mass index (BMI) 33.0-33.9, adult: Secondary | ICD-10-CM | POA: Diagnosis not present

## 2022-11-30 DIAGNOSIS — Z9842 Cataract extraction status, left eye: Secondary | ICD-10-CM

## 2022-11-30 DIAGNOSIS — R9431 Abnormal electrocardiogram [ECG] [EKG]: Secondary | ICD-10-CM | POA: Diagnosis not present

## 2022-11-30 DIAGNOSIS — E1151 Type 2 diabetes mellitus with diabetic peripheral angiopathy without gangrene: Secondary | ICD-10-CM | POA: Diagnosis present

## 2022-11-30 DIAGNOSIS — I10 Essential (primary) hypertension: Secondary | ICD-10-CM

## 2022-11-30 DIAGNOSIS — G4733 Obstructive sleep apnea (adult) (pediatric): Secondary | ICD-10-CM | POA: Diagnosis present

## 2022-11-30 DIAGNOSIS — R079 Chest pain, unspecified: Secondary | ICD-10-CM | POA: Diagnosis not present

## 2022-11-30 DIAGNOSIS — I213 ST elevation (STEMI) myocardial infarction of unspecified site: Secondary | ICD-10-CM

## 2022-11-30 DIAGNOSIS — I491 Atrial premature depolarization: Secondary | ICD-10-CM | POA: Diagnosis not present

## 2022-11-30 DIAGNOSIS — R231 Pallor: Secondary | ICD-10-CM | POA: Diagnosis not present

## 2022-11-30 HISTORY — PX: CORONARY STENT INTERVENTION: CATH118234

## 2022-11-30 HISTORY — PX: CORONARY ULTRASOUND/IVUS: CATH118244

## 2022-11-30 HISTORY — PX: LEFT HEART CATH AND CORONARY ANGIOGRAPHY: CATH118249

## 2022-11-30 LAB — CBC WITH DIFFERENTIAL/PLATELET
Abs Immature Granulocytes: 0.04 10*3/uL (ref 0.00–0.07)
Basophils Absolute: 0 10*3/uL (ref 0.0–0.1)
Basophils Relative: 0 %
Eosinophils Absolute: 0.2 10*3/uL (ref 0.0–0.5)
Eosinophils Relative: 2 %
HCT: 42.9 % (ref 36.0–46.0)
Hemoglobin: 14.1 g/dL (ref 12.0–15.0)
Immature Granulocytes: 0 %
Lymphocytes Relative: 12 %
Lymphs Abs: 1.4 10*3/uL (ref 0.7–4.0)
MCH: 28.5 pg (ref 26.0–34.0)
MCHC: 32.9 g/dL (ref 30.0–36.0)
MCV: 86.8 fL (ref 80.0–100.0)
Monocytes Absolute: 0.9 10*3/uL (ref 0.1–1.0)
Monocytes Relative: 8 %
Neutro Abs: 9.3 10*3/uL — ABNORMAL HIGH (ref 1.7–7.7)
Neutrophils Relative %: 78 %
Platelets: 240 10*3/uL (ref 150–400)
RBC: 4.94 MIL/uL (ref 3.87–5.11)
RDW: 13.8 % (ref 11.5–15.5)
WBC: 11.9 10*3/uL — ABNORMAL HIGH (ref 4.0–10.5)
nRBC: 0 % (ref 0.0–0.2)

## 2022-11-30 LAB — LIPID PANEL
Cholesterol: 177 mg/dL (ref 0–200)
HDL: 40 mg/dL — ABNORMAL LOW (ref >40)
LDL Cholesterol: 112 mg/dL — ABNORMAL HIGH (ref 0–99)
Total CHOL/HDL Ratio: 4.4 RATIO
Triglycerides: 127 mg/dL (ref <150)
VLDL: 25 mg/dL (ref 0–40)

## 2022-11-30 LAB — HEMOGLOBIN A1C
Hgb A1c MFr Bld: 7.1 % — ABNORMAL HIGH (ref 4.8–5.6)
Mean Plasma Glucose: 157.07 mg/dL

## 2022-11-30 LAB — COMPREHENSIVE METABOLIC PANEL
ALT: 15 U/L (ref 0–44)
AST: 16 U/L (ref 15–41)
Albumin: 3.6 g/dL (ref 3.5–5.0)
Alkaline Phosphatase: 64 U/L (ref 38–126)
Anion gap: 12 (ref 5–15)
BUN: 26 mg/dL — ABNORMAL HIGH (ref 8–23)
CO2: 22 mmol/L (ref 22–32)
Calcium: 9.1 mg/dL (ref 8.9–10.3)
Chloride: 104 mmol/L (ref 98–111)
Creatinine, Ser: 1.19 mg/dL — ABNORMAL HIGH (ref 0.44–1.00)
GFR, Estimated: 47 mL/min — ABNORMAL LOW (ref >60)
Glucose, Bld: 194 mg/dL — ABNORMAL HIGH (ref 70–99)
Potassium: 3.4 mmol/L — ABNORMAL LOW (ref 3.5–5.1)
Sodium: 138 mmol/L (ref 135–145)
Total Bilirubin: 1.2 mg/dL (ref 0.3–1.2)
Total Protein: 7.4 g/dL (ref 6.5–8.1)

## 2022-11-30 LAB — POCT ACTIVATED CLOTTING TIME
Activated Clotting Time: 257 seconds
Activated Clotting Time: 299 seconds
Activated Clotting Time: 293 seconds

## 2022-11-30 LAB — PROTIME-INR
INR: 1.1 (ref 0.8–1.2)
Prothrombin Time: 14.4 seconds (ref 11.4–15.2)

## 2022-11-30 LAB — GLUCOSE, CAPILLARY: Glucose-Capillary: 187 mg/dL — ABNORMAL HIGH (ref 70–99)

## 2022-11-30 LAB — TROPONIN I (HIGH SENSITIVITY)
Troponin I (High Sensitivity): 10 ng/L (ref <18)
Troponin I (High Sensitivity): 4094 ng/L (ref <18)

## 2022-11-30 LAB — CG4 I-STAT (LACTIC ACID): Lactic Acid, Venous: 1.5 mmol/L (ref 0.5–1.9)

## 2022-11-30 LAB — APTT: aPTT: 45 seconds — ABNORMAL HIGH (ref 24–36)

## 2022-11-30 SURGERY — LEFT HEART CATH AND CORONARY ANGIOGRAPHY
Anesthesia: LOCAL

## 2022-11-30 MED ORDER — ACETAMINOPHEN 325 MG PO TABS
650.0000 mg | ORAL_TABLET | ORAL | Status: DC | PRN
  Administered 2022-11-30 – 2022-12-01 (×2): 650 mg via ORAL

## 2022-11-30 MED ORDER — IOHEXOL 350 MG/ML SOLN
INTRAVENOUS | Status: DC | PRN
  Administered 2022-11-30: 275 mL

## 2022-11-30 MED ORDER — INSULIN ASPART 100 UNIT/ML IJ SOLN: 0.0000 [IU] | Freq: Three times a day (TID) | INTRAMUSCULAR | Status: DC

## 2022-11-30 MED ORDER — ONDANSETRON HCL 4 MG/2ML IJ SOLN: 4.0000 mg | Freq: Four times a day (QID) | INTRAMUSCULAR | Status: DC | PRN

## 2022-11-30 MED ORDER — HYDRALAZINE HCL 20 MG/ML IJ SOLN
INTRAMUSCULAR | Status: DC | PRN
  Administered 2022-11-30: 10 mg via INTRAVENOUS

## 2022-11-30 MED ORDER — DORZOLAMIDE HCL-TIMOLOL MAL 2-0.5 % OP SOLN
1.0000 [drp] | Freq: Two times a day (BID) | OPHTHALMIC | Status: DC
  Administered 2022-11-30 – 2022-12-04 (×8): 1 [drp] via OPHTHALMIC
  Filled 2022-11-30: qty 10

## 2022-11-30 MED ORDER — NITROGLYCERIN 1 MG/10 ML FOR IR/CATH LAB
INTRA_ARTERIAL | Status: DC | PRN
  Administered 2022-11-30 (×2): 200 ug

## 2022-11-30 MED ORDER — ORAL CARE MOUTH RINSE: 15.0000 mL | OROMUCOSAL | Status: DC | PRN

## 2022-11-30 MED ORDER — HEPARIN SODIUM (PORCINE) 1000 UNIT/ML IJ SOLN
INTRAMUSCULAR | Status: DC | PRN
  Administered 2022-11-30: 3000 [IU] via INTRAVENOUS
  Administered 2022-11-30: 10000 [IU] via INTRAVENOUS

## 2022-11-30 MED ORDER — LATANOPROSTENE BUNOD 0.024 % OP SOLN: 1.0000 [drp] | Freq: Three times a day (TID) | OPHTHALMIC | Status: DC

## 2022-11-30 MED ORDER — SODIUM CHLORIDE (HYPERTONIC) 5 % OP SOLN: 1.0000 [drp] | Freq: Two times a day (BID) | OPHTHALMIC | Status: DC

## 2022-11-30 MED ORDER — HEPARIN SODIUM (PORCINE) 1000 UNIT/ML IJ SOLN
INTRAMUSCULAR | Status: AC
  Filled 2022-11-30: qty 10

## 2022-11-30 MED ORDER — SPIRONOLACTONE 25 MG PO TABS
25.0000 mg | ORAL_TABLET | Freq: Every day | ORAL | Status: DC
  Administered 2022-12-01: 25 mg via ORAL
  Filled 2022-11-30 (×2): qty 1

## 2022-11-30 MED ORDER — FENTANYL CITRATE PF 50 MCG/ML IJ SOSY: 25.0000 ug | PREFILLED_SYRINGE | Freq: Once | INTRAMUSCULAR | Status: DC | PRN

## 2022-11-30 MED ORDER — ACETAMINOPHEN 325 MG PO TABS
650.0000 mg | ORAL_TABLET | ORAL | Status: DC | PRN
  Filled 2022-11-30 (×2): qty 2

## 2022-11-30 MED ORDER — LABETALOL HCL 5 MG/ML IV SOLN
10.0000 mg | INTRAVENOUS | Status: AC | PRN
  Administered 2022-11-30 (×2): 10 mg via INTRAVENOUS
  Filled 2022-11-30 (×2): qty 4

## 2022-11-30 MED ORDER — OXYCODONE HCL 5 MG PO TABS
5.0000 mg | ORAL_TABLET | Freq: Once | ORAL | Status: AC
  Administered 2022-11-30: 5 mg via ORAL
  Filled 2022-11-30: qty 1

## 2022-11-30 MED ORDER — LATANOPROST 0.005 % OP SOLN
1.0000 [drp] | Freq: Every day | OPHTHALMIC | Status: DC
  Administered 2022-11-30 – 2022-12-03 (×4): 1 [drp] via OPHTHALMIC
  Filled 2022-11-30: qty 2.5

## 2022-11-30 MED ORDER — CLOPIDOGREL BISULFATE 75 MG PO TABS
75.0000 mg | ORAL_TABLET | Freq: Every day | ORAL | Status: DC
  Administered 2022-12-01 – 2022-12-04 (×4): 75 mg via ORAL
  Filled 2022-11-30 (×4): qty 1

## 2022-11-30 MED ORDER — IRBESARTAN 150 MG PO TABS
300.0000 mg | ORAL_TABLET | Freq: Every day | ORAL | Status: DC
  Administered 2022-12-01: 300 mg via ORAL
  Filled 2022-11-30: qty 1

## 2022-11-30 MED ORDER — HEPARIN (PORCINE) IN NACL 1000-0.9 UT/500ML-% IV SOLN
INTRAVENOUS | Status: DC | PRN
  Administered 2022-11-30 (×2): 500 mL

## 2022-11-30 MED ORDER — SODIUM CHLORIDE 0.9% FLUSH: 3.0000 mL | INTRAVENOUS | Status: DC | PRN

## 2022-11-30 MED ORDER — SODIUM CHLORIDE 0.9 % IV SOLN: 250.0000 mL | INTRAVENOUS | Status: DC | PRN

## 2022-11-30 MED ORDER — FENTANYL CITRATE PF 50 MCG/ML IJ SOSY: 25.0000 ug | PREFILLED_SYRINGE | Freq: Once | INTRAMUSCULAR | Status: DC

## 2022-11-30 MED ORDER — SODIUM CHLORIDE 0.9% FLUSH
3.0000 mL | Freq: Two times a day (BID) | INTRAVENOUS | Status: DC
  Administered 2022-12-01 – 2022-12-03 (×3): 3 mL via INTRAVENOUS

## 2022-11-30 MED ORDER — NITROGLYCERIN 0.4 MG SL SUBL
0.4000 mg | SUBLINGUAL_TABLET | SUBLINGUAL | Status: DC | PRN
  Administered 2022-11-30 (×2): 0.4 mg via SUBLINGUAL
  Filled 2022-11-30 (×2): qty 1

## 2022-11-30 MED ORDER — INSULIN ASPART PROT & ASPART (70-30 MIX) 100 UNIT/ML PEN
40.0000 [IU] | PEN_INJECTOR | Freq: Two times a day (BID) | SUBCUTANEOUS | Status: DC
  Administered 2022-12-01 – 2022-12-04 (×7): 40 [IU] via SUBCUTANEOUS
  Filled 2022-11-30: qty 3

## 2022-11-30 MED ORDER — NITROGLYCERIN 0.4 MG SL SUBL: 0.4000 mg | SUBLINGUAL_TABLET | SUBLINGUAL | Status: DC | PRN

## 2022-11-30 MED ORDER — METOPROLOL SUCCINATE ER 50 MG PO TB24
150.0000 mg | ORAL_TABLET | Freq: Every day | ORAL | Status: DC
  Administered 2022-12-01 – 2022-12-04 (×4): 150 mg via ORAL
  Filled 2022-11-30 (×4): qty 1

## 2022-11-30 MED ORDER — LIDOCAINE HCL (PF) 1 % IJ SOLN
INTRAMUSCULAR | Status: AC
  Filled 2022-11-30: qty 30

## 2022-11-30 MED ORDER — LIDOCAINE HCL (PF) 1 % IJ SOLN
INTRAMUSCULAR | Status: DC | PRN
  Administered 2022-11-30: 2 mL

## 2022-11-30 MED ORDER — INSULIN ASPART 100 UNIT/ML IJ SOLN
0.0000 [IU] | Freq: Three times a day (TID) | INTRAMUSCULAR | Status: DC
  Administered 2022-12-01 (×2): 5 [IU] via SUBCUTANEOUS
  Administered 2022-12-02 (×2): 2 [IU] via SUBCUTANEOUS
  Administered 2022-12-02: 3 [IU] via SUBCUTANEOUS
  Administered 2022-12-03: 5 [IU] via SUBCUTANEOUS
  Administered 2022-12-03: 2 [IU] via SUBCUTANEOUS
  Administered 2022-12-04: 3 [IU] via SUBCUTANEOUS

## 2022-11-30 MED ORDER — VERAPAMIL HCL 2.5 MG/ML IV SOLN
INTRAVENOUS | Status: AC
  Filled 2022-11-30: qty 2

## 2022-11-30 MED ORDER — SODIUM CHLORIDE 0.9 % WEIGHT BASED INFUSION
1.0000 mL/kg/h | INTRAVENOUS | Status: AC
  Administered 2022-11-30: 1.003 mL/kg/h via INTRAVENOUS

## 2022-11-30 MED ORDER — CLOPIDOGREL BISULFATE 300 MG PO TABS
ORAL_TABLET | ORAL | Status: AC
  Filled 2022-11-30: qty 2

## 2022-11-30 MED ORDER — HYDRALAZINE HCL 20 MG/ML IJ SOLN
10.0000 mg | INTRAMUSCULAR | Status: AC | PRN
  Administered 2022-11-30: 10 mg via INTRAVENOUS
  Filled 2022-11-30 (×2): qty 1

## 2022-11-30 MED ORDER — ASPIRIN 81 MG PO CHEW
81.0000 mg | CHEWABLE_TABLET | Freq: Every day | ORAL | Status: DC
  Administered 2022-12-01 – 2022-12-04 (×4): 81 mg via ORAL
  Filled 2022-11-30 (×4): qty 1

## 2022-11-30 MED ORDER — CLOPIDOGREL BISULFATE 300 MG PO TABS
ORAL_TABLET | ORAL | Status: DC | PRN
  Administered 2022-11-30: 600 mg via ORAL

## 2022-11-30 MED ORDER — ISOSORBIDE MONONITRATE ER 60 MG PO TB24
60.0000 mg | ORAL_TABLET | Freq: Every day | ORAL | Status: DC
  Administered 2022-12-01 – 2022-12-04 (×4): 60 mg via ORAL
  Filled 2022-11-30 (×4): qty 1

## 2022-11-30 MED ORDER — VERAPAMIL HCL 2.5 MG/ML IV SOLN
INTRAVENOUS | Status: DC | PRN
  Administered 2022-11-30: 10 mL via INTRA_ARTERIAL

## 2022-11-30 MED ORDER — KETOROLAC TROMETHAMINE 0.5 % OP SOLN
1.0000 [drp] | Freq: Three times a day (TID) | OPHTHALMIC | Status: DC
  Filled 2022-11-30: qty 5

## 2022-11-30 MED ORDER — APIXABAN 5 MG PO TABS
5.0000 mg | ORAL_TABLET | Freq: Two times a day (BID) | ORAL | Status: DC
  Administered 2022-12-01 – 2022-12-04 (×7): 5 mg via ORAL
  Filled 2022-11-30 (×7): qty 1

## 2022-11-30 SURGICAL SUPPLY — 26 items
BALLN EMERGE MR 2.0X12 (BALLOONS) ×1
BALLN EMERGE MR 2.5X12 (BALLOONS) ×1
BALLN ~~LOC~~ EMERGE MR 4.0X12 (BALLOONS) ×1
BALLOON EMERGE MR 2.0X12 (BALLOONS) IMPLANT
BALLOON EMERGE MR 2.5X12 (BALLOONS) IMPLANT
BALLOON ~~LOC~~ EMERGE MR 4.0X12 (BALLOONS) IMPLANT
CATH INFINITI 5FR AL1 (CATHETERS) IMPLANT
CATH INFINITI JR4 5F (CATHETERS) IMPLANT
CATH OPTICROSS HD (CATHETERS) IMPLANT
CATH VISTA GUIDE 6FR AL1 (CATHETERS) IMPLANT
CATH VISTA GUIDE 6FR XBLAD3.5 (CATHETERS) IMPLANT
DEVICE RAD COMP TR BAND LRG (VASCULAR PRODUCTS) IMPLANT
ELECT DEFIB PAD ADLT CADENCE (PAD) IMPLANT
GLIDESHEATH SLEND SS 6F .021 (SHEATH) IMPLANT
GUIDEWIRE INQWIRE 1.5J.035X260 (WIRE) IMPLANT
INQWIRE 1.5J .035X260CM (WIRE) ×1
KIT ENCORE 26 ADVANTAGE (KITS) IMPLANT
PACK CARDIAC CATHETERIZATION (CUSTOM PROCEDURE TRAY) ×1 IMPLANT
PROTECTION STATION PRESSURIZED (MISCELLANEOUS) ×1
SET ATX-X65L (MISCELLANEOUS) IMPLANT
SLED PULL BACK IVUS (MISCELLANEOUS) IMPLANT
STATION PROTECTION PRESSURIZED (MISCELLANEOUS) IMPLANT
STENT SYNERGY XD 3.50X12 (Permanent Stent) IMPLANT
SYNERGY XD 3.50X12 (Permanent Stent) ×1 IMPLANT
WIRE ASAHI PROWATER 180CM (WIRE) IMPLANT
WIRE FIGHTER CROSSING 190CM (WIRE) IMPLANT

## 2022-11-30 NOTE — H&P (Signed)
Cardiology Admission History and Physical    Patient ID: Dawn Wood MRN: [id number]; DOB: 09/07/1945    Admission date: (Not on file)   PCP:  Dorothyann Peng, MD              [facility] Providers Cardiologist:  Previously Dr. Katrinka Blazing   Chief Complaint:  Chest pain/STEMI   Patient Profile:    Dawn Wood is a 77 y.o. female with CAD status post NSTEMI with PCI/DES to LAD '15, hypertension, hyperlipidemia, peripheral vascular disease, diabetes, paroxysmal atrial fibrillation, arthritis, OSA who is being seen 11/30/2022 for the evaluation of STEMI/chest pain.   History of Present Illness:    Dawn Wood is a 77 year old female with past medical history noted above.  Previously followed by Dr. Katrinka Blazing before his retirement.  Underwent cardiac catheterization 05/2013 in the setting of a non-STEMI.  Received DES to LAD with 60% mid RCA, 40% mid circumflex.  Echocardiogram 03/2022 with LVEF of 65 to 70%, no regional wall motion abnormality, moderate LVH, moderately dilated left atrium no significant valvular disease.   Seen in the office 10/2022 for 32-month follow-up and reported ongoing shortness of breath and palpitations along with chest discomfort.  She had been using sublingual nitroglycerin.  Was started on Imdur and discussed the possibility of completing a outpatient Myoview if chest discomfort continued.  Seen back in follow-up on 9/19 and reported continued episodes of chest discomfort.  Her Imdur was increased to 60 mg daily and continued on Toprol XL 150 mg daily as well as Repatha.  It was recommended that she undergo repeat outpatient Lexiscan if pain continued with increase in Imdur.   She reports being at a car dealership today and got upset. Developed centralized chest pain, nausea and vomited in the trach can[facility] was called and serial EKG with progressive ST elevation in anterior leads. CODE STEMI called, given 324mg  ASA, Zofran and 1 SL  nitroglycerin. On arrival, still complaining of 7/10 chest pain. Taken emergently to the cath lab.           Past Medical History:  Diagnosis Date   Coronary artery disease     Diabetes mellitus without complication (HCC)     GERD (gastroesophageal reflux disease)     Hypertension     MI (myocardial infarction) (HCC)     Peripheral vascular disease (HCC)                 Past Surgical History:  Procedure Laterality Date   ABDOMINAL HYSTERECTOMY   1985   APPENDECTOMY       CATARACT EXTRACTION Left 02/20/2018    Dr. Harlon Flor   CATARACT EXTRACTION Bilateral 2024   cataract surgery Right 01/25/2022    Second at [facility] 11/29   CORONARY STENT PLACEMENT   2015   ectopic pregnancy--unilateral salpingectomy   1980   KNEE ARTHROSCOPY Left 03/2016   unilateral oophorectomy              Medications Prior to Admission:        Prior to Admission medications   Medication Sig Start Date End Date Taking? Authorizing Provider  Accu-Chek Softclix Lancets lancets USE TO CHECK BLOOD SUGARS ONCE DAILY. 05/26/22     Dorothyann Peng, MD  apixaban (ELIQUIS) 5 MG TABS tablet Take 1 tablet (5 mg total) by mouth 2 (two) times daily. 04/09/22     Sharlene Dory, PA-C  BD PEN NEEDLE NANO 2ND GEN 32G X  4 MM MISC USE AS DIRECTED TWICE DAILY 08/12/22     Dorothyann Peng, MD  Blood Glucose Monitoring Suppl (ACCU-CHEK GUIDE) w/Device KIT USE TO CHECK BLOOD SUGARS ONCE DAILY. 05/26/22     Dorothyann Peng, MD  chlorthalidone (HYGROTON) 25 MG tablet Take 1 tablet (25 mg total) by mouth daily. 10/11/22     Meriam Sprague, MD  Cholecalciferol (VITAMIN D3) 2000 units TABS Take 1 tablet by mouth daily.       [provider]  colchicine 0.6 MG tablet Take 1 tablet (0.6 mg total) by mouth 2 (two) times daily. 03/19/22     Sharlene Dory, PA-C  dorzolamide-timolol (COSOPT) 2-0.5 % ophthalmic solution Place 1 drop into the right eye 2 (two) times daily.       [provider]  Evolocumab (REPATHA SURECLICK)  140 MG/ML SOAJ Inject 140 mg into the skin every 14 (fourteen) days. 08/12/22     Meriam Sprague, MD  fluticasone (FLONASE) 50 MCG/ACT nasal spray SHAKE LIQUID AND USE 1 SPRAY IN EACH NOSTRIL DAILY AS NEEDED FOR ALLERGIES OR RHINITIS 10/12/21     Dorothyann Peng, MD  furosemide (LASIX) 20 MG tablet Take 20 mg by mouth as needed for edema or fluid. Pt takes 1 tablet 20 mg as PRN.       [provider]  glucose blood (ACCU-CHEK GUIDE) test strip USE TO CHECK BLOOD SUGARS ONCE DAILY. 05/21/22     Dorothyann Peng, MD  insulin aspart protamine - aspart (NOVOLOG MIX 70/30 FLEXPEN) (70-30) 100 UNIT/ML FlexPen INJECT 40 UNITS SUBCUTANEOUS EVERY MORNING AND 50 UNITS SUBCUTANEOUS AT [facility] 05/04/22     Dorothyann Peng, MD  isosorbide mononitrate (IMDUR) 60 MG 24 hr tablet Take 1 tablet (60 mg total) by mouth daily. Pt takes 1 tablet by mouth daily. 11/25/22     Gaston Islam., NP  ketorolac (ACULAR) 0.5 % ophthalmic solution Place 1 drop into the right eye 3 (three) times daily. 07/18/22     [provider]  metoprolol succinate (TOPROL-XL) 100 MG 24 hr tablet Take 1.5 tablets (150 mg total) by mouth daily. Take with or immediately following a meal. 10/25/22 01/23/23   Gaston Islam., NP  nitroGLYCERIN (NITROSTAT) 0.4 MG SL tablet Place 1 tablet (0.4 mg total) under the tongue every 5 (five) minutes as needed for chest pain. 08/30/22     Meriam Sprague, MD  olmesartan (BENICAR) 40 MG tablet TAKE 1 TABLET(40 MG) BY MOUTH DAILY 08/17/22     Dorothyann Peng, MD  [facility] VERIO test strip USE TO CHECK BLOOD SUGARS TWICE DAILY AS DIRECTED 05/18/22     Dorothyann Peng, MD  Semaglutide,0.25 or 0.5MG /DOS, (OZEMPIC, 0.25 OR 0.5 MG/DOSE,) 2 MG/1.5ML SOPN INJECT 0.5MG  INTO SKIN ONCE A WEEK 06/22/21     Dorothyann Peng, MD  sodium chloride (MURO 128) 5 % ophthalmic solution Place 1 drop into the right eye 2 (two) times daily. 09/04/22 09/04/23   [provider]  spironolactone (ALDACTONE) 25 MG  tablet Take 1 tablet (25 mg total) by mouth daily. 10/22/22     Gaston Islam., NP  traMADol Janean Sark) 50 MG tablet Take 50 mg by mouth daily at 6 (six) AM. 06/07/22     [provider]  VYZULTA 0.024 % SOLN Apply 1 drop to eye in the morning, at noon, and at bedtime. 07/22/22     [provider]      Allergies:    Allergies  Allergies  Allergen Reactions   Dust Mite Extract Other (See Comments)      REACTION: SNEEZING   Other Other (See Comments)      ALLERGEN: DEODORIZERS SPRAY AND PERFUMES REACTION: SNEEZING   Statins Other (See Comments)      Muscle aches with rosuvastatin 5 mg daily and 3x/weekly, atorvastatin 20 mg daily and 3x/week, simvastatin 40 mg daily     Allopurinol        Diarrhea and the pt said it made her sugars low   Pollen Extract Other (See Comments)      Reaction unknown        Social History:   Social History         Socioeconomic History   Marital status: Single      Spouse name: Not on file   Number of children: 2   Years of education: Not on file   Highest education level: Not on file  Occupational History   Not on file  Tobacco Use   Smoking status: Former      Current packs/day: 0.00      Average packs/day: 0.3 packs/day for 45.0 years (11.3 ttl pk-yrs)      Types: Cigarettes      Start date: 01/11/1968      Quit date: 01/10/2013      Years since quitting: 9.8   Smokeless tobacco: Former  Building services engineer status: Never Used  Substance and Sexual Activity   Alcohol use: Not Currently      Alcohol/week: 0.0 standard drinks of alcohol   Drug use: No   Sexual activity: Not Currently  Other Topics Concern   Not on file  Social History Narrative   Not on file    Social Determinants of Health        Financial Resource Strain: Low Risk  (10/21/2022)    Overall Financial Resource Strain (CARDIA)     Difficulty of Paying Living Expenses: Not hard at all  Food Insecurity: No Food Insecurity (10/21/2022)    Hunger  Vital Sign     Worried About Running Out of Food in the Last Year: Never true     Ran Out of Food in the Last Year: Never true  Transportation Needs: No Transportation Needs (10/21/2022)    PRAPARE - Therapist, art (Medical): No     Lack of Transportation (Non-Medical): No  Physical Activity: Inactive (10/21/2022)    Exercise Vital Sign     Days of Exercise per Week: 0 days     Minutes of Exercise per Session: 0 min  Stress: No Stress Concern Present (10/21/2022)    [employer] of Occupational Health - Occupational Stress Questionnaire     Feeling of Stress : Not at all  Social Connections: Socially Isolated (10/21/2022)    Social Connection and Isolation Panel [NHANES]     Frequency of Communication with Friends and Family: More than three times a week     Frequency of Social Gatherings with Friends and Family: Twice a week     Attends Religious Services: Never     Database administrator or Organizations: No     Attends Banker Meetings: Never     Marital Status: Never married  Intimate Partner Violence: Not At Risk (10/21/2022)    Humiliation, Afraid, Rape, and Kick questionnaire     Fear of Current or Ex-Partner: No     Emotionally Abused: No  Physically Abused: No     Sexually Abused: No    Family History:   The patient's family history includes Aortic stenosis in her daughter; Breast cancer (age of onset: 70) in her sister; Cancer in her father; Heart disease in her daughter; Hypertension in her father and mother; Thyroid disease in her daughter. There is no history of Sleep apnea.     ROS:  Please see the history of present illness.  All other ROS reviewed and negative.      Physical Exam/Data:  There were no vitals filed for this visit. No intake or output data in the 24 hours ending 11/30/22 1612     11/25/2022    2:16 PM 10/25/2022   11:20 AM 10/21/2022   11:35 AM  Last 3 Weights  Weight (lbs) 211 lb 213 lb 211 lb  Weight  (kg) 95.709 kg 96.616 kg 95.709 kg     There is no height or weight on file to calculate BMI.  General:  Well nourished, well developed, in no acute distress HEENT: normal Neck: no JVD Vascular: No carotid bruits; Distal pulses 2+ bilaterally   Cardiac:  normal S1, S2; RRR; no murmur  Lungs:  clear to auscultation bilaterally, no wheezing, rhonchi or rales  Abd: soft, nontender, no hepatomegaly  Ext: no edema Musculoskeletal:  No deformities, BUE and BLE strength normal and equal Skin: warm and dry  Neuro:  CNs 2-12 intact, no focal abnormalities noted   EKG:  The ECG that was done 9/24 was personally reviewed and demonstrates sinus rhythm with progressive ST elevation in anterior leads   Relevant CV Studies:   Echo: 03/2022   IMPRESSIONS     1. Left ventricular ejection fraction, by estimation, is 65 to 70%. The  left ventricle has normal function. The left ventricle has no regional  wall motion abnormalities. There is moderate concentric left ventricular  hypertrophy. Left ventricular  diastolic parameters are indeterminate.   2. Right ventricular systolic function is normal. The right ventricular  size is normal.   3. Left atrial size was mild to moderately dilated.   4. Right atrial size was mildly dilated.   5. The mitral valve was not well visualized. No evidence of mitral valve  regurgitation. No evidence of mitral stenosis.   6. The aortic valve was not well visualized. There is mild calcification  of the aortic valve. Aortic valve regurgitation is not visualized. No  aortic stenosis is present.   7. Aortic dilatation noted. There is borderline dilatation of the  ascending aorta, measuring 38 mm.   8. The inferior vena cava is normal in size with greater than 50%  respiratory variability, suggesting right atrial pressure of 3 mmHg.   Comparison(s): Prior images unable to be directly viewed, comparison made  by report only.   Conclusion(s)/Recommendation(s):  Technically challenging study--grossly  normal LVEF, but reduced sensitivity for regional wall motion  abnormalities.   FINDINGS   Left Ventricle: Left ventricular ejection fraction, by estimation, is 65  to 70%. The left ventricle has normal function. The left ventricle has no  regional wall motion abnormalities. The left ventricular internal cavity  size was normal in size. There is   moderate concentric left ventricular hypertrophy. Left ventricular  diastolic parameters are indeterminate.   Right Ventricle: The right ventricular size is normal. Right vetricular  wall thickness was not well visualized. Right ventricular systolic  function is normal.   Left Atrium: Left atrial size was mild to moderately dilated.  Right Atrium: Right atrial size was mildly dilated.   Pericardium: There is no evidence of pericardial effusion.   Mitral Valve: The mitral valve was not well visualized. There is mild  calcification of the mitral valve leaflet(s). No evidence of mitral valve  regurgitation. No evidence of mitral valve stenosis.   Tricuspid Valve: The tricuspid valve is not well visualized. Tricuspid  valve regurgitation is not demonstrated. No evidence of tricuspid  stenosis.   Aortic Valve: The aortic valve was not well visualized. There is mild  calcification of the aortic valve. Aortic valve regurgitation is not  visualized. No aortic stenosis is present.   Pulmonic Valve: The pulmonic valve was not well visualized. Pulmonic valve  regurgitation is not visualized.   Aorta: Aortic dilatation noted. There is borderline dilatation of the  ascending aorta, measuring 38 mm.   Venous: The inferior vena cava is normal in size with greater than 50%  respiratory variability, suggesting right atrial pressure of 3 mmHg.   IAS/Shunts: The interatrial septum was not well visualized.    Radiology/Studies:  No results found.   Assessment and Plan:    Dawn Wood is a 77  y.o. female with CAD status post NSTEMI with PCI/DES to LAD '15, hypertension, hyperlipidemia, peripheral vascular disease, diabetes, paroxysmal atrial fibrillation, arthritis, OSA who is being seen 11/30/2022 for the evaluation of STEMI/chest pain.   STEMI CAD s/p prior DES to LAD '15 -- was at the car dealership when she developed chest pain, nausea and vomited. EMS called, on arrival found to have ST elevation in anterior leads which became progressively worse while en route. Given 324 ASA and 1 SL NTG. Still with 7/10 on arrival. Taken emergently to the cath lab -- further recommendations pending cath. Anticipate routine post MI care  HTN -- blood pressures were soft en route with EMS, last reported 104 systolic -- Toprol 150mg , Imdur 60mg  daily, olmesartan 40mg  daily, spiro 25mg  daily, chlorthalidone 25mg  daily PTA  Paroxsymal atrial fibrillation -- on Eliquis 5mg  BID  -- sinus rhythm on admission  HLD -- statin intolerant, on repatha  DM -- Hgb A1c 6.9 (10/2022) -- SSI while inpatient  OSA -- on Cpap  Risk Assessment/Risk Scores:   TIMI Risk Score for ST  Elevation MI:   The patient's TIMI risk score is 5, which indicates a 12.4% risk of all cause mortality at 30 days.  Code Status: Full Code   Severity of Illness: The appropriate patient status for this patient is INPATIENT. Inpatient status is judged to be reasonable and necessary in order to provide the required intensity of service to ensure the patient's safety. The patient's presenting symptoms, physical exam findings, and initial radiographic and laboratory data in the context of their chronic comorbidities is felt to place them at high risk for further clinical deterioration. Furthermore, it is not anticipated that the patient will be medically stable for discharge from the hospital within 2 midnights of admission.   * I certify that at the point of admission it is my clinical judgment that the patient will require  inpatient hospital care spanning beyond 2 midnights from the point of admission due to high intensity of service, high risk for further deterioration and high frequency of surveillance required.*    For questions or updates, please contact [facility] Please consult [email or web address] for contact info under      Signed, Laverda Page, NP  11/30/2022 4:12 PM

## 2022-11-30 NOTE — Progress Notes (Signed)
   11/30/22 1700  Spiritual Encounters  Type of Visit Initial  Care provided to: Patient  Conversation partners present during encounter Nurse  Reason for visit Code  OnCall Visit No   Ch responded to code STEMI. There is no family present. Pt was en route to CT. Ch remains available when needed.

## 2022-11-30 NOTE — Telephone Encounter (Signed)
Sure thing, I think I might have already received an online approval through [employer], would you like me to call and cancel that assistance?

## 2022-12-01 ENCOUNTER — Other Ambulatory Visit (HOSPITAL_COMMUNITY): Payer: Self-pay

## 2022-12-01 ENCOUNTER — Encounter (HOSPITAL_COMMUNITY): Payer: Self-pay | Admitting: Cardiology

## 2022-12-01 ENCOUNTER — Inpatient Hospital Stay (HOSPITAL_COMMUNITY): Payer: Medicare PPO

## 2022-12-01 ENCOUNTER — Other Ambulatory Visit: Payer: Self-pay

## 2022-12-01 DIAGNOSIS — I2102 ST elevation (STEMI) myocardial infarction involving left anterior descending coronary artery: Secondary | ICD-10-CM | POA: Diagnosis not present

## 2022-12-01 LAB — LIPID PANEL
Cholesterol: 170 mg/dL (ref 0–200)
HDL: 36 mg/dL — ABNORMAL LOW (ref >40)
LDL Cholesterol: 109 mg/dL — ABNORMAL HIGH (ref 0–99)
Total CHOL/HDL Ratio: 4.7 RATIO
Triglycerides: 123 mg/dL (ref <150)
VLDL: 25 mg/dL (ref 0–40)

## 2022-12-01 LAB — CBC
HCT: 43.9 % (ref 36.0–46.0)
Hemoglobin: 14.1 g/dL (ref 12.0–15.0)
MCH: 27.4 pg (ref 26.0–34.0)
MCHC: 32.1 g/dL (ref 30.0–36.0)
MCV: 85.4 fL (ref 80.0–100.0)
Platelets: 168 10*3/uL (ref 150–400)
RBC: 5.14 MIL/uL — ABNORMAL HIGH (ref 3.87–5.11)
RDW: 14.6 % (ref 11.5–15.5)
WBC: 9.4 10*3/uL (ref 4.0–10.5)
nRBC: 0 % (ref 0.0–0.2)

## 2022-12-01 LAB — MAGNESIUM: Magnesium: 1.6 mg/dL — ABNORMAL LOW (ref 1.7–2.4)

## 2022-12-01 LAB — ECHOCARDIOGRAM COMPLETE
Area-P 1/2: 2.62 cm2
Calc EF: 63.8 %
MV M vel: 1.13 m/s
MV Peak grad: 5.1 mmHg
S' Lateral: 2.6 cm
Single Plane A2C EF: 57.3 %
Single Plane A4C EF: 69.2 %
Weight: 3185.21 oz

## 2022-12-01 LAB — GLUCOSE, CAPILLARY
Glucose-Capillary: 211 mg/dL — ABNORMAL HIGH (ref 70–99)
Glucose-Capillary: 94 mg/dL (ref 70–99)
Glucose-Capillary: 212 mg/dL — ABNORMAL HIGH (ref 70–99)
Glucose-Capillary: 120 mg/dL — ABNORMAL HIGH (ref 70–99)

## 2022-12-01 LAB — BASIC METABOLIC PANEL
Anion gap: 14 (ref 5–15)
BUN: 24 mg/dL — ABNORMAL HIGH (ref 8–23)
CO2: 24 mmol/L (ref 22–32)
Calcium: 9 mg/dL (ref 8.9–10.3)
Chloride: 99 mmol/L (ref 98–111)
Creatinine, Ser: 1.26 mg/dL — ABNORMAL HIGH (ref 0.44–1.00)
GFR, Estimated: 44 mL/min — ABNORMAL LOW (ref >60)
Glucose, Bld: 170 mg/dL — ABNORMAL HIGH (ref 70–99)
Potassium: 4 mmol/L (ref 3.5–5.1)
Sodium: 137 mmol/L (ref 135–145)

## 2022-12-01 MED ORDER — CHLORHEXIDINE GLUCONATE CLOTH 2 % EX PADS
6.0000 | MEDICATED_PAD | Freq: Every day | CUTANEOUS | Status: DC
  Administered 2022-12-01: 6 via TOPICAL

## 2022-12-01 MED ORDER — MAGNESIUM SULFATE 4 GM/100ML IV SOLN
4.0000 g | Freq: Once | INTRAVENOUS | Status: AC
  Administered 2022-12-01: 4 g via INTRAVENOUS
  Filled 2022-12-01: qty 100

## 2022-12-01 NOTE — Telephone Encounter (Signed)
We received a fax from [facility] stating they have been unable to contact patient regarding pap setup. I see the patient is currently in the ICU with a STEMI.   Mychart message sent to patient.

## 2022-12-01 NOTE — Plan of Care (Signed)
Problem: Education: Goal: Knowledge of General Education information will improve Description: Including pain rating scale, medication(s)/side effects and non-pharmacologic comfort measures Outcome: Progressing   Problem: Health Behavior/Discharge Planning: Goal: Ability to manage health-related needs will improve Outcome: Progressing   Problem: Clinical Measurements: Goal: Ability to maintain clinical measurements within normal limits will improve Outcome: Progressing Goal: Will remain free from infection Outcome: Progressing Goal: Diagnostic test results will improve Outcome: Progressing Goal: Respiratory complications will improve Outcome: Progressing Goal: Cardiovascular complication will be avoided Outcome: Progressing   Problem: Activity: Goal: Risk for activity intolerance will decrease Outcome: Progressing   Problem: Nutrition: Goal: Adequate nutrition will be maintained Outcome: Progressing   Problem: Coping: Goal: Level of anxiety will decrease Outcome: Progressing   Problem: Elimination: Goal: Will not experience complications related to bowel motility Outcome: Progressing Goal: Will not experience complications related to urinary retention Outcome: Progressing   Problem: Pain Managment: Goal: General experience of comfort will improve Outcome: Progressing   Problem: Safety: Goal: Ability to remain free from injury will improve Outcome: Progressing   Problem: Skin Integrity: Goal: Risk for impaired skin integrity will decrease Outcome: Progressing   Problem: Education: Goal: Ability to describe self-care measures that may prevent or decrease complications (Diabetes Survival Skills Education) will improve Outcome: Progressing Goal: Individualized Educational Video(s) Outcome: Progressing   Problem: Coping: Goal: Ability to adjust to condition or change in health will improve Outcome: Progressing   Problem: Fluid Volume: Goal: Ability to  maintain a balanced intake and output will improve Outcome: Progressing   Problem: Health Behavior/Discharge Planning: Goal: Ability to identify and utilize available resources and services will improve Outcome: Progressing Goal: Ability to manage health-related needs will improve Outcome: Progressing   Problem: Metabolic: Goal: Ability to maintain appropriate glucose levels will improve Outcome: Progressing   Problem: Nutritional: Goal: Maintenance of adequate nutrition will improve Outcome: Progressing Goal: Progress toward achieving an optimal weight will improve Outcome: Progressing   Problem: Skin Integrity: Goal: Risk for impaired skin integrity will decrease Outcome: Progressing   Problem: Tissue Perfusion: Goal: Adequacy of tissue perfusion will improve Outcome: Progressing   Problem: Education: Goal: Understanding of CV disease, CV risk reduction, and recovery process will improve Outcome: Progressing Goal: Individualized Educational Video(s) Outcome: Progressing   Problem: Activity: Goal: Ability to return to baseline activity level will improve Outcome: Progressing   Problem: Cardiovascular: Goal: Ability to achieve and maintain adequate cardiovascular perfusion will improve Outcome: Progressing Goal: Vascular access site(s) Level 0-1 will be maintained Outcome: Progressing   Problem: Health Behavior/Discharge Planning: Goal: Ability to safely manage health-related needs after discharge will improve Outcome: Progressing   Problem: Education: Goal: Understanding of cardiac disease, CV risk reduction, and recovery process will improve Outcome: Progressing Goal: Individualized Educational Video(s) Outcome: Progressing   Problem: Activity: Goal: Ability to tolerate increased activity will improve Outcome: Progressing   Problem: Cardiac: Goal: Ability to achieve and maintain adequate cardiovascular perfusion will improve Outcome: Progressing    Problem: Health Behavior/Discharge Planning: Goal: Ability to safely manage health-related needs after discharge will improve Outcome: Progressing

## 2022-12-01 NOTE — Progress Notes (Signed)
CARDIAC REHAB PHASE I   PRE:  Rate/Rhythm: 63 SR  BP:  Sitting: 135/72      SaO2: 92 3l/[place]  MODE:  Ambulation: 0 ft   POST:  Rate/Rhythm: 78 SR  BP:  Sitting: 121/63     SaO2: 93 3l/[place]  Pt assisted to side of bed, able to stay at bedside with mod assist. Pt c/o of fluttering  and pressure in chest with activity. Pt was only able to stay for short time then back to bed.  Pt c/o of generalized weakness and fatigue. Unable to ambulate at this time. Pt reports chest pressure is better at rest. Post MI/ stent education including site care, restrictions, risk factors, MI booklet, exercise guidelines, NTG use( pt reports she is on medication that prohibits NTG use) antiplatelet therapy importance, heart healthy diabetic diet and CRP2 reviewed. All questions and concerns addressed. Will refer to [facility] for CRP2. Will continue to follow.   [id number] Woodroe Chen, RN BSN 12/01/2022 10:15 AM

## 2022-12-01 NOTE — Progress Notes (Signed)
  Echocardiogram 2D Echocardiogram has been performed.  Maren Reamer 12/01/2022, 11:43 AM

## 2022-12-01 NOTE — Telephone Encounter (Signed)
Noted, thank you, [place].

## 2022-12-01 NOTE — Progress Notes (Addendum)
   Patient Name: Dawn Wood Eye Date of Encounter: 12/01/2022 [facility] Cardiologist: [place]  Interval Summary  .    Had an episode of chest pain last night.  EKG without ischemic changes>>received NTG and pain meds.  This AM, no chest pain or dyspnea.  Feels well  Vital Signs .    Vitals:   12/01/22 0500 12/01/22 0600 12/01/22 0700 12/01/22 0804  BP: (!) 142/73 (!) 165/75 (!) 145/76   Pulse: (!) 56 (!) 54 (!) 54   Resp: 14 13 12    Temp:    98.4 F (36.9 C)  TempSrc:    Axillary  SpO2: 95% 97% 93%   Weight:        Intake/Output Summary (Last 24 hours) at 12/01/2022 0844 Last data filed at 12/01/2022 8295 Gross per 24 hour  Intake 720 ml  Output 1000 ml  Net -280 ml      12/01/2022    2:56 AM 11/25/2022    2:16 PM 10/25/2022   11:20 AM  Last 3 Weights  Weight (lbs) 199 lb 1.2 oz 211 lb 213 lb  Weight (kg) 90.3 kg 95.709 kg 96.616 kg      Telemetry/ECG    SR with occ NSVT - Personally Reviewed  Physical Exam .   GEN: No acute distress.   Neck: No JVD Cardiac: RRR, no murmurs, rubs, or gallops.  Respiratory: Clear to auscultation bilaterally. GI: Soft, nontender, non-distended  MS: No edema  Assessment & Plan .      STEMI:  s/p PCI of pLAD with POBA RCA.  Cont ASA/plavix/Eliquis x 1 month then plavix and Eliquis x 1 year.  Cont metoprolol.  Follow up TTE today.  Transfer out of unit today, possible d/c in AM NSVT:  Replete Mg today, cont HL:  On Repatha as outpatient T2DM:  Cont irbesartan, ASA, Repatha; consider SGLTi as outpatient. PAF:  Cont Eliquis.  For questions or updates, please contact [facility] Please consult [email or web address] for contact info under        Signed, Orbie Pyo, MD

## 2022-12-02 ENCOUNTER — Ambulatory Visit: Payer: Medicare PPO

## 2022-12-02 ENCOUNTER — Other Ambulatory Visit (HOSPITAL_COMMUNITY): Payer: Self-pay

## 2022-12-02 DIAGNOSIS — I2102 ST elevation (STEMI) myocardial infarction involving left anterior descending coronary artery: Secondary | ICD-10-CM | POA: Diagnosis not present

## 2022-12-02 LAB — BASIC METABOLIC PANEL
Anion gap: 10 (ref 5–15)
BUN: 41 mg/dL — ABNORMAL HIGH (ref 8–23)
CO2: 23 mmol/L (ref 22–32)
Calcium: 8.3 mg/dL — ABNORMAL LOW (ref 8.9–10.3)
Chloride: 102 mmol/L (ref 98–111)
Creatinine, Ser: 2.01 mg/dL — ABNORMAL HIGH (ref 0.44–1.00)
GFR, Estimated: 25 mL/min — ABNORMAL LOW (ref >60)
Glucose, Bld: 146 mg/dL — ABNORMAL HIGH (ref 70–99)
Potassium: 3.6 mmol/L (ref 3.5–5.1)
Sodium: 135 mmol/L (ref 135–145)

## 2022-12-02 LAB — GLUCOSE, CAPILLARY
Glucose-Capillary: 166 mg/dL — ABNORMAL HIGH (ref 70–99)
Glucose-Capillary: 122 mg/dL — ABNORMAL HIGH (ref 70–99)
Glucose-Capillary: 141 mg/dL — ABNORMAL HIGH (ref 70–99)
Glucose-Capillary: 146 mg/dL — ABNORMAL HIGH (ref 70–99)

## 2022-12-02 LAB — MAGNESIUM: Magnesium: 2.6 mg/dL — ABNORMAL HIGH (ref 1.7–2.4)

## 2022-12-02 LAB — CBC
HCT: 41.7 % (ref 36.0–46.0)
Hemoglobin: 13.1 g/dL (ref 12.0–15.0)
MCH: 27.2 pg (ref 26.0–34.0)
MCHC: 31.4 g/dL (ref 30.0–36.0)
MCV: 86.7 fL (ref 80.0–100.0)
Platelets: 212 10*3/uL (ref 150–400)
RBC: 4.81 MIL/uL (ref 3.87–5.11)
RDW: 14.3 % (ref 11.5–15.5)
WBC: 8.7 10*3/uL (ref 4.0–10.5)
nRBC: 0 % (ref 0.0–0.2)

## 2022-12-02 MED ORDER — SODIUM CHLORIDE 0.9 % IV SOLN: INTRAVENOUS | Status: AC

## 2022-12-02 NOTE — Progress Notes (Signed)
CARDIAC REHAB PHASE I   PRE:  Rate/Rhythm: 64 SR  BP:  Sitting: 140/68      SaO2: 96 RA  MODE:  Ambulation: 150 ft   POST:  Rate/Rhythm: 88 SR   BP:  Sitting: 161/70      SaO2: 97 RA  Pt ambulated in hallway using front wheel walker. Tolerated well with no CP,SOB or dizziness. Returned to chair with call bell and bedside table in reach. Cardiac rehab education complete, no questions or concerns. Will continue to follow.   [id number] Woodroe Chen, RN BSN 12/02/2022 9:49 AM

## 2022-12-02 NOTE — TOC Benefit Eligibility Note (Signed)
Pharmacy Patient Advocate Encounter  Insurance verification completed.    The patient is insured through [employer] test claim for Eliquis. Currently a quantity of 60 is a 30 day supply and the co-pay is $40.00 .  Ran test claim for [place]. Currently a quantity of 30 is a 30 day supply and the co-pay is $64.00 .   This test claim was processed through [facility]- copay amounts may vary at other pharmacies due to pharmacy/plan contracts, or as the patient moves through the different stages of their insurance plan.

## 2022-12-02 NOTE — Plan of Care (Signed)
  Problem: Education: Goal: Knowledge of General Education information will improve Description: Including pain rating scale, medication(s)/side effects and non-pharmacologic comfort measures Outcome: Progressing   Problem: Health Behavior/Discharge Planning: Goal: Ability to manage health-related needs will improve Outcome: Progressing   Problem: Pain Managment: Goal: General experience of comfort will improve Outcome: Progressing   Problem: Safety: Goal: Ability to remain free from injury will improve Outcome: Progressing   Problem: Skin Integrity: Goal: Risk for impaired skin integrity will decrease Outcome: Progressing   Problem: Health Behavior/Discharge Planning: Goal: Ability to identify and utilize available resources and services will improve Outcome: Progressing Goal: Ability to manage health-related needs will improve Outcome: Progressing   Problem: Education: Goal: Understanding of CV disease, CV risk reduction, and recovery process will improve Outcome: Progressing   Problem: Cardiovascular: Goal: Vascular access site(s) Level 0-1 will be maintained Outcome: Progressing

## 2022-12-02 NOTE — TOC CM/SW Note (Signed)
Transition of Care [facility]) - Inpatient Brief Assessment   Patient Details  Name: Dawn Wood MRN: [id number] Date of Birth: [birth year]  [facility] of [facility] CM/SW Contact:    Gala Lewandowsky, RN Phone Number: 12/02/2022, 4:38 PM   Clinical Narrative: Patient presented for [facility]- post LHC. Patient has insurance and PCP. Benefits check submitted for Eliquis/ Farxiga. Case Manager will continue to follow for transition of care needs as the patient progresses.    Transition of Care Asessment: Insurance and Status: Insurance coverage has been reviewed Patient has primary care physician: Yes  Prior/Current Home Services: No current home services Social Determinants of Health Reivew: SDOH reviewed no interventions necessary Readmission risk has been reviewed: Yes Transition of care needs: no transition of care needs at this time

## 2022-12-02 NOTE — Progress Notes (Addendum)
Patient Name: Dawn Wood Date of Encounter: 12/02/2022 [facility] Cardiologist: None   Interval Summary  .    Feeling well this morning. No chest pain. Weaned from [place] to RA.   Vital Signs .    Vitals:   12/01/22 1300 12/01/22 1719 12/01/22 2105 12/02/22 0456  BP: 101/60  102/71 138/71  Pulse: (!) 59  (!) 54 (!) 58  Resp: 16  18 18   Temp: 98.1 F (36.7 C)  98.3 F (36.8 C) 98.5 F (36.9 C)  TempSrc: Oral  Oral Oral  SpO2: 93% 97% 94% 100%  Weight:        Intake/Output Summary (Last 24 hours) at 12/02/2022 0757 Last data filed at 12/01/2022 1230 Gross per 24 hour  Intake 340.07 ml  Output 600 ml  Net -259.93 ml      12/01/2022    2:56 AM 11/25/2022    2:16 PM 10/25/2022   11:20 AM  Last 3 Weights  Weight (lbs) 199 lb 1.2 oz 211 lb 213 lb  Weight (kg) 90.3 kg 95.709 kg 96.616 kg      Telemetry/ECG    Sinus Rhythm - Personally Reviewed  Physical Exam .   GEN: No acute distress.   Neck: No JVD Cardiac: RRR, no murmurs, rubs, or gallops.  Respiratory: Clear to auscultation bilaterally. GI: Soft, nontender, non-distended  MS: No edema. Right radial cath site stable.   Assessment & Plan .     77 y.o. female with CAD status post NSTEMI with PCI/DES to LAD '15, hypertension, hyperlipidemia, peripheral vascular disease, diabetes, paroxysmal atrial fibrillation, arthritis, OSA who is being seen 11/30/2022 for the evaluation of STEMI/chest pain.   STEMI -- Underwent cardiac catheterization 9/24 with successful PCI of proximal LAD with DES x 1.  Attempted PCI of mid RCA, is able to cross with wire but balloon angioplasty was performed with minimal flow.  Unable to deliver a stent.  Recommendations for triple therapy with aspirin, Plavix and Eliquis for 1 month.  After 1 month stop aspirin.  If refractory angina would consider CTO of the RCA from femoral approach.  Echocardiogram with LVEF of 60 to 65%, severe LVH, grade 1 diastolic dysfunction, normal  RV, trivial MR. -- No recurrent chest pain, cardiac rehab following -- Continue aspirin, Plavix, metoprolol succinate 150 mg daily, Imdur 60 mg daily  NSVT -- Resolved, mag 2.6, potassium 3.6 -- Continue metoprolol XL 150 mg daily  HLD -- LDL 109, HDL 36 -- History of statin intolerance, plan for lipid clinic referral for PCSK9 as an outpatient  DM -- Hemoglobin A1c 7.1 -- On NovoLog 70/30 mix, 40 units twice daily, along with SSI  Paroxysmal atrial fibrillation -- remains in sinus rhythm -- Continue metoprolol XL, Eliquis 5 mg twice daily  AKI -- Creatinine up from 1.2-2 -- Hold irbesartan, spironolactone -- Give IV fluids 75 mg/hr x 5 hours -- Repeat BMET in a.m.  For questions or updates, please contact [facility] Please consult [email or web address] for contact info under   Signed, Laverda Page, NP     ATTENDING ATTESTATION:  After conducting a review of all available clinical information with the care team, interviewing the patient, and performing a physical exam, I agree with the findings and plan described in this note.   GEN: No acute distress.   HEENT:  MMM, no JVD, no scleral icterus Cardiac: RRR, no murmurs, rubs, or gallops.  Respiratory: Clear to auscultation bilaterally. GI: Soft, nontender, non-distended  MS: No  edema; No deformity. Neuro:  Nonfocal  Vasc:  +2 radial pulses  Patient improved today with no recurrent chest pain.  Unfortunately her creatinine is increased.  Agree with judicious IV fluids.  The patient looks euvolemic.  Agree with holding irbesartan and spironolactone.  Hopefully her kidney function will improve over the next day or so.  Continue current management with with triple therapy as detailed above.  If creatinine improved tomorrow would start SGLT2 inhibitor.  Alverda Skeans, MD Pager [phone]

## 2022-12-03 ENCOUNTER — Other Ambulatory Visit (HOSPITAL_COMMUNITY): Payer: Self-pay

## 2022-12-03 DIAGNOSIS — I2102 ST elevation (STEMI) myocardial infarction involving left anterior descending coronary artery: Secondary | ICD-10-CM | POA: Diagnosis not present

## 2022-12-03 DIAGNOSIS — I480 Paroxysmal atrial fibrillation: Secondary | ICD-10-CM | POA: Insufficient documentation

## 2022-12-03 LAB — BASIC METABOLIC PANEL
Anion gap: 10 (ref 5–15)
BUN: 34 mg/dL — ABNORMAL HIGH (ref 8–23)
CO2: 21 mmol/L — ABNORMAL LOW (ref 22–32)
Calcium: 8.8 mg/dL — ABNORMAL LOW (ref 8.9–10.3)
Chloride: 105 mmol/L (ref 98–111)
Creatinine, Ser: 1.26 mg/dL — ABNORMAL HIGH (ref 0.44–1.00)
GFR, Estimated: 44 mL/min — ABNORMAL LOW (ref >60)
Glucose, Bld: 183 mg/dL — ABNORMAL HIGH (ref 70–99)
Potassium: 4.4 mmol/L (ref 3.5–5.1)
Sodium: 136 mmol/L (ref 135–145)

## 2022-12-03 LAB — CBC
HCT: 40.1 % (ref 36.0–46.0)
Hemoglobin: 13.3 g/dL (ref 12.0–15.0)
MCH: 28.1 pg (ref 26.0–34.0)
MCHC: 33.2 g/dL (ref 30.0–36.0)
MCV: 84.8 fL (ref 80.0–100.0)
Platelets: 210 10*3/uL (ref 150–400)
RBC: 4.73 MIL/uL (ref 3.87–5.11)
RDW: 14 % (ref 11.5–15.5)
WBC: 9.4 10*3/uL (ref 4.0–10.5)
nRBC: 0 % (ref 0.0–0.2)

## 2022-12-03 LAB — GLUCOSE, CAPILLARY
Glucose-Capillary: 213 mg/dL — ABNORMAL HIGH (ref 70–99)
Glucose-Capillary: 186 mg/dL — ABNORMAL HIGH (ref 70–99)
Glucose-Capillary: 137 mg/dL — ABNORMAL HIGH (ref 70–99)
Glucose-Capillary: 136 mg/dL — ABNORMAL HIGH (ref 70–99)

## 2022-12-03 LAB — URIC ACID: Uric Acid, Serum: 10.6 mg/dL — ABNORMAL HIGH (ref 2.5–7.1)

## 2022-12-03 LAB — LIPOPROTEIN A (LPA): Lipoprotein (a): 240.5 nmol/L — ABNORMAL HIGH (ref <75.0)

## 2022-12-03 MED ORDER — PANTOPRAZOLE SODIUM 40 MG PO TBEC
40.0000 mg | DELAYED_RELEASE_TABLET | Freq: Every day | ORAL | 1 refills | Status: DC
  Filled 2022-12-03: qty 30, 30d supply, fill #0

## 2022-12-03 MED ORDER — COLCHICINE 0.6 MG PO TABS
0.6000 mg | ORAL_TABLET | Freq: Two times a day (BID) | ORAL | Status: DC
  Administered 2022-12-03 – 2022-12-04 (×3): 0.6 mg via ORAL
  Filled 2022-12-03 (×3): qty 1

## 2022-12-03 MED ORDER — COLCHICINE 0.6 MG PO TABS
ORAL_TABLET | ORAL | 0 refills | Status: DC
  Filled 2022-12-03: qty 9, 7d supply, fill #0

## 2022-12-03 MED ORDER — SENNA 8.6 MG PO TABS
1.0000 | ORAL_TABLET | Freq: Once | ORAL | Status: AC
  Administered 2022-12-03: 8.6 mg via ORAL
  Filled 2022-12-03: qty 1

## 2022-12-03 MED ORDER — CLOPIDOGREL BISULFATE 75 MG PO TABS
75.0000 mg | ORAL_TABLET | Freq: Every day | ORAL | 2 refills | Status: DC
  Filled 2022-12-03: qty 90, 90d supply, fill #0

## 2022-12-03 MED ORDER — POLYETHYLENE GLYCOL 3350 17 G PO PACK
17.0000 g | PACK | Freq: Once | ORAL | Status: AC
  Administered 2022-12-03: 17 g via ORAL
  Filled 2022-12-03: qty 1

## 2022-12-03 MED ORDER — ASPIRIN 81 MG PO CHEW
81.0000 mg | CHEWABLE_TABLET | Freq: Every day | ORAL | 0 refills | Status: DC
Start: 2022-12-04 — End: 2023-04-04
  Filled 2022-12-03: qty 30, 30d supply, fill #0
  Filled 2022-12-03: qty 90, 90d supply, fill #0

## 2022-12-03 NOTE — Progress Notes (Signed)
CARDIAC REHAB PHASE I   PRE:  Rate/Rhythm: 65 NSR  BP:  Sitting: 155/85      SaO2: 96 RA  MODE:  Ambulation: 470 ft   AD:   RW  POST:  Rate/Rhythm: 86 SR  BP:  Sitting: 171/87      SaO2: 96 RA  Pt ambulated with supervision assist, she c/p of R foot pain during ambulation but tolerated walk well w/o any concerning symptoms.   Faustino Congress  ACSM-CEP 9:30 AM 12/03/2022    Service time is from 0900 to 0931.

## 2022-12-03 NOTE — Discharge Summary (Addendum)
Discharge Summary    Patient ID: Dawn Wood MRN: [id number]; DOB: [birth year]  Admit date: 11/30/2022 Discharge date: 12/04/2022  PCP:  Dorothyann Peng, MD   [facility] Providers Cardiologist:  Orbie Pyo, MD      Discharge Diagnoses    Principal Problem:   STEMI involving left anterior descending coronary artery [facility]) Active Problems:   Essential hypertension   Dyslipidemia   Gout   Type 2 diabetes mellitus with stage 3a chronic kidney disease, with long-term current use of insulin (HCC)   Paroxysmal atrial fibrillation (HCC)   Diagnostic Studies/Procedures    Cath: 12/01/2022  Fluoro time: 35.1 (min) DAP: 14315 (mGycm2) Cumulative Air Kerma: 2496 (mGy)     Ost LAD to Prox LAD lesion is 90% stenosed.   1st Mrg lesion is 60% stenosed.   Dist Cx lesion is 70% stenosed.   Prox RCA to Mid RCA lesion is 99% stenosed.   Previously placed Prox LAD stent of unknown type is  widely patent.   A drug-eluting stent was successfully placed using a SYNERGY XD 3.50X12.   Balloon angioplasty was performed using a BALLN EMERGE MR 2.5X12.   Post intervention, there is a 0% residual stenosis.   Post intervention, there is a 0% residual stenosis.   Post intervention, there is a 99% residual stenosis.   LV end diastolic pressure is mildly elevated.   Recommend to resume Apixaban, at currently prescribed dose and frequency on 12/01/2022.   Recommend concurrent antiplatelet therapy of Aspirin 81 mg for 1 month and Clopidogrel 75mg  daily for 12 months .   2 vessel obstructive CAD. Culprit lesion is proximal LAD at prior stent margin. CTO of the mid RCA Mildly elevated LVEDP Successful PCI of the proximal LAD with DES x 1 Attempted PCI of the mid RCA. Able to cross with wire and POBA was performed with only minimal improvement in flow. Unable to deliver a stent   Plan: DAPT with ASA for one month, Plavix for 12 months. May resume Eliquis tomorrow if no bleeding.  Would treat RCA disease medically. If she has refractory angina could consider dedicated CTO procedure from femoral access.   Diagnostic Dominance: Right  Intervention    Echo: 12/01/2022   1. Left ventricular ejection fraction, by estimation, is 60 to 65%. The  left ventricle has normal function. Left ventricular endocardial border  not optimally defined to evaluate regional wall motion. There is severe  left ventricular hypertrophy of the  basal-septal segment. Left ventricular diastolic parameters are consistent  with Grade I diastolic dysfunction (impaired relaxation).   2. Right ventricular systolic function is normal. The right ventricular  size is normal. There is normal pulmonary artery systolic pressure. The  estimated right ventricular systolic pressure is 4.4 mmHg.   3. The mitral valve is degenerative. Trivial mitral valve regurgitation.  No evidence of mitral stenosis.   4. The aortic valve was not well visualized. Aortic valve regurgitation  is not visualized. No aortic stenosis is present.   5. The inferior vena cava is normal in size with greater than 50%  respiratory variability, suggesting right atrial pressure of 3 mmHg.   6. Recommend repeat limited study using definity contrast to assess for  focal wall motion abnormalites.   FINDINGS   Left Ventricle: Left ventricular ejection fraction, by estimation, is 60  to 65%. The left ventricle has normal function. Left ventricular  endocardial border not optimally defined to evaluate regional wall motion.  The left ventricular  Discharge Summary    Patient ID: Dawn Wood MRN: [id number]; DOB: [birth year]  Admit date: 11/30/2022 Discharge date: 12/04/2022  PCP:  Dorothyann Peng, MD   [facility] Providers Cardiologist:  Orbie Pyo, MD      Discharge Diagnoses    Principal Problem:   STEMI involving left anterior descending coronary artery [facility]) Active Problems:   Essential hypertension   Dyslipidemia   Gout   Type 2 diabetes mellitus with stage 3a chronic kidney disease, with long-term current use of insulin (HCC)   Paroxysmal atrial fibrillation (HCC)   Diagnostic Studies/Procedures    Cath: 12/01/2022  Fluoro time: 35.1 (min) DAP: 14315 (mGycm2) Cumulative Air Kerma: 2496 (mGy)     Ost LAD to Prox LAD lesion is 90% stenosed.   1st Mrg lesion is 60% stenosed.   Dist Cx lesion is 70% stenosed.   Prox RCA to Mid RCA lesion is 99% stenosed.   Previously placed Prox LAD stent of unknown type is  widely patent.   A drug-eluting stent was successfully placed using a SYNERGY XD 3.50X12.   Balloon angioplasty was performed using a BALLN EMERGE MR 2.5X12.   Post intervention, there is a 0% residual stenosis.   Post intervention, there is a 0% residual stenosis.   Post intervention, there is a 99% residual stenosis.   LV end diastolic pressure is mildly elevated.   Recommend to resume Apixaban, at currently prescribed dose and frequency on 12/01/2022.   Recommend concurrent antiplatelet therapy of Aspirin 81 mg for 1 month and Clopidogrel 75mg  daily for 12 months .   2 vessel obstructive CAD. Culprit lesion is proximal LAD at prior stent margin. CTO of the mid RCA Mildly elevated LVEDP Successful PCI of the proximal LAD with DES x 1 Attempted PCI of the mid RCA. Able to cross with wire and POBA was performed with only minimal improvement in flow. Unable to deliver a stent   Plan: DAPT with ASA for one month, Plavix for 12 months. May resume Eliquis tomorrow if no bleeding.  Would treat RCA disease medically. If she has refractory angina could consider dedicated CTO procedure from femoral access.   Diagnostic Dominance: Right  Intervention    Echo: 12/01/2022   1. Left ventricular ejection fraction, by estimation, is 60 to 65%. The  left ventricle has normal function. Left ventricular endocardial border  not optimally defined to evaluate regional wall motion. There is severe  left ventricular hypertrophy of the  basal-septal segment. Left ventricular diastolic parameters are consistent  with Grade I diastolic dysfunction (impaired relaxation).   2. Right ventricular systolic function is normal. The right ventricular  size is normal. There is normal pulmonary artery systolic pressure. The  estimated right ventricular systolic pressure is 4.4 mmHg.   3. The mitral valve is degenerative. Trivial mitral valve regurgitation.  No evidence of mitral stenosis.   4. The aortic valve was not well visualized. Aortic valve regurgitation  is not visualized. No aortic stenosis is present.   5. The inferior vena cava is normal in size with greater than 50%  respiratory variability, suggesting right atrial pressure of 3 mmHg.   6. Recommend repeat limited study using definity contrast to assess for  focal wall motion abnormalites.   FINDINGS   Left Ventricle: Left ventricular ejection fraction, by estimation, is 60  to 65%. The left ventricle has normal function. Left ventricular  endocardial border not optimally defined to evaluate regional wall motion.  The left ventricular  40.1 39.9  MCV 84.8 87.3  PLT 210 198   Basic Metabolic Panel Recent Labs    16/10/96 0422 12/03/22 1222 12/04/22 0424  NA 135 136 134*  K 3.6 4.4 4.0  CL 102 105 104  CO2 23 21* 21*  GLUCOSE 146* 183* 151*  BUN 41* 34* 31*  CREATININE 2.01* 1.26* 1.19*  CALCIUM 8.3* 8.8* 8.3*  MG 2.6*  --   --    Liver Function Tests No results for input(s): "AST", "ALT", "ALKPHOS", "BILITOT", "PROT", "ALBUMIN" in the last 72 hours.  No results for input(s): "LIPASE", "AMYLASE" in the last 72 hours. High Sensitivity Troponin:   Recent Labs  Lab 11/30/22 1635 11/30/22 2200  TROPONINIHS 10 4,094*    BNP Invalid input(s): "POCBNP" D-Dimer No results for input(s): "DDIMER" in the last 72 hours. Hemoglobin A1C No results for input(s): "HGBA1C" in the last 72 hours.  Fasting Lipid Panel No results for input(s): "CHOL", "HDL", "LDLCALC", "TRIG", "CHOLHDL", "LDLDIRECT" in the last 72 hours.  Thyroid Function Tests No results for input(s): "TSH", "T4TOTAL", "T3FREE", "THYROIDAB" in the last 72 hours.  Invalid input(s): "FREET3" _____________  ECHOCARDIOGRAM COMPLETE  Result Date: 12/01/2022    ECHOCARDIOGRAM REPORT   Patient Name:   Dawn Wood Wiswell Date of Exam: 12/01/2022 Medical Rec #:  [id number]              Height:        65.0 in Accession #:    [id number]             Weight:       199.1 lb Date of Birth:  1945-06-09              BSA:          1.974 m Patient Age:    77 years               BP:           109/52 mmHg Patient Gender: F                      HR:           49 bpm. Exam Location:  Inpatient Procedure: 2D Echo, Cardiac Doppler and Color Doppler Indications:    Acute myocardial infarction, unspecified I21.9  History:        Patient has prior history of Echocardiogram examinations, most                 recent 03/19/2022. STEMI, CAD and Previous Myocardial Infarction;                 Risk Factors:Hypertension, Diabetes, Dyslipidemia, Sleep Apnea                 and Former Smoker.  Sonographer:    Aron Baba Referring Phys: [phone] PETER M [place]  Sonographer Comments: Patient is obese and suboptimal parasternal window. IMPRESSIONS  1. Left ventricular ejection fraction, by estimation, is 60 to 65%. The left ventricle has normal function. Left ventricular endocardial border not optimally defined to evaluate regional wall motion. There is severe left ventricular hypertrophy of the basal-septal segment. Left ventricular diastolic parameters are consistent with Grade I diastolic dysfunction (impaired relaxation).  2. Right ventricular systolic function is normal. The right ventricular size is normal. There is normal pulmonary artery systolic pressure. The estimated right ventricular systolic pressure is 4.4 mmHg.  3. The mitral valve is degenerative. Trivial mitral valve regurgitation. No evidence of  40.1 39.9  MCV 84.8 87.3  PLT 210 198   Basic Metabolic Panel Recent Labs    16/10/96 0422 12/03/22 1222 12/04/22 0424  NA 135 136 134*  K 3.6 4.4 4.0  CL 102 105 104  CO2 23 21* 21*  GLUCOSE 146* 183* 151*  BUN 41* 34* 31*  CREATININE 2.01* 1.26* 1.19*  CALCIUM 8.3* 8.8* 8.3*  MG 2.6*  --   --    Liver Function Tests No results for input(s): "AST", "ALT", "ALKPHOS", "BILITOT", "PROT", "ALBUMIN" in the last 72 hours.  No results for input(s): "LIPASE", "AMYLASE" in the last 72 hours. High Sensitivity Troponin:   Recent Labs  Lab 11/30/22 1635 11/30/22 2200  TROPONINIHS 10 4,094*    BNP Invalid input(s): "POCBNP" D-Dimer No results for input(s): "DDIMER" in the last 72 hours. Hemoglobin A1C No results for input(s): "HGBA1C" in the last 72 hours.  Fasting Lipid Panel No results for input(s): "CHOL", "HDL", "LDLCALC", "TRIG", "CHOLHDL", "LDLDIRECT" in the last 72 hours.  Thyroid Function Tests No results for input(s): "TSH", "T4TOTAL", "T3FREE", "THYROIDAB" in the last 72 hours.  Invalid input(s): "FREET3" _____________  ECHOCARDIOGRAM COMPLETE  Result Date: 12/01/2022    ECHOCARDIOGRAM REPORT   Patient Name:   Dawn Wood Wiswell Date of Exam: 12/01/2022 Medical Rec #:  [id number]              Height:        65.0 in Accession #:    [id number]             Weight:       199.1 lb Date of Birth:  1945-06-09              BSA:          1.974 m Patient Age:    77 years               BP:           109/52 mmHg Patient Gender: F                      HR:           49 bpm. Exam Location:  Inpatient Procedure: 2D Echo, Cardiac Doppler and Color Doppler Indications:    Acute myocardial infarction, unspecified I21.9  History:        Patient has prior history of Echocardiogram examinations, most                 recent 03/19/2022. STEMI, CAD and Previous Myocardial Infarction;                 Risk Factors:Hypertension, Diabetes, Dyslipidemia, Sleep Apnea                 and Former Smoker.  Sonographer:    Aron Baba Referring Phys: [phone] PETER M [place]  Sonographer Comments: Patient is obese and suboptimal parasternal window. IMPRESSIONS  1. Left ventricular ejection fraction, by estimation, is 60 to 65%. The left ventricle has normal function. Left ventricular endocardial border not optimally defined to evaluate regional wall motion. There is severe left ventricular hypertrophy of the basal-septal segment. Left ventricular diastolic parameters are consistent with Grade I diastolic dysfunction (impaired relaxation).  2. Right ventricular systolic function is normal. The right ventricular size is normal. There is normal pulmonary artery systolic pressure. The estimated right ventricular systolic pressure is 4.4 mmHg.  3. The mitral valve is degenerative. Trivial mitral valve regurgitation. No evidence of  Discharge Summary    Patient ID: Dawn Wood MRN: [id number]; DOB: [birth year]  Admit date: 11/30/2022 Discharge date: 12/04/2022  PCP:  Dorothyann Peng, MD   [facility] Providers Cardiologist:  Orbie Pyo, MD      Discharge Diagnoses    Principal Problem:   STEMI involving left anterior descending coronary artery [facility]) Active Problems:   Essential hypertension   Dyslipidemia   Gout   Type 2 diabetes mellitus with stage 3a chronic kidney disease, with long-term current use of insulin (HCC)   Paroxysmal atrial fibrillation (HCC)   Diagnostic Studies/Procedures    Cath: 12/01/2022  Fluoro time: 35.1 (min) DAP: 14315 (mGycm2) Cumulative Air Kerma: 2496 (mGy)     Ost LAD to Prox LAD lesion is 90% stenosed.   1st Mrg lesion is 60% stenosed.   Dist Cx lesion is 70% stenosed.   Prox RCA to Mid RCA lesion is 99% stenosed.   Previously placed Prox LAD stent of unknown type is  widely patent.   A drug-eluting stent was successfully placed using a SYNERGY XD 3.50X12.   Balloon angioplasty was performed using a BALLN EMERGE MR 2.5X12.   Post intervention, there is a 0% residual stenosis.   Post intervention, there is a 0% residual stenosis.   Post intervention, there is a 99% residual stenosis.   LV end diastolic pressure is mildly elevated.   Recommend to resume Apixaban, at currently prescribed dose and frequency on 12/01/2022.   Recommend concurrent antiplatelet therapy of Aspirin 81 mg for 1 month and Clopidogrel 75mg  daily for 12 months .   2 vessel obstructive CAD. Culprit lesion is proximal LAD at prior stent margin. CTO of the mid RCA Mildly elevated LVEDP Successful PCI of the proximal LAD with DES x 1 Attempted PCI of the mid RCA. Able to cross with wire and POBA was performed with only minimal improvement in flow. Unable to deliver a stent   Plan: DAPT with ASA for one month, Plavix for 12 months. May resume Eliquis tomorrow if no bleeding.  Would treat RCA disease medically. If she has refractory angina could consider dedicated CTO procedure from femoral access.   Diagnostic Dominance: Right  Intervention    Echo: 12/01/2022   1. Left ventricular ejection fraction, by estimation, is 60 to 65%. The  left ventricle has normal function. Left ventricular endocardial border  not optimally defined to evaluate regional wall motion. There is severe  left ventricular hypertrophy of the  basal-septal segment. Left ventricular diastolic parameters are consistent  with Grade I diastolic dysfunction (impaired relaxation).   2. Right ventricular systolic function is normal. The right ventricular  size is normal. There is normal pulmonary artery systolic pressure. The  estimated right ventricular systolic pressure is 4.4 mmHg.   3. The mitral valve is degenerative. Trivial mitral valve regurgitation.  No evidence of mitral stenosis.   4. The aortic valve was not well visualized. Aortic valve regurgitation  is not visualized. No aortic stenosis is present.   5. The inferior vena cava is normal in size with greater than 50%  respiratory variability, suggesting right atrial pressure of 3 mmHg.   6. Recommend repeat limited study using definity contrast to assess for  focal wall motion abnormalites.   FINDINGS   Left Ventricle: Left ventricular ejection fraction, by estimation, is 60  to 65%. The left ventricle has normal function. Left ventricular  endocardial border not optimally defined to evaluate regional wall motion.  The left ventricular  Discharge Summary    Patient ID: Dawn Wood MRN: [id number]; DOB: [birth year]  Admit date: 11/30/2022 Discharge date: 12/04/2022  PCP:  Dorothyann Peng, MD   [facility] Providers Cardiologist:  Orbie Pyo, MD      Discharge Diagnoses    Principal Problem:   STEMI involving left anterior descending coronary artery [facility]) Active Problems:   Essential hypertension   Dyslipidemia   Gout   Type 2 diabetes mellitus with stage 3a chronic kidney disease, with long-term current use of insulin (HCC)   Paroxysmal atrial fibrillation (HCC)   Diagnostic Studies/Procedures    Cath: 12/01/2022  Fluoro time: 35.1 (min) DAP: 14315 (mGycm2) Cumulative Air Kerma: 2496 (mGy)     Ost LAD to Prox LAD lesion is 90% stenosed.   1st Mrg lesion is 60% stenosed.   Dist Cx lesion is 70% stenosed.   Prox RCA to Mid RCA lesion is 99% stenosed.   Previously placed Prox LAD stent of unknown type is  widely patent.   A drug-eluting stent was successfully placed using a SYNERGY XD 3.50X12.   Balloon angioplasty was performed using a BALLN EMERGE MR 2.5X12.   Post intervention, there is a 0% residual stenosis.   Post intervention, there is a 0% residual stenosis.   Post intervention, there is a 99% residual stenosis.   LV end diastolic pressure is mildly elevated.   Recommend to resume Apixaban, at currently prescribed dose and frequency on 12/01/2022.   Recommend concurrent antiplatelet therapy of Aspirin 81 mg for 1 month and Clopidogrel 75mg  daily for 12 months .   2 vessel obstructive CAD. Culprit lesion is proximal LAD at prior stent margin. CTO of the mid RCA Mildly elevated LVEDP Successful PCI of the proximal LAD with DES x 1 Attempted PCI of the mid RCA. Able to cross with wire and POBA was performed with only minimal improvement in flow. Unable to deliver a stent   Plan: DAPT with ASA for one month, Plavix for 12 months. May resume Eliquis tomorrow if no bleeding.  Would treat RCA disease medically. If she has refractory angina could consider dedicated CTO procedure from femoral access.   Diagnostic Dominance: Right  Intervention    Echo: 12/01/2022   1. Left ventricular ejection fraction, by estimation, is 60 to 65%. The  left ventricle has normal function. Left ventricular endocardial border  not optimally defined to evaluate regional wall motion. There is severe  left ventricular hypertrophy of the  basal-septal segment. Left ventricular diastolic parameters are consistent  with Grade I diastolic dysfunction (impaired relaxation).   2. Right ventricular systolic function is normal. The right ventricular  size is normal. There is normal pulmonary artery systolic pressure. The  estimated right ventricular systolic pressure is 4.4 mmHg.   3. The mitral valve is degenerative. Trivial mitral valve regurgitation.  No evidence of mitral stenosis.   4. The aortic valve was not well visualized. Aortic valve regurgitation  is not visualized. No aortic stenosis is present.   5. The inferior vena cava is normal in size with greater than 50%  respiratory variability, suggesting right atrial pressure of 3 mmHg.   6. Recommend repeat limited study using definity contrast to assess for  focal wall motion abnormalites.   FINDINGS   Left Ventricle: Left ventricular ejection fraction, by estimation, is 60  to 65%. The left ventricle has normal function. Left ventricular  endocardial border not optimally defined to evaluate regional wall motion.  The left ventricular  40.1 39.9  MCV 84.8 87.3  PLT 210 198   Basic Metabolic Panel Recent Labs    16/10/96 0422 12/03/22 1222 12/04/22 0424  NA 135 136 134*  K 3.6 4.4 4.0  CL 102 105 104  CO2 23 21* 21*  GLUCOSE 146* 183* 151*  BUN 41* 34* 31*  CREATININE 2.01* 1.26* 1.19*  CALCIUM 8.3* 8.8* 8.3*  MG 2.6*  --   --    Liver Function Tests No results for input(s): "AST", "ALT", "ALKPHOS", "BILITOT", "PROT", "ALBUMIN" in the last 72 hours.  No results for input(s): "LIPASE", "AMYLASE" in the last 72 hours. High Sensitivity Troponin:   Recent Labs  Lab 11/30/22 1635 11/30/22 2200  TROPONINIHS 10 4,094*    BNP Invalid input(s): "POCBNP" D-Dimer No results for input(s): "DDIMER" in the last 72 hours. Hemoglobin A1C No results for input(s): "HGBA1C" in the last 72 hours.  Fasting Lipid Panel No results for input(s): "CHOL", "HDL", "LDLCALC", "TRIG", "CHOLHDL", "LDLDIRECT" in the last 72 hours.  Thyroid Function Tests No results for input(s): "TSH", "T4TOTAL", "T3FREE", "THYROIDAB" in the last 72 hours.  Invalid input(s): "FREET3" _____________  ECHOCARDIOGRAM COMPLETE  Result Date: 12/01/2022    ECHOCARDIOGRAM REPORT   Patient Name:   Dawn Wood Wiswell Date of Exam: 12/01/2022 Medical Rec #:  [id number]              Height:        65.0 in Accession #:    [id number]             Weight:       199.1 lb Date of Birth:  1945-06-09              BSA:          1.974 m Patient Age:    77 years               BP:           109/52 mmHg Patient Gender: F                      HR:           49 bpm. Exam Location:  Inpatient Procedure: 2D Echo, Cardiac Doppler and Color Doppler Indications:    Acute myocardial infarction, unspecified I21.9  History:        Patient has prior history of Echocardiogram examinations, most                 recent 03/19/2022. STEMI, CAD and Previous Myocardial Infarction;                 Risk Factors:Hypertension, Diabetes, Dyslipidemia, Sleep Apnea                 and Former Smoker.  Sonographer:    Aron Baba Referring Phys: [phone] PETER M [place]  Sonographer Comments: Patient is obese and suboptimal parasternal window. IMPRESSIONS  1. Left ventricular ejection fraction, by estimation, is 60 to 65%. The left ventricle has normal function. Left ventricular endocardial border not optimally defined to evaluate regional wall motion. There is severe left ventricular hypertrophy of the basal-septal segment. Left ventricular diastolic parameters are consistent with Grade I diastolic dysfunction (impaired relaxation).  2. Right ventricular systolic function is normal. The right ventricular size is normal. There is normal pulmonary artery systolic pressure. The estimated right ventricular systolic pressure is 4.4 mmHg.  3. The mitral valve is degenerative. Trivial mitral valve regurgitation. No evidence of  40.1 39.9  MCV 84.8 87.3  PLT 210 198   Basic Metabolic Panel Recent Labs    16/10/96 0422 12/03/22 1222 12/04/22 0424  NA 135 136 134*  K 3.6 4.4 4.0  CL 102 105 104  CO2 23 21* 21*  GLUCOSE 146* 183* 151*  BUN 41* 34* 31*  CREATININE 2.01* 1.26* 1.19*  CALCIUM 8.3* 8.8* 8.3*  MG 2.6*  --   --    Liver Function Tests No results for input(s): "AST", "ALT", "ALKPHOS", "BILITOT", "PROT", "ALBUMIN" in the last 72 hours.  No results for input(s): "LIPASE", "AMYLASE" in the last 72 hours. High Sensitivity Troponin:   Recent Labs  Lab 11/30/22 1635 11/30/22 2200  TROPONINIHS 10 4,094*    BNP Invalid input(s): "POCBNP" D-Dimer No results for input(s): "DDIMER" in the last 72 hours. Hemoglobin A1C No results for input(s): "HGBA1C" in the last 72 hours.  Fasting Lipid Panel No results for input(s): "CHOL", "HDL", "LDLCALC", "TRIG", "CHOLHDL", "LDLDIRECT" in the last 72 hours.  Thyroid Function Tests No results for input(s): "TSH", "T4TOTAL", "T3FREE", "THYROIDAB" in the last 72 hours.  Invalid input(s): "FREET3" _____________  ECHOCARDIOGRAM COMPLETE  Result Date: 12/01/2022    ECHOCARDIOGRAM REPORT   Patient Name:   Dawn Wood Wiswell Date of Exam: 12/01/2022 Medical Rec #:  [id number]              Height:        65.0 in Accession #:    [id number]             Weight:       199.1 lb Date of Birth:  1945-06-09              BSA:          1.974 m Patient Age:    77 years               BP:           109/52 mmHg Patient Gender: F                      HR:           49 bpm. Exam Location:  Inpatient Procedure: 2D Echo, Cardiac Doppler and Color Doppler Indications:    Acute myocardial infarction, unspecified I21.9  History:        Patient has prior history of Echocardiogram examinations, most                 recent 03/19/2022. STEMI, CAD and Previous Myocardial Infarction;                 Risk Factors:Hypertension, Diabetes, Dyslipidemia, Sleep Apnea                 and Former Smoker.  Sonographer:    Aron Baba Referring Phys: [phone] PETER M [place]  Sonographer Comments: Patient is obese and suboptimal parasternal window. IMPRESSIONS  1. Left ventricular ejection fraction, by estimation, is 60 to 65%. The left ventricle has normal function. Left ventricular endocardial border not optimally defined to evaluate regional wall motion. There is severe left ventricular hypertrophy of the basal-septal segment. Left ventricular diastolic parameters are consistent with Grade I diastolic dysfunction (impaired relaxation).  2. Right ventricular systolic function is normal. The right ventricular size is normal. There is normal pulmonary artery systolic pressure. The estimated right ventricular systolic pressure is 4.4 mmHg.  3. The mitral valve is degenerative. Trivial mitral valve regurgitation. No evidence of  Discharge Summary    Patient ID: Dawn Wood MRN: [id number]; DOB: [birth year]  Admit date: 11/30/2022 Discharge date: 12/04/2022  PCP:  Dorothyann Peng, MD   [facility] Providers Cardiologist:  Orbie Pyo, MD      Discharge Diagnoses    Principal Problem:   STEMI involving left anterior descending coronary artery [facility]) Active Problems:   Essential hypertension   Dyslipidemia   Gout   Type 2 diabetes mellitus with stage 3a chronic kidney disease, with long-term current use of insulin (HCC)   Paroxysmal atrial fibrillation (HCC)   Diagnostic Studies/Procedures    Cath: 12/01/2022  Fluoro time: 35.1 (min) DAP: 14315 (mGycm2) Cumulative Air Kerma: 2496 (mGy)     Ost LAD to Prox LAD lesion is 90% stenosed.   1st Mrg lesion is 60% stenosed.   Dist Cx lesion is 70% stenosed.   Prox RCA to Mid RCA lesion is 99% stenosed.   Previously placed Prox LAD stent of unknown type is  widely patent.   A drug-eluting stent was successfully placed using a SYNERGY XD 3.50X12.   Balloon angioplasty was performed using a BALLN EMERGE MR 2.5X12.   Post intervention, there is a 0% residual stenosis.   Post intervention, there is a 0% residual stenosis.   Post intervention, there is a 99% residual stenosis.   LV end diastolic pressure is mildly elevated.   Recommend to resume Apixaban, at currently prescribed dose and frequency on 12/01/2022.   Recommend concurrent antiplatelet therapy of Aspirin 81 mg for 1 month and Clopidogrel 75mg  daily for 12 months .   2 vessel obstructive CAD. Culprit lesion is proximal LAD at prior stent margin. CTO of the mid RCA Mildly elevated LVEDP Successful PCI of the proximal LAD with DES x 1 Attempted PCI of the mid RCA. Able to cross with wire and POBA was performed with only minimal improvement in flow. Unable to deliver a stent   Plan: DAPT with ASA for one month, Plavix for 12 months. May resume Eliquis tomorrow if no bleeding.  Would treat RCA disease medically. If she has refractory angina could consider dedicated CTO procedure from femoral access.   Diagnostic Dominance: Right  Intervention    Echo: 12/01/2022   1. Left ventricular ejection fraction, by estimation, is 60 to 65%. The  left ventricle has normal function. Left ventricular endocardial border  not optimally defined to evaluate regional wall motion. There is severe  left ventricular hypertrophy of the  basal-septal segment. Left ventricular diastolic parameters are consistent  with Grade I diastolic dysfunction (impaired relaxation).   2. Right ventricular systolic function is normal. The right ventricular  size is normal. There is normal pulmonary artery systolic pressure. The  estimated right ventricular systolic pressure is 4.4 mmHg.   3. The mitral valve is degenerative. Trivial mitral valve regurgitation.  No evidence of mitral stenosis.   4. The aortic valve was not well visualized. Aortic valve regurgitation  is not visualized. No aortic stenosis is present.   5. The inferior vena cava is normal in size with greater than 50%  respiratory variability, suggesting right atrial pressure of 3 mmHg.   6. Recommend repeat limited study using definity contrast to assess for  focal wall motion abnormalites.   FINDINGS   Left Ventricle: Left ventricular ejection fraction, by estimation, is 60  to 65%. The left ventricle has normal function. Left ventricular  endocardial border not optimally defined to evaluate regional wall motion.  The left ventricular

## 2022-12-03 NOTE — Progress Notes (Addendum)
Patient Name: Dawn Wood Date of Encounter: 12/03/2022 [facility] Cardiologist: None   Interval Summary  .    No further chest pain. C/o left foot pain, tender to touch around the ankle, swelling noted. Denies any injury.   Vital Signs .    Vitals:   12/02/22 0837 12/02/22 1939 12/03/22 0319 12/03/22 0825  BP: (!) 140/68 125/69 (!) 143/63 (!) 155/85  Pulse: 68 62 (!) 59 60  Resp:  18 16 14   Temp:  98.4 F (36.9 C) 98.7 F (37.1 C) 98.6 F (37 C)  TempSrc:  Oral Oral Oral  SpO2: 100%  98% 95%  Weight:        Intake/Output Summary (Last 24 hours) at 12/03/2022 0844 Last data filed at 12/02/2022 1942 Gross per 24 hour  Intake 395 ml  Output 300 ml  Net 95 ml      12/01/2022    2:56 AM 11/25/2022    2:16 PM 10/25/2022   11:20 AM  Last 3 Weights  Weight (lbs) 199 lb 1.2 oz 211 lb 213 lb  Weight (kg) 90.3 kg 95.709 kg 96.616 kg      Telemetry/ECG    Sinus Rhythm - Personally Reviewed  Physical Exam .   GEN: No acute distress.   Neck: No JVD Cardiac: RRR, no murmurs, rubs, or gallops.  Respiratory: Clear to auscultation bilaterally. GI: Soft, nontender, non-distended  MS: No edema, Right foot tender with swelling   Assessment & Plan .     77 y.o. female with CAD status post NSTEMI with PCI/DES to LAD '15, hypertension, hyperlipidemia, peripheral vascular disease, diabetes, paroxysmal atrial fibrillation, arthritis, OSA who is being seen 11/30/2022 for the evaluation of STEMI/chest pain.    STEMI -- Underwent cardiac catheterization 9/24 with successful PCI of proximal LAD with DES x 1.  Attempted PCI of mid RCA, is able to cross with wire but balloon angioplasty was performed with minimal flow.  Unable to deliver a stent.  Recommendations for triple therapy with aspirin, Plavix and Eliquis for 1 month.  After 1 month stop aspirin.  If refractory angina would consider CTO of the RCA from femoral approach.  Echocardiogram with LVEF of 60 to 65%,  severe LVH, grade 1 diastolic dysfunction, normal RV, trivial MR. -- No recurrent chest pain, cardiac rehab following -- Continue aspirin, Plavix, metoprolol succinate 150 mg daily, Imdur 60 mg daily   NSVT -- Resolved, mag 2.6, potassium 3.6 -- Continue metoprolol XL 150 mg daily   HLD -- LDL 109, HDL 36 -- History of statin intolerance, plan for lipid clinic referral for PCSK9 as an outpatient   DM -- Hemoglobin A1c 7.1 -- On NovoLog 70/30 mix, 40 units twice daily, along with SSI   Paroxysmal atrial fibrillation -- remains in sinus rhythm -- Continue metoprolol XL, Eliquis 5 mg twice daily   AKI -- Creatinine up from 1.2-2 yesterday. Morning labs pending -- Held irbesartan, spironolactone  Right foot pain/swelling Hx of gout -- reports episode started yesterday afternoon, feels somewhat similar to her gout pain in the past -- check uric acid with am labs -- will likely start colchicine pending renal function on morning labs  For questions or updates, please contact [facility] Please consult [email or web address] for contact info under        Signed, Laverda Page, NP    ATTENDING ATTESTATION:  After conducting a review of all available clinical information with the care team, interviewing the patient, and performing a  physical exam, I agree with the findings and plan described in this note.   GEN: No acute distress.   HEENT:  MMM, no JVD, no scleral icterus Cardiac: RRR, no murmurs, rubs, or gallops.  Respiratory: Clear to auscultation bilaterally. GI: Soft, nontender, non-distended  MS: No edema; No deformity.  Left foot somewhat swollen and red; not exquisitely painful to palpation Neuro:  Nonfocal  Vasc:  +2 radial pulses  Patient doing well from a cardiovascular perspective without recurrent chest pain or severe shortness of breath.  She developed an acute gout flare of her left foot.  Uric acid level was elevated.  Her creatinine fortunately is much  improved.  Will start colchicine.  Encourage ambulation.  Discharge in a.m.  Alverda Skeans, MD Pager [phone]

## 2022-12-03 NOTE — Plan of Care (Signed)

## 2022-12-03 NOTE — Plan of Care (Signed)

## 2022-12-03 NOTE — Care Management Important Message (Signed)
Important Message  Patient Details  Name: Dharma Pare MRN: [id number] Date of Birth: 1945/12/01   Important Message Given:  Yes - Medicare IM     Sherilyn Banker 12/03/2022, 10:35 AM

## 2022-12-04 ENCOUNTER — Other Ambulatory Visit (HOSPITAL_COMMUNITY): Payer: Self-pay

## 2022-12-04 DIAGNOSIS — E7849 Other hyperlipidemia: Secondary | ICD-10-CM | POA: Diagnosis not present

## 2022-12-04 DIAGNOSIS — Z8639 Personal history of other endocrine, nutritional and metabolic disease: Secondary | ICD-10-CM

## 2022-12-04 DIAGNOSIS — M10072 Idiopathic gout, left ankle and foot: Secondary | ICD-10-CM

## 2022-12-04 DIAGNOSIS — I480 Paroxysmal atrial fibrillation: Secondary | ICD-10-CM | POA: Diagnosis not present

## 2022-12-04 DIAGNOSIS — I2102 ST elevation (STEMI) myocardial infarction involving left anterior descending coronary artery: Secondary | ICD-10-CM | POA: Diagnosis not present

## 2022-12-04 LAB — BASIC METABOLIC PANEL
Anion gap: 9 (ref 5–15)
BUN: 31 mg/dL — ABNORMAL HIGH (ref 8–23)
CO2: 21 mmol/L — ABNORMAL LOW (ref 22–32)
Calcium: 8.3 mg/dL — ABNORMAL LOW (ref 8.9–10.3)
Chloride: 104 mmol/L (ref 98–111)
Creatinine, Ser: 1.19 mg/dL — ABNORMAL HIGH (ref 0.44–1.00)
GFR, Estimated: 47 mL/min — ABNORMAL LOW (ref >60)
Glucose, Bld: 151 mg/dL — ABNORMAL HIGH (ref 70–99)
Potassium: 4 mmol/L (ref 3.5–5.1)
Sodium: 134 mmol/L — ABNORMAL LOW (ref 135–145)

## 2022-12-04 LAB — CBC
HCT: 39.9 % (ref 36.0–46.0)
Hemoglobin: 13.1 g/dL (ref 12.0–15.0)
MCH: 28.7 pg (ref 26.0–34.0)
MCHC: 32.8 g/dL (ref 30.0–36.0)
MCV: 87.3 fL (ref 80.0–100.0)
Platelets: 198 10*3/uL (ref 150–400)
RBC: 4.57 MIL/uL (ref 3.87–5.11)
RDW: 14 % (ref 11.5–15.5)
WBC: 9.3 10*3/uL (ref 4.0–10.5)
nRBC: 0 % (ref 0.0–0.2)

## 2022-12-04 LAB — GLUCOSE, CAPILLARY: Glucose-Capillary: 169 mg/dL — ABNORMAL HIGH (ref 70–99)

## 2022-12-04 MED ORDER — SPIRONOLACTONE 25 MG PO TABS: 25.0000 mg | ORAL_TABLET | Freq: Every day | ORAL | Status: DC

## 2022-12-04 NOTE — Progress Notes (Signed)
Progress Note  Patient Name: Dawn Wood Date of Encounter: 12/04/2022  Primary Cardiologist: Orbie Pyo, MD  Subjective   No acute events overnight, no chest pain in the last 24 hours.  AKI resolved.  Inpatient Medications    Scheduled Meds:  apixaban  5 mg Oral BID   aspirin  81 mg Oral Daily   clopidogrel  75 mg Oral Q breakfast   colchicine  0.6 mg Oral BID   dorzolamide-timolol  1 drop Right Eye BID   insulin aspart  0-15 Units Subcutaneous TID WC   insulin aspart protamine - aspart  40 Units Subcutaneous BID WC   isosorbide mononitrate  60 mg Oral Daily   latanoprost  1 drop Both Eyes QHS   metoprolol succinate  150 mg Oral Daily   sodium chloride flush  3 mL Intravenous Q12H   Continuous Infusions:  sodium chloride     PRN Meds: sodium chloride, acetaminophen, acetaminophen, fentaNYL (SUBLIMAZE) injection, nitroGLYCERIN, ondansetron (ZOFRAN) IV, mouth rinse, sodium chloride flush   Vital Signs    Vitals:   12/03/22 1638 12/03/22 1933 12/04/22 0428 12/04/22 0743  BP: (!) 140/69 (!) 154/85 (!) 165/77   Pulse: 60 70 (!) 56 60  Resp:  17 20 16   Temp: 98.6 F (37 C) 99.1 F (37.3 C) 98.5 F (36.9 C)   TempSrc: Oral Oral Oral   SpO2: 97% 93% 100% 98%  Weight:        Intake/Output Summary (Last 24 hours) at 12/04/2022 0947 Last data filed at 12/04/2022 0432 Gross per 24 hour  Intake --  Output 1000 ml  Net -1000 ml   Filed Weights   12/01/22 0256  Weight: 90.3 kg    Telemetry     Personally reviewed, NSR  ECG    Not performed today  Physical Exam   GEN: No acute distress.   Neck: No JVD. Cardiac: RRR, no murmur, rub, or gallop.  Respiratory: Nonlabored. Clear to auscultation bilaterally. GI: Soft, nontender, bowel sounds present. MS: No edema; No deformity. Neuro:  Nonfocal. Psych: Alert and oriented x 3. Normal affect.  Labs    Chemistry Recent Labs  Lab 11/30/22 1635 12/01/22 0659 12/02/22 0422 12/03/22 1222  12/04/22 0424  NA 138   < > 135 136 134*  K 3.4*   < > 3.6 4.4 4.0  CL 104   < > 102 105 104  CO2 22   < > 23 21* 21*  GLUCOSE 194*   < > 146* 183* 151*  BUN 26*   < > 41* 34* 31*  CREATININE 1.19*   < > 2.01* 1.26* 1.19*  CALCIUM 9.1   < > 8.3* 8.8* 8.3*  PROT 7.4  --   --   --   --   ALBUMIN 3.6  --   --   --   --   AST 16  --   --   --   --   ALT 15  --   --   --   --   ALKPHOS 64  --   --   --   --   BILITOT 1.2  --   --   --   --   GFRNONAA 47*   < > 25* 44* 47*  ANIONGAP 12   < > 10 10 9    < > = values in this interval not displayed.     Hematology Recent Labs  Lab 12/02/22 0422 12/03/22 1222 12/04/22 0424  WBC 8.7 9.4 9.3  RBC 4.81 4.73 4.57  HGB 13.1 13.3 13.1  HCT 41.7 40.1 39.9  MCV 86.7 84.8 87.3  MCH 27.2 28.1 28.7  MCHC 31.4 33.2 32.8  RDW 14.3 14.0 14.0  PLT 212 210 198    Cardiac Enzymes Recent Labs  Lab 11/30/22 1635 11/30/22 2200  TROPONINIHS 10 4,094*    BNPNo results for input(s): "BNP", "PROBNP" in the last 168 hours.   DDimerNo results for input(s): "DDIMER" in the last 168 hours.   Radiology    No results found.   Assessment & Plan     Acute anterior STEMI s/p pLAD PCI and angioplasty of mid RCA stenosis Acute gouty arthritis CAD manifested by NSTEMI 2015 s/p LAD PCI HLD on Repatha, not at goal Paroxysmal atrial fibrillation Insulin-dependent diabetes mellitus OSA   -Continue triple therapy for 1 month (aspirin 81, Plavix 75, Eliquis 5 BID).  After 1 month, stop aspirin and continue Plavix 75 and Eliquis 5 BID for total duration of 1 year. Treat CTO RCA s/p angioplasty medically, if respiratory angina in the future, will perform CTO RCA PCI from femoral access.  Arterial access site intact.  No chest pain the last 24 hours, ambulated with no issues.  Okay to go home today.  Continue Repatha every 2 weeks. -Serum creatinine improved significantly with IV fluids.  Serum creatinine today is 1.1, her baseline. -Continue colchicine  0.6 mg twice daily for acute gouty arthritis.  Serum uric acid was noted to be elevated. -Continue Imdur 60 mg once daily, metoprolol succinate 150 mg once daily.  Will restart olmesartan 40 mg once daily and spironolactone 25 mg once daily as AKI has resolved. -Continue home insulin, management per PCP/endocrine.   Disposition: Discharge to home today   Signed, Marjo Bicker, MD  12/04/2022, 9:47 AM

## 2022-12-04 NOTE — Progress Notes (Signed)
CARDIAC REHAB PHASE I   PRE:  Rate/Rhythm: 58 NSR  BP:  Sitting: 155/84      SaO2: 96 RA  MODE:  Ambulation: 470 ft   AD:   RW  POST:  Rate/Rhythm: 68  BP:  Sitting: 1747/96      SaO2: 97 RA  Pt ambulated in the hallway with supervision assist, walked faster today than yesterday.   Dawn Wood  ACSM-CEP 9:52 AM 12/04/2022    Service time is from 0902 to [phone].

## 2022-12-06 ENCOUNTER — Other Ambulatory Visit (HOSPITAL_COMMUNITY): Payer: Self-pay

## 2022-12-07 ENCOUNTER — Telehealth: Payer: Self-pay

## 2022-12-07 ENCOUNTER — Other Ambulatory Visit (HOSPITAL_COMMUNITY): Payer: Self-pay

## 2022-12-07 DIAGNOSIS — I219 Acute myocardial infarction, unspecified: Secondary | ICD-10-CM

## 2022-12-07 HISTORY — DX: Acute myocardial infarction, unspecified: I21.9

## 2022-12-07 NOTE — Transitions of Care (Post Inpatient/ED Visit) (Signed)
12/07/2022  Name: Dawn Wood MRN: [id number] DOB: [birth year]  Today's TOC FU Call Status: Today's TOC FU Call Status:: Successful TOC FU Call Completed TOC FU Call Complete Date: 12/07/22 Patient's Name and Date of Birth confirmed.  Transition Care Management Follow-up Telephone Call Date of Discharge: 12/04/22 Discharge Facility: [facility]) Type of Discharge: Inpatient Admission Primary Inpatient Discharge Diagnosis:: STEMI How have you been since you were released from the hospital?: Better Any questions or concerns?: No  Items Reviewed: Did you receive and understand the discharge instructions provided?: Yes Medications obtained,verified, and reconciled?: Yes (Medications Reviewed) Any new allergies since your discharge?: No Dietary orders reviewed?: NA Do you have support at home?: Yes People in Home: other relative(s)  Medications Reviewed Today: Medications Reviewed Today     Reviewed by Barb Merino, LPN (Licensed Practical Nurse) on 12/07/22 at 1100  Med List Status: <None>   Medication Order Taking? Sig Documenting Provider Last Dose Status Informant  Accu-Chek Softclix Lancets lancets [id number] No USE TO CHECK BLOOD SUGARS ONCE DAILY. Dorothyann Peng, MD Taking Active Self, Pharmacy Records  apixaban [facility]) 5 MG TABS tablet [id number] No Take 1 tablet (5 mg total) by mouth 2 (two) times daily. Sharlene Dory, [place] 11/30/2022 0800 Active Self, Pharmacy Records  aspirin 81 MG chewable tablet [id number]  Chew 1 tablet (81 mg total) by mouth daily. Arty Baumgartner, NP  Active   BD PEN NEEDLE NANO 2ND GEN 32G X 4 MM MISC [id number] No USE AS DIRECTED TWICE DAILY Dorothyann Peng, MD Taking Active Self, Pharmacy Records  Blood Glucose Monitoring Suppl (ACCU-CHEK GUIDE) w/Device KIT [id number] No USE TO CHECK BLOOD SUGARS ONCE DAILY. Dorothyann Peng, MD Taking Active Self, Pharmacy Records  Cholecalciferol (VITAMIN D3) 2000 units TABS [id number] No Take 1  tablet by mouth daily. [provider] 11/30/2022 Active Self, Pharmacy Records  clopidogrel (PLAVIX) 75 MG tablet [id number]  Take 1 tablet (75 mg total) by mouth daily with breakfast. Laverda Page B, NP  Active   colchicine 0.6 MG tablet [id number]  Take 1 tablet (0.6 mg total) by mouth 2 (two) times daily for 2 days, THEN 1 tablet (0.6 mg total) daily for 5 days. Laverda Page B, NP  Active   dorzolamide-timolol (COSOPT) 2-0.5 % ophthalmic solution [id number] No Place 1 drop into the right eye 2 (two) times daily. [provider] 11/30/2022 Active Self, Pharmacy Records  Evolocumab [facility] SURECLICK) 140 MG/ML SOAJ [id number] No Inject 140 mg into the skin every 14 (fourteen) days. Meriam Sprague, MD 11/20/2022 Active Self, Pharmacy Records  fluticasone [facility]) 50 MCG/ACT nasal spray [id number] No SHAKE LIQUID AND USE 1 SPRAY IN EACH NOSTRIL DAILY AS NEEDED FOR ALLERGIES OR RHINITIS Dorothyann Peng, MD Unknown Active Self, Pharmacy Records  furosemide (LASIX) 20 MG tablet [id number] No Take 20 mg by mouth as needed for edema or fluid. Pt takes 1 tablet 20 mg as PRN. [provider] Unknown Active Self, Pharmacy Records  glucose blood (ACCU-CHEK GUIDE) test strip [id number] No USE TO CHECK BLOOD SUGARS ONCE DAILY. Dorothyann Peng, MD Taking Active Self, Pharmacy Records  insulin aspart protamine - aspart (NOVOLOG MIX 70/30 FLEXPEN) (70-30) 100 UNIT/ML FlexPen [id number] No INJECT 40 UNITS SUBCUTANEOUS EVERY MORNING AND 50 UNITS SUBCUTANEOUS AT Helen Hashimoto, Melina Schools, MD 11/30/2022 Active Self, Pharmacy Records  isosorbide mononitrate (IMDUR) 60 MG 24 hr tablet [id number] No Take 1 tablet (60 mg total) by mouth daily. Pt takes 1 tablet by mouth daily. Elizabeth Palau  Montez Hageman., NP 11/30/2022 Active Self, Pharmacy Records  ketorolac (ACULAR) 0.5 % ophthalmic solution [id number] No Place 1 drop into the right eye 3 (three) times daily. [provider] 11/30/2022 Active Self,  Pharmacy Records  metoprolol succinate (TOPROL-XL) 100 MG 24 hr tablet [id number] No Take 1.5 tablets (150 mg total) by mouth daily. Take with or immediately following a meal.  Patient taking differently: Take 100 mg by mouth daily. Take with or immediately following a meal.   Gaston Islam., NP 11/30/2022 Active Self, Pharmacy Records  nitroGLYCERIN (NITROSTAT) 0.4 MG SL tablet [id number] No Place 1 tablet (0.4 mg total) under the tongue every 5 (five) minutes as needed for chest pain. Meriam Sprague, MD 11/30/2022 Active Self, Pharmacy Records  olmesartan [facility]) 40 MG tablet [id number] No TAKE 1 TABLET(40 MG) BY MOUTH DAILY Dorothyann Peng, MD 11/30/2022 Active Self, Pharmacy Records  [facility] VERIO test strip [id number] No USE TO CHECK BLOOD SUGARS TWICE DAILY AS DIRECTED Dorothyann Peng, MD Taking Active Self, Pharmacy Records  pantoprazole (PROTONIX) 40 MG tablet [id number]  Take 1 tablet (40 mg total) by mouth daily. Arty Baumgartner, NP  Active   Semaglutide,0.25 or 0.5MG /DOS, (OZEMPIC, 0.25 OR 0.5 MG/DOSE,) 2 MG/1.5ML SOPN [id number] No INJECT 0.5MG  INTO SKIN ONCE A WEEK  Patient not taking: Reported on 12/01/2022   Dorothyann Peng, MD Not Taking Active Self, Pharmacy Records  sodium chloride (MURO 128) 5 % ophthalmic solution [id number] No Place 1 drop into the right eye 2 (two) times daily. [provider] 11/30/2022 Active Self, Pharmacy Records  spironolactone (ALDACTONE) 25 MG tablet [id number] No Take 1 tablet (25 mg total) by mouth daily. Gaston Islam., NP 11/30/2022 Active Self, Pharmacy Records  traMADol (ULTRAM) 50 MG tablet [id number] No Take 50 mg by mouth daily at 6 (six) AM.  Patient not taking: Reported on 12/01/2022   [provider] Not Taking Active Self, Pharmacy Records  VYZULTA 0.024 % SOLN [id number] No Apply 1 drop to eye in the morning, at noon, and at bedtime. [provider] 11/30/2022 Active Self, Pharmacy Records            Home  Care and Equipment/Supplies: Were Home Health Services Ordered?: NA Any new equipment or medical supplies ordered?: NA  Functional Questionnaire: Do you need assistance with bathing/showering or dressing?: No Do you need assistance with meal preparation?: No Do you need assistance with eating?: No Do you have difficulty maintaining continence: No Do you need assistance with getting out of bed/getting out of a chair/moving?: No Do you have difficulty managing or taking your medications?: No  Follow up appointments reviewed: PCP Follow-up appointment confirmed?: NA Specialist Hospital Follow-up appointment confirmed?: Yes Date of Specialist follow-up appointment?: 12/14/22 Follow-Up Specialty Provider:: Cardiology Do you need transportation to your follow-up appointment?: No Do you understand care options if your condition(s) worsen?: Yes-patient verbalized understanding    SIGNATURE Elisha Ponder LPN [facility] AWV Team Direct dial:  [phone]

## 2022-12-08 ENCOUNTER — Telehealth (HOSPITAL_COMMUNITY): Payer: Self-pay

## 2022-12-08 NOTE — Progress Notes (Deleted)
I,Ceci Taliaferro T Deloria Lair, CMA,acting as a Neurosurgeon for [name], MD.,have documented all relevant documentation on the behalf of [name], MD,as directed by  [name], MD while in the presence of [name], MD.  Subjective:  Patient ID: Dawn Wood , female    DOB: [birth year] , 77 y.o.   MRN: [id number]  No chief complaint on file.   HPI  Patient presents today for hospital follow up. Admitted on 11/30/22 for STEMI involving left anterior descending coronary artery [facility]) & discharged on 12/04/2022.  Today she reports       Past Medical History:  Diagnosis Date   Coronary artery disease    Diabetes mellitus without complication (HCC)    GERD (gastroesophageal reflux disease)    Hypertension    MI (myocardial infarction) (HCC)    Peripheral vascular disease (HCC)      Family History  Problem Relation Age of Onset   Hypertension Mother    Cancer Father    Hypertension Father    Breast cancer Sister 42   Aortic stenosis Daughter    Heart disease Daughter        before age 10   Thyroid disease Daughter    Sleep apnea Neg Hx      Current Outpatient Medications:    Accu-Chek Softclix Lancets lancets, USE TO CHECK BLOOD SUGARS ONCE DAILY., Disp: 100 each, Rfl: 12   apixaban (ELIQUIS) 5 MG TABS tablet, Take 1 tablet (5 mg total) by mouth 2 (two) times daily., Disp: 180 tablet, Rfl: 1   aspirin 81 MG chewable tablet, Chew 1 tablet (81 mg total) by mouth daily., Disp: 30 tablet, Rfl: 0   BD PEN NEEDLE NANO 2ND GEN 32G X 4 MM MISC, USE AS DIRECTED TWICE DAILY, Disp: 100 each, Rfl: 2   Blood Glucose Monitoring Suppl (ACCU-CHEK GUIDE) w/Device KIT, USE TO CHECK BLOOD SUGARS ONCE DAILY., Disp: 1 kit, Rfl: 0   Cholecalciferol (VITAMIN D3) 2000 units TABS, Take 1 tablet by mouth daily., Disp: , Rfl:    clopidogrel (PLAVIX) 75 MG tablet, Take 1 tablet (75 mg total) by mouth daily with breakfast., Disp: 90 tablet, Rfl: 2   colchicine 0.6 MG tablet, Take 1  tablet (0.6 mg total) by mouth 2 (two) times daily for 2 days, THEN 1 tablet (0.6 mg total) daily for 5 days., Disp: 9 tablet, Rfl: 0   dorzolamide-timolol (COSOPT) 2-0.5 % ophthalmic solution, Place 1 drop into the right eye 2 (two) times daily., Disp: , Rfl:    Evolocumab (REPATHA SURECLICK) 140 MG/ML SOAJ, Inject 140 mg into the skin every 14 (fourteen) days., Disp: 6 mL, Rfl: 1   fluticasone (FLONASE) 50 MCG/ACT nasal spray, SHAKE LIQUID AND USE 1 SPRAY IN EACH NOSTRIL DAILY AS NEEDED FOR ALLERGIES OR RHINITIS, Disp: 16 g, Rfl: 2   furosemide (LASIX) 20 MG tablet, Take 20 mg by mouth as needed for edema or fluid. Pt takes 1 tablet 20 mg as PRN., Disp: , Rfl:    glucose blood (ACCU-CHEK GUIDE) test strip, USE TO CHECK BLOOD SUGARS ONCE DAILY., Disp: 100 each, Rfl: 12   insulin aspart protamine - aspart (NOVOLOG MIX 70/30 FLEXPEN) (70-30) 100 UNIT/ML FlexPen, INJECT 40 UNITS SUBCUTANEOUS EVERY MORNING AND 50 UNITS SUBCUTANEOUS AT DINNER, Disp: 75 mL, Rfl: 2   isosorbide mononitrate (IMDUR) 60 MG 24 hr tablet, Take 1 tablet (60 mg total) by mouth daily. Pt takes 1 tablet by mouth daily., Disp: 30 tablet, Rfl: 7  ketorolac (ACULAR) 0.5 % ophthalmic solution, Place 1 drop into the right eye 3 (three) times daily., Disp: , Rfl:    metoprolol succinate (TOPROL-XL) 100 MG 24 hr tablet, Take 1.5 tablets (150 mg total) by mouth daily. Take with or immediately following a meal. (Patient taking differently: Take 100 mg by mouth daily. Take with or immediately following a meal.), Disp: 135 tablet, Rfl: 3   nitroGLYCERIN (NITROSTAT) 0.4 MG SL tablet, Place 1 tablet (0.4 mg total) under the tongue every 5 (five) minutes as needed for chest pain., Disp: 25 tablet, Rfl: 11   olmesartan (BENICAR) 40 MG tablet, TAKE 1 TABLET(40 MG) BY MOUTH DAILY, Disp: 90 tablet, Rfl: 2   ONETOUCH VERIO test strip, USE TO CHECK BLOOD SUGARS TWICE DAILY AS DIRECTED, Disp: 100 strip, Rfl: 3   pantoprazole (PROTONIX) 40 MG tablet, Take  1 tablet (40 mg total) by mouth daily., Disp: 30 tablet, Rfl: 1   Semaglutide,0.25 or 0.5MG /DOS, (OZEMPIC, 0.25 OR 0.5 MG/DOSE,) 2 MG/1.5ML SOPN, INJECT 0.5MG  INTO SKIN ONCE A WEEK (Patient not taking: Reported on 12/01/2022), Disp: 4.5 mL, Rfl: 3   sodium chloride (MURO 128) 5 % ophthalmic solution, Place 1 drop into the right eye 2 (two) times daily., Disp: , Rfl:    spironolactone (ALDACTONE) 25 MG tablet, Take 1 tablet (25 mg total) by mouth daily., Disp: 90 tablet, Rfl: 2   traMADol (ULTRAM) 50 MG tablet, Take 50 mg by mouth daily at 6 (six) AM. (Patient not taking: Reported on 12/01/2022), Disp: , Rfl:    VYZULTA 0.024 % SOLN, Apply 1 drop to eye in the morning, at noon, and at bedtime., Disp: , Rfl:    Allergies  Allergen Reactions   Dust Mite Extract Other (See Comments)    REACTION: SNEEZING   Other Other (See Comments)    ALLERGEN: DEODORIZERS SPRAY AND PERFUMES REACTION: SNEEZING   Statins Other (See Comments)    Muscle aches with rosuvastatin 5 mg daily and 3x/weekly, atorvastatin 20 mg daily and 3x/week, simvastatin 40 mg daily    Allopurinol     Diarrhea and the pt said it made her sugars low   Pollen Extract Other (See Comments)    Reaction unknown     Review of Systems  Constitutional: Negative.   Respiratory: Negative.    Cardiovascular: Negative.   Neurological: Negative.   Psychiatric/Behavioral: Negative.       There were no vitals filed for this visit. There is no height or weight on file to calculate BMI.  Wt Readings from Last 3 Encounters:  12/01/22 199 lb 1.2 oz (90.3 kg)  11/25/22 211 lb (95.7 kg)  10/25/22 213 lb (96.6 kg)     Objective:  Physical Exam      Assessment And Plan:  STEMI involving left anterior descending coronary artery (HCC)  Paroxysmal atrial fibrillation (HCC)     No follow-ups on file.  Patient was given opportunity to ask questions. Patient verbalized understanding of the plan and was able to repeat key elements of the  plan. All questions were answered to their satisfaction.  [name], MD  I, [name], MD, have reviewed all documentation for this visit. The documentation on 12/08/22 for the exam, diagnosis, procedures, and orders are all accurate and complete.   IF YOU HAVE BEEN REFERRED TO A SPECIALIST, IT MAY TAKE 1-2 WEEKS TO SCHEDULE/PROCESS THE REFERRAL. IF YOU HAVE NOT HEARD FROM US/SPECIALIST IN TWO WEEKS, PLEASE GIVE [place] A CALL AT [phone] X  [phone].   THE PATIENT IS ENCOURAGED TO PRACTICE SOCIAL DISTANCING DUE TO THE COVID-19 PANDEMIC.

## 2022-12-08 NOTE — Telephone Encounter (Signed)
Called patient to see if she is interested in the Cardiac Rehab Program. Patient expressed interest. Explained scheduling process and went over insurance, patient verbalized understanding. Will contact patient for scheduling once f/u has been completed. 

## 2022-12-08 NOTE — Telephone Encounter (Signed)
Pt insurance is active and benefits verified through [facility] Co-pay $20, DED 0/0 met, out of pocket $4,000/$1,319.43 met, co-insurance 0%. no pre-authorization required. Passport, 12/08/2022@3 :40, REF# [id number]   How many CR sessions are covered? (for ICR) 72 vists Is this a lifetime maximum or an annual maximum? annual Has the member used any of these services to date? no Is there a time limit (weeks/months) on start of program and/or program completion? no   Will contact patient to see if she is interested in the Cardiac Rehab Program. If interested, patient will need to complete follow up appt. Once completed, patient will be contacted for scheduling upon review by the RN Navigator.

## 2022-12-08 NOTE — Telephone Encounter (Signed)
We received a fax from [facility] stating patient has requested they put the referral on hold at this time and she will reach out to their office if and when she is ready to move forward.

## 2022-12-09 ENCOUNTER — Inpatient Hospital Stay: Payer: Medicare PPO | Admitting: Internal Medicine

## 2022-12-09 DIAGNOSIS — I2102 ST elevation (STEMI) myocardial infarction involving left anterior descending coronary artery: Secondary | ICD-10-CM

## 2022-12-09 DIAGNOSIS — I480 Paroxysmal atrial fibrillation: Secondary | ICD-10-CM

## 2022-12-10 ENCOUNTER — Encounter: Payer: Self-pay | Admitting: Family Medicine

## 2022-12-10 ENCOUNTER — Ambulatory Visit: Payer: Medicare PPO | Admitting: Family Medicine

## 2022-12-10 VITALS — BP 132/80 | HR 53 | Temp 98.1°F | Ht 65.0 in | Wt 215.4 lb

## 2022-12-10 DIAGNOSIS — E66812 Obesity, class 2: Secondary | ICD-10-CM

## 2022-12-10 DIAGNOSIS — I2109 ST elevation (STEMI) myocardial infarction involving other coronary artery of anterior wall: Secondary | ICD-10-CM

## 2022-12-10 DIAGNOSIS — D6859 Other primary thrombophilia: Secondary | ICD-10-CM

## 2022-12-10 DIAGNOSIS — I739 Peripheral vascular disease, unspecified: Secondary | ICD-10-CM | POA: Diagnosis not present

## 2022-12-10 DIAGNOSIS — Z6835 Body mass index (BMI) 35.0-35.9, adult: Secondary | ICD-10-CM

## 2022-12-10 DIAGNOSIS — I480 Paroxysmal atrial fibrillation: Secondary | ICD-10-CM

## 2022-12-10 DIAGNOSIS — I2102 ST elevation (STEMI) myocardial infarction involving left anterior descending coronary artery: Secondary | ICD-10-CM

## 2022-12-10 DIAGNOSIS — K219 Gastro-esophageal reflux disease without esophagitis: Secondary | ICD-10-CM

## 2022-12-10 MED ORDER — PANTOPRAZOLE SODIUM 40 MG PO TBEC
40.0000 mg | DELAYED_RELEASE_TABLET | Freq: Every day | ORAL | 1 refills | Status: DC
Start: 2022-12-10 — End: 2023-06-27

## 2022-12-10 NOTE — Progress Notes (Signed)
I,Victoria T Deloria Lair, CMA,acting as a Neurosurgeon for [employer], NP.,have documented all relevant documentation on the behalf of Ellender Hose, NP,as directed by  Ellender Hose, NP while in the presence of Ellender Hose, NP.  Subjective:  Patient ID: Dawn Wood , female    DOB: 06-08-1945 , 77 y.o.   MRN: [id number]  Chief Complaint  Patient presents with   Hospitalization Follow-up    HPI  Patient presents today for hospital follow up. She was admitted at [facility] on 11/30/2022 for a STEMi of the anteroseptal wall and she had a stent placed , she was discharged on 12/04/2022.Patient had a previous stent placement s/p  NSTEMi of LAD in 2015. She states she  feeling okay, denies chest pain or dizziness She reports having an upcoming  appointment with cardiologist Dr Alverda Skeans , [facility] next Tuesday.    Patient admits that her sleep has been somewhat distorted because she has not being able to go pick up her CPAP, so she has been sleeping without it.  Patient needs to get her CPAP situated which will help her sleep better. Patient  uses a walker at home d/t knee surgery.       Past Medical History:  Diagnosis Date   Coronary artery disease    Diabetes mellitus without complication (HCC)    GERD (gastroesophageal reflux disease)    Hypertension    MI (myocardial infarction) (HCC)    Peripheral vascular disease (HCC)      Family History  Problem Relation Age of Onset   Hypertension Mother    Cancer Father    Hypertension Father    Breast cancer Sister 48   Aortic stenosis Daughter    Heart disease Daughter        before age 52   Thyroid disease Daughter    Sleep apnea Neg Hx      Current Outpatient Medications:    Accu-Chek Softclix Lancets lancets, USE TO CHECK BLOOD SUGARS ONCE DAILY., Disp: 100 each, Rfl: 12   apixaban (ELIQUIS) 5 MG TABS tablet, Take 1 tablet (5 mg total) by mouth 2 (two) times daily., Disp: 180 tablet, Rfl: 1   aspirin 81 MG  chewable tablet, Chew 1 tablet (81 mg total) by mouth daily., Disp: 30 tablet, Rfl: 0   BD PEN NEEDLE NANO 2ND GEN 32G X 4 MM MISC, USE AS DIRECTED TWICE DAILY, Disp: 100 each, Rfl: 2   Blood Glucose Monitoring Suppl (ACCU-CHEK GUIDE) w/Device KIT, USE TO CHECK BLOOD SUGARS ONCE DAILY., Disp: 1 kit, Rfl: 0   Cholecalciferol (VITAMIN D3) 2000 units TABS, Take 1 tablet by mouth daily., Disp: , Rfl:    clopidogrel (PLAVIX) 75 MG tablet, Take 1 tablet (75 mg total) by mouth daily with breakfast., Disp: 90 tablet, Rfl: 2   dorzolamide-timolol (COSOPT) 2-0.5 % ophthalmic solution, Place 1 drop into the right eye 2 (two) times daily., Disp: , Rfl:    Evolocumab (REPATHA SURECLICK) 140 MG/ML SOAJ, Inject 140 mg into the skin every 14 (fourteen) days., Disp: 6 mL, Rfl: 1   fluticasone (FLONASE) 50 MCG/ACT nasal spray, SHAKE LIQUID AND USE 1 SPRAY IN EACH NOSTRIL DAILY AS NEEDED FOR ALLERGIES OR RHINITIS, Disp: 16 g, Rfl: 2   glucose blood (ACCU-CHEK GUIDE) test strip, USE TO CHECK BLOOD SUGARS ONCE DAILY., Disp: 100 each, Rfl: 12   insulin aspart protamine - aspart (NOVOLOG MIX 70/30 FLEXPEN) (70-30) 100 UNIT/ML FlexPen, INJECT 40 UNITS SUBCUTANEOUS EVERY MORNING AND 50 UNITS  SUBCUTANEOUS AT DINNER, Disp: 75 mL, Rfl: 2   isosorbide mononitrate (IMDUR) 60 MG 24 hr tablet, Take 1 tablet (60 mg total) by mouth daily. Pt takes 1 tablet by mouth daily., Disp: 30 tablet, Rfl: 7   ketorolac (ACULAR) 0.5 % ophthalmic solution, Place 1 drop into the right eye 3 (three) times daily., Disp: , Rfl:    metoprolol succinate (TOPROL-XL) 100 MG 24 hr tablet, Take 1.5 tablets (150 mg total) by mouth daily. Take with or immediately following a meal., Disp: 135 tablet, Rfl: 3   nitroGLYCERIN (NITROSTAT) 0.4 MG SL tablet, Place 1 tablet (0.4 mg total) under the tongue every 5 (five) minutes as needed for chest pain., Disp: 25 tablet, Rfl: 11   olmesartan (BENICAR) 40 MG tablet, TAKE 1 TABLET(40 MG) BY MOUTH DAILY, Disp: 90 tablet,  Rfl: 2   ONETOUCH VERIO test strip, USE TO CHECK BLOOD SUGARS TWICE DAILY AS DIRECTED, Disp: 100 strip, Rfl: 3   sodium chloride (MURO 128) 5 % ophthalmic solution, Place 1 drop into the right eye 2 (two) times daily., Disp: , Rfl:    spironolactone (ALDACTONE) 25 MG tablet, Take 1 tablet (25 mg total) by mouth daily., Disp: 90 tablet, Rfl: 2   VYZULTA 0.024 % SOLN, Apply 1 drop to eye in the morning, at noon, and at bedtime., Disp: , Rfl:    colchicine 0.6 MG tablet, Take 0.6 mg by mouth as needed (for gout)., Disp: , Rfl:    furosemide (LASIX) 20 MG tablet, Take 1 tablet (20 mg total) by mouth daily., Disp: 30 tablet, Rfl: 1   pantoprazole (PROTONIX) 40 MG tablet, Take 1 tablet (40 mg total) by mouth daily., Disp: 90 tablet, Rfl: 1   potassium chloride (KLOR-CON) 10 MEQ tablet, Take 1 tablet (10 mEq total) by mouth daily., Disp: 40 tablet, Rfl: 1   Semaglutide,0.25 or 0.5MG /DOS, (OZEMPIC, 0.25 OR 0.5 MG/DOSE,) 2 MG/1.5ML SOPN, INJECT 0.5MG  INTO SKIN ONCE A WEEK, Disp: 4.5 mL, Rfl: 3   traMADol (ULTRAM) 50 MG tablet, Take 50 mg by mouth daily at 6 (six) AM., Disp: , Rfl:    Allergies  Allergen Reactions   Dust Mite Extract Other (See Comments)    REACTION: SNEEZING   Other Other (See Comments)    ALLERGEN: DEODORIZERS SPRAY AND PERFUMES REACTION: SNEEZING   Statins Other (See Comments)    Muscle aches with rosuvastatin 5 mg daily and 3x/weekly, atorvastatin 20 mg daily and 3x/week, simvastatin 40 mg daily    Allopurinol     Diarrhea and the pt said it made her sugars low   Pollen Extract Other (See Comments)    Reaction unknown     Review of Systems  Constitutional: Negative.   HENT: Negative.    Respiratory: Negative.    Cardiovascular:  Positive for leg swelling.  Endocrine: Negative for polydipsia, polyphagia and polyuria.  Musculoskeletal:  Positive for arthralgias.       Uses a walker at home  Neurological: Negative.   Psychiatric/Behavioral: Negative.       Today's  Vitals   12/10/22 1149 12/10/22 1217  BP: (!) 160/90 132/80  Pulse: (!) 53 (!) 53  Temp: 98.1 F (36.7 C)   SpO2: 94%   Weight: 215 lb 6.4 oz (97.7 kg)   Height: 5\' 5"  (1.651 m)    Body mass index is 35.84 kg/m.  Wt Readings from Last 3 Encounters:  12/14/22 220 lb 3.2 oz (99.9 kg)  12/10/22 215 lb 6.4 oz (97.7 kg)  12/01/22 199 lb 1.2 oz (90.3 kg)     Objective:  Physical Exam HENT:     Head: Normocephalic.  Cardiovascular:     Rate and Rhythm: Bradycardia present.     Comments: On metoprol 100 mg XL Q D. Abdominal:     General: Bowel sounds are normal.  Skin:    General: Skin is warm and dry.  Neurological:     Mental Status: She is alert.  Psychiatric:        Mood and Affect: Mood normal.        Behavior: Behavior normal.         Assessment And Plan:  Thrombophilia (HCC) Assessment & Plan: On eliquis 5 mg BID d/t PAF.   PVD (peripheral vascular disease) (HCC) Assessment & Plan: Chronic.Use TED stockings daily as directed.   STEMI involving oth coronary artery of anterior wall [facility]) Assessment & Plan: Admitted on 9/24 and d/c on 9/28. Had a stent placed.   Gastroesophageal reflux disease without esophagitis -     Pantoprazole Sodium; Take 1 tablet (40 mg total) by mouth daily.  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 Advised to aim for at 3-4 days of exercise per week or as tolerated.   Return for Keep scheduled appt.  Patient was given opportunity to ask questions. Patient verbalized understanding of the plan and was able to repeat key elements of the plan. All questions were answered to their satisfaction.  Ellender Hose, NP  I, Ellender Hose, NP, have reviewed all documentation for this visit. The documentation on 12/19/22 for the exam,  diagnosis, procedures, and orders are all accurate and complete.   IF YOU HAVE BEEN REFERRED TO A SPECIALIST, IT MAY TAKE 1-2 WEEKS TO SCHEDULE/PROCESS THE REFERRAL. IF YOU HAVE NOT HEARD FROM US/SPECIALIST IN TWO WEEKS, PLEASE GIVE [place] A CALL AT [phone] X [phone].   THE PATIENT IS ENCOURAGED TO PRACTICE SOCIAL DISTANCING DUE TO THE COVID-19 PANDEMIC.

## 2022-12-13 NOTE — Progress Notes (Unsigned)
Cardiology Office Note    Patient Name: Dawn Wood Date of Encounter: 12/14/2022  Primary Care Provider:  Dorothyann Peng, MD Primary Cardiologist:  Orbie Pyo, MD Primary Electrophysiologist: None   Past Medical History    Past Medical History:  Diagnosis Date   Coronary artery disease    Diabetes mellitus without complication (HCC)    GERD (gastroesophageal reflux disease)    Hypertension    MI (myocardial infarction) (HCC)    Peripheral vascular disease (HCC)     History of Present Illness  Dawn Wood is a 77 year old female with a PMH of CAD s/p NSTEMI treated with PCI/DES to LAD with 60% RCA and 40% LCx, HTN, HLD, PVD, DM type II, PAF (on Eliquis), arthritis who presents today for post PCI follow-up.  Dawn Wood was last seen on 11/25/2022 follow-up recent episode of chest pain.  During visit patient reported poor compliance with Imdur and ongoing discomfort of chest pain.  Imdur was increased to 60 mg daily and recommendation was made to repeat Lexiscan if pain increased with Imdur.  She developed sharp midsternal pain while at a car [employer] and was taken to the ED and found to have an anterior STEMI.  She was taken emergently to the Cath Lab and LHC was completed with successful PCI of proximal LAD and attempted PCI of mid RCA but unable to cross wire however balloon angioplasty was completed with minimal flow.  Patient could have CTO of the RCA if refractory persist.  He was discharged on triple therapy with Plavix 75 mg, ASA 81 mg and Eliquis 5 mg twice daily for 1 month.  She will have ASA 81 mg discontinued and 1 month.  2D echo was also completed with EF of 60 to 65% severe LVH with grade 1 DD, trivial MVR.   During today's visit the patient reports that she is currently not experiencing any chest pain following her latest heart catheterization.  She does endorse increased shortness of breath with activity and is unfortunately volume up on  examination today.  She reports indiscretions with salt in her diet and has not been drinking an excessive amount of water.  Her blood pressure today is also elevated at 152/63 and was 150/74 on recheck.  She reports compliance with her current medications and denies any adverse reactions.  During today's visit we discussed her latest heart catheterization and patient had all questions answered to her satisfaction.  She is interested in participating in cardiac rehab and will be provided clearance following today's visit.  Patient denies chest pain, palpitations, dyspnea, PND, orthopnea, nausea, vomiting, dizziness, syncope, edema, weight gain, or early satiety.  Review of Systems  Please see the history of present illness.    All other systems reviewed and are otherwise negative except as noted above.  Physical Exam    Wt Readings from Last 3 Encounters:  12/14/22 220 lb 3.2 oz (99.9 kg)  12/10/22 215 lb 6.4 oz (97.7 kg)  12/01/22 199 lb 1.2 oz (90.3 kg)   VS: Vitals:   12/14/22 1446 12/14/22 1456  BP: (!) 152/63 (!) 150/74  Pulse: 62   SpO2: 94%   ,Body mass index is 36.64 kg/m. GEN: Well nourished, well developed in no acute distress Neck: No JVD; No carotid bruits Pulmonary: Clear to auscultation without rales, wheezing or rhonchi  Cardiovascular: Normal rate. Regular rhythm. Normal S1. Normal S2.   Murmurs: There is no murmur.  ABDOMEN: Soft, non-tender, non-distended EXTREMITIES:  No  edema; No deformity   EKG/LABS/ Recent Cardiac Studies   ECG personally reviewed by me today -none completed today  Risk Assessment/Calculations:    CHA2DS2-VASc Score = 6   This indicates a 9.7% annual risk of stroke. The patient's score is based upon: CHF History: 0 HTN History: 1 Diabetes History: 1 Stroke History: 0 Vascular Disease History: 1 Age Score: 2 Gender Score: 1         Lab Results  Component Value Date   WBC 9.3 12/04/2022   HGB 13.1 12/04/2022   HCT 39.9  12/04/2022   MCV 87.3 12/04/2022   PLT 198 12/04/2022   Lab Results  Component Value Date   CREATININE 1.19 (H) 12/04/2022   BUN 31 (H) 12/04/2022   NA 134 (L) 12/04/2022   K 4.0 12/04/2022   CL 104 12/04/2022   CO2 21 (L) 12/04/2022   Lab Results  Component Value Date   CHOL 170 12/01/2022   HDL 36 (L) 12/01/2022   LDLCALC 109 (H) 12/01/2022   LDLDIRECT 83 06/11/2019   TRIG 123 12/01/2022   CHOLHDL 4.7 12/01/2022    Lab Results  Component Value Date   HGBA1C 7.1 (H) 11/30/2022   Assessment & Plan    1.  Coronary artery disease: -s/p NSTEMI 2015 treated with DES to LAD, anterior STEMI with PCI to proximal LAD and angioplasty to mid RCA due to inability to cross lesion with wire -Today patient reports no chest pain but does note shortness of breath with exertion. -Continue ASA 81 mg, Plavix 75 mg -Patient will discontinue aspirin 81 mg and 1 month. -Continue Repatha 140 mg q. 14 days, Toprol-XL 150 mg daily, Imdur 60 mg daily -Patient is cleared to begin cardiac rehab.  2.  Essential hypertension: -HYPERTENSION CONTROL Vitals:   12/14/22 1446 12/14/22 1456  BP: (!) 152/63 (!) 150/74    The patient's blood pressure is elevated above target today.  In order to address the patient's elevated BP: Blood pressure will be monitored at home to determine if medication changes need to be made.; Follow up with general cardiology has been recommended.   -Patient will continue to monitor blood pressures and will contact our office on Friday with results. -We will plan to increase blood pressure medication if pressures are still elevated. -Patient advised to avoid excess salt and follow a Dash type diet.  3.  Paroxysmal AF: -Today patient is sinus rhythm with controlled rate of 60 to -Patient's last creatinine was 1.1 hemoglobin was 13.1 -Continue Eliquis 5 mg twice daily -CHA2DS2-VASc Score = 6 [CHF History: 0, HTN History: 1, Diabetes History: 1, Stroke History: 0, Vascular  Disease History: 1, Age Score: 2, Gender Score: 1].  Therefore, the patient's annual risk of stroke is 9.7 %.      4.  Bilateral lower extremity edema: -Patient's lower extremities are edematous with +2 bilateral edema and increased shortness of breath. -Most recent 2D echo was completed showing normal EF of 60 to 65% -Patient will increase Lasix to 40 mg twice daily x 3 days and then 20 mg daily -She will take potassium 20 mEq x 3 days and then 10 mEq daily -BMET and 1 week -Patient advised to weigh herself daily and to monitor her salt intake.  5.  IDDM type II: -Patient's last hemoglobin A1c was 7.1 -Continue current treatment plan per PCP     Cardiac Rehabilitation Eligibility Assessment       Disposition: Follow-up with Orbie Pyo, MD or APP  in 1 months    Signed, Napoleon Form, Leodis Rains, NP 12/14/2022, 5:27 PM [facility]

## 2022-12-14 ENCOUNTER — Ambulatory Visit: Payer: Medicare PPO | Attending: Nurse Practitioner | Admitting: Nurse Practitioner

## 2022-12-14 ENCOUNTER — Encounter: Payer: Self-pay | Admitting: Nurse Practitioner

## 2022-12-14 VITALS — BP 150/74 | HR 62 | Ht 65.0 in | Wt 220.2 lb

## 2022-12-14 DIAGNOSIS — I480 Paroxysmal atrial fibrillation: Secondary | ICD-10-CM

## 2022-12-14 DIAGNOSIS — E785 Hyperlipidemia, unspecified: Secondary | ICD-10-CM | POA: Diagnosis not present

## 2022-12-14 DIAGNOSIS — N1831 Chronic kidney disease, stage 3a: Secondary | ICD-10-CM

## 2022-12-14 DIAGNOSIS — I2510 Atherosclerotic heart disease of native coronary artery without angina pectoris: Secondary | ICD-10-CM | POA: Diagnosis not present

## 2022-12-14 DIAGNOSIS — Z794 Long term (current) use of insulin: Secondary | ICD-10-CM

## 2022-12-14 DIAGNOSIS — I10 Essential (primary) hypertension: Secondary | ICD-10-CM | POA: Diagnosis not present

## 2022-12-14 DIAGNOSIS — E1122 Type 2 diabetes mellitus with diabetic chronic kidney disease: Secondary | ICD-10-CM

## 2022-12-14 MED ORDER — POTASSIUM CHLORIDE ER 10 MEQ PO TBCR: 10.0000 meq | EXTENDED_RELEASE_TABLET | Freq: Every day | ORAL | 1 refills | Status: DC

## 2022-12-14 MED ORDER — FUROSEMIDE 20 MG PO TABS: 20.0000 mg | ORAL_TABLET | Freq: Every day | ORAL | 1 refills | Status: DC

## 2022-12-14 NOTE — Patient Instructions (Addendum)
Medication Instructions:  INCREASE Lasix to 20mg  1 tablet twice a day for 3 days then once a day  START Potassium take 2 tablet daily for 3 days then take 1 tablet daily  *If you need a refill on your cardiac medications before your next appointment, please call your pharmacy*   Lab Work: 1 WEEK BMET If you have labs (blood work) drawn today and your tests are completely normal, you will receive your results only by: MyChart Message (if you have MyChart) OR A paper copy in the mail If you have any lab test that is abnormal or we need to change your treatment, we will call you to review the results.   Testing/Procedures: NONE ORDERED   Follow-Up: At [facility], you and your health needs are our priority.  As part of our continuing mission to provide you with exceptional heart care, we have created designated Provider Care Teams.  These Care Teams include your primary Cardiologist (physician) and Advanced Practice Providers (APPs -  Physician Assistants and Nurse Practitioners) who all work together to provide you with the care you need, when you need it.  We recommend signing up for the patient portal called "MyChart".  Sign up information is provided on this After Visit Summary.  MyChart is used to connect with patients for Virtual Visits (Telemedicine).  Patients are able to view lab/test results, encounter notes, upcoming appointments, etc.  Non-urgent messages can be sent to your provider as well.   To learn more about what you can do with MyChart, go to [email or web address].    Your next appointment:   1 month(s)  Provider:   Robin Searing, NP       Other Instructions  Please check your weight daily. Please contact the office if you gain more than 2lbs in a day or 5lbs in a week.  Limit your salt intake to 1500-2000mg  per day or 500mg  of Sodium per meal.  Check your blood pressure daily for 2 weeks, then contact the office with your readings.  Make sure to  check 2 hours after your medications.   AVOID these things for 30 minutes before checking your blood pressure: No Drinking caffeine. No Drinking alcohol. No Eating. No Smoking. No Exercising.  Five minutes before checking your blood pressure: Pee. Sit in a dining chair. Avoid sitting in a soft couch or armchair. Be quiet. Do not talk.

## 2022-12-15 ENCOUNTER — Telehealth (HOSPITAL_COMMUNITY): Payer: Self-pay

## 2022-12-15 NOTE — Telephone Encounter (Signed)
Per pt, she is unable to schedule cardiac rehab at this time per her cardiologist.   Closed referral.

## 2022-12-20 DIAGNOSIS — I2109 ST elevation (STEMI) myocardial infarction involving other coronary artery of anterior wall: Secondary | ICD-10-CM | POA: Insufficient documentation

## 2022-12-20 DIAGNOSIS — D6859 Other primary thrombophilia: Secondary | ICD-10-CM | POA: Insufficient documentation

## 2022-12-20 NOTE — Assessment & Plan Note (Signed)
Admitted on 9/24 and d/c on 9/28. Had a stent placed.

## 2022-12-20 NOTE — Assessment & Plan Note (Signed)
Chronic.Use TED stockings daily as directed.

## 2022-12-20 NOTE — Assessment & Plan Note (Signed)
On eliquis 5 mg BID d/t PAF.

## 2022-12-20 NOTE — Assessment & Plan Note (Addendum)
She is encouraged to strive for BMI less than 30 to decrease cardiac risk. Advised to aim for at 3-4 days of exercise per week or as tolerated.

## 2022-12-21 ENCOUNTER — Ambulatory Visit: Payer: Medicare PPO | Attending: Nurse Practitioner

## 2022-12-21 DIAGNOSIS — E785 Hyperlipidemia, unspecified: Secondary | ICD-10-CM | POA: Diagnosis not present

## 2022-12-21 DIAGNOSIS — I10 Essential (primary) hypertension: Secondary | ICD-10-CM

## 2022-12-21 DIAGNOSIS — Z794 Long term (current) use of insulin: Secondary | ICD-10-CM | POA: Diagnosis not present

## 2022-12-21 DIAGNOSIS — E1122 Type 2 diabetes mellitus with diabetic chronic kidney disease: Secondary | ICD-10-CM

## 2022-12-21 DIAGNOSIS — I2510 Atherosclerotic heart disease of native coronary artery without angina pectoris: Secondary | ICD-10-CM

## 2022-12-21 DIAGNOSIS — N1831 Chronic kidney disease, stage 3a: Secondary | ICD-10-CM | POA: Diagnosis not present

## 2022-12-21 DIAGNOSIS — I480 Paroxysmal atrial fibrillation: Secondary | ICD-10-CM | POA: Diagnosis not present

## 2022-12-22 ENCOUNTER — Other Ambulatory Visit: Payer: Self-pay

## 2022-12-22 DIAGNOSIS — I480 Paroxysmal atrial fibrillation: Secondary | ICD-10-CM

## 2022-12-22 DIAGNOSIS — Z79899 Other long term (current) drug therapy: Secondary | ICD-10-CM

## 2022-12-22 LAB — BASIC METABOLIC PANEL
BUN/Creatinine Ratio: 13 (ref 12–28)
BUN: 17 mg/dL (ref 8–27)
CO2: 23 mmol/L (ref 20–29)
Calcium: 8.8 mg/dL (ref 8.7–10.3)
Chloride: 106 mmol/L (ref 96–106)
Creatinine, Ser: 1.31 mg/dL — ABNORMAL HIGH (ref 0.57–1.00)
Glucose: 49 mg/dL — ABNORMAL LOW (ref 70–99)
Potassium: 3.8 mmol/L (ref 3.5–5.2)
Sodium: 144 mmol/L (ref 134–144)
eGFR: 42 mL/min/{1.73_m2} — ABNORMAL LOW (ref >59)

## 2022-12-22 MED ORDER — METOPROLOL SUCCINATE ER 100 MG PO TB24: 150.0000 mg | ORAL_TABLET | Freq: Every day | ORAL | 3 refills | Status: DC

## 2022-12-29 ENCOUNTER — Telehealth: Payer: Self-pay | Admitting: Internal Medicine

## 2022-12-29 ENCOUNTER — Ambulatory Visit: Payer: Medicare PPO

## 2022-12-29 ENCOUNTER — Other Ambulatory Visit: Payer: Self-pay

## 2022-12-29 NOTE — Telephone Encounter (Signed)
Pt says her "gout is flared up" and this is the worse she has ever had it... it is in both feet and very painful... she is unable to get ot the lab this week as planned for her repeat BMET... she is not sure if it is fluid buildup or just the gout... she is asking what to do with her fluid pills in the meantime.. she is unsure when she will make it back her for labs... based on how long her flare up is... I strongly urged her to talk with her PCP.Marland Kitchen she is on the colchicine PRN but I advised her to talk with Dr Lelon Perla about any changes for now.

## 2022-12-29 NOTE — Telephone Encounter (Signed)
Patient is calling in having to cancel her labs for today due to gout making it unable for her to get a shoe on or walk right now.   She did not reschedule due to not knowing when she would be able to come in for the repeated labs.  In the meantime she is requesting a callback from a nurse to be advised on what to do regarding her abnormal results until she can test again.   Please advise.

## 2022-12-29 NOTE — Telephone Encounter (Signed)
  Please advise [name] to hold her Lasix for now and to follow-up with her PCP for guidance on gout treatment.  It sounds like her gout may have been flared by the increase in Lasix therefore it is important for her to weigh herself and abstain from excess salt.  Please also remind her to complete her labs whenever possible in order to monitor her kidney function.  Please let me know if have any further questions.  Dawn Searing, NP     Pt advised and verbalized understanding.

## 2023-01-06 ENCOUNTER — Ambulatory Visit: Payer: Medicare PPO | Attending: Nurse Practitioner

## 2023-01-06 ENCOUNTER — Other Ambulatory Visit: Payer: Self-pay | Admitting: Internal Medicine

## 2023-01-06 DIAGNOSIS — I2510 Atherosclerotic heart disease of native coronary artery without angina pectoris: Secondary | ICD-10-CM | POA: Diagnosis not present

## 2023-01-06 DIAGNOSIS — I480 Paroxysmal atrial fibrillation: Secondary | ICD-10-CM | POA: Diagnosis not present

## 2023-01-06 DIAGNOSIS — Z79899 Other long term (current) drug therapy: Secondary | ICD-10-CM

## 2023-01-06 DIAGNOSIS — G4733 Obstructive sleep apnea (adult) (pediatric): Secondary | ICD-10-CM | POA: Diagnosis not present

## 2023-01-06 DIAGNOSIS — R600 Localized edema: Secondary | ICD-10-CM | POA: Diagnosis not present

## 2023-01-06 DIAGNOSIS — I10 Essential (primary) hypertension: Secondary | ICD-10-CM | POA: Diagnosis not present

## 2023-01-07 ENCOUNTER — Telehealth: Payer: Self-pay | Admitting: Internal Medicine

## 2023-01-07 LAB — BASIC METABOLIC PANEL
BUN/Creatinine Ratio: 15 (ref 12–28)
BUN: 17 mg/dL (ref 8–27)
CO2: 24 mmol/L (ref 20–29)
Calcium: 9.2 mg/dL (ref 8.7–10.3)
Chloride: 109 mmol/L — ABNORMAL HIGH (ref 96–106)
Creatinine, Ser: 1.11 mg/dL — ABNORMAL HIGH (ref 0.57–1.00)
Glucose: 54 mg/dL — ABNORMAL LOW (ref 70–99)
Potassium: 3.8 mmol/L (ref 3.5–5.2)
Sodium: 145 mmol/L — ABNORMAL HIGH (ref 134–144)
eGFR: 51 mL/min/{1.73_m2} — ABNORMAL LOW (ref >59)

## 2023-01-07 NOTE — Telephone Encounter (Signed)
Spoke with the patient who states she did not mention to anyone anything about the Eliquis. Patient states is experiencing some shortness of breath. Patient states she has been short winded for a while, even before her heart attack. Patient states she mentioned that to [facility]. Patient states she does not know what is causing the shortness of breath. Patient states her breathing wakes her up at night and she has to go outside to cath her breath (2-3am). Patient states the shortness of breath has gotten worse since her heart attack. She states she can not do anything because of the shortness of breath.   Patient states she is using the restroom too much are you sure you want her to restart the lasix?   Patient also states she is having a dry cough. I advised the patient to reach out to her primary care provider about her cough for further evaluation.   Patient states she has one refill left on her lasix and does not need a new rx sent to the pharmacy.   Patient states ever since she retired her health has went down hill and she is not getting the any rest. She states sometimes she has trouble sleeping.

## 2023-01-07 NOTE — Telephone Encounter (Signed)
Calling to speak to the nurse about medication. Please advise

## 2023-01-07 NOTE — Addendum Note (Signed)
Addended byMoshe Salisbury, Sabastien Tyler E on: 01/07/2023 10:32 AM   Modules accepted: Level of Service

## 2023-01-07 NOTE — Telephone Encounter (Signed)
   Gaston Islam., NP  You56 minutes ago (11:30 AM)    Please advise [name] that her Eliquis is not contributing to her swelling and that she has been on this medication for quite some time.  The swelling that she is mentioning usually occurs once the medication is initially started.  We will have her restart Lasix 20 mg once daily and advised her to watch her salt intake and to maintain 65 ounces of water daily.  It is important that she keep her salt intake to 1500 mg to 2000 mg daily.  We advised strongly against you stopping Eliquis due to the risk of stroke.  Please let us know if you have any further questions.  Robin Searing, NP

## 2023-01-10 ENCOUNTER — Other Ambulatory Visit: Payer: Self-pay

## 2023-01-10 NOTE — Telephone Encounter (Addendum)
Patient states her arm, leg, and whole right side of her body is swollen after having the stent was placed. I tried to ask the patient when did this start and she responded with she told the person she spoke with on Friday and did not want to repeat herself. Patient then got upset because she states she did not have to see so many doctors before she retired and does not understand why she need to see a pulmonologist.   I tried to ask the patient additional questions about what is going on and if he has reached out to any of her other doctors but she states she has not. Patient would not give me any more information she just repeated that she does not understand why she needs to see so many doctors since she retired.   I advised the patient of Dawn Wood recommendations but she then stated again why does she have to see another doctor.   Dawn Wood was standing beside me and recommended the patient check her weight daily and ask if she is having amy chest pain.   Patient states she is checking her weight daily with no change but she is having chest pain.   Dawn Wood states she could be in AFIB and advised a ov this week.   Patient contacted and scheduled with Dawn Wood, for 01/12/23 patient agreeable and voiced understanding.

## 2023-01-10 NOTE — Telephone Encounter (Signed)
Gaston Islam., NP  You3 days ago    Please advise to follow up with PCP and to discuss a referral to pulmonology. It is also important to watch your salt and follow a heart healthy diet.  Alden Server

## 2023-01-11 NOTE — Progress Notes (Unsigned)
Office Visit    Patient Name: Dawn Wood Date of Encounter: 01/11/2023  PCP:  Dorothyann Peng, MD   [facility]  Cardiologist:  Orbie Pyo, MD  Advanced Practice Provider:  No care team member to display Electrophysiologist:  None   HPI    Dawn Wood is a 77 y.o. female with a past medical history of CAD prior LAD DES stent (2015), hypertension, DM 2, PAD, osteoarthritis, gouty arthritis, and hyperlipidemia presents today for follow-up appointment.  She was last seen 01/14/2022 and was having symptoms of irregular feelings in her chest related to atrial fibrillation based on a recent monitor.  No angina or sublingual nitro use.  I saw her 03/2022, she states that she had recent eye surgery and she is coming off of some steroids.  She does have some lower extremity edema.  She had some chest soreness for the last 3 to 4 days which can be reproduced by pushing on her chest.  She has not done any exercise that would lead to chest wall soreness.  She does state that when she leans back it significantly worse.  She did have a stent placed back in 2015 and that was her last echocardiogram.  This does not feel like the pain that she was having when she had her stent.  Nothing makes the pain subside.  She states its about a 7 out of 10.  We gave her a sublingual nitro here in the clinic.  She states that she had taken 1 at home in the past and had not really helped.  She had been on colchicine before for gout.  I discussed her case with Dr. Elease Hashimoto who also spoke with the patient today.  EKG unremarkable.  Today, she presents with a history of heart attack, hypertension, gout, and sleep apnea, with multiple complaints. The chief complaint is swelling in the legs and hands, which has been present for two weeks. The patient also reports shortness of breath, which has worsened since starting additional blood thinning medication. The shortness of breath is  severe enough to prevent the patient from putting on socks without becoming winded and causes the patient to wake up in the middle of the night to get fresh air. The patient also reports chest pain on two occasions since her heart attack, which was relieved with one nitroglycerin each time. The patient has difficulty sleeping, often waking up suddenly and short of breath. The patient also reports pain and swelling in the right arm, where a previous IV was placed. The patient has a history of gout and was taking colchicine, but stopped due to concerns about kidney damage. The patient has restarted Lasix, which was previously stopped due to a potential gout flare-up.  Her BP was elevated during our encounter today and was 180/92 then on repeat 182/88. She took her medications right before her appointment. She does not want to be on any more medications but she is maxed out on her Omesartan and her Imdur was just increased.   No PND. Reports no palpitations.   Discussed the use of AI scribe software for clinical note transcription with the patient, who gave verbal consent to proceed.   Past Medical History    Past Medical History:  Diagnosis Date   Coronary artery disease    Diabetes mellitus without complication (HCC)    GERD (gastroesophageal reflux disease)    Hypertension    MI (myocardial infarction) (HCC)  Peripheral vascular disease [facility])    Past Surgical History:  Procedure Laterality Date   ABDOMINAL HYSTERECTOMY  1985   APPENDECTOMY     CATARACT EXTRACTION Left 02/20/2018   Dr. Harlon Flor   CATARACT EXTRACTION Bilateral 2024   cataract surgery Right 01/25/2022   Second at [facility] 11/29   CORONARY STENT INTERVENTION N/A 11/30/2022   Procedure: CORONARY STENT INTERVENTION;  Surgeon: Swaziland, Peter M, MD;  Location: [facility] INVASIVE CV LAB;  Service: Cardiovascular;  Laterality: N/A;  LAD, RCA   CORONARY STENT PLACEMENT  2015   CORONARY ULTRASOUND/IVUS N/A 11/30/2022   Procedure: Coronary  Ultrasound/IVUS;  Surgeon: Swaziland, Peter M, MD;  Location: [facility] INVASIVE CV LAB;  Service: Cardiovascular;  Laterality: N/A;  LAD, RCA   ectopic pregnancy--unilateral salpingectomy  1980   KNEE ARTHROSCOPY Left 03/2016   LEFT HEART CATH AND CORONARY ANGIOGRAPHY N/A 11/30/2022   Procedure: LEFT HEART CATH AND CORONARY ANGIOGRAPHY;  Surgeon: Swaziland, Peter M, MD;  Location: [facility] INVASIVE CV LAB;  Service: Cardiovascular;  Laterality: N/A;   unilateral oophorectomy      Allergies  Allergies  Allergen Reactions   Dust Mite Extract Other (See Comments)    REACTION: SNEEZING   Other Other (See Comments)    ALLERGEN: DEODORIZERS SPRAY AND PERFUMES REACTION: SNEEZING   Statins Other (See Comments)    Muscle aches with rosuvastatin 5 mg daily and 3x/weekly, atorvastatin 20 mg daily and 3x/week, simvastatin 40 mg daily    Allopurinol     Diarrhea and the pt said it made her sugars low   Pollen Extract Other (See Comments)    Reaction unknown     EKGs/Labs/Other Studies Reviewed:   The following studies were reviewed today:  Cath: 12/01/2022   Fluoro time: 35.1 (min) DAP: 14315 (mGycm2) Cumulative Air Kerma: 2496 (mGy)       Ost LAD to Prox LAD lesion is 90% stenosed.   1st Mrg lesion is 60% stenosed.   Dist Cx lesion is 70% stenosed.   Prox RCA to Mid RCA lesion is 99% stenosed.   Previously placed Prox LAD stent of unknown type is  widely patent.   A drug-eluting stent was successfully placed using a SYNERGY XD 3.50X12.   Balloon angioplasty was performed using a BALLN EMERGE MR 2.5X12.   Post intervention, there is a 0% residual stenosis.   Post intervention, there is a 0% residual stenosis.   Post intervention, there is a 99% residual stenosis.   LV end diastolic pressure is mildly elevated.   Recommend to resume Apixaban, at currently prescribed dose and frequency on 12/01/2022.   Recommend concurrent antiplatelet therapy of Aspirin 81 mg for 1 month and Clopidogrel 75mg  daily for  12 months .   2 vessel obstructive CAD. Culprit lesion is proximal LAD at prior stent margin. CTO of the mid RCA Mildly elevated LVEDP Successful PCI of the proximal LAD with DES x 1 Attempted PCI of the mid RCA. Able to cross with wire and POBA was performed with only minimal improvement in flow. Unable to deliver a stent   Plan: DAPT with ASA for one month, Plavix for 12 months. May resume Eliquis tomorrow if no bleeding. Would treat RCA disease medically. If she has refractory angina could consider dedicated CTO procedure from femoral access.    Diagnostic Dominance: Right  Intervention      Lexiscan Myoview 06/05/2021    The study is normal. The study is low risk.   No ST deviation was noted.  Left ventricular function is normal. Nuclear stress EF: 69 %. The left ventricular ejection fraction is hyperdynamic (>65%). End diastolic cavity size is normal.   Prior study available for comparison from 10/22/1998.      Cardiac monitor 11/10/21   Basic rhythm is NSR with average HR 77 bpm   104 SVT runs with longest 1.5 minutes   Atrial fibrillation burden < 1% with longest lasting 13.5 minutes at average HR 148 bpm   One symptomatic episode correlated with AF     Patch Wear Time:  14 days and 0 hours (2023-09-29T10:13:25-0400 to 2023-10-13T10:13:29-0400)   Patient had a min HR of 49 bpm, max HR of 197 bpm, and avg HR of 77 bpm. Predominant underlying rhythm was Sinus Rhythm. 104 Supraventricular Tachycardia runs occurred, the run with the fastest interval lasting 11.7 secs with a max rate of 197 bpm, the  longest lasting 1 min 22 secs with an avg rate of 145 bpm. Atrial Fibrillation/Flutter occurred (<1% burden), ranging from 86-186 bpm (avg of 138 bpm), the longest lasting 13 mins 31 secs with an avg rate of 148 bpm. Atrial Fibrillation/Flutter was  detected within +/- 45 seconds of symptomatic patient event(s). Isolated SVEs were rare (<1.0%), SVE Couplets were rare (<1.0%), and SVE  Triplets were rare (<1.0%). Isolated VEs were rare (<1.0%, 2298), VE Couplets were rare (<1.0%, 42), and VE Triplets  were rare (<1.0%, 2). Ventricular Bigeminy and Trigeminy were present.     EKG:  EKG is  ordered today.  The ekg ordered today demonstrates sinus bradycardia and LVH  Recent Labs: 05/10/2022: NT-Pro BNP 70 10/21/2022: BNP 88.3; TSH 0.999 11/30/2022: ALT 15 12/02/2022: Magnesium 2.6 12/04/2022: Hemoglobin 13.1; Platelets 198 01/06/2023: BUN 17; Creatinine, Ser 1.11; Potassium 3.8; Sodium 145  Recent Lipid Panel    Component Value Date/Time   CHOL 170 12/01/2022 0659   CHOL 195 05/04/2022 1155   CHOL 167 05/28/2013 0413   TRIG 123 12/01/2022 0659   TRIG 180 05/28/2013 0413   HDL 36 (L) 12/01/2022 0659   HDL 49 05/04/2022 1155   HDL 37 (L) 05/28/2013 0413   CHOLHDL 4.7 12/01/2022 0659   VLDL 25 12/01/2022 0659   VLDL 36 05/28/2013 0413   LDLCALC 109 (H) 12/01/2022 0659   LDLCALC 121 (H) 05/04/2022 1155   LDLCALC 94 05/28/2013 0413   LDLDIRECT 83 06/11/2019 1425    Home Medications   No outpatient medications have been marked as taking for the 01/12/23 encounter (Appointment) with Sharlene Dory, PA-C.     Review of Systems      All other systems reviewed and are otherwise negative except as noted above.  Physical Exam    VS:  There were no vitals taken for this visit. , BMI There is no height or weight on file to calculate BMI.  Wt Readings from Last 3 Encounters:  12/14/22 220 lb 3.2 oz (99.9 kg)  12/10/22 215 lb 6.4 oz (97.7 kg)  12/01/22 199 lb 1.2 oz (90.3 kg)     GEN: Well nourished, well developed, in no acute distress. HEENT: normal. Neck: Supple, no JVD, carotid bruits, or masses. Cardiac: RRR, no murmurs, rubs, or gallops. No clubbing, cyanosis, edema.  Radials/PT 2+ and equal bilaterally.  Respiratory:  Respirations regular and unlabored, clear to auscultation bilaterally. GI: Soft, nontender, nondistended. MS: No deformity or atrophy. Skin:  Warm and dry, no rash. Neuro:  Strength and sensation are intact. Psych: Normal affect.  Assessment & Plan    Paroxysmal  atrial fibrillation -brief episode on monitor -continue Metprolol 150mg  daily -She is on DAPT for 6 months (Feb 2025)  CAD with recent PCI -2 episodes of chest pain relieved with 1 nitroglycerin tab -continue current medication regimen   Hypertension Elevated blood pressure readings. Currently on max dose of Olmesartan. -She takes Imdur 60mg  daily -suggested adding Norvasc 5mg  daily but patient does not want to take due to concern for her kidneys  -recommend new BP cuff and daily monitoring -There is a component of diastolic dysfunction consider adding Entresto and stopping her Benicar at next visit  Fluid Overload Bilateral lower extremity edema and shortness of breath. Likely contributing to patient's dyspnea and difficulty sleeping. -Increase Lasix to 40mg  daily for one week, then decrease to 20mg  daily. -Check BNP and renal function in one week.  Anticoagulation On dual antiplatelet therapy (Aspirin and Plavix) post-stent placement. Experiencing bruising and discomfort with insulin injections. -Continue Aspirin and Plavix for six months post-stent placement to reduce risk of stent thrombosis.  Sleep Apnea Patient reports difficulty sleeping and waking up gasping for air. -Encourage use of CPAP machine once received.  Chest Pain Reports of occasional chest pain relieved by nitroglycerin. -Continue monitoring. If frequency increases or more nitroglycerin is needed, further evaluation required.  Possible Gout Bilateral foot pain and swelling. Previously treated with Colchicine. -Check uric acid level to confirm gout diagnosis.  Possible Thyroid Enlargement Patient reports difficulty swallowing and has been told of an enlarged thyroid. -Check thyroid function tests.  Right Arm Swelling Unilateral arm swelling, possible deep vein thrombosis  (DVT). -Order ultrasound of right arm to rule out DVT.    Disposition: Follow up 3-4 months with Orbie Pyo, MD or APP.  Signed, Sharlene Dory, PA-C 01/11/2023, 9:06 PM [facility]

## 2023-01-12 ENCOUNTER — Ambulatory Visit: Payer: Medicare PPO | Attending: Physician Assistant | Admitting: Physician Assistant

## 2023-01-12 ENCOUNTER — Encounter: Payer: Self-pay | Admitting: Physician Assistant

## 2023-01-12 ENCOUNTER — Other Ambulatory Visit: Payer: Self-pay | Admitting: *Deleted

## 2023-01-12 VITALS — BP 180/92 | HR 52 | Ht 65.0 in | Wt 222.4 lb

## 2023-01-12 DIAGNOSIS — I480 Paroxysmal atrial fibrillation: Secondary | ICD-10-CM

## 2023-01-12 DIAGNOSIS — I2510 Atherosclerotic heart disease of native coronary artery without angina pectoris: Secondary | ICD-10-CM

## 2023-01-12 DIAGNOSIS — N1831 Chronic kidney disease, stage 3a: Secondary | ICD-10-CM

## 2023-01-12 DIAGNOSIS — R079 Chest pain, unspecified: Secondary | ICD-10-CM

## 2023-01-12 DIAGNOSIS — I10 Essential (primary) hypertension: Secondary | ICD-10-CM

## 2023-01-12 DIAGNOSIS — Z79899 Other long term (current) drug therapy: Secondary | ICD-10-CM | POA: Diagnosis not present

## 2023-01-12 DIAGNOSIS — E785 Hyperlipidemia, unspecified: Secondary | ICD-10-CM | POA: Diagnosis not present

## 2023-01-12 DIAGNOSIS — Z794 Long term (current) use of insulin: Secondary | ICD-10-CM | POA: Diagnosis not present

## 2023-01-12 DIAGNOSIS — E1122 Type 2 diabetes mellitus with diabetic chronic kidney disease: Secondary | ICD-10-CM | POA: Diagnosis not present

## 2023-01-12 DIAGNOSIS — R609 Edema, unspecified: Secondary | ICD-10-CM

## 2023-01-12 MED ORDER — POTASSIUM CHLORIDE ER 10 MEQ PO TBCR: EXTENDED_RELEASE_TABLET | ORAL | Status: DC

## 2023-01-12 MED ORDER — FUROSEMIDE 20 MG PO TABS: ORAL_TABLET | ORAL | Status: DC

## 2023-01-12 MED ORDER — AMLODIPINE BESYLATE 5 MG PO TABS
5.0000 mg | ORAL_TABLET | Freq: Every day | ORAL | 3 refills | Status: DC
Start: 2023-01-12 — End: 2023-01-12

## 2023-01-12 NOTE — Patient Instructions (Addendum)
Medication Instructions:  Your physician has recommended you make the following change in your medication:   INCREASE Lasix to 20 taking 2 tablets daily for 7 days then go back to 1 daily  INCREASE Potassium 10 meq to 2 tablets daily for 7 then reduce to 1 daily  START Amlodipine 5 mg taking 1 daily   *If you need a refill on your cardiac medications before your next appointment, please call your pharmacy*   Lab Work: TODAY: BMET, PRO BNP, URIC ACID, & TSH  01/19/23: COME TO LAB, AFTER 7:30 AM. FOR REPEAT:  BMET & PRO BNP  If you have labs (blood work) drawn today and your tests are completely normal, you will receive your results only by: MyChart Message (if you have MyChart) OR A paper copy in the mail If you have any lab test that is abnormal or we need to change your treatment, we will call you to review the results.   Testing/Procedures: Your physician has requested that you have a  upper extremity arterial duplex. This test is an ultrasound of the arteries in the legs or arms. It looks at arterial blood flow in the legs and arms. Allow one hour for Lower and Upper Arterial scans. There are no restrictions or special instructions.  Please note: We ask at that you not bring children with you during ultrasound (echo/ vascular) testing. Due to room size and safety concerns, children are not allowed in the ultrasound rooms during exams. Our front office staff cannot provide observation of children in our lobby area while testing is being conducted. An adult accompanying a patient to their appointment will only be allowed in the ultrasound room at the discretion of the ultrasound technician under special circumstances. We apologize for any inconvenience.    Follow-Up: At [facility], you and your health needs are our priority.  As part of our continuing mission to provide you with exceptional heart care, we have created designated Provider Care Teams.  These Care Teams include  your primary Cardiologist (physician) and Advanced Practice Providers (APPs -  Physician Assistants and Nurse Practitioners) who all work together to provide you with the care you need, when you need it.  We recommend signing up for the patient portal called "MyChart".  Sign up information is provided on this After Visit Summary.  MyChart is used to connect with patients for Virtual Visits (Telemedicine).  Patients are able to view lab/test results, encounter notes, upcoming appointments, etc.  Non-urgent messages can be sent to your provider as well.   To learn more about what you can do with MyChart, go to [email or web address].    Your next appointment:   As scheduled    Provider:   Dr. Carolan Clines  Other Instructions

## 2023-01-13 ENCOUNTER — Telehealth: Payer: Self-pay | Admitting: Physician Assistant

## 2023-01-13 ENCOUNTER — Ambulatory Visit: Payer: Medicare PPO | Admitting: Family Medicine

## 2023-01-13 ENCOUNTER — Encounter: Payer: Self-pay | Admitting: Family Medicine

## 2023-01-13 VITALS — BP 182/80 | HR 69 | Temp 98.1°F | Ht 65.0 in | Wt 217.0 lb

## 2023-01-13 DIAGNOSIS — N1831 Chronic kidney disease, stage 3a: Secondary | ICD-10-CM

## 2023-01-13 DIAGNOSIS — E1122 Type 2 diabetes mellitus with diabetic chronic kidney disease: Secondary | ICD-10-CM

## 2023-01-13 DIAGNOSIS — R600 Localized edema: Secondary | ICD-10-CM

## 2023-01-13 DIAGNOSIS — R0602 Shortness of breath: Secondary | ICD-10-CM

## 2023-01-13 DIAGNOSIS — E66812 Obesity, class 2: Secondary | ICD-10-CM | POA: Diagnosis not present

## 2023-01-13 DIAGNOSIS — I1310 Hypertensive heart and chronic kidney disease without heart failure, with stage 1 through stage 4 chronic kidney disease, or unspecified chronic kidney disease: Secondary | ICD-10-CM | POA: Diagnosis not present

## 2023-01-13 DIAGNOSIS — Z794 Long term (current) use of insulin: Secondary | ICD-10-CM

## 2023-01-13 DIAGNOSIS — Z79899 Other long term (current) drug therapy: Secondary | ICD-10-CM

## 2023-01-13 DIAGNOSIS — Z6836 Body mass index (BMI) 36.0-36.9, adult: Secondary | ICD-10-CM | POA: Diagnosis not present

## 2023-01-13 DIAGNOSIS — D6859 Other primary thrombophilia: Secondary | ICD-10-CM | POA: Diagnosis not present

## 2023-01-13 DIAGNOSIS — I739 Peripheral vascular disease, unspecified: Secondary | ICD-10-CM

## 2023-01-13 LAB — BASIC METABOLIC PANEL WITH GFR
BUN/Creatinine Ratio: 17 (ref 12–28)
BUN: 16 mg/dL (ref 8–27)
CO2: 23 mmol/L (ref 20–29)
Calcium: 9 mg/dL (ref 8.7–10.3)
Chloride: 106 mmol/L (ref 96–106)
Creatinine, Ser: 0.92 mg/dL (ref 0.57–1.00)
Glucose: 87 mg/dL (ref 70–99)
Potassium: 3.4 mmol/L — ABNORMAL LOW (ref 3.5–5.2)
Sodium: 141 mmol/L (ref 134–144)
eGFR: 64 mL/min/1.73

## 2023-01-13 LAB — TSH: TSH: 0.582 u[IU]/mL (ref 0.450–4.500)

## 2023-01-13 LAB — PRO B NATRIURETIC PEPTIDE: NT-Pro BNP: 1009 pg/mL — ABNORMAL HIGH (ref 0–738)

## 2023-01-13 LAB — URIC ACID: Uric Acid: 7.9 mg/dL (ref 3.1–7.9)

## 2023-01-13 MED ORDER — FLUTICASONE PROPIONATE 50 MCG/ACT NA SUSP: 2.0000 | Freq: Every day | NASAL | 2 refills | Status: AC

## 2023-01-13 NOTE — Telephone Encounter (Signed)
New Message:       Patient said she saw Dawn Wood yesterday. She was supposed to have given her the name of the blood pressure cuff she could get to monitor her blood pressure. Please call her withh the name of it .

## 2023-01-13 NOTE — Progress Notes (Signed)
I,Jameka J Llittleton, CMA,acting as a Neurosurgeon for [employer], NP.,have documented all relevant documentation on the behalf of Ellender Hose, NP,as directed by  Ellender Hose, NP while in the presence of Ellender Hose, NP.  Subjective:  Patient ID: Dawn Wood , female    DOB: [birth year] , 77 y.o.   MRN: [id number]  Chief Complaint  Patient presents with   Edema    HPI  Patient is a 77 year old female who presents for chronic disease management. Patient was at the Cardiologist's office yesterday and her BP meds was changed to include Amlodipine 5 mg every day and she was put on a regimen of Furosemide 20 mg BID for 7 day then 20 mg daily. Uric acid / BNP levels were checked. Patient reports the swelling is a little bit. Patient reports having SOB and her cardiologist recommended her seeing a pulmonologist. Patient also states that she will be picking up her mask for CPAP in the next few days, she has not been using her CPAP because of discomfort. Patient advised to used her CPAP as often as possible d/t diagnosis of obstructive sleep apnea.        Past Medical History:  Diagnosis Date   Coronary artery disease    Diabetes mellitus without complication (HCC)    GERD (gastroesophageal reflux disease)    Hypertension    MI (myocardial infarction) (HCC)    Peripheral vascular disease (HCC)      Family History  Problem Relation Age of Onset   Hypertension Mother    Cancer Father    Hypertension Father    Breast cancer Sister 57   Aortic stenosis Daughter    Heart disease Daughter        before age 10   Thyroid disease Daughter    Sleep apnea Neg Hx      Current Outpatient Medications:    Accu-Chek Softclix Lancets lancets, USE TO CHECK BLOOD SUGARS ONCE DAILY., Disp: 100 each, Rfl: 12   amLODipine (NORVASC) 5 MG tablet, Take 5 mg by mouth daily., Disp: , Rfl:    apixaban (ELIQUIS) 5 MG TABS tablet, Take 1 tablet (5 mg total) by mouth 2 (two) times daily., Disp: 180 tablet,  Rfl: 1   aspirin 81 MG chewable tablet, Chew 1 tablet (81 mg total) by mouth daily., Disp: 30 tablet, Rfl: 0   BD PEN NEEDLE NANO 2ND GEN 32G X 4 MM MISC, USE AS DIRECTED TWICE DAILY, Disp: 100 each, Rfl: 2   Blood Glucose Monitoring Suppl (ACCU-CHEK GUIDE) w/Device KIT, USE TO CHECK BLOOD SUGARS ONCE DAILY., Disp: 1 kit, Rfl: 0   Cholecalciferol (VITAMIN D3) 2000 units TABS, Take 1 tablet by mouth daily., Disp: , Rfl:    clopidogrel (PLAVIX) 75 MG tablet, Take 1 tablet (75 mg total) by mouth daily with breakfast., Disp: 90 tablet, Rfl: 2   colchicine 0.6 MG tablet, Take 0.6 mg by mouth as needed (for gout)., Disp: , Rfl:    dorzolamide-timolol (COSOPT) 2-0.5 % ophthalmic solution, Place 1 drop into the right eye 2 (two) times daily., Disp: , Rfl:    Evolocumab (REPATHA SURECLICK) 140 MG/ML SOAJ, Inject 140 mg into the skin every 14 (fourteen) days., Disp: 6 mL, Rfl: 1   furosemide (LASIX) 20 MG tablet, Take 2 tablets by mouth daily for 7 days then reduce back to 1 tablet by mouth daily, Disp: , Rfl:    glucose blood (ACCU-CHEK GUIDE) test strip, USE TO CHECK BLOOD SUGARS ONCE DAILY.,  Disp: 100 each, Rfl: 12   insulin aspart protamine - aspart (NOVOLOG MIX 70/30 FLEXPEN) (70-30) 100 UNIT/ML FlexPen, INJECT 40 UNITS SUBCUTANEOUS EVERY MORNING AND 50 UNITS SUBCUTANEOUS AT DINNER, Disp: 75 mL, Rfl: 2   isosorbide mononitrate (IMDUR) 60 MG 24 hr tablet, Take 1 tablet (60 mg total) by mouth daily. Pt takes 1 tablet by mouth daily., Disp: 30 tablet, Rfl: 7   ketorolac (ACULAR) 0.5 % ophthalmic solution, Place 1 drop into the right eye 3 (three) times daily., Disp: , Rfl:    metoprolol succinate (TOPROL-XL) 100 MG 24 hr tablet, Take 1.5 tablets (150 mg total) by mouth daily. Take with or immediately following a meal., Disp: 135 tablet, Rfl: 3   nitroGLYCERIN (NITROSTAT) 0.4 MG SL tablet, Place 1 tablet (0.4 mg total) under the tongue every 5 (five) minutes as needed for chest pain., Disp: 25 tablet, Rfl:  11   olmesartan (BENICAR) 40 MG tablet, TAKE 1 TABLET(40 MG) BY MOUTH DAILY, Disp: 90 tablet, Rfl: 2   ONETOUCH VERIO test strip, USE TO CHECK BLOOD SUGARS TWICE DAILY AS DIRECTED, Disp: 100 strip, Rfl: 3   pantoprazole (PROTONIX) 40 MG tablet, Take 1 tablet (40 mg total) by mouth daily., Disp: 90 tablet, Rfl: 1   potassium chloride (KLOR-CON) 10 MEQ tablet, Take 2 tablets by mouth daily for 7 days then reduce back to 1 tablet by mouth daily, Disp: , Rfl:    Semaglutide,0.25 or 0.5MG /DOS, (OZEMPIC, 0.25 OR 0.5 MG/DOSE,) 2 MG/1.5ML SOPN, INJECT 0.5MG  INTO SKIN ONCE A WEEK, Disp: 4.5 mL, Rfl: 3   sodium chloride (MURO 128) 5 % ophthalmic solution, Place 1 drop into the right eye 2 (two) times daily., Disp: , Rfl:    spironolactone (ALDACTONE) 25 MG tablet, Take 1 tablet (25 mg total) by mouth daily., Disp: 90 tablet, Rfl: 2   traMADol (ULTRAM) 50 MG tablet, Take 50 mg by mouth daily at 6 (six) AM., Disp: , Rfl:    VYZULTA 0.024 % SOLN, Apply 1 drop to eye in the morning, at noon, and at bedtime., Disp: , Rfl:    fluticasone (FLONASE) 50 MCG/ACT nasal spray, Place 2 sprays into both nostrils daily., Disp: 16 g, Rfl: 2   Allergies  Allergen Reactions   Dust Mite Extract Other (See Comments)    REACTION: SNEEZING   Other Other (See Comments)    ALLERGEN: DEODORIZERS SPRAY AND PERFUMES REACTION: SNEEZING   Statins Other (See Comments)    Muscle aches with rosuvastatin 5 mg daily and 3x/weekly, atorvastatin 20 mg daily and 3x/week, simvastatin 40 mg daily    Allopurinol     Diarrhea and the pt said it made her sugars low   Pollen Extract Other (See Comments)    Reaction unknown     Review of Systems  Constitutional: Negative.   Respiratory:  Positive for shortness of breath.        Occasional shortness of breath  Cardiovascular:  Positive for leg swelling.  Gastrointestinal: Negative.   Genitourinary: Negative.   Musculoskeletal:  Positive for joint swelling.       UE swelling      Today's Vitals   01/13/23 1122 01/13/23 1215 01/13/23 1228  BP: (!) 160/90 (!) 180/80 (!) 182/80  Pulse: 69    Temp: 98.1 F (36.7 C)    Weight: 217 lb (98.4 kg)    Height: 5\' 5"  (1.651 m)    PainLoc: Ankle     Body mass index is 36.11 kg/m.  Wt Readings  from Last 3 Encounters:  01/13/23 217 lb (98.4 kg)  01/12/23 222 lb 6.4 oz (100.9 kg)  12/14/22 220 lb 3.2 oz (99.9 kg)    The ASCVD Risk score (Arnett DK, et al., 2019) failed to calculate for the following reasons:   The patient has a prior MI or stroke diagnosis  Objective:  Physical Exam HENT:     Head: Normocephalic.  Cardiovascular:     Rate and Rhythm: Normal rate.  Pulmonary:     Breath sounds: No wheezing, rhonchi or rales.  Abdominal:     General: Bowel sounds are normal.  Musculoskeletal:     Right lower leg: Pitting Edema present.     Left lower leg: Pitting Edema present.  Neurological:     Mental Status: She is alert and oriented to person, place, and time.         Assessment And Plan:  Hypertensive heart and renal disease with renal failure, stage 1 through stage 4 or unspecified chronic kidney disease, without heart failure Assessment & Plan: Cardiology added Amlodipine 5 mg to treatment regimen yesterday. Patient advised to monitor her BP and update the office.   Thrombophilia (HCC) Assessment & Plan: On Eliquis 5mg  BID d/t PAF   Type 2 diabetes mellitus with stage 3a chronic kidney disease, with long-term current use of insulin (HCC) Assessment & Plan: Encouraged to keep BS well controlled and avoid use of NSAIDs   Orders: -     Ambulatory referral to Nephrology  Class 2 severe obesity due to excess calories with serious comorbidity and body mass index (BMI) of 36.0 to 36.9 in adult [facility]) Assessment & Plan: She is encouraged to strive for BMI less than 30 to decrease cardiac risk. Advised to aim for at least 150 minutes of exercise per week or as tolerated.   Shortness of breath -      Ambulatory referral to Pulmonology  Medication management -     Fluticasone Propionate; Place 2 sprays into both nostrils daily.  Dispense: 16 g; Refill: 2  Lower extremity edema Assessment & Plan: Cardiology started patient on Furosemide 20 mg BID for 7days and every day for 7 days.     Return if symptoms worsen or fail to improve.  Patient was given opportunity to ask questions. Patient verbalized understanding of the plan and was able to repeat key elements of the plan. All questions were answered to their satisfaction.    I, Ellender Hose, NP, have reviewed all documentation for this visit. The documentation on 01/17/2023 for the exam, diagnosis, procedures, and orders are all accurate and complete.    IF YOU HAVE BEEN REFERRED TO A SPECIALIST, IT MAY TAKE 1-2 WEEKS TO SCHEDULE/PROCESS THE REFERRAL. IF YOU HAVE NOT HEARD FROM US/SPECIALIST IN TWO WEEKS, PLEASE GIVE [place] A CALL AT [phone].

## 2023-01-13 NOTE — Telephone Encounter (Signed)
Spoke with pt and advised Omron is generally reliable blood pressure cuff and pt should be able to purchase at her pharmacy.  Pt verbalized understanding and thanked RN for the call back.

## 2023-01-13 NOTE — Telephone Encounter (Signed)
Patient is following up. She states she doesn't think Omron was the brand [place], PA told her about because PA advised that it would only be about $25, but she called 2 pharmacies and the price was significantly higher. One pharmacy said it would be $90 and the other said $60.

## 2023-01-13 NOTE — Telephone Encounter (Signed)
Will forward this message to Jari Favre PA-C to further advise on what BP monitoring device/brand she advised for the pt to purchase, at yesterday's OV.   Triage nursing will follow-up with the pt accordingly thereafter, once Tessa advises.

## 2023-01-14 NOTE — Telephone Encounter (Signed)
Called patient back with Dawn Favre PA's recommendations. Patient verbalized understanding.

## 2023-01-14 NOTE — Progress Notes (Signed)
Office Visit Note  Patient: Dawn Wood             Date of Birth: November 19, 1945           MRN: [id number]             PCP: Dorothyann Peng, MD Referring: Dorothyann Peng, MD Visit Date: 01/28/2023 Occupation: @GUAROCC @  Subjective:  Pain in both feet  History of Present Illness: Dawn Wood is a 77 y.o. female seen in consultation per request of Dr. Allyne Gee.  Patient was initially evaluated by me in September 2020.  At the time she presented with left foot swelling.  She was started on colchicine by her cardiologist.  Her initial uric acid in April 2020 was 8.4. Patient was last seen in the office for gout and osteoarthritis in October 2022.  At the time her uric acid was 8.3 and HLA-B 5801 was negative.  At the last visit on December 13, 2018 she was given a prednisone taper for a flare.  She was also started on allopurinol.  Patient states she stopped allopurinol because of some side effects but she does not recall.  She states since then she has had about 3 episodes of gout every year for which she takes colchicine.  Patient states that she was hospitalized in October for MI at the time she also had a flare of gout.  Her uric acid was elevated.  She was advised to come and see me.  She states she was treated with colchicine which she has been taking on as needed basis.  She is currently not taking colchicine.  She complains of discomfort in her lower back, her knees, ankles and feet.  She also has occasional discomfort in the left hip.  She denies discomfort in any other joints.  She states she does have a lot of fluid retention and she has been taking Lasix. In May 2024 she was seen by Dr. Allyne Gee at the time she presented with pain and swelling in her hands.  I reviewed the records and Dr. Allyne Gee noted swelling in her right hand.  She also obtain labs at the time which showed positive ANA.  Rheumatoid factor and anti-CCP were negative and sed rate was normal.  Patient gives  history of dry mouth.  There is no history of oral ulcers, nasal ulcers, malar rash, photosensitivity, hair loss or lymphadenopathy.  There is no family history of autoimmune disease.  She is gravida 3, para 2, 1 tubal pregnancy.  There is no history of DVTs or preeclampsia.  Activities of Daily Living:  Patient reports morning stiffness for 24 hours.   Patient Reports nocturnal pain.  Difficulty dressing/grooming: Reports Difficulty climbing stairs: Reports Difficulty getting out of chair: Reports Difficulty using hands for taps, buttons, cutlery, and/or writing: Denies  Review of Systems  Constitutional:  Positive for fatigue.  HENT:  Positive for mouth dryness. Negative for mouth sores.   Eyes:  Negative for dryness.  Respiratory:  Positive for shortness of breath.   Cardiovascular:  Positive for chest pain. Negative for palpitations.  Gastrointestinal:  Positive for constipation. Negative for blood in stool and diarrhea.  Endocrine: Negative for increased urination.  Genitourinary:  Positive for involuntary urination.  Musculoskeletal:  Positive for joint pain, joint pain, joint swelling, myalgias, muscle weakness, morning stiffness, muscle tenderness and myalgias. Negative for gait problem.  Skin:  Negative for color change, rash, hair loss and sensitivity to sunlight.  Allergic/Immunologic: Negative for susceptible to infections.  Neurological:  Positive for dizziness and headaches.  Hematological:  Negative for swollen glands.  Psychiatric/Behavioral:  Positive for depressed mood and sleep disturbance. The patient is not nervous/anxious.     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) 12/20/2022   Thrombophilia (HCC)  12/20/2022   Paroxysmal atrial fibrillation (HCC) 12/03/2022   STEMI involving left anterior descending coronary artery (HCC) 11/30/2022   Shortness of breath 10/30/2022   Class 2 severe obesity due to excess calories with serious comorbidity and body mass index (BMI) of 35.0 to 35.9 in adult [facility]) 10/30/2022   Thyroid enlargement 10/21/2022   Sleep apnea 08/26/2022   Statin myopathy 02/05/2020   Type 2 diabetes mellitus with stage 3a chronic kidney disease, with long-term current use of insulin (HCC) 10/04/2019   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   Class 1 obesity due to excess calories with serious comorbidity and body mass index (BMI) of 34.0 to 34.9 in adult 10/04/2019   Gout 12/26/2017   Dyslipidemia 06/19/2014   PVD (peripheral vascular disease) (HCC) 12/13/2013   CAD in native artery 07/02/2013   Essential hypertension 07/02/2013   Diabetes mellitus type II, uncontrolled 07/02/2013   GERD (gastroesophageal reflux disease) 07/02/2013    Past Medical History:  Diagnosis Date   Coronary artery disease    Diabetes mellitus without complication (HCC)    GERD (gastroesophageal reflux disease)    Heart attack (HCC) 12/2022   Hypertension    MI (myocardial infarction) (HCC)    Peripheral vascular disease (HCC)    Sleep apnea     Family History  Problem Relation Age of Onset   Hypertension Mother    Hypertension Father    Throat cancer Father    Breast cancer Sister 19   Aortic stenosis Daughter    Heart disease Daughter        before age 10   Thyroid disease Daughter    Sleep apnea Neg Hx    Past Surgical History:  Procedure Laterality Date   ABDOMINAL HYSTERECTOMY  1985   APPENDECTOMY     CATARACT EXTRACTION Left 02/20/2018   Dr. Harlon Flor   CATARACT EXTRACTION Bilateral 2024   cataract surgery Right 01/25/2022   Second at [facility] 11/29   CORONARY STENT INTERVENTION N/A 11/30/2022   Procedure: CORONARY STENT INTERVENTION;  Surgeon: Swaziland, Peter M, MD;   Location: [facility] INVASIVE CV LAB;  Service: Cardiovascular;  Laterality: N/A;  LAD, RCA   CORONARY STENT PLACEMENT  2015   CORONARY ULTRASOUND/IVUS N/A 11/30/2022   Procedure: Coronary Ultrasound/IVUS;  Surgeon: Swaziland, Peter M, MD;  Location: [facility] INVASIVE CV LAB;  Service: Cardiovascular;  Laterality: N/A;  LAD, RCA   ectopic pregnancy--unilateral salpingectomy  1980   KNEE ARTHROSCOPY Left 03/2016   LEFT HEART CATH AND CORONARY ANGIOGRAPHY N/A 11/30/2022   Procedure: LEFT HEART CATH AND CORONARY ANGIOGRAPHY;  Surgeon: Swaziland, Peter M, MD;  Location: [facility] INVASIVE CV LAB;  Service: Cardiovascular;  Laterality: N/A;   unilateral oophorectomy     Social History   Social History Narrative   Not on file   Immunization History  Administered Date(s) Administered   Fluad Quad(high Dose 65+) 04/06/2021, 12/30/2021   [employer] ([employer]) SARS-COV-2 Vaccination 05/25/2019   PFIZER(Purple Top)SARS-COV-2  Vaccination 02/21/2020   Tdap 11/04/2017     Objective: Vital Signs: BP (!) 146/76 (BP Location: Left Arm, Patient Position: Sitting, Cuff Size: Normal)   Pulse (!) 53   Resp 17   Ht 5' 4.25" (1.632 m)   Wt 219 lb (99.3 kg)   BMI 37.30 kg/m    Physical Exam Vitals and nursing note reviewed.  Constitutional:      Appearance: She is well-developed.  HENT:     Head: Normocephalic and atraumatic.  Eyes:     Conjunctiva/sclera: Conjunctivae normal.  Cardiovascular:     Rate and Rhythm: Normal rate and regular rhythm.     Heart sounds: Normal heart sounds.  Pulmonary:     Effort: Pulmonary effort is normal.     Breath sounds: Normal breath sounds.  Abdominal:     General: Bowel sounds are normal.     Palpations: Abdomen is soft.  Musculoskeletal:     Cervical back: Normal range of motion.     Right lower leg: Edema present.     Left lower leg: Edema present.  Lymphadenopathy:     Cervical: No cervical adenopathy.  Skin:    General: Skin is warm and dry.     Capillary Refill: Capillary refill  takes less than 2 seconds.  Neurological:     Mental Status: She is alert and oriented to person, place, and time.  Psychiatric:        Behavior: Behavior normal.      Musculoskeletal Exam: Cervical, thoracic and lumbar spine 1 good range of motion.  She had discomfort range of motion of her lumbar spine.  Patient was examined in the seated position but she feels uncomfortable laying flat on the exam table.  Shoulder joints, elbow joints, wrist joints, MCPs and PIPs were in good range of motion with no synovitis.  Patient has puffiness on her right hand due to edema.  Hip joints could not be assessed.  Knee joints were in good range of motion without any warmth swelling or effusion.  She had pitting edema abdominal her shins.  No tenderness was noted over ankles or MTPs.  CDAI Exam: CDAI Score: -- Patient Global: --; Provider Global: -- Swollen: --; Tender: -- Joint Exam 01/28/2023   No joint exam has been documented for this visit   There is currently no information documented on the homunculus. Go to the Rheumatology activity and complete the homunculus joint exam.  Investigation: No additional findings.  Imaging: XR Foot 2 Views Left  Result Date: 01/28/2023 Severe first MTP narrowing with erosive changes were noted.  PIP and DIP narrowing was noted.  Cystic versus erosive changes were noted at the base of the metatarsals.  Intertarsal narrowing with dorsal spurring was noted.  No tibiotalar  joint space narrowing was noted.  Small inferior calcaneal spur was noted. Impression: These findings with history of osteoarthritis and gouty arthropathy overlap.  XR Foot 2 Views Right  Result Date: 01/28/2023 Severe first MTP narrowing with erosive changes were noted.  PIP and DIP narrowing was noted.  Intertarsal narrowing with dorsal spurring was noted.  No tibiotalar  joint space narrowing was noted.  Small inferior calcaneal spur was noted. Impression: These findings with history of  osteoarthritis and gouty arthropathy overlap.  XR KNEE 3 VIEW LEFT  Result Date: 01/28/2023 Moderate medial compartment narrowing was noted.  Mild patellofemoral narrowing was noted.  No chondrocalcinosis was noted. Impression: These findings suggestive of moderate osteoarthritis and mild chondromalacia patella.  XR  KNEE 3 VIEW RIGHT  Result Date: 01/28/2023 Moderate medial and lateral compartment narrowing was noted.  Intercondylar osteophytes were noted.  Mild patellofemoral narrowing was noted. Impression: These findings suggestive of moderate osteoarthritis and mild chondromalacia patella.  XR Hand 2 View Left  Result Date: 01/28/2023 Severe CMC narrowing and subluxation was noted.  PIP and DIP narrowing was noted.  Second and third MCP narrowing was noted.  No intercarpal or radiocarpal joint space narrowing was noted.  No erosive changes were noted. Impression: These findings are consistent with osteoarthritis.  Narrowing of the second and third MCP joints can be seen in inflammatory arthritis.  XR Hand 2 View Right  Result Date: 01/28/2023 Severe CMC PIP and DIP narrowing was noted.  Second and third MCP joint narrowing was noted.  No intercarpal radiocarpal joint space narrowing was noted.  No erosive changes were noted. Impression: These findings are suggestive of osteoarthritis of the hand.  Second and third MCP narrowing could be seen in inflammatory arthritis.   Recent Labs: Lab Results  Component Value Date   WBC 9.3 12/04/2022   HGB 13.1 12/04/2022   PLT 198 12/04/2022   NA 145 (H) 01/19/2023   K 4.5 01/19/2023   CL 104 01/19/2023   CO2 21 01/19/2023   GLUCOSE 63 (L) 01/19/2023   BUN 19 01/19/2023   CREATININE 1.02 (H) 01/19/2023   BILITOT 1.2 11/30/2022   ALKPHOS 64 11/30/2022   AST 16 11/30/2022   ALT 15 11/30/2022   PROT 7.4 11/30/2022   ALBUMIN 3.6 11/30/2022   CALCIUM 9.5 01/19/2023   GFRAA 53 (L) 02/11/2020    December 13, 2018 ESR 28, RF negative,  anti-CCP negative, uric acid 8.3, HLA B 58: 01 negative   Speciality Comments: No specialty comments available.  Procedures:  No procedures performed Allergies: Dust mite extract, Other, Statins, Allopurinol, and Pollen extract   Assessment / Plan:     Visit Diagnoses: Positive ANA (antinuclear antibody) - 08/05/22: ANA 1:1280 nuclear dot pattern, Anti-CCP 7, RF<10, ESR 48 -patient was found to have positive ANA in May 2024.  She denies any history of oral ulcers, nasal ulcers, malar rash, photosensitivity, Raynaud's phenomenon, lymphadenopathy.  No synovitis was noted on the examination today.  No nailbed capillary changes were noted.  I will obtain additional labs today.  Plan: Sedimentation rate, ANA, Anti-scleroderma antibody, RNP Antibody, Anti-Smith antibody, Sjogrens syndrome-A extractable nuclear antibody, Sjogrens syndrome-B extractable nuclear antibody, Anti-DNA antibody, double-stranded, C3 and C4, Beta-2 glycoprotein antibodies, Cardiolipin antibodies, IgG, IgM, IgA, Protein / creatinine ratio, urine  Idiopathic chronic gout of multiple sites without tophus - 08/05/22: uric acid 9.4.  uric acid 7.9 on 01/12/23.  Patient was diagnosed with gout in 2020.  At the time she was given a prescription for allopurinol.  Patient states she experienced some side effects from allopurinol and did not want to take it.  I did detailed discussion regarding initiating low-dose allopurinol but patient declined.  She takes colchicine only on as needed basis.  She states she gets about 3 episodes of gout a year which responded to colchicine.  She does not want to take colchicine on a daily basis.  Pain in both hands -she recently has not experienced any discomfort in her hands.  Although she had problems with hand pain in the past.  Plan: XR Hand 2 View Right, XR Hand 2 View Left.  X-rays of the bilateral hands were suggestive of osteoarthritis.  Bilateral second and third MCP joint narrowing was noted.  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  Chronic pain of both knees -she complains of chronic discomfort in her knee joints.  No warmth swelling or effusion was noted.  Plan: XR KNEE 3 VIEW RIGHT, XR KNEE 3 VIEW LEFT.  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  No chondrocalcinosis noted.  Pain in both feet -she complains of discomfort in her ankles and her feet.  No synovitis was noted.  She has significant plan: XR Foot 2 Views Right, XR Foot 2 Views Left.  Severe osteoarthritis was noted in bilateral feet with erosive changes in the first MTP joint at the base of the MTPs suggestive of gouty arthropathy.   Other medical problems are listed as follows:  CAD in native artery  Essential hypertension-blood pressure was elevated at 146/76.  She was advised to monitor blood pressure closely and follow-up with her PCP.  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)  Dyslipidemia  Statin myopathy  Type 2 diabetes mellitus with stage 3a chronic kidney disease, with long-term current use of insulin (HCC)  Gastroesophageal reflux disease without esophagitis  Thrombophilia (HCC)  Thyroid enlargement  Obstructive sleep apnea syndrome  Orders: Orders Placed This Encounter  Procedures   XR KNEE 3 VIEW RIGHT   XR KNEE 3 VIEW LEFT   XR Foot 2 Views Right   XR Foot 2 Views Left   XR Hand 2 View Right   XR Hand 2 View Left   Sedimentation rate   ANA   Anti-scleroderma antibody   RNP Antibody   Anti-Smith antibody   Sjogrens syndrome-A extractable nuclear antibody   Sjogrens syndrome-B extractable nuclear  antibody   Anti-DNA antibody, double-stranded   C3 and C4   Beta-2 glycoprotein antibodies   Cardiolipin antibodies, IgG, IgM, IgA   Protein / creatinine ratio, urine   No orders of the defined types were placed in this encounter.     Follow-Up Instructions: Return for Positive ANA, gout, osteoarthritis.   Pollyann Savoy, MD  Note - This record has been created using Animal nutritionist.  Chart creation errors have been sought, but may not always  have been located. Such creation errors do not reflect on  the standard of medical care.

## 2023-01-17 NOTE — Addendum Note (Signed)
Addended by: Burnetta Sabin on: 01/17/2023 02:51 PM   Modules accepted: Orders

## 2023-01-18 DIAGNOSIS — G4733 Obstructive sleep apnea (adult) (pediatric): Secondary | ICD-10-CM | POA: Diagnosis not present

## 2023-01-19 DIAGNOSIS — Z794 Long term (current) use of insulin: Secondary | ICD-10-CM | POA: Diagnosis not present

## 2023-01-19 DIAGNOSIS — I10 Essential (primary) hypertension: Secondary | ICD-10-CM | POA: Diagnosis not present

## 2023-01-19 DIAGNOSIS — R600 Localized edema: Secondary | ICD-10-CM | POA: Insufficient documentation

## 2023-01-19 DIAGNOSIS — E66812 Obesity, class 2: Secondary | ICD-10-CM | POA: Insufficient documentation

## 2023-01-19 DIAGNOSIS — Z79899 Other long term (current) drug therapy: Secondary | ICD-10-CM | POA: Diagnosis not present

## 2023-01-19 DIAGNOSIS — I2510 Atherosclerotic heart disease of native coronary artery without angina pectoris: Secondary | ICD-10-CM | POA: Diagnosis not present

## 2023-01-19 DIAGNOSIS — R079 Chest pain, unspecified: Secondary | ICD-10-CM | POA: Diagnosis not present

## 2023-01-19 DIAGNOSIS — N1831 Chronic kidney disease, stage 3a: Secondary | ICD-10-CM | POA: Diagnosis not present

## 2023-01-19 DIAGNOSIS — E1122 Type 2 diabetes mellitus with diabetic chronic kidney disease: Secondary | ICD-10-CM | POA: Diagnosis not present

## 2023-01-19 DIAGNOSIS — I480 Paroxysmal atrial fibrillation: Secondary | ICD-10-CM | POA: Diagnosis not present

## 2023-01-19 DIAGNOSIS — E785 Hyperlipidemia, unspecified: Secondary | ICD-10-CM | POA: Diagnosis not present

## 2023-01-19 NOTE — Assessment & Plan Note (Signed)
Cardiology started patient on Furosemide 20 mg BID for 7days and every day for 7 days.

## 2023-01-19 NOTE — Assessment & Plan Note (Signed)
Cardiology added Amlodipine 5 mg to treatment regimen yesterday. Patient advised to monitor her BP and update the office.

## 2023-01-19 NOTE — Assessment & Plan Note (Signed)
Encouraged to keep BS well controlled and avoid use of NSAIDs

## 2023-01-19 NOTE — Assessment & Plan Note (Addendum)
She is encouraged to strive for BMI less than 30 to decrease cardiac risk. Advised to aim for at least 150 minutes of exercise per week or as tolerated.

## 2023-01-19 NOTE — Assessment & Plan Note (Signed)
On Eliquis 5mg  BID d/t PAF

## 2023-01-20 ENCOUNTER — Ambulatory Visit: Payer: Medicare PPO | Admitting: Internal Medicine

## 2023-01-20 LAB — BASIC METABOLIC PANEL
BUN/Creatinine Ratio: 19 (ref 12–28)
BUN: 19 mg/dL (ref 8–27)
CO2: 21 mmol/L (ref 20–29)
Calcium: 9.5 mg/dL (ref 8.7–10.3)
Chloride: 104 mmol/L (ref 96–106)
Creatinine, Ser: 1.02 mg/dL — ABNORMAL HIGH (ref 0.57–1.00)
Glucose: 63 mg/dL — ABNORMAL LOW (ref 70–99)
Potassium: 4.5 mmol/L (ref 3.5–5.2)
Sodium: 145 mmol/L — ABNORMAL HIGH (ref 134–144)
eGFR: 56 mL/min/{1.73_m2} — ABNORMAL LOW (ref >59)

## 2023-01-20 LAB — PRO B NATRIURETIC PEPTIDE: NT-Pro BNP: 716 pg/mL (ref 0–738)

## 2023-01-21 ENCOUNTER — Ambulatory Visit: Payer: Medicare PPO | Admitting: Nurse Practitioner

## 2023-01-24 ENCOUNTER — Telehealth: Payer: Self-pay

## 2023-01-24 NOTE — Telephone Encounter (Signed)
Called and spoke to pt and scheduled 03/29/22 at 10:15am

## 2023-01-24 NOTE — Telephone Encounter (Signed)
Called and spoke to pt who stated that she just got cpap machine but hasn't started using it yet. I cancelled tomorrows appt and will ask sarah when she can fit her back in in 2 months to followup on compliance

## 2023-01-25 ENCOUNTER — Ambulatory Visit: Payer: Medicare PPO | Admitting: Neurology

## 2023-01-25 ENCOUNTER — Other Ambulatory Visit (HOSPITAL_COMMUNITY): Payer: Medicare PPO

## 2023-01-28 ENCOUNTER — Ambulatory Visit: Payer: Medicare PPO

## 2023-01-28 ENCOUNTER — Ambulatory Visit (INDEPENDENT_AMBULATORY_CARE_PROVIDER_SITE_OTHER): Payer: Medicare PPO

## 2023-01-28 ENCOUNTER — Encounter: Payer: Self-pay | Admitting: Rheumatology

## 2023-01-28 ENCOUNTER — Ambulatory Visit: Payer: Medicare PPO | Attending: Rheumatology | Admitting: Rheumatology

## 2023-01-28 VITALS — BP 146/76 | HR 53 | Resp 17 | Ht 64.25 in | Wt 219.0 lb

## 2023-01-28 DIAGNOSIS — M79672 Pain in left foot: Secondary | ICD-10-CM

## 2023-01-28 DIAGNOSIS — M25561 Pain in right knee: Secondary | ICD-10-CM

## 2023-01-28 DIAGNOSIS — M79642 Pain in left hand: Secondary | ICD-10-CM

## 2023-01-28 DIAGNOSIS — I480 Paroxysmal atrial fibrillation: Secondary | ICD-10-CM | POA: Diagnosis not present

## 2023-01-28 DIAGNOSIS — R7689 Other specified abnormal immunological findings in serum: Secondary | ICD-10-CM

## 2023-01-28 DIAGNOSIS — K219 Gastro-esophageal reflux disease without esophagitis: Secondary | ICD-10-CM

## 2023-01-28 DIAGNOSIS — R768 Other specified abnormal immunological findings in serum: Secondary | ICD-10-CM

## 2023-01-28 DIAGNOSIS — M25562 Pain in left knee: Secondary | ICD-10-CM

## 2023-01-28 DIAGNOSIS — M79671 Pain in right foot: Secondary | ICD-10-CM

## 2023-01-28 DIAGNOSIS — G8929 Other chronic pain: Secondary | ICD-10-CM | POA: Diagnosis not present

## 2023-01-28 DIAGNOSIS — D6859 Other primary thrombophilia: Secondary | ICD-10-CM

## 2023-01-28 DIAGNOSIS — I10 Essential (primary) hypertension: Secondary | ICD-10-CM | POA: Diagnosis not present

## 2023-01-28 DIAGNOSIS — E049 Nontoxic goiter, unspecified: Secondary | ICD-10-CM

## 2023-01-28 DIAGNOSIS — M79641 Pain in right hand: Secondary | ICD-10-CM

## 2023-01-28 DIAGNOSIS — I2510 Atherosclerotic heart disease of native coronary artery without angina pectoris: Secondary | ICD-10-CM

## 2023-01-28 DIAGNOSIS — T466X5A Adverse effect of antihyperlipidemic and antiarteriosclerotic drugs, initial encounter: Secondary | ICD-10-CM

## 2023-01-28 DIAGNOSIS — Z794 Long term (current) use of insulin: Secondary | ICD-10-CM

## 2023-01-28 DIAGNOSIS — I1310 Hypertensive heart and chronic kidney disease without heart failure, with stage 1 through stage 4 chronic kidney disease, or unspecified chronic kidney disease: Secondary | ICD-10-CM | POA: Diagnosis not present

## 2023-01-28 DIAGNOSIS — N1831 Chronic kidney disease, stage 3a: Secondary | ICD-10-CM

## 2023-01-28 DIAGNOSIS — I739 Peripheral vascular disease, unspecified: Secondary | ICD-10-CM

## 2023-01-28 DIAGNOSIS — M1A09X0 Idiopathic chronic gout, multiple sites, without tophus (tophi): Secondary | ICD-10-CM

## 2023-01-28 DIAGNOSIS — G4733 Obstructive sleep apnea (adult) (pediatric): Secondary | ICD-10-CM

## 2023-01-28 DIAGNOSIS — G720 Drug-induced myopathy: Secondary | ICD-10-CM

## 2023-01-28 DIAGNOSIS — E785 Hyperlipidemia, unspecified: Secondary | ICD-10-CM

## 2023-01-28 DIAGNOSIS — I2102 ST elevation (STEMI) myocardial infarction involving left anterior descending coronary artery: Secondary | ICD-10-CM

## 2023-01-28 DIAGNOSIS — I2109 ST elevation (STEMI) myocardial infarction involving other coronary artery of anterior wall: Secondary | ICD-10-CM

## 2023-01-28 DIAGNOSIS — E1122 Type 2 diabetes mellitus with diabetic chronic kidney disease: Secondary | ICD-10-CM

## 2023-01-28 NOTE — Patient Instructions (Signed)
Information for patients with Gout  Gout defined-Gout occurs when urate crystals accumulate in your joint causing the inflammation and intense pain of gout attack.  Urate crystals can form when you have high levels of uric acid in your blood.  Your body produces uric acid when it breaks down prurines-substances that are found naturally in your body, as well as in certain foods such as organ meats, anchioves, herring, asparagus, and mushrooms.  Normally uric acid dissolves in your blood and passes through your kidneys into your urine.  But sometimes your body either produces too much uric acid or your kidneys excrete too little uric acid.  When this happens, uric acid can build up, forming sharp needle-like urate crystals in a joint or surrounding tissue that cause pain, inflammation and swelling.    Gout is characterized by sudden, severe attacks of pain, redness and tenderness in joints, often the joint at the base of the big toe.  Gout is complex form of arthritis that can affect anyone.  Men are more likely to get gout but women become increasingly more susceptible to gout after menopause.  An acute attack of gout can wake you up in the middle of the night with the sensation that your big toe is on fire.  The affected joint is hot, swollen and so tender that even the weight or the sheet on it may seem intolerable.  If you experience symptoms of an acute gout attack it is important to your doctor as soon as the symptoms start.  Gout that goes untreated can lead to worsening pain and joint damage.  Risk Factors:  You are more likely to develop gout if you have high levels of uric acid in your body.    Factors that increase the uric acid level in your body include:  Lifestyle factors.  Excessive alcohol use-generally more than two drinks a day for men and more than one for women increase the risk of gout.  Medical conditions.  Certain conditions make it more likely that you will develop gout.   These include hypertension, and chronic conditions such as diabetes, high levels of fat and cholesterol in the blood, and narrowing of the arteries.  Certain medications.  The uses of Thiazide diuretics- commonly used to treat hypertension and low dose aspirin can also increase uric acid levels.  Family history of gout.  If other members of your family have had gout, you are more likely to develop the disease.  Age and sex. Gout occurs more often in men than it does in women, primarily because women tend to have lower uric acid levels than men do.  Men are more likely to develop gout earlier usually between the ages of 15-50- whereas women generally develop signs and symptoms after menopause.    Tests and diagnosis:  Tests to help diagnose gout may include:  Blood test.  Your doctor may recommend a blood test to measure the uric acid level in your blood .  Blood tests can be misleading, though.  Some people have high uric acid levels but never experience gout.  And some people have signs and symptoms of gout, but don't have unusual levels of uric acid in their blood.  Joint fluid test.  Your doctor may use a needle to draw fluid from your affected joint.  When examined under the microscope, your joint fluid may reveal urate crystals.  Treatment:  Treatment for gout usually involves medications.  What medications you and your doctor choose will be  based on your current health and other medications you currently take.  Gout medications can be used to treat acute gout attacks and prevent future attacks as well as reduce your risk of complications from gout such as the development of tophi from urate crystal deposits.  Alternative medicine:   Certain foods have been studied for their potential to lower uric acid levels, including:  Coffee.  Studies have found an association between coffee drinking (regular and decaf) and lower uric acid levels.  The evidence is not enough to encourage  non-coffee drinkers to start, but it may give clues to new ways of treating gout in the future.  Vitamin C.  Supplements containing vitamin C may reduce the levels of uric acid in your blood.  However, vitamin as a treatment for gout. Don't assume that if a little vitamin C is good, than lots is better.  Megadoses of vitamin C may increase your bodies uric acid levels.  Cherries.  Cherries have been associated with lower levels of uric acid in studies, but it isn't clear if they have any effect on gout signs and symptoms.  Eating more cherries and other dar-colored fruits, such as blackberries, blueberries, purple grapes and raspberries, may be a safe way to support your gout treatment.    Lifestyle/Diet Recommendations:   Drink 8 to 16 cups ( about 2 to 4 liters) of fluid each day, with at least half being water.  Avoid alcohol  Eat a moderate amount of protein, preferably from healthy sources, such as low-fat or fat-free dairy, tofu, eggs, and nut butters.  Limit you daily intake of meat, fish, and poultry to 4 to 6 ounces.  Avoid high fat meats and desserts.  Decrease you intake of shellfish, beef, lamb, pork, eggs and cheese.  Choose a good source of vitamin C daily such as citrus fruits, strawberries, broccoli,  brussel sprouts, papaya, and cantaloupe.   Choose a good source of vitamin A every other day such as yellow fruits, or dark green/yellow vegetables.  Avoid drastic weigh reduction or fasting.  If weigh loss is desired lose it over a period of several months.  See "dietary considerations.." chart for specific food recommendations.  Dietary Considerations for people with Gout  Food with negligible purine content (0-15 mg of purine nitrogen per 100 grams food)  May use as desired except on calorie variations  Non fat milk Cocoa Cereals (except in list II) Hard candies  Buttermilk Carbonated drinks Vegetables (except in list II) Sherbet  Coffee Fruits Sugar Honey  Tea  Cottage Cheese Gelatin-jell-o Salt  Fruit juice Breads Angel food Cake   Herbs/spices Jams/Jellies [employer]    Foods that do not contain excessive purine content, but must be limited due to fat content  Cream Eggs Oil and Salad Dressing  Half and Half Peanut Butter Chocolate  Whole Milk Cakes Potato Chips  Butter Ice Cream Fried Foods  Cheese Nuts Waffles, pancakes   List II: Food with moderate purine content (50-150 mg of purine nitrogen per 100 grams of food)  Limit total amount each day to 5 oz. cooked Lean meat, other than those on list III   Poultry, other than those on list III Fish, other than those on list III   Seafood, other than those on list III  These foods may be used occasionally  Peas Lentils Bran  Spinach Oatmeal Dried Beans and Peas  Asparagus Wheat Germ Mushrooms   Additional information about meat choices  Choose fish  and poultry, particularly without skin, often.  Select lean, well trimmed cuts of meat.  Avoid all fatty meats, bacon , sausage, fried meats, fried fish, or poultry, luncheon meats, cold cuts, hot dogs, meats canned or frozen in gravy, spareribs and frozen and packaged prepared meats.   List III: Foods with HIGH purine content / Foods to AVOID (150-800 mg of purine nitrogen per 100 grams of food)  Anchovies Herring Meat Broths  Liver Mackerel Meat Extracts  Kidney Scallops Meat Drippings  Sardines Wild Game Mincemeat  Sweetbreads Goose Gravy  Heart Tongue Yeast, baker's and brewers   Commercial soups made with any of the foods listed in List II or List III  In addition avoid all alcoholic beverages

## 2023-01-30 NOTE — Progress Notes (Signed)
Sed rate is elevated and stable.  Urine protein creatinine ratio is elevated.  ENA (SCL 70, RNP, dsDNA, Smith, SSA, SSB) negative, C3-C4 normal.  Please check if patient is seeing a nephrologist.  If not she should be referred to nephrologist.  Please forward lab results to her nephrologist.  (ANA, anticardiolipin antibody, beta-2 GP 1 antibodies are still pending.)

## 2023-01-31 ENCOUNTER — Ambulatory Visit (HOSPITAL_COMMUNITY)
Admission: RE | Admit: 2023-01-31 | Discharge: 2023-01-31 | Disposition: A | Discharge status: Home / Self Care | Payer: Medicare PPO | Source: Ambulatory Visit | Attending: Cardiology | Admitting: Cardiology

## 2023-01-31 DIAGNOSIS — R609 Edema, unspecified: Secondary | ICD-10-CM | POA: Diagnosis not present

## 2023-02-01 LAB — BETA-2 GLYCOPROTEIN ANTIBODIES
Beta-2 Glyco 1 IgA: 6.2 U/mL (ref <20.0)
Beta-2 Glyco 1 IgM: 2.3 U/mL (ref <20.0)
Beta-2 Glyco I IgG: <2 U/mL (ref <20.0)

## 2023-02-01 LAB — CARDIOLIPIN ANTIBODIES, IGG, IGM, IGA
Anticardiolipin IgA: 22.6 [APL'U]/mL — ABNORMAL HIGH (ref <20.0)
Anticardiolipin IgG: <2 [GPL'U]/mL (ref <20.0)
Anticardiolipin IgM: 5.9 [MPL'U]/mL (ref <20.0)

## 2023-02-01 LAB — ANTI-NUCLEAR AB-TITER (ANA TITER)
ANA TITER: 1:1280 {titer} — ABNORMAL HIGH
ANA TITER: 1:320 {titer} — ABNORMAL HIGH
ANA Titer 1: 1:80 {titer} — ABNORMAL HIGH

## 2023-02-01 LAB — PROTEIN / CREATININE RATIO, URINE
Creatinine, Urine: 41 mg/dL (ref 20–275)
Protein/Creat Ratio: 1268 mg/g{creat} — ABNORMAL HIGH (ref 24–184)
Protein/Creatinine Ratio: 1.268 mg/mg{creat} — ABNORMAL HIGH (ref 0.024–0.184)
Total Protein, Urine: 52 mg/dL — ABNORMAL HIGH (ref 5–24)

## 2023-02-01 LAB — ANTI-SMITH ANTIBODY: ENA SM Ab Ser-aCnc: <1 AI

## 2023-02-01 LAB — SEDIMENTATION RATE: Sed Rate: 50 mm/h — ABNORMAL HIGH (ref 0–30)

## 2023-02-01 LAB — RNP ANTIBODY: Ribonucleic Protein(ENA) Antibody, IgG: <1 AI

## 2023-02-01 LAB — ANTI-SCLERODERMA ANTIBODY: Scleroderma (Scl-70) (ENA) Antibody, IgG: <1 AI

## 2023-02-01 LAB — C3 AND C4
C3 Complement: 180 mg/dL (ref 83–193)
C4 Complement: 34 mg/dL (ref 15–57)

## 2023-02-01 LAB — ANTI-DNA ANTIBODY, DOUBLE-STRANDED: ds DNA Ab: 1 [IU]/mL

## 2023-02-01 LAB — SJOGRENS SYNDROME-A EXTRACTABLE NUCLEAR ANTIBODY: SSA (Ro) (ENA) Antibody, IgG: <1 AI

## 2023-02-01 LAB — SJOGRENS SYNDROME-B EXTRACTABLE NUCLEAR ANTIBODY: SSB (La) (ENA) Antibody, IgG: <1 AI

## 2023-02-01 LAB — ANA: Anti Nuclear Antibody (ANA): POSITIVE — AB

## 2023-02-01 NOTE — Progress Notes (Signed)
Anticardiolipin IgA is low titer positive, ANA is positive, medication beta-2 GP with negative.  I will discuss results at the follow-up visit.

## 2023-02-08 NOTE — Telephone Encounter (Signed)
At 10:40 pt left a vm asking for a call to discuss her CPAP

## 2023-02-08 NOTE — Telephone Encounter (Signed)
Call to patient about CPAP reports she cannot use her machine because the cord will not reach her bed from the closest outlet, DME told her it was unsafe to use an extension cord. She states she was calling to tell us that she has not been using her CPAP but is going to try to sleep on the couch and see if she can get into compliance with her machine, right now on resmed, she is at 0%. She states she also got a letter from insurance but could find it to relay what it said but that the insurance told her they would also send a copy to the provider office, she wasn't sure if it was our office, but I said we will keep a look out for a letter. She states she will call back if anything else is needed. No further questions at this time

## 2023-02-14 NOTE — Progress Notes (Signed)
Office Visit Note  Patient: Dawn Wood             Date of Birth: 08-12-1945           MRN: [id number]             PCP: Dorothyann Peng, MD Referring: Dorothyann Peng, MD Visit Date: 02/28/2023 Occupation: @GUAROCC @  Subjective:  Swelling in feet   History of Present Illness: Dawn Wood is a 77 y.o. female returns today after her initial visit on January 28, 2023.  She states she continues to have swelling on her lower extremities.  She has intermittent discomfort in her feet.  She remains on colchicine 0.6 mg p.o. as needed for gout flares.  She states she continues to have soreness in her feet and it is difficult for her to decide if it is due to gout or fluid retention.  None of the other joints are painful.  There is no history of sicca symptoms, oral ulcers, nasal ulcers, malar rash, photosensitivity, Raynaud's or lymphadenopathy.  She continues to have some pain and stiffness in her bilateral hands and her knee joints in her feet.    Activities of Daily Living:  Patient reports morning stiffness for 5-10 minutes.   Patient Reports nocturnal pain.  Difficulty dressing/grooming: Reports Difficulty climbing stairs: Reports Difficulty getting out of chair: Reports Difficulty using hands for taps, buttons, cutlery, and/or writing: Denies  Review of Systems  Constitutional:  Positive for fatigue.  HENT:  Negative for mouth sores and mouth dryness.   Eyes:  Negative for dryness.  Respiratory:  Positive for shortness of breath.   Cardiovascular:  Negative for chest pain and palpitations.  Gastrointestinal:  Positive for constipation. Negative for blood in stool and diarrhea.  Endocrine: Negative for increased urination.  Genitourinary:  Negative for involuntary urination.  Musculoskeletal:  Positive for joint pain, joint pain, joint swelling, myalgias, muscle weakness, morning stiffness, muscle tenderness and myalgias. Negative for gait problem.  Skin:   Negative for color change, rash, hair loss and sensitivity to sunlight.  Allergic/Immunologic: Negative for susceptible to infections.  Neurological:  Positive for headaches. Negative for dizziness.  Hematological:  Negative for swollen glands.  Psychiatric/Behavioral:  Positive for depressed mood and sleep disturbance. The patient is nervous/anxious.     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) 11/30/2022   Shortness of breath 10/30/2022   Class 2 severe obesity due to excess calories with serious comorbidity and body mass index (BMI) of 35.0 to 35.9 in adult [facility]) 10/30/2022   Thyroid enlargement 10/21/2022   Sleep apnea 08/26/2022   Statin myopathy 02/05/2020   Type 2 diabetes mellitus with stage 3a chronic kidney disease, with long-term current use of insulin (HCC) 10/04/2019   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   Class 1 obesity due to excess calories with serious comorbidity and body mass index (BMI) of 34.0 to 34.9 in adult 10/04/2019   Gout 12/26/2017   Dyslipidemia 06/19/2014   PVD (peripheral vascular disease) (HCC) 12/13/2013   CAD in native artery 07/02/2013   Essential hypertension  07/02/2013   Diabetes mellitus type II, uncontrolled 07/02/2013   GERD (gastroesophageal reflux disease) 07/02/2013    Past Medical History:  Diagnosis Date   Coronary artery disease    Diabetes mellitus without complication (HCC)    GERD (gastroesophageal reflux disease)    Heart attack (HCC) 12/2022   Hypertension    MI (myocardial infarction) (HCC)     Peripheral vascular disease (HCC)    Sleep apnea     Family History  Problem Relation Age of Onset   Hypertension Mother    Hypertension Father    Throat cancer Father    Breast cancer Sister 67   Aortic stenosis Daughter    Heart disease Daughter        before age 51   Thyroid disease Daughter    Sleep apnea Neg Hx    Past Surgical History:  Procedure Laterality Date   ABDOMINAL HYSTERECTOMY  1985   APPENDECTOMY     CATARACT EXTRACTION Left 02/20/2018   Dr. Harlon Flor   CATARACT EXTRACTION Bilateral 2024   cataract surgery Right 01/25/2022   Second at [facility] 11/29   CORONARY STENT INTERVENTION N/A 11/30/2022   Procedure: CORONARY STENT INTERVENTION;  Surgeon: Swaziland, Peter M, MD;  Location: [facility] INVASIVE CV LAB;  Service: Cardiovascular;  Laterality: N/A;  LAD, RCA   CORONARY STENT PLACEMENT  2015   CORONARY ULTRASOUND/IVUS N/A 11/30/2022   Procedure: Coronary Ultrasound/IVUS;  Surgeon: Swaziland, Peter M, MD;  Location: [facility] INVASIVE CV LAB;  Service: Cardiovascular;  Laterality: N/A;  LAD, RCA   ectopic pregnancy--unilateral salpingectomy  1980   KNEE ARTHROSCOPY Left 03/2016   LEFT HEART CATH AND CORONARY ANGIOGRAPHY N/A 11/30/2022   Procedure: LEFT HEART CATH AND CORONARY ANGIOGRAPHY;  Surgeon: Swaziland, Peter M, MD;  Location: [facility] INVASIVE CV LAB;  Service: Cardiovascular;  Laterality: N/A;   unilateral oophorectomy     Social History   Social History Narrative   Not on file   Immunization History  Administered Date(s) Administered   Fluad Quad(high Dose 65+) 04/06/2021, 12/30/2021   Janssen (J&J) SARS-COV-2 Vaccination 05/25/2019   PFIZER(Purple Top)SARS-COV-2 Vaccination 02/21/2020   Tdap 11/04/2017     Objective: Vital Signs: BP (!) 167/80 (BP Location: Left Arm, Patient Position: Sitting, Cuff Size: Large)   Pulse (!) 55   Ht 5\' 5"  (1.651 m)   Wt 221 lb (100.2 kg)   BMI 36.78 kg/m    Physical Exam Vitals and nursing note reviewed.  Constitutional:      Appearance:  She is well-developed.  HENT:     Head: Normocephalic and atraumatic.  Eyes:     Conjunctiva/sclera: Conjunctivae normal.  Cardiovascular:     Rate and Rhythm: Normal rate and regular rhythm.     Heart sounds: Normal heart sounds.  Pulmonary:     Effort: Pulmonary effort is normal.     Breath sounds: Normal breath sounds.  Abdominal:     General: Bowel sounds are normal.     Palpations: Abdomen is soft.  Musculoskeletal:     Cervical back: Normal range of motion.  Lymphadenopathy:     Cervical: No cervical adenopathy.  Skin:    General: Skin is warm and dry.     Capillary Refill: Capillary refill takes less than 2 seconds.  Neurological:     Mental Status: She is alert and oriented to person, place, and time.  Psychiatric:        Behavior: Behavior normal.      Musculoskeletal Exam: Cervical spine was  in good range of motion.  She had no tenderness over thoracic or lumbar spine.  She had discomfort with range of motion of her lumbar spine.  Patient was examined in the seated position.  Shoulders, elbows, wrist joints, MCPs PIPs and DIPs were in good range of motion with no synovitis.  Mucinous cyst was noted over the right first PIP joint.  Hip joints could not be assessed.  Knee joints with good range of motion without any warmth swelling or effusion.  She had bilateral lower extremity pitting edema.  There was no tenderness over ankles or MTPs.  CDAI Exam: CDAI Score: -- Patient Global: --; Provider Global: -- Swollen: --; Tender: -- Joint Exam 02/28/2023   No joint exam has been documented for this visit   There is currently no information documented on the homunculus. Go to the Rheumatology activity and complete the homunculus joint exam.  Investigation: No additional findings.  Imaging: US RENAL Result Date: 02/25/2023 CLINICAL DATA:  Chronic renal disease EXAM: RENAL / URINARY TRACT ULTRASOUND COMPLETE COMPARISON:  Renal ultrasound 02/20/2021 FINDINGS: Right  Kidney: Renal measurements: 10.5 x 4.4 x 4.2 cm = volume: 102 mL. Echogenicity within normal limits. No mass or hydronephrosis visualized. Left Kidney: Renal measurements: 12.0 x 5.1 x 4.8 cm = volume: 151 mL. Echogenicity within normal limits. No mass or hydronephrosis visualized. Bladder: Appears normal for degree of bladder distention. Other: None. IMPRESSION: No hydronephrosis. Electronically Signed   By: Annia Belt M.D.   On: 02/25/2023 11:25   VAS [place] UPPER EXTREMITY VENOUS DUPLEX Result Date: 01/31/2023 UPPER VENOUS STUDY  Patient Name:  CHANNEL TEE  Date of Exam:   01/31/2023 Medical Rec #: [id number]               Accession #:    [id number] Date of Birth: [birth year]               Patient Gender: F Patient Age:   28 years Exam Location:  [facility] Procedure:      VAS [place] UPPER EXTREMITY VENOUS DUPLEX Referring Phys: Julian Hy CONTE --------------------------------------------------------------------------------  Indications: Patient has had swelling in all 4 extremeties, especially in the right arm where she had the cardiac catheterization on 11/30/2022. Patient denies chest pain but has continous SOB since then. Currently taking 2 diurectics. Performing Technologist: Carlos American RVT, RDCS (AE), RDMS  Examination Guidelines: A complete evaluation includes B-mode imaging, spectral Doppler, color Doppler, and power Doppler as needed of all accessible portions of each vessel. Bilateral testing is considered an integral part of a complete examination. Limited examinations for reoccurring indications may be performed as noted.  Right Findings: +----------+------------+---------+-----------+----------+-------+ RIGHT     CompressiblePhasicitySpontaneousPropertiesSummary +----------+------------+---------+-----------+----------+-------+ IJV           Full       Yes       Yes                      +----------+------------+---------+-----------+----------+-------+ Subclavian               Yes        Yes                      +----------+------------+---------+-----------+----------+-------+ Axillary      Full       Yes       Yes                      +----------+------------+---------+-----------+----------+-------+ Brachial  Full       Yes       Yes                      +----------+------------+---------+-----------+----------+-------+ Radial        Full                                          +----------+------------+---------+-----------+----------+-------+ Ulnar         Full                                          +----------+------------+---------+-----------+----------+-------+ Cephalic      Full                                          +----------+------------+---------+-----------+----------+-------+ Basilic       Full                                          +----------+------------+---------+-----------+----------+-------+ No DVT or SVT seen.  Findings reported to Jari Favre thru secure chat in EPIC at 3:00 pm.  Summary:  Right: No evidence of deep vein thrombosis in the upper extremity. No evidence of superficial vein thrombosis in the upper extremity. No evidence of thrombosis in the subclavian.  *See table(s) above for measurements and observations.  Diagnosing physician: Carolynn Sayers Electronically signed by Carolynn Sayers on 01/31/2023 at 3:22:34 PM.    Final     Recent Labs: Lab Results  Component Value Date   WBC 9.3 12/04/2022   HGB 13.1 12/04/2022   PLT 198 12/04/2022   NA 145 (H) 01/19/2023   K 4.5 01/19/2023   CL 104 01/19/2023   CO2 21 01/19/2023   GLUCOSE 63 (L) 01/19/2023   BUN 19 01/19/2023   CREATININE 1.02 (H) 01/19/2023   BILITOT 1.2 11/30/2022   ALKPHOS 64 11/30/2022   AST 16 11/30/2022   ALT 15 11/30/2022   PROT 7.4 11/30/2022   ALBUMIN 3.6 11/30/2022   CALCIUM 9.5 01/19/2023   GFRAA 53 (L) 02/11/2020   January 28, 2023 ANA 1: 80 NH, 1: 320 NS, 1: 1280 nuclear dots, ENA (dsDNA, SSA, SSB, Smith, RNP,  SCL 70) negative, C3-C4 normal, ESR 50, anticardiolipin IgA 22.6, beta-2 GP 1 negative, urine protein creatinine ratio 1.268 Speciality Comments: Allopurinol-diarrhea  Procedures:  No procedures performed Allergies: Dust mite extract, Other, Statins, Allopurinol, and Pollen extract   Assessment / Plan:     Visit Diagnoses: Positive ANA (antinuclear antibody) - Positive ANA, ENA negative, complements normal, sed rate elevated.  There is no history of oral ulcers, nasal ulcers, malar rash, photosensitivity, Raynaud's, or lymphadenopathy.  Advised patient to contact me if she develops any new symptoms.  At this time she does not have any clinical features of lupus.  Other proteinuria -patient states that she has a nephrologist and she follows up with the nephrologist closely.  Idiopathic chronic gout of multiple sites without tophus - Aug 05, 2022 uric acid 9.4.  Diagnosed 2020.  Patient remains on colchicine.  Patient states she does not like taking colchicine  on a regular basis and takes it only as needed.  She reports that she developed diarrhea on allopurinol and does not want to try it again.  She has very limited options.  We discussed possible use of probenecid which may interfere with the aspirin use and also Lasix.  She may discuss that with further with her nephrologist.  Thyra Breed would be another option but it has blackbox warning regarding increased risk of heart attack.  Patient did not like the side effects.  I do not have much to offer at this point.  Pain in both hands - No synovitis was noted.  Clinical findings suggestive of osteoarthritis.  Bilateral second and third MCP narrowing was noted most likely related to the manual work she has done in the past.  No synovitis was noted.  Joint protection muscle strengthening was discussed.  A handout on hand exercises was given.  Primary osteoarthritis of both knees - No joint inflammation was noted.  Bilateral moderate osteoarthritis and mild  chondromalacia patella was noted on the x-rays.  A handout on lower extremity exercises was given.  Pain in both feet - No synovitis noted.  X-rays showed osteoarthritic changes and erosive changes consistent with gouty arthropathy.  X-ray findings were reviewed with the patient.  Proper fitting shoes were advised.  Essential hypertension-blood pressure was elevated today at 167/80.  She was advised to monitor blood pressure closely and follow-up with her PCP.  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  No orders of the defined types were placed in this encounter.    Follow-Up Instructions: Return if symptoms worsen or fail to improve, for Gout, Osteoarthritis.   Pollyann Savoy, MD  Note - This record has been created using Animal nutritionist.  Chart creation errors have been sought, but may not always  have been located. Such creation errors do not reflect on  the standard of medical care.

## 2023-02-17 DIAGNOSIS — G4733 Obstructive sleep apnea (adult) (pediatric): Secondary | ICD-10-CM | POA: Diagnosis not present

## 2023-02-21 DIAGNOSIS — I129 Hypertensive chronic kidney disease with stage 1 through stage 4 chronic kidney disease, or unspecified chronic kidney disease: Secondary | ICD-10-CM | POA: Diagnosis not present

## 2023-02-21 DIAGNOSIS — D631 Anemia in chronic kidney disease: Secondary | ICD-10-CM | POA: Diagnosis not present

## 2023-02-21 DIAGNOSIS — N2581 Secondary hyperparathyroidism of renal origin: Secondary | ICD-10-CM | POA: Diagnosis not present

## 2023-02-21 DIAGNOSIS — N1831 Chronic kidney disease, stage 3a: Secondary | ICD-10-CM | POA: Diagnosis not present

## 2023-02-21 DIAGNOSIS — N189 Chronic kidney disease, unspecified: Secondary | ICD-10-CM | POA: Diagnosis not present

## 2023-02-22 ENCOUNTER — Other Ambulatory Visit: Payer: Self-pay | Admitting: Nephrology

## 2023-02-22 ENCOUNTER — Other Ambulatory Visit: Payer: Self-pay | Admitting: Nurse Practitioner

## 2023-02-22 DIAGNOSIS — N1831 Chronic kidney disease, stage 3a: Secondary | ICD-10-CM

## 2023-02-24 ENCOUNTER — Ambulatory Visit
Admission: RE | Admit: 2023-02-24 | Discharge: 2023-02-24 | Disposition: A | Discharge status: Home / Self Care | Payer: Medicare PPO | Source: Ambulatory Visit | Attending: Nephrology | Admitting: Nephrology

## 2023-02-24 DIAGNOSIS — N1831 Chronic kidney disease, stage 3a: Secondary | ICD-10-CM

## 2023-02-24 DIAGNOSIS — N189 Chronic kidney disease, unspecified: Secondary | ICD-10-CM | POA: Diagnosis not present

## 2023-02-25 DIAGNOSIS — H401131 Primary open-angle glaucoma, bilateral, mild stage: Secondary | ICD-10-CM | POA: Diagnosis not present

## 2023-02-26 ENCOUNTER — Other Ambulatory Visit (HOSPITAL_COMMUNITY): Payer: Self-pay

## 2023-02-26 ENCOUNTER — Other Ambulatory Visit: Payer: Self-pay | Admitting: Nurse Practitioner

## 2023-02-28 ENCOUNTER — Encounter: Payer: Self-pay | Admitting: Rheumatology

## 2023-02-28 ENCOUNTER — Telehealth: Payer: Self-pay | Admitting: Internal Medicine

## 2023-02-28 ENCOUNTER — Ambulatory Visit: Payer: Medicare PPO | Attending: Rheumatology | Admitting: Rheumatology

## 2023-02-28 VITALS — BP 167/80 | HR 55 | Ht 65.0 in | Wt 221.0 lb

## 2023-02-28 DIAGNOSIS — I10 Essential (primary) hypertension: Secondary | ICD-10-CM | POA: Diagnosis not present

## 2023-02-28 DIAGNOSIS — M79671 Pain in right foot: Secondary | ICD-10-CM

## 2023-02-28 DIAGNOSIS — R768 Other specified abnormal immunological findings in serum: Secondary | ICD-10-CM

## 2023-02-28 DIAGNOSIS — E1122 Type 2 diabetes mellitus with diabetic chronic kidney disease: Secondary | ICD-10-CM

## 2023-02-28 DIAGNOSIS — I2109 ST elevation (STEMI) myocardial infarction involving other coronary artery of anterior wall: Secondary | ICD-10-CM

## 2023-02-28 DIAGNOSIS — M79641 Pain in right hand: Secondary | ICD-10-CM | POA: Diagnosis not present

## 2023-02-28 DIAGNOSIS — I1310 Hypertensive heart and chronic kidney disease without heart failure, with stage 1 through stage 4 chronic kidney disease, or unspecified chronic kidney disease: Secondary | ICD-10-CM | POA: Diagnosis not present

## 2023-02-28 DIAGNOSIS — N1831 Chronic kidney disease, stage 3a: Secondary | ICD-10-CM

## 2023-02-28 DIAGNOSIS — M79642 Pain in left hand: Secondary | ICD-10-CM

## 2023-02-28 DIAGNOSIS — M170 Bilateral primary osteoarthritis of knee: Secondary | ICD-10-CM | POA: Diagnosis not present

## 2023-02-28 DIAGNOSIS — M1A09X0 Idiopathic chronic gout, multiple sites, without tophus (tophi): Secondary | ICD-10-CM | POA: Diagnosis not present

## 2023-02-28 DIAGNOSIS — G720 Drug-induced myopathy: Secondary | ICD-10-CM

## 2023-02-28 DIAGNOSIS — I480 Paroxysmal atrial fibrillation: Secondary | ICD-10-CM

## 2023-02-28 DIAGNOSIS — E785 Hyperlipidemia, unspecified: Secondary | ICD-10-CM

## 2023-02-28 DIAGNOSIS — I2102 ST elevation (STEMI) myocardial infarction involving left anterior descending coronary artery: Secondary | ICD-10-CM

## 2023-02-28 DIAGNOSIS — G4733 Obstructive sleep apnea (adult) (pediatric): Secondary | ICD-10-CM

## 2023-02-28 DIAGNOSIS — Z794 Long term (current) use of insulin: Secondary | ICD-10-CM

## 2023-02-28 DIAGNOSIS — M79672 Pain in left foot: Secondary | ICD-10-CM

## 2023-02-28 DIAGNOSIS — E049 Nontoxic goiter, unspecified: Secondary | ICD-10-CM

## 2023-02-28 DIAGNOSIS — T466X5A Adverse effect of antihyperlipidemic and antiarteriosclerotic drugs, initial encounter: Secondary | ICD-10-CM

## 2023-02-28 DIAGNOSIS — D6859 Other primary thrombophilia: Secondary | ICD-10-CM

## 2023-02-28 DIAGNOSIS — I2510 Atherosclerotic heart disease of native coronary artery without angina pectoris: Secondary | ICD-10-CM

## 2023-02-28 DIAGNOSIS — I739 Peripheral vascular disease, unspecified: Secondary | ICD-10-CM

## 2023-02-28 DIAGNOSIS — R808 Other proteinuria: Secondary | ICD-10-CM | POA: Diagnosis not present

## 2023-02-28 DIAGNOSIS — K219 Gastro-esophageal reflux disease without esophagitis: Secondary | ICD-10-CM

## 2023-02-28 MED ORDER — POTASSIUM CHLORIDE ER 10 MEQ PO TBCR: 10.0000 meq | EXTENDED_RELEASE_TABLET | Freq: Every day | ORAL | 3 refills | Status: DC

## 2023-02-28 NOTE — Patient Instructions (Addendum)
Exercises for Chronic Knee Pain Chronic knee pain is pain that lasts longer than 3 months. For most people with chronic knee pain, exercise and weight loss is an important part of treatment. Your health care provider may want you to focus on: Making the muscles that support your knee stronger. This can take pressure off your knee and reduce pain. Preventing knee stiffness. How far you can move your knee, keeping it there or making it farther. Losing weight (if this applies) to take pressure off your knee, lower your risk for injury, and make it easier for you to exercise. Your provider will help you make an exercise program that fits your needs and physical abilities. Below are simple, low-impact exercises you can do at home. Ask your provider or physical therapist how often you should do your exercise program and how many times to repeat each exercise. General safety tips  Get your provider's approval before doing any exercises. Start slowly and stop any time you feel pain. Do not exercise if your knee pain is flaring up. Warm up first. Stretching a cold muscle can cause an injury. Do 5-10 minutes of easy movement or light stretching before beginning your exercises. Do 5-10 minutes of low-impact activity (like walking or cycling) before starting strengthening exercises. Contact your provider any time you have pain during or after exercising. Exercise can cause discomfort but should not be painful. It is normal to be a little stiff or sore after exercising. Stretching and range-of-motion exercises Front thigh stretch  Stand up straight and support your body by holding on to a chair or resting one hand on a wall. With your legs straight and close together, bend one knee to lift your heel up toward your butt. Using one hand for support, grab your ankle with your free hand. Pull your foot up closer toward your butt to feel the stretch in front of your thigh. Hold the stretch for 30  seconds. Repeat __________ times. Complete this exercise __________ times a day. Back thigh stretch  Sit on the floor with your back straight and your legs out straight in front of you. Place the palms of your hands on the floor and slide them toward your feet as you bend at the hip. Try to touch your nose to your knees and feel the stretch in the back of your thighs. Hold for 30 seconds. Repeat __________ times. Complete this exercise __________ times a day. Calf stretch  Stand facing a wall. Place the palms of your hands flat against the wall, arms extended, and lean slightly against the wall. Get into a lunge position with one leg bent at the knee and the other leg stretched out straight behind you. Keep both feet facing the wall and increase the bend in your knee while keeping the heel of the other leg flat on the ground. You should feel the stretch in your calf. Hold for 30 seconds. Repeat __________ times. Complete this exercise __________ times a day. Strengthening exercises Straight leg lift  Lie on your back with one knee bent and the other leg out straight. Slowly lift the straight leg without bending the knee. Lift until your foot is about 12 inches (30 cm) off the floor. Hold for 3-5 seconds and slowly lower your leg. Repeat __________ times. Complete this exercise __________ times a day. Single leg dip  Stand between two chairs and put both hands on the backs of the chairs for support. Extend one leg out straight with your body   weight resting on the heel of the standing leg. Slowly bend your standing knee to dip your body to the level that is comfortable for you. Hold for 3-5 seconds. Repeat __________ times. Complete this exercise __________ times a day. Hamstring curls  Stand straight, knees close together, facing the back of a chair. Hold on to the back of a chair with both hands. Keep one leg straight. Bend the other knee while bringing the heel up toward the butt  until the knee is bent at a 90-degree angle (right angle). Hold for 3-5 seconds. Repeat __________ times. Complete this exercise __________ times a day. Wall squat  Stand straight with your back, hips, and head against a wall. Step forward one foot at a time with your back still against the wall. Your feet should be 2 feet (61 cm) from the wall at shoulder width. Keeping your back, hips, and head against the wall, slide down the wall to as close to a sitting position as you can get. Hold for 5-10 seconds, then slowly slide back up. Repeat __________ times. Complete this exercise __________ times a day. Step-ups  Stand in front of a sturdy platform or stool that is about 6 inches (15 cm) high. Slowly step up with your left / right foot, keeping your knee in line with your hip and foot. Do not let your knee bend so far that you cannot see your toes. Hold on to a chair for balance, but do not use it for support. Slowly unlock your knee and lower yourself to the starting position. Repeat __________ times. Complete this exercise __________ times a day. Contact a health care provider if: Your exercises cause pain. Your pain is worse after you exercise. Your pain prevents you from doing your exercises. This information is not intended to replace advice given to you by your health care provider. Make sure you discuss any questions you have with your health care provider. Document Revised: 03/09/2022 Document Reviewed: 03/09/2022 Elsevier Patient Education  2024 Elsevier Inc.  Hand Exercises Hand exercises can be helpful for almost anyone. They can strengthen your hands and improve flexibility and movement. The exercises can also increase blood flow to the hands. These results can make your work and daily tasks easier for you. Hand exercises can be especially helpful for people who have joint pain from arthritis or nerve damage from using their hands over and over. These exercises can also help  people who injure a hand. Exercises Most of these hand exercises are gentle stretching and motion exercises. It is usually safe to do them often throughout the day. Warming up your hands before exercise may help reduce stiffness. You can do this with gentle massage or by placing your hands in warm water for 10-15 minutes. It is normal to feel some stretching, pulling, tightness, or mild discomfort when you begin new exercises. In time, this will improve. Remember to always be careful and stop right away if you feel sudden, very bad pain or your pain gets worse. You want to get better and be safe. Ask your health care provider which exercises are safe for you. Do exercises exactly as told by your provider and adjust them as told. Do not begin these exercises until told by your provider. Knuckle bend or "claw" fist  Stand or sit with your arm, hand, and all five fingers pointed straight up. Make sure to keep your wrist straight. Gently bend your fingers down toward your palm until the tips of your   fingers are touching your palm. Keep your big knuckle straight and only bend the small knuckles in your fingers. Hold this position for 10 seconds. Straighten your fingers back to your starting position. Repeat this exercise 5-10 times with each hand. Full finger fist  Stand or sit with your arm, hand, and all five fingers pointed straight up. Make sure to keep your wrist straight. Gently bend your fingers into your palm until the tips of your fingers are touching the middle of your palm. Hold this position for 10 seconds. Extend your fingers back to your starting position, stretching every joint fully. Repeat this exercise 5-10 times with each hand. Straight fist  Stand or sit with your arm, hand, and all five fingers pointed straight up. Make sure to keep your wrist straight. Gently bend your fingers at the big knuckle, where your fingers meet your hand, and at the middle knuckle. Keep the knuckle at  the tips of your fingers straight and try to touch the bottom of your palm. Hold this position for 10 seconds. Extend your fingers back to your starting position, stretching every joint fully. Repeat this exercise 5-10 times with each hand. Tabletop  Stand or sit with your arm, hand, and all five fingers pointed straight up. Make sure to keep your wrist straight. Gently bend your fingers at the big knuckle, where your fingers meet your hand, as far down as you can. Keep the small knuckles in your fingers straight. Think of forming a tabletop with your fingers. Hold this position for 10 seconds. Extend your fingers back to your starting position, stretching every joint fully. Repeat this exercise 5-10 times with each hand. Finger spread  Place your hand flat on a table with your palm facing down. Make sure your wrist stays straight. Spread your fingers and thumb apart from each other as far as you can until you feel a gentle stretch. Hold this position for 10 seconds. Bring your fingers and thumb tight together again. Hold this position for 10 seconds. Repeat this exercise 5-10 times with each hand. Making circles  Stand or sit with your arm, hand, and all five fingers pointed straight up. Make sure to keep your wrist straight. Make a circle by touching the tip of your thumb to the tip of your index finger. Hold for 10 seconds. Then open your hand wide. Repeat this motion with your thumb and each of your fingers. Repeat this exercise 5-10 times with each hand. Thumb motion  Sit with your forearm resting on a table and your wrist straight. Your thumb should be facing up toward the ceiling. Keep your fingers relaxed as you move your thumb. Lift your thumb up as high as you can toward the ceiling. Hold for 10 seconds. Bend your thumb across your palm as far as you can, reaching the tip of your thumb for the small finger (pinkie) side of your palm. Hold for 10 seconds. Repeat this exercise  5-10 times with each hand. Grip strengthening  Hold a stress ball or other soft ball in the middle of your hand. Slowly increase the pressure, squeezing the ball as much as you can without causing pain. Think of bringing the tips of your fingers into the middle of your palm. All of your finger joints should bend when doing this exercise. Hold your squeeze for 10 seconds, then relax. Repeat this exercise 5-10 times with each hand. Contact a health care provider if: Your hand pain or discomfort gets much worse when   you do an exercise. Your hand pain or discomfort does not improve within 2 hours after you exercise. If you have either of these problems, stop doing these exercises right away. Do not do them again unless your provider says that you can. Get help right away if: You develop sudden, severe hand pain or swelling. If this happens, stop doing these exercises right away. Do not do them again unless your provider says that you can. This information is not intended to replace advice given to you by your health care provider. Make sure you discuss any questions you have with your health care provider. Document Revised: 03/09/2022 Document Reviewed: 03/09/2022 Elsevier Patient Education  2024 Elsevier Inc.  

## 2023-02-28 NOTE — Telephone Encounter (Signed)
*  STAT* If patient is at the pharmacy, call can be transferred to refill team.   1. Which medications need to be refilled? (please list name of each medication and dose if known)   potassium chloride (KLOR-CON) 10 MEQ tablet    2. Which pharmacy/location (including street and city if local pharmacy) is medication to be sent to?[id number] - [place], [place] - [address] AT [address]   3. Do they need a 30 day or 90 day supply? 30 day

## 2023-02-28 NOTE — Telephone Encounter (Signed)
Pt's medication was sent to pt's pharmacy as requested. Confirmation received.  °

## 2023-03-01 ENCOUNTER — Other Ambulatory Visit: Payer: Self-pay | Admitting: Pharmacist

## 2023-03-01 NOTE — Progress Notes (Signed)
Care Coordination Call  Received message from patient. She has her portion of [employer] reapplication for [employer] and Novolog 70/30 to bring to the office. We will complete provider portion and submit.   She notes she has not been taking Ozempic due to "seeing things on TV about it". Discussed common side effects. Patient was tolerating well when she was taking. Discussed CV benefit of Ozempic. She will restart Ozempic at 0.25 mg weekly for 4 weeks then increase back to 0.5 mg weekly.   Follow up scheduled in January.   Catie Eppie Gibson, PharmD, BCACP, CPP Clinical Pharmacist [facility] [phone]

## 2023-03-04 ENCOUNTER — Other Ambulatory Visit: Payer: Self-pay

## 2023-03-04 MED ORDER — AMLODIPINE BESYLATE 5 MG PO TABS: 5.0000 mg | ORAL_TABLET | Freq: Every day | ORAL | 3 refills | Status: DC

## 2023-03-08 ENCOUNTER — Ambulatory Visit: Payer: Medicare PPO | Admitting: Neurology

## 2023-03-11 ENCOUNTER — Encounter: Payer: Medicare PPO | Admitting: Rheumatology

## 2023-03-20 DIAGNOSIS — G4733 Obstructive sleep apnea (adult) (pediatric): Secondary | ICD-10-CM | POA: Diagnosis not present

## 2023-03-21 ENCOUNTER — Telehealth: Payer: Self-pay | Admitting: Internal Medicine

## 2023-03-21 NOTE — Telephone Encounter (Signed)
 PT CALLED STATING SHE NEEDED TO URGENTLY SPEAK WITH THE PHARMACIST AND HAS BEEN CALLING SINCE 03/16/23

## 2023-03-29 ENCOUNTER — Telehealth: Payer: Self-pay | Admitting: Neurology

## 2023-03-29 NOTE — Telephone Encounter (Signed)
Pt has called and r/s her appointment that was scheduled for tomorrow out of concern for potential snow storm .  Pt scheduled with MD because NP was booking months out beyond time frame pt needs to be scheduled within.

## 2023-03-30 ENCOUNTER — Encounter: Payer: Medicare PPO | Admitting: Neurology

## 2023-03-31 ENCOUNTER — Encounter (HOSPITAL_BASED_OUTPATIENT_CLINIC_OR_DEPARTMENT_OTHER): Payer: Self-pay | Admitting: Pulmonary Disease

## 2023-03-31 ENCOUNTER — Ambulatory Visit (HOSPITAL_BASED_OUTPATIENT_CLINIC_OR_DEPARTMENT_OTHER): Payer: Medicare PPO | Admitting: Pulmonary Disease

## 2023-03-31 VITALS — BP 148/74 | HR 70 | Resp 18 | Ht 65.0 in | Wt 220.0 lb

## 2023-03-31 DIAGNOSIS — R062 Wheezing: Secondary | ICD-10-CM | POA: Diagnosis not present

## 2023-03-31 DIAGNOSIS — R0602 Shortness of breath: Secondary | ICD-10-CM

## 2023-03-31 MED ORDER — ALBUTEROL SULFATE HFA 108 (90 BASE) MCG/ACT IN AERS: 2.0000 | INHALATION_SPRAY | Freq: Four times a day (QID) | RESPIRATORY_TRACT | 2 refills | Status: DC | PRN

## 2023-03-31 NOTE — Patient Instructions (Signed)
Shortness of breath - likely multifactorial with prior cardiac history, deconditioning --START Albuterol 1-2 puffs AS NEEDED every 4-6 hours --REFER to pulmonary rehab --ORDER pulmonary function test

## 2023-03-31 NOTE — Progress Notes (Signed)
Subjective:   PATIENT ID: Dawn Wood GENDER: female DOB: [birth year], MRN: [id number]  Chief Complaint  Patient presents with   Consult    SOB since last year, unable to do much around the house before she gets out of breath. Has been worse since heart attack in September    Reason for Visit: New consult for shortness of breath  Dawn Wood is a 78 year old female former smoker with CAD, MI in 2015 and 2024, HTN, HLD, PVD, atrial fibrillation, OSA,, DM2 who presents for shortness of breath.  She has had a year of shortness of breath with exertion and noticeable worsening after her heart attack in September three months ago where she had stent placed in pLAD. Associated with coughing and wheezing. Not necessarily with activity. Denies preceding illness. Denies childhood asthma and mother with asthma. Since her retirement in 08/2021 she is less active. Has shortness of breath with moving trash can. She has never been on maintenance or rescue inhalers.  Social History: Former smoker. Quit 8 years. 1/2 ppd x 45 years Previously worked intermittently in the [employer] with exposure to chemicals  I have personally reviewed patient's past medical/family/social history, allergies, current medications.  Past Medical History:  Diagnosis Date   Coronary artery disease    Diabetes mellitus without complication (HCC)    GERD (gastroesophageal reflux disease)    Heart attack (HCC) 12/2022   Hypertension    MI (myocardial infarction) (HCC)    Peripheral vascular disease (HCC)    Sleep apnea      Family History  Problem Relation Age of Onset   Hypertension Mother    Hypertension Father    Throat cancer Father    Breast cancer Sister 41   Aortic stenosis Daughter    Heart disease Daughter        before age 10   Thyroid disease Daughter    Sleep apnea Neg Hx      Social History   Occupational History   Not on file  Tobacco Use    Smoking status: Former    Current packs/day: 0.00    Average packs/day: 0.3 packs/day for 45.0 years (11.3 ttl pk-yrs)    Types: Cigarettes    Start date: 01/11/1968    Quit date: 01/10/2013    Years since quitting: 10.2    Passive exposure: Never   Smokeless tobacco: Never  Vaping Use   Vaping status: Never Used  Substance and Sexual Activity   Alcohol use: Not Currently    Alcohol/week: 0.0 standard drinks of alcohol   Drug use: No   Sexual activity: Not Currently    Allergies  Allergen Reactions   Dust Mite Extract Other (See Comments)    REACTION: SNEEZING   Other Other (See Comments)    ALLERGEN: DEODORIZERS SPRAY AND PERFUMES REACTION: SNEEZING   Statins Other (See Comments)    Muscle aches with rosuvastatin 5 mg daily and 3x/weekly, atorvastatin 20 mg daily and 3x/week, simvastatin 40 mg daily    Allopurinol     Diarrhea and the pt said it made her sugars low   Pollen Extract Other (See Comments)    Reaction unknown     Outpatient Medications Prior to Visit  Medication Sig Dispense Refill   Accu-Chek Softclix Lancets lancets USE TO CHECK BLOOD SUGARS ONCE DAILY. 100 each 12   amLODipine (NORVASC) 5 MG tablet Take 1 tablet (5 mg total) by mouth daily. 90 tablet 3  apixaban (ELIQUIS) 5 MG TABS tablet Take 1 tablet (5 mg total) by mouth 2 (two) times daily. 180 tablet 1   aspirin 81 MG chewable tablet Chew 1 tablet (81 mg total) by mouth daily. 30 tablet 0   BD PEN NEEDLE NANO 2ND GEN 32G X 4 MM MISC USE AS DIRECTED TWICE DAILY 100 each 2   Blood Glucose Monitoring Suppl (ACCU-CHEK GUIDE) w/Device KIT USE TO CHECK BLOOD SUGARS ONCE DAILY. 1 kit 0   Cholecalciferol (VITAMIN D3) 2000 units TABS Take 1 tablet by mouth daily.     clopidogrel (PLAVIX) 75 MG tablet Take 1 tablet (75 mg total) by mouth daily with breakfast. 90 tablet 2   colchicine 0.6 MG tablet Take 0.6 mg by mouth as needed (for gout).     Evolocumab (REPATHA SURECLICK) 140 MG/ML SOAJ Inject 140 mg into the  skin every 14 (fourteen) days. 6 mL 1   fluticasone (FLONASE) 50 MCG/ACT nasal spray Place 2 sprays into both nostrils daily. 16 g 2   furosemide (LASIX) 20 MG tablet TAKE 1 TABLET(20 MG) BY MOUTH DAILY 30 tablet 10   glucose blood (ACCU-CHEK GUIDE) test strip USE TO CHECK BLOOD SUGARS ONCE DAILY. 100 each 12   insulin aspart protamine - aspart (NOVOLOG MIX 70/30 FLEXPEN) (70-30) 100 UNIT/ML FlexPen INJECT 40 UNITS SUBCUTANEOUS EVERY MORNING AND 50 UNITS SUBCUTANEOUS AT DINNER 75 mL 2   isosorbide mononitrate (IMDUR) 60 MG 24 hr tablet Take 1 tablet (60 mg total) by mouth daily. Pt takes 1 tablet by mouth daily. 30 tablet 7   ketorolac (ACULAR) 0.5 % ophthalmic solution Place 1 drop into the right eye 3 (three) times daily.     latanoprost (XALATAN) 0.005 % ophthalmic solution      metoprolol succinate (TOPROL-XL) 100 MG 24 hr tablet Take 1.5 tablets (150 mg total) by mouth daily. Take with or immediately following a meal. 135 tablet 3   nitroGLYCERIN (NITROSTAT) 0.4 MG SL tablet Place 1 tablet (0.4 mg total) under the tongue every 5 (five) minutes as needed for chest pain. 25 tablet 11   olmesartan (BENICAR) 40 MG tablet TAKE 1 TABLET(40 MG) BY MOUTH DAILY 90 tablet 2   ONETOUCH VERIO test strip USE TO CHECK BLOOD SUGARS TWICE DAILY AS DIRECTED 100 strip 3   pantoprazole (PROTONIX) 40 MG tablet Take 1 tablet (40 mg total) by mouth daily. 90 tablet 1   potassium chloride (KLOR-CON) 10 MEQ tablet Take 1 tablet (10 mEq total) by mouth daily. 90 tablet 3   Semaglutide,0.25 or 0.5MG /DOS, (OZEMPIC, 0.25 OR 0.5 MG/DOSE,) 2 MG/1.5ML SOPN INJECT 0.5MG  INTO SKIN ONCE A WEEK 4.5 mL 3   sodium chloride (MURO 128) 5 % ophthalmic solution Place 1 drop into the right eye 2 (two) times daily.     spironolactone (ALDACTONE) 25 MG tablet Take 1 tablet (25 mg total) by mouth daily. 90 tablet 2   traMADol (ULTRAM) 50 MG tablet Take 50 mg by mouth daily at 6 (six) AM.     VYZULTA 0.024 % SOLN Apply 1 drop to eye at  bedtime.     dorzolamide-timolol (COSOPT) 2-0.5 % ophthalmic solution Place 1 drop into the right eye 2 (two) times daily.     No facility-administered medications prior to visit.    Review of Systems  Constitutional:  Negative for chills, diaphoresis, fever, malaise/fatigue and weight loss.  HENT:  Negative for congestion.   Respiratory:  Positive for cough and wheezing. Negative for hemoptysis, sputum production  and shortness of breath.   Cardiovascular:  Negative for chest pain, palpitations and leg swelling.     Objective:   Vitals:   03/31/23 1431  BP: (!) 148/74  Pulse: 70  Resp: 18  SpO2: 99%  Weight: 220 lb (99.8 kg)  Height: 5\' 5"  (1.651 m)   SpO2: 99 %  Physical Exam: General: Well-appearing, no acute distress HENT: , AT Eyes: EOMI, no scleral icterus Respiratory: Clear to auscultation bilaterally.  No crackles, wheezing or rales Cardiovascular: RRR, -M/R/G, no JVD Extremities:-Edema,-tenderness Neuro: AAO x4, CNII-XII grossly intact Psych: Normal mood, normal affect  Data Reviewed:  Imaging: CT Chest 08/13/22 - Mild peripheral subpleural reticulation at lung bases. Parenchymal banding identified within lingular and lung bases  PFT: None on file  Labs: CBC    Component Value Date/Time   WBC 9.3 12/04/2022 0424   RBC 4.57 12/04/2022 0424   HGB 13.1 12/04/2022 0424   HGB 13.0 10/21/2022 1223   HCT 39.9 12/04/2022 0424   HCT 39.9 10/21/2022 1223   PLT 198 12/04/2022 0424   PLT 247 10/21/2022 1223   MCV 87.3 12/04/2022 0424   MCV 88 10/21/2022 1223   MCV 85 05/26/2013 2128   MCH 28.7 12/04/2022 0424   MCHC 32.8 12/04/2022 0424   RDW 14.0 12/04/2022 0424   RDW 12.9 10/21/2022 1223   RDW 14.5 05/26/2013 2128   LYMPHSABS 1.4 11/30/2022 1635   MONOABS 0.9 11/30/2022 1635   EOSABS 0.2 11/30/2022 1635   BASOSABS 0.0 11/30/2022 1635     Sleep: HST 05/25/22 - 12.5/h PAP titration 10/25/22 - AutoPAP 9 cm with EPR 2 recommended    Assessment &  Plan:   Discussion: 78 year old female former smoker with CAD, MI in 2015 and 2024, HTN, HLD, PVD, atrial fibrillation, OSA,, DM2 who presents for shortness of breath. Suspect multifactorial in setting of recent cardiac issues and deconditioning. Recent weight gain may be contributing. Unclear if she has underlying lung disease with minimal bronchitic symptoms. Will obtain PFTs and trial SABA for improvement. Pending PFT results will start maintenance inhalers.  Shortness of breath - likely multifactorial with prior cardiac history, deconditioning --START Albuterol 1-2 puffs AS NEEDED every 4-6 hours --REFER to pulmonary rehab --ORDER pulmonary function test  Health Maintenance Immunization History  Administered Date(s) Administered   Fluad Quad(high Dose 65+) 04/06/2021, 12/30/2021   Janssen (J&J) SARS-COV-2 Vaccination 05/25/2019   PFIZER(Purple Top)SARS-COV-2 Vaccination 02/21/2020   Tdap 11/04/2017   CT Lung Screen - not qualified. Insufficient tobacco history  Orders Placed This Encounter  Procedures   AMB referral to pulmonary rehabilitation    Referral Priority:   Routine    Referral Type:   Consultation    Number of Visits Requested:   1   Pulmonary function test    Standing Status:   Future    Expiration Date:   03/30/2024    Where should this test be performed?:   [facility]    Full PFT: includes the following: basic spirometry, spirometry pre & post bronchodilator, diffusion capacity (DLCO), lung volumes:   Full PFT   Meds ordered this encounter  Medications   albuterol (VENTOLIN HFA) 108 (90 Base) MCG/ACT inhaler    Sig: Inhale 2 puffs into the lungs every 6 (six) hours as needed for wheezing or shortness of breath.    Dispense:  8 g    Refill:  2    Return for after PFT, ok to schedule separate.  I have spent a total  time of 45-minutes on the day of the appointment reviewing prior documentation, coordinating care and discussing medical diagnosis and plan  with the patient/family. Imaging, labs and tests included in this note have been reviewed and interpreted independently by me.  Makenly Larabee Mechele Collin, MD [facility] 03/31/2023 3:53 PM

## 2023-04-01 NOTE — Telephone Encounter (Signed)
Received message from my scheduler that patient had been calling the office for me regarding patient assistance applications. I have advised patient to drop off what she has completed to the office, they will pass to me/our patient advocate team and we will help complete.

## 2023-04-04 ENCOUNTER — Telehealth: Payer: Self-pay | Admitting: Pharmacist

## 2023-04-04 ENCOUNTER — Other Ambulatory Visit: Payer: Self-pay | Admitting: Pharmacist

## 2023-04-04 DIAGNOSIS — N1831 Chronic kidney disease, stage 3a: Secondary | ICD-10-CM

## 2023-04-04 MED ORDER — NOVOLOG MIX 70/30 FLEXPEN (70-30) 100 UNIT/ML ~~LOC~~ SUPN: PEN_INJECTOR | SUBCUTANEOUS | 2 refills | Status: AC

## 2023-04-04 NOTE — Progress Notes (Signed)
Received call from patient to discuss medication access.   She reports that she has [employer] application that she will bring by the office to be passed to our medication access team - please fax to [place] at [phone] once received. She notes she has 1 box of Novolog 70/30 left. Order sent to local pharmacy for patient to fill while we are working on patient assistance for 2025. She has 2 boxes of Ozempic remaining.   She notes she has concerns with swelling in her feet and bruising. She was told to stop ASA per discharge instructions (STEMI 11/30/22, aspirin for 30 days, continue clopidogrel for a full 12 months while also taking Eliquis). She notes she sees her podiatrist tomorrow. I encouraged her to also call cardiology and schedule a sooner appointment.   She reports cost concerns with [facility]. Will loop in cardiology patient advocate for [employer] assistance.   Catie Eppie Gibson, PharmD, BCACP, CPP Clinical Pharmacist [facility] [phone]

## 2023-04-05 ENCOUNTER — Ambulatory Visit (INDEPENDENT_AMBULATORY_CARE_PROVIDER_SITE_OTHER): Payer: Medicare PPO

## 2023-04-05 ENCOUNTER — Encounter: Payer: Self-pay | Admitting: Podiatry

## 2023-04-05 ENCOUNTER — Ambulatory Visit: Payer: Medicare PPO | Admitting: Podiatry

## 2023-04-05 DIAGNOSIS — M1A4711 Other secondary chronic gout, right ankle and foot, with tophus (tophi): Secondary | ICD-10-CM | POA: Diagnosis not present

## 2023-04-05 DIAGNOSIS — N1831 Chronic kidney disease, stage 3a: Secondary | ICD-10-CM

## 2023-04-05 DIAGNOSIS — I739 Peripheral vascular disease, unspecified: Secondary | ICD-10-CM

## 2023-04-05 DIAGNOSIS — S90121A Contusion of right lesser toe(s) without damage to nail, initial encounter: Secondary | ICD-10-CM

## 2023-04-05 DIAGNOSIS — M79674 Pain in right toe(s): Secondary | ICD-10-CM

## 2023-04-05 DIAGNOSIS — M79675 Pain in left toe(s): Secondary | ICD-10-CM

## 2023-04-05 DIAGNOSIS — Z794 Long term (current) use of insulin: Secondary | ICD-10-CM

## 2023-04-05 DIAGNOSIS — B351 Tinea unguium: Secondary | ICD-10-CM | POA: Diagnosis not present

## 2023-04-05 DIAGNOSIS — E1122 Type 2 diabetes mellitus with diabetic chronic kidney disease: Secondary | ICD-10-CM | POA: Diagnosis not present

## 2023-04-05 NOTE — Patient Instructions (Signed)
Call [phone] to schedule your vascular testing:  [facility] [address] [place], [place] [address]

## 2023-04-06 ENCOUNTER — Encounter: Payer: Self-pay | Admitting: Podiatry

## 2023-04-06 LAB — COMPREHENSIVE METABOLIC PANEL
ALT: 9 [IU]/L (ref 0–32)
AST: 11 [IU]/L (ref 0–40)
Albumin: 4 g/dL (ref 3.8–4.8)
Alkaline Phosphatase: 109 [IU]/L (ref 44–121)
BUN/Creatinine Ratio: 17 (ref 12–28)
BUN: 19 mg/dL (ref 8–27)
Bilirubin Total: 0.4 mg/dL (ref 0.0–1.2)
CO2: 22 mmol/L (ref 20–29)
Calcium: 9.3 mg/dL (ref 8.7–10.3)
Chloride: 105 mmol/L (ref 96–106)
Creatinine, Ser: 1.12 mg/dL — ABNORMAL HIGH (ref 0.57–1.00)
Globulin, Total: 3.1 g/dL (ref 1.5–4.5)
Glucose: 63 mg/dL — ABNORMAL LOW (ref 70–99)
Potassium: 4.1 mmol/L (ref 3.5–5.2)
Sodium: 142 mmol/L (ref 134–144)
Total Protein: 7.1 g/dL (ref 6.0–8.5)
eGFR: 50 mL/min/{1.73_m2} — ABNORMAL LOW (ref >59)

## 2023-04-06 LAB — URIC ACID: Uric Acid: 7.4 mg/dL (ref 3.1–7.9)

## 2023-04-06 NOTE — Progress Notes (Signed)
  Subjective:  Patient ID: Dawn Wood, female    DOB: 1945-07-22,  MRN: [id number]  Chief Complaint  Patient presents with   Diabetes    Patient states she is diabetic, she states it looks like she has bruises on bilateral feet, she had an heart attack last year and she believe that may be why her bilateral feet are swelling up.  Patient takes tylenol for pain but it does not really work. Patient will like her toe nails cut.     78 y.o. female presents with the above complaint. History confirmed with patient.  Nails are thickened and elongated.  She has a history of gout.  Objective:  Physical Exam: warm, good capillary refill, no trophic changes or ulcerative lesions, normal monofilament exam, and normal sensory exam. Left Foot: dystrophic yellowed discolored nail plates with subungual debris Right Foot: dystrophic yellowed discolored nail plates with subungual debris and pain and swelling second toe  No images are attached to the encounter.  Radiographs: Multiple views x-ray of the right foot: Arthritic changes in the distal interphalangeal joint and erosions in this and the first metatarsophalangeal joint consistent with gouty arthropathy Assessment:   1. PVD (peripheral vascular disease) (HCC)   2. Type 2 diabetes mellitus with stage 3a chronic kidney disease, with long-term current use of insulin (HCC)   3. Contusion of second toe of right foot, initial encounter   4. Pain due to onychomycosis of toenails of both feet   5. 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  Patient educated on diabetes. Discussed proper diabetic foot care and discussed risks and complications of disease. Educated patient in depth on reasons to return to the office immediately should he/she discover anything concerning or new on the feet. All questions answered. Discussed proper shoes as well.   Does not  have any evidence of severe peripheral neuropathy.  Diabetes is well-controlled.  Does have some history of PVD and I recommended reevaluating this with ABIs as it has been a few years.  May be a contributor to her foot pain and swelling.  New ABIs ordered, if abnormal or change since last examination will refer to vascular surgery.  Discussed the etiology and treatment options for the condition in detail with the patient. Recommended debridement of the nails today. Sharp and mechanical debridement performed of all painful and mycotic nails today. Nails debrided in length and thickness using a nail nipper to level of comfort. Discussed treatment options including appropriate shoe gear. Follow up as needed for painful nails.  She is having severe pain and swelling in the second toe today we discussed possible etiologies of this, we took an x-ray to evaluate for the possibility of occult fracture and there is no indication that this is the case but there are some periarticular erosions the distal interphalangeal joint I suspect she is having gout flare and this as well I ordered her a metabolic panel and uric acid level to evaluate her current level unfortunately with her multiple comorbidities she has very limited treatment options from rheumatology for this essentially limited to colchicine as long as her renal function has not greatly changed she may take colchicine daily and for this acute flare take 2 tablets and then one 1 hour later  Return in about 3 months (around 07/04/2023) for at risk diabetic foot care.

## 2023-04-08 ENCOUNTER — Ambulatory Visit: Payer: Medicare PPO | Admitting: Rheumatology

## 2023-04-08 ENCOUNTER — Telehealth (HOSPITAL_COMMUNITY): Payer: Self-pay

## 2023-04-08 NOTE — Telephone Encounter (Signed)
Called and spoke with pt in regards to CR, pt stated she is not ready to make an appt with CR at this time. She will contact CR when she is ready.   Closed referral

## 2023-04-11 ENCOUNTER — Telehealth: Payer: Self-pay | Admitting: Neurology

## 2023-04-11 ENCOUNTER — Encounter: Payer: Medicare PPO | Admitting: Neurology

## 2023-04-11 NOTE — Telephone Encounter (Signed)
Called pt to reschedule CPAP appt with Dr. Frances Furbish. Pt states she has not used her CPAP machine and turned the machine into her DME company last week. States she still would like to be seen about how to fix her sleep, would like to know how to proceed.

## 2023-04-11 NOTE — Telephone Encounter (Signed)
Spoke with pt this AM. She states she was told to return her machine because insurance would no longer pay. She said she had trouble using it and it made her feel she was smothering. She does state that she knows she needs her sleep apnea treated. I offered an appt tomorrow but she declined because it was early AM. I instead was able to schedule her on 05/11/23 at 1:15 pm.

## 2023-04-13 ENCOUNTER — Telehealth: Payer: Self-pay

## 2023-04-13 NOTE — Telephone Encounter (Signed)
 Received patient application for 2025 PAP for Novolog  70/30 from office.  Faxed provider portion to Dr. Cleave Curling for review.

## 2023-04-14 NOTE — Telephone Encounter (Signed)
 PAP: Application for Novolog  Mix 70/30 and Ozempic  has been submitted to [employer], via fax

## 2023-04-19 NOTE — Telephone Encounter (Signed)
PAP: Patient assistance application for Novolog Mix 70/30 and Ozempic has been approved by PAP Companies: [employer] from 04/19/2023 to 03/07/2024. Medication should be delivered to PAP Delivery: Provider's office. For further shipping updates, please [employer] at [phone]. Patient ID is: waiting on approval letter

## 2023-04-19 NOTE — Progress Notes (Signed)
Pharmacy Medication Assistance Program Note    04/19/2023  Patient ID: Dawn Wood, female   DOB: 10-22-1945, 78 y.o.   MRN: [id number]     04/13/2023  Outreach Medication One  Manufacturer Medication One [employer] [facility] Novolog 70/30  Type of Sport and exercise psychologist  Date Application Sent to Prescriber 04/13/2023  Name of Prescriber Dorothyann Peng  Date Application Received From Patient 04/13/2023  Application Items Received From Patient Application  Patient Assistance Determination Approved  Approval Start Date 04/19/2023  Additional Outreach Contact Patient       04/14/2023  Outreach Medication Two  Initial Outreach Date (Medication Two) 04/14/2023  Manufacturer Medication Two [employer]  [employer] [facility] Ozempic  Dose of Ozempic 0.5mg   Type of Audiological scientist Items Requested Application  Name of Prescriber Dorothyann Peng  Date Application Received From Patient 04/13/2023  Date Application Received From Provider 04/14/2023  Date Application Submitted to Manufacturer 04/14/2023  Patient Assistance Determination Approved  Approval Start Date 04/19/2023     Signature

## 2023-04-21 ENCOUNTER — Ambulatory Visit: Payer: Medicare PPO | Admitting: Internal Medicine

## 2023-04-21 ENCOUNTER — Encounter: Payer: Self-pay | Admitting: Internal Medicine

## 2023-04-21 VITALS — BP 132/80 | HR 74 | Temp 98.4°F | Ht 65.0 in | Wt 221.0 lb

## 2023-04-21 DIAGNOSIS — I739 Peripheral vascular disease, unspecified: Secondary | ICD-10-CM

## 2023-04-21 DIAGNOSIS — N1831 Chronic kidney disease, stage 3a: Secondary | ICD-10-CM

## 2023-04-21 DIAGNOSIS — Z23 Encounter for immunization: Secondary | ICD-10-CM

## 2023-04-21 DIAGNOSIS — D6859 Other primary thrombophilia: Secondary | ICD-10-CM | POA: Diagnosis not present

## 2023-04-21 DIAGNOSIS — Z794 Long term (current) use of insulin: Secondary | ICD-10-CM

## 2023-04-21 DIAGNOSIS — I480 Paroxysmal atrial fibrillation: Secondary | ICD-10-CM | POA: Diagnosis not present

## 2023-04-21 DIAGNOSIS — H6123 Impacted cerumen, bilateral: Secondary | ICD-10-CM

## 2023-04-21 DIAGNOSIS — I1310 Hypertensive heart and chronic kidney disease without heart failure, with stage 1 through stage 4 chronic kidney disease, or unspecified chronic kidney disease: Secondary | ICD-10-CM

## 2023-04-21 DIAGNOSIS — Z0000 Encounter for general adult medical examination without abnormal findings: Secondary | ICD-10-CM | POA: Diagnosis not present

## 2023-04-21 DIAGNOSIS — E66812 Obesity, class 2: Secondary | ICD-10-CM | POA: Diagnosis not present

## 2023-04-21 DIAGNOSIS — E1151 Type 2 diabetes mellitus with diabetic peripheral angiopathy without gangrene: Secondary | ICD-10-CM

## 2023-04-21 DIAGNOSIS — E1122 Type 2 diabetes mellitus with diabetic chronic kidney disease: Secondary | ICD-10-CM

## 2023-04-21 DIAGNOSIS — Z6836 Body mass index (BMI) 36.0-36.9, adult: Secondary | ICD-10-CM

## 2023-04-21 LAB — POCT URINALYSIS DIPSTICK
Bilirubin, UA: NEGATIVE
Glucose, UA: NEGATIVE
Ketones, UA: NEGATIVE
Leukocytes, UA: NEGATIVE
Nitrite, UA: NEGATIVE
Protein, UA: POSITIVE — AB
Spec Grav, UA: >=1.03 — AB (ref 1.010–1.025)
Urobilinogen, UA: 0.2 U/dL
pH, UA: 5.5 (ref 5.0–8.0)

## 2023-04-21 NOTE — Progress Notes (Signed)
 I,Victoria T Deloria Lair, CMA,acting as a Neurosurgeon for [name], MD.,have documented all relevant documentation on the behalf of [name], MD,as directed by  [name], MD while in the presence of [name], MD.  Subjective:    Patient ID: Dawn Wood , female    DOB: [birth year] , 78 y.o.   MRN: [id number]  Chief Complaint  Patient presents with   Annual Exam   Hypertension   Diabetes    HPI  Patient presents today for annual exam. She reports compliance with medications. Denies having any headache, chest pain & sob.    Diabetes She presents for her follow-up diabetic visit. She has type 2 diabetes mellitus. Her disease course has been stable. There are no hypoglycemic associated symptoms. Pertinent negatives for diabetes include no blurred vision, no polydipsia, no polyphagia and no polyuria. There are no hypoglycemic complications. Diabetic complications include nephropathy. Risk factors for coronary artery disease include diabetes mellitus, dyslipidemia, hypertension, obesity, sedentary lifestyle and post-menopausal. Current diabetic treatment includes insulin injections. She is following a diabetic diet. She participates in exercise intermittently. Her home blood glucose trend is fluctuating minimally. Her breakfast blood glucose is taken between 8-9 am. Her breakfast blood glucose range is generally 110-130 mg/dl. Eye exam is current.  Hypertension This is a chronic problem. The current episode started more than 1 year ago. The problem has been gradually improving since onset. Pertinent negatives include no blurred vision. Risk factors for coronary artery disease include diabetes mellitus, dyslipidemia, sedentary lifestyle and post-menopausal state. Hypertensive end-organ damage includes kidney disease.     Past Medical History:  Diagnosis Date   Coronary artery disease    Diabetes mellitus without complication (HCC)    GERD (gastroesophageal reflux  disease)    Heart attack (HCC) 12/2022   Hypertension    MI (myocardial infarction) (HCC)    Peripheral vascular disease (HCC)    Sleep apnea      Family History  Problem Relation Age of Onset   Hypertension Mother    Hypertension Father    Throat cancer Father    Breast cancer Sister 53   Aortic stenosis Daughter    Heart disease Daughter        before age 84   Thyroid disease Daughter    Sleep apnea Neg Hx      Current Outpatient Medications:    Accu-Chek Softclix Lancets lancets, USE TO CHECK BLOOD SUGARS ONCE DAILY., Disp: 100 each, Rfl: 12   albuterol (VENTOLIN HFA) 108 (90 Base) MCG/ACT inhaler, Inhale 2 puffs into the lungs every 6 (six) hours as needed for wheezing or shortness of breath., Disp: 8 g, Rfl: 2   amLODipine (NORVASC) 5 MG tablet, Take 1 tablet (5 mg total) by mouth daily., Disp: 90 tablet, Rfl: 3   BD PEN NEEDLE NANO 2ND GEN 32G X 4 MM MISC, USE AS DIRECTED TWICE DAILY, Disp: 100 each, Rfl: 2   Blood Glucose Monitoring Suppl (ACCU-CHEK GUIDE) w/Device KIT, USE TO CHECK BLOOD SUGARS ONCE DAILY., Disp: 1 kit, Rfl: 0   Cholecalciferol (VITAMIN D3) 2000 units TABS, Take 1 tablet by mouth daily., Disp: , Rfl:    clopidogrel (PLAVIX) 75 MG tablet, Take 1 tablet (75 mg total) by mouth daily with breakfast., Disp: 90 tablet, Rfl: 2   colchicine 0.6 MG tablet, Take 0.6 mg by mouth as needed (for gout)., Disp: , Rfl:    Evolocumab (REPATHA SURECLICK) 140 MG/ML SOAJ, Inject 140 mg into the skin  every 14 (fourteen) days., Disp: 6 mL, Rfl: 1   fluticasone (FLONASE) 50 MCG/ACT nasal spray, Place 2 sprays into both nostrils daily., Disp: 16 g, Rfl: 2   furosemide (LASIX) 20 MG tablet, TAKE 1 TABLET(20 MG) BY MOUTH DAILY, Disp: 30 tablet, Rfl: 10   glucose blood (ACCU-CHEK GUIDE) test strip, USE TO CHECK BLOOD SUGARS ONCE DAILY., Disp: 100 each, Rfl: 12   insulin aspart protamine - aspart (NOVOLOG MIX 70/30 FLEXPEN) (70-30) 100 UNIT/ML FlexPen, INJECT 40 UNITS SUBCUTANEOUS  EVERY MORNING AND 50 UNITS SUBCUTANEOUS AT DINNER, Disp: 30 mL, Rfl: 2   isosorbide mononitrate (IMDUR) 60 MG 24 hr tablet, Take 1 tablet (60 mg total) by mouth daily. Pt takes 1 tablet by mouth daily., Disp: 30 tablet, Rfl: 7   ketorolac (ACULAR) 0.5 % ophthalmic solution, Place 1 drop into the right eye 3 (three) times daily., Disp: , Rfl:    latanoprost (XALATAN) 0.005 % ophthalmic solution, , Disp: , Rfl:    metoprolol succinate (TOPROL-XL) 100 MG 24 hr tablet, Take 1.5 tablets (150 mg total) by mouth daily. Take with or immediately following a meal., Disp: 135 tablet, Rfl: 3   nitroGLYCERIN (NITROSTAT) 0.4 MG SL tablet, Place 1 tablet (0.4 mg total) under the tongue every 5 (five) minutes as needed for chest pain., Disp: 25 tablet, Rfl: 11   olmesartan (BENICAR) 40 MG tablet, TAKE 1 TABLET(40 MG) BY MOUTH DAILY, Disp: 90 tablet, Rfl: 2   ONETOUCH VERIO test strip, USE TO CHECK BLOOD SUGARS TWICE DAILY AS DIRECTED, Disp: 100 strip, Rfl: 3   pantoprazole (PROTONIX) 40 MG tablet, Take 1 tablet (40 mg total) by mouth daily., Disp: 90 tablet, Rfl: 1   potassium chloride (KLOR-CON) 10 MEQ tablet, Take 1 tablet (10 mEq total) by mouth daily., Disp: 90 tablet, Rfl: 3   Semaglutide,0.25 or 0.5MG /DOS, (OZEMPIC, 0.25 OR 0.5 MG/DOSE,) 2 MG/1.5ML SOPN, INJECT 0.5MG  INTO SKIN ONCE A WEEK, Disp: 4.5 mL, Rfl: 3   sodium chloride (MURO 128) 5 % ophthalmic solution, Place 1 drop into the right eye 2 (two) times daily., Disp: , Rfl:    spironolactone (ALDACTONE) 25 MG tablet, Take 1 tablet (25 mg total) by mouth daily., Disp: 90 tablet, Rfl: 2   traMADol (ULTRAM) 50 MG tablet, Take 50 mg by mouth daily at 6 (six) AM., Disp: , Rfl:    VYZULTA 0.024 % SOLN, Apply 1 drop to eye at bedtime., Disp: , Rfl:    ELIQUIS 5 MG TABS tablet, TAKE 1 TABLET(5 MG) BY MOUTH TWICE DAILY, Disp: 180 tablet, Rfl: 1   Allergies  Allergen Reactions   Dust Mite Extract Other (See Comments)    REACTION: SNEEZING   Other Other (See  Comments)    ALLERGEN: DEODORIZERS SPRAY AND PERFUMES REACTION: SNEEZING   Statins Other (See Comments)    Muscle aches with rosuvastatin 5 mg daily and 3x/weekly, atorvastatin 20 mg daily and 3x/week, simvastatin 40 mg daily    Allopurinol     Diarrhea and the pt said it made her sugars low   Pollen Extract Other (See Comments)    Reaction unknown      The patient states she uses post menopausal status for birth control. No LMP recorded. Patient has had a hysterectomy.. Negative for Dysmenorrhea. Negative for: breast discharge, breast lump(s), breast pain and breast self exam. Associated symptoms include abnormal vaginal bleeding. Pertinent negatives include abnormal bleeding (hematology), anxiety, decreased libido, depression, difficulty falling sleep, dyspareunia, history of infertility, nocturia, sexual dysfunction,  sleep disturbances, urinary incontinence, urinary urgency, vaginal discharge and vaginal itching. Diet regular.The patient states her exercise level is  minimal.  . The patient's tobacco use is:  Social History   Tobacco Use  Smoking Status Former   Current packs/day: 0.00   Average packs/day: 0.3 packs/day for 45.0 years (11.3 ttl pk-yrs)   Types: Cigarettes   Start date: 01/11/1968   Quit date: 01/10/2013   Years since quitting: 10.3   Passive exposure: Never  Smokeless Tobacco Never  . She has been exposed to passive smoke. The patient's alcohol use is:  Social History   Substance and Sexual Activity  Alcohol Use Not Currently   Alcohol/week: 0.0 standard drinks of alcohol    Review of Systems  Constitutional: Negative.   HENT: Negative.    Eyes: Negative.  Negative for blurred vision.  Respiratory: Negative.    Cardiovascular: Negative.   Gastrointestinal: Negative.   Endocrine: Negative.  Negative for polydipsia, polyphagia and polyuria.  Genitourinary:  Positive for urgency. Negative for dysuria and flank pain.       She is concerned about her urine  having a strange smell. No dysuria  Musculoskeletal: Negative.   Skin: Negative.   Allergic/Immunologic: Negative.   Neurological: Negative.   Hematological:  Bruises/bleeds easily.  Psychiatric/Behavioral: Negative.       Today's Vitals   04/21/23 1125  BP: 132/80  Pulse: 74  Temp: 98.4 F (36.9 C)  SpO2: 98%  Weight: 221 lb (100.2 kg)  Height: 5\' 5"  (1.651 m)   Body mass index is 36.78 kg/m.  Wt Readings from Last 3 Encounters:  04/21/23 221 lb (100.2 kg)  03/31/23 220 lb (99.8 kg)  02/28/23 221 lb (100.2 kg)     Objective:  Physical Exam Vitals and nursing note reviewed.  Constitutional:      Appearance: Normal appearance. She is obese.  HENT:     Head: Normocephalic and atraumatic.     Right Ear: Ear canal and external ear normal. There is impacted cerumen.     Left Ear: Ear canal and external ear normal. There is impacted cerumen.  Eyes:     Extraocular Movements: Extraocular movements intact.     Conjunctiva/sclera: Conjunctivae normal.     Pupils: Pupils are equal, round, and reactive to light.  Cardiovascular:     Rate and Rhythm: Normal rate and regular rhythm.     Pulses:          Dorsalis pedis pulses are 1+ on the right side and 0 on the left side.     Heart sounds: Normal heart sounds.  Pulmonary:     Effort: Pulmonary effort is normal.     Breath sounds: Normal breath sounds.  Chest:  Breasts:    Tanner Score is 5.     Right: Normal.     Left: Normal.  Abdominal:     General: Abdomen is flat. Bowel sounds are normal.     Palpations: Abdomen is soft.  Genitourinary:    Comments: deferred Musculoskeletal:        General: Normal range of motion.     Cervical back: Normal range of motion and neck supple.  Feet:     Right foot:     Protective Sensation: 5 sites tested.  5 sites sensed.     Skin integrity: Dry skin present.     Toenail Condition: Right toenails are normal.     Left foot:     Protective Sensation: 5 sites tested.  5  sites  sensed.     Skin integrity: Dry skin present.     Toenail Condition: Left toenails are normal.  Skin:    General: Skin is warm and dry.  Neurological:     General: No focal deficit present.     Mental Status: She is alert and oriented to person, place, and time.  Psychiatric:        Mood and Affect: Mood normal.        Behavior: Behavior normal.         Assessment And Plan:     Encounter for annual physical exam Assessment & Plan: A full exam was performed.  Importance of monthly self breast exams was discussed with the patient.  She is advised to get 30-45 minutes of regular exercise, no less than four to five days per week. Both weight-bearing and aerobic exercises are recommended.  She is advised to follow a healthy diet with at least six fruits/veggies per day, decrease intake of red meat and other saturated fats and to increase fish intake to twice weekly.  Meats/fish should not be fried -- baked, boiled or broiled is preferable. It is also important to cut back on your sugar intake.  Be sure to read labels - try to avoid anything with added sugar, high fructose corn syrup or other sweeteners.  If you must use a sweetener, you can try stevia or monkfruit.  It is also important to avoid artificially sweetened foods/beverages and diet drinks. Lastly, wear SPF 50 sunscreen on exposed skin and when in direct sunlight for an extended period of time.  Be sure to avoid fast food restaurants and aim for at least 60 ounces of water daily.       Hypertensive heart and renal disease with renal failure, stage 1 through stage 4 or unspecified chronic kidney disease, without heart failure Assessment & Plan: Chronic, fair control. Goal BP<120/80.  EKG performed, NSR w/o acute changes.  She will continue with amlodipine 5mg  daily, furosemide 20mg  daily, Imdur 60mg  daily, olmesartan 40mg  daily and metoprolol XL 100mg  1-1/2 tabs daily.  She is encouraged to follow low sodium diet.    Orders: -      Lipid panel -     EKG 12-Lead -     POCT urinalysis dipstick  Paroxysmal atrial fibrillation (HCC) Assessment & Plan: Chronic, she is currently in sinus rhythm.  She is on Eliquis and rate controlled with metoprolol XL 100mg  1.5 tablets daily.     PVD (peripheral vascular disease) (HCC) Assessment & Plan: Chronic, importance of regular exercise was discussed with the patient.  She should also wear TED hose.    Type 2 diabetes mellitus with stage 3a chronic kidney disease, with long-term current use of insulin (HCC) Assessment & Plan: Chronic, diabetic foot exam was performed.  She will continue with Novolog mix 70/30 40 units qam and 50 units before dinner, semaglutide 0.5mg  weekly.  Importance of dietary and medication compliance was discussed with the patient. I DISCUSSED WITH THE PATIENT AT LENGTH REGARDING THE GOALS OF GLYCEMIC CONTROL AND POSSIBLE LONG-TERM COMPLICATIONS.  I  ALSO STRESSED THE IMPORTANCE OF COMPLIANCE WITH HOME GLUCOSE MONITORING, DIETARY RESTRICTIONS INCLUDING AVOIDANCE OF SUGARY DRINKS/PROCESSED FOODS,  ALONG WITH REGULAR EXERCISE.  I  ALSO STRESSED THE IMPORTANCE OF ANNUAL EYE EXAMS, SELF FOOT CARE AND COMPLIANCE WITH OFFICE VISITS.   Orders: -     CBC -     Lipid panel -     Hemoglobin A1c -  PTH, intact and calcium -     Phosphorus -     Protein electrophoresis, serum  Bilateral impacted cerumen Assessment & Plan: After obtaining verbal consent, both ears were flushed by irrigation. No TM abnormalities were noted. He tolerated procedure well without any complications.     Orders: -     Ear Lavage  Thrombophilia (HCC) Assessment & Plan: She will continue with Eliquis due to PAF.    Class 2 severe obesity due to excess calories with serious comorbidity and body mass index (BMI) of 36.0 to 36.9 in adult [facility]) Assessment & Plan: She is encouraged to strive for BMI less than 30 to decrease cardiac risk. Advised to aim for at least 150 minutes of  exercise per week.    Immunization due -     Flu Vaccine Trivalent High Dose (Fluad)  She is encouraged to strive for BMI less than 30 to decrease cardiac risk. Advised to aim for at least 150 minutes of exercise per week.    Return for 1 YEAR HM, 4 MONTH DM F/U.Marland Kitchen Patient was given opportunity to ask questions. Patient verbalized understanding of the plan and was able to repeat key elements of the plan. All questions were answered to their satisfaction.   I, [name], MD, have reviewed all documentation for this visit. The documentation on 04/21/23 for the exam, diagnosis, procedures, and orders are all accurate and complete.

## 2023-04-21 NOTE — Patient Instructions (Signed)

## 2023-04-22 ENCOUNTER — Telehealth: Payer: Self-pay

## 2023-04-22 ENCOUNTER — Other Ambulatory Visit (HOSPITAL_COMMUNITY): Payer: Self-pay

## 2023-04-22 NOTE — Telephone Encounter (Signed)
Patient Advocate Encounter   The patient was approved for a [employer] grant that will help cover the cost of REPATHA Total amount awarded, $2,500.  Effective: 03/23/23 - 03/21/24   [id number] [id number] [id number]   Pharmacy provided with approval and processing information. Patient informed via Dorcas Carrow, CPhT  Pharmacy Patient Advocate Specialist  Direct Number: [phone] Fax: [phone]

## 2023-04-22 NOTE — Telephone Encounter (Signed)
I am so sorry I am just seeing this. I was only signed into heartcare filters. Grant enrollment taken care of and provided processing info to pharmacy. Made pt aware via mychart.

## 2023-04-25 LAB — PROTEIN ELECTROPHORESIS, SERUM
A/G Ratio: 1 (ref 0.7–1.7)
Albumin ELP: 3.7 g/dL (ref 2.9–4.4)
Alpha 1: 0.2 g/dL (ref 0.0–0.4)
Alpha 2: 0.8 g/dL (ref 0.4–1.0)
Beta: 1.6 g/dL — ABNORMAL HIGH (ref 0.7–1.3)
Gamma Globulin: 1.1 g/dL (ref 0.4–1.8)
Globulin, Total: 3.7 g/dL (ref 2.2–3.9)
Total Protein: 7.4 g/dL (ref 6.0–8.5)

## 2023-04-25 LAB — PTH, INTACT AND CALCIUM
Calcium: 9.4 mg/dL (ref 8.7–10.3)
PTH: 37 pg/mL (ref 15–65)

## 2023-04-25 LAB — CBC
Hematocrit: 44.6 % (ref 34.0–46.6)
Hemoglobin: 13.8 g/dL (ref 11.1–15.9)
MCH: 26.8 pg (ref 26.6–33.0)
MCHC: 30.9 g/dL — ABNORMAL LOW (ref 31.5–35.7)
MCV: 87 fL (ref 79–97)
Platelets: 281 10*3/uL (ref 150–450)
RBC: 5.15 x10E6/uL (ref 3.77–5.28)
RDW: 13.8 % (ref 11.7–15.4)
WBC: 8.1 10*3/uL (ref 3.4–10.8)

## 2023-04-25 LAB — LIPID PANEL
Chol/HDL Ratio: 2.4 {ratio} (ref 0.0–4.4)
Cholesterol, Total: 127 mg/dL (ref 100–199)
HDL: 53 mg/dL (ref >39)
LDL Chol Calc (NIH): 57 mg/dL (ref 0–99)
Triglycerides: 92 mg/dL (ref 0–149)
VLDL Cholesterol Cal: 17 mg/dL (ref 5–40)

## 2023-04-25 LAB — HEMOGLOBIN A1C
Est. average glucose Bld gHb Est-mCnc: 128 mg/dL
Hgb A1c MFr Bld: 6.1 % — ABNORMAL HIGH (ref 4.8–5.6)

## 2023-04-25 LAB — PHOSPHORUS: Phosphorus: 2.7 mg/dL — ABNORMAL LOW (ref 3.0–4.3)

## 2023-04-26 ENCOUNTER — Other Ambulatory Visit (HOSPITAL_COMMUNITY): Payer: Self-pay

## 2023-04-29 ENCOUNTER — Other Ambulatory Visit: Payer: Self-pay | Admitting: Physician Assistant

## 2023-04-29 DIAGNOSIS — I480 Paroxysmal atrial fibrillation: Secondary | ICD-10-CM

## 2023-04-29 NOTE — Telephone Encounter (Signed)
Prescription refill request for Eliquis received. Indication: Afib  Last office visit: 01/12/23 Asa Lente)  Scr: 1.12 (04/05/23)  Age: 78 Weight: 100.2kg  Appropriate dose. Refill sent.

## 2023-04-30 DIAGNOSIS — H6123 Impacted cerumen, bilateral: Secondary | ICD-10-CM | POA: Insufficient documentation

## 2023-04-30 NOTE — Assessment & Plan Note (Signed)
 After obtaining verbal consent, both ears were flushed by irrigation. No TM abnormalities were noted. He tolerated procedure well without any complications.

## 2023-04-30 NOTE — Assessment & Plan Note (Signed)

## 2023-04-30 NOTE — Assessment & Plan Note (Addendum)
 Chronic, she is currently in sinus rhythm.  She is on Eliquis and rate controlled with metoprolol XL 100mg  1.5 tablets daily.

## 2023-04-30 NOTE — Assessment & Plan Note (Signed)
 Chronic, diabetic foot exam was performed.  She will continue with Novolog mix 70/30 40 units qam and 50 units before dinner, semaglutide 0.5mg  weekly.  Importance of dietary and medication compliance was discussed with the patient. I DISCUSSED WITH THE PATIENT AT LENGTH REGARDING THE GOALS OF GLYCEMIC CONTROL AND POSSIBLE LONG-TERM COMPLICATIONS.  I  ALSO STRESSED THE IMPORTANCE OF COMPLIANCE WITH HOME GLUCOSE MONITORING, DIETARY RESTRICTIONS INCLUDING AVOIDANCE OF SUGARY DRINKS/PROCESSED FOODS,  ALONG WITH REGULAR EXERCISE.  I  ALSO STRESSED THE IMPORTANCE OF ANNUAL EYE EXAMS, SELF FOOT CARE AND COMPLIANCE WITH OFFICE VISITS.

## 2023-04-30 NOTE — Assessment & Plan Note (Signed)
 She is encouraged to strive for BMI less than 30 to decrease cardiac risk. Advised to aim for at least 150 minutes of exercise per week.

## 2023-04-30 NOTE — Assessment & Plan Note (Signed)
 Chronic, importance of regular exercise was discussed with the patient.  She should also wear TED hose.

## 2023-04-30 NOTE — Assessment & Plan Note (Addendum)
 Chronic, fair control. Goal BP<120/80.  EKG performed, NSR w/o acute changes.  She will continue with amlodipine 5mg  daily, furosemide 20mg  daily, Imdur 60mg  daily, olmesartan 40mg  daily and metoprolol XL 100mg  1-1/2 tabs daily.  She is encouraged to follow low sodium diet.

## 2023-04-30 NOTE — Assessment & Plan Note (Signed)
 She will continue with Eliquis due to PAF.

## 2023-05-06 ENCOUNTER — Other Ambulatory Visit: Payer: Self-pay | Admitting: Physician Assistant

## 2023-05-06 DIAGNOSIS — I480 Paroxysmal atrial fibrillation: Secondary | ICD-10-CM

## 2023-05-06 NOTE — Telephone Encounter (Signed)
 Eliquis 5mg  refill request received. Patient is 78 years old, weight-100.2kg, Crea-1.12 on 04/05/23, Diagnosis-Afib, and last seen by Jari Favre on 01/12/23. Dose is appropriate based on dosing criteria. Will send in refill to requested pharmacy.

## 2023-05-11 ENCOUNTER — Ambulatory Visit: Payer: Medicare PPO | Admitting: Neurology

## 2023-05-11 VITALS — BP 141/80 | HR 67 | Ht 65.0 in | Wt 219.4 lb

## 2023-05-11 DIAGNOSIS — G4733 Obstructive sleep apnea (adult) (pediatric): Secondary | ICD-10-CM | POA: Diagnosis not present

## 2023-05-11 DIAGNOSIS — Z789 Other specified health status: Secondary | ICD-10-CM

## 2023-05-11 NOTE — Patient Instructions (Signed)
 Please call our office if you would like to restart CPAP therapy.  I don't believe you would be a good candidate for an oral appliance due slight underbite and gum disease, as well as several missing teeth.  Please continue to follow up with your primary care and other specialists.

## 2023-05-11 NOTE — Progress Notes (Signed)
 Subjective:    Patient ID: Dawn Wood is a 78 y.o. female.  HPI    Interim history:   Dawn Wood is a 78 year old right-handed woman with an underlying medical history of back pain, gout, coronary artery disease with history of MI, hypertension, peripheral vascular disease, reflux disease, diabetes and obesity, who presents for follow up consultation of her obstructive sleep apnea.  The patient is unaccompanied today.  She was last seen in our clinic by Margie Ege, NP on 08/26/2022, at which time she was advised to proceed with a formal titration study to help optimize her sleep apnea treatment.  Today, 05/11/2023: She reports that she is no longer on CPAP therapy and that she had to return the machine.  She had trouble tolerating CPAP.  She is not sure if she wants to get restarted on treatment but would like to think about it.  She reports that she has gum disease and missing teeth.  She is not sure that she would tolerate something in her mouth like an oral appliance as we talked about alternative options today.  She is in regular follow-up with cardiology with her history of MI.  She is scheduled for an arterial Doppler study to her lower extremities next week.  She has swelling of both lower extremities and wears compression stockings up to the knees bilaterally.  She does not have a set bedtime and rise time.  She typically sleeps in her recliner and sleeps better in the recliner then in her bed.  Rise time is generally between 6 and 6:30 AM.  She has nocturia about 2-3 times per average night.  Her Epworth sleepiness score is 14/24, fatigue severity score is 52 out of 63.   Of note, she had a CPAP titration study on 10/25/2022.  She had a sleep efficiency of only 31.2% with absence of slow-wave sleep and REM sleep and evidence of treatment emergent central apneas.  Started on CPAP therapy at a pressure of 9 cm with EPR of 2 via full facemask. Prior to that, she had a home sleep test on  05/25/2022 which showed a total AHI of 12.5/h, O2 nadir 88%.  She had a baseline sleep study on 05/18/2019 which showed mild obstructive sleep apnea with an AHI of 13/h, O2 nadir 81%. Of note, she was hospitalized in September 2024 with a STEMI.   The patient's allergies, current medications, family history, past medical history, past social history, past surgical history and problem list were reviewed and updated as appropriate.    Previously:   08/26/2022 Margie Ege, NP): <<-Reports she cannot tolerate, despite pressure reduction down to 7-11 cm water, trying nasal pillow mask, FFM.  For this reason, CPAP titration has been ordered.  Our sleep department is currently working on authorization.  The patient is motivated to pursue CPAP, she feels she stands to have very good benefit.  I have asked her to follow-up with me if she does not hear something soon about scheduling CPAP titration.  I will go ahead and schedule 89-month follow-up as a placeholder, we can push this out if we see her sooner post titration.   HISTORY OF PRESENT ILLNESS: Today 08/26/22 At last visit in May 2023 with Dr. Frances Furbish, plan to pursue CPAP.  Insurance required a new sleep study.  HST in March 2024 showed mild sleep apnea with a total AHI of 12.5/hour and O2 nadir of 88%  .  Since starting CPAP, several calls that she cannot tolerate.  An in lab sleep titration has been ordered.  Her compliance is poor. Mentions everything is going bad for her right now with her health. Told enlarged heart, needs [place] of her thyroid. Eye issues since cataract repair. Claims cannot tolerate the CPAP with the pressure, feels like she is going to suffocate. Not using CPAP at all. Started using nasal pillow mask, pressure was too much, tried full face mask made her claustrophobic. CPAP titration has been ordered but she is not approved. She knows she needs CPAP, not getting proper sleep at night. Sleeping in recliner, less headaches, less nocturia. Has  daytime fatigue, sleeps more during the day than at night. ESS 10. Has lost about 10 lbs.    >>  07/30/2021: She report worsening daytime somnolence.  She does not wake up rested.  She does not have recurrent morning headaches but has nocturia about 3-4 times per average night.  Her sleep disruption has become worse.  She is getting ready to retire.  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  She will have an insurance change because of her retirement soon.  She would be willing to consider AutoPap therapy.  She has gained a little bit of weight since we did sleep testing 2 years ago.  She often naps after she comes home from work.  She may sleep for 2 to 3 hours and then go to bed between 9 and 10 PM.  Rise time is around 3 AM, she has to be at work at 5 AM.  Her Epworth sleepiness score is 15 out of 24, fatigue severity score is 53 out of 63.  I saw her on 06/05/19, at which time we talked about her sleep study results from 05/18/2019.  Overall AHI was 13.1/h, REM AHI 30.4/h, supine AHI 17.9/h, O2 nadir 81%.  She was encouraged to consider CPAP or AutoPap therapy.  I had encouraged her to return for proper titration study.  She declined this.  She also declined starting AutoPap therapy.  We also talked about alternative treatment options.  We talked about a referral to dentistry although she had trouble tolerating partial dentures in the past and was missing several teeth.  I offered a consultation request with dentistry.  She declined a referral to dentistry.  She was working on weight loss.    I first met her on 04/02/2019 at the request of her primary care physician, at which time she reported snoring and excessive daytime somnolence.  She was advised to proceed with a sleep study.  She had a baseline sleep study on 05/18/2019.  She had a reduced percentage of REM sleep, REM latency was delayed at 251 minutes.  Overall sleep efficiency was 76.8%.  Total AHI was 13.1/h, REM AHI in the severe  range at 30.4/h, supine AHI was in the moderate range at 17.9/h, average oxygen saturation was 90%, nadir was 81%, she had no significant PLM's or EKG or EEG changes.  She was advised to consider CPAP therapy.  She was encouraged to return for a proper CPAP titration study.  She was reluctant to proceed with CPAP therapy and requested a follow-up appointment.   04/02/19: (She) reports snoring and excessive daytime somnolence.  I reviewed your office note from 03/21/2019.  Her Epworth sleepiness score is 15 out of 24, fatigue severity score is 26 out of 63.  She typically takes a nap after coming home from work.  She works from 5 AM to 2 PM typically,  is in housekeeping at [employer].  She is single, she lives alone, she has 2 grown daughters and 2 grown granddaughters, 3 great grandsons.  She has a nephew with sleep apnea.  She is reluctant to consider CPAP therapy if the need arises.  She has noticed lower extremity swelling, reports a history of gout but is also going to talk to her cardiologist about her amlodipine as advised by you.  She has significant nocturia about 3-4 times per average night but denies recurrent morning headaches.  She reports that she often falls asleep on the couch and eventually goes to bed around 9 or 10 PM, rise time is around 3:30 AM typically.  Her mother is 73 years old and she also checks on her.  Her father passed away in 1996/05/19.  She quit smoking about 8 years ago, she does not drink alcohol, she does not drink caffeine on a daily basis. She has a history of recurrent strep throat and tonsillitis, was supposed to get her tonsils out at 1982 but decided against it.   Her Past Medical History Is Significant For: Past Medical History:  Diagnosis Date   Coronary artery disease    Diabetes mellitus without complication (HCC)    GERD (gastroesophageal reflux disease)    Heart attack (HCC) 12/2022   Hypertension    MI (myocardial infarction) (HCC)    Peripheral  vascular disease (HCC)    Sleep apnea     Her Past Surgical History Is Significant For: Past Surgical History:  Procedure Laterality Date   ABDOMINAL HYSTERECTOMY  1985   APPENDECTOMY     CATARACT EXTRACTION Left 02/20/2018   Dr. Harlon Flor   CATARACT EXTRACTION Bilateral 05-20-2022   cataract surgery Right 01/25/2022   Second at [facility] 11/29   CORONARY STENT INTERVENTION N/A 11/30/2022   Procedure: CORONARY STENT INTERVENTION;  Surgeon: Swaziland, Peter M, MD;  Location: [facility] INVASIVE CV LAB;  Service: Cardiovascular;  Laterality: N/A;  LAD, RCA   CORONARY STENT PLACEMENT  05/19/2013   CORONARY ULTRASOUND/IVUS N/A 11/30/2022   Procedure: Coronary Ultrasound/IVUS;  Surgeon: Swaziland, Peter M, MD;  Location: [facility] INVASIVE CV LAB;  Service: Cardiovascular;  Laterality: N/A;  LAD, RCA   ectopic pregnancy--unilateral salpingectomy  1980   KNEE ARTHROSCOPY Left 03/2016   LEFT HEART CATH AND CORONARY ANGIOGRAPHY N/A 11/30/2022   Procedure: LEFT HEART CATH AND CORONARY ANGIOGRAPHY;  Surgeon: Swaziland, Peter M, MD;  Location: [facility] INVASIVE CV LAB;  Service: Cardiovascular;  Laterality: N/A;   unilateral oophorectomy      Her Family History Is Significant For: Family History  Problem Relation Age of Onset   Hypertension Mother    Hypertension Father    Throat cancer Father    Breast cancer Sister 40   Aortic stenosis Daughter    Heart disease Daughter        before age 48   Thyroid disease Daughter    Sleep apnea Neg Hx     Her Social History Is Significant For: Social History   Socioeconomic History   Marital status: Single    Spouse name: Not on file   Number of children: 2   Years of education: Not on file   Highest education level: Not on file  Occupational History   Not on file  Tobacco Use   Smoking status: Former    Current packs/day: 0.00    Average packs/day: 0.3 packs/day for 45.0 years (11.3 ttl pk-yrs)    Types: Cigarettes  Start date: 01/11/1968    Quit date: 01/10/2013    Years since  quitting: 10.3    Passive exposure: Never   Smokeless tobacco: Never  Vaping Use   Vaping status: Never Used  Substance and Sexual Activity   Alcohol use: Not Currently    Alcohol/week: 0.0 standard drinks of alcohol   Drug use: No   Sexual activity: Not Currently  Other Topics Concern   Not on file  Social History Narrative   Not on file   Social Drivers of Health   Financial Resource Strain: Low Risk  (10/21/2022)   Overall Financial Resource Strain (CARDIA)    Difficulty of Paying Living Expenses: Not hard at all  Food Insecurity: No Food Insecurity (12/01/2022)   Hunger Vital Sign    Worried About Running Out of Food in the Last Year: Never true    Ran Out of Food in the Last Year: Never true  Transportation Needs: No Transportation Needs (12/01/2022)   PRAPARE - Administrator, Civil Service (Medical): No    Lack of Transportation (Non-Medical): No  Physical Activity: Inactive (10/21/2022)   Exercise Vital Sign    Days of Exercise per Week: 0 days    Minutes of Exercise per Session: 0 min  Stress: No Stress Concern Present (10/21/2022)   [employer] of Occupational Health - Occupational Stress Questionnaire    Feeling of Stress : Not at all  Social Connections: Socially Isolated (10/21/2022)   Social Connection and Isolation Panel [NHANES]    Frequency of Communication with Friends and Family: More than three times a week    Frequency of Social Gatherings with Friends and Family: Twice a week    Attends Religious Services: Never    Database administrator or Organizations: No    Attends Engineer, structural: Never    Marital Status: Never married    Her Allergies Are:  Allergies  Allergen Reactions   Dust Mite Extract Other (See Comments)    REACTION: SNEEZING   Other Other (See Comments)    ALLERGEN: DEODORIZERS SPRAY AND PERFUMES REACTION: SNEEZING   Statins Other (See Comments)    Muscle aches with rosuvastatin 5 mg daily and 3x/weekly,  atorvastatin 20 mg daily and 3x/week, simvastatin 40 mg daily    Allopurinol     Diarrhea and the pt said it made her sugars low   Pollen Extract Other (See Comments)    Reaction unknown  :   Her Current Medications Are:  Outpatient Encounter Medications as of 05/11/2023  Medication Sig   Accu-Chek Softclix Lancets lancets USE TO CHECK BLOOD SUGARS ONCE DAILY.   albuterol (VENTOLIN HFA) 108 (90 Base) MCG/ACT inhaler Inhale 2 puffs into the lungs every 6 (six) hours as needed for wheezing or shortness of breath.   amLODipine (NORVASC) 5 MG tablet Take 1 tablet (5 mg total) by mouth daily.   BD PEN NEEDLE NANO 2ND GEN 32G X 4 MM MISC USE AS DIRECTED TWICE DAILY   Blood Glucose Monitoring Suppl (ACCU-CHEK GUIDE) w/Device KIT USE TO CHECK BLOOD SUGARS ONCE DAILY.   Cholecalciferol (VITAMIN D3) 2000 units TABS Take 1 tablet by mouth daily.   clopidogrel (PLAVIX) 75 MG tablet Take 1 tablet (75 mg total) by mouth daily with breakfast.   colchicine 0.6 MG tablet Take 0.6 mg by mouth as needed (for gout).   ELIQUIS 5 MG TABS tablet TAKE 1 TABLET(5 MG) BY MOUTH TWICE DAILY   Evolocumab (REPATHA SURECLICK) 140  MG/ML SOAJ Inject 140 mg into the skin every 14 (fourteen) days.   fluticasone (FLONASE) 50 MCG/ACT nasal spray Place 2 sprays into both nostrils daily.   furosemide (LASIX) 20 MG tablet TAKE 1 TABLET(20 MG) BY MOUTH DAILY   glucose blood (ACCU-CHEK GUIDE) test strip USE TO CHECK BLOOD SUGARS ONCE DAILY.   insulin aspart protamine - aspart (NOVOLOG MIX 70/30 FLEXPEN) (70-30) 100 UNIT/ML FlexPen INJECT 40 UNITS SUBCUTANEOUS EVERY MORNING AND 50 UNITS SUBCUTANEOUS AT DINNER   isosorbide mononitrate (IMDUR) 60 MG 24 hr tablet Take 1 tablet (60 mg total) by mouth daily. Pt takes 1 tablet by mouth daily.   ketorolac (ACULAR) 0.5 % ophthalmic solution Place 1 drop into the right eye 3 (three) times daily.   latanoprost (XALATAN) 0.005 % ophthalmic solution    metoprolol succinate (TOPROL-XL) 100 MG 24  hr tablet Take 1.5 tablets (150 mg total) by mouth daily. Take with or immediately following a meal.   nitroGLYCERIN (NITROSTAT) 0.4 MG SL tablet Place 1 tablet (0.4 mg total) under the tongue every 5 (five) minutes as needed for chest pain.   olmesartan (BENICAR) 40 MG tablet TAKE 1 TABLET(40 MG) BY MOUTH DAILY   ONETOUCH VERIO test strip USE TO CHECK BLOOD SUGARS TWICE DAILY AS DIRECTED   pantoprazole (PROTONIX) 40 MG tablet Take 1 tablet (40 mg total) by mouth daily.   potassium chloride (KLOR-CON) 10 MEQ tablet Take 1 tablet (10 mEq total) by mouth daily.   Semaglutide,0.25 or 0.5MG /DOS, (OZEMPIC, 0.25 OR 0.5 MG/DOSE,) 2 MG/1.5ML SOPN INJECT 0.5MG  INTO SKIN ONCE A WEEK   spironolactone (ALDACTONE) 25 MG tablet Take 1 tablet (25 mg total) by mouth daily.   traMADol (ULTRAM) 50 MG tablet Take 50 mg by mouth daily at 6 (six) AM.   VYZULTA 0.024 % SOLN Apply 1 drop to eye at bedtime.   sodium chloride (MURO 128) 5 % ophthalmic solution Place 1 drop into the right eye 2 (two) times daily. (Patient not taking: Reported on 05/11/2023)   No facility-administered encounter medications on file as of 05/11/2023.  :  Review of Systems:  Out of a complete 14 point review of systems, all are reviewed and negative with the exception of these symptoms as listed below:  Review of Systems  Neurological:        Patient in room #9 and alone. Patient states she not able to use her CPAP machine because its uncomfortable.  ESS-14 , FSS-52    Objective:  Neurological Exam  Physical Exam Physical Examination:   Vitals:   05/11/23 1314  BP: (!) 141/80  Pulse: 67   General Examination: The patient is a very pleasant 78 y.o. female in no acute distress. She appears well-developed and well-nourished and well groomed.   HEENT: Normocephalic, atraumatic, pupils are equal, round and reactive to light, extraocular tracking is good without limitation to gaze excursion or nystagmus noted. Hearing is grossly intact.  Face is symmetric with normal facial animation. Speech is clear with no dysarthria noted. There is no hypophonia. There is no lip, neck/head, jaw or voice tremor. Neck is supple with full range of passive and active motion. Airway exam reveals: mild mouth dryness, adequate dental hygiene with several missing and slight underbite, slightly swollen gums anterior lower jaw.  Moderate airway crowding.  Tongue protrudes centrally and palate elevates symmetrically.   Chest: Clear to auscultation without wheezing, rhonchi or crackles noted.   Heart: S1+S2+0, regular and normal without murmurs, rubs or gallops noted.  Abdomen: Soft, non-tender and non-distended.   Extremities: There is 1 to 2+ pitting edema in the distal lower extremities bilaterally, compression stockings up to the knees bilaterally.      Skin: Warm and dry without trophic changes noted.    Musculoskeletal: exam reveals no obvious joint deformities.    Neurologically:  Mental status: The patient is awake, alert and oriented in all 4 spheres. Her immediate and remote memory, attention, language skills and fund of knowledge are appropriate. There is no evidence of aphasia, agnosia, apraxia or anomia. Speech is clear with normal prosody and enunciation. Thought process is linear. Mood is normal and affect is normal.  Cranial nerves II - XII are as described above under HEENT exam.  Motor exam: Normal bulk, moving all 4 extremities without limitation.  There is no obvious action or resting tremor. Fine motor skills and coordination: grossly intact.  Cerebellar testing: No dysmetria or intention tremor. There is no truncal or gait ataxia.  Gait, station and balance: She stands without difficulty.  She walks without a walking aid.      Assessment and Plan:  In summary, Dawn Wood is a 78 year old right-handed woman with an underlying medical history of back pain, gout, coronary artery disease with history of MI, hypertension,  peripheral vascular disease, reflux disease, diabetes and obesity, who presents for follow up consultation of her obstructive sleep apnea.  She has a history of mild obstructive sleep apnea.  Her baseline sleep study from 05/18/2019 showed an AHI of 13.1/h, O2 nadir was 81%.  She had a home sleep test on 05/25/2022 which showed an AHI of 12.5/h, O2 nadir 88%.  She had a CPAP titration study in August 2024 but did not sleep very well.  We talked about mild obstructive sleep apnea and limited treatment options.  I do not believe she would be a good candidate for an oral appliance and she agrees that she does not wish to be considered for this.  She reports that she has gum disease.  She would like to think about CPAP therapy.  I would be happy to order another CPAP machine based on her titration study from August 2024.  For now, we will follow-up in this clinic as needed.  She is advised to follow-up with her PCP and other specialists.  I answered all her questions today and she was in agreement.   I spent 30 minutes in total face-to-face time and in reviewing records during pre-charting, more than 50% of which was spent in counseling and coordination of care, reviewing test results, reviewing medications and treatment regimen and/or in discussing or reviewing the diagnosis of OSA, the prognosis and treatment options. Pertinent laboratory and imaging test results that were available during this visit with the patient were reviewed by me and considered in my medical decision making (see chart for details).

## 2023-05-12 DIAGNOSIS — H401131 Primary open-angle glaucoma, bilateral, mild stage: Secondary | ICD-10-CM | POA: Diagnosis not present

## 2023-05-12 LAB — OPHTHALMOLOGY REPORT-SCANNED

## 2023-05-16 ENCOUNTER — Ambulatory Visit (HOSPITAL_COMMUNITY): Payer: Medicare PPO

## 2023-05-17 ENCOUNTER — Other Ambulatory Visit (HOSPITAL_COMMUNITY): Payer: Self-pay

## 2023-05-19 DIAGNOSIS — H401111 Primary open-angle glaucoma, right eye, mild stage: Secondary | ICD-10-CM | POA: Diagnosis not present

## 2023-05-23 DIAGNOSIS — N1831 Chronic kidney disease, stage 3a: Secondary | ICD-10-CM | POA: Diagnosis not present

## 2023-05-24 ENCOUNTER — Other Ambulatory Visit: Payer: Self-pay | Admitting: Internal Medicine

## 2023-05-24 ENCOUNTER — Ambulatory Visit (HOSPITAL_COMMUNITY)
Admission: RE | Admit: 2023-05-24 | Discharge: 2023-05-24 | Disposition: A | Discharge status: Home / Self Care | Source: Ambulatory Visit | Attending: Podiatry | Admitting: Podiatry

## 2023-05-24 ENCOUNTER — Ambulatory Visit (HOSPITAL_BASED_OUTPATIENT_CLINIC_OR_DEPARTMENT_OTHER): Payer: Medicare PPO | Admitting: Pulmonary Disease

## 2023-05-24 ENCOUNTER — Encounter (HOSPITAL_BASED_OUTPATIENT_CLINIC_OR_DEPARTMENT_OTHER): Payer: Medicare PPO

## 2023-05-24 DIAGNOSIS — N1831 Chronic kidney disease, stage 3a: Secondary | ICD-10-CM | POA: Diagnosis not present

## 2023-05-24 DIAGNOSIS — I739 Peripheral vascular disease, unspecified: Secondary | ICD-10-CM | POA: Insufficient documentation

## 2023-05-24 DIAGNOSIS — E1122 Type 2 diabetes mellitus with diabetic chronic kidney disease: Secondary | ICD-10-CM | POA: Insufficient documentation

## 2023-05-24 DIAGNOSIS — Z794 Long term (current) use of insulin: Secondary | ICD-10-CM | POA: Diagnosis not present

## 2023-05-24 LAB — VAS US PAD ABI
Left ABI: 0.76
Right ABI: 0.96

## 2023-05-25 ENCOUNTER — Ambulatory Visit (HOSPITAL_BASED_OUTPATIENT_CLINIC_OR_DEPARTMENT_OTHER): Payer: Medicare PPO | Admitting: Pulmonary Disease

## 2023-05-25 ENCOUNTER — Other Ambulatory Visit (HOSPITAL_BASED_OUTPATIENT_CLINIC_OR_DEPARTMENT_OTHER): Payer: Self-pay

## 2023-05-25 ENCOUNTER — Encounter (HOSPITAL_BASED_OUTPATIENT_CLINIC_OR_DEPARTMENT_OTHER): Payer: Self-pay | Admitting: Pulmonary Disease

## 2023-05-25 VITALS — BP 124/80 | HR 73 | Ht 64.5 in | Wt 218.6 lb

## 2023-05-25 DIAGNOSIS — R062 Wheezing: Secondary | ICD-10-CM

## 2023-05-25 DIAGNOSIS — J42 Unspecified chronic bronchitis: Secondary | ICD-10-CM

## 2023-05-25 DIAGNOSIS — J4530 Mild persistent asthma, uncomplicated: Secondary | ICD-10-CM | POA: Diagnosis not present

## 2023-05-25 LAB — PULMONARY FUNCTION TEST
DL/VA % pred: 109 %
DL/VA: 4.45 ml/min/mmHg/L
DLCO cor % pred: 70 %
DLCO cor: 13.49 ml/min/mmHg
DLCO unc % pred: 70 %
DLCO unc: 13.65 ml/min/mmHg
FEF 25-75 Post: 2.69 L/s
FEF 25-75 Pre: 1.17 L/s
FEF2575-%Change-Post: 130 %
FEF2575-%Pred-Post: 174 %
FEF2575-%Pred-Pre: 75 %
FEV1-%Change-Post: 24 %
FEV1-%Pred-Post: 68 %
FEV1-%Pred-Pre: 55 %
FEV1-Post: 1.41 L
FEV1-Pre: 1.13 L
FEV1FVC-%Change-Post: 0 %
FEV1FVC-%Pred-Pre: 112 %
FEV6-%Change-Post: 24 %
FEV6-%Pred-Post: 64 %
FEV6-%Pred-Pre: 51 %
FEV6-Post: 1.69 L
FEV6-Pre: 1.35 L
FEV6FVC-%Pred-Post: 105 %
FEV6FVC-%Pred-Pre: 105 %
FVC-%Change-Post: 24 %
FVC-%Pred-Post: 61 %
FVC-%Pred-Pre: 49 %
FVC-Post: 1.69 L
FVC-Pre: 1.35 L
Post FEV1/FVC ratio: 83 %
Post FEV6/FVC ratio: 100 %
Pre FEV1/FVC ratio: 84 %
Pre FEV6/FVC Ratio: 100 %
RV % pred: 92 %
RV: 2.19 L
TLC % pred: 85 %
TLC: 4.4 L

## 2023-05-25 MED ORDER — BUDESONIDE-FORMOTEROL FUMARATE 160-4.5 MCG/ACT IN AERO: 2.0000 | INHALATION_SPRAY | Freq: Two times a day (BID) | RESPIRATORY_TRACT | 11 refills | Status: DC

## 2023-05-25 NOTE — Progress Notes (Signed)
 Subjective:   PATIENT ID: Dawn Wood GENDER: female DOB: 07-28-1945, MRN: [id number]  Chief Complaint  Patient presents with   Follow-up    PFTs, Wheezing    Reason for Visit: Follow-up shortness of breath  [name] is a 78 year old female former smoker with CAD, MI in 2015 and 2024, HTN, HLD, PVD, atrial fibrillation, OSA,, DM2 who presents for follow-up  Initial consult She has had a year of shortness of breath with exertion and noticeable worsening after her heart attack in September three months ago where she had stent placed in pLAD. Associated with coughing and wheezing. Not necessarily with activity. Denies preceding illness. Denies childhood asthma and mother with asthma. Since her retirement in 08/2021 she is less active. Has shortness of breath with moving trash can. She has never been on maintenance or rescue inhalers.  05/25/23 Since our last visit she has been using the rescue inhaler with a little improvement. She continues to have shortness of breath daily and wheezing with activity. Feels limited in what she can do due to her symptoms.   Social History: Former smoker. Quit 8 years. 1/2 ppd x 45 years Previously worked intermittently in the [employer] with exposure to chemicals  Past Medical History:  Diagnosis Date   Coronary artery disease    Diabetes mellitus without complication (HCC)    GERD (gastroesophageal reflux disease)    Heart attack (HCC) 12/2022   Hypertension    MI (myocardial infarction) (HCC)    Peripheral vascular disease (HCC)    Sleep apnea      Family History  Problem Relation Age of Onset   Hypertension Mother    Hypertension Father    Throat cancer Father    Breast cancer Sister 37   Aortic stenosis Daughter    Heart disease Daughter        before age 15   Thyroid disease Daughter    Sleep apnea Neg Hx      Social History   Occupational History   Not on file  Tobacco Use    Smoking status: Former    Current packs/day: 0.00    Average packs/day: 0.3 packs/day for 45.0 years (11.3 ttl pk-yrs)    Types: Cigarettes    Start date: 01/11/1968    Quit date: 01/10/2013    Years since quitting: 10.3    Passive exposure: Never   Smokeless tobacco: Never  Vaping Use   Vaping status: Never Used  Substance and Sexual Activity   Alcohol use: Not Currently    Alcohol/week: 0.0 standard drinks of alcohol   Drug use: No   Sexual activity: Not Currently    Allergies  Allergen Reactions   Dust Mite Extract Other (See Comments)    REACTION: SNEEZING   Other Other (See Comments)    ALLERGEN: DEODORIZERS SPRAY AND PERFUMES REACTION: SNEEZING   Statins Other (See Comments)    Muscle aches with rosuvastatin 5 mg daily and 3x/weekly, atorvastatin 20 mg daily and 3x/week, simvastatin 40 mg daily    Allopurinol     Diarrhea and the pt said it made her sugars low   Pollen Extract Other (See Comments)    Reaction unknown     Outpatient Medications Prior to Visit  Medication Sig Dispense Refill   Accu-Chek Softclix Lancets lancets USE TO CHECK BLOOD SUGARS ONCE DAILY. 100 each 12   albuterol (VENTOLIN HFA) 108 (90 Base) MCG/ACT inhaler Inhale 2 puffs into the lungs every  6 (six) hours as needed for wheezing or shortness of breath. 8 g 2   amLODipine (NORVASC) 5 MG tablet Take 1 tablet (5 mg total) by mouth daily. 90 tablet 3   BD PEN NEEDLE NANO 2ND GEN 32G X 4 MM MISC USE AS DIRECTED TWICE DAILY 100 each 2   Blood Glucose Monitoring Suppl (ACCU-CHEK GUIDE) w/Device KIT USE TO CHECK BLOOD SUGARS ONCE DAILY. 1 kit 0   Cholecalciferol (VITAMIN D3) 2000 units TABS Take 1 tablet by mouth daily.     clopidogrel (PLAVIX) 75 MG tablet Take 1 tablet (75 mg total) by mouth daily with breakfast. 90 tablet 2   colchicine 0.6 MG tablet Take 0.6 mg by mouth as needed (for gout).     ELIQUIS 5 MG TABS tablet TAKE 1 TABLET(5 MG) BY MOUTH TWICE DAILY 180 tablet 1   Evolocumab (REPATHA  SURECLICK) 140 MG/ML SOAJ Inject 140 mg into the skin every 14 (fourteen) days. 6 mL 1   fluticasone (FLONASE) 50 MCG/ACT nasal spray Place 2 sprays into both nostrils daily. 16 g 2   furosemide (LASIX) 20 MG tablet TAKE 1 TABLET(20 MG) BY MOUTH DAILY 30 tablet 10   glucose blood (ACCU-CHEK GUIDE) test strip USE TO CHECK BLOOD SUGARS ONCE DAILY. 100 each 12   insulin aspart protamine - aspart (NOVOLOG MIX 70/30 FLEXPEN) (70-30) 100 UNIT/ML FlexPen INJECT 40 UNITS SUBCUTANEOUS EVERY MORNING AND 50 UNITS SUBCUTANEOUS AT DINNER 30 mL 2   isosorbide mononitrate (IMDUR) 60 MG 24 hr tablet Take 1 tablet (60 mg total) by mouth daily. Pt takes 1 tablet by mouth daily. 30 tablet 7   ketorolac (ACULAR) 0.5 % ophthalmic solution Place 1 drop into the right eye 3 (three) times daily.     latanoprost (XALATAN) 0.005 % ophthalmic solution      metoprolol succinate (TOPROL-XL) 100 MG 24 hr tablet Take 1.5 tablets (150 mg total) by mouth daily. Take with or immediately following a meal. 135 tablet 3   nitroGLYCERIN (NITROSTAT) 0.4 MG SL tablet Place 1 tablet (0.4 mg total) under the tongue every 5 (five) minutes as needed for chest pain. 25 tablet 11   olmesartan (BENICAR) 40 MG tablet TAKE 1 TABLET(40 MG) BY MOUTH DAILY 90 tablet 2   ONETOUCH VERIO test strip USE TO CHECK BLOOD SUGARS TWICE DAILY AS DIRECTED 100 strip 3   pantoprazole (PROTONIX) 40 MG tablet Take 1 tablet (40 mg total) by mouth daily. 90 tablet 1   potassium chloride (KLOR-CON) 10 MEQ tablet Take 1 tablet (10 mEq total) by mouth daily. 90 tablet 3   Semaglutide,0.25 or 0.5MG /DOS, (OZEMPIC, 0.25 OR 0.5 MG/DOSE,) 2 MG/1.5ML SOPN INJECT 0.5MG  INTO SKIN ONCE A WEEK 4.5 mL 3   sodium chloride (MURO 128) 5 % ophthalmic solution Place 1 drop into the right eye 2 (two) times daily.     spironolactone (ALDACTONE) 25 MG tablet Take 1 tablet (25 mg total) by mouth daily. 90 tablet 2   traMADol (ULTRAM) 50 MG tablet Take 50 mg by mouth daily at 6 (six) AM.      VYZULTA 0.024 % SOLN Apply 1 drop to eye at bedtime.     No facility-administered medications prior to visit.    Review of Systems  Constitutional:  Negative for chills, diaphoresis, fever, malaise/fatigue and weight loss.  HENT:  Negative for congestion.   Respiratory:  Positive for shortness of breath and wheezing. Negative for cough, hemoptysis and sputum production.   Cardiovascular:  Negative for  chest pain, palpitations and leg swelling.     Objective:   Vitals:   05/25/23 1535  BP: 124/80  Pulse: 73  SpO2: 97%  Weight: 218 lb 9.6 oz (99.2 kg)  Height: 5' 4.5" (1.638 m)    SpO2: 97 % Physical Exam: General: Well-appearing, no acute distress HENT: [place], AT Eyes: EOMI, no scleral icterus Respiratory: Clear to auscultation bilaterally.  No crackles, wheezing or rales Cardiovascular: RRR, -M/R/G, no JVD Extremities:-Edema,-tenderness Neuro: AAO x4, CNII-XII grossly intact Psych: Normal mood, normal affect  Data Reviewed:  Imaging: CT Chest 08/13/22 - Mild peripheral subpleural reticulation at lung bases. Parenchymal banding identified within lingular and lung bases  PFT: 05/25/23 FVC 1.69 (61%) FEV1 1.41 (68%) Ratio 83  TLC 85% DLCO 70%. Significant bronchodilator response Interpretation: No obstructive defect or restrictive defect. Reduced FVC and FEV1. Significant bronchodilator response suggestive of reversible obstructive disease such as asthma. Mildly reduced DLCO  Labs: CBC    Component Value Date/Time   WBC 8.1 04/21/2023 1226   WBC 9.3 12/04/2022 0424   RBC 5.15 04/21/2023 1226   RBC 4.57 12/04/2022 0424   HGB 13.8 04/21/2023 1226   HCT 44.6 04/21/2023 1226   PLT 281 04/21/2023 1226   MCV 87 04/21/2023 1226   MCV 85 05/26/2013 2128   MCH 26.8 04/21/2023 1226   MCH 28.7 12/04/2022 0424   MCHC 30.9 (L) 04/21/2023 1226   MCHC 32.8 12/04/2022 0424   RDW 13.8 04/21/2023 1226   RDW 14.5 05/26/2013 2128   LYMPHSABS 1.4 11/30/2022 1635   MONOABS 0.9  11/30/2022 1635   EOSABS 0.2 11/30/2022 1635   BASOSABS 0.0 11/30/2022 1635     Sleep: HST 05/25/22 - 12.5/h PAP titration 10/25/22 - AutoPAP 9 cm with EPR 2 recommended    Assessment & Plan:   Discussion: 78 year old female former smoker with CAD, MI in 2015 and 2024, HTN, HLD, PVD, atrial fibrillation, OSA,, DM2 who presents for follow-up shortness of breath. Suspect multifactorial in setting of recent cardiac issues and deconditioning. Recent weight gain may be contributing. Unclear if she has underlying lung disease with minimal bronchitic symptoms.   Reviewed PFT results. Consistent with asthma. Pharmacy investigation confirmed brand name symbicort is the lowest cost $40. Discussed clinical course and management of asthma including bronchodilator regimen, preventive care including vaccinations and action plan for exacerbation.  Mild persistent asthma --Reviewed pulmonary function tests. Consistent with asthma --START brand name Symbicort 160-4.5 TWO puffs in the morning and evening. Rinse mouth out after use --CONTINUE Albuterol 1-2 puffs AS NEEDED every 4-6 hours  Health Maintenance Immunization History  Administered Date(s) Administered   Fluad Quad(high Dose 65+) 04/06/2021, 12/30/2021   Fluad Trivalent(High Dose 65+) 04/21/2023   Janssen (J&J) SARS-COV-2 Vaccination 05/25/2019   PFIZER(Purple Top)SARS-COV-2 Vaccination 02/21/2020   Tdap 11/04/2017   CT Lung Screen - not qualified. Insufficient tobacco history  No orders of the defined types were placed in this encounter.  Meds ordered this encounter  Medications   budesonide-formoterol (SYMBICORT) 160-4.5 MCG/ACT inhaler    Sig: Inhale 2 puffs into the lungs 2 (two) times daily.    Dispense:  10.2 g    Refill:  11    Brand name only    Return in about 3 months (around 08/25/2023) for with NP.  I have spent a total time of 35-minutes on the day of the appointment including chart review, data review, collecting  history, coordinating care and discussing medical diagnosis and plan with the patient/family. Past  medical history, allergies, medications were reviewed. Pertinent imaging, labs and tests included in this note have been reviewed and interpreted independently by me.  Devory Mckinzie Mechele Collin, MD [facility] 05/25/2023 4:12 PM

## 2023-05-25 NOTE — Progress Notes (Signed)
 Full PFT Performed Today

## 2023-05-25 NOTE — Patient Instructions (Signed)
 Mild persistent asthma --Reviewed pulmonary function tests. Consistent with asthma --START brand name Symbicort 160-4.5 TWO puffs in the morning and evening. Rinse mouth out after use --CONTINUE Albuterol 1-2 puffs AS NEEDED every 4-6 hours --Encourage participating in cardiac rehab

## 2023-05-25 NOTE — Patient Instructions (Signed)
 Full PFT Performed Today

## 2023-05-26 ENCOUNTER — Encounter: Payer: Self-pay | Admitting: Internal Medicine

## 2023-05-26 ENCOUNTER — Ambulatory Visit: Payer: Medicare PPO | Attending: Internal Medicine | Admitting: Internal Medicine

## 2023-05-26 VITALS — BP 132/68 | HR 74 | Ht 64.5 in | Wt 219.0 lb

## 2023-05-26 DIAGNOSIS — I10 Essential (primary) hypertension: Secondary | ICD-10-CM | POA: Diagnosis not present

## 2023-05-26 DIAGNOSIS — I480 Paroxysmal atrial fibrillation: Secondary | ICD-10-CM

## 2023-05-26 DIAGNOSIS — I2109 ST elevation (STEMI) myocardial infarction involving other coronary artery of anterior wall: Secondary | ICD-10-CM

## 2023-05-26 NOTE — Patient Instructions (Addendum)
 Medication Instructions:  STOP TAKING Clopidogrel (PLAVIX)   *If you need a refill on your cardiac medications before your next appointment, please call your pharmacy*  Follow-Up: At [facility], you and your health needs are our priority.  As part of our continuing mission to provide you with exceptional heart care, we have created designated Provider Care Teams.  These Care Teams include your primary Cardiologist (physician) and Advanced Practice Providers (APPs -  Physician Assistants and Nurse Practitioners) who all work together to provide you with the care you need, when you need it.   Your next appointment:   6 month(s)  Provider:   Orpha Bur NP  Other Instructions

## 2023-05-26 NOTE — Progress Notes (Signed)
 Cardiology Office Note    Patient Name: Dawn Wood Date of Encounter: 05/26/2023  Primary Care Provider:  Dorothyann Peng, MD Primary Cardiologist:  Orbie Pyo, MD Primary Electrophysiologist: None   Past Medical History    Past Medical History:  Diagnosis Date   Coronary artery disease    Diabetes mellitus without complication (HCC)    GERD (gastroesophageal reflux disease)    Heart attack (HCC) 12/2022   Hypertension    MI (myocardial infarction) (HCC)    Peripheral vascular disease (HCC)    Sleep apnea     History of Present Illness  Dawn Wood is a 78 year old female with a PMH of CAD s/p NSTEMI treated with PCI/DES to LAD with 60% RCA and 40% LCx, HTN, HLD, PVD, DM type II, PAF (on Eliquis), arthritis who presents today for post PCI follow-up.  Dawn Wood was last seen on 11/25/2022 follow-up recent episode of chest pain.  During visit patient reported poor compliance with Imdur and ongoing discomfort of chest pain.  Imdur was increased to 60 mg daily and recommendation was made to repeat Lexiscan if pain increased with Imdur.  She developed sharp midsternal pain while at a car dealership and was taken to the ED and found to have an anterior STEMI.  She was taken emergently to the Cath Lab and LHC was completed with successful PCI of proximal LAD and attempted PCI of mid RCA but unable to cross wire however balloon angioplasty was completed with minimal flow.  Patient could have CTO of the RCA if refractory persist.  He was discharged on triple therapy with Plavix 75 mg, ASA 81 mg and Eliquis 5 mg twice daily for 1 month.  She will have ASA 81 mg discontinued and 1 month.  2D echo was also completed with EF of 60 to 65% severe LVH with grade 1 DD, trivial MVR.   During today's visit the patient reports that she is currently not experiencing any chest pain following her latest heart catheterization.  She does endorse increased shortness of breath with  activity and is unfortunately volume up on examination today.  She reports indiscretions with salt in her diet and has not been drinking an excessive amount of water.  Her blood pressure today is also elevated at 152/63 and was 150/74 on recheck.  She reports compliance with her current medications and denies any adverse reactions.  During today's visit we discussed her latest heart catheterization and patient had all questions answered to her satisfaction.  She is interested in participating in cardiac rehab and will be provided clearance following today's visit.  Patient denies chest pain, palpitations, dyspnea, PND, orthopnea, nausea, vomiting, dizziness, syncope, edema, weight gain, or early satiety.    Interval Hx 05/26/2023 I have not seen this patient, she was seen by Dr. Lynnette Caffey in the hospital in September and has subsequently been seen by Robin Searing.  She was seen by Dr. Katrinka Blazing and Shari Prows prior to this.  She has history of coronary disease per above and she comes in for follow-up.  She was plan for cardiac rehab post her left heart cath in September, however she has not started this quite yet.  She is ready to stop her Plavix with increased bruising.  She notes she recently was diagnosed with asthma and has started albuterol   Review of Systems  Please see the history of present illness.    All other systems reviewed and are otherwise negative except as noted above.  Physical Exam    Wt Readings from Last 3 Encounters:  05/26/23 219 lb (99.3 kg)  05/25/23 218 lb 9.6 oz (99.2 kg)  05/25/23 218 lb 9.6 oz (99.2 kg)   VS: Vitals:   05/26/23 1417  BP: 132/68  Pulse: 74  SpO2: 95%    GEN: Well nourished, well developed in no acute distress Neck: No JVD; No carotid bruits Pulmonary: Clear to auscultation without rales, wheezing or rhonchi  Cardiovascular: Normal rate. Regular rhythm. Normal S1. Normal S2.   Murmurs: There is no murmur.  ABDOMEN: Soft, non-tender,  non-distended EXTREMITIES:  No edema; No deformity   EKG/LABS/ Recent Cardiac Studies   ECG personally reviewed by me today -none completed today  Risk Assessment/Calculations:     CHA2DS2-VASc Score = 6   This indicates a 9.7% annual risk of stroke. The patient's score is based upon: CHF History: 0 HTN History: 1 Diabetes History: 1 Stroke History: 0 Vascular Disease History: 1 Age Score: 2 Gender Score: 1         Lab Results  Component Value Date   WBC 8.1 04/21/2023   HGB 13.8 04/21/2023   HCT 44.6 04/21/2023   MCV 87 04/21/2023   PLT 281 04/21/2023   Lab Results  Component Value Date   CREATININE 1.12 (H) 04/05/2023   BUN 19 04/05/2023   NA 142 04/05/2023   K 4.1 04/05/2023   CL 105 04/05/2023   CO2 22 04/05/2023   Lab Results  Component Value Date   CHOL 127 04/21/2023   HDL 53 04/21/2023   LDLCALC 57 04/21/2023   LDLDIRECT 83 06/11/2019   TRIG 92 04/21/2023   CHOLHDL 2.4 04/21/2023    Lab Results  Component Value Date   HGBA1C 6.1 (H) 04/21/2023   Assessment & Plan    Coronary artery disease: -s/p NSTEMI 2015 treated with DES to LAD  -s/p anterior STEMI 11/30/2022 with PCI to proximal LAD and angioplasty to mid RCA due to inability to cross lesion with wire -She denies chest pain -on eliquis, can stop plavix 75 mg s/p 6 moths  -Continue Repatha 140 mg q. 14 days, Toprol-XL 150 mg daily, Imdur 60 mg daily - pending cardiac rehab  Essential hypertension: HTN heart disease severe LVH - Good control today, continue current antihypertensives -Patient advised to avoid excess salt and follow a Dash type diet.  Paroxysmal AF: - Has been in sinus rhythm  -Continue Eliquis 5 mg twice daily  OSA -having trouble with cpap - sleeps in recliner   ?Dependant edema She had prior lower extremity edema she had a negative BNP. -compression stockings.  -Continue 20 mg lasix   5.  IDDM type II: -A1c at goal <7       Disposition: Follow up with  Alden Server in 6 months    Signed, Maisie Fus,  05/26/2023, 2:30 PM [facility]

## 2023-05-27 ENCOUNTER — Other Ambulatory Visit (HOSPITAL_BASED_OUTPATIENT_CLINIC_OR_DEPARTMENT_OTHER): Payer: Self-pay

## 2023-05-27 ENCOUNTER — Telehealth (HOSPITAL_BASED_OUTPATIENT_CLINIC_OR_DEPARTMENT_OTHER): Payer: Self-pay | Admitting: Pulmonary Disease

## 2023-05-27 NOTE — Telephone Encounter (Signed)
 Called patient's pharmacy. Re-ran prescription and confirmed brand name Symbicort $40.   We also discussed risk and benefits of not treating her asthma. We discussed side effect profile. After discussion she agreed to start medication.

## 2023-05-27 NOTE — Telephone Encounter (Signed)
 Patient states was told to call back after her 05/25/23 appointment if patients part was over $40 when picking up Symbicort. Patients part is $212 and cannot afford that price. Any way for her to have a cheaper option?  Patient also voiced concerns if this is a safe option for her since she read the side effects of causing heart issues. Patient had a heart attack within the last year and has a stent placed. Wants to be sure this is still a safe inhaler option for her.   Routing to clinic staff before routing to pharmacy team due to patient concerns.  Please advise and call patient back if needed

## 2023-05-30 ENCOUNTER — Encounter: Payer: Self-pay | Admitting: Podiatry

## 2023-05-31 ENCOUNTER — Telehealth: Payer: Self-pay

## 2023-05-31 NOTE — Telephone Encounter (Signed)
 Called patient to inform patient assistance is ready for pick up, patient aware.

## 2023-06-02 ENCOUNTER — Telehealth: Payer: Self-pay

## 2023-06-02 DIAGNOSIS — D631 Anemia in chronic kidney disease: Secondary | ICD-10-CM | POA: Diagnosis not present

## 2023-06-02 DIAGNOSIS — N1831 Chronic kidney disease, stage 3a: Secondary | ICD-10-CM | POA: Diagnosis not present

## 2023-06-02 DIAGNOSIS — N2581 Secondary hyperparathyroidism of renal origin: Secondary | ICD-10-CM | POA: Diagnosis not present

## 2023-06-02 DIAGNOSIS — I129 Hypertensive chronic kidney disease with stage 1 through stage 4 chronic kidney disease, or unspecified chronic kidney disease: Secondary | ICD-10-CM | POA: Diagnosis not present

## 2023-06-02 NOTE — Telephone Encounter (Signed)
 Patient p/u patient assistance medication   3.27.25 @3pm    8 boxes  [employer] Lot # [id number] EXP DATE: 12/05/24 NDC # [id number]

## 2023-06-06 ENCOUNTER — Other Ambulatory Visit: Payer: Self-pay | Admitting: Physician Assistant

## 2023-06-07 ENCOUNTER — Telehealth: Payer: Self-pay | Admitting: Internal Medicine

## 2023-06-07 ENCOUNTER — Other Ambulatory Visit: Payer: Self-pay

## 2023-06-07 MED ORDER — COLCHICINE 0.6 MG PO TABS: 0.6000 mg | ORAL_TABLET | ORAL | 1 refills | Status: DC | PRN

## 2023-06-07 NOTE — Telephone Encounter (Signed)
 Called pt to inform her that she needed to contact her PCP to get a refill on her medication colchicine, because it is not a cardiac medication and Dr. Lynnette Caffey did not prescribe this medication. Pt verbalized understanding.

## 2023-06-07 NOTE — Telephone Encounter (Signed)
*  STAT* If patient is at the pharmacy, call can be transferred to refill team.   1. Which medications need to be refilled? (please list name of each medication and dose if known) colchicine 0.6 MG tablet   2. Which pharmacy/location (including street and city if local pharmacy) is medication to be sent to?  [id number] - [place], [place] - [address] AT [address]    3. Do they need a 30 day or 90 day supply? 90

## 2023-06-13 ENCOUNTER — Other Ambulatory Visit: Payer: Self-pay | Admitting: Internal Medicine

## 2023-06-13 DIAGNOSIS — Z1231 Encounter for screening mammogram for malignant neoplasm of breast: Secondary | ICD-10-CM

## 2023-06-14 ENCOUNTER — Telehealth (HOSPITAL_COMMUNITY): Payer: Self-pay

## 2023-06-14 NOTE — Telephone Encounter (Signed)
 Patient called asking if her referral was still good. Informed patient we would be able to schedule her for cardiac rehab, but she would not be able to attend pulmonary until CR is complete. Went over program details.  Rocio commented that 3x days a week was too much, and she was offered the ability to come 2x days a week. She did not want to come for 2 hours a day and it was explained that she could just come to exercise, which is around an hour a day.   Patient now unsure she wants to come, stated she would call 'later in the week' to schedule. Will check insurance when she calls to get scheduled.

## 2023-06-15 ENCOUNTER — Observation Stay (HOSPITAL_COMMUNITY)
Admission: EM | Admit: 2023-06-15 | Discharge: 2023-06-16 | Disposition: A | Discharge status: Home / Self Care | Attending: Family Medicine | Admitting: Family Medicine

## 2023-06-15 ENCOUNTER — Other Ambulatory Visit: Payer: Self-pay

## 2023-06-15 DIAGNOSIS — G4733 Obstructive sleep apnea (adult) (pediatric): Secondary | ICD-10-CM | POA: Diagnosis not present

## 2023-06-15 DIAGNOSIS — E1122 Type 2 diabetes mellitus with diabetic chronic kidney disease: Secondary | ICD-10-CM | POA: Insufficient documentation

## 2023-06-15 DIAGNOSIS — I739 Peripheral vascular disease, unspecified: Secondary | ICD-10-CM | POA: Insufficient documentation

## 2023-06-15 DIAGNOSIS — J810 Acute pulmonary edema: Secondary | ICD-10-CM | POA: Insufficient documentation

## 2023-06-15 DIAGNOSIS — E785 Hyperlipidemia, unspecified: Secondary | ICD-10-CM | POA: Diagnosis not present

## 2023-06-15 DIAGNOSIS — I130 Hypertensive heart and chronic kidney disease with heart failure and stage 1 through stage 4 chronic kidney disease, or unspecified chronic kidney disease: Secondary | ICD-10-CM | POA: Diagnosis not present

## 2023-06-15 DIAGNOSIS — R0989 Other specified symptoms and signs involving the circulatory and respiratory systems: Secondary | ICD-10-CM | POA: Diagnosis not present

## 2023-06-15 DIAGNOSIS — I4892 Unspecified atrial flutter: Secondary | ICD-10-CM | POA: Diagnosis not present

## 2023-06-15 DIAGNOSIS — Z79899 Other long term (current) drug therapy: Secondary | ICD-10-CM | POA: Diagnosis not present

## 2023-06-15 DIAGNOSIS — Z87891 Personal history of nicotine dependence: Secondary | ICD-10-CM | POA: Insufficient documentation

## 2023-06-15 DIAGNOSIS — I10 Essential (primary) hypertension: Secondary | ICD-10-CM | POA: Diagnosis present

## 2023-06-15 DIAGNOSIS — E876 Hypokalemia: Secondary | ICD-10-CM | POA: Insufficient documentation

## 2023-06-15 DIAGNOSIS — R079 Chest pain, unspecified: Secondary | ICD-10-CM | POA: Diagnosis not present

## 2023-06-15 DIAGNOSIS — Z6837 Body mass index (BMI) 37.0-37.9, adult: Secondary | ICD-10-CM | POA: Diagnosis not present

## 2023-06-15 DIAGNOSIS — Z794 Long term (current) use of insulin: Secondary | ICD-10-CM | POA: Diagnosis not present

## 2023-06-15 DIAGNOSIS — I4891 Unspecified atrial fibrillation: Principal | ICD-10-CM | POA: Diagnosis present

## 2023-06-15 DIAGNOSIS — I5033 Acute on chronic diastolic (congestive) heart failure: Secondary | ICD-10-CM | POA: Insufficient documentation

## 2023-06-15 DIAGNOSIS — N1831 Chronic kidney disease, stage 3a: Secondary | ICD-10-CM | POA: Diagnosis not present

## 2023-06-15 DIAGNOSIS — I517 Cardiomegaly: Secondary | ICD-10-CM | POA: Diagnosis not present

## 2023-06-15 DIAGNOSIS — J45909 Unspecified asthma, uncomplicated: Secondary | ICD-10-CM | POA: Insufficient documentation

## 2023-06-15 HISTORY — DX: Chronic kidney disease, unspecified: N18.9

## 2023-06-15 HISTORY — DX: Unspecified atrial fibrillation: I48.91

## 2023-06-15 HISTORY — DX: Unspecified asthma, uncomplicated: J45.909

## 2023-06-15 NOTE — ED Triage Notes (Signed)
 The pt is c/o chest pain since Tuesday she had a mi in September  with stent placement she has not felt any better today.  Sl sob   chronic feet and ankle swelling since her mi in september

## 2023-06-16 ENCOUNTER — Emergency Department (HOSPITAL_COMMUNITY)

## 2023-06-16 ENCOUNTER — Encounter: Payer: Self-pay | Admitting: Family Medicine

## 2023-06-16 ENCOUNTER — Encounter (HOSPITAL_COMMUNITY): Payer: Self-pay | Admitting: Family Medicine

## 2023-06-16 ENCOUNTER — Other Ambulatory Visit: Payer: Self-pay | Admitting: Physician Assistant

## 2023-06-16 ENCOUNTER — Other Ambulatory Visit (HOSPITAL_COMMUNITY): Payer: Self-pay

## 2023-06-16 DIAGNOSIS — E876 Hypokalemia: Secondary | ICD-10-CM | POA: Diagnosis not present

## 2023-06-16 DIAGNOSIS — I4891 Unspecified atrial fibrillation: Secondary | ICD-10-CM | POA: Diagnosis not present

## 2023-06-16 DIAGNOSIS — E785 Hyperlipidemia, unspecified: Secondary | ICD-10-CM

## 2023-06-16 DIAGNOSIS — R0989 Other specified symptoms and signs involving the circulatory and respiratory systems: Secondary | ICD-10-CM | POA: Diagnosis not present

## 2023-06-16 DIAGNOSIS — R079 Chest pain, unspecified: Secondary | ICD-10-CM | POA: Diagnosis not present

## 2023-06-16 DIAGNOSIS — N1831 Chronic kidney disease, stage 3a: Secondary | ICD-10-CM

## 2023-06-16 DIAGNOSIS — Z794 Long term (current) use of insulin: Secondary | ICD-10-CM

## 2023-06-16 DIAGNOSIS — J4530 Mild persistent asthma, uncomplicated: Secondary | ICD-10-CM | POA: Diagnosis not present

## 2023-06-16 DIAGNOSIS — E1122 Type 2 diabetes mellitus with diabetic chronic kidney disease: Secondary | ICD-10-CM

## 2023-06-16 DIAGNOSIS — I517 Cardiomegaly: Secondary | ICD-10-CM | POA: Diagnosis not present

## 2023-06-16 DIAGNOSIS — I5033 Acute on chronic diastolic (congestive) heart failure: Secondary | ICD-10-CM

## 2023-06-16 DIAGNOSIS — I10 Essential (primary) hypertension: Secondary | ICD-10-CM

## 2023-06-16 DIAGNOSIS — J45909 Unspecified asthma, uncomplicated: Secondary | ICD-10-CM | POA: Insufficient documentation

## 2023-06-16 DIAGNOSIS — I739 Peripheral vascular disease, unspecified: Secondary | ICD-10-CM

## 2023-06-16 DIAGNOSIS — G4733 Obstructive sleep apnea (adult) (pediatric): Secondary | ICD-10-CM

## 2023-06-16 LAB — BASIC METABOLIC PANEL WITH GFR
Anion gap: 11 (ref 5–15)
BUN: 16 mg/dL (ref 8–23)
CO2: 24 mmol/L (ref 22–32)
Calcium: 9.1 mg/dL (ref 8.9–10.3)
Chloride: 104 mmol/L (ref 98–111)
Creatinine, Ser: 1.13 mg/dL — ABNORMAL HIGH (ref 0.44–1.00)
GFR, Estimated: 50 mL/min — ABNORMAL LOW (ref >60)
Glucose, Bld: 76 mg/dL (ref 70–99)
Potassium: 3.3 mmol/L — ABNORMAL LOW (ref 3.5–5.1)
Sodium: 139 mmol/L (ref 135–145)

## 2023-06-16 LAB — CBC
HCT: 46.2 % — ABNORMAL HIGH (ref 36.0–46.0)
Hemoglobin: 14.7 g/dL (ref 12.0–15.0)
MCH: 27.4 pg (ref 26.0–34.0)
MCHC: 31.8 g/dL (ref 30.0–36.0)
MCV: 86 fL (ref 80.0–100.0)
Platelets: 252 10*3/uL (ref 150–400)
RBC: 5.37 MIL/uL — ABNORMAL HIGH (ref 3.87–5.11)
RDW: 15.3 % (ref 11.5–15.5)
WBC: 11.9 10*3/uL — ABNORMAL HIGH (ref 4.0–10.5)
nRBC: 0 % (ref 0.0–0.2)

## 2023-06-16 LAB — MAGNESIUM: Magnesium: 1.6 mg/dL — ABNORMAL LOW (ref 1.7–2.4)

## 2023-06-16 LAB — TROPONIN I (HIGH SENSITIVITY)
Troponin I (High Sensitivity): 13 ng/L (ref <18)
Troponin I (High Sensitivity): 17 ng/L (ref <18)

## 2023-06-16 LAB — T4, FREE: Free T4: 0.93 ng/dL (ref 0.61–1.12)

## 2023-06-16 LAB — D-DIMER, QUANTITATIVE: D-Dimer, Quant: 0.42 ug{FEU}/mL (ref 0.00–0.50)

## 2023-06-16 LAB — BRAIN NATRIURETIC PEPTIDE: B Natriuretic Peptide: 628.7 pg/mL — ABNORMAL HIGH (ref 0.0–100.0)

## 2023-06-16 LAB — TSH: TSH: 2.835 u[IU]/mL (ref 0.350–4.500)

## 2023-06-16 LAB — GLUCOSE, CAPILLARY: Glucose-Capillary: 147 mg/dL — ABNORMAL HIGH (ref 70–99)

## 2023-06-16 LAB — CBG MONITORING, ED
Glucose-Capillary: 96 mg/dL (ref 70–99)
Glucose-Capillary: 94 mg/dL (ref 70–99)

## 2023-06-16 MED ORDER — POTASSIUM CHLORIDE 10 MEQ/100ML IV SOLN
10.0000 meq | Freq: Once | INTRAVENOUS | Status: AC
  Administered 2023-06-16: 10 meq via INTRAVENOUS
  Filled 2023-06-16: qty 100

## 2023-06-16 MED ORDER — INSULIN ASPART 100 UNIT/ML IJ SOLN: 0.0000 [IU] | INTRAMUSCULAR | Status: DC

## 2023-06-16 MED ORDER — ACETAMINOPHEN 325 MG PO TABS: 650.0000 mg | ORAL_TABLET | Freq: Four times a day (QID) | ORAL | Status: DC | PRN

## 2023-06-16 MED ORDER — METOPROLOL SUCCINATE ER 50 MG PO TB24
150.0000 mg | ORAL_TABLET | Freq: Every day | ORAL | Status: DC
  Administered 2023-06-16: 150 mg via ORAL
  Filled 2023-06-16: qty 6

## 2023-06-16 MED ORDER — DILTIAZEM HCL-DEXTROSE 125-5 MG/125ML-% IV SOLN (PREMIX)
5.0000 mg/h | INTRAVENOUS | Status: AC
Start: 2023-06-16 — End: 2023-06-16
  Administered 2023-06-16: 5 mg/h via INTRAVENOUS
  Filled 2023-06-16: qty 125

## 2023-06-16 MED ORDER — FLUTICASONE PROPIONATE 50 MCG/ACT NA SUSP
2.0000 | Freq: Every day | NASAL | Status: DC
  Filled 2023-06-16: qty 16

## 2023-06-16 MED ORDER — ACETAMINOPHEN 650 MG RE SUPP: 650.0000 mg | Freq: Four times a day (QID) | RECTAL | Status: DC | PRN

## 2023-06-16 MED ORDER — MAGNESIUM SULFATE 2 GM/50ML IV SOLN
2.0000 g | Freq: Once | INTRAVENOUS | Status: AC
  Administered 2023-06-16: 2 g via INTRAVENOUS
  Filled 2023-06-16: qty 50

## 2023-06-16 MED ORDER — POTASSIUM CHLORIDE ER 10 MEQ PO TBCR: 10.0000 meq | EXTENDED_RELEASE_TABLET | Freq: Every day | ORAL | Status: DC

## 2023-06-16 MED ORDER — FUROSEMIDE 10 MG/ML IJ SOLN
40.0000 mg | Freq: Two times a day (BID) | INTRAMUSCULAR | Status: DC
  Administered 2023-06-16: 40 mg via INTRAVENOUS
  Filled 2023-06-16: qty 4

## 2023-06-16 MED ORDER — FUROSEMIDE 10 MG/ML IJ SOLN
20.0000 mg | Freq: Once | INTRAMUSCULAR | Status: AC
  Administered 2023-06-16: 20 mg via INTRAVENOUS
  Filled 2023-06-16: qty 2

## 2023-06-16 MED ORDER — SPIRONOLACTONE 25 MG PO TABS
25.0000 mg | ORAL_TABLET | Freq: Every day | ORAL | Status: DC
  Administered 2023-06-16: 25 mg via ORAL
  Filled 2023-06-16: qty 1

## 2023-06-16 MED ORDER — DILTIAZEM LOAD VIA INFUSION
10.0000 mg | Freq: Once | INTRAVENOUS | Status: AC
  Administered 2023-06-16: 10 mg via INTRAVENOUS
  Filled 2023-06-16: qty 10

## 2023-06-16 MED ORDER — ONDANSETRON HCL 4 MG/2ML IJ SOLN: 4.0000 mg | Freq: Four times a day (QID) | INTRAMUSCULAR | Status: DC | PRN

## 2023-06-16 MED ORDER — LATANOPROSTENE BUNOD 0.024 % OP SOLN: 1.0000 [drp] | Freq: Every day | OPHTHALMIC | Status: DC

## 2023-06-16 MED ORDER — PANTOPRAZOLE SODIUM 40 MG PO TBEC
40.0000 mg | DELAYED_RELEASE_TABLET | Freq: Every day | ORAL | Status: DC
  Administered 2023-06-16: 40 mg via ORAL
  Filled 2023-06-16: qty 1

## 2023-06-16 MED ORDER — DILTIAZEM HCL ER COATED BEADS 180 MG PO CP24
180.0000 mg | ORAL_CAPSULE | Freq: Every day | ORAL | Status: DC
  Administered 2023-06-16: 180 mg via ORAL
  Filled 2023-06-16: qty 1

## 2023-06-16 MED ORDER — ISOSORBIDE MONONITRATE ER 60 MG PO TB24
60.0000 mg | ORAL_TABLET | Freq: Every day | ORAL | Status: DC
  Administered 2023-06-16: 60 mg via ORAL
  Filled 2023-06-16: qty 1

## 2023-06-16 MED ORDER — APIXABAN 5 MG PO TABS
5.0000 mg | ORAL_TABLET | Freq: Two times a day (BID) | ORAL | Status: DC
  Administered 2023-06-16: 5 mg via ORAL
  Filled 2023-06-16 (×2): qty 1

## 2023-06-16 MED ORDER — POTASSIUM CHLORIDE CRYS ER 20 MEQ PO TBCR
40.0000 meq | EXTENDED_RELEASE_TABLET | Freq: Two times a day (BID) | ORAL | Status: DC
  Administered 2023-06-16: 40 meq via ORAL
  Filled 2023-06-16 (×2): qty 2

## 2023-06-16 MED ORDER — ONDANSETRON HCL 4 MG PO TABS: 4.0000 mg | ORAL_TABLET | Freq: Four times a day (QID) | ORAL | Status: DC | PRN

## 2023-06-16 MED ORDER — DORZOLAMIDE HCL-TIMOLOL MAL 2-0.5 % OP SOLN
1.0000 [drp] | Freq: Two times a day (BID) | OPHTHALMIC | Status: DC
  Filled 2023-06-16: qty 10

## 2023-06-16 MED ORDER — DILTIAZEM HCL ER COATED BEADS 180 MG PO CP24
180.0000 mg | ORAL_CAPSULE | Freq: Every day | ORAL | 0 refills | Status: DC
  Filled 2023-06-16: qty 90, 90d supply, fill #0

## 2023-06-16 MED ORDER — METOPROLOL TARTRATE 5 MG/5ML IV SOLN
2.5000 mg | Freq: Once | INTRAVENOUS | Status: AC
  Administered 2023-06-16: 2.5 mg via INTRAVENOUS
  Filled 2023-06-16: qty 5

## 2023-06-16 NOTE — Plan of Care (Signed)
  Problem: Education: Goal: Knowledge of General Education information will improve Description: Including pain rating scale, medication(s)/side effects and non-pharmacologic comfort measures Outcome: Adequate for Discharge   Problem: Health Behavior/Discharge Planning: Goal: Ability to manage health-related needs will improve Outcome: Adequate for Discharge   Problem: Clinical Measurements: Goal: Ability to maintain clinical measurements within normal limits will improve Outcome: Adequate for Discharge Goal: Will remain free from infection Outcome: Adequate for Discharge Goal: Diagnostic test results will improve Outcome: Adequate for Discharge Goal: Respiratory complications will improve Outcome: Adequate for Discharge Goal: Cardiovascular complication will be avoided Outcome: Adequate for Discharge   Problem: Activity: Goal: Risk for activity intolerance will decrease Outcome: Adequate for Discharge   Problem: Nutrition: Goal: Adequate nutrition will be maintained Outcome: Adequate for Discharge   Problem: Coping: Goal: Level of anxiety will decrease Outcome: Adequate for Discharge   Problem: Elimination: Goal: Will not experience complications related to bowel motility Outcome: Adequate for Discharge Goal: Will not experience complications related to urinary retention Outcome: Adequate for Discharge   Problem: Pain Managment: Goal: General experience of comfort will improve and/or be controlled Outcome: Adequate for Discharge   Problem: Safety: Goal: Ability to remain free from injury will improve Outcome: Adequate for Discharge   Problem: Skin Integrity: Goal: Risk for impaired skin integrity will decrease Outcome: Adequate for Discharge   Problem: Education: Goal: Ability to demonstrate management of disease process will improve Outcome: Adequate for Discharge Goal: Ability to verbalize understanding of medication therapies will improve Outcome: Adequate  for Discharge Goal: Individualized Educational Video(s) Outcome: Adequate for Discharge   Problem: Activity: Goal: Capacity to carry out activities will improve Outcome: Adequate for Discharge   Problem: Cardiac: Goal: Ability to achieve and maintain adequate cardiopulmonary perfusion will improve Outcome: Adequate for Discharge   Problem: Education: Goal: Knowledge of disease or condition will improve Outcome: Adequate for Discharge Goal: Understanding of medication regimen will improve Outcome: Adequate for Discharge Goal: Individualized Educational Video(s) Outcome: Adequate for Discharge   Problem: Activity: Goal: Ability to tolerate increased activity will improve Outcome: Adequate for Discharge   Problem: Cardiac: Goal: Ability to achieve and maintain adequate cardiopulmonary perfusion will improve Outcome: Adequate for Discharge   Problem: Health Behavior/Discharge Planning: Goal: Ability to safely manage health-related needs after discharge will improve Outcome: Adequate for Discharge   Problem: Education: Goal: Ability to describe self-care measures that may prevent or decrease complications (Diabetes Survival Skills Education) will improve Outcome: Adequate for Discharge Goal: Individualized Educational Video(s) Outcome: Adequate for Discharge   Problem: Coping: Goal: Ability to adjust to condition or change in health will improve Outcome: Adequate for Discharge   Problem: Fluid Volume: Goal: Ability to maintain a balanced intake and output will improve Outcome: Adequate for Discharge   Problem: Health Behavior/Discharge Planning: Goal: Ability to identify and utilize available resources and services will improve Outcome: Adequate for Discharge Goal: Ability to manage health-related needs will improve Outcome: Adequate for Discharge   Problem: Metabolic: Goal: Ability to maintain appropriate glucose levels will improve Outcome: Adequate for Discharge    Problem: Nutritional: Goal: Maintenance of adequate nutrition will improve Outcome: Adequate for Discharge Goal: Progress toward achieving an optimal weight will improve Outcome: Adequate for Discharge   Problem: Skin Integrity: Goal: Risk for impaired skin integrity will decrease Outcome: Adequate for Discharge   Problem: Tissue Perfusion: Goal: Adequacy of tissue perfusion will improve Outcome: Adequate for Discharge

## 2023-06-16 NOTE — ED Provider Notes (Addendum)
 [place] EMERGENCY DEPARTMENT AT [facility] Provider Note   CSN: [id number] Arrival date & time: 06/15/23  2314     History  No chief complaint on file.   Dawn Wood is a 78 y.o. female.  78 yo F here with multiple medical problems to include CAD s/p NSTEMI treated with PCI/DES to LAD with 60% RCA and 40% LCx, HTN, HLD, PVD, DM type II, PAF (on Eliquis), arthritis,  here with chest pain shortness of breath.  She had an MI back in September.  She states that she has had worsening symptoms since then but really the last couple days she has had a lot of chest tightness, dyspnea on exertion and chest tightness with his exertion.  She also noted worsening lower extremity edema she has been wearing compression stockings.  No fevers or cough.  Symptoms are worse when she lays flat.  She tried her inhaler at home which did not seem to help much.  On review of her record she also has a history of A-fib.  She is also on Lasix.        Home Medications Prior to Admission medications   Medication Sig Start Date End Date Taking? Authorizing Provider  Accu-Chek Softclix Lancets lancets USE TO CHECK BLOOD SUGARS ONCE DAILY. 05/26/22   Dorothyann Peng, MD  albuterol (VENTOLIN HFA) 108 (90 Base) MCG/ACT inhaler Inhale 2 puffs into the lungs every 6 (six) hours as needed for wheezing or shortness of breath. 03/31/23   Luciano Cutter, MD  amLODipine (NORVASC) 5 MG tablet Take 1 tablet (5 mg total) by mouth daily. 03/04/23   Sharlene Dory, PA-C  BD PEN NEEDLE NANO 2ND GEN 32G X 4 MM MISC USE AS DIRECTED TWICE DAILY 01/06/23   Dorothyann Peng, MD  Blood Glucose Monitoring Suppl (ACCU-CHEK GUIDE) w/Device KIT USE TO CHECK BLOOD SUGARS ONCE DAILY. 05/26/22   Dorothyann Peng, MD  budesonide-formoterol [facility]) 160-4.5 MCG/ACT inhaler Inhale 2 puffs into the lungs 2 (two) times daily. 05/25/23   Luciano Cutter, MD  Cholecalciferol (VITAMIN D3) 2000 units TABS Take 1 tablet by mouth  daily.    [provider]  colchicine 0.6 MG tablet Take 1 tablet (0.6 mg total) by mouth as needed (for gout). 06/07/23   Dorothyann Peng, MD  ELIQUIS 5 MG TABS tablet TAKE 1 TABLET(5 MG) BY MOUTH TWICE DAILY 05/06/23   Sharlene Dory, PA-C  Evolocumab (REPATHA SURECLICK) 140 MG/ML SOAJ Inject 140 mg into the skin every 14 (fourteen) days. 08/12/22   Meriam Sprague, MD  fluticasone (FLONASE) 50 MCG/ACT nasal spray Place 2 sprays into both nostrils daily. 01/13/23   Ellender Hose, NP  furosemide (LASIX) 20 MG tablet TAKE 1 TABLET(20 MG) BY MOUTH DAILY 02/22/23   Jari Favre N, PA-C  glucose blood (ACCU-CHEK GUIDE) test strip USE TO CHECK BLOOD SUGARS ONCE DAILY. 05/21/22   Dorothyann Peng, MD  insulin aspart protamine - aspart (NOVOLOG MIX 70/30 FLEXPEN) (70-30) 100 UNIT/ML FlexPen INJECT 40 UNITS SUBCUTANEOUS EVERY MORNING AND 50 UNITS SUBCUTANEOUS AT [facility] 04/04/23   Dorothyann Peng, MD  isosorbide mononitrate (IMDUR) 60 MG 24 hr tablet Take 1 tablet (60 mg total) by mouth daily. Pt takes 1 tablet by mouth daily. 11/25/22   Gaston Islam., NP  ketorolac (ACULAR) 0.5 % ophthalmic solution Place 1 drop into the right eye 3 (three) times daily. 07/18/22   [provider]  latanoprost (XALATAN) 0.005 % ophthalmic solution  01/14/23  [provider]  metoprolol succinate (TOPROL-XL) 100 MG 24 hr tablet Take 1.5 tablets (150 mg total) by mouth daily. Take with or immediately following a meal. 12/22/22   Dick, Devoria Albe., NP  nitroGLYCERIN (NITROSTAT) 0.4 MG SL tablet Place 1 tablet (0.4 mg total) under the tongue every 5 (five) minutes as needed for chest pain. 08/30/22   Meriam Sprague, MD  olmesartan (BENICAR) 40 MG tablet TAKE 1 TABLET(40 MG) BY MOUTH DAILY 05/24/23   Dorothyann Peng, MD  [facility] VERIO test strip USE TO CHECK BLOOD SUGARS TWICE DAILY AS DIRECTED 05/18/22   Dorothyann Peng, MD  pantoprazole (PROTONIX) 40 MG tablet Take 1 tablet (40 mg total) by mouth daily.  12/10/22 06/08/23  Ellender Hose, NP  potassium chloride (KLOR-CON) 10 MEQ tablet Take 1 tablet (10 mEq total) by mouth daily. 02/28/23   Orbie Pyo, MD  Semaglutide,0.25 or 0.5MG /DOS, (OZEMPIC, 0.25 OR 0.5 MG/DOSE,) 2 MG/1.5ML SOPN INJECT 0.5MG  INTO SKIN ONCE A WEEK 06/22/21   Dorothyann Peng, MD  sodium chloride (MURO 128) 5 % ophthalmic solution Place 1 drop into the right eye 2 (two) times daily. 09/04/22 09/04/23  [provider]  spironolactone (ALDACTONE) 25 MG tablet Take 1 tablet (25 mg total) by mouth daily. 10/22/22   Gaston Islam., NP  traMADol Janean Sark) 50 MG tablet Take 50 mg by mouth daily at 6 (six) AM. 06/07/22   [provider]  VYZULTA 0.024 % SOLN Apply 1 drop to eye at bedtime. 07/22/22   [provider]      Allergies    Dust mite extract, Other, Statins, Allopurinol, and Pollen extract    Review of Systems   Review of Systems  Physical Exam Updated Vital Signs BP 123/81   Pulse 78   Temp 98.1 F (36.7 C) (Oral)   Resp 16   Ht 5\' 4"  (1.626 m)   Wt 99.3 kg   SpO2 97%   BMI 37.58 kg/m  Physical Exam Vitals and nursing note reviewed.  Constitutional:      Appearance: She is well-developed.  HENT:     Head: Normocephalic and atraumatic.  Eyes:     Pupils: Pupils are equal, round, and reactive to light.  Cardiovascular:     Rate and Rhythm: Tachycardia present. Rhythm irregular.  Pulmonary:     Effort: Tachypnea present. No respiratory distress.     Breath sounds: Decreased air movement present. No stridor.  Abdominal:     General: There is no distension.  Musculoskeletal:     Cervical back: Normal range of motion.  Skin:    General: Skin is warm and dry.  Neurological:     General: No focal deficit present.     Mental Status: She is alert.     ED Results / Procedures / Treatments   Labs (all labs ordered are listed, but only abnormal results are displayed) Labs Reviewed  BASIC METABOLIC PANEL WITH GFR - Abnormal;  Notable for the following components:      Result Value   Potassium 3.3 (*)    Creatinine, Ser 1.13 (*)    GFR, Estimated 50 (*)    All other components within normal limits  CBC - Abnormal; Notable for the following components:   WBC 11.9 (*)    RBC 5.37 (*)    HCT 46.2 (*)    All other components within normal limits  MAGNESIUM - Abnormal; Notable for the following components:   Magnesium 1.6 (*)  All other components within normal limits  BRAIN NATRIURETIC PEPTIDE - Abnormal; Notable for the following components:   B Natriuretic Peptide 628.7 (*)    All other components within normal limits  TSH  T4, FREE  D-DIMER, QUANTITATIVE  TROPONIN I (HIGH SENSITIVITY)  TROPONIN I (HIGH SENSITIVITY)    EKG None  Radiology DG Chest Portable 1 View Result Date: 06/16/2023 CLINICAL DATA:  Chest pain. EXAM: PORTABLE CHEST 1 VIEW COMPARISON:  April 11, 2019 FINDINGS: The cardiac silhouette is mildly enlarged and unchanged in size. Stable prominence of the right hilar pulmonary vasculature is noted. Low lung volumes are seen without evidence of acute infiltrate, pleural effusion or pneumothorax. Multilevel degenerative changes are seen throughout the thoracic spine. IMPRESSION: 1. Stable cardiomegaly with stable prominence of the right hilar pulmonary vasculature. 2. No acute cardiopulmonary disease. Electronically Signed   By: Aram Candela M.D.   On: 06/16/2023 03:35    Procedures .Critical Care  Performed by: Marily Memos, MD Authorized by: Marily Memos, MD   Critical care provider statement:    Critical care time (minutes):  30   Critical care was necessary to treat or prevent imminent or life-threatening deterioration of the following conditions:  Circulatory failure and metabolic crisis   Critical care was time spent personally by me on the following activities:  Development of treatment plan with patient or surrogate, discussions with consultants, evaluation of patient's  response to treatment, examination of patient, ordering and review of laboratory studies, ordering and review of radiographic studies, ordering and performing treatments and interventions, pulse oximetry, re-evaluation of patient's condition and review of old charts     Medications Ordered in ED Medications  diltiazem (CARDIZEM) 1 mg/mL load via infusion 10 mg (10 mg Intravenous Bolus from Bag 06/16/23 0510)    And  diltiazem (CARDIZEM) 125 mg in dextrose 5% 125 mL (1 mg/mL) infusion (5 mg/hr Intravenous New Bag/Given 06/16/23 0510)  magnesium sulfate IVPB 2 g 50 mL (0 g Intravenous Stopped 06/16/23 0326)  potassium chloride 10 mEq in 100 mL IVPB (0 mEq Intravenous Stopped 06/16/23 0326)  metoprolol tartrate (LOPRESSOR) injection 2.5 mg (2.5 mg Intravenous Given 06/16/23 0220)  furosemide (LASIX) injection 20 mg (20 mg Intravenous Given 06/16/23 0334)    ED Course/ Medical Decision Making/ A&P                                 Medical Decision Making Amount and/or Complexity of Data Reviewed Labs: ordered. Radiology: ordered.  Risk Prescription drug management. Decision regarding hospitalization.   Patient here with chest pain shortness of breath.  She had an MI back in September.  She states that she has had worsening symptoms since then but really the last couple days she has had a lot of chest tightness, dyspnea on exertion and chest tightness with his exertion.  She also noted worsening lower extremity edema she has been wearing compression stockings.  No fevers or cough.  Symptoms are worse when she lays flat.  She tried her inhaler at home which did not seem to help much.  On review of her record she also has a history of A-fib.  She is also on Lasix.  At this time I suspect that the patient has a flutter/fib with rapid ventricular response causing pulmonary edema.  Her labs support this.  D-dimer was negative I think PE is unlikely.  She has not infectious symptoms that think that is  also  unlikely.  She is mildly hypokalemic and hypomagnesemic which definitely could make the tachycardia worse.  Initially tried to give her some of her home metoprolol, potassium and magnesium however she was still pretty consistently above 100 but definitely in the 140s whenever she would move around or stand up.  Diltiazem bolus and infusion ordered.  Will discuss with hospitalist for admission.  Has urinated quite a bit.   Final Clinical Impression(s) / ED Diagnoses Final diagnoses:  Atrial fibrillation with rapid ventricular response (HCC)  Hypokalemia  Hypomagnesemia  Acute pulmonary edema [facility])    Rx / DC Orders ED Discharge Orders     None        Adaleen Hulgan, Barbara Cower, MD 06/16/23 [phone]

## 2023-06-16 NOTE — Assessment & Plan Note (Addendum)
 Hypokalemia - Supp mag and K

## 2023-06-16 NOTE — Assessment & Plan Note (Addendum)
 Echo last fall with EF 60-65%, but severe LVH and grade I DD.  Here, noted to have chronic LE edema, chronic orthopnea, CXR with CM and increased interstitial markings. - Continue IV Lasix - Continue home spironolactone - Consult Cardiology - K supplement - Strict I/Os, daily weights, telemetry  - Daily monitoring renal function

## 2023-06-16 NOTE — Assessment & Plan Note (Signed)
 BMI 37 in setting of HTN, DM, OSA, HLD.  On Ozempic.

## 2023-06-16 NOTE — Assessment & Plan Note (Signed)
 CPAP.

## 2023-06-16 NOTE — H&P (Signed)
 History and Physical    PatientLuvinia Wood AVW:[id number] DOB: 24-Apr-1945 DOA: 06/15/2023 DOS: the patient was seen and examined on 06/16/2023 PCP: Dorothyann Peng, MD  Patient coming from: Home  Chief Complaint:  Chief Complaint  Patient presents with   Chest Pain and palpitations       HPI:  78 y.o. F with obesity, CAD s/p PCI x1 to LAD 11/2022 and CTO RCA, Afib on Plavix Eliquis, asthma, DM, CKD IIIa baseline 0.9-1.1, PVD, HLD and OSA who presented with sudden onset palpitations and chest discomfort/SOB.  Since stent, has had less energy, has had chronic LE edema and chronic SOB.  But had seen Pulm (diagnosed with mild asthma) and Cardiology and seemed to be doing okay.  Then 2 days PTA woke with palpitations, chest discomfort, which resolved spontaneously.  Then the day PTA, it came back and didn't go away.  She had palpiations, chest discomfort and increased SOB so she came o to the ER.    In the ER, ECG showed Aflutter Rates 157.  Troponin negative.  BNP 628.  TSH normal.  CXR showed cardiomegaly and increased vasculature.  Started on Lasix, metoprolol without resolution, and so then started on diltiazem drip and hospitalist service were asked to evaluate.      Review of Systems  Constitutional:  Negative for chills and fever.  HENT:  Negative for congestion and sore throat.   Respiratory:  Positive for shortness of breath and wheezing. Negative for cough and sputum production.   Cardiovascular:  Positive for chest pain, palpitations and leg swelling. Negative for claudication and PND.  All other systems reviewed and are negative.    Past Medical History:  Diagnosis Date   Asthma    Atrial fibrillation (HCC)    CKD (chronic kidney disease)    Coronary artery disease    Diabetes mellitus without complication (HCC)    GERD (gastroesophageal reflux disease)    Heart attack (HCC) 12/2022   Hypertension    MI (myocardial infarction) (HCC)    Peripheral  vascular disease (HCC)    Sleep apnea    Past Surgical History:  Procedure Laterality Date   ABDOMINAL HYSTERECTOMY  1985   APPENDECTOMY     CATARACT EXTRACTION Left 02/20/2018   Dr. Harlon Flor   CATARACT EXTRACTION Bilateral 2024   cataract surgery Right 01/25/2022   Second at [facility] 11/29   CORONARY STENT INTERVENTION N/A 11/30/2022   Procedure: CORONARY STENT INTERVENTION;  Surgeon: Swaziland, Peter M, MD;  Location: [facility] INVASIVE CV LAB;  Service: Cardiovascular;  Laterality: N/A;  LAD, RCA   CORONARY STENT PLACEMENT  2015   CORONARY ULTRASOUND/IVUS N/A 11/30/2022   Procedure: Coronary Ultrasound/IVUS;  Surgeon: Swaziland, Peter M, MD;  Location: [facility] INVASIVE CV LAB;  Service: Cardiovascular;  Laterality: N/A;  LAD, RCA   ectopic pregnancy--unilateral salpingectomy  1980   KNEE ARTHROSCOPY Left 03/2016   LEFT HEART CATH AND CORONARY ANGIOGRAPHY N/A 11/30/2022   Procedure: LEFT HEART CATH AND CORONARY ANGIOGRAPHY;  Surgeon: Swaziland, Peter M, MD;  Location: [facility] INVASIVE CV LAB;  Service: Cardiovascular;  Laterality: N/A;   unilateral oophorectomy     Social History:  reports that she quit smoking about 10 years ago. Her smoking use included cigarettes. She started smoking about 55 years ago. She has a 11.3 pack-year smoking history. She has never been exposed to tobacco smoke. She has never used smokeless tobacco. She reports that she does not currently use alcohol. She reports that she does not use  drugs.  Allergies  Allergen Reactions   Dust Mite Extract Other (See Comments)    REACTION: SNEEZING   Other Other (See Comments)    ALLERGEN: DEODORIZERS SPRAY AND PERFUMES REACTION: SNEEZING   Statins Other (See Comments)    Muscle aches with rosuvastatin 5 mg daily and 3x/weekly, atorvastatin 20 mg daily and 3x/week, simvastatin 40 mg daily    Allopurinol     Diarrhea and the pt said it made her sugars low   Pollen Extract Other (See Comments)    Reaction unknown    Family History  Problem  Relation Age of Onset   Hypertension Mother    Hypertension Father    Throat cancer Father    Breast cancer Sister 44   Aortic stenosis Daughter    Heart disease Daughter        before age 71   Thyroid disease Daughter    Sleep apnea Neg Hx     Prior to Admission medications   Medication Sig Start Date End Date Taking? Authorizing Provider  amLODipine (NORVASC) 5 MG tablet Take 1 tablet (5 mg total) by mouth daily. 03/04/23  Yes Sharlene Dory, PA-C  Cholecalciferol (VITAMIN D3) 2000 units TABS Take 1 tablet by mouth daily.   Yes [provider]  colchicine 0.6 MG tablet Take 1 tablet (0.6 mg total) by mouth as needed (for gout). 06/07/23  Yes Dorothyann Peng, MD  dorzolamide-timolol (COSOPT) 2-0.5 % ophthalmic solution Place 1 drop into the right eye 2 (two) times daily. 05/12/23  Yes [provider]  ELIQUIS 5 MG TABS tablet TAKE 1 TABLET(5 MG) BY MOUTH TWICE DAILY 05/06/23  Yes Conte, Tessa N, PA-C  Evolocumab (REPATHA SURECLICK) 140 MG/ML SOAJ Inject 140 mg into the skin every 14 (fourteen) days. 08/12/22  Yes Meriam Sprague, MD  fluticasone (FLONASE) 50 MCG/ACT nasal spray Place 2 sprays into both nostrils daily. 01/13/23  Yes Ellender Hose, NP  furosemide (LASIX) 20 MG tablet TAKE 1 TABLET(20 MG) BY MOUTH DAILY 02/22/23  Yes Asa Lente, Tessa N, PA-C  insulin aspart protamine - aspart (NOVOLOG MIX 70/30 FLEXPEN) (70-30) 100 UNIT/ML FlexPen INJECT 40 UNITS SUBCUTANEOUS EVERY MORNING AND 50 UNITS SUBCUTANEOUS AT DINNER Patient taking differently: INJECT 45 UNITS SUBCUTANEOUS EVERY MORNING AND 50 UNITS SUBCUTANEOUS AT [facility] 04/04/23  Yes Dorothyann Peng, MD  isosorbide mononitrate (IMDUR) 60 MG 24 hr tablet Take 1 tablet (60 mg total) by mouth daily. Pt takes 1 tablet by mouth daily. 11/25/22  Yes Gaston Islam., NP  Latanoprostene Bunod (VYZULTA) 0.024 % SOLN Place 1 drop into the right eye at bedtime.   Yes [provider]  metoprolol succinate (TOPROL-XL) 100 MG 24  hr tablet Take 1.5 tablets (150 mg total) by mouth daily. Take with or immediately following a meal. 12/22/22  Yes Dick, Devoria Albe., NP  nitroGLYCERIN (NITROSTAT) 0.4 MG SL tablet Place 1 tablet (0.4 mg total) under the tongue every 5 (five) minutes as needed for chest pain. 08/30/22  Yes Meriam Sprague, MD  olmesartan (BENICAR) 40 MG tablet TAKE 1 TABLET(40 MG) BY MOUTH DAILY 05/24/23  Yes Dorothyann Peng, MD  pantoprazole (PROTONIX) 40 MG tablet Take 1 tablet (40 mg total) by mouth daily. 12/10/22 06/15/24 Yes Ellender Hose, NP  potassium chloride (KLOR-CON) 10 MEQ tablet Take 1 tablet (10 mEq total) by mouth daily. 02/28/23  Yes Orbie Pyo, MD  Semaglutide,0.25 or 0.5MG /DOS, (OZEMPIC, 0.25 OR 0.5 MG/DOSE,) 2 MG/1.5ML SOPN INJECT 0.5MG  INTO SKIN ONCE  A WEEK 06/22/21  Yes Dorothyann Peng, MD  spironolactone (ALDACTONE) 25 MG tablet Take 1 tablet (25 mg total) by mouth daily. 10/22/22  Yes Gaston Islam., NP  Accu-Chek Softclix Lancets lancets USE TO CHECK BLOOD SUGARS ONCE DAILY. 05/26/22   Dorothyann Peng, MD  albuterol (VENTOLIN HFA) 108 (90 Base) MCG/ACT inhaler Inhale 2 puffs into the lungs every 6 (six) hours as needed for wheezing or shortness of breath. Patient not taking: Reported on 06/16/2023 03/31/23   Luciano Cutter, MD  BD PEN NEEDLE NANO 2ND GEN 32G X 4 MM MISC USE AS DIRECTED TWICE DAILY 01/06/23   Dorothyann Peng, MD  Blood Glucose Monitoring Suppl (ACCU-CHEK GUIDE) w/Device KIT USE TO CHECK BLOOD SUGARS ONCE DAILY. 05/26/22   Dorothyann Peng, MD  budesonide-formoterol [facility]) 160-4.5 MCG/ACT inhaler Inhale 2 puffs into the lungs 2 (two) times daily. Patient not taking: Reported on 06/16/2023 05/25/23   Luciano Cutter, MD  glucose blood (ACCU-CHEK GUIDE) test strip USE TO CHECK BLOOD SUGARS ONCE DAILY. 05/21/22   Dorothyann Peng, MD  [facility] VERIO test strip USE TO CHECK BLOOD SUGARS TWICE DAILY AS DIRECTED 05/18/22   Dorothyann Peng, MD    Physical Exam: Vitals:    06/16/23 0530 06/16/23 0600 06/16/23 0730 06/16/23 0758  BP: 123/81 131/87 124/72   Pulse: 78 80 (!) 42   Resp: 16 14 (!) 23   Temp:    98.4 F (36.9 C)  TempSrc:    Oral  SpO2: 97% 95% 100%   Weight:      Height:       Adult female, sitting up in bed, appears tired from being up all night, but responsive and appropriate, well-groomed, hygiene normal Oropharynx moist, no oral lesions, dentition in good repair, lips normal Anicteric, conjunctiva pink, lids and lashes normal, no nasal deformity, discharge, or epistaxis Heart rate irregular, fast, no murmurs, nonpitting lower extremity edema, JVP not visible Respiratory rate normal, lungs clear without rales or wheezes at all Abdomen nondistended, nontender Attention normal, affect normal, judgment and insight appear normal, cranial nerves grossly normal, face symmetric, speech fluent, upper extremity strength 5/5 and symmetric, lower extremity strength normal      Data Reviewed: Discussed with cardiology Basic metabolic panel showed hypomagnesemia and hypokalemia, stable baseline renal function CBC showed no leukocytosis or anemia Chest x-ray, personally reviewed, showed increased vasculature, cardiomegaly, but no pleural effusions EKG, personally reviewed, shows atrial flutter, rate 157 Recent PFTs showed reduced FEV1 and FVC ratio, with bronchodilator response Echocardiogram last September showed preserved EF, severe LVH Recent ABIs showed moderate disease in the left    Assessment and Plan: * Atrial flutter with RVR (HCC) Troponin negative. Low suspicion for ischemia.  ? If volume status is driving this. - Continue diltiazem - Resume home metoprolol - Consult Cardiology re: DCCV - Keep K>4, mag>2     Acute on chronic diastolic CHF (congestive heart failure) (HCC) Echo last fall with EF 60-65%, but severe LVH and grade I DD.  Here, noted to have chronic LE edema, chronic orthopnea, CXR with CM and increased interstitial  markings. - Continue IV Lasix - Continue home spironolactone - Consult Cardiology - K supplement - Strict I/Os, daily weights, telemetry  - Daily monitoring renal function    Hypomagnesemia Hypokalemia - Supp mag and K  Asthma, chronic PFTs last month showed reduced FEV1 and FVC c/w OHS.  There was improvement with BD, and Pulmonology suspect a component of chronic asthma.  No wheezing today.  Do not think this is the main driver - Continue ICS/LABA  OSA (obstructive sleep apnea) - CPAP  Morbid obesity (HCC) BMI 37 in setting of HTN, DM, OSA, HLD.  On Ozempic.  Type 2 diabetes mellitus with stage 3a chronic kidney disease, with long-term current use of insulin (HCC) Recent A1c 6.1% - Hold 70/30 - Levemir 20 BID - SS corrections  Dyslipidemia On Repatha as an outpatient  PVD (peripheral vascular disease) (HCC) Recent ABI 0.76 on left, asymptomatic.  On Eliquis, Repatha   Essential hypertension BP controlled here on dilt drip. - Hold amlodipine - Hold olmesartan for today - Continue furosemide, Imdur, metoprolol, spironolactone         Advance Care Planning: Full code  Consults: Cardiology were consulted, will see patient  Family Communication: None present  Severity of Illness: The appropriate patient status for this patient is OBSERVATION. Observation status is judged to be reasonable and necessary in order to provide the required intensity of service to ensure the patient's safety. The patient's presenting symptoms, physical exam findings, and initial radiographic and laboratory data in the context of their medical condition is felt to place them at decreased risk for further clinical deterioration. Furthermore, it is anticipated that the patient will be medically stable for discharge from the hospital within 2 midnights of admission.   Author: Alberteen Sam, MD 06/16/2023 8:10 AM  For on call review [email or web address].

## 2023-06-16 NOTE — Assessment & Plan Note (Addendum)
 Troponin negative. Low suspicion for ischemia.  ? If volume status is driving this. - Continue diltiazem - Resume home metoprolol - Consult Cardiology re: DCCV - Keep K>4, mag>2

## 2023-06-16 NOTE — ED Notes (Signed)
 CCMD called and notified

## 2023-06-16 NOTE — Assessment & Plan Note (Addendum)
 BP controlled here on dilt drip. - Hold amlodipine - Hold olmesartan for today - Continue furosemide, Imdur, metoprolol, spironolactone

## 2023-06-16 NOTE — Progress Notes (Signed)
 Patient ambulated in room and hallway unassisted off oxygen; no reported SOB, chest pain or other symptoms.  Mild knee pain reported that is chronic for patient; tolerable.  Dr. Maryfrances Bunnell made aware patient is asking for discharge.  Will anticipate orders.  Continuing to monitor.

## 2023-06-16 NOTE — Assessment & Plan Note (Addendum)
 Recent A1c 6.1% - Hold 70/30 - Levemir 20 BID - SS corrections

## 2023-06-16 NOTE — Assessment & Plan Note (Signed)
 PFTs last month showed reduced FEV1 and FVC c/w OHS.  There was improvement with BD, and Pulmonology suspect a component of chronic asthma.  No wheezing today. Do not think this is the main driver - Continue ICS/LABA

## 2023-06-16 NOTE — Hospital Course (Addendum)
 78 y.o. F with obesity, CAD s/p PCI x1 to LAD 11/2022 and CTO RCA, Afib on Plavix Eliquis, asthma, DM, CKD IIIa baseline 0.9-1.1, PVD, HLD and OSA who presented with sudden onset palpitations and chest discomfort/SOB.  Since stent, has had less energy, has had chronic LE edema and chronic SOB.  But had seen Pulm (diagnosed with mild asthma) and Cardiology and seemed to be doing okay.  Then 2 days PTA woke with palpitations, chest discomfort, which resolved spontaneously.  Then the day PTA, it came back and didn't go away.  She had palpiations, chest discomfort and increased SOB so she came o to the ER.    In the ER, ECG showed Aflutter Rates 157.  Troponin negative.  BNP 628.  TSH normal.  CXR showed cardiomegaly and increased vasculature.  Started on Lasix, metoprolol without resolution, and so then started on diltiazem drip and hospitalist service were asked to evaluate.

## 2023-06-16 NOTE — Assessment & Plan Note (Addendum)
 Recent ABI 0.76 on left, asymptomatic.  On Eliquis, Repatha

## 2023-06-16 NOTE — Assessment & Plan Note (Signed)
 On Repatha as an outpatient

## 2023-06-23 ENCOUNTER — Ambulatory Visit: Admitting: Internal Medicine

## 2023-06-23 ENCOUNTER — Encounter: Payer: Self-pay | Admitting: Internal Medicine

## 2023-06-23 VITALS — BP 118/70 | HR 72 | Temp 98.5°F | Ht 65.0 in | Wt 219.8 lb

## 2023-06-23 DIAGNOSIS — E1122 Type 2 diabetes mellitus with diabetic chronic kidney disease: Secondary | ICD-10-CM

## 2023-06-23 DIAGNOSIS — M1A09X0 Idiopathic chronic gout, multiple sites, without tophus (tophi): Secondary | ICD-10-CM

## 2023-06-23 DIAGNOSIS — E66812 Obesity, class 2: Secondary | ICD-10-CM | POA: Diagnosis not present

## 2023-06-23 DIAGNOSIS — N1831 Chronic kidney disease, stage 3a: Secondary | ICD-10-CM

## 2023-06-23 DIAGNOSIS — R0602 Shortness of breath: Secondary | ICD-10-CM

## 2023-06-23 DIAGNOSIS — I1310 Hypertensive heart and chronic kidney disease without heart failure, with stage 1 through stage 4 chronic kidney disease, or unspecified chronic kidney disease: Secondary | ICD-10-CM

## 2023-06-23 DIAGNOSIS — Z794 Long term (current) use of insulin: Secondary | ICD-10-CM

## 2023-06-23 DIAGNOSIS — I4891 Unspecified atrial fibrillation: Secondary | ICD-10-CM | POA: Diagnosis not present

## 2023-06-23 DIAGNOSIS — I5033 Acute on chronic diastolic (congestive) heart failure: Secondary | ICD-10-CM | POA: Diagnosis not present

## 2023-06-23 DIAGNOSIS — K219 Gastro-esophageal reflux disease without esophagitis: Secondary | ICD-10-CM

## 2023-06-23 DIAGNOSIS — Z6836 Body mass index (BMI) 36.0-36.9, adult: Secondary | ICD-10-CM

## 2023-06-23 DIAGNOSIS — I130 Hypertensive heart and chronic kidney disease with heart failure and stage 1 through stage 4 chronic kidney disease, or unspecified chronic kidney disease: Secondary | ICD-10-CM

## 2023-06-23 NOTE — Progress Notes (Signed)
 I,Dawn Wood, CMA,acting as a Neurosurgeon for Smiley Dung, MD.,have documented all relevant documentation on the behalf of Smiley Dung, MD,as directed by  Smiley Dung, MD while in the presence of Smiley Dung, MD.  Subjective:  Patient ID: Dawn Wood , female    DOB: October 09, 1945 , 78 y.o.   MRN: [id number]  Chief Complaint  Patient presents with   Hospitalization Follow-up    Patient presents today for hospital follow up. She visited [facility] on 4/9 & discharged on 4/10. For atrial flutter. Today she reports feeling okay.  She has noticed her feet look " bruised". Along with noticing swelling.  She has not called cardiac rehab to make an appointment. She states she told them she will give them a call back.     HPI Discussed the use of AI scribe software for clinical note transcription with the patient, who gave verbal consent to proceed.  History of Present Illness Dawn Wood is a 78 year old female with atrial flutter and heart failure who presents for a hospital follow-up.  She was admitted to the hospital on April 9th for evaluation of palpitations and chest pain. In the emergency room, her EKG showed atrial flutter with a heart rate of 157 bpm, and her BNP was elevated at 628, indicating heart failure. She was discharged on April 10th and is scheduled to attend a heart failure clinic.  Since discharge, she feels 'okay' but experiences chest soreness, which she attributes to the rapid heart rate. She continues to experience some shortness of breath, which has improved with the use of inhalers, but she still gets short-winded during activities like vacuuming and taking out the trash. Her neighbor assists with taking out the trash, and her daughters help with household chores.  Her current medications include diltiazem  for heart rate control, Eliquis , Repatha , furosemide  20 mg daily, NovoLog  70/30 (45 units in the morning and 50 units at  night), Imdur  60 mg daily, metoprolol  150 mg daily, olmesartan  40 mg daily, pantoprazole  40 mg daily, potassium supplements, semaglutide  0.5 mg weekly, and spironolactone  25 mg. She was switched from Norvasc  to diltiazem  during her hospital stay.  She has a history of gout and takes colchicine  during flares. She reports a sore foot and has been taking colchicine  recently. She has a history of adverse reactions to allopurinol  and Uloric, and her kidney doctor has approved the use of Probenecid , although she has not yet started it.  No new chest pain since discharge.   Past Medical History:  Diagnosis Date   Asthma    Atrial fibrillation (HCC)    CKD (chronic kidney disease)    Coronary artery disease    Diabetes mellitus without complication (HCC)    GERD (gastroesophageal reflux disease)    Heart attack (HCC) 12/2022   Hypertension    MI (myocardial infarction) (HCC)    Peripheral vascular disease (HCC)    Sleep apnea      Family History  Problem Relation Age of Onset   Hypertension Mother    Hypertension Father    Throat cancer Father    Breast cancer Sister 30   Aortic stenosis Daughter    Heart disease Daughter        before age 78   Thyroid  disease Daughter    Sleep apnea Neg Hx      Current Outpatient Medications:    Accu-Chek Softclix Lancets lancets, USE TO CHECK BLOOD SUGARS ONCE DAILY., Disp: 100  each, Rfl: 12   BD PEN NEEDLE NANO 2ND GEN 32G X 4 MM MISC, USE AS DIRECTED TWICE DAILY, Disp: 100 each, Rfl: 2   Blood Glucose Monitoring Suppl (ACCU-CHEK GUIDE) w/Device KIT, USE TO CHECK BLOOD SUGARS ONCE DAILY., Disp: 1 kit, Rfl: 0   Cholecalciferol (VITAMIN D3) 2000 units TABS, Take 1 tablet by mouth daily., Disp: , Rfl:    colchicine  0.6 MG tablet, Take 1 tablet (0.6 mg total) by mouth as needed (for gout)., Disp: 30 tablet, Rfl: 1   dorzolamide -timolol  (COSOPT ) 2-0.5 % ophthalmic solution, Place 1 drop into the right eye 2 (two) times daily., Disp: , Rfl:    ELIQUIS   5 MG TABS tablet, TAKE 1 TABLET(5 MG) BY MOUTH TWICE DAILY, Disp: 180 tablet, Rfl: 1   Evolocumab  (REPATHA  SURECLICK) 140 MG/ML SOAJ, Inject 140 mg into the skin every 14 (fourteen) days., Disp: 6 mL, Rfl: 1   fluticasone  (FLONASE ) 50 MCG/ACT nasal spray, Place 2 sprays into both nostrils daily., Disp: 16 g, Rfl: 2   furosemide  (LASIX ) 20 MG tablet, TAKE 1 TABLET(20 MG) BY MOUTH DAILY, Disp: 30 tablet, Rfl: 10   glucose blood (ACCU-CHEK GUIDE) test strip, USE TO CHECK BLOOD SUGARS ONCE DAILY., Disp: 100 each, Rfl: 12   insulin  aspart protamine - aspart (NOVOLOG  MIX 70/30 FLEXPEN) (70-30) 100 UNIT/ML FlexPen, INJECT 40 UNITS SUBCUTANEOUS EVERY MORNING AND 50 UNITS SUBCUTANEOUS AT DINNER (Patient taking differently: INJECT 45 UNITS SUBCUTANEOUS EVERY MORNING AND 50 UNITS SUBCUTANEOUS AT DINNER), Disp: 30 mL, Rfl: 2   isosorbide  mononitrate (IMDUR ) 60 MG 24 hr tablet, Take 1 tablet (60 mg total) by mouth daily. Pt takes 1 tablet by mouth daily., Disp: 30 tablet, Rfl: 7   Latanoprostene Bunod  (VYZULTA ) 0.024 % SOLN, Place 1 drop into the right eye at bedtime., Disp: , Rfl:    metoprolol  succinate (TOPROL -XL) 100 MG 24 hr tablet, Take 1.5 tablets (150 mg total) by mouth daily. Take with or immediately following a meal., Disp: 135 tablet, Rfl: 3   nitroGLYCERIN  (NITROSTAT ) 0.4 MG SL tablet, Place 1 tablet (0.4 mg total) under the tongue every 5 (five) minutes as needed for chest pain., Disp: 25 tablet, Rfl: 11   olmesartan  (BENICAR ) 40 MG tablet, TAKE 1 TABLET(40 MG) BY MOUTH DAILY, Disp: 90 tablet, Rfl: 2   ONETOUCH VERIO test strip, USE TO CHECK BLOOD SUGARS TWICE DAILY AS DIRECTED, Disp: 100 strip, Rfl: 3   potassium chloride  (KLOR-CON ) 10 MEQ tablet, Take 1 tablet (10 mEq total) by mouth at bedtime., Disp: , Rfl:    Semaglutide ,0.25 or 0.5MG /DOS, (OZEMPIC , 0.25 OR 0.5 MG/DOSE,) 2 MG/1.5ML SOPN, INJECT 0.5MG  INTO SKIN ONCE A WEEK, Disp: 4.5 mL, Rfl: 3   spironolactone  (ALDACTONE ) 25 MG tablet, Take 1  tablet (25 mg total) by mouth daily., Disp: 90 tablet, Rfl: 2   albuterol  (VENTOLIN  HFA) 108 (90 Base) MCG/ACT inhaler, Inhale 2 puffs into the lungs every 6 (six) hours as needed for wheezing or shortness of breath., Disp: 8 g, Rfl: 2   budesonide -formoterol  (SYMBICORT ) 160-4.5 MCG/ACT inhaler, Inhale 2 puffs into the lungs 2 (two) times daily., Disp: 10.2 g, Rfl: 11   diltiazem  (CARDIZEM  CD) 180 MG 24 hr capsule, Take 1 capsule (180 mg total) by mouth daily., Disp: 90 capsule, Rfl: 1   pantoprazole  (PROTONIX ) 40 MG tablet, TAKE 1 TABLET(40 MG) BY MOUTH DAILY, Disp: 90 tablet, Rfl: 1   Allergies  Allergen Reactions   Dust Mite Extract Other (See Comments)    REACTION:  SNEEZING   Other Other (See Comments)    ALLERGEN: DEODORIZERS SPRAY AND PERFUMES REACTION: SNEEZING   Statins Other (See Comments)    Muscle aches with rosuvastatin  5 mg daily and 3x/weekly, atorvastatin  20 mg daily and 3x/week, simvastatin 40 mg daily    Allopurinol      Diarrhea and the pt said it made her sugars low   Pollen Extract Other (See Comments)    Reaction unknown     Review of Systems  Constitutional: Negative.   Respiratory:  Positive for shortness of breath.   Cardiovascular: Negative.   Gastrointestinal: Negative.   Neurological: Negative.   Psychiatric/Behavioral: Negative.       Today's Vitals   06/23/23 1611  BP: 118/70  Pulse: 72  Temp: 98.5 F (36.9 C)  SpO2: 98%  Weight: 219 lb 12.8 oz (99.7 kg)  Height: 5\' 5"  (1.651 m)   Body mass index is 36.58 kg/m.  Wt Readings from Last 3 Encounters:  06/24/23 218 lb 9.6 oz (99.2 kg)  06/23/23 219 lb 12.8 oz (99.7 kg)  06/16/23 218 lb (98.9 kg)     Objective:  Physical Exam Vitals and nursing note reviewed.  Constitutional:      Appearance: Normal appearance.  HENT:     Head: Normocephalic and atraumatic.  Eyes:     Extraocular Movements: Extraocular movements intact.  Cardiovascular:     Rate and Rhythm: Normal rate and regular  rhythm.     Heart sounds: Normal heart sounds.  Pulmonary:     Effort: Pulmonary effort is normal.     Breath sounds: Normal breath sounds.  Musculoskeletal:     Cervical back: Normal range of motion.     Right lower leg: Edema present.     Left lower leg: Edema present.  Skin:    General: Skin is warm.  Neurological:     General: No focal deficit present.     Mental Status: She is alert.  Psychiatric:        Mood and Affect: Mood normal.        Behavior: Behavior normal.       Assessment And Plan:  Atrial flutter with RVR (HCC) Assessment & Plan: TCM PERFORMED. A MEMBER OF THE CLINICAL TEAM SPOKE WITH THE PATIENT UPON DISCHARGE. DISCHARGE SUMMARY WAS REVIEWED IN FULL DETAIL DURING THE VISIT. MEDS RECONCILED AND COMPARED TO DISCHARGE MEDS. MEDICATION LIST WAS UPDATED AND REVIEWED WITH THE PATIENT. GREATER THAN 50% FACE TO FACE TIME WAS SPENT IN COUNSELING AND COORDINATION OF CARE. ALL QUESTIONS WERE ANSWERED TO THE SATISFACTION OF THE PATIENT. She has not had any issues with palpitations since discharge. Atrial flutter with elevated BNP indicating heart failure. Chest soreness likely from previous rapid heart rate. Cardiac rehab recommended for safe exertion. - Continue diltiazem  and metoprolol  for heart rate control. - Continue Eliquis  for anticoagulation. - Recommend cardiac rehab before pulmonary rehab    Type 2 diabetes mellitus with stage 3a chronic kidney disease, with long-term current use of insulin  (HCC) Assessment & Plan: Managed with insulin  and semaglutide . No issues discussed.  Chronic, she is encouraged to stay well hydrated, avoid NSAIDs and keep BP controlled to prevent progression of CKD.   - Continue current insulin  regimen. - Continue semaglutide  (Ozempic ) 0.5 mg weekly.  Orders: -     Microalbumin / creatinine urine ratio  Hypertensive heart and renal disease with renal failure, stage 1 through stage 4 or unspecified chronic kidney disease, with heart  failure (HCC) Assessment & Plan: Chronic,  controlled.  Goal BP<120/80.  She will continue with amlodipine  5mg  daily, furosemide  20mg  daily, Imdur  60mg  daily, olmesartan  40mg  daily and metoprolol  XL 100mg  1-1/2 tabs daily.  She is encouraged to follow low sodium diet.     Acute on chronic diastolic CHF (congestive heart failure) (HCC) Assessment & Plan: Echo last fall with EF 60-65%, but severe LVH and grade I DD.  During her hospitalization, she was noted to have chronic LE edema and chronic orthopnea.  CXR with CM and increased interstitial markings. - Sx improved during hospitalization with iv lasix  - Continue home spironolactone  - K supplement - Encouraged to follow low sodium diet.     Gastroesophageal reflux disease without esophagitis Assessment & Plan: Managed with pantoprazole . No issues discussed. - Continue pantoprazole  40 mg daily.   Shortness of breath Assessment & Plan: She has been evaluated by both Cardiology and Pulmonary. Mild exertional dyspnea persists despite inhaler use. - Continue albuterol  and Symbicort  as prescribed. - Encourage participation in cardiac rehab to improve exertion tolerance.   Idiopathic chronic gout of multiple sites without tophus Assessment & Plan: Experiencing gout flare. Concerns about allopurinol  side effects. Probenecid  discussed but requires nephrologist approval. - Obtain uric acid level. - Obtain documentation from nephrologist regarding Probenecid  safety. - Consult rheumatologist about Probenecid  prescription.   Class 2 severe obesity due to excess calories with serious comorbidity and body mass index (BMI) of 36.0 to 36.9 in adult [facility]) Assessment & Plan: She is encouraged to strive for BMI less than 30 to decrease cardiac risk. Advised to aim for at least 150 minutes of exercise per week.      Return if symptoms worsen or fail to improve.  Patient was given opportunity to ask questions. Patient verbalized understanding  of the plan and was able to repeat key elements of the plan. All questions were answered to their satisfaction.    I, Smiley Dung, MD, have reviewed all documentation for this visit. The documentation on 06/23/23 for the exam, diagnosis, procedures, and orders are all accurate and complete.   IF YOU HAVE BEEN REFERRED TO A SPECIALIST, IT MAY TAKE 1-2 WEEKS TO SCHEDULE/PROCESS THE REFERRAL. IF YOU HAVE NOT HEARD FROM US /SPECIALIST IN TWO WEEKS, PLEASE GIVE US  A CALL AT [phone].   THE PATIENT IS ENCOURAGED TO PRACTICE SOCIAL DISTANCING DUE TO THE COVID-19 PANDEMIC.

## 2023-06-23 NOTE — Patient Instructions (Signed)

## 2023-06-23 NOTE — Discharge Summary (Signed)
 Physician Discharge Summary   Patient: Dawn Wood MRN: [id number] DOB: 08/15/1945  Admit date:     06/15/2023  Discharge date: 06/16/2023  Discharge Physician: Alberteen Sam   PCP: Dorothyann Peng, MD     Recommendations at discharge:  Follow up with Atrial Fibrillation clinic     Discharge Diagnoses: Principal Problem:   Atrial flutter with RVR (HCC) Active Problems:   Acute on chronic diastolic CHF (congestive heart failure) (HCC)   Essential hypertension   PVD (peripheral vascular disease) (HCC)   Dyslipidemia   Type 2 diabetes mellitus with stage 3a chronic kidney disease, with long-term current use of insulin (HCC)   Morbid obesity (HCC)   OSA (obstructive sleep apnea)   Asthma, chronic   Hypomagnesemia   Hypokalemia      Hospital Course: 78 y.o. F with obesity, CAD s/p PCI x1 to LAD 11/2022 and CTO RCA, Afib on Plavix Eliquis, asthma, DM, CKD IIIa baseline 0.9-1.1, PVD, HLD and OSA who presented with sudden onset palpitations and chest discomfort/SOB.  In the ER, found to be in Alutter with rate 150s.  Metoprolol and IV diltiazem push ineffective and so started on diltiazem infusion and admitted.          * Chest pain and shortness of breath due to atrial flutter with RVR (HCC) Started on diltiazem infusion and admitted. Spontaneously cardioverted and transitioned to oral diltiazem.  Remained in sinus rhythm after oral conversion.  Ambulated without symptoms.  Case discussed with Cardiology on call, recommended outpatient follow up.  Appointment arranged.  Discharged with new diltiazem.     Acute on chronic diastolic CHF (congestive heart failure) (HCC) Echo last fall with EF 60-65%, but severe LVH and grade I DD.  Here, noted to have chronic LE edema, chronic orthopnea, CXR with CM and increased interstitial markings.  Due to Afib.  Improved with IV Lasix and chemical cardioversion.   Discharged with close Cardiology follow up.                   The [facility] Controlled Substances Registry was reviewed for this patient prior to discharge.   Consultants: Cardiology by phone, did not have to evaluate patient due to rapid recovery Procedures performed: None  Disposition: Home Diet recommendation:  Discharge Diet Orders (From admission, onward)     Start     Ordered   06/16/23 0000  Diet - low sodium heart healthy        06/16/23 1551             DISCHARGE MEDICATION: Allergies as of 06/16/2023       Reactions   Dust Mite Extract Other (See Comments)   REACTION: SNEEZING   Other Other (See Comments)   ALLERGEN: DEODORIZERS SPRAY AND PERFUMES REACTION: SNEEZING   Statins Other (See Comments)   Muscle aches with rosuvastatin 5 mg daily and 3x/weekly, atorvastatin 20 mg daily and 3x/week, simvastatin 40 mg daily   Allopurinol    Diarrhea and the pt said it made her sugars low   Pollen Extract Other (See Comments)   Reaction unknown        Medication List     STOP taking these medications    amLODipine 5 MG tablet Commonly known as: NORVASC       TAKE these medications    Accu-Chek Guide w/Device Kit USE TO CHECK BLOOD SUGARS ONCE DAILY.   Accu-Chek Softclix Lancets lancets USE TO CHECK BLOOD SUGARS ONCE DAILY.  albuterol 108 (90 Base) MCG/ACT inhaler Commonly known as: VENTOLIN HFA Inhale 2 puffs into the lungs every 6 (six) hours as needed for wheezing or shortness of breath.   BD Pen Needle Nano 2nd Gen 32G X 4 MM Misc Generic drug: Insulin Pen Needle USE AS DIRECTED TWICE DAILY   budesonide-formoterol 160-4.5 MCG/ACT inhaler Commonly known as: Symbicort Inhale 2 puffs into the lungs 2 (two) times daily.   colchicine 0.6 MG tablet Take 1 tablet (0.6 mg total) by mouth as needed (for gout).   diltiazem 180 MG 24 hr capsule Commonly known as: CARDIZEM CD Take 1 capsule (180 mg total) by mouth daily.   dorzolamide-timolol 2-0.5 % ophthalmic solution Commonly known  as: COSOPT Place 1 drop into the right eye 2 (two) times daily.   Eliquis 5 MG Tabs tablet Generic drug: apixaban TAKE 1 TABLET(5 MG) BY MOUTH TWICE DAILY   fluticasone 50 MCG/ACT nasal spray Commonly known as: FLONASE Place 2 sprays into both nostrils daily.   furosemide 20 MG tablet Commonly known as: LASIX TAKE 1 TABLET(20 MG) BY MOUTH DAILY   isosorbide mononitrate 60 MG 24 hr tablet Commonly known as: IMDUR Take 1 tablet (60 mg total) by mouth daily. Pt takes 1 tablet by mouth daily.   metoprolol succinate 100 MG 24 hr tablet Commonly known as: TOPROL-XL Take 1.5 tablets (150 mg total) by mouth daily. Take with or immediately following a meal.   nitroGLYCERIN 0.4 MG SL tablet Commonly known as: Nitrostat Place 1 tablet (0.4 mg total) under the tongue every 5 (five) minutes as needed for chest pain.   NovoLOG Mix 70/30 FlexPen (70-30) 100 UNIT/ML FlexPen Generic drug: insulin aspart protamine - aspart INJECT 40 UNITS SUBCUTANEOUS EVERY MORNING AND 50 UNITS SUBCUTANEOUS AT DINNER What changed: additional instructions   olmesartan 40 MG tablet Commonly known as: BENICAR TAKE 1 TABLET(40 MG) BY MOUTH DAILY   OneTouch Verio test strip Generic drug: glucose blood USE TO CHECK BLOOD SUGARS TWICE DAILY AS DIRECTED   Accu-Chek Guide test strip Generic drug: glucose blood USE TO CHECK BLOOD SUGARS ONCE DAILY.   Ozempic (0.25 or 0.5 MG/DOSE) 2 MG/1.5ML Sopn Generic drug: Semaglutide(0.25 or 0.5MG /DOS) INJECT 0.5MG  INTO SKIN ONCE A WEEK   pantoprazole 40 MG tablet Commonly known as: Protonix Take 1 tablet (40 mg total) by mouth daily.   potassium chloride 10 MEQ tablet Commonly known as: KLOR-CON Take 1 tablet (10 mEq total) by mouth at bedtime. What changed: when to take this   Repatha SureClick 140 MG/ML Soaj Generic drug: Evolocumab Inject 140 mg into the skin every 14 (fourteen) days.   spironolactone 25 MG tablet Commonly known as: ALDACTONE Take 1 tablet  (25 mg total) by mouth daily.   Vitamin D3 50 MCG (2000 UT) Tabs Take 1 tablet by mouth daily.   Vyzulta 0.024 % Soln Generic drug: Latanoprostene Bunod Place 1 drop into the right eye at bedtime.        Follow-up Information      Atrial Fibrillation Clinic at [facility] Follow up on 06/24/2023.   Specialty: Cardiology Why: @10 :00AM. Afib clinic follow up. Afib clinic is inside the [facility], when you come in through the [facility], take the elevator to 6 floor, check with registration desk regarding [facility] afib clinic Contact information: [address] [place]  [phone]                Discharge Instructions  Diet - low sodium heart healthy   Complete by: As directed    Discharge instructions   Complete by: As directed    **IMPORTANT DISCHARGE INSTRUCTIONS**   From Dr. Maryfrances Bunnell: You were evaluated for chest pain and palpitations and found to have rapid atrial flutter (heart rate was 150 beats per minute)  Here, we treated with you with diltiazem and this helped  Resume your normal home medicines EXCEPT:  STOP amlodipine/Norvasc  START diltiazem CD 24 hr 180 mg once daily in the morning  SWITCH your potassium to at night with your evening Eliquis  Go to the Afib clinic in 1 week (see below in the To Do section)   Increase activity slowly   Complete by: As directed        Discharge Exam: Filed Weights   06/15/23 2353 06/16/23 1302  Weight: 99.3 kg 98.9 kg    General: Pt is alert, awake, not in acute distress Cardiovascular: RRR, nl S1-S2, no murmurs appreciated.   No LE edema.   Respiratory: Normal respiratory rate and rhythm.  CTAB without rales or wheezes. Abdominal: Abdomen soft and non-tender.  No distension or HSM.   Neuro/Psych: Strength symmetric in upper and lower extremities.  Judgment and insight appear normal.   Condition at discharge: good  The results of  significant diagnostics from this hospitalization (including imaging, microbiology, ancillary and laboratory) are listed below for reference.   Imaging Studies: DG Chest Portable 1 View Result Date: 06/16/2023 CLINICAL DATA:  Chest pain. EXAM: PORTABLE CHEST 1 VIEW COMPARISON:  April 11, 2019 FINDINGS: The cardiac silhouette is mildly enlarged and unchanged in size. Stable prominence of the right hilar pulmonary vasculature is noted. Low lung volumes are seen without evidence of acute infiltrate, pleural effusion or pneumothorax. Multilevel degenerative changes are seen throughout the thoracic spine. IMPRESSION: 1. Stable cardiomegaly with stable prominence of the right hilar pulmonary vasculature. 2. No acute cardiopulmonary disease. Electronically Signed   By: Aram Candela M.D.   On: 06/16/2023 03:35    Microbiology: Results for orders placed or performed in visit on 01/19/22  Urine Culture     Status: Abnormal   Collection Time: 01/19/22  3:16 PM   Specimen: Urine   UR  Result Value Ref Range Status   Urine Culture, Routine Final report (A)  Final   Organism ID, Bacteria Klebsiella pneumoniae (A)  Final    Comment: Cefazolin <=4 ug/mL Cefazolin with an MIC <=16 predicts susceptibility to the oral agents cefaclor, cefdinir, cefpodoxime, cefprozil, cefuroxime, cephalexin, and loracarbef when used for therapy of uncomplicated urinary tract infections due to E. coli, Klebsiella pneumoniae, and Proteus mirabilis. Greater than 100,000 colony forming units per mL    Antimicrobial Susceptibility Comment  Final    Comment:       ** S = Susceptible; I = Intermediate; R = Resistant **                    P = Positive; N = Negative             MICS are expressed in micrograms per mL    Antibiotic                 RSLT#1    RSLT#2    RSLT#3    RSLT#4 Amoxicillin/Clavulanic Acid    S Ampicillin                     R Cefepime  S Ceftriaxone                     S Cefuroxime                     S Ciprofloxacin                  S Ertapenem                      S Gentamicin                     S Imipenem                       S Levofloxacin                   S Meropenem                      S Nitrofurantoin                 I Piperacillin/Tazobactam        S Tetracycline                   S Tobramycin                     S Trimethoprim/Sulfa             S     Labs: CBC: No results for input(s): "WBC", "NEUTROABS", "HGB", "HCT", "MCV", "PLT" in the last 168 hours. Basic Metabolic Panel: No results for input(s): "NA", "K", "CL", "CO2", "GLUCOSE", "BUN", "CREATININE", "CALCIUM", "MG", "PHOS" in the last 168 hours. Liver Function Tests: No results for input(s): "AST", "ALT", "ALKPHOS", "BILITOT", "PROT", "ALBUMIN" in the last 168 hours. CBG: No results for input(s): "GLUCAP" in the last 168 hours.  Discharge time spent: approximately 45 minutes spent on discharge counseling, evaluation of patient on day of discharge, and coordination of discharge planning with nursing, social work, pharmacy and case management  Signed: Ephriam Hashimoto, MD [facility] 06/23/2023

## 2023-06-24 ENCOUNTER — Ambulatory Visit (HOSPITAL_COMMUNITY)
Admit: 2023-06-24 | Discharge: 2023-06-24 | Disposition: A | Discharge status: Home / Self Care | Attending: Physician Assistant | Admitting: Physician Assistant

## 2023-06-24 ENCOUNTER — Encounter (HOSPITAL_COMMUNITY): Payer: Self-pay | Admitting: Physician Assistant

## 2023-06-24 VITALS — BP 142/88 | HR 64 | Ht 65.0 in | Wt 218.6 lb

## 2023-06-24 DIAGNOSIS — G4733 Obstructive sleep apnea (adult) (pediatric): Secondary | ICD-10-CM | POA: Diagnosis not present

## 2023-06-24 DIAGNOSIS — D6869 Other thrombophilia: Secondary | ICD-10-CM | POA: Diagnosis not present

## 2023-06-24 DIAGNOSIS — E669 Obesity, unspecified: Secondary | ICD-10-CM | POA: Diagnosis not present

## 2023-06-24 DIAGNOSIS — Z7901 Long term (current) use of anticoagulants: Secondary | ICD-10-CM | POA: Insufficient documentation

## 2023-06-24 DIAGNOSIS — I129 Hypertensive chronic kidney disease with stage 1 through stage 4 chronic kidney disease, or unspecified chronic kidney disease: Secondary | ICD-10-CM | POA: Insufficient documentation

## 2023-06-24 DIAGNOSIS — I480 Paroxysmal atrial fibrillation: Secondary | ICD-10-CM | POA: Diagnosis not present

## 2023-06-24 DIAGNOSIS — I2510 Atherosclerotic heart disease of native coronary artery without angina pectoris: Secondary | ICD-10-CM | POA: Diagnosis not present

## 2023-06-24 DIAGNOSIS — Z79899 Other long term (current) drug therapy: Secondary | ICD-10-CM | POA: Insufficient documentation

## 2023-06-24 DIAGNOSIS — Z6836 Body mass index (BMI) 36.0-36.9, adult: Secondary | ICD-10-CM | POA: Diagnosis not present

## 2023-06-24 DIAGNOSIS — I4892 Unspecified atrial flutter: Secondary | ICD-10-CM | POA: Insufficient documentation

## 2023-06-24 LAB — MICROALBUMIN / CREATININE URINE RATIO
Creatinine, Urine: 66.1 mg/dL
Microalb/Creat Ratio: 331 mg/g{creat} — ABNORMAL HIGH (ref 0–29)
Microalbumin, Urine: 219 ug/mL

## 2023-06-24 MED ORDER — DILTIAZEM HCL ER COATED BEADS 180 MG PO CP24: 180.0000 mg | ORAL_CAPSULE | Freq: Every day | ORAL | 1 refills | Status: DC

## 2023-06-24 NOTE — Progress Notes (Addendum)
 Primary Care Physician: Cleave Curling, MD Primary Cardiologist: Francene Ing, Retha Cast, NP Electrophysiologist: None  Referring Physician: Dr Dawn Wood is a 78 y.o. female with a history of CAD, HTN, HLD, PVD, DM, OSA, CKD, atrial fibrillation who presents for follow up in the [facility].  The patient was diagnosed with afib in 2023 on a cardiac monitor. She presented to the ED 06/16/23 with symptoms of SOB and chest tightness with exertion. She was found to be in atrial flutter with elevated rates. She was also fluid overloaded and received IV lasix . She converted to SR with IV diltiazem  and was discharged with oral diltiazem . Patient is on Eliquis  for stroke prevention.   Patient presents today for follow up for atrial fibrillation and atrial flutter. She reports that she has not had any further heart racing. No bleeding issues on anticoagulation.   Today, she denies symptoms of palpitations, chest pain, shortness of breath, orthopnea, PND, lower extremity edema, dizziness, presyncope, syncope, snoring, daytime somnolence, bleeding, or neurologic sequela. The patient is tolerating medications without difficulties and is otherwise without complaint today.    Atrial Fibrillation Risk Factors:  she does have symptoms or diagnosis of sleep apnea. she does not have a history of rheumatic fever. she does not have a history of alcohol use. The patient does not have a history of early familial atrial fibrillation or other arrhythmias.  Atrial Fibrillation Management history:  Previous antiarrhythmic drugs: none Previous cardioversions: none Previous ablations: none Anticoagulation history: Eliquis   ROS- All systems are reviewed and negative except as per the HPI above.  Past Medical History:  Diagnosis Date   Asthma    Atrial fibrillation (HCC)    CKD (chronic kidney disease)    Coronary artery disease    Diabetes mellitus without  complication (HCC)    GERD (gastroesophageal reflux disease)    Heart attack (HCC) 12/2022   Hypertension    MI (myocardial infarction) (HCC)    Peripheral vascular disease (HCC)    Sleep apnea     Current Outpatient Medications  Medication Sig Dispense Refill   Accu-Chek Softclix Lancets lancets USE TO CHECK BLOOD SUGARS ONCE DAILY. 100 each 12   albuterol  (VENTOLIN  HFA) 108 (90 Base) MCG/ACT inhaler Inhale 2 puffs into the lungs every 6 (six) hours as needed for wheezing or shortness of breath. 8 g 2   BD PEN NEEDLE NANO 2ND GEN 32G X 4 MM MISC USE AS DIRECTED TWICE DAILY 100 each 2   Blood Glucose Monitoring Suppl (ACCU-CHEK GUIDE) w/Device KIT USE TO CHECK BLOOD SUGARS ONCE DAILY. 1 kit 0   budesonide -formoterol  (SYMBICORT ) 160-4.5 MCG/ACT inhaler Inhale 2 puffs into the lungs 2 (two) times daily. 10.2 g 11   Cholecalciferol (VITAMIN D3) 2000 units TABS Take 1 tablet by mouth daily.     colchicine  0.6 MG tablet Take 1 tablet (0.6 mg total) by mouth as needed (for gout). 30 tablet 1   diltiazem  (CARDIZEM  CD) 180 MG 24 hr capsule Take 1 capsule (180 mg total) by mouth daily. 90 capsule 0   dorzolamide -timolol  (COSOPT ) 2-0.5 % ophthalmic solution Place 1 drop into the right eye 2 (two) times daily.     ELIQUIS  5 MG TABS tablet TAKE 1 TABLET(5 MG) BY MOUTH TWICE DAILY 180 tablet 1   Evolocumab  (REPATHA  SURECLICK) 140 MG/ML SOAJ Inject 140 mg into the skin every 14 (fourteen) days. 6 mL 1   fluticasone  (FLONASE ) 50 MCG/ACT nasal spray  Place 2 sprays into both nostrils daily. 16 g 2   furosemide  (LASIX ) 20 MG tablet TAKE 1 TABLET(20 MG) BY MOUTH DAILY 30 tablet 10   glucose blood (ACCU-CHEK GUIDE) test strip USE TO CHECK BLOOD SUGARS ONCE DAILY. 100 each 12   insulin  aspart protamine - aspart (NOVOLOG  MIX 70/30 FLEXPEN) (70-30) 100 UNIT/ML FlexPen INJECT 40 UNITS SUBCUTANEOUS EVERY MORNING AND 50 UNITS SUBCUTANEOUS AT DINNER (Patient taking differently: INJECT 45 UNITS SUBCUTANEOUS EVERY  MORNING AND 50 UNITS SUBCUTANEOUS AT DINNER) 30 mL 2   isosorbide  mononitrate (IMDUR ) 60 MG 24 hr tablet Take 1 tablet (60 mg total) by mouth daily. Pt takes 1 tablet by mouth daily. 30 tablet 7   Latanoprostene Bunod  (VYZULTA ) 0.024 % SOLN Place 1 drop into the right eye at bedtime.     metoprolol  succinate (TOPROL -XL) 100 MG 24 hr tablet Take 1.5 tablets (150 mg total) by mouth daily. Take with or immediately following a meal. 135 tablet 3   nitroGLYCERIN  (NITROSTAT ) 0.4 MG SL tablet Place 1 tablet (0.4 mg total) under the tongue every 5 (five) minutes as needed for chest pain. 25 tablet 11   olmesartan  (BENICAR ) 40 MG tablet TAKE 1 TABLET(40 MG) BY MOUTH DAILY 90 tablet 2   ONETOUCH VERIO test strip USE TO CHECK BLOOD SUGARS TWICE DAILY AS DIRECTED 100 strip 3   pantoprazole  (PROTONIX ) 40 MG tablet Take 1 tablet (40 mg total) by mouth daily. 90 tablet 1   potassium chloride  (KLOR-CON ) 10 MEQ tablet Take 1 tablet (10 mEq total) by mouth at bedtime.     Semaglutide ,0.25 or 0.5MG /DOS, (OZEMPIC , 0.25 OR 0.5 MG/DOSE,) 2 MG/1.5ML SOPN INJECT 0.5MG  INTO SKIN ONCE A WEEK 4.5 mL 3   spironolactone  (ALDACTONE ) 25 MG tablet Take 1 tablet (25 mg total) by mouth daily. 90 tablet 2   No current facility-administered medications for this encounter.    Physical Exam: BP (!) 142/88   Pulse 64   Ht 5\' 5"  (1.651 m)   Wt 99.2 kg   BMI 36.38 kg/m   GEN: Well nourished, well developed in no acute distress CARDIAC: Regular rate and rhythm, no murmurs, rubs, gallops RESPIRATORY:  Clear to auscultation without rales, wheezing or rhonchi  ABDOMEN: Soft, non-tender, non-distended EXTREMITIES:  No edema; No deformity   Wt Readings from Last 3 Encounters:  06/24/23 99.2 kg  06/23/23 99.7 kg  06/16/23 98.9 kg     EKG today demonstrates  SR Vent. rate 64 BPM PR interval 144 ms QRS duration 86 ms QT/QTcB 402/414 ms   Echo 12/01/22 demonstrated  1. Left ventricular ejection fraction, by estimation, is  60 to 65%. The  left ventricle has normal function. Left ventricular endocardial border  not optimally defined to evaluate regional wall motion. There is severe  left ventricular hypertrophy of the basal-septal segment. Left ventricular diastolic parameters are consistent with Grade I diastolic dysfunction (impaired relaxation).   2. Right ventricular systolic function is normal. The right ventricular  size is normal. There is normal pulmonary artery systolic pressure. The  estimated right ventricular systolic pressure is 4.4 mmHg.   3. The mitral valve is degenerative. Trivial mitral valve regurgitation.  No evidence of mitral stenosis.   4. The aortic valve was not well visualized. Aortic valve regurgitation  is not visualized. No aortic stenosis is present.   5. The inferior vena cava is normal in size with greater than 50%  respiratory variability, suggesting right atrial pressure of 3 mmHg.   6. Recommend  repeat limited study using definity contrast to assess for  focal wall motion abnormalites.    CHA2DS2-VASc Score = 6  The patient's score is based upon: CHF History: 0 HTN History: 1 Diabetes History: 1 Stroke History: 0 Vascular Disease History: 1 Age Score: 2 Gender Score: 1       ASSESSMENT AND PLAN: Paroxysmal Atrial Fibrillation/atrial flutter The patient's CHA2DS2-VASc score is 6, indicating a 9.7% annual risk of stroke.   Patient in SR today.  We discussed rhythm control options today. Would avoid class IC with h/o CAD. Could consider Multaq, dofetilide, amiodarone, or ablation. Patient would like to continue her present therapy for now.  Continue diltiazem  180 mg daily Continue Eliquis  5 mg BID Continue Toprol  150 mg daily  Secondary Hypercoagulable State (ICD10:  D68.69) The patient is at significant risk for stroke/thromboembolism based upon her CHA2DS2-VASc Score of 6.  Continue Apixaban  (Eliquis ). No bleeding issues.   CAD No anginal symptoms Followed by  Dr Amanda Jungling  HTN Stable on current regimen  OSA  Patient not currently on CPAP Will need to revisit at next appointment.   Obesity Body mass index is 36.38 kg/m.  Encouraged lifestyle modification    Follow up in the AF clinic in 3 months.        Dawn Ates PA-C [facility] [address] [place], [place] [address] [phone]

## 2023-06-25 ENCOUNTER — Other Ambulatory Visit: Payer: Self-pay | Admitting: Family Medicine

## 2023-06-25 ENCOUNTER — Encounter: Payer: Self-pay | Admitting: Internal Medicine

## 2023-06-25 DIAGNOSIS — K219 Gastro-esophageal reflux disease without esophagitis: Secondary | ICD-10-CM

## 2023-06-30 ENCOUNTER — Telehealth (HOSPITAL_COMMUNITY): Payer: Self-pay

## 2023-06-30 NOTE — Telephone Encounter (Signed)
No response from pt in regards to cardiac rehab. Closed referral 

## 2023-07-03 NOTE — Assessment & Plan Note (Signed)
 Echo last fall with EF 60-65%, but severe LVH and grade I DD.  During her hospitalization, she was noted to have chronic LE edema and chronic orthopnea.  CXR with CM and increased interstitial markings. - Sx improved during hospitalization with iv lasix  - Continue home spironolactone  - K supplement - Encouraged to follow low sodium diet.

## 2023-07-03 NOTE — Assessment & Plan Note (Addendum)
 TCM PERFORMED. A MEMBER OF THE CLINICAL TEAM SPOKE WITH THE PATIENT UPON DISCHARGE. DISCHARGE SUMMARY WAS REVIEWED IN FULL DETAIL DURING THE VISIT. MEDS RECONCILED AND COMPARED TO DISCHARGE MEDS. MEDICATION LIST WAS UPDATED AND REVIEWED WITH THE PATIENT. GREATER THAN 50% FACE TO FACE TIME WAS SPENT IN COUNSELING AND COORDINATION OF CARE. ALL QUESTIONS WERE ANSWERED TO THE SATISFACTION OF THE PATIENT. She has not had any issues with palpitations since discharge. Atrial flutter with elevated BNP indicating heart failure. Chest soreness likely from previous rapid heart rate. Cardiac rehab recommended for safe exertion. - Continue diltiazem  and metoprolol  for heart rate control. - Continue Eliquis  for anticoagulation. - Recommend cardiac rehab before pulmonary rehab

## 2023-07-03 NOTE — Assessment & Plan Note (Signed)
 She is encouraged to strive for BMI less than 30 to decrease cardiac risk. Advised to aim for at least 150 minutes of exercise per week.

## 2023-07-03 NOTE — Assessment & Plan Note (Signed)
 She has been evaluated by both Cardiology and Pulmonary. Mild exertional dyspnea persists despite inhaler use. - Continue albuterol  and Symbicort  as prescribed. - Encourage participation in cardiac rehab to improve exertion tolerance.

## 2023-07-03 NOTE — Assessment & Plan Note (Signed)
 Chronic, controlled.  Goal BP<120/80.  She will continue with amlodipine  5mg  daily, furosemide  20mg  daily, Imdur  60mg  daily, olmesartan  40mg  daily and metoprolol  XL 100mg  1-1/2 tabs daily.  She is encouraged to follow low sodium diet.

## 2023-07-03 NOTE — Assessment & Plan Note (Signed)
 Experiencing gout flare. Concerns about allopurinol  side effects. Probenecid  discussed but requires nephrologist approval. - Obtain uric acid level. - Obtain documentation from nephrologist regarding Probenecid  safety. - Consult rheumatologist about Probenecid  prescription.

## 2023-07-03 NOTE — Assessment & Plan Note (Addendum)
 Managed with insulin  and semaglutide . No issues discussed.  Chronic, she is encouraged to stay well hydrated, avoid NSAIDs and keep BP controlled to prevent progression of CKD.   - Continue current insulin  regimen. - Continue semaglutide  (Ozempic ) 0.5 mg weekly.

## 2023-07-03 NOTE — Assessment & Plan Note (Signed)
 Managed with pantoprazole . No issues discussed. - Continue pantoprazole  40 mg daily.

## 2023-07-05 ENCOUNTER — Ambulatory Visit: Payer: Medicare PPO | Admitting: Podiatry

## 2023-07-07 ENCOUNTER — Telehealth: Payer: Self-pay | Admitting: Internal Medicine

## 2023-07-07 DIAGNOSIS — I2510 Atherosclerotic heart disease of native coronary artery without angina pectoris: Secondary | ICD-10-CM

## 2023-07-07 DIAGNOSIS — E785 Hyperlipidemia, unspecified: Secondary | ICD-10-CM

## 2023-07-07 NOTE — Telephone Encounter (Signed)
*  STAT* If patient is at the pharmacy, call can be transferred to refill team.   1. Which medications need to be refilled? (please list name of each medication and dose if known)  Evolocumab  (REPATHA  SURECLICK) 140 MG/ML SOAJ   2. Which pharmacy/location (including street and city if local pharmacy) is medication to be sent to? [facility] - [address], [place], [place] [address]  3. Do they need a 30 day or 90 day supply?   90 day supply

## 2023-07-08 MED ORDER — REPATHA SURECLICK 140 MG/ML ~~LOC~~ SOAJ: 1.0000 | SUBCUTANEOUS | 3 refills | Status: AC

## 2023-07-11 ENCOUNTER — Encounter: Payer: Self-pay | Admitting: Podiatry

## 2023-07-11 ENCOUNTER — Ambulatory Visit (INDEPENDENT_AMBULATORY_CARE_PROVIDER_SITE_OTHER): Admitting: Podiatry

## 2023-07-11 DIAGNOSIS — E1122 Type 2 diabetes mellitus with diabetic chronic kidney disease: Secondary | ICD-10-CM | POA: Diagnosis not present

## 2023-07-11 DIAGNOSIS — N1831 Chronic kidney disease, stage 3a: Secondary | ICD-10-CM

## 2023-07-11 DIAGNOSIS — I739 Peripheral vascular disease, unspecified: Secondary | ICD-10-CM

## 2023-07-11 DIAGNOSIS — B351 Tinea unguium: Secondary | ICD-10-CM

## 2023-07-11 DIAGNOSIS — Z794 Long term (current) use of insulin: Secondary | ICD-10-CM | POA: Diagnosis not present

## 2023-07-11 DIAGNOSIS — M79674 Pain in right toe(s): Secondary | ICD-10-CM | POA: Diagnosis not present

## 2023-07-11 DIAGNOSIS — M79675 Pain in left toe(s): Secondary | ICD-10-CM | POA: Diagnosis not present

## 2023-07-11 NOTE — Progress Notes (Addendum)
 This patient returns to my office for at risk foot care.  This patient requires this care by a professional since this patient will be at risk due to having diabetes and pvd.  This patient is unable to cut nails herself since the patient cannot reach her nails.These nails are painful walking and wearing shoes.  This patient presents for at risk foot care today.  General Appearance  Alert, conversant and in no acute stress.  Vascular  Dorsalis pedis and posterior tibial  pulses are  not palpable  bilaterally.  Capillary return is within normal limits  bilaterally. Temperature is within normal limits  bilaterally.  Neurologic  Senn-Weinstein monofilament wire test within normal limits  bilaterally. Muscle power within normal limits bilaterally.  Nails Thick disfigured discolored nails with subungual debris  from hallux to fifth toes bilaterally. No evidence of bacterial infection or drainage bilaterally.  Orthopedic  No limitations of motion  feet .  No crepitus or effusions noted.  No bony pathology or digital deformities noted.  Skin  normotropic skin with no porokeratosis noted bilaterally.  No signs of infections or ulcers noted.     Onychomycosis  Pain in right toes  Pain in left toes  Consent was obtained for treatment procedures.   Mechanical debridement of nails 1-5  bilaterally performed with a nail nipper.  Filed with dremel without incident.  Nails were done carefully since she has nail polish on her nails.  Cauterize her fourth toenail left.   Return office visit  10 weeks                    Told patient to return for periodic foot care and evaluation due to potential at risk complications.   Ruffin Cotton DPM

## 2023-07-12 ENCOUNTER — Telehealth: Payer: Self-pay

## 2023-07-12 ENCOUNTER — Telehealth: Payer: Self-pay | Admitting: Internal Medicine

## 2023-07-12 ENCOUNTER — Other Ambulatory Visit (HOSPITAL_COMMUNITY): Payer: Self-pay

## 2023-07-12 NOTE — Telephone Encounter (Signed)
 Pt c/o medication issue:  1. Name of Medication:   Evolocumab  (REPATHA  SURECLICK) 140 MG/ML SOAJ   2. How are you currently taking this medication (dosage and times per day)?   As prescribed  3. Are you having a reaction (difficulty breathing--STAT)?   4. What is your medication issue?   Patient stated her insurance company was faxing over information for her to get this medication at a reduced cost.  Patient stated she is completely out of this medication now.

## 2023-07-12 NOTE — Telephone Encounter (Signed)
 Pt called to let us  know she had ABIs in our office recently, which were ordered by her podiatrist who she f/u with yesterday. He told her she had absent pulses in BLE. His note does not state to f/u with us , as it states she is to return to him in 10 weeks. I do not see a referral entered. She saw Dr. Nolene Baumgarten over 3 years ago. I have advised her to f/u with podiatrists for a referral. She verbalized understanding.

## 2023-07-12 NOTE — Telephone Encounter (Signed)
.  Pharmacy Patient Advocate Encounter  Received notification from [employer] that TIER EXCEPTION for REPATHA  has been DENIED.  Full denial letter will be uploaded to the media tab. See denial reason below.

## 2023-07-12 NOTE — Telephone Encounter (Signed)
 Pharmacy Patient Advocate Encounter   Received notification from Fax that TIER EXCEPTION for REPATHA  is required/requested.   Insurance verification completed.   The patient is insured through [employer] .   Per test claim: PA required; PA submitted to above mentioned insurance via Fax Key/confirmation #/[id number] Status is pending

## 2023-07-12 NOTE — Telephone Encounter (Signed)
 Pharmacy Patient Advocate Encounter   Received notification from Fax that prior authorization for REPATHA  is required/requested.   Insurance verification completed.   The patient is insured through [employer] .   Per test claim: Refill too soon. PA is not needed at this time. Medication was filled 07/11/23. Next eligible fill date is 08/01/23.

## 2023-07-12 NOTE — Telephone Encounter (Signed)
 Completed and faxed to plan.

## 2023-07-13 ENCOUNTER — Ambulatory Visit
Admission: RE | Admit: 2023-07-13 | Discharge: 2023-07-13 | Disposition: A | Discharge status: Home / Self Care | Source: Ambulatory Visit | Attending: Internal Medicine | Admitting: Internal Medicine

## 2023-07-13 ENCOUNTER — Ambulatory Visit

## 2023-07-13 DIAGNOSIS — Z1231 Encounter for screening mammogram for malignant neoplasm of breast: Secondary | ICD-10-CM

## 2023-07-18 ENCOUNTER — Telehealth (HOSPITAL_COMMUNITY): Payer: Self-pay

## 2023-07-18 NOTE — Telephone Encounter (Signed)
 Called patient to see if she was interested in participating in the Cardiac Rehab Program. Patient will come in for orientation on 07/26/23 @ 1:15PM and will attend the 12:30PM exercise class.   Pensions consultant.

## 2023-07-18 NOTE — Telephone Encounter (Signed)
 Pt insurance is active and benefits verified through [facility]. Co-pay $20.00, DED $0.00/$0.00 met, out of pocket $4,000.00/$615.12 met, co-insurance 0%. No pre-authorization required. [employer]port, 07/18/23 @ 10:48AM, REF#[id number]   How many CR sessions are covered? (36 visits for TCR, 72 visits for ICR)72 Is this a lifetime maximum or an annual maximum? Annual Has the member used any of these services to date? No Is there a time limit (weeks/months) on start of program and/or program completion? No

## 2023-07-25 ENCOUNTER — Telehealth (HOSPITAL_COMMUNITY): Payer: Self-pay | Admitting: *Deleted

## 2023-07-25 NOTE — Telephone Encounter (Signed)
 Spoke with Ms Schrimpf confirmed appointment for cardiac rheab orientation tomorrow. Asked patient to bring medication last and wear comfortable shoes/ clothing for 6 minute walk test.Esther Bradstreet Jomarie Neer RN BSN

## 2023-07-26 ENCOUNTER — Encounter (HOSPITAL_COMMUNITY)
Admission: RE | Admit: 2023-07-26 | Discharge: 2023-07-26 | Disposition: A | Discharge status: Home / Self Care | Source: Ambulatory Visit | Attending: Internal Medicine | Admitting: Internal Medicine

## 2023-07-26 ENCOUNTER — Telehealth: Payer: Self-pay | Admitting: Pharmacy Technician

## 2023-07-26 VITALS — BP 140/64 | HR 55 | Ht 64.75 in | Wt 222.0 lb

## 2023-07-26 DIAGNOSIS — I213 ST elevation (STEMI) myocardial infarction of unspecified site: Secondary | ICD-10-CM | POA: Insufficient documentation

## 2023-07-26 DIAGNOSIS — E162 Hypoglycemia, unspecified: Secondary | ICD-10-CM | POA: Insufficient documentation

## 2023-07-26 DIAGNOSIS — Z955 Presence of coronary angioplasty implant and graft: Secondary | ICD-10-CM | POA: Insufficient documentation

## 2023-07-26 NOTE — Progress Notes (Signed)
 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  Patient's Home Medications on Admission:  Current Outpatient Medications:    Accu-Chek Softclix Lancets lancets, USE TO CHECK BLOOD SUGARS ONCE DAILY., Disp: 100 each, Rfl: 12   albuterol  (VENTOLIN  HFA) 108 (90 Base) MCG/ACT inhaler, Inhale 2 puffs into the lungs every 6 (six) hours as needed for wheezing or shortness of breath., Disp: 8 g, Rfl: 2   BD PEN NEEDLE NANO 2ND GEN 32G X 4 MM MISC, USE AS DIRECTED TWICE DAILY, Disp: 100 each, Rfl: 2   Blood Glucose Monitoring Suppl (ACCU-CHEK GUIDE) w/Device KIT, USE TO CHECK BLOOD SUGARS ONCE DAILY., Disp: 1 kit, Rfl: 0   budesonide -formoterol  (SYMBICORT ) 160-4.5 MCG/ACT inhaler, Inhale 2 puffs into the lungs 2 (two) times daily., Disp: 10.2 g, Rfl: 11   Cholecalciferol (VITAMIN D3) 2000 units TABS, Take 1 tablet by mouth daily., Disp: , Rfl:    colchicine  0.6 MG tablet, Take 1 tablet (0.6 mg total) by mouth as needed (for gout)., Disp: 30 tablet, Rfl: 1   diltiazem  (CARDIZEM  CD) 180 MG 24 hr capsule, Take 1 capsule (180 mg total) by mouth daily., Disp: 90 capsule, Rfl: 1   dorzolamide -timolol  (COSOPT ) 2-0.5 % ophthalmic solution, Place 1 drop into the right eye 2 (two) times daily., Disp: , Rfl:    ELIQUIS  5 MG TABS tablet, TAKE 1 TABLET(5 MG) BY MOUTH TWICE DAILY, Disp: 180 tablet, Rfl: 1   Evolocumab  (REPATHA  SURECLICK) 140 MG/ML SOAJ, Inject 140 mg into the skin every 14 (fourteen) days.,  Disp: 6 mL, Rfl: 3   fluticasone  (FLONASE ) 50 MCG/ACT nasal spray, Place 2 sprays into both nostrils daily., Disp: 16 g, Rfl: 2   furosemide  (LASIX ) 20 MG tablet, TAKE 1 TABLET(20 MG) BY MOUTH DAILY, Disp: 30 tablet, Rfl: 10   glucose blood (ACCU-CHEK GUIDE) test strip, USE TO CHECK BLOOD SUGARS ONCE DAILY., Disp: 100 each, Rfl: 12   insulin  aspart protamine - aspart (NOVOLOG  MIX 70/30 FLEXPEN) (70-30) 100 UNIT/ML FlexPen, INJECT 40 UNITS SUBCUTANEOUS EVERY MORNING AND 50 UNITS SUBCUTANEOUS AT DINNER (Patient taking differently: INJECT 45 UNITS SUBCUTANEOUS EVERY MORNING AND 50 UNITS SUBCUTANEOUS AT DINNER), Disp: 30 mL, Rfl: 2   isosorbide  mononitrate (IMDUR ) 60 MG 24 hr tablet, Take 1 tablet (60 mg total) by mouth daily. Pt takes 1 tablet by mouth daily., Disp: 30 tablet, Rfl: 7   Latanoprostene Bunod  (VYZULTA ) 0.024 % SOLN, Place 1 drop into the right eye at bedtime., Disp: , Rfl:    metoprolol  succinate (TOPROL -XL) 100 MG 24 hr tablet, Take 1.5 tablets (150 mg total) by mouth daily. Take with or immediately following a meal., Disp: 135 tablet, Rfl: 3   nitroGLYCERIN  (NITROSTAT ) 0.4 MG SL tablet, Place 1 tablet (0.4 mg total) under the tongue every 5 (five) minutes as needed for chest pain., Disp: 25 tablet, Rfl: 11   olmesartan  (BENICAR ) 40 MG tablet, TAKE 1 TABLET(40 MG) BY MOUTH DAILY, Disp: 90 tablet, Rfl: 2   ONETOUCH VERIO  test strip, USE TO CHECK BLOOD SUGARS TWICE DAILY AS DIRECTED, Disp: 100 strip, Rfl: 3   pantoprazole  (PROTONIX ) 40 MG tablet, TAKE 1 TABLET(40 MG) BY MOUTH DAILY, Disp: 90 tablet, Rfl: 1   potassium chloride  (KLOR-CON ) 10 MEQ tablet, Take 1 tablet (10 mEq total) by mouth at bedtime., Disp: , Rfl:    Semaglutide ,0.25 or 0.5MG /DOS, (OZEMPIC , 0.25 OR 0.5 MG/DOSE,) 2 MG/1.5ML SOPN, INJECT 0.5MG  INTO SKIN ONCE A WEEK, Disp: 4.5 mL, Rfl: 3   spironolactone  (ALDACTONE ) 25 MG tablet, Take 1 tablet (25 mg total) by mouth daily., Disp: 90 tablet, Rfl: 2  Past Medical History: Past  Medical History:  Diagnosis Date   Asthma    Atrial fibrillation (HCC)    CKD (chronic kidney disease)    Coronary artery disease    Diabetes mellitus without complication (HCC)    GERD (gastroesophageal reflux disease)    Heart attack (HCC) 12/2022   Hypertension    MI (myocardial infarction) (HCC)    Peripheral vascular disease (HCC)    Sleep apnea     Tobacco Use: Social History   Tobacco Use  Smoking Status Former   Current packs/day: 0.00   Average packs/day: 0.3 packs/day for 45.0 years (11.3 ttl pk-yrs)   Types: Cigarettes   Start date: 01/11/1968   Quit date: 01/10/2013   Years since quitting: 10.5   Passive exposure: Never  Smokeless Tobacco Never  Tobacco Comments   Former smoker 06/24/23    Labs: Review Flowsheet  More data exists      Latest Ref Rng & Units 08/05/2022 10/21/2022 11/30/2022 12/01/2022 04/21/2023  Labs for ITP Cardiac and Pulmonary Rehab  Cholestrol 100 - 199 mg/dL - - 355  732  202   LDL (calc) 0 - 99 mg/dL - - 542  706  57   HDL-C >39 mg/dL - - 40  36  53   Trlycerides 0 - 149 mg/dL - - 237  628  92   Hemoglobin A1c 4.8 - 5.6 % 6.8  6.9  7.1  - 6.1     Capillary Blood Glucose: Lab Results  Component Value Date   GLUCAP 147 (H) 06/16/2023   GLUCAP 94 06/16/2023   GLUCAP 96 06/16/2023   GLUCAP 169 (H) 12/04/2022   GLUCAP 136 (H) 12/03/2022     Exercise Target Goals: Exercise Program Goal: Individual exercise prescription set using results from initial 6 min walk test and THRR while considering  patient's activity barriers and safety.   Exercise Prescription Goal: Initial exercise prescription builds to 30-45 minutes a day of aerobic activity, 2-3 days per week.  Home exercise guidelines will be given to patient during program as part of exercise prescription that the participant will acknowledge.  Activity Barriers & Risk Stratification:  Activity Barriers & Cardiac Risk Stratification - 07/26/23 1306       Activity Barriers &  Cardiac Risk Stratification   Activity Barriers History of Falls;Balance Concerns;Shortness of Breath;Other (comment)    Comments PVD, poor circulation in legs. Some back pain with walking.    Cardiac Risk Stratification High             6 Minute Walk:  6 Minute Walk     Row Name 07/26/23 1404         6 Minute Walk   Phase Initial     Distance 960 feet     Walk Time 6 minutes     # of Rest Breaks 0     MPH  1.82     METS 1.45     RPE 11     Perceived Dyspnea  2     VO2 Peak 5.09     Symptoms Yes (comment)     Comments Mild shortness of breath. Soreness in both arms from pushing rollator, soreness in left knee, hx of knee surgery.     Resting HR 55 bpm     Resting BP 140/64     Resting Oxygen Saturation  95 %     Exercise Oxygen Saturation  during 6 min walk 96 %     Max Ex. HR 85 bpm     Max Ex. BP 178/72     2 Minute Post BP 138/70              Oxygen Initial Assessment:   Oxygen Re-Evaluation:   Oxygen Discharge (Final Oxygen Re-Evaluation):   Initial Exercise Prescription:  Initial Exercise Prescription - 07/26/23 1400       Date of Initial Exercise RX and Referring Provider   Date 07/26/23    Referring Provider Thukkani, Arun K, MD    Expected Discharge Date 10/19/23      NuStep   Level 1    SPM 85    Minutes 15    METs 1.5      Track   Laps 11    Minutes 15    METs 2.4      Prescription Details   Frequency (times per week) 3    Duration Progress to 30 minutes of continuous aerobic without signs/symptoms of physical distress      Intensity   THRR 40-80% of Max Heartrate 57-114    Ratings of Perceived Exertion 11-13    Perceived Dyspnea 0-4      Progression   Progression Continue to progress workloads to maintain intensity without signs/symptoms of physical distress.      Resistance Training   Training Prescription Yes    Weight 2 lbs    Reps 10-15             Perform Capillary Blood Glucose checks as  needed.  Exercise Prescription Changes:   Exercise Comments:   Exercise Goals and Review:   Exercise Goals     Row Name 07/26/23 1306             Exercise Goals   Increase Physical Activity Yes       Intervention Provide advice, education, support and counseling about physical activity/exercise needs.;Develop an individualized exercise prescription for aerobic and resistive training based on initial evaluation findings, risk stratification, comorbidities and participant's personal goals.       Expected Outcomes Short Term: Attend rehab on a regular basis to increase amount of physical activity.;Long Term: Exercising regularly at least 3-5 days a week.;Long Term: Add in home exercise to make exercise part of routine and to increase amount of physical activity.       Increase Strength and Stamina Yes       Intervention Provide advice, education, support and counseling about physical activity/exercise needs.;Develop an individualized exercise prescription for aerobic and resistive training based on initial evaluation findings, risk stratification, comorbidities and participant's personal goals.       Expected Outcomes Short Term: Increase workloads from initial exercise prescription for resistance, speed, and METs.;Short Term: Perform resistance training exercises routinely during rehab and add in resistance training at home;Long Term: Improve cardiorespiratory fitness, muscular endurance and strength as measured by increased METs and functional capacity ( )  Able to understand and use rate of perceived exertion (RPE) scale Yes       Intervention Provide education and explanation on how to use RPE scale       Expected Outcomes Short Term: Able to use RPE daily in rehab to express subjective intensity level;Long Term:  Able to use RPE to guide intensity level when exercising independently       Knowledge and understanding of Target Heart Rate Range (THRR) Yes       Intervention  Provide education and explanation of THRR including how the numbers were predicted and where they are located for reference       Expected Outcomes Short Term: Able to state/look up THRR;Long Term: Able to use THRR to govern intensity when exercising independently;Short Term: Able to use daily as guideline for intensity in rehab       Able to check pulse independently Yes       Intervention Provide education and demonstration on how to check pulse in carotid and radial arteries.;Review the importance of being able to check your own pulse for safety during independent exercise       Expected Outcomes Short Term: Able to explain why pulse checking is important during independent exercise;Long Term: Able to check pulse independently and accurately       Understanding of Exercise Prescription Yes       Intervention Provide education, explanation, and written materials on patient's individual exercise prescription       Expected Outcomes Short Term: Able to explain program exercise prescription;Long Term: Able to explain home exercise prescription to exercise independently                Exercise Goals Re-Evaluation :   Discharge Exercise Prescription (Final Exercise Prescription Changes):   Nutrition:  Target Goals: Understanding of nutrition guidelines, daily intake of sodium 1500mg , cholesterol 200mg , calories 30% from fat and 7% or less from saturated fats, daily to have 5 or more servings of fruits and vegetables.  Biometrics:  Pre Biometrics - 07/26/23 1259       Pre Biometrics   Waist Circumference 47 inches    Hip Circumference 51.5 inches    Waist to Hip Ratio 0.91 %    Triceps Skinfold 55 mm    % Body Fat 52.7 %    Grip Strength 20 kg    Flexibility 12.5 in    Single Leg Stand 2.93 seconds              Nutrition Therapy Plan and Nutrition Goals:   Nutrition Assessments:  MEDIFICTS Score Key: >=70 Need to make dietary changes  40-70 Heart Healthy Diet <= 40  Therapeutic Level Cholesterol Diet    Picture Your Plate Scores: <09 Unhealthy dietary pattern with much room for improvement. 41-50 Dietary pattern unlikely to meet recommendations for good health and room for improvement. 51-60 More healthful dietary pattern, with some room for improvement.  >60 Healthy dietary pattern, although there may be some specific behaviors that could be improved.    Nutrition Goals Re-Evaluation:   Nutrition Goals Re-Evaluation:   Nutrition Goals Discharge (Final Nutrition Goals Re-Evaluation):   Psychosocial: Target Goals: Acknowledge presence or absence of significant depression and/or stress, maximize coping skills, provide positive support system. Participant is able to verbalize types and ability to use techniques and skills needed for reducing stress and depression.  Initial Review & Psychosocial Screening:  Initial Psych Review & Screening - 07/26/23 1336       Initial  Review   Current issues with Current Depression;Current Stress Concerns    Source of Stress Concerns Unable to participate in former interests or hobbies;Retirement/disability;Chronic Illness    Comments Sinaya has stress/ depression because of health issues since retirement. Not able to do everything she would like to do.      Family Dynamics   Good Support System? Yes    Comments Support from her 2 daughters. Talks to son-law who's a Education officer, environmental. Talks to PCP.      Barriers   Psychosocial barriers to participate in program The patient should benefit from training in stress management and relaxation.      Screening Interventions   Interventions Encouraged to exercise;Provide feedback about the scores to participant    Expected Outcomes Short Term goal: Utilizing psychosocial counselor, staff and physician to assist with identification of specific Stressors or current issues interfering with healing process. Setting desired goal for each stressor or current issue identified.;Long  Term Goal: Stressors or current issues are controlled or eliminated.;Short Term goal: Identification and review with participant of any Quality of Life or Depression concerns found by scoring the questionnaire.;Long Term goal: The participant improves quality of Life and PHQ9 Scores as seen by post scores and/or verbalization of changes             Quality of Life Scores:  Scores of 19 and below usually indicate a poorer quality of life in these areas.  A difference of  2-3 points is a clinically meaningful difference.  A difference of 2-3 points in the total score of the Quality of Life Index has been associated with significant improvement in overall quality of life, self-image, physical symptoms, and general health in studies assessing change in quality of life.  PHQ-9: Review Flowsheet  More data exists      07/26/2023 04/21/2023 10/21/2022 05/04/2022 10/20/2021  Depression screen PHQ 2/9  Decreased Interest 2 0 0 0 2  Down, Depressed, Hopeless 3 0 3 0 0  PHQ - 2 Score 5 0 3 0 2  Altered sleeping 0 0 3 - 3  Tired, decreased energy 3 0 3 - 3  Change in appetite 0 0 0 - 0  Feeling bad or failure about yourself  1 0 0 - 0  Trouble concentrating 0 0 0 - 0  Moving slowly or fidgety/restless 2 0 0 - 0  Suicidal thoughts 0 0 0 - 0  PHQ-9 Score 11 0 9 - 8  Difficult doing work/chores Somewhat difficult Not difficult at all Somewhat difficult - Somewhat difficult   Interpretation of Total Score  Total Score Depression Severity:  1-4 = Minimal depression, 5-9 = Mild depression, 10-14 = Moderate depression, 15-19 = Moderately severe depression, 20-27 = Severe depression   Psychosocial Evaluation and Intervention:   Psychosocial Re-Evaluation:   Psychosocial Discharge (Final Psychosocial Re-Evaluation):   Vocational Rehabilitation: Provide vocational rehab assistance to qualifying candidates.   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             Education: Education Goals: Education classes will be provided on a weekly basis, covering required topics. Participant will state understanding/return demonstration of topics presented.     Core Videos: Exercise  Move It!  Clinical staff conducted group or individual video education with verbal and written material and guidebook.  Patient learns the recommended Pritikin exercise program. Exercise with the goal of living a long, healthy life. Some of the health benefits of exercise include controlled diabetes, healthier blood pressure levels, improved cholesterol levels, improved heart and lung capacity, improved sleep, and better body composition. Everyone should speak with their doctor before starting or changing an exercise routine.  Biomechanical Limitations Clinical staff conducted group or individual video education with verbal and written material and guidebook.  Patient learns how biomechanical limitations can impact exercise and how we can mitigate and possibly overcome limitations to have an impactful and balanced exercise routine.  Body Composition Clinical staff conducted group or individual video education with verbal and written material and guidebook.  Patient learns that body composition (ratio of muscle mass to fat mass) is a key component to assessing overall fitness, rather than body weight alone. Increased fat mass, especially visceral belly fat, can put us  at increased risk for metabolic syndrome, type 2 diabetes, heart disease, and even death. It is recommended to combine diet and exercise (cardiovascular and resistance training) to improve your body composition. Seek guidance from your physician and exercise physiologist before  implementing an exercise routine.  Exercise Action Plan Clinical staff conducted group or individual video education with verbal and written material and guidebook.  Patient learns the recommended strategies to achieve and enjoy long-term exercise adherence, including variety, self-motivation, self-efficacy, and positive decision making. Benefits of exercise include fitness, good health, weight management, more energy, better sleep, less stress, and overall well-being.  Medical   Heart Disease Risk Reduction Clinical staff conducted group or individual video education with verbal and written material and guidebook.  Patient learns our heart is our most vital organ as it circulates oxygen, nutrients, white blood cells, and hormones throughout the entire body, and carries waste away. Data supports a plant-based eating plan like the Pritikin Program for its effectiveness in slowing progression of and reversing heart disease. The video provides a number of recommendations to address heart disease.   Metabolic Syndrome and Belly Fat  Clinical staff conducted group or individual video education with verbal and written material and guidebook.  Patient learns what metabolic syndrome is, how it leads to heart disease, and how one can reverse it and keep it from coming back. You have metabolic syndrome if you have 3 of the following 5 criteria: abdominal obesity, high blood pressure, high triglycerides, low HDL cholesterol, and high blood sugar.  Hypertension and Heart Disease Clinical staff conducted group or individual video education with verbal and written material and guidebook.  Patient learns that high blood pressure, or hypertension, is very common in the [place] . Hypertension is largely due to excessive salt intake, but other important risk factors include being overweight, physical inactivity, drinking too much alcohol, smoking, and not eating enough potassium from fruits and vegetables. High  blood pressure is a leading risk factor for heart attack, stroke, congestive heart failure, dementia, kidney failure, and premature death. Long-term effects of excessive salt intake include stiffening of the arteries and thickening of heart muscle and organ damage. Recommendations include ways to reduce hypertension and the risk of heart disease.  Diseases of Our Time - Focusing on Diabetes Clinical staff conducted group or individual video education with verbal and written material and guidebook.  Patient learns why the best way to stop diseases of our time is prevention, through food and other  lifestyle changes. Medicine (such as prescription pills and surgeries) is often only a Band-Aid on the problem, not a long-term solution. Most common diseases of our time include obesity, type 2 diabetes, hypertension, heart disease, and cancer. The Pritikin Program is recommended and has been proven to help reduce, reverse, and/or prevent the damaging effects of metabolic syndrome.  Nutrition   Overview of the Pritikin Eating Plan  Clinical staff conducted group or individual video education with verbal and written material and guidebook.  Patient learns about the Pritikin Eating Plan for disease risk reduction. The Pritikin Eating Plan emphasizes a wide variety of unrefined, minimally-processed carbohydrates, like fruits, vegetables, whole grains, and legumes. Go, Caution, and Stop food choices are explained. Plant-based and lean animal proteins are emphasized. Rationale provided for low sodium intake for blood pressure control, low added sugars for blood sugar stabilization, and low added fats and oils for coronary artery disease risk reduction and weight management.  Calorie Density  Clinical staff conducted group or individual video education with verbal and written material and guidebook.  Patient learns about calorie density and how it impacts the Pritikin Eating Plan. Knowing the characteristics of the  food you choose will help you decide whether those foods will lead to weight gain or weight loss, and whether you want to consume more or less of them. Weight loss is usually a side effect of the Pritikin Eating Plan because of its focus on low calorie-dense foods.  Label Reading  Clinical staff conducted group or individual video education with verbal and written material and guidebook.  Patient learns about the Pritikin recommended label reading guidelines and corresponding recommendations regarding calorie density, added sugars, sodium content, and whole grains.  Dining Out - Part 1  Clinical staff conducted group or individual video education with verbal and written material and guidebook.  Patient learns that restaurant meals can be sabotaging because they can be so high in calories, fat, sodium, and/or sugar. Patient learns recommended strategies on how to positively address this and avoid unhealthy pitfalls.  Facts on Fats  Clinical staff conducted group or individual video education with verbal and written material and guidebook.  Patient learns that lifestyle modifications can be just as effective, if not more so, as many medications for lowering your risk of heart disease. A Pritikin lifestyle can help to reduce your risk of inflammation and atherosclerosis (cholesterol build-up, or plaque, in the artery walls). Lifestyle interventions such as dietary choices and physical activity address the cause of atherosclerosis. A review of the types of fats and their impact on blood cholesterol levels, along with dietary recommendations to reduce fat intake is also included.  Nutrition Action Plan  Clinical staff conducted group or individual video education with verbal and written material and guidebook.  Patient learns how to incorporate Pritikin recommendations into their lifestyle. Recommendations include planning and keeping personal health goals in mind as an important part of their  success.  Healthy Mind-Set    Healthy Minds, Bodies, Hearts  Clinical staff conducted group or individual video education with verbal and written material and guidebook.  Patient learns how to identify when they are stressed. Video will discuss the impact of that stress, as well as the many benefits of stress management. Patient will also be introduced to stress management techniques. The way we think, act, and feel has an impact on our hearts.  How Our Thoughts Can Heal Our Hearts  Clinical staff conducted group or individual video education with verbal and written material  and guidebook.  Patient learns that negative thoughts can cause depression and anxiety. This can result in negative lifestyle behavior and serious health problems. Cognitive behavioral therapy is an effective method to help control our thoughts in order to change and improve our emotional outlook.  Additional Videos:  Exercise    Improving Performance  Clinical staff conducted group or individual video education with verbal and written material and guidebook.  Patient learns to use a non-linear approach by alternating intensity levels and lengths of time spent exercising to help burn more calories and lose more body fat. Cardiovascular exercise helps improve heart health, metabolism, hormonal balance, blood sugar control, and recovery from fatigue. Resistance training improves strength, endurance, balance, coordination, reaction time, metabolism, and muscle mass. Flexibility exercise improves circulation, posture, and balance. Seek guidance from your physician and exercise physiologist before implementing an exercise routine and learn your capabilities and proper form for all exercise.  Introduction to Yoga  Clinical staff conducted group or individual video education with verbal and written material and guidebook.  Patient learns about yoga, a discipline of the coming together of mind, breath, and body. The benefits of yoga  include improved flexibility, improved range of motion, better posture and core strength, increased lung function, weight loss, and positive self-image. Yoga's heart health benefits include lowered blood pressure, healthier heart rate, decreased cholesterol and triglyceride levels, improved immune function, and reduced stress. Seek guidance from your physician and exercise physiologist before implementing an exercise routine and learn your capabilities and proper form for all exercise.  Medical   Aging: Enhancing Your Quality of Life  Clinical staff conducted group or individual video education with verbal and written material and guidebook.  Patient learns key strategies and recommendations to stay in good physical health and enhance quality of life, such as prevention strategies, having an advocate, securing a Health Care Proxy and Power of Attorney, and keeping a list of medications and system for tracking them. It also discusses how to avoid risk for bone loss.  Biology of Weight Control  Clinical staff conducted group or individual video education with verbal and written material and guidebook.  Patient learns that weight gain occurs because we consume more calories than we burn (eating more, moving less). Even if your body weight is normal, you may have higher ratios of fat compared to muscle mass. Too much body fat puts you at increased risk for cardiovascular disease, heart attack, stroke, type 2 diabetes, and obesity-related cancers. In addition to exercise, following the Pritikin Eating Plan can help reduce your risk.  Decoding Lab Results  Clinical staff conducted group or individual video education with verbal and written material and guidebook.  Patient learns that lab test reflects one measurement whose values change over time and are influenced by many factors, including medication, stress, sleep, exercise, food, hydration, pre-existing medical conditions, and more. It is recommended to  use the knowledge from this video to become more involved with your lab results and evaluate your numbers to speak with your doctor.   Diseases of Our Time - Overview  Clinical staff conducted group or individual video education with verbal and written material and guidebook.  Patient learns that according to the CDC, 50% to 70% of chronic diseases (such as obesity, type 2 diabetes, elevated lipids, hypertension, and heart disease) are avoidable through lifestyle improvements including healthier food choices, listening to satiety cues, and increased physical activity.  Sleep Disorders Clinical staff conducted group or individual video education with verbal and written  material and guidebook.  Patient learns how good quality and duration of sleep are important to overall health and well-being. Patient also learns about sleep disorders and how they impact health along with recommendations to address them, including discussing with a physician.  Nutrition  Dining Out - Part 2 Clinical staff conducted group or individual video education with verbal and written material and guidebook.  Patient learns how to plan ahead and communicate in order to maximize their dining experience in a healthy and nutritious manner. Included are recommended food choices based on the type of restaurant the patient is visiting.   Fueling a Banker conducted group or individual video education with verbal and written material and guidebook.  There is a strong connection between our food choices and our health. Diseases like obesity and type 2 diabetes are very prevalent and are in large-part due to lifestyle choices. The Pritikin Eating Plan provides plenty of food and hunger-curbing satisfaction. It is easy to follow, affordable, and helps reduce health risks.  Menu Workshop  Clinical staff conducted group or individual video education with verbal and written material and guidebook.  Patient learns  that restaurant meals can sabotage health goals because they are often packed with calories, fat, sodium, and sugar. Recommendations include strategies to plan ahead and to communicate with the manager, chef, or server to help order a healthier meal.  Planning Your Eating Strategy  Clinical staff conducted group or individual video education with verbal and written material and guidebook.  Patient learns about the Pritikin Eating Plan and its benefit of reducing the risk of disease. The Pritikin Eating Plan does not focus on calories. Instead, it emphasizes high-quality, nutrient-rich foods. By knowing the characteristics of the foods, we choose, we can determine their calorie density and make informed decisions.  Targeting Your Nutrition Priorities  Clinical staff conducted group or individual video education with verbal and written material and guidebook.  Patient learns that lifestyle habits have a tremendous impact on disease risk and progression. This video provides eating and physical activity recommendations based on your personal health goals, such as reducing LDL cholesterol, losing weight, preventing or controlling type 2 diabetes, and reducing high blood pressure.  Vitamins and Minerals  Clinical staff conducted group or individual video education with verbal and written material and guidebook.  Patient learns different ways to obtain key vitamins and minerals, including through a recommended healthy diet. It is important to discuss all supplements you take with your doctor.   Healthy Mind-Set    Smoking Cessation  Clinical staff conducted group or individual video education with verbal and written material and guidebook.  Patient learns that cigarette smoking and tobacco addiction pose a serious health risk which affects millions of people. Stopping smoking will significantly reduce the risk of heart disease, lung disease, and many forms of cancer. Recommended strategies for quitting  are covered, including working with your doctor to develop a successful plan.  Culinary   Becoming a Set designer conducted group or individual video education with verbal and written material and guidebook.  Patient learns that cooking at home can be healthy, cost-effective, quick, and puts them in control. Keys to cooking healthy recipes will include looking at your recipe, assessing your equipment needs, planning ahead, making it simple, choosing cost-effective seasonal ingredients, and limiting the use of added fats, salts, and sugars.  Cooking - Breakfast and Snacks  Clinical staff conducted group or individual video education with verbal and written  material and guidebook.  Patient learns how important breakfast is to satiety and nutrition through the entire day. Recommendations include key foods to eat during breakfast to help stabilize blood sugar levels and to prevent overeating at meals later in the day. Planning ahead is also a key component.  Cooking - Educational psychologist conducted group or individual video education with verbal and written material and guidebook.  Patient learns eating strategies to improve overall health, including an approach to cook more at home. Recommendations include thinking of animal protein as a side on your plate rather than center stage and focusing instead on lower calorie dense options like vegetables, fruits, whole grains, and plant-based proteins, such as beans. Making sauces in large quantities to freeze for later and leaving the skin on your vegetables are also recommended to maximize your experience.  Cooking - Healthy Salads and Dressing Clinical staff conducted group or individual video education with verbal and written material and guidebook.  Patient learns that vegetables, fruits, whole grains, and legumes are the foundations of the Pritikin Eating Plan. Recommendations include how to incorporate each of these in  flavorful and healthy salads, and how to create homemade salad dressings. Proper handling of ingredients is also covered. Cooking - Soups and [employer] - Soups and Desserts Clinical staff conducted group or individual video education with verbal and written material and guidebook.  Patient learns that Pritikin soups and desserts make for easy, nutritious, and delicious snacks and meal components that are low in sodium, fat, sugar, and calorie density, while high in vitamins, minerals, and filling fiber. Recommendations include simple and healthy ideas for soups and desserts.   Overview     The Pritikin Solution Program Overview Clinical staff conducted group or individual video education with verbal and written material and guidebook.  Patient learns that the results of the Pritikin Program have been documented in more than 100 articles published in peer-reviewed journals, and the benefits include reducing risk factors for (and, in some cases, even reversing) high cholesterol, high blood pressure, type 2 diabetes, obesity, and more! An overview of the three key pillars of the Pritikin Program will be covered: eating well, doing regular exercise, and having a healthy mind-set.  WORKSHOPS  Exercise: Exercise Basics: Building Your Action Plan Clinical staff led group instruction and group discussion with PowerPoint presentation and patient guidebook. To enhance the learning environment the use of posters, models and videos may be added. At the conclusion of this workshop, patients will comprehend the difference between physical activity and exercise, as well as the benefits of incorporating both, into their routine. Patients will understand the FITT (Frequency, Intensity, Time, and Type) principle and how to use it to build an exercise action plan. In addition, safety concerns and other considerations for exercise and cardiac rehab will be addressed by the presenter. The purpose of this  lesson is to promote a comprehensive and effective weekly exercise routine in order to improve patients' overall level of fitness.   Managing Heart Disease: Your Path to a Healthier Heart Clinical staff led group instruction and group discussion with PowerPoint presentation and patient guidebook. To enhance the learning environment the use of posters, models and videos may be added.At the conclusion of this workshop, patients will understand the anatomy and physiology of the heart. Additionally, they will understand how Pritikin's three pillars impact the risk factors, the progression, and the management of heart disease.  The purpose of this lesson is to provide a  high-level overview of the heart, heart disease, and how the Pritikin lifestyle positively impacts risk factors.  Exercise Biomechanics Clinical staff led group instruction and group discussion with PowerPoint presentation and patient guidebook. To enhance the learning environment the use of posters, models and videos may be added. Patients will learn how the structural parts of their bodies function and how these functions impact their daily activities, movement, and exercise. Patients will learn how to promote a neutral spine, learn how to manage pain, and identify ways to improve their physical movement in order to promote healthy living. The purpose of this lesson is to expose patients to common physical limitations that impact physical activity. Participants will learn practical ways to adapt and manage aches and pains, and to minimize their effect on regular exercise. Patients will learn how to maintain good posture while sitting, walking, and lifting.  Balance Training and Fall Prevention  Clinical staff led group instruction and group discussion with PowerPoint presentation and patient guidebook. To enhance the learning environment the use of posters, models and videos may be added. At the conclusion of this workshop,  patients will understand the importance of their sensorimotor skills (vision, proprioception, and the vestibular system) in maintaining their ability to balance as they age. Patients will apply a variety of balancing exercises that are appropriate for their current level of function. Patients will understand the common causes for poor balance, possible solutions to these problems, and ways to modify their physical environment in order to minimize their fall risk. The purpose of this lesson is to teach patients about the importance of maintaining balance as they age and ways to minimize their risk of falling.  WORKSHOPS   Nutrition:  Fueling a Ship broker led group instruction and group discussion with PowerPoint presentation and patient guidebook. To enhance the learning environment the use of posters, models and videos may be added. Patients will review the foundational principles of the Pritikin Eating Plan and understand what constitutes a serving size in each of the food groups. Patients will also learn Pritikin-friendly foods that are better choices when away from home and review make-ahead meal and snack options. Calorie density will be reviewed and applied to three nutrition priorities: weight maintenance, weight loss, and weight gain. The purpose of this lesson is to reinforce (in a group setting) the key concepts around what patients are recommended to eat and how to apply these guidelines when away from home by planning and selecting Pritikin-friendly options. Patients will understand how calorie density may be adjusted for different weight management goals.  Mindful Eating  Clinical staff led group instruction and group discussion with PowerPoint presentation and patient guidebook. To enhance the learning environment the use of posters, models and videos may be added. Patients will briefly review the concepts of the Pritikin Eating Plan and the importance of low-calorie dense  foods. The concept of mindful eating will be introduced as well as the importance of paying attention to internal hunger signals. Triggers for non-hunger eating and techniques for dealing with triggers will be explored. The purpose of this lesson is to provide patients with the opportunity to review the basic principles of the Pritikin Eating Plan, discuss the value of eating mindfully and how to measure internal cues of hunger and fullness using the Hunger Scale. Patients will also discuss reasons for non-hunger eating and learn strategies to use for controlling emotional eating.  Targeting Your Nutrition Priorities Clinical staff led group instruction and group discussion with PowerPoint presentation  and patient guidebook. To enhance the learning environment the use of posters, models and videos may be added. Patients will learn how to determine their genetic susceptibility to disease by reviewing their family history. Patients will gain insight into the importance of diet as part of an overall healthy lifestyle in mitigating the impact of genetics and other environmental insults. The purpose of this lesson is to provide patients with the opportunity to assess their personal nutrition priorities by looking at their family history, their own health history and current risk factors. Patients will also be able to discuss ways of prioritizing and modifying the Pritikin Eating Plan for their highest risk areas  Menu  Clinical staff led group instruction and group discussion with PowerPoint presentation and patient guidebook. To enhance the learning environment the use of posters, models and videos may be added. Using menus brought in from [employer], or printed from [employer], patients will apply the Pritikin dining out guidelines that were presented in the [employer] video. Patients will also be able to practice these guidelines in a variety of provided scenarios. The purpose of  this lesson is to provide patients with the opportunity to practice hands-on learning of the Pritikin Dining Out guidelines with actual menus and practice scenarios.  Label Reading Clinical staff led group instruction and group discussion with PowerPoint presentation and patient guidebook. To enhance the learning environment the use of posters, models and videos may be added. Patients will review and discuss the Pritikin label reading guidelines presented in Pritikin's Label Reading Educational series video. Using fool labels brought in from local grocery stores and markets, patients will apply the label reading guidelines and determine if the packaged food meet the Pritikin guidelines. The purpose of this lesson is to provide patients with the opportunity to review, discuss, and practice hands-on learning of the Pritikin Label Reading guidelines with actual packaged food labels. Cooking School  Pritikin's [employer] are designed to teach patients ways to prepare quick, simple, and affordable recipes at home. The importance of nutrition's role in chronic disease risk reduction is reflected in its emphasis in the overall Pritikin program. By learning how to prepare essential core Pritikin Eating Plan recipes, patients will increase control over what they eat; be able to customize the flavor of foods without the use of added salt, sugar, or fat; and improve the quality of the food they consume. By learning a set of core recipes which are easily assembled, quickly prepared, and affordable, patients are more likely to prepare more healthy foods at home. These workshops focus on convenient breakfasts, simple entres, side dishes, and desserts which can be prepared with minimal effort and are consistent with nutrition recommendations for cardiovascular risk reduction. Cooking [employer] are taught by a Armed forces logistics/support/administrative officer (RD) who has been trained by the [employer]. The  chef or RD has a clear understanding of the importance of minimizing - if not completely eliminating - added fat, sugar, and sodium in recipes. Throughout the series of Cooking School Workshop sessions, patients will learn about healthy ingredients and efficient methods of cooking to build confidence in their capability to prepare    Cooking School weekly topics:  Adding Flavor- Sodium-Free  Fast and Healthy Breakfasts  Powerhouse Plant-Based Proteins  Satisfying Salads and Dressings  Simple Sides and Sauces  International Cuisine-Spotlight on the [employer] Zones  Delicious Desserts  Savory Soups  [employer] - Meals in a Snap  Tasty Appetizers and  Snacks  Comforting Weekend Breakfasts  One-Pot Wonders   Fast [employer] Your Pritikin Plate  WORKSHOPS   Healthy Mindset (Psychosocial):  Focused Goals, Sustainable Changes Clinical staff led group instruction and group discussion with PowerPoint presentation and patient guidebook. To enhance the learning environment the use of posters, models and videos may be added. Patients will be able to apply effective goal setting strategies to establish at least one personal goal, and then take consistent, meaningful action toward that goal. They will learn to identify common barriers to achieving personal goals and develop strategies to overcome them. Patients will also gain an understanding of how our mind-set can impact our ability to achieve goals and the importance of cultivating a positive and growth-oriented mind-set. The purpose of this lesson is to provide patients with a deeper understanding of how to set and achieve personal goals, as well as the tools and strategies needed to overcome common obstacles which may arise along the way.  From Head to Heart: The Power of a Healthy Outlook  Clinical staff led group instruction and group discussion with PowerPoint presentation and patient guidebook. To  enhance the learning environment the use of posters, models and videos may be added. Patients will be able to recognize and describe the impact of emotions and mood on physical health. They will discover the importance of self-care and explore self-care practices which may work for them. Patients will also learn how to utilize the 4 C's to cultivate a healthier outlook and better manage stress and challenges. The purpose of this lesson is to demonstrate to patients how a healthy outlook is an essential part of maintaining good health, especially as they continue their cardiac rehab journey.  Healthy Sleep for a Healthy Heart Clinical staff led group instruction and group discussion with PowerPoint presentation and patient guidebook. To enhance the learning environment the use of posters, models and videos may be added. At the conclusion of this workshop, patients will be able to demonstrate knowledge of the importance of sleep to overall health, well-being, and quality of life. They will understand the symptoms of, and treatments for, common sleep disorders. Patients will also be able to identify daytime and nighttime behaviors which impact sleep, and they will be able to apply these tools to help manage sleep-related challenges. The purpose of this lesson is to provide patients with a general overview of sleep and outline the importance of quality sleep. Patients will learn about a few of the most common sleep disorders. Patients will also be introduced to the concept of "sleep hygiene," and discover ways to self-manage certain sleeping problems through simple daily behavior changes. Finally, the workshop will motivate patients by clarifying the links between quality sleep and their goals of heart-healthy living.   Recognizing and Reducing Stress Clinical staff led group instruction and group discussion with PowerPoint presentation and patient guidebook. To enhance the learning environment the use of posters,  models and videos may be added. At the conclusion of this workshop, patients will be able to understand the types of stress reactions, differentiate between acute and chronic stress, and recognize the impact that chronic stress has on their health. They will also be able to apply different coping mechanisms, such as reframing negative self-talk. Patients will have the opportunity to practice a variety of stress management techniques, such as deep abdominal breathing, progressive muscle relaxation, and/or guided imagery.  The purpose of this lesson is to educate patients on the role of stress  in their lives and to provide healthy techniques for coping with it.  Learning Barriers/Preferences:  Learning Barriers/Preferences - 07/26/23 1327       Learning Barriers/Preferences   Learning Barriers Sight   glaucoma   Learning Preferences Computer/Internet             Education Topics:  Knowledge Questionnaire Score:   Core Components/Risk Factors/Patient Goals at Admission:  Personal Goals and Risk Factors at Admission - 07/26/23 1343       Core Components/Risk Factors/Patient Goals on Admission    Weight Management Yes;Obesity;Weight Loss    Intervention Weight Management/Obesity: Establish reasonable short term and long term weight goals.;Obesity: Provide education and appropriate resources to help participant work on and attain dietary goals.    Admit Weight 222 lb 0.1 oz (100.7 kg)    Expected Outcomes Short Term: Continue to assess and modify interventions until short term weight is achieved;Long Term: Adherence to nutrition and physical activity/exercise program aimed toward attainment of established weight goal;Weight Loss: Understanding of general recommendations for a balanced deficit meal plan, which promotes 1-2 lb weight loss per week and includes a negative energy balance of [phone] kcal/d    Diabetes Yes    Intervention Provide education about signs/symptoms and action to take  for hypo/hyperglycemia.;Provide education about proper nutrition, including hydration, and aerobic/resistive exercise prescription along with prescribed medications to achieve blood glucose in normal ranges: Fasting glucose 65-99 mg/dL    Expected Outcomes Short Term: Participant verbalizes understanding of the signs/symptoms and immediate care of hyper/hypoglycemia, proper foot care and importance of medication, aerobic/resistive exercise and nutrition plan for blood glucose control.;Long Term: Attainment of HbA1C < 7%.    Heart Failure Yes    Intervention Provide a combined exercise and nutrition program that is supplemented with education, support and counseling about heart failure. Directed toward relieving symptoms such as shortness of breath, decreased exercise tolerance, and extremity edema.    Expected Outcomes Improve functional capacity of life;Short term: Attendance in program 2-3 days a week with increased exercise capacity. Reported lower sodium intake. Reported increased fruit and vegetable intake. Reports medication compliance.;Short term: Daily weights obtained and reported for increase. Utilizing diuretic protocols set by physician.;Long term: Adoption of self-care skills and reduction of barriers for early signs and symptoms recognition and intervention leading to self-care maintenance.    Hypertension Yes    Intervention Monitor prescription use compliance.;Provide education on lifestyle modifcations including regular physical activity/exercise, weight management, moderate sodium restriction and increased consumption of fresh fruit, vegetables, and low fat dairy, alcohol moderation, and smoking cessation.    Expected Outcomes Short Term: Continued assessment and intervention until BP is < 140/52mm HG in hypertensive participants. < 130/48mm HG in hypertensive participants with diabetes, heart failure or chronic kidney disease.;Long Term: Maintenance of blood pressure at goal levels.     Lipids Yes    Intervention Provide education and support for participant on nutrition & aerobic/resistive exercise along with prescribed medications to achieve LDL 70mg , HDL >40mg .    Expected Outcomes Short Term: Participant states understanding of desired cholesterol values and is compliant with medications prescribed. Participant is following exercise prescription and nutrition guidelines.;Long Term: Cholesterol controlled with medications as prescribed, with individualized exercise RX and with personalized nutrition plan. Value goals: LDL < 70mg , HDL > 40 mg.    Stress Yes    Intervention Offer individual and/or small group education and counseling on adjustment to heart disease, stress management and health-related lifestyle change. Teach and support self-help strategies.;Refer participants  experiencing significant psychosocial distress to appropriate mental health specialists for further evaluation and treatment. When possible, include family members and significant others in education/counseling sessions.    Expected Outcomes Short Term: Participant demonstrates changes in health-related behavior, relaxation and other stress management skills, ability to obtain effective social support, and compliance with psychotropic medications if prescribed.;Long Term: Emotional wellbeing is indicated by absence of clinically significant psychosocial distress or social isolation.             Core Components/Risk Factors/Patient Goals Review:    Core Components/Risk Factors/Patient Goals at Discharge (Final Review):    ITP Comments:  ITP Comments     Row Name 07/26/23 1259           ITP Comments Medical Director- Dr. Gaylyn Keas, MD. Introduction to [facility] / Intensive Cardiac Rehab. Reviewed initial orientation folder.                Comments:Participant attended orientation for the cardiac rehabilitation program on  07/26/2023  to perform initial intake and exercise walk  test. Patient introduced to the [facility] education and orientation packet was reviewed. Completed 6-minute walk test, measurements, initial ITP, and exercise prescription. Vital signs stable. Telemetry-normal sinus rhythm, Yemariam did report having bilateral arm soreness from using the rollator and left knee soreness. This resolved with rest.Shirlette Scarber Jomarie Neer RN BSN    Service time was from 1259 to 1528.

## 2023-07-26 NOTE — Progress Notes (Signed)
 Cardiac Rehab Medication Review by a Nurse   Does the patient  feel that his/her medications are working for him/her?  yes  Has the patient been experiencing any side effects to the medications prescribed?  no  Does the patient measure his/her own blood pressure or blood glucose at home?  yes   Does the patient have any problems obtaining medications due to transportation or finances?   Has to pay $40 foEliquis  and Repatha   Understanding of regimen: good Understanding of indications: good Potential of compliance: good    Nurse comments: Schylar is taking her medication as prescribed and has a good understanding of what her medications are for. Will check with [facility] cardiology to see if the patient will qualify for medication assistance. Setsuko takes her blood pressure once a day. Micki checks her CBG's once a day.Monte Antonio RN BSN     Enedelia Harrison Dhaval Woo 07/26/2023 2:35 PM

## 2023-07-26 NOTE — Telephone Encounter (Signed)
 Dawn Wood, CPhT  You; Maccia, Melissa D, RPH-CPP; Cathalene Clipper, [facility]; Alvstad, Kristin L, RPH-CPP; Sherlie Distance, [id number] minutes ago (3:53 PM)    Hi, I called [facility] and verified the grant information. They said she has to ask for it to be ran each time on the insurance and the grant. They said she has paid for it every time since the one fill she got on the healthwell grant in feb. I called the patient and advised her of this. She just got repatha  on 07/11/23. She said she forgot about grant. She asked me to send her a mychart to remind her about the healthwell grant for when she goes again to get it refilled (I did). I spoke to her about the eliquis  and advised her about the medicare payment plan. She said her eliquis  is 40.00. She said if she can get the repatha  for free, she can pay for the eliquis . She said she already called her insurance about the medicare payment plan and they said the cheapest they could get it down to was 40.00. they said she would have to have the receipt to try for a refund of that 07/11/23 claim.    Will forward information above from our pt assistance team, to Dr. Lorie Rook and covering RN.

## 2023-07-26 NOTE — Telephone Encounter (Signed)
 Peggi Bowels, LPN to Rx Med Assistance Team  Valley, Melissa D, RPH-CPP  Patel, Vaishali K, RPH  Alvstad, Kristin L, RPH-CPP  Wolverton, Kayla, [employer]     07/26/23  3:00 PM  Would you guys be able to assist the pt with Repatha /Eliquis  concerns?  Looks like this has been ongoing for quite sometime  Thanks so much, Triage  I called [facility] and verified the grant information. They said she has to ask for it to be ran each time on the insurance and the grant. They said she has paid for it every time since the one fill she got on the healthwell grant in feb. I called the patient and advised her of this. She just got repatha  on 07/11/23. She said she forgot about grant. She asked me to send her a mychart to remind her about the healthwell grant for when she goes again to get it refilled. I spoke to her about the eliquis  and advised her about the medicare payment plan. She said her eliquis  is 40.00. She said if she can get the repatha  for free, she can pay for the eliquis . She said she already called her insurance about the medicare payment plan and they said the cheapest they could get it down to was 40.00.

## 2023-07-26 NOTE — Telephone Encounter (Signed)
 Dawn Wood from cardiac rehab calling on behalf of pt to see if we can reach out to her in regards to pt assistance for Repatha  as well as Eliquis 

## 2023-07-26 NOTE — Telephone Encounter (Signed)
 Will direct this call to our Pharmacist and pt assistance team, for further management of Eliquis /Repatha  assistance concerns.    Looks like the pt was approved for [employer]  to help cover the copay cost of her Repatha  back on 04/22/23 telephone encounter with pt assistance team.

## 2023-07-28 ENCOUNTER — Other Ambulatory Visit: Payer: Self-pay | Admitting: Nurse Practitioner

## 2023-07-28 DIAGNOSIS — R600 Localized edema: Secondary | ICD-10-CM

## 2023-07-28 DIAGNOSIS — Z79899 Other long term (current) drug therapy: Secondary | ICD-10-CM

## 2023-07-28 DIAGNOSIS — I10 Essential (primary) hypertension: Secondary | ICD-10-CM

## 2023-08-03 ENCOUNTER — Encounter (HOSPITAL_COMMUNITY)
Admission: RE | Admit: 2023-08-03 | Discharge: 2023-08-03 | Disposition: A | Discharge status: Home / Self Care | Source: Ambulatory Visit | Attending: Internal Medicine | Admitting: Internal Medicine

## 2023-08-03 DIAGNOSIS — I213 ST elevation (STEMI) myocardial infarction of unspecified site: Secondary | ICD-10-CM

## 2023-08-03 DIAGNOSIS — E162 Hypoglycemia, unspecified: Secondary | ICD-10-CM | POA: Diagnosis not present

## 2023-08-03 DIAGNOSIS — Z955 Presence of coronary angioplasty implant and graft: Secondary | ICD-10-CM

## 2023-08-03 LAB — GLUCOSE, CAPILLARY
Glucose-Capillary: 100 mg/dL — ABNORMAL HIGH (ref 70–99)
Glucose-Capillary: 109 mg/dL — ABNORMAL HIGH (ref 70–99)
Glucose-Capillary: 140 mg/dL — ABNORMAL HIGH (ref 70–99)

## 2023-08-03 NOTE — Progress Notes (Signed)
 Daily Session Note  Patient Details  Name: Dawn Wood MRN: [id number] Date of Birth: Jan 14, 1946 Referring Provider:   Flowsheet Row INTENSIVE CARDIAC REHAB ORIENT from 07/26/2023 in [facility]  Referring Provider Kyra Phy, MD       Encounter Date: 08/03/2023  Check In:  Session Check In - 08/03/23 1320       Check-In   Supervising physician immediately available to respond to emergencies [facility] MD immediately available    Physician(s) Palmer Bobo, NP    Location [facility]    Staff Present Hilbert Loving, MS, ACSM-CEP, Exercise Physiologist;Deshonda Cryderman, RN, Emerick Hanlon, MS, Exercise Physiologist;Johnny Alexia Angelucci, MS, Exercise Physiologist;Jetta Walker BS, ACSM-CEP, Exercise Physiologist    Virtual Visit No    Medication changes reported     No    Fall or balance concerns reported    No    Tobacco Cessation No Change    Warm-up and Cool-down Performed as group-led instruction   Cardiac Rehab Orientation   Resistance Training Performed No    VAD Patient? No    PAD/SET Patient? No      Pain Assessment   Currently in Pain? No/denies    Pain Score 0-No pain    Multiple Pain Sites No             Capillary Blood Glucose: Results for orders placed or performed during the hospital encounter of 07/26/23 (from the past 24 hours)  Glucose, capillary     Status: Abnormal   Collection Time: 08/03/23 12:48 PM  Result Value Ref Range   Glucose-Capillary 100 (H) 70 - 99 mg/dL  Glucose, capillary     Status: Abnormal   Collection Time: 08/03/23  1:06 PM  Result Value Ref Range   Glucose-Capillary 109 (H) 70 - 99 mg/dL  Glucose, capillary     Status: Abnormal   Collection Time: 08/03/23  1:43 PM  Result Value Ref Range   Glucose-Capillary 140 (H) 70 - 99 mg/dL     Exercise Prescription Changes - 08/03/23 1400       Response to Exercise   Blood Pressure (Admit) 154/70     Blood Pressure (Exercise) 148/78    Blood Pressure (Exit) 122/74    Heart Rate (Admit) 57 bpm    Heart Rate (Exercise) 83 bpm    Heart Rate (Exit) 60 bpm    Rating of Perceived Exertion (Exercise) 12    Comments Pt first day,    Duration Progress to 30 minutes of  aerobic without signs/symptoms of physical distress    Intensity THRR unchanged      Progression   Progression Continue to progress workloads to maintain intensity without signs/symptoms of physical distress.    Average METs 1.5      Resistance Training   Training Prescription No    Weight No weights on Wed      NuStep   Level 1    SPM 58    Minutes 15    METs 1.5             Social History   Tobacco Use  Smoking Status Former   Current packs/day: 0.00   Average packs/day: 0.3 packs/day for 45.0 years (11.3 ttl pk-yrs)   Types: Cigarettes   Start date: 01/11/1968   Quit date: 01/10/2013   Years since quitting: 10.5   Passive exposure: Never  Smokeless Tobacco Never  Tobacco Comments   Former smoker 06/24/23  Goals Met:  Exercise tolerated well No report of concerns or symptoms today  Goals Unmet:  Not Applicable  Comments: Pt started cardiac rehab today.  Pt tolerated light exercise without difficulty. VSS, telemetry-Sinus Brady, Sinus Rhythm, asymptomatic.  Medication list reconciled. Pt denies barriers to medicaiton compliance.  PSYCHOSOCIAL ASSESSMENT:  PHQ-11. Dawn Wood says that she feels like her health has decline ever since she retired. Dawn Wood says she is depressed. Dawn Wood says she has her son in law and daughter are there if she needs to talk with someone. Dawn Wood is not taking an antidepressant at this time. Dawn Wood will let us  know if she needs changes her mind and needs assistance with obtaining a counseling referral.    Pt enjoys playing cards.   Pt oriented to exercise equipment and routine.    Understanding verbalized. Monte Antonio RN BSN    Dr. Gaylyn Keas is Medical Director  for Cardiac Rehab at [facility].

## 2023-08-05 ENCOUNTER — Encounter (HOSPITAL_COMMUNITY)
Admission: RE | Admit: 2023-08-05 | Discharge: 2023-08-05 | Disposition: A | Discharge status: Home / Self Care | Source: Ambulatory Visit | Attending: Internal Medicine | Admitting: Internal Medicine

## 2023-08-05 DIAGNOSIS — Z955 Presence of coronary angioplasty implant and graft: Secondary | ICD-10-CM | POA: Diagnosis not present

## 2023-08-05 DIAGNOSIS — I213 ST elevation (STEMI) myocardial infarction of unspecified site: Secondary | ICD-10-CM | POA: Diagnosis not present

## 2023-08-05 DIAGNOSIS — E162 Hypoglycemia, unspecified: Secondary | ICD-10-CM

## 2023-08-05 LAB — GLUCOSE, CAPILLARY: Glucose-Capillary: 85 mg/dL (ref 70–99)

## 2023-08-05 NOTE — Progress Notes (Signed)
 Pre exercise CBG 124. Post exercise CBG 62. Patient reported felling a little lightheaded. Patient reported eating at 1000.  Skin warm and dry. Patient was given glucose gel and ginger ale. Dawn Wood says that she took 40 units of insulin  this morning and took 45 units yesterday evening. Repeat CBG 76. Patient was given another ginger ale and peanut butter and graham crackers.Repeat CBG 85. Patient's primary care provider's office Dr Sonja Hosmer office was called and notified.  I asked Dr Librada Reedy office if she would consider cutting back on Dawn Wood's insulin  on exercise days and if the patient would benefit from having a CGM. Dawn Wood left cardiac rehab without further complaints or symptoms and plans to eat dinner when she gets home. Will notify onsite provider Marlana Silvan NP about today's events.Monte Antonio RN BSN

## 2023-08-08 ENCOUNTER — Encounter (HOSPITAL_COMMUNITY): Admission: RE | Admit: 2023-08-08 | Source: Ambulatory Visit

## 2023-08-08 ENCOUNTER — Other Ambulatory Visit (HOSPITAL_COMMUNITY): Payer: Self-pay

## 2023-08-08 ENCOUNTER — Telehealth: Payer: Self-pay

## 2023-08-08 ENCOUNTER — Telehealth (HOSPITAL_COMMUNITY): Payer: Self-pay

## 2023-08-08 LAB — GLUCOSE, CAPILLARY
Glucose-Capillary: 124 mg/dL — ABNORMAL HIGH (ref 70–99)
Glucose-Capillary: 62 mg/dL — ABNORMAL LOW (ref 70–99)
Glucose-Capillary: 76 mg/dL (ref 70–99)

## 2023-08-08 NOTE — Telephone Encounter (Signed)
 Reason for CRM: Shelagh Derrick from Cardiac Rehab called stated she checked patients sugar and it was 124 before PT but she had to give patient glucose as she started to feel lightheaded. Shelagh Derrick checked her sugar and it was 63 so she gave her some glucose and it brought it back up to 76 but she has to be over 80 before she will allow her to drive home. Shelagh Derrick is requesting patient cut back on her insulin  either on Sun, Tues and Thurs night, Mon, Wed or Fri morning or both so her sugar is elevated enough for her to do PT without feeling lightheaded. Patient goes to therapy on Mon, Wed and Fri. Lastly Shelagh Derrick is asking since patient lives alone that she has a CGM so she is able to check her sugar.   I called patient and notified her that we are ordering a Dexcom sensor for her to start using and to also remind her that she is to eat a full meal after taking her insulin . Patient stated she wasn't eating meals after taking her insulin  she was just eating a slight snack. Patient was advised to make sure she is eating a full meal for breakfast and dinner right after taking her insulin . She is to do 35units in the morning before breakfast on the days she has cardiac rehab. Patient verbalized understanding and she is scheduled to come in for a [place] dexcom teaching.YL,RMA

## 2023-08-08 NOTE — Telephone Encounter (Signed)
 Patient left message calling out for 12:30pm class, states she has bad diarrhea.

## 2023-08-09 ENCOUNTER — Other Ambulatory Visit: Payer: Self-pay

## 2023-08-09 ENCOUNTER — Ambulatory Visit

## 2023-08-09 VITALS — BP 120/70 | HR 98 | Temp 98.5°F | Ht 64.0 in | Wt 222.0 lb

## 2023-08-09 DIAGNOSIS — Z794 Long term (current) use of insulin: Secondary | ICD-10-CM

## 2023-08-09 MED ORDER — DEXCOM G7 SENSOR MISC: 2 refills | Status: AC

## 2023-08-09 NOTE — Progress Notes (Signed)
 Patient is in office today for a nurse visit for dexcom teaching. Patient verbalized understanding. She reports it may be a little confusing but she will call if she needs assistance.

## 2023-08-09 NOTE — Patient Instructions (Signed)
 Dexcom-  User [id number] Password- [phone]

## 2023-08-10 ENCOUNTER — Encounter (HOSPITAL_COMMUNITY)
Admission: RE | Admit: 2023-08-10 | Discharge: 2023-08-10 | Disposition: A | Discharge status: Home / Self Care | Source: Ambulatory Visit | Attending: Internal Medicine | Admitting: Internal Medicine

## 2023-08-10 DIAGNOSIS — Z48812 Encounter for surgical aftercare following surgery on the circulatory system: Secondary | ICD-10-CM | POA: Diagnosis not present

## 2023-08-10 DIAGNOSIS — E1122 Type 2 diabetes mellitus with diabetic chronic kidney disease: Secondary | ICD-10-CM | POA: Diagnosis not present

## 2023-08-10 DIAGNOSIS — Z955 Presence of coronary angioplasty implant and graft: Secondary | ICD-10-CM | POA: Insufficient documentation

## 2023-08-10 DIAGNOSIS — N1831 Chronic kidney disease, stage 3a: Secondary | ICD-10-CM | POA: Diagnosis not present

## 2023-08-10 DIAGNOSIS — I213 ST elevation (STEMI) myocardial infarction of unspecified site: Secondary | ICD-10-CM | POA: Diagnosis present

## 2023-08-10 DIAGNOSIS — I252 Old myocardial infarction: Secondary | ICD-10-CM | POA: Insufficient documentation

## 2023-08-10 DIAGNOSIS — E162 Hypoglycemia, unspecified: Secondary | ICD-10-CM | POA: Diagnosis present

## 2023-08-12 ENCOUNTER — Encounter (HOSPITAL_COMMUNITY)
Admission: RE | Admit: 2023-08-12 | Discharge: 2023-08-12 | Disposition: A | Discharge status: Home / Self Care | Source: Ambulatory Visit | Attending: Internal Medicine | Admitting: Internal Medicine

## 2023-08-12 DIAGNOSIS — E162 Hypoglycemia, unspecified: Secondary | ICD-10-CM

## 2023-08-12 DIAGNOSIS — Z955 Presence of coronary angioplasty implant and graft: Secondary | ICD-10-CM | POA: Diagnosis not present

## 2023-08-12 DIAGNOSIS — Z48812 Encounter for surgical aftercare following surgery on the circulatory system: Secondary | ICD-10-CM | POA: Diagnosis not present

## 2023-08-12 DIAGNOSIS — I252 Old myocardial infarction: Secondary | ICD-10-CM | POA: Diagnosis not present

## 2023-08-12 DIAGNOSIS — I213 ST elevation (STEMI) myocardial infarction of unspecified site: Secondary | ICD-10-CM

## 2023-08-12 LAB — GLUCOSE, CAPILLARY
Glucose-Capillary: 137 mg/dL — ABNORMAL HIGH (ref 70–99)
Glucose-Capillary: 99 mg/dL (ref 70–99)

## 2023-08-15 ENCOUNTER — Encounter (HOSPITAL_COMMUNITY)
Admission: RE | Admit: 2023-08-15 | Discharge: 2023-08-15 | Disposition: A | Discharge status: Home / Self Care | Source: Ambulatory Visit | Attending: Internal Medicine | Admitting: Internal Medicine

## 2023-08-15 DIAGNOSIS — Z955 Presence of coronary angioplasty implant and graft: Secondary | ICD-10-CM | POA: Diagnosis not present

## 2023-08-15 DIAGNOSIS — Z48812 Encounter for surgical aftercare following surgery on the circulatory system: Secondary | ICD-10-CM | POA: Diagnosis not present

## 2023-08-15 DIAGNOSIS — I252 Old myocardial infarction: Secondary | ICD-10-CM | POA: Diagnosis not present

## 2023-08-15 DIAGNOSIS — I213 ST elevation (STEMI) myocardial infarction of unspecified site: Secondary | ICD-10-CM

## 2023-08-15 NOTE — Progress Notes (Signed)
 CBG 102 by Aliciana's CGM post exercise. CBG 80 hospital meter. Patient had already eaten peanut butter crackers and apple juice. Patient was given ginger ale and was given a 1/2 banana. Recheck CBG 120. Will notify Dr Librada Reedy office. Patient's insulin  dose may need to be further adjusted.Monte Antonio RN BSN

## 2023-08-16 LAB — GLUCOSE, CAPILLARY
Glucose-Capillary: 80 mg/dL (ref 70–99)
Glucose-Capillary: 120 mg/dL — ABNORMAL HIGH (ref 70–99)

## 2023-08-17 ENCOUNTER — Encounter (HOSPITAL_COMMUNITY): Admission: RE | Admit: 2023-08-17 | Source: Ambulatory Visit

## 2023-08-17 ENCOUNTER — Telehealth (HOSPITAL_COMMUNITY): Payer: Self-pay

## 2023-08-17 NOTE — Telephone Encounter (Signed)
 Patient c/o for 12:30pm CR class due to back pain, states she is contacting PCP to be seen for it.

## 2023-08-18 ENCOUNTER — Ambulatory Visit: Payer: Self-pay | Admitting: *Deleted

## 2023-08-18 NOTE — Telephone Encounter (Signed)
 Copied from CRM [id number]. Topic: Clinical - Red Word Triage >> Aug 18, 2023  8:09 AM Dawn Wood wrote: Red Word that prompted transfer to Nurse Triage: Pain in her back, Tuesday onset. Reason for Disposition  [1] MODERATE pain (e.g., interferes with normal activities) AND [2] high-risk adult (e.g., age > 60 years, osteoporosis, chronic steroid use)  Answer Assessment - Initial Assessment Questions 1. MECHANISM: How did the injury happen? (Consider the possibility of domestic violence or elder abuse)     I've been having this back pain since Tuesday.   It just started hurting. 2. ONSET: When did the injury happen? (Minutes or hours ago)     Tuesday 3. LOCATION: What part of the back is injured?     My lower back 4. SEVERITY: Can you move the back normally?     It really hurts on the right side 5. PAIN: Is there any pain? If Yes, ask: How bad is the pain?   (Scale 1-10; or mild, moderate, severe)     Yes on right lower back.    6. CORD SYMPTOMS: Any weakness or numbness of the arms or legs?     Refused triage 7. SIZE: For cuts, bruises, or swelling, ask: How large is it? (e.g., inches or centimeters)     Refused to answer 8. TETANUS: For any breaks in the skin, ask: When was the last tetanus booster?     Refused to answer 9. OTHER SYMPTOMS: Do you have any other symptoms? (e.g., abdomen pain, blood in urine)     Refused to answer 10. PREGNANCY: Is there any chance you are pregnant? When was your last menstrual period?       N/A due to age  Protocols used: Back Injury-A-AH  Pt refused to answer triage questions.  Demanded an appt with Dr. Elnita Wood.  No appts available until June 19.   Pt already has an appt for 08/24/2023 with Dr. Elnita Wood.  She demanded to speak with Dr. Elnita Wood' direct nurse.   I called into the practice and they transferred me to nurse line.   Got the voicemail.   No message left.    I let pt know I would send a message  over and the nurse would call  pt when she had a chance.   Pt. Was agreeable to this plan.     FYI Only or Action Required?: Action required by provider  Patient was last seen in primary care on 06/23/2023 by Dawn Curling, MD. Called Nurse Triage reporting Back Pain. Symptoms began several days ago. Interventions attempted: Nothing. Symptoms are: gradually worsening.  Triage Disposition: See Physician Within 24 Hours  Patient/caregiver understands and will follow disposition?:   Pt demanding to speak with Dr. Elnita Wood' nurse in the office.   Message sent.

## 2023-08-19 ENCOUNTER — Encounter (HOSPITAL_COMMUNITY): Admission: RE | Admit: 2023-08-19 | Source: Ambulatory Visit

## 2023-08-19 ENCOUNTER — Telehealth (HOSPITAL_COMMUNITY): Payer: Self-pay

## 2023-08-19 NOTE — Telephone Encounter (Signed)
 Patient c/o for 12:30pm CR class, left message stating she is still having severe back pain and could not get in to see her doctor for it until next Wednesday at 11am. She is unsure she will make her class next Monday.

## 2023-08-22 ENCOUNTER — Other Ambulatory Visit: Payer: Self-pay | Admitting: Nurse Practitioner

## 2023-08-22 ENCOUNTER — Encounter (HOSPITAL_COMMUNITY): Admission: RE | Admit: 2023-08-22 | Source: Ambulatory Visit

## 2023-08-22 ENCOUNTER — Telehealth (HOSPITAL_COMMUNITY): Payer: Self-pay

## 2023-08-22 NOTE — Telephone Encounter (Signed)
 Patient c/o for 12:30pm cardiac rehab class due to back pain, has appt on 6/18 at 11am to see PCP for issue. Cancelling appt for today 6/16 and 6/18.

## 2023-08-23 ENCOUNTER — Telehealth: Payer: Self-pay | Admitting: Internal Medicine

## 2023-08-23 ENCOUNTER — Telehealth: Payer: Self-pay | Admitting: Nurse Practitioner

## 2023-08-23 MED ORDER — ISOSORBIDE MONONITRATE ER 60 MG PO TB24: 60.0000 mg | ORAL_TABLET | Freq: Every day | ORAL | 2 refills | Status: AC

## 2023-08-23 NOTE — Telephone Encounter (Signed)
 Spoke to patient she stated she has been having pain left side of chest.She notices more when she turns.She is always sob,no worse.She would like appointment.Appointment scheduled with Charles Connor NP 6/24 at 8:50 am.

## 2023-08-23 NOTE — Telephone Encounter (Signed)
   Pt c/o of Chest Pain: STAT if active CP, including tightness, pressure, jaw pain, radiating pain to shoulder/upper arm/back, CP unrelieved by Nitro. Symptoms reported of SOB, nausea, vomiting, sweating.  1. Are you having CP right now?   Not stated  2. Are you experiencing any other symptoms (ex. SOB, nausea, vomiting, sweating)?   Not stated  3. Is your CP continuous or coming and going?   Not stated  4. Have you taken Nitroglycerin ?   Not stated  5. How long have you been experiencing CP?   Not stated  6. If NO CP at time of call then end call with telling Pt to call back or call 911 if Chest pain returns prior to return call from triage team.   Patient stated she was having chest pains but did not want to give any further details.

## 2023-08-23 NOTE — Telephone Encounter (Signed)
 Pt's medication was sent to pt's pharmacy as requested. Confirmation received.

## 2023-08-23 NOTE — Progress Notes (Signed)
 Cardiac Individual Treatment Plan  Patient Details  Name: Dawn Wood MRN: [id number] Date of Birth: 1945-12-03 Referring Provider:   Flowsheet Row INTENSIVE CARDIAC REHAB ORIENT from 07/26/2023 in [facility]  Referring Provider Kyra Phy, MD    Initial Encounter Date:  Flowsheet Row INTENSIVE CARDIAC REHAB ORIENT from 07/26/2023 in [facility], [facility], [facility]  Date 07/26/23    Visit Diagnosis: 11/30/23 STEMI  11/30/23 DES LAD  Patient's Home Medications on Admission:  Current Outpatient Medications:    Accu-Chek Softclix Lancets lancets, USE TO CHECK BLOOD SUGARS ONCE DAILY., Disp: 100 each, Rfl: 12   albuterol  (VENTOLIN  HFA) 108 (90 Base) MCG/ACT inhaler, Inhale 2 puffs into the lungs every 6 (six) hours as needed for wheezing or shortness of breath., Disp: 8 g, Rfl: 2   BD PEN NEEDLE NANO 2ND GEN 32G X 4 MM MISC, USE AS DIRECTED TWICE DAILY, Disp: 100 each, Rfl: 2   Blood Glucose Monitoring Suppl (ACCU-CHEK GUIDE) w/Device KIT, USE TO CHECK BLOOD SUGARS ONCE DAILY., Disp: 1 kit, Rfl: 0   budesonide -formoterol  (SYMBICORT ) 160-4.5 MCG/ACT inhaler, Inhale 2 puffs into the lungs 2 (two) times daily., Disp: 10.2 g, Rfl: 11   Cholecalciferol (VITAMIN D3) 2000 units TABS, Take 1 tablet by mouth daily., Disp: , Rfl:    colchicine  0.6 MG tablet, Take 1 tablet (0.6 mg total) by mouth as needed (for gout)., Disp: 30 tablet, Rfl: 1   Continuous Glucose Sensor (DEXCOM G7 SENSOR) MISC, Use daily to check blood sugars. Change every 10 days., Disp: 4 each, Rfl: 2   diltiazem  (CARDIZEM  CD) 180 MG 24 hr capsule, Take 1 capsule (180 mg total) by mouth daily., Disp: 90 capsule, Rfl: 1   dorzolamide -timolol  (COSOPT ) 2-0.5 % ophthalmic solution, Place 1 drop into the right eye 2 (two) times daily., Disp: , Rfl:    ELIQUIS  5 MG TABS tablet, TAKE 1 TABLET(5 MG) BY MOUTH TWICE DAILY, Disp:  180 tablet, Rfl: 1   Evolocumab  (REPATHA  SURECLICK) 140 MG/ML SOAJ, Inject 140 mg into the skin every 14 (fourteen) days., Disp: 6 mL, Rfl: 3   fluticasone  (FLONASE ) 50 MCG/ACT nasal spray, Place 2 sprays into both nostrils daily., Disp: 16 g, Rfl: 2   furosemide  (LASIX ) 20 MG tablet, TAKE 1 TABLET(20 MG) BY MOUTH DAILY, Disp: 30 tablet, Rfl: 10   glucose blood (ACCU-CHEK GUIDE) test strip, USE TO CHECK BLOOD SUGARS ONCE DAILY., Disp: 100 each, Rfl: 12   insulin  aspart protamine - aspart (NOVOLOG  MIX 70/30 FLEXPEN) (70-30) 100 UNIT/ML FlexPen, INJECT 40 UNITS SUBCUTANEOUS EVERY MORNING AND 50 UNITS SUBCUTANEOUS AT DINNER (Patient taking differently: INJECT 45 UNITS SUBCUTANEOUS EVERY MORNING AND 50 UNITS SUBCUTANEOUS AT DINNER), Disp: 30 mL, Rfl: 2   isosorbide  mononitrate (IMDUR ) 60 MG 24 hr tablet, Take 1 tablet (60 mg total) by mouth daily., Disp: 90 tablet, Rfl: 2   Latanoprostene Bunod  (VYZULTA ) 0.024 % SOLN, Place 1 drop into the right eye at bedtime., Disp: , Rfl:    metoprolol  succinate (TOPROL -XL) 100 MG 24 hr tablet, Take 1.5 tablets (150 mg total) by mouth daily. Take with or immediately following a meal., Disp: 135 tablet, Rfl: 3   nitroGLYCERIN  (NITROSTAT ) 0.4 MG SL tablet, Place 1 tablet (0.4 mg total) under the tongue every 5 (five) minutes as needed for chest pain., Disp: 25 tablet, Rfl: 11   olmesartan  (BENICAR ) 40 MG tablet, TAKE 1 TABLET(40 MG) BY  MOUTH DAILY, Disp: 90 tablet, Rfl: 2   ONETOUCH VERIO test strip, USE TO CHECK BLOOD SUGARS TWICE DAILY AS DIRECTED, Disp: 100 strip, Rfl: 3   pantoprazole  (PROTONIX ) 40 MG tablet, TAKE 1 TABLET(40 MG) BY MOUTH DAILY, Disp: 90 tablet, Rfl: 1   potassium chloride  (KLOR-CON ) 10 MEQ tablet, Take 1 tablet (10 mEq total) by mouth at bedtime., Disp: , Rfl:    Semaglutide ,0.25 or 0.5MG /DOS, (OZEMPIC , 0.25 OR 0.5 MG/DOSE,) 2 MG/1.5ML SOPN, INJECT 0.5MG  INTO SKIN ONCE A WEEK, Disp: 4.5 mL, Rfl: 3   spironolactone  (ALDACTONE ) 25 MG tablet, TAKE 1  TABLET(25 MG) BY MOUTH DAILY, Disp: 90 tablet, Rfl: 1  Past Medical History: Past Medical History:  Diagnosis Date   Asthma    Atrial fibrillation (HCC)    CKD (chronic kidney disease)    Coronary artery disease    Diabetes mellitus without complication (HCC)    GERD (gastroesophageal reflux disease)    Heart attack (HCC) 12/2022   Hypertension    MI (myocardial infarction) (HCC)    Peripheral vascular disease (HCC)    Sleep apnea     Tobacco Use: Social History   Tobacco Use  Smoking Status Former   Current packs/day: 0.00   Average packs/day: 0.3 packs/day for 45.0 years (11.3 ttl pk-yrs)   Types: Cigarettes   Start date: 01/11/1968   Quit date: 01/10/2013   Years since quitting: 10.6   Passive exposure: Never  Smokeless Tobacco Never  Tobacco Comments   Former smoker 06/24/23    Labs: Review Flowsheet  More data exists      Latest Ref Rng & Units 08/05/2022 10/21/2022 11/30/2022 12/01/2022 04/21/2023  Labs for ITP Cardiac and Pulmonary Rehab  Cholestrol 100 - 199 mg/dL - - 098  119  147   LDL (calc) 0 - 99 mg/dL - - 829  562  57   HDL-C >39 mg/dL - - 40  36  53   Trlycerides 0 - 149 mg/dL - - 130  865  92   Hemoglobin A1c 4.8 - 5.6 % 6.8  6.9  7.1  - 6.1     Capillary Blood Glucose: Lab Results  Component Value Date   GLUCAP 120 (H) 08/15/2023   GLUCAP 80 08/15/2023   GLUCAP 99 08/12/2023   GLUCAP 137 (H) 08/12/2023   GLUCAP 85 08/05/2023     Exercise Target Goals: Exercise Program Goal: Individual exercise prescription set using results from initial 6 min walk test and THRR while considering  patient's activity barriers and safety.   Exercise Prescription Goal: Initial exercise prescription builds to 30-45 minutes a day of aerobic activity, 2-3 days per week.  Home exercise guidelines will be given to patient during program as part of exercise prescription that the participant will acknowledge.  Activity Barriers & Risk Stratification:  Activity  Barriers & Cardiac Risk Stratification - 07/26/23 1306       Activity Barriers & Cardiac Risk Stratification   Activity Barriers History of Falls;Balance Concerns;Shortness of Breath;Other (comment)    Comments PVD, poor circulation in legs. Some back pain with walking.    Cardiac Risk Stratification High          6 Minute Walk:  6 Minute Walk     Row Name 07/26/23 1404         6 Minute Walk   Phase Initial     Distance 960 feet     Walk Time 6 minutes     # of Rest Breaks 0  MPH 1.82     METS 1.45     RPE 11     Perceived Dyspnea  2     VO2 Peak 5.09     Symptoms Yes (comment)     Comments Mild shortness of breath. Soreness in both arms from pushing rollator, soreness in left knee, hx of knee surgery.     Resting HR 55 bpm     Resting BP 140/64     Resting Oxygen Saturation  95 %     Exercise Oxygen Saturation  during 6 min walk 96 %     Max Ex. HR 85 bpm     Max Ex. BP 178/72     2 Minute Post BP 138/70        Oxygen Initial Assessment:   Oxygen Re-Evaluation:   Oxygen Discharge (Final Oxygen Re-Evaluation):   Initial Exercise Prescription:  Initial Exercise Prescription - 07/26/23 1400       Date of Initial Exercise RX and Referring Provider   Date 07/26/23    Referring Provider Thukkani, Arun K, MD    Expected Discharge Date 10/19/23      NuStep   Level 1    SPM 85    Minutes 15    METs 1.5      Track   Laps 11    Minutes 15    METs 2.4      Prescription Details   Frequency (times per week) 3    Duration Progress to 30 minutes of continuous aerobic without signs/symptoms of physical distress      Intensity   THRR 40-80% of Max Heartrate 57-114    Ratings of Perceived Exertion 11-13    Perceived Dyspnea 0-4      Progression   Progression Continue to progress workloads to maintain intensity without signs/symptoms of physical distress.      Resistance Training   Training Prescription Yes    Weight 2 lbs    Reps 10-15           Perform Capillary Blood Glucose checks as needed.  Exercise Prescription Changes:   Exercise Prescription Changes     Row Name 08/03/23 1400             Response to Exercise   Blood Pressure (Admit) 154/70       Blood Pressure (Exercise) 148/78       Blood Pressure (Exit) 122/74       Heart Rate (Admit) 57 bpm       Heart Rate (Exercise) 83 bpm       Heart Rate (Exit) 60 bpm       Rating of Perceived Exertion (Exercise) 12       Comments Pt first day,       Duration Progress to 30 minutes of  aerobic without signs/symptoms of physical distress       Intensity THRR unchanged         Progression   Progression Continue to progress workloads to maintain intensity without signs/symptoms of physical distress.       Average METs 1.5         Resistance Training   Training Prescription No       Weight No weights on Wed         NuStep   Level 1       SPM 58       Minutes 15       METs 1.5  Exercise Comments:   Exercise Comments     Row Name 08/03/23 1422           Exercise Comments Pt first day in program. CBG check prior to exercise was 100, pt was given snacks and rechecked prior to exercise. Recheck after 15 minutes was 109, pt exercised on the NuStep only today. Pt tolerated her ExRX well.          Exercise Goals and Review:   Exercise Goals     Row Name 07/26/23 1306             Exercise Goals   Increase Physical Activity Yes       Intervention Provide advice, education, support and counseling about physical activity/exercise needs.;Develop an individualized exercise prescription for aerobic and resistive training based on initial evaluation findings, risk stratification, comorbidities and participant's personal goals.       Expected Outcomes Short Term: Attend rehab on a regular basis to increase amount of physical activity.;Long Term: Exercising regularly at least 3-5 days a week.;Long Term: Add in home exercise to make exercise part of  routine and to increase amount of physical activity.       Increase Strength and Stamina Yes       Intervention Provide advice, education, support and counseling about physical activity/exercise needs.;Develop an individualized exercise prescription for aerobic and resistive training based on initial evaluation findings, risk stratification, comorbidities and participant's personal goals.       Expected Outcomes Short Term: Increase workloads from initial exercise prescription for resistance, speed, and METs.;Short Term: Perform resistance training exercises routinely during rehab and add in resistance training at home;Long Term: Improve cardiorespiratory fitness, muscular endurance and strength as measured by increased METs and functional capacity ( )       Able to understand and use rate of perceived exertion (RPE) scale Yes       Intervention Provide education and explanation on how to use RPE scale       Expected Outcomes Short Term: Able to use RPE daily in rehab to express subjective intensity level;Long Term:  Able to use RPE to guide intensity level when exercising independently       Knowledge and understanding of Target Heart Rate Range (THRR) Yes       Intervention Provide education and explanation of THRR including how the numbers were predicted and where they are located for reference       Expected Outcomes Short Term: Able to state/look up THRR;Long Term: Able to use THRR to govern intensity when exercising independently;Short Term: Able to use daily as guideline for intensity in rehab       Able to check pulse independently Yes       Intervention Provide education and demonstration on how to check pulse in carotid and radial arteries.;Review the importance of being able to check your own pulse for safety during independent exercise       Expected Outcomes Short Term: Able to explain why pulse checking is important during independent exercise;Long Term: Able to check pulse independently  and accurately       Understanding of Exercise Prescription Yes       Intervention Provide education, explanation, and written materials on patient's individual exercise prescription       Expected Outcomes Short Term: Able to explain program exercise prescription;Long Term: Able to explain home exercise prescription to exercise independently          Exercise Goals Re-Evaluation :   Discharge Exercise Prescription (  Final Exercise Prescription Changes):  Exercise Prescription Changes - 08/03/23 1400       Response to Exercise   Blood Pressure (Admit) 154/70    Blood Pressure (Exercise) 148/78    Blood Pressure (Exit) 122/74    Heart Rate (Admit) 57 bpm    Heart Rate (Exercise) 83 bpm    Heart Rate (Exit) 60 bpm    Rating of Perceived Exertion (Exercise) 12    Comments Pt first day,    Duration Progress to 30 minutes of  aerobic without signs/symptoms of physical distress    Intensity THRR unchanged      Progression   Progression Continue to progress workloads to maintain intensity without signs/symptoms of physical distress.    Average METs 1.5      Resistance Training   Training Prescription No    Weight No weights on Wed      NuStep   Level 1    SPM 58    Minutes 15    METs 1.5          Nutrition:  Target Goals: Understanding of nutrition guidelines, daily intake of sodium 1500mg , cholesterol 200mg , calories 30% from fat and 7% or less from saturated fats, daily to have 5 or more servings of fruits and vegetables.  Biometrics:  Pre Biometrics - 07/26/23 1259       Pre Biometrics   Waist Circumference 47 inches    Hip Circumference 51.5 inches    Waist to Hip Ratio 0.91 %    Triceps Skinfold 55 mm    % Body Fat 52.7 %    Grip Strength 20 kg    Flexibility 12.5 in    Single Leg Stand 2.93 seconds           Nutrition Therapy Plan and Nutrition Goals:  Nutrition Therapy & Goals - 08/04/23 1118       Nutrition Therapy   Diet Heart Healthy Diet       Personal Nutrition Goals   Nutrition Goal Patient to identify strategies for reducing cardiovascular risk by attending the [employer] education and nutrition series weekly.    Personal Goal #2 Patient to improve diet quality by using the plate method as a guide for meal planning to include lean protein/plant protein, fruits, vegetables, whole grains, nonfat dairy as part of a well-balanced diet.    Comments Patient has medical history of history of CAD s/p NSTEMI treated with PCI/DES to LAD with 60% RCA and 40% LCx, HTN, HLD, PVD, DM type II, PAF (on Eliquis ), arthritis. A1c is well controlled in a pre-diabetic range. LDL is at goal. Patient will benefit from participation in intensive cardiac rehab for nutrition, exercise, and lifestyle modification.      Intervention Plan   Intervention Prescribe, educate and counsel regarding individualized specific dietary modifications aiming towards targeted core components such as weight, hypertension, lipid management, diabetes, heart failure and other comorbidities.;Nutrition handout(s) given to patient.    Expected Outcomes Short Term Goal: Understand basic principles of dietary content, such as calories, fat, sodium, cholesterol and nutrients.;Long Term Goal: Adherence to prescribed nutrition plan.          Nutrition Assessments:  Nutrition Assessments - 08/04/23 1511       Rate Your Plate Scores   Pre Score 54         MEDIFICTS Score Key: >=70 Need to make dietary changes  40-70 Heart Healthy Diet <= 40 Therapeutic Level Cholesterol Diet   Flowsheet Row INTENSIVE CARDIAC REHAB from  08/03/2023 in [facility]  Picture Your Plate Total Score on Admission 54   Picture Your Plate Scores: <16 Unhealthy dietary pattern with much room for improvement. 41-50 Dietary pattern unlikely to meet recommendations for good health and room for improvement. 51-60 More healthful dietary pattern,  with some room for improvement.  >60 Healthy dietary pattern, although there may be some specific behaviors that could be improved.    Nutrition Goals Re-Evaluation:  Nutrition Goals Re-Evaluation     Row Name 08/04/23 1118             Goals   Current Weight 220 lb 14.4 oz (100.2 kg)       Comment A1c 6.1 (insulin , ozempic ), LDL 57 (repatha ) , HDL 53       Expected Outcome Patient has medical history of history of CAD s/p NSTEMI treated with PCI/DES to LAD with 60% RCA and 40% LCx, HTN, HLD, PVD, DM type II, PAF (on Eliquis ), arthritis. A1c is well controlled in a pre-diabetic range. LDL is at goal. Patient will benefit from participation in intensive cardiac rehab for nutrition, exercise, and lifestyle modification.          Nutrition Goals Re-Evaluation:  Nutrition Goals Re-Evaluation     Row Name 08/04/23 1118             Goals   Current Weight 220 lb 14.4 oz (100.2 kg)       Comment A1c 6.1 (insulin , ozempic ), LDL 57 (repatha ) , HDL 53       Expected Outcome Patient has medical history of history of CAD s/p NSTEMI treated with PCI/DES to LAD with 60% RCA and 40% LCx, HTN, HLD, PVD, DM type II, PAF (on Eliquis ), arthritis. A1c is well controlled in a pre-diabetic range. LDL is at goal. Patient will benefit from participation in intensive cardiac rehab for nutrition, exercise, and lifestyle modification.          Nutrition Goals Discharge (Final Nutrition Goals Re-Evaluation):  Nutrition Goals Re-Evaluation - 08/04/23 1118       Goals   Current Weight 220 lb 14.4 oz (100.2 kg)    Comment A1c 6.1 (insulin , ozempic ), LDL 57 (repatha ) , HDL 53    Expected Outcome Patient has medical history of history of CAD s/p NSTEMI treated with PCI/DES to LAD with 60% RCA and 40% LCx, HTN, HLD, PVD, DM type II, PAF (on Eliquis ), arthritis. A1c is well controlled in a pre-diabetic range. LDL is at goal. Patient will benefit from participation in intensive cardiac rehab for nutrition,  exercise, and lifestyle modification.          Psychosocial: Target Goals: Acknowledge presence or absence of significant depression and/or stress, maximize coping skills, provide positive support system. Participant is able to verbalize types and ability to use techniques and skills needed for reducing stress and depression.  Initial Review & Psychosocial Screening:  Initial Psych Review & Screening - 07/26/23 1336       Initial Review   Current issues with Current Depression;Current Stress Concerns    Source of Stress Concerns Unable to participate in former interests or hobbies;Retirement/disability;Chronic Illness    Comments Kerie has stress/ depression because of health issues since retirement. Not able to do everything she would like to do.      Family Dynamics   Good Support System? Yes    Comments Support from her 2 daughters. Talks to son-law who's a Education officer, environmental. Talks to PCP.  Barriers   Psychosocial barriers to participate in program The patient should benefit from training in stress management and relaxation.      Screening Interventions   Interventions Encouraged to exercise;Provide feedback about the scores to participant    Expected Outcomes Short Term goal: Utilizing psychosocial counselor, staff and physician to assist with identification of specific Stressors or current issues interfering with healing process. Setting desired goal for each stressor or current issue identified.;Long Term Goal: Stressors or current issues are controlled or eliminated.;Short Term goal: Identification and review with participant of any Quality of Life or Depression concerns found by scoring the questionnaire.;Long Term goal: The participant improves quality of Life and PHQ9 Scores as seen by post scores and/or verbalization of changes          Quality of Life Scores:  Quality of Life - 07/27/23 0836       Quality of Life   Select Quality of Life      Quality of Life Scores    Health/Function Pre 19.54 %    Socioeconomic Pre 26 %    Psych/Spiritual Pre 20.57 %    Family Pre 26 %    GLOBAL Pre 21.93 %         Scores of 19 and below usually indicate a poorer quality of life in these areas.  A difference of  2-3 points is a clinically meaningful difference.  A difference of 2-3 points in the total score of the Quality of Life Index has been associated with significant improvement in overall quality of life, self-image, physical symptoms, and general health in studies assessing change in quality of life.  PHQ-9: Review Flowsheet  More data exists      07/26/2023 04/21/2023 10/21/2022 05/04/2022 10/20/2021  Depression screen PHQ 2/9  Decreased Interest 2 0 0 0 2  Down, Depressed, Hopeless 3 0 3 0 0  PHQ - 2 Score 5 0 3 0 2  Altered sleeping 0 0 3 - 3  Tired, decreased energy 3 0 3 - 3  Change in appetite 0 0 0 - 0  Feeling bad or failure about yourself  1 0 0 - 0  Trouble concentrating 0 0 0 - 0  Moving slowly or fidgety/restless 2 0 0 - 0  Suicidal thoughts 0 0 0 - 0  PHQ-9 Score 11 0 9 - 8  Difficult doing work/chores Somewhat difficult Not difficult at all Somewhat difficult - Somewhat difficult   Interpretation of Total Score  Total Score Depression Severity:  1-4 = Minimal depression, 5-9 = Mild depression, 10-14 = Moderate depression, 15-19 = Moderately severe depression, 20-27 = Severe depression   Psychosocial Evaluation and Intervention:   Psychosocial Re-Evaluation:  Psychosocial Re-Evaluation     Row Name 08/11/23 1608 08/23/23 1803           Psychosocial Re-Evaluation   Current issues with Current Stress Concerns;Current Depression Current Stress Concerns;Current Depression      Comments Reviewed PHQ9Laverne says that she feels like her health has decline ever since she retired. Nidya says she is depressed. Salote says she has her son in law and daughter are there if she needs to talk with someone. Langston is not taking an antidepressant  at this time. Latanza will let us  know if she needs changes her mind and needs assistance with obtaining a counseling referral. Anayla has not voiced any increased concerns or stressors during exercise. last day of attendance was on 08/15/23      Expected Outcomes  Shakeeta will have controlled or decreased depression/ stressors upon completion of cardiac rehab Jesilyn will have controlled or decreased depression/ stressors upon completion of cardiac rehab      Interventions Stress management education;Encouraged to attend Cardiac Rehabilitation for the exercise;Relaxation education Stress management education;Encouraged to attend Cardiac Rehabilitation for the exercise;Relaxation education      Continue Psychosocial Services  Follow up required by staff Follow up required by staff        Initial Review   Source of Stress Concerns Chronic Illness;Unable to participate in former interests or hobbies;Unable to perform yard/household activities Chronic Illness;Unable to participate in former interests or hobbies;Unable to perform yard/household activities      Comments Will continue to monitor and offer support as needed Will continue to monitor and offer support as needed         Psychosocial Discharge (Final Psychosocial Re-Evaluation):  Psychosocial Re-Evaluation - 08/23/23 1803       Psychosocial Re-Evaluation   Current issues with Current Stress Concerns;Current Depression    Comments Trude has not voiced any increased concerns or stressors during exercise. last day of attendance was on 08/15/23    Expected Outcomes Eulalah will have controlled or decreased depression/ stressors upon completion of cardiac rehab    Interventions Stress management education;Encouraged to attend Cardiac Rehabilitation for the exercise;Relaxation education    Continue Psychosocial Services  Follow up required by staff      Initial Review   Source of Stress Concerns Chronic Illness;Unable to participate in  former interests or hobbies;Unable to perform yard/household activities    Comments Will continue to monitor and offer support as needed          Vocational Rehabilitation: Provide vocational rehab assistance to qualifying candidates.   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          Education: Education Goals: Education classes will be provided on a weekly basis, covering required topics. Participant will state understanding/return demonstration of topics presented.    Education     Row Name 08/03/23 1600     Education   Cardiac Education Topics [employer]   Orthoptist   Educator Dietitian   Weekly Topic [employer]   Instruction Review Code 1- Verbalizes Understanding   Class Start Time 1400   Class Stop Time 1437   Class Time Calculation (min) 37 min    Row Name 08/10/23 1200     Education   Cardiac Education Topics Pritikin   Customer service manager   Weekly Topic Efficiency Cooking - Meals in a Snap   Instruction Review Code 1- Verbalizes Understanding   Class Start Time 1400   Class Stop Time 1442   Class Time Calculation (min) 42 min    Row Name 08/12/23 1400     Education   Cardiac Education Topics Pritikin   Psychologist, forensic Exercise Education   Exercise Education Move It!   Instruction Review Code 1- Verbalizes Understanding   Class Start Time 1402   Class Stop Time 1437   Class  Time Calculation (min) 35 min      Core Videos: Exercise    Move It!  Clinical staff conducted group or individual video education with verbal and written material  and guidebook.  Patient learns the recommended Pritikin exercise program. Exercise with the goal of living a long, healthy life. Some of the health benefits of exercise include controlled diabetes, healthier blood pressure levels, improved cholesterol levels, improved heart and lung capacity, improved sleep, and better body composition. Everyone should speak with their doctor before starting or changing an exercise routine.  Biomechanical Limitations Clinical staff conducted group or individual video education with verbal and written material and guidebook.  Patient learns how biomechanical limitations can impact exercise and how we can mitigate and possibly overcome limitations to have an impactful and balanced exercise routine.  Body Composition Clinical staff conducted group or individual video education with verbal and written material and guidebook.  Patient learns that body composition (ratio of muscle mass to fat mass) is a key component to assessing overall fitness, rather than body weight alone. Increased fat mass, especially visceral belly fat, can put us  at increased risk for metabolic syndrome, type 2 diabetes, heart disease, and even death. It is recommended to combine diet and exercise (cardiovascular and resistance training) to improve your body composition. Seek guidance from your physician and exercise physiologist before implementing an exercise routine.  Exercise Action Plan Clinical staff conducted group or individual video education with verbal and written material and guidebook.  Patient learns the recommended strategies to achieve and enjoy long-term exercise adherence, including variety, self-motivation, self-efficacy, and positive decision making. Benefits of exercise include fitness, good health, weight management, more energy, better sleep, less stress, and overall well-being.  Medical   Heart Disease Risk Reduction Clinical staff conducted group or individual video  education with verbal and written material and guidebook.  Patient learns our heart is our most vital organ as it circulates oxygen, nutrients, white blood cells, and hormones throughout the entire body, and carries waste away. Data supports a plant-based eating plan like the Pritikin Program for its effectiveness in slowing progression of and reversing heart disease. The video provides a number of recommendations to address heart disease.   Metabolic Syndrome and Belly Fat  Clinical staff conducted group or individual video education with verbal and written material and guidebook.  Patient learns what metabolic syndrome is, how it leads to heart disease, and how one can reverse it and keep it from coming back. You have metabolic syndrome if you have 3 of the following 5 criteria: abdominal obesity, high blood pressure, high triglycerides, low HDL cholesterol, and high blood sugar.  Hypertension and Heart Disease Clinical staff conducted group or individual video education with verbal and written material and guidebook.  Patient learns that high blood pressure, or hypertension, is very common in the [place] . Hypertension is largely due to excessive salt intake, but other important risk factors include being overweight, physical inactivity, drinking too much alcohol, smoking, and not eating enough potassium from fruits and vegetables. High blood pressure is a leading risk factor for heart attack, stroke, congestive heart failure, dementia, kidney failure, and premature death. Long-term effects of excessive salt intake include stiffening of the arteries and thickening of heart muscle and organ damage. Recommendations include ways to reduce hypertension and the risk of heart disease.  Diseases of Our Time - Focusing on Diabetes Clinical staff conducted group or individual video education with verbal and written material and guidebook.  Patient learns  why the best way to stop diseases of our time is  prevention, through food and other lifestyle changes. Medicine (such as prescription pills and surgeries) is often only a Band-Aid on the problem, not a long-term solution. Most common diseases of our time include obesity, type 2 diabetes, hypertension, heart disease, and cancer. The Pritikin Program is recommended and has been proven to help reduce, reverse, and/or prevent the damaging effects of metabolic syndrome.  Nutrition   Overview of the Pritikin Eating Plan  Clinical staff conducted group or individual video education with verbal and written material and guidebook.  Patient learns about the Pritikin Eating Plan for disease risk reduction. The Pritikin Eating Plan emphasizes a wide variety of unrefined, minimally-processed carbohydrates, like fruits, vegetables, whole grains, and legumes. Go, Caution, and Stop food choices are explained. Plant-based and lean animal proteins are emphasized. Rationale provided for low sodium intake for blood pressure control, low added sugars for blood sugar stabilization, and low added fats and oils for coronary artery disease risk reduction and weight management.  Calorie Density  Clinical staff conducted group or individual video education with verbal and written material and guidebook.  Patient learns about calorie density and how it impacts the Pritikin Eating Plan. Knowing the characteristics of the food you choose will help you decide whether those foods will lead to weight gain or weight loss, and whether you want to consume more or less of them. Weight loss is usually a side effect of the Pritikin Eating Plan because of its focus on low calorie-dense foods.  Label Reading  Clinical staff conducted group or individual video education with verbal and written material and guidebook.  Patient learns about the Pritikin recommended label reading guidelines and corresponding recommendations regarding calorie density, added sugars, sodium content, and whole  grains.  Dining Out - Part 1  Clinical staff conducted group or individual video education with verbal and written material and guidebook.  Patient learns that restaurant meals can be sabotaging because they can be so high in calories, fat, sodium, and/or sugar. Patient learns recommended strategies on how to positively address this and avoid unhealthy pitfalls.  Facts on Fats  Clinical staff conducted group or individual video education with verbal and written material and guidebook.  Patient learns that lifestyle modifications can be just as effective, if not more so, as many medications for lowering your risk of heart disease. A Pritikin lifestyle can help to reduce your risk of inflammation and atherosclerosis (cholesterol build-up, or plaque, in the artery walls). Lifestyle interventions such as dietary choices and physical activity address the cause of atherosclerosis. A review of the types of fats and their impact on blood cholesterol levels, along with dietary recommendations to reduce fat intake is also included.  Nutrition Action Plan  Clinical staff conducted group or individual video education with verbal and written material and guidebook.  Patient learns how to incorporate Pritikin recommendations into their lifestyle. Recommendations include planning and keeping personal health goals in mind as an important part of their success.  Healthy Mind-Set    Healthy Minds, Bodies, Hearts  Clinical staff conducted group or individual video education with verbal and written material and guidebook.  Patient learns how to identify when they are stressed. Video will discuss the impact of that stress, as well as the many benefits of stress management. Patient will also be introduced to stress management techniques. The way we think, act, and feel has an impact on our hearts.  How Our Thoughts Can Heal  Our Hearts  Clinical staff conducted group or individual video education with verbal and  written material and guidebook.  Patient learns that negative thoughts can cause depression and anxiety. This can result in negative lifestyle behavior and serious health problems. Cognitive behavioral therapy is an effective method to help control our thoughts in order to change and improve our emotional outlook.  Additional Videos:  Exercise    Improving Performance  Clinical staff conducted group or individual video education with verbal and written material and guidebook.  Patient learns to use a non-linear approach by alternating intensity levels and lengths of time spent exercising to help burn more calories and lose more body fat. Cardiovascular exercise helps improve heart health, metabolism, hormonal balance, blood sugar control, and recovery from fatigue. Resistance training improves strength, endurance, balance, coordination, reaction time, metabolism, and muscle mass. Flexibility exercise improves circulation, posture, and balance. Seek guidance from your physician and exercise physiologist before implementing an exercise routine and learn your capabilities and proper form for all exercise.  Introduction to Yoga  Clinical staff conducted group or individual video education with verbal and written material and guidebook.  Patient learns about yoga, a discipline of the coming together of mind, breath, and body. The benefits of yoga include improved flexibility, improved range of motion, better posture and core strength, increased lung function, weight loss, and positive self-image. Yoga's heart health benefits include lowered blood pressure, healthier heart rate, decreased cholesterol and triglyceride levels, improved immune function, and reduced stress. Seek guidance from your physician and exercise physiologist before implementing an exercise routine and learn your capabilities and proper form for all exercise.  Medical   Aging: Enhancing Your Quality of Life  Clinical staff conducted  group or individual video education with verbal and written material and guidebook.  Patient learns key strategies and recommendations to stay in good physical health and enhance quality of life, such as prevention strategies, having an advocate, securing a Health Care Proxy and Power of Attorney, and keeping a list of medications and system for tracking them. It also discusses how to avoid risk for bone loss.  Biology of Weight Control  Clinical staff conducted group or individual video education with verbal and written material and guidebook.  Patient learns that weight gain occurs because we consume more calories than we burn (eating more, moving less). Even if your body weight is normal, you may have higher ratios of fat compared to muscle mass. Too much body fat puts you at increased risk for cardiovascular disease, heart attack, stroke, type 2 diabetes, and obesity-related cancers. In addition to exercise, following the Pritikin Eating Plan can help reduce your risk.  Decoding Lab Results  Clinical staff conducted group or individual video education with verbal and written material and guidebook.  Patient learns that lab test reflects one measurement whose values change over time and are influenced by many factors, including medication, stress, sleep, exercise, food, hydration, pre-existing medical conditions, and more. It is recommended to use the knowledge from this video to become more involved with your lab results and evaluate your numbers to speak with your doctor.   Diseases of Our Time - Overview  Clinical staff conducted group or individual video education with verbal and written material and guidebook.  Patient learns that according to the CDC, 50% to 70% of chronic diseases (such as obesity, type 2 diabetes, elevated lipids, hypertension, and heart disease) are avoidable through lifestyle improvements including healthier food choices, listening to satiety cues, and increased physical  activity.  Sleep Disorders Clinical staff conducted group or individual video education with verbal and written material and guidebook.  Patient learns how good quality and duration of sleep are important to overall health and well-being. Patient also learns about sleep disorders and how they impact health along with recommendations to address them, including discussing with a physician.  Nutrition  Dining Out - Part 2 Clinical staff conducted group or individual video education with verbal and written material and guidebook.  Patient learns how to plan ahead and communicate in order to maximize their dining experience in a healthy and nutritious manner. Included are recommended food choices based on the type of restaurant the patient is visiting.   Fueling a Banker conducted group or individual video education with verbal and written material and guidebook.  There is a strong connection between our food choices and our health. Diseases like obesity and type 2 diabetes are very prevalent and are in large-part due to lifestyle choices. The Pritikin Eating Plan provides plenty of food and hunger-curbing satisfaction. It is easy to follow, affordable, and helps reduce health risks.  Menu Workshop  Clinical staff conducted group or individual video education with verbal and written material and guidebook.  Patient learns that restaurant meals can sabotage health goals because they are often packed with calories, fat, sodium, and sugar. Recommendations include strategies to plan ahead and to communicate with the manager, chef, or server to help order a healthier meal.  Planning Your Eating Strategy  Clinical staff conducted group or individual video education with verbal and written material and guidebook.  Patient learns about the Pritikin Eating Plan and its benefit of reducing the risk of disease. The Pritikin Eating Plan does not focus on calories. Instead, it emphasizes  high-quality, nutrient-rich foods. By knowing the characteristics of the foods, we choose, we can determine their calorie density and make informed decisions.  Targeting Your Nutrition Priorities  Clinical staff conducted group or individual video education with verbal and written material and guidebook.  Patient learns that lifestyle habits have a tremendous impact on disease risk and progression. This video provides eating and physical activity recommendations based on your personal health goals, such as reducing LDL cholesterol, losing weight, preventing or controlling type 2 diabetes, and reducing high blood pressure.  Vitamins and Minerals  Clinical staff conducted group or individual video education with verbal and written material and guidebook.  Patient learns different ways to obtain key vitamins and minerals, including through a recommended healthy diet. It is important to discuss all supplements you take with your doctor.   Healthy Mind-Set    Smoking Cessation  Clinical staff conducted group or individual video education with verbal and written material and guidebook.  Patient learns that cigarette smoking and tobacco addiction pose a serious health risk which affects millions of people. Stopping smoking will significantly reduce the risk of heart disease, lung disease, and many forms of cancer. Recommended strategies for quitting are covered, including working with your doctor to develop a successful plan.  Culinary   Becoming a Set designer conducted group or individual video education with verbal and written material and guidebook.  Patient learns that cooking at home can be healthy, cost-effective, quick, and puts them in control. Keys to cooking healthy recipes will include looking at your recipe, assessing your equipment needs, planning ahead, making it simple, choosing cost-effective seasonal ingredients, and limiting the use of added fats, salts, and  sugars.  Cooking -  Breakfast and Snacks  Clinical staff conducted group or individual video education with verbal and written material and guidebook.  Patient learns how important breakfast is to satiety and nutrition through the entire day. Recommendations include key foods to eat during breakfast to help stabilize blood sugar levels and to prevent overeating at meals later in the day. Planning ahead is also a key component.  Cooking - Educational psychologist conducted group or individual video education with verbal and written material and guidebook.  Patient learns eating strategies to improve overall health, including an approach to cook more at home. Recommendations include thinking of animal protein as a side on your plate rather than center stage and focusing instead on lower calorie dense options like vegetables, fruits, whole grains, and plant-based proteins, such as beans. Making sauces in large quantities to freeze for later and leaving the skin on your vegetables are also recommended to maximize your experience.  Cooking - Healthy Salads and Dressing Clinical staff conducted group or individual video education with verbal and written material and guidebook.  Patient learns that vegetables, fruits, whole grains, and legumes are the foundations of the Pritikin Eating Plan. Recommendations include how to incorporate each of these in flavorful and healthy salads, and how to create homemade salad dressings. Proper handling of ingredients is also covered. Cooking - Soups and [employer] - Soups and Desserts Clinical staff conducted group or individual video education with verbal and written material and guidebook.  Patient learns that Pritikin soups and desserts make for easy, nutritious, and delicious snacks and meal components that are low in sodium, fat, sugar, and calorie density, while high in vitamins, minerals, and filling fiber. Recommendations include simple and healthy  ideas for soups and desserts.   Overview     The Pritikin Solution Program Overview Clinical staff conducted group or individual video education with verbal and written material and guidebook.  Patient learns that the results of the Pritikin Program have been documented in more than 100 articles published in peer-reviewed journals, and the benefits include reducing risk factors for (and, in some cases, even reversing) high cholesterol, high blood pressure, type 2 diabetes, obesity, and more! An overview of the three key pillars of the Pritikin Program will be covered: eating well, doing regular exercise, and having a healthy mind-set.  WORKSHOPS  Exercise: Exercise Basics: Building Your Action Plan Clinical staff led group instruction and group discussion with PowerPoint presentation and patient guidebook. To enhance the learning environment the use of posters, models and videos may be added. At the conclusion of this workshop, patients will comprehend the difference between physical activity and exercise, as well as the benefits of incorporating both, into their routine. Patients will understand the FITT (Frequency, Intensity, Time, and Type) principle and how to use it to build an exercise action plan. In addition, safety concerns and other considerations for exercise and cardiac rehab will be addressed by the presenter. The purpose of this lesson is to promote a comprehensive and effective weekly exercise routine in order to improve patients' overall level of fitness.   Managing Heart Disease: Your Path to a Healthier Heart Clinical staff led group instruction and group discussion with PowerPoint presentation and patient guidebook. To enhance the learning environment the use of posters, models and videos may be added.At the conclusion of this workshop, patients will understand the anatomy and physiology of the heart. Additionally, they will understand how Pritikin's three pillars impact the  risk factors, the progression,  and the management of heart disease.  The purpose of this lesson is to provide a high-level overview of the heart, heart disease, and how the Pritikin lifestyle positively impacts risk factors.  Exercise Biomechanics Clinical staff led group instruction and group discussion with PowerPoint presentation and patient guidebook. To enhance the learning environment the use of posters, models and videos may be added. Patients will learn how the structural parts of their bodies function and how these functions impact their daily activities, movement, and exercise. Patients will learn how to promote a neutral spine, learn how to manage pain, and identify ways to improve their physical movement in order to promote healthy living. The purpose of this lesson is to expose patients to common physical limitations that impact physical activity. Participants will learn practical ways to adapt and manage aches and pains, and to minimize their effect on regular exercise. Patients will learn how to maintain good posture while sitting, walking, and lifting.  Balance Training and Fall Prevention  Clinical staff led group instruction and group discussion with PowerPoint presentation and patient guidebook. To enhance the learning environment the use of posters, models and videos may be added. At the conclusion of this workshop, patients will understand the importance of their sensorimotor skills (vision, proprioception, and the vestibular system) in maintaining their ability to balance as they age. Patients will apply a variety of balancing exercises that are appropriate for their current level of function. Patients will understand the common causes for poor balance, possible solutions to these problems, and ways to modify their physical environment in order to minimize their fall risk. The purpose of this lesson is to teach patients about the importance of maintaining balance as they age  and ways to minimize their risk of falling.  WORKSHOPS   Nutrition:  Fueling a Ship broker led group instruction and group discussion with PowerPoint presentation and patient guidebook. To enhance the learning environment the use of posters, models and videos may be added. Patients will review the foundational principles of the Pritikin Eating Plan and understand what constitutes a serving size in each of the food groups. Patients will also learn Pritikin-friendly foods that are better choices when away from home and review make-ahead meal and snack options. Calorie density will be reviewed and applied to three nutrition priorities: weight maintenance, weight loss, and weight gain. The purpose of this lesson is to reinforce (in a group setting) the key concepts around what patients are recommended to eat and how to apply these guidelines when away from home by planning and selecting Pritikin-friendly options. Patients will understand how calorie density may be adjusted for different weight management goals.  Mindful Eating  Clinical staff led group instruction and group discussion with PowerPoint presentation and patient guidebook. To enhance the learning environment the use of posters, models and videos may be added. Patients will briefly review the concepts of the Pritikin Eating Plan and the importance of low-calorie dense foods. The concept of mindful eating will be introduced as well as the importance of paying attention to internal hunger signals. Triggers for non-hunger eating and techniques for dealing with triggers will be explored. The purpose of this lesson is to provide patients with the opportunity to review the basic principles of the Pritikin Eating Plan, discuss the value of eating mindfully and how to measure internal cues of hunger and fullness using the Hunger Scale. Patients will also discuss reasons for non-hunger eating and learn strategies to use for controlling  emotional eating.  Targeting Your Nutrition Priorities Clinical staff led group instruction and group discussion with PowerPoint presentation and patient guidebook. To enhance the learning environment the use of posters, models and videos may be added. Patients will learn how to determine their genetic susceptibility to disease by reviewing their family history. Patients will gain insight into the importance of diet as part of an overall healthy lifestyle in mitigating the impact of genetics and other environmental insults. The purpose of this lesson is to provide patients with the opportunity to assess their personal nutrition priorities by looking at their family history, their own health history and current risk factors. Patients will also be able to discuss ways of prioritizing and modifying the [facility] Eating Plan for their highest risk areas  Menu  Clinical staff led group instruction and group discussion with PowerPoint presentation and patient guidebook. To enhance the learning environment the use of posters, models and videos may be added. Using menus brought in from [employer], or printed from [employer], patients will apply the [facility] dining out guidelines that were presented in the [employer] video. Patients will also be able to practice these guidelines in a variety of provided scenarios. The purpose of this lesson is to provide patients with the opportunity to practice hands-on learning of the [facility] Dining Out guidelines with actual menus and practice scenarios.  Label Reading Clinical staff led group instruction and group discussion with PowerPoint presentation and patient guidebook. To enhance the learning environment the use of posters, models and videos may be added. Patients will review and discuss the [facility] label reading guidelines presented in [employer]'s Label Reading Educational series video. Using fool labels brought in from local grocery stores  and markets, patients will apply the label reading guidelines and determine if the packaged food meet the [facility] guidelines. The purpose of this lesson is to provide patients with the opportunity to review, discuss, and practice hands-on learning of the [facility] Label Reading guidelines with actual packaged food labels. Cooking School  [employer]'s [employer] are designed to teach patients ways to prepare quick, simple, and affordable recipes at home. The importance of nutrition's role in chronic disease risk reduction is reflected in its emphasis in the overall [facility] program. By learning how to prepare essential core [facility] Eating Plan recipes, patients will increase control over what they eat; be able to customize the flavor of foods without the use of added salt, sugar, or fat; and improve the quality of the food they consume. By learning a set of core recipes which are easily assembled, quickly prepared, and affordable, patients are more likely to prepare more healthy foods at home. These workshops focus on convenient breakfasts, simple entres, side dishes, and desserts which can be prepared with minimal effort and are consistent with nutrition recommendations for cardiovascular risk reduction. Cooking [employer] are taught by a Armed forces logistics/support/administrative officer (RD) who has been trained by the [employer]. The chef or RD has a clear understanding of the importance of minimizing - if not completely eliminating - added fat, sugar, and sodium in recipes. Throughout the series of Cooking School Workshop sessions, patients will learn about healthy ingredients and efficient methods of cooking to build confidence in their capability to prepare    Cooking School weekly topics:  Adding Flavor- Sodium-Free  Fast and Healthy Breakfasts  Powerhouse Plant-Based Proteins  Satisfying Salads and Dressings  Simple Sides and Sauces  International Cuisine-Spotlight on the [employer]  Savory Soups  Efficiency Cooking - Meals in a Hydrologist and Snacks  Comforting Weekend Breakfasts  One-Pot Wonders   Fast [employer] Your Pritikin Plate  WORKSHOPS   Healthy Mindset (Psychosocial):  Focused Goals, Sustainable Changes Clinical staff led group instruction and group discussion with PowerPoint presentation and patient guidebook. To enhance the learning environment the use of posters, models and videos may be added. Patients will be able to apply effective goal setting strategies to establish at least one personal goal, and then take consistent, meaningful action toward that goal. They will learn to identify common barriers to achieving personal goals and develop strategies to overcome them. Patients will also gain an understanding of how our mind-set can impact our ability to achieve goals and the importance of cultivating a positive and growth-oriented mind-set. The purpose of this lesson is to provide patients with a deeper understanding of how to set and achieve personal goals, as well as the tools and strategies needed to overcome common obstacles which may arise along the way.  From Head to Heart: The Power of a Healthy Outlook  Clinical staff led group instruction and group discussion with PowerPoint presentation and patient guidebook. To enhance the learning environment the use of posters, models and videos may be added. Patients will be able to recognize and describe the impact of emotions and mood on physical health. They will discover the importance of self-care and explore self-care practices which may work for them. Patients will also learn how to utilize the 4 C's to cultivate a healthier outlook and better manage stress and challenges. The purpose of this lesson is to demonstrate to patients how a healthy outlook is an essential part of maintaining good health, especially as they continue their  cardiac rehab journey.  Healthy Sleep for a Healthy Heart Clinical staff led group instruction and group discussion with PowerPoint presentation and patient guidebook. To enhance the learning environment the use of posters, models and videos may be added. At the conclusion of this workshop, patients will be able to demonstrate knowledge of the importance of sleep to overall health, well-being, and quality of life. They will understand the symptoms of, and treatments for, common sleep disorders. Patients will also be able to identify daytime and nighttime behaviors which impact sleep, and they will be able to apply these tools to help manage sleep-related challenges. The purpose of this lesson is to provide patients with a general overview of sleep and outline the importance of quality sleep. Patients will learn about a few of the most common sleep disorders. Patients will also be introduced to the concept of "sleep hygiene," and discover ways to self-manage certain sleeping problems through simple daily behavior changes. Finally, the workshop will motivate patients by clarifying the links between quality sleep and their goals of heart-healthy living.   Recognizing and Reducing Stress Clinical staff led group instruction and group discussion with PowerPoint presentation and patient guidebook. To enhance the learning environment the use of posters, models and videos may be added. At the conclusion of this workshop, patients will be able to understand the types of stress reactions, differentiate between acute and chronic stress, and recognize the impact that chronic stress has on their health. They will also be able to apply different coping mechanisms, such as reframing negative self-talk. Patients will have the opportunity to practice a variety of stress management techniques, such as deep abdominal breathing, progressive muscle relaxation, and/or guided imagery.  The  purpose of this lesson is to educate  patients on the role of stress in their lives and to provide healthy techniques for coping with it.  Learning Barriers/Preferences:  Learning Barriers/Preferences - 07/26/23 1327       Learning Barriers/Preferences   Learning Barriers Sight   glaucoma   Learning Preferences Computer/Internet          Education Topics:  Knowledge Questionnaire Score:  Knowledge Questionnaire Score - 07/27/23 0834       Knowledge Questionnaire Score   Pre Score 22/24          Core Components/Risk Factors/Patient Goals at Admission:  Personal Goals and Risk Factors at Admission - 07/26/23 1343       Core Components/Risk Factors/Patient Goals on Admission    Weight Management Yes;Obesity;Weight Loss    Intervention Weight Management/Obesity: Establish reasonable short term and long term weight goals.;Obesity: Provide education and appropriate resources to help participant work on and attain dietary goals.    Admit Weight 222 lb 0.1 oz (100.7 kg)    Expected Outcomes Short Term: Continue to assess and modify interventions until short term weight is achieved;Long Term: Adherence to nutrition and physical activity/exercise program aimed toward attainment of established weight goal;Weight Loss: Understanding of general recommendations for a balanced deficit meal plan, which promotes 1-2 lb weight loss per week and includes a negative energy balance of [phone] kcal/d    Diabetes Yes    Intervention Provide education about signs/symptoms and action to take for hypo/hyperglycemia.;Provide education about proper nutrition, including hydration, and aerobic/resistive exercise prescription along with prescribed medications to achieve blood glucose in normal ranges: Fasting glucose 65-99 mg/dL    Expected Outcomes Short Term: Participant verbalizes understanding of the signs/symptoms and immediate care of hyper/hypoglycemia, proper foot care and importance of medication, aerobic/resistive exercise and  nutrition plan for blood glucose control.;Long Term: Attainment of HbA1C < 7%.    Heart Failure Yes    Intervention Provide a combined exercise and nutrition program that is supplemented with education, support and counseling about heart failure. Directed toward relieving symptoms such as shortness of breath, decreased exercise tolerance, and extremity edema.    Expected Outcomes Improve functional capacity of life;Short term: Attendance in program 2-3 days a week with increased exercise capacity. Reported lower sodium intake. Reported increased fruit and vegetable intake. Reports medication compliance.;Short term: Daily weights obtained and reported for increase. Utilizing diuretic protocols set by physician.;Long term: Adoption of self-care skills and reduction of barriers for early signs and symptoms recognition and intervention leading to self-care maintenance.    Hypertension Yes    Intervention Monitor prescription use compliance.;Provide education on lifestyle modifcations including regular physical activity/exercise, weight management, moderate sodium restriction and increased consumption of fresh fruit, vegetables, and low fat dairy, alcohol moderation, and smoking cessation.    Expected Outcomes Short Term: Continued assessment and intervention until BP is < 140/25mm HG in hypertensive participants. < 130/49mm HG in hypertensive participants with diabetes, heart failure or chronic kidney disease.;Long Term: Maintenance of blood pressure at goal levels.    Lipids Yes    Intervention Provide education and support for participant on nutrition & aerobic/resistive exercise along with prescribed medications to achieve LDL 70mg , HDL >40mg .    Expected Outcomes Short Term: Participant states understanding of desired cholesterol values and is compliant with medications prescribed. Participant is following exercise prescription and nutrition guidelines.;Long Term: Cholesterol controlled with medications as  prescribed, with individualized exercise RX and with personalized nutrition plan. Value goals: LDL <  70mg , HDL > 40 mg.    Stress Yes    Intervention Offer individual and/or small group education and counseling on adjustment to heart disease, stress management and health-related lifestyle change. Teach and support self-help strategies.;Refer participants experiencing significant psychosocial distress to appropriate mental health specialists for further evaluation and treatment. When possible, include family members and significant others in education/counseling sessions.    Expected Outcomes Short Term: Participant demonstrates changes in health-related behavior, relaxation and other stress management skills, ability to obtain effective social support, and compliance with psychotropic medications if prescribed.;Long Term: Emotional wellbeing is indicated by absence of clinically significant psychosocial distress or social isolation.          Core Components/Risk Factors/Patient Goals Review:   Goals and Risk Factor Review     Row Name 08/11/23 1611 08/23/23 1804           Core Components/Risk Factors/Patient Goals Review   Personal Goals Review Heart Failure;Hypertension;Lipids;Stress Heart Failure;Hypertension;Lipids;Stress      Review Bridgit started cardiac rehab on 08/03/23 and is off to a good start with exercise. Vital signs have been stable. patient was hypglycemic on 08/05/23. Patient's PCP, Dr Elnita Hai decreased insulin  on exercise days and ordered CGM for the patient which she is usiing now. Emily is off to a good start to exercise at cardiac rehabVital signs have been stable. CBG's have been variable.      Expected Outcomes Izzie will continue to participate in cardiac rehab for exercise, nutrition and lifestyle modifications Aniyla will continue to participate in cardiac rehab for exercise, nutrition and lifestyle modifications         Core Components/Risk Factors/Patient  Goals at Discharge (Final Review):   Goals and Risk Factor Review - 08/23/23 1804       Core Components/Risk Factors/Patient Goals Review   Personal Goals Review Heart Failure;Hypertension;Lipids;Stress    Review Ashantia is off to a good start to exercise at cardiac rehabVital signs have been stable. CBG's have been variable.    Expected Outcomes Iretta will continue to participate in cardiac rehab for exercise, nutrition and lifestyle modifications          ITP Comments:  ITP Comments     Row Name 07/26/23 1259 08/05/23 1711 08/23/23 1802       ITP Comments Medical Director- Dr. Gaylyn Keas, MD. Introduction to [facility] Education / Intensive Cardiac Rehab. Reviewed initial orientation folder. 30 Day ITP Review. Latricia started cardiac rehab on 08/03/23 and is off to a good start to exercise for her fitness level 30 Day ITP Review. Stashia has been out due to lower back pain.  cardiac rehab        Comments: See ITP comments.Monte Antonio RN BSN

## 2023-08-23 NOTE — Telephone Encounter (Signed)
*  STAT* If patient is at the pharmacy, call can be transferred to refill team.   1. Which medications need to be refilled? (please list name of each medication and dose if known)   isosorbide  mononitrate (IMDUR ) 60 MG 24 hr tablet   2. Would you like to learn more about the convenience, safety, & potential cost savings by using the [facility]?   3. Are you open to using the [facility] (Type [facility]. ).  4. Which pharmacy/location (including street and city if local pharmacy) is medication to be sent to?  [id number] - [place], [place] - [address] AT [address]   5. Do they need a 30 day or 90 day supply?  30 day  Patient stated she only has 3 tablets left.

## 2023-08-24 ENCOUNTER — Encounter (HOSPITAL_COMMUNITY)

## 2023-08-24 ENCOUNTER — Ambulatory Visit: Payer: Medicare PPO | Admitting: Internal Medicine

## 2023-08-24 ENCOUNTER — Encounter: Payer: Self-pay | Admitting: Internal Medicine

## 2023-08-24 ENCOUNTER — Ambulatory Visit: Payer: Self-pay | Admitting: Internal Medicine

## 2023-08-24 VITALS — BP 142/84 | HR 63 | Temp 98.7°F | Ht 64.0 in | Wt 220.0 lb

## 2023-08-24 DIAGNOSIS — E66812 Obesity, class 2: Secondary | ICD-10-CM

## 2023-08-24 DIAGNOSIS — K5909 Other constipation: Secondary | ICD-10-CM | POA: Diagnosis not present

## 2023-08-24 DIAGNOSIS — K219 Gastro-esophageal reflux disease without esophagitis: Secondary | ICD-10-CM

## 2023-08-24 DIAGNOSIS — N1831 Chronic kidney disease, stage 3a: Secondary | ICD-10-CM | POA: Diagnosis not present

## 2023-08-24 DIAGNOSIS — I130 Hypertensive heart and chronic kidney disease with heart failure and stage 1 through stage 4 chronic kidney disease, or unspecified chronic kidney disease: Secondary | ICD-10-CM | POA: Diagnosis not present

## 2023-08-24 DIAGNOSIS — E1122 Type 2 diabetes mellitus with diabetic chronic kidney disease: Secondary | ICD-10-CM | POA: Diagnosis not present

## 2023-08-24 DIAGNOSIS — M545 Low back pain, unspecified: Secondary | ICD-10-CM | POA: Diagnosis not present

## 2023-08-24 DIAGNOSIS — I5032 Chronic diastolic (congestive) heart failure: Secondary | ICD-10-CM

## 2023-08-24 DIAGNOSIS — R109 Unspecified abdominal pain: Secondary | ICD-10-CM

## 2023-08-24 DIAGNOSIS — Z794 Long term (current) use of insulin: Secondary | ICD-10-CM

## 2023-08-24 DIAGNOSIS — M1A09X0 Idiopathic chronic gout, multiple sites, without tophus (tophi): Secondary | ICD-10-CM

## 2023-08-24 DIAGNOSIS — Z6837 Body mass index (BMI) 37.0-37.9, adult: Secondary | ICD-10-CM

## 2023-08-24 LAB — POCT URINALYSIS DIPSTICK
Bilirubin, UA: NEGATIVE
Glucose, UA: NEGATIVE
Ketones, UA: NEGATIVE
Leukocytes, UA: NEGATIVE
Nitrite, UA: NEGATIVE
Protein, UA: POSITIVE — AB
Spec Grav, UA: 1.02 (ref 1.010–1.025)
Urobilinogen, UA: 0.2 U/dL
pH, UA: 6 (ref 5.0–8.0)

## 2023-08-24 MED ORDER — POLYETHYLENE GLYCOL 3350 17 G PO PACK: 17.0000 g | PACK | Freq: Every day | ORAL | 0 refills | Status: AC

## 2023-08-24 MED ORDER — METHOCARBAMOL 500 MG PO TABS: 500.0000 mg | ORAL_TABLET | Freq: Three times a day (TID) | ORAL | 0 refills | Status: AC | PRN

## 2023-08-24 NOTE — Progress Notes (Addendum)
 I,Dawn Wood, CMA,acting as a neurosurgeon for Dawn LOISE Slocumb, MD.,have documented all relevant documentation on the behalf of Dawn LOISE Slocumb, MD,as directed by  Dawn LOISE Slocumb, MD while in the presence of Dawn LOISE Slocumb, MD.  Subjective:  Patient ID: Dawn Wood , female    DOB: 1945/09/16 , 78 y.o.   MRN: [id number]  Chief Complaint  Patient presents with   Diabetes    Patient presents today for dm & bp follow up. She reports compliance with medications. Denies headache, chest pain & sob. She complains of lower back pain. She states this started last Monday. She rates 9/10. The pain comes & goes. She takes tylenol  at home which helps for a little bit.  Letter sent to Dawn Wood for dm eye exam.   Hypertension    HPI Discussed the use of AI scribe software for clinical note transcription with the patient, who gave verbal consent to proceed.  History of Present Illness Dawn Wood is a 78 year old female with a history of kidney stones and arthritis who presents with acute lower back pain.  She has been experiencing sharp lower back pain since last Monday, which began when getting out of bed. The pain is sharp and primarily occurs when moving from a seated position or getting up. It occasionally radiates down the back of her leg but not significantly. She has tried Tylenol  without relief and has used [employer] for topical relief. No recent blood in urine or burning with urination. She has a history of kidney stones, which she has passed in the past, but does not recall specific treatment details.  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 She has not experienced back pain outside of episodes related to kidney stones and has not seen an orthopedic specialist for her back pain before.  She was previously seen by Dawn Wood in the past for knee issues.   She is currently taking cardiac rehab but has been unable to attend due to her back pain.  Her blood sugar levels drop during exercise, with a noted drop to 35 during a session.  She reports constipation and has not been having regular bowel movements. She has used Miralax  in the past but does not currently use any laxatives. She has not tried any muscle relaxers recently but has used Robaxin  in the past with some relief.   Diabetes She presents for her follow-up diabetic visit. She has type 2 diabetes mellitus. Her disease course has been stable. There are no hypoglycemic associated symptoms. Pertinent negatives for diabetes include no blurred vision, no polydipsia, no polyphagia and no polyuria. There are no hypoglycemic complications. Diabetic complications include nephropathy. Risk factors for coronary artery disease include diabetes mellitus, dyslipidemia, hypertension, obesity, sedentary lifestyle and post-menopausal. Current diabetic treatment includes insulin  injections. She is following a diabetic diet. She participates in exercise intermittently. Her home blood glucose trend is fluctuating minimally. Her breakfast blood glucose is taken between 8-9 am. Her breakfast blood glucose range is generally 110-130 mg/dl. Eye exam is current.  Hypertension This is a chronic problem. The current episode started more than 1 year ago. The problem has been gradually improving since onset. Pertinent negatives include no blurred vision. Risk factors for coronary artery disease include diabetes mellitus, dyslipidemia, sedentary lifestyle and post-menopausal state. Hypertensive end-organ damage includes kidney disease.     Past Medical History:  Diagnosis Date   Asthma    Atrial fibrillation (  HCC)    CKD (chronic kidney disease)    Coronary artery disease    Diabetes mellitus without complication (HCC)    GERD (gastroesophageal reflux disease)    Heart attack (HCC) 12/2022   Hypertension    MI (myocardial infarction) (HCC)    Peripheral vascular disease (HCC)    Sleep apnea      Family  History  Problem Relation Age of Onset   Hypertension Mother    Hypertension Father    Throat cancer Father    Breast cancer Sister 82   Aortic stenosis Daughter    Heart disease Daughter        before age 85   Thyroid  disease Daughter    Sleep apnea Neg Hx      Current Outpatient Medications:    Accu-Chek Softclix Lancets lancets, USE TO CHECK BLOOD SUGARS ONCE DAILY., Disp: 100 each, Rfl: 12   albuterol  (VENTOLIN  HFA) 108 (90 Base) MCG/ACT inhaler, Inhale 2 puffs into the lungs every 6 (six) hours as needed for wheezing or shortness of breath., Disp: 8 g, Rfl: 2   BD PEN NEEDLE NANO 2ND GEN 32G X 4 MM MISC, USE AS DIRECTED TWICE DAILY, Disp: 100 each, Rfl: 2   Blood Glucose Monitoring Suppl (ACCU-CHEK GUIDE) w/Device KIT, USE TO CHECK BLOOD SUGARS ONCE DAILY., Disp: 1 kit, Rfl: 0   budesonide -formoterol  (SYMBICORT ) 160-4.5 MCG/ACT inhaler, Inhale 2 puffs into the lungs 2 (two) times daily., Disp: 10.2 g, Rfl: 11   Cholecalciferol (VITAMIN D3) 2000 units TABS, Take 1 tablet by mouth daily., Disp: , Rfl:    colchicine  0.6 MG tablet, Take 1 tablet (0.6 mg total) by mouth as needed (for gout)., Disp: 30 tablet, Rfl: 1   Continuous Glucose Sensor (DEXCOM G7 SENSOR) MISC, Use daily to check blood sugars. Change every 10 days., Disp: 4 each, Rfl: 2   diltiazem  (CARDIZEM  CD) 180 MG 24 hr capsule, Take 1 capsule (180 mg total) by mouth daily., Disp: 90 capsule, Rfl: 1   dorzolamide -timolol  (COSOPT ) 2-0.5 % ophthalmic solution, Place 1 drop into the right eye 2 (two) times daily., Disp: , Rfl:    ELIQUIS  5 MG TABS tablet, TAKE 1 TABLET(5 MG) BY MOUTH TWICE DAILY, Disp: 180 tablet, Rfl: 1   Evolocumab  (REPATHA  SURECLICK) 140 MG/ML SOAJ, Inject 140 mg into the skin every 14 (fourteen) days., Disp: 6 mL, Rfl: 3   fluticasone  (FLONASE ) 50 MCG/ACT nasal spray, Place 2 sprays into both nostrils daily., Disp: 16 g, Rfl: 2   furosemide  (LASIX ) 20 MG tablet, TAKE 1 TABLET(20 MG) BY MOUTH DAILY, Disp: 30  tablet, Rfl: 10   glucose blood (ACCU-CHEK GUIDE) test strip, USE TO CHECK BLOOD SUGARS ONCE DAILY., Disp: 100 each, Rfl: 12   insulin  aspart protamine - aspart (NOVOLOG  MIX 70/30 FLEXPEN) (70-30) 100 UNIT/ML FlexPen, INJECT 40 UNITS SUBCUTANEOUS EVERY MORNING AND 50 UNITS SUBCUTANEOUS AT DINNER (Patient taking differently: INJECT 45 UNITS SUBCUTANEOUS EVERY MORNING AND 50 UNITS SUBCUTANEOUS AT DINNER), Disp: 30 mL, Rfl: 2   isosorbide  mononitrate (IMDUR ) 60 MG 24 hr tablet, Take 1 tablet (60 mg total) by mouth daily., Disp: 90 tablet, Rfl: 2   Latanoprostene Bunod  (VYZULTA ) 0.024 % SOLN, Place 1 drop into the right eye at bedtime., Disp: , Rfl:    methocarbamol  (ROBAXIN ) 500 MG tablet, Take 1 tablet (500 mg total) by mouth every 8 (eight) hours as needed for muscle spasms., Disp: 30 tablet, Rfl: 0   metoprolol  succinate (TOPROL -XL) 100 MG 24 hr tablet,  Take 1.5 tablets (150 mg total) by mouth daily. Take with or immediately following a meal., Disp: 135 tablet, Rfl: 3   nitroGLYCERIN  (NITROSTAT ) 0.4 MG SL tablet, Place 1 tablet (0.4 mg total) under the tongue every 5 (five) minutes as needed for chest pain., Disp: 25 tablet, Rfl: 11   olmesartan  (BENICAR ) 40 MG tablet, TAKE 1 TABLET(40 MG) BY MOUTH DAILY, Disp: 90 tablet, Rfl: 2   ONETOUCH VERIO test strip, USE TO CHECK BLOOD SUGARS TWICE DAILY AS DIRECTED, Disp: 100 strip, Rfl: 3   pantoprazole  (PROTONIX ) 40 MG tablet, TAKE 1 TABLET(40 MG) BY MOUTH DAILY, Disp: 90 tablet, Rfl: 1   polyethylene glycol (MIRALAX ) 17 g packet, Take 17 g by mouth daily., Disp: 30 each, Rfl: 0   potassium chloride  (KLOR-CON ) 10 MEQ tablet, Take 1 tablet (10 mEq total) by mouth at bedtime., Disp: , Rfl:    Semaglutide ,0.25 or 0.5MG /DOS, (OZEMPIC , 0.25 OR 0.5 MG/DOSE,) 2 MG/1.5ML SOPN, INJECT 0.5MG  INTO SKIN ONCE A WEEK, Disp: 4.5 mL, Rfl: 3   spironolactone  (ALDACTONE ) 25 MG tablet, TAKE 1 TABLET(25 MG) BY MOUTH DAILY, Disp: 90 tablet, Rfl: 1   Allergies  Allergen  Reactions   Dust Mite Extract Other (See Comments)    REACTION: SNEEZING   Other Other (See Comments)    ALLERGEN: DEODORIZERS SPRAY AND PERFUMES REACTION: SNEEZING   Statins Other (See Comments)    Muscle aches with rosuvastatin  5 mg daily and 3x/weekly, atorvastatin  20 mg daily and 3x/week, simvastatin 40 mg daily    Allopurinol      Diarrhea and the pt said it made her sugars low   Pollen Extract Other (See Comments)    Reaction unknown     Review of Systems  Constitutional: Negative.   Eyes:  Negative for blurred vision.  Respiratory: Negative.    Cardiovascular: Negative.   Gastrointestinal: Negative.   Endocrine: Negative for polydipsia, polyphagia and polyuria.  Musculoskeletal:  Positive for back pain.  Neurological: Negative.   Psychiatric/Behavioral: Negative.       Today's Vitals   08/24/23 1055 08/24/23 1126  BP: (!) 140/82 (!) 142/84  Pulse: 63   Temp: 98.7 F (37.1 C)   SpO2: 98%   Weight: 220 lb (99.8 kg)   Height: 5' 4 (1.626 m)    Body mass index is 37.76 kg/m.  Wt Readings from Last 3 Encounters:  08/24/23 220 lb (99.8 kg)  08/09/23 222 lb (100.7 kg)  07/26/23 222 lb 0.1 oz (100.7 kg)     Objective:  Physical Exam Vitals and nursing note reviewed.  Constitutional:      Appearance: Normal appearance.     Comments: Looks uncomfortable  HENT:     Head: Normocephalic and atraumatic.   Eyes:     Extraocular Movements: Extraocular movements intact.    Cardiovascular:     Rate and Rhythm: Normal rate and regular rhythm.     Heart sounds: Normal heart sounds.  Pulmonary:     Effort: Pulmonary effort is normal.     Breath sounds: Normal breath sounds.   Musculoskeletal:        General: Tenderness present.     Cervical back: Normal range of motion.   Skin:    General: Skin is warm.   Neurological:     General: No focal deficit present.     Mental Status: She is alert.   Psychiatric:        Mood and Affect: Mood normal.  Behavior: Behavior normal.       Assessment And Plan:  Type 2 diabetes mellitus with stage 3a chronic kidney disease, with long-term current use of insulin  [facility]) Assessment & Plan: Managed with insulin  and semaglutide . No issues discussed.  Chronic, she is encouraged to stay well hydrated, avoid NSAIDs and keep BP controlled to prevent progression of CKD.  Hypoglycemia during cardiac rehab with glucose at 35 mg/dL. Communication issues with glucose reporting. - Consider reducing diabetes medication dosage to 30 units on days of rehab - Encouraged MyChart use for glucose communication. - Suggested daughter access MyChart.   - Continue current insulin  regimen. - Continue semaglutide  (Ozempic ) 0.5 mg weekly.  Orders: -     POCT urinalysis dipstick -     Comprehensive metabolic panel with GFR -     Hemoglobin A1c  Hypertensive heart and renal disease with renal failure, stage 1 through stage 4 or unspecified chronic kidney disease, with heart failure (HCC) Assessment & Plan: Chronic, uncontrolled today. No med changes, she is in obvious pain.  Encouraged to follow BP readings at home.  She will continue with amlodipine  5mg  daily, furosemide  20mg  daily, Imdur  60mg  daily, olmesartan  40mg  daily and metoprolol  XL 100mg  1-1/2 tabs daily.  She is encouraged to follow low sodium diet.    Orders: -     Comprehensive metabolic panel with GFR  Chronic diastolic heart failure (HCC) Assessment & Plan: Chronic, importance of continued dietary/medication compliance was discussed with the patient.    Chronic constipation Assessment & Plan: Chronic, advised pt that constipation can also cause back pain.  - Advised to take Miralax  on a schedule instead of prn. - She will let me know if her sx persist.    Acute right-sided low back pain, unspecified whether sciatica present Assessment & Plan: Acute lower back pain likely due to muscle spasms or spinal arthritis. Differential includes kidney  stones. No recent stones or hematuria. Constipation may contribute. - Prescribed Robaxin  for muscle relaxation. - Encouraged stretching exercises. - Increased fluid intake, including lemonade. - Urinalysis for kidney stones. - Consider MRI if pain persists - Recommended Miralax  for constipation.   Class 2 severe obesity due to excess calories with serious comorbidity and body mass index (BMI) of 37.0 to 37.9 in adult [facility]) Assessment & Plan: She is encouraged to strive for BMI less than 30 to decrease cardiac risk. Advised to aim for at least 150 minutes of exercise per week.    Other orders -     Methocarbamol ; Take 1 tablet (500 mg total) by mouth every 8 (eight) hours as needed for muscle spasms.  Dispense: 30 tablet; Refill: 0 -     Polyethylene Glycol 3350 ; Take 17 g by mouth daily.  Dispense: 30 each; Refill: 0    Return for 3 month dm check.  Patient was given opportunity to ask questions. Patient verbalized understanding of the plan and was able to repeat key elements of the plan. All questions were answered to their satisfaction.    I, Dawn LOISE Slocumb, MD, have reviewed all documentation for this visit. The documentation on 08/24/23 for the exam, diagnosis, procedures, and orders are all accurate and complete.   IF YOU HAVE BEEN REFERRED TO A SPECIALIST, IT MAY TAKE 1-2 WEEKS TO SCHEDULE/PROCESS THE REFERRAL. IF YOU HAVE NOT HEARD FROM US /SPECIALIST IN TWO WEEKS, PLEASE GIVE US  A CALL AT [phone] X [phone].   THE PATIENT IS ENCOURAGED TO PRACTICE SOCIAL DISTANCING DUE TO THE COVID-19 PANDEMIC.

## 2023-08-24 NOTE — Patient Instructions (Signed)

## 2023-08-25 ENCOUNTER — Ambulatory Visit (HOSPITAL_BASED_OUTPATIENT_CLINIC_OR_DEPARTMENT_OTHER): Admitting: Adult Health

## 2023-08-25 LAB — COMPREHENSIVE METABOLIC PANEL WITH GFR
ALT: 11 IU/L (ref 0–32)
AST: 9 IU/L (ref 0–40)
Albumin: 4 g/dL (ref 3.8–4.8)
Alkaline Phosphatase: 115 IU/L (ref 44–121)
BUN/Creatinine Ratio: 15 (ref 12–28)
BUN: 18 mg/dL (ref 8–27)
Bilirubin Total: 0.5 mg/dL (ref 0.0–1.2)
CO2: 22 mmol/L (ref 20–29)
Calcium: 8.9 mg/dL (ref 8.7–10.3)
Chloride: 103 mmol/L (ref 96–106)
Creatinine, Ser: 1.17 mg/dL — ABNORMAL HIGH (ref 0.57–1.00)
Globulin, Total: 3 g/dL (ref 1.5–4.5)
Glucose: 42 mg/dL — ABNORMAL LOW (ref 70–99)
Potassium: 4.1 mmol/L (ref 3.5–5.2)
Sodium: 141 mmol/L (ref 134–144)
Total Protein: 7 g/dL (ref 6.0–8.5)
eGFR: 48 mL/min/{1.73_m2} — ABNORMAL LOW (ref >59)

## 2023-08-25 LAB — HEMOGLOBIN A1C
Est. average glucose Bld gHb Est-mCnc: 134 mg/dL
Hgb A1c MFr Bld: 6.3 % — ABNORMAL HIGH (ref 4.8–5.6)

## 2023-08-26 ENCOUNTER — Encounter (HOSPITAL_COMMUNITY): Admission: RE | Admit: 2023-08-26 | Source: Ambulatory Visit

## 2023-08-29 ENCOUNTER — Encounter (HOSPITAL_COMMUNITY)

## 2023-08-29 ENCOUNTER — Telehealth (HOSPITAL_COMMUNITY): Payer: Self-pay | Admitting: *Deleted

## 2023-08-29 NOTE — Telephone Encounter (Signed)
 Received voice mail message this morning. Will not be attending cardiac rehab today due to continued issues with back pain. Hopes to return Wednesday but if not will call.

## 2023-08-29 NOTE — Progress Notes (Deleted)
  Cardiology Office Note    Patient Name: Dawn Wood Date of Encounter: 08/29/2023  Primary Care Provider:  Jarold Medici, MD Primary Cardiologist:  Wyn Raddle, Jackee Shove, NP Primary Electrophysiologist: None   Past Medical History    Past Medical History:  Diagnosis Date   Asthma    Atrial fibrillation [facility])    CKD (chronic kidney disease)    Coronary artery disease    Diabetes mellitus without complication (HCC)    GERD (gastroesophageal reflux disease)    Heart attack (HCC) 12/2022   Hypertension    MI (myocardial infarction) (HCC)    Peripheral vascular disease (HCC)    Sleep apnea     History of Present Illness  Dawn Wood is a 78 y.o. female with a PMH of***    Patient denies chest pain, palpitations, dyspnea, PND, orthopnea, nausea, vomiting, dizziness, syncope, edema, weight gain, or early satiety.   Discussed the use of AI scribe software for clinical note transcription with the patient, who gave verbal consent to proceed.  History of Present Illness    ***Notes:   Review of Systems  Please see the history of present illness.    All other systems reviewed and are otherwise negative except as noted above.  Physical Exam    Wt Readings from Last 3 Encounters:  08/24/23 220 lb (99.8 kg)  08/09/23 222 lb (100.7 kg)  07/26/23 222 lb 0.1 oz (100.7 kg)   CD:Uyzmz were no vitals filed for this visit.,There is no height or weight on file to calculate BMI. GEN: Well nourished, well developed in no acute distress Neck: No JVD; No carotid bruits Pulmonary: Clear to auscultation without rales, wheezing or rhonchi  Cardiovascular: Normal rate. Regular rhythm. Normal S1. Normal S2.   Murmurs: There is no murmur.  ABDOMEN: Soft, non-tender, non-distended EXTREMITIES:  No edema; No deformity   EKG/LABS/ Recent Cardiac Studies   ECG personally reviewed by me today - ***  Risk Assessment/Calculations:   {Does this patient have ATRIAL  FIBRILLATION?:[phone]}      Lab Results  Component Value Date   WBC 11.9 (H) 06/16/2023   HGB 14.7 06/16/2023   HCT 46.2 (H) 06/16/2023   MCV 86.0 06/16/2023   PLT 252 06/16/2023   Lab Results  Component Value Date   CREATININE 1.17 (H) 08/24/2023   BUN 18 08/24/2023   NA 141 08/24/2023   K 4.1 08/24/2023   CL 103 08/24/2023   CO2 22 08/24/2023   Lab Results  Component Value Date   CHOL 127 04/21/2023   HDL 53 04/21/2023   LDLCALC 57 04/21/2023   LDLDIRECT 83 06/11/2019   TRIG 92 04/21/2023   CHOLHDL 2.4 04/21/2023    Lab Results  Component Value Date   HGBA1C 6.3 (H) 08/24/2023   Assessment & Plan    Assessment and Plan Assessment & Plan     1.***  2.***  3.***  4.***      Disposition: Follow-up with Wyn Raddle, Jackee Shove, NP or APP in *** months {Are you ordering a CV Procedure (e.g. stress test, cath, DCCV, TEE, etc)?   Press F2        :[id number]}   Signed, Wyn Raddle, Jackee Shove, NP 08/29/2023, 8:09 AM [facility]

## 2023-08-30 ENCOUNTER — Ambulatory Visit: Admitting: Nurse Practitioner

## 2023-08-30 ENCOUNTER — Ambulatory Visit: Payer: Medicare PPO | Admitting: Rheumatology

## 2023-08-31 ENCOUNTER — Encounter (HOSPITAL_COMMUNITY)
Admission: RE | Admit: 2023-08-31 | Discharge: 2023-08-31 | Disposition: A | Discharge status: Home / Self Care | Source: Ambulatory Visit | Attending: Internal Medicine | Admitting: Internal Medicine

## 2023-09-02 ENCOUNTER — Encounter (HOSPITAL_COMMUNITY)

## 2023-09-02 ENCOUNTER — Other Ambulatory Visit: Payer: Self-pay | Admitting: Internal Medicine

## 2023-09-02 DIAGNOSIS — K5909 Other constipation: Secondary | ICD-10-CM | POA: Insufficient documentation

## 2023-09-02 DIAGNOSIS — M5416 Radiculopathy, lumbar region: Secondary | ICD-10-CM

## 2023-09-02 NOTE — Assessment & Plan Note (Signed)
 She is encouraged to strive for BMI less than 30 to decrease cardiac risk. Advised to aim for at least 150 minutes of exercise per week.

## 2023-09-02 NOTE — Assessment & Plan Note (Addendum)
 Acute lower back pain likely due to muscle spasms or spinal arthritis. Differential includes kidney stones. No recent stones or hematuria. Constipation may contribute. - Prescribed Robaxin  for muscle relaxation. - Encouraged stretching exercises. - Increased fluid intake, including lemonade. - Urinalysis for kidney stones. - Consider MRI if pain persists - Recommended Miralax  for constipation.

## 2023-09-02 NOTE — Assessment & Plan Note (Addendum)
 Chronic, uncontrolled today. No med changes, she is in obvious pain.  Encouraged to follow BP readings at home.  She will continue with amlodipine  5mg  daily, furosemide  20mg  daily, Imdur  60mg  daily, olmesartan  40mg  daily and metoprolol  XL 100mg  1-1/2 tabs daily.  She is encouraged to follow low sodium diet.

## 2023-09-02 NOTE — Assessment & Plan Note (Signed)
 Chronic, advised pt that constipation can also cause back pain.  - Advised to take Miralax  on a schedule instead of prn. - She will let me know if her sx persist.

## 2023-09-02 NOTE — Assessment & Plan Note (Signed)
 Managed with insulin  and semaglutide . No issues discussed.  Chronic, she is encouraged to stay well hydrated, avoid NSAIDs and keep BP controlled to prevent progression of CKD.  Hypoglycemia during cardiac rehab with glucose at 35 mg/dL. Communication issues with glucose reporting. - Consider reducing diabetes medication dosage to 30 units on days of rehab - Encouraged MyChart use for glucose communication. - Suggested daughter access MyChart.   - Continue current insulin  regimen. - Continue semaglutide  (Ozempic ) 0.5 mg weekly.

## 2023-09-05 ENCOUNTER — Encounter (HOSPITAL_COMMUNITY): Admission: RE | Admit: 2023-09-05 | Source: Ambulatory Visit

## 2023-09-05 DIAGNOSIS — I5032 Chronic diastolic (congestive) heart failure: Secondary | ICD-10-CM | POA: Insufficient documentation

## 2023-09-05 NOTE — Assessment & Plan Note (Signed)
 Chronic, importance of continued dietary/medication compliance was discussed with the patient.

## 2023-09-06 ENCOUNTER — Other Ambulatory Visit: Payer: Self-pay

## 2023-09-06 ENCOUNTER — Telehealth: Payer: Self-pay | Admitting: Internal Medicine

## 2023-09-06 MED ORDER — DILTIAZEM HCL ER COATED BEADS 180 MG PO CP24: 180.0000 mg | ORAL_CAPSULE | Freq: Every day | ORAL | 1 refills | Status: AC

## 2023-09-06 MED ORDER — DILTIAZEM HCL ER COATED BEADS 180 MG PO CP24: 180.0000 mg | ORAL_CAPSULE | Freq: Every day | ORAL | 1 refills | Status: DC

## 2023-09-06 NOTE — Telephone Encounter (Signed)
 Copied from CRM [id number]. Topic: Clinical - Medication Refill >> Sep 06, 2023  8:06 AM [place] MATSU wrote: Medication: diltiazem  (CARDIZEM  CD) 180 MG 24 hr capsule  Has the patient contacted their pharmacy? Yes (Agent: If no, request that the patient contact the pharmacy for the refill. If patient does not wish to contact the pharmacy document the reason why and proceed with request.) (Agent: If yes, when and what did the pharmacy advise?)  This is the patient's preferred pharmacy:  [facility] STORE #[id number] [place] - [address] AT [facility] [address] [place] [address] Phone: [phone] Fax: [phone]    Is this the correct pharmacy for this prescription? Yes If no, delete pharmacy and type the correct one.   Has the prescription been filled recently? Yes  Is the patient out of the medication? No  Has the patient been seen for an appointment in the last year OR does the patient have an upcoming appointment? Yes  Can we respond through MyChart? yes  Agent: Please be advised that Rx refills may take up to 3 business days. We ask that you follow-up with your pharmacy.

## 2023-09-07 ENCOUNTER — Telehealth (HOSPITAL_COMMUNITY): Payer: Self-pay | Admitting: *Deleted

## 2023-09-07 ENCOUNTER — Encounter (HOSPITAL_COMMUNITY): Admission: RE | Admit: 2023-09-07 | Source: Ambulatory Visit

## 2023-09-07 NOTE — Telephone Encounter (Signed)
 Spoke with Dawn Wood. Dawn Wood is continuing to have ppain in her lower back and now the upper area of her buttocks. Dawn Wood has an MRI scheduled for 09/25/23. Dawn Wood will let us  know if she will be able to return to exercise at cardiac rehab after the MRI is completed.Hadassah Elpidio Quan RN BSN

## 2023-09-12 ENCOUNTER — Encounter (HOSPITAL_COMMUNITY)

## 2023-09-14 ENCOUNTER — Encounter (HOSPITAL_COMMUNITY)

## 2023-09-15 NOTE — Addendum Note (Signed)
 Encounter addended by: Janann Lenis on: 09/15/2023 9:24 AM  Actions taken: Flowsheet accepted

## 2023-09-16 ENCOUNTER — Encounter (HOSPITAL_COMMUNITY)

## 2023-09-19 ENCOUNTER — Encounter (HOSPITAL_COMMUNITY)

## 2023-09-20 ENCOUNTER — Encounter (HOSPITAL_COMMUNITY): Payer: Self-pay | Admitting: *Deleted

## 2023-09-20 ENCOUNTER — Ambulatory Visit: Admitting: Podiatry

## 2023-09-20 ENCOUNTER — Telehealth (HOSPITAL_COMMUNITY): Payer: Self-pay | Admitting: *Deleted

## 2023-09-20 DIAGNOSIS — Z955 Presence of coronary angioplasty implant and graft: Secondary | ICD-10-CM

## 2023-09-20 DIAGNOSIS — I213 ST elevation (STEMI) myocardial infarction of unspecified site: Secondary | ICD-10-CM

## 2023-09-20 NOTE — Progress Notes (Signed)
 Cardiac Individual Treatment Plan  Patient Details  Name: Dawn Wood MRN: [id number] Date of Birth: [birth year] Referring Provider:   Flowsheet Row INTENSIVE CARDIAC REHAB ORIENT from 07/26/2023 in [facility]  Referring Provider Wendel Lurena POUR, MD    Initial Encounter Date:  Flowsheet Row INTENSIVE CARDIAC REHAB ORIENT from 07/26/2023 in [facility]  Date 07/26/23    Visit Diagnosis: 11/30/23 STEMI  11/30/23 DES LAD  Patient's Home Medications on Admission:  Current Outpatient Medications:    Accu-Chek Softclix Lancets lancets, USE TO CHECK BLOOD SUGARS ONCE DAILY., Disp: 100 each, Rfl: 12   albuterol  (VENTOLIN  HFA) 108 (90 Base) MCG/ACT inhaler, Inhale 2 puffs into the lungs every 6 (six) hours as needed for wheezing or shortness of breath., Disp: 8 g, Rfl: 2   BD PEN NEEDLE NANO 2ND GEN 32G X 4 MM MISC, USE AS DIRECTED TWICE DAILY, Disp: 100 each, Rfl: 2   Blood Glucose Monitoring Suppl (ACCU-CHEK GUIDE) w/Device KIT, USE TO CHECK BLOOD SUGARS ONCE DAILY., Disp: 1 kit, Rfl: 0   budesonide -formoterol  (SYMBICORT ) 160-4.5 MCG/ACT inhaler, Inhale 2 puffs into the lungs 2 (two) times daily., Disp: 10.2 g, Rfl: 11   Cholecalciferol (VITAMIN D3) 2000 units TABS, Take 1 tablet by mouth daily., Disp: , Rfl:    colchicine  0.6 MG tablet, Take 1 tablet (0.6 mg total) by mouth as needed (for gout)., Disp: 30 tablet, Rfl: 1   Continuous Glucose Sensor (DEXCOM G7 SENSOR) MISC, Use daily to check blood sugars. Change every 10 days., Disp: 4 each, Rfl: 2   diltiazem  (CARDIZEM  CD) 180 MG 24 hr capsule, Take 1 capsule (180 mg total) by mouth daily., Disp: 90 capsule, Rfl: 1   dorzolamide -timolol  (COSOPT ) 2-0.5 % ophthalmic solution, Place 1 drop into the right eye 2 (two) times daily., Disp: , Rfl:    ELIQUIS  5 MG TABS tablet, TAKE 1 TABLET(5 MG) BY MOUTH TWICE DAILY, Disp:  180 tablet, Rfl: 1   Evolocumab  (REPATHA  SURECLICK) 140 MG/ML SOAJ, Inject 140 mg into the skin every 14 (fourteen) days., Disp: 6 mL, Rfl: 3   fluticasone  (FLONASE ) 50 MCG/ACT nasal spray, Place 2 sprays into both nostrils daily., Disp: 16 g, Rfl: 2   furosemide  (LASIX ) 20 MG tablet, TAKE 1 TABLET(20 MG) BY MOUTH DAILY, Disp: 30 tablet, Rfl: 10   glucose blood (ACCU-CHEK GUIDE) test strip, USE TO CHECK BLOOD SUGARS ONCE DAILY., Disp: 100 each, Rfl: 12   insulin  aspart protamine - aspart (NOVOLOG  MIX 70/30 FLEXPEN) (70-30) 100 UNIT/ML FlexPen, INJECT 40 UNITS SUBCUTANEOUS EVERY MORNING AND 50 UNITS SUBCUTANEOUS AT DINNER (Patient taking differently: INJECT 45 UNITS SUBCUTANEOUS EVERY MORNING AND 50 UNITS SUBCUTANEOUS AT DINNER), Disp: 30 mL, Rfl: 2   isosorbide  mononitrate (IMDUR ) 60 MG 24 hr tablet, Take 1 tablet (60 mg total) by mouth daily., Disp: 90 tablet, Rfl: 2   Latanoprostene Bunod  (VYZULTA ) 0.024 % SOLN, Place 1 drop into the right eye at bedtime., Disp: , Rfl:    methocarbamol  (ROBAXIN ) 500 MG tablet, Take 1 tablet (500 mg total) by mouth every 8 (eight) hours as needed for muscle spasms., Disp: 30 tablet, Rfl: 0   metoprolol  succinate (TOPROL -XL) 100 MG 24 hr tablet, Take 1.5 tablets (150 mg total) by mouth daily. Take with or immediately following a meal., Disp: 135 tablet, Rfl: 3   nitroGLYCERIN  (NITROSTAT ) 0.4 MG SL tablet, Place 1 tablet (0.4 mg total)  under the tongue every 5 (five) minutes as needed for chest pain., Disp: 25 tablet, Rfl: 11   olmesartan  (BENICAR ) 40 MG tablet, TAKE 1 TABLET(40 MG) BY MOUTH DAILY, Disp: 90 tablet, Rfl: 2   ONETOUCH VERIO test strip, USE TO CHECK BLOOD SUGARS TWICE DAILY AS DIRECTED, Disp: 100 strip, Rfl: 3   pantoprazole  (PROTONIX ) 40 MG tablet, TAKE 1 TABLET(40 MG) BY MOUTH DAILY, Disp: 90 tablet, Rfl: 1   polyethylene glycol (MIRALAX ) 17 g packet, Take 17 g by mouth daily., Disp: 30 each, Rfl: 0   potassium chloride  (KLOR-CON ) 10 MEQ tablet, Take 1  tablet (10 mEq total) by mouth at bedtime., Disp: , Rfl:    Semaglutide ,0.25 or 0.5MG /DOS, (OZEMPIC , 0.25 OR 0.5 MG/DOSE,) 2 MG/1.5ML SOPN, INJECT 0.5MG  INTO SKIN ONCE A WEEK, Disp: 4.5 mL, Rfl: 3   spironolactone  (ALDACTONE ) 25 MG tablet, TAKE 1 TABLET(25 MG) BY MOUTH DAILY, Disp: 90 tablet, Rfl: 1  Past Medical History: Past Medical History:  Diagnosis Date   Asthma    Atrial fibrillation (HCC)    CKD (chronic kidney disease)    Coronary artery disease    Diabetes mellitus without complication (HCC)    GERD (gastroesophageal reflux disease)    Heart attack (HCC) 12/2022   Hypertension    MI (myocardial infarction) (HCC)    Peripheral vascular disease (HCC)    Sleep apnea     Tobacco Use: Social History   Tobacco Use  Smoking Status Former   Current packs/day: 0.00   Average packs/day: 0.3 packs/day for 45.0 years (11.3 ttl pk-yrs)   Types: Cigarettes   Start date: 01/11/1968   Quit date: 01/10/2013   Years since quitting: 10.6   Passive exposure: Never  Smokeless Tobacco Never  Tobacco Comments   Former smoker 06/24/23    Labs: Review Flowsheet  More data exists      Latest Ref Rng & Units 10/21/2022 11/30/2022 12/01/2022 04/21/2023 08/24/2023  Labs for ITP Cardiac and Pulmonary Rehab  Cholestrol 100 - 199 mg/dL - 822  829  872  -  LDL (calc) 0 - 99 mg/dL - 887  890  57  -  HDL-C >39 mg/dL - 40  36  53  -  Trlycerides 0 - 149 mg/dL - 872  876  92  -  Hemoglobin A1c 4.8 - 5.6 % 6.9  7.1  - 6.1  6.3     Capillary Blood Glucose: Lab Results  Component Value Date   GLUCAP 120 (H) 08/15/2023   GLUCAP 80 08/15/2023   GLUCAP 99 08/12/2023   GLUCAP 137 (H) 08/12/2023   GLUCAP 85 08/05/2023     Exercise Target Goals: Exercise Program Goal: Individual exercise prescription set using results from initial 6 min walk test and THRR while considering  patient's activity barriers and safety.   Exercise Prescription Goal: Initial exercise prescription builds to 30-45  minutes a day of aerobic activity, 2-3 days per week.  Home exercise guidelines will be given to patient during program as part of exercise prescription that the participant will acknowledge.  Activity Barriers & Risk Stratification:  Activity Barriers & Cardiac Risk Stratification - 07/26/23 1306       Activity Barriers & Cardiac Risk Stratification   Activity Barriers History of Falls;Balance Concerns;Shortness of Breath;Other (comment)    Comments PVD, poor circulation in legs. Some back pain with walking.    Cardiac Risk Stratification High          6 Minute Walk:  6 Minute  Walk     Row Name 07/26/23 1404         6 Minute Walk   Phase Initial     Distance 960 feet     Walk Time 6 minutes     # of Rest Breaks 0     MPH 1.82     METS 1.45     RPE 11     Perceived Dyspnea  2     VO2 Peak 5.09     Symptoms Yes (comment)     Comments Mild shortness of breath. Soreness in both arms from pushing rollator, soreness in left knee, hx of knee surgery.     Resting HR 55 bpm     Resting BP 140/64     Resting Oxygen Saturation  95 %     Exercise Oxygen Saturation  during 6 min walk 96 %     Max Ex. HR 85 bpm     Max Ex. BP 178/72     2 Minute Post BP 138/70        Oxygen Initial Assessment:   Oxygen Re-Evaluation:   Oxygen Discharge (Final Oxygen Re-Evaluation):   Initial Exercise Prescription:  Initial Exercise Prescription - 07/26/23 1400       Date of Initial Exercise RX and Referring Provider   Date 07/26/23    Referring Provider Thukkani, Arun K, MD    Expected Discharge Date 10/19/23      NuStep   Level 1    SPM 85    Minutes 15    METs 1.5      Track   Laps 11    Minutes 15    METs 2.4      Prescription Details   Frequency (times per week) 3    Duration Progress to 30 minutes of continuous aerobic without signs/symptoms of physical distress      Intensity   THRR 40-80% of Max Heartrate 57-114    Ratings of Perceived Exertion 11-13     Perceived Dyspnea 0-4      Progression   Progression Continue to progress workloads to maintain intensity without signs/symptoms of physical distress.      Resistance Training   Training Prescription Yes    Weight 2 lbs    Reps 10-15          Perform Capillary Blood Glucose checks as needed.  Exercise Prescription Changes:   Exercise Prescription Changes     Row Name 08/03/23 1400             Response to Exercise   Blood Pressure (Admit) 154/70       Blood Pressure (Exercise) 148/78       Blood Pressure (Exit) 122/74       Heart Rate (Admit) 57 bpm       Heart Rate (Exercise) 83 bpm       Heart Rate (Exit) 60 bpm       Rating of Perceived Exertion (Exercise) 12       Comments Pt first day,       Duration Progress to 30 minutes of  aerobic without signs/symptoms of physical distress       Intensity THRR unchanged         Progression   Progression Continue to progress workloads to maintain intensity without signs/symptoms of physical distress.       Average METs 1.5         Resistance Training   Training Prescription No  Weight No weights on Wed         NuStep   Level 1       SPM 58       Minutes 15       METs 1.5          Exercise Comments:   Exercise Comments     Row Name 08/03/23 1422 08/26/23 1500         Exercise Comments Pt first day in program. CBG check prior to exercise was 100, pt was given snacks and rechecked prior to exercise. Recheck after 15 minutes was 109, pt exercised on the NuStep only today. Pt tolerated her ExRX well. Pt due for review of METs and goals but did not attend will complete upon return.         Exercise Goals and Review:   Exercise Goals     Row Name 07/26/23 1306             Exercise Goals   Increase Physical Activity Yes       Intervention Provide advice, education, support and counseling about physical activity/exercise needs.;Develop an individualized exercise prescription for aerobic and resistive  training based on initial evaluation findings, risk stratification, comorbidities and participant's personal goals.       Expected Outcomes Short Term: Attend rehab on a regular basis to increase amount of physical activity.;Long Term: Exercising regularly at least 3-5 days a week.;Long Term: Add in home exercise to make exercise part of routine and to increase amount of physical activity.       Increase Strength and Stamina Yes       Intervention Provide advice, education, support and counseling about physical activity/exercise needs.;Develop an individualized exercise prescription for aerobic and resistive training based on initial evaluation findings, risk stratification, comorbidities and participant's personal goals.       Expected Outcomes Short Term: Increase workloads from initial exercise prescription for resistance, speed, and METs.;Short Term: Perform resistance training exercises routinely during rehab and add in resistance training at home;Long Term: Improve cardiorespiratory fitness, muscular endurance and strength as measured by increased METs and functional capacity ( )       Able to understand and use rate of perceived exertion (RPE) scale Yes       Intervention Provide education and explanation on how to use RPE scale       Expected Outcomes Short Term: Able to use RPE daily in rehab to express subjective intensity level;Long Term:  Able to use RPE to guide intensity level when exercising independently       Knowledge and understanding of Target Heart Rate Range (THRR) Yes       Intervention Provide education and explanation of THRR including how the numbers were predicted and where they are located for reference       Expected Outcomes Short Term: Able to state/look up THRR;Long Term: Able to use THRR to govern intensity when exercising independently;Short Term: Able to use daily as guideline for intensity in rehab       Able to check pulse independently Yes       Intervention  Provide education and demonstration on how to check pulse in carotid and radial arteries.;Review the importance of being able to check your own pulse for safety during independent exercise       Expected Outcomes Short Term: Able to explain why pulse checking is important during independent exercise;Long Term: Able to check pulse independently and accurately       Understanding  of Exercise Prescription Yes       Intervention Provide education, explanation, and written materials on patient's individual exercise prescription       Expected Outcomes Short Term: Able to explain program exercise prescription;Long Term: Able to explain home exercise prescription to exercise independently          Exercise Goals Re-Evaluation :   Discharge Exercise Prescription (Final Exercise Prescription Changes):  Exercise Prescription Changes - 08/03/23 1400       Response to Exercise   Blood Pressure (Admit) 154/70    Blood Pressure (Exercise) 148/78    Blood Pressure (Exit) 122/74    Heart Rate (Admit) 57 bpm    Heart Rate (Exercise) 83 bpm    Heart Rate (Exit) 60 bpm    Rating of Perceived Exertion (Exercise) 12    Comments Pt first day,    Duration Progress to 30 minutes of  aerobic without signs/symptoms of physical distress    Intensity THRR unchanged      Progression   Progression Continue to progress workloads to maintain intensity without signs/symptoms of physical distress.    Average METs 1.5      Resistance Training   Training Prescription No    Weight No weights on Wed      NuStep   Level 1    SPM 58    Minutes 15    METs 1.5          Nutrition:  Target Goals: Understanding of nutrition guidelines, daily intake of sodium 1500mg , cholesterol 200mg , calories 30% from fat and 7% or less from saturated fats, daily to have 5 or more servings of fruits and vegetables.  Biometrics:  Pre Biometrics - 07/26/23 1259       Pre Biometrics   Waist Circumference 47 inches    Hip  Circumference 51.5 inches    Waist to Hip Ratio 0.91 %    Triceps Skinfold 55 mm    % Body Fat 52.7 %    Grip Strength 20 kg    Flexibility 12.5 in    Single Leg Stand 2.93 seconds           Nutrition Therapy Plan and Nutrition Goals:  Nutrition Therapy & Goals - 09/02/23 1127       Nutrition Therapy   Diet Heart Healthy Diet      Personal Nutrition Goals   Nutrition Goal Patient to identify strategies for reducing cardiovascular risk by attending the [employer] education and nutrition series weekly.   goal not met.   Personal Goal #2 Patient to improve diet quality by using the plate method as a guide for meal planning to include lean protein/plant protein, fruits, vegetables, whole grains, nonfat dairy as part of a well-balanced diet.   goal in progress.   Comments Patient has medical history of history of CAD s/p NSTEMI treated with PCI/DES to LAD with 60% RCA and 40% LCx, HTN, HLD, PVD, DM type II, PAF (on Eliquis ), arthritis. Overall attendance has been variable; patient has not attended rehab since 08/15/23. She does not attend the [employer] education and nutrition series regularly; she has attended 3 education sessions. A1c is well controlled in a pre-diabetic range (insulin , ozempic ). LDL is at goal. Patient will benefit from participation in intensive cardiac rehab for nutrition, exercise, and lifestyle modification.      Intervention Plan   Intervention Prescribe, educate and counsel regarding individualized specific dietary modifications aiming towards targeted core components such as weight, hypertension, lipid management, diabetes, heart  failure and other comorbidities.;Nutrition handout(s) given to patient.    Expected Outcomes Short Term Goal: Understand basic principles of dietary content, such as calories, fat, sodium, cholesterol and nutrients.;Long Term Goal: Adherence to prescribed nutrition plan.          Nutrition Assessments:  Nutrition Assessments - 08/04/23 1511        Rate Your Plate Scores   Pre Score 54         MEDIFICTS Score Key: >=70 Need to make dietary changes  40-70 Heart Healthy Diet <= 40 Therapeutic Level Cholesterol Diet   Flowsheet Row INTENSIVE CARDIAC REHAB from 08/03/2023 in [facility]  Picture Your Plate Total Score on Admission 54   Picture Your Plate Scores: <59 Unhealthy dietary pattern with much room for improvement. 41-50 Dietary pattern unlikely to meet recommendations for good health and room for improvement. 51-60 More healthful dietary pattern, with some room for improvement.  >60 Healthy dietary pattern, although there may be some specific behaviors that could be improved.    Nutrition Goals Re-Evaluation:  Nutrition Goals Re-Evaluation     Row Name 08/04/23 1118 09/02/23 1127           Goals   Current Weight 220 lb 14.4 oz (100.2 kg) 220 lb 10.9 oz (100.1 kg)      Comment A1c 6.1 (insulin , ozempic ), LDL 57 (repatha ) , HDL 53 A1c 6.3 (insulin , ozempic ), LDL 57 (repatha ) , HDL 53      Expected Outcome Patient has medical history of history of CAD s/p NSTEMI treated with PCI/DES to LAD with 60% RCA and 40% LCx, HTN, HLD, PVD, DM type II, PAF (on Eliquis ), arthritis. A1c is well controlled in a pre-diabetic range. LDL is at goal. Patient will benefit from participation in intensive cardiac rehab for nutrition, exercise, and lifestyle modification. Patient has medical history of history of CAD s/p NSTEMI treated with PCI/DES to LAD with 60% RCA and 40% LCx, HTN, HLD, PVD, DM type II, PAF (on Eliquis ), arthritis. Overall attendance has been variable; patient has not attended rehab since 08/15/23. She does not attend the [employer] education and nutrition series regularly; she has attended 3 education sessions. A1c is well controlled in a pre-diabetic range (insulin , ozempic ). LDL is at goal. Patient will benefit from participation in intensive cardiac rehab for  nutrition, exercise, and lifestyle modification.         Nutrition Goals Re-Evaluation:  Nutrition Goals Re-Evaluation     Row Name 08/04/23 1118 09/02/23 1127           Goals   Current Weight 220 lb 14.4 oz (100.2 kg) 220 lb 10.9 oz (100.1 kg)      Comment A1c 6.1 (insulin , ozempic ), LDL 57 (repatha ) , HDL 53 A1c 6.3 (insulin , ozempic ), LDL 57 (repatha ) , HDL 53      Expected Outcome Patient has medical history of history of CAD s/p NSTEMI treated with PCI/DES to LAD with 60% RCA and 40% LCx, HTN, HLD, PVD, DM type II, PAF (on Eliquis ), arthritis. A1c is well controlled in a pre-diabetic range. LDL is at goal. Patient will benefit from participation in intensive cardiac rehab for nutrition, exercise, and lifestyle modification. Patient has medical history of history of CAD s/p NSTEMI treated with PCI/DES to LAD with 60% RCA and 40% LCx, HTN, HLD, PVD, DM type II, PAF (on Eliquis ), arthritis. Overall attendance has been variable; patient has not attended rehab since 08/15/23. She does not  attend the [employer] education and nutrition series regularly; she has attended 3 education sessions. A1c is well controlled in a pre-diabetic range (insulin , ozempic ). LDL is at goal. Patient will benefit from participation in intensive cardiac rehab for nutrition, exercise, and lifestyle modification.         Nutrition Goals Discharge (Final Nutrition Goals Re-Evaluation):  Nutrition Goals Re-Evaluation - 09/02/23 1127       Goals   Current Weight 220 lb 10.9 oz (100.1 kg)    Comment A1c 6.3 (insulin , ozempic ), LDL 57 (repatha ) , HDL 53    Expected Outcome Patient has medical history of history of CAD s/p NSTEMI treated with PCI/DES to LAD with 60% RCA and 40% LCx, HTN, HLD, PVD, DM type II, PAF (on Eliquis ), arthritis. Overall attendance has been variable; patient has not attended rehab since 08/15/23. She does not attend the [employer] education and nutrition series regularly; she has attended 3 education  sessions. A1c is well controlled in a pre-diabetic range (insulin , ozempic ). LDL is at goal. Patient will benefit from participation in intensive cardiac rehab for nutrition, exercise, and lifestyle modification.          Psychosocial: Target Goals: Acknowledge presence or absence of significant depression and/or stress, maximize coping skills, provide positive support system. Participant is able to verbalize types and ability to use techniques and skills needed for reducing stress and depression.  Initial Review & Psychosocial Screening:  Initial Psych Review & Screening - 07/26/23 1336       Initial Review   Current issues with Current Depression;Current Stress Concerns    Source of Stress Concerns Unable to participate in former interests or hobbies;Retirement/disability;Chronic Illness    Comments Dawn Wood has stress/ depression because of health issues since retirement. Not able to do everything she would like to do.      Family Dynamics   Good Support System? Yes    Comments Support from her 2 daughters. Talks to son-law who's a Education officer, environmental. Talks to PCP.      Barriers   Psychosocial barriers to participate in program The patient should benefit from training in stress management and relaxation.      Screening Interventions   Interventions Encouraged to exercise;Provide feedback about the scores to participant    Expected Outcomes Short Term goal: Utilizing psychosocial counselor, staff and physician to assist with identification of specific Stressors or current issues interfering with healing process. Setting desired goal for each stressor or current issue identified.;Long Term Goal: Stressors or current issues are controlled or eliminated.;Short Term goal: Identification and review with participant of any Quality of Life or Depression concerns found by scoring the questionnaire.;Long Term goal: The participant improves quality of Life and PHQ9 Scores as seen by post scores and/or  verbalization of changes          Quality of Life Scores:  Quality of Life - 07/27/23 0836       Quality of Life   Select Quality of Life      Quality of Life Scores   Health/Function Pre 19.54 %    Socioeconomic Pre 26 %    Psych/Spiritual Pre 20.57 %    Family Pre 26 %    GLOBAL Pre 21.93 %         Scores of 19 and below usually indicate a poorer quality of life in these areas.  A difference of  2-3 points is a clinically meaningful difference.  A difference of 2-3 points in the total score of the Quality  of Life Index has been associated with significant improvement in overall quality of life, self-image, physical symptoms, and general health in studies assessing change in quality of life.  PHQ-9: Review Flowsheet  More data exists      08/24/2023 07/26/2023 04/21/2023 10/21/2022 05/04/2022  Depression screen PHQ 2/9  Decreased Interest 0 2 0 0 0  Down, Depressed, Hopeless 0 3 0 3 0  PHQ - 2 Score 0 5 0 3 0  Altered sleeping 0 0 0 3 -  Tired, decreased energy 0 3 0 3 -  Change in appetite 0 0 0 0 -  Feeling bad or failure about yourself  0 1 0 0 -  Trouble concentrating 0 0 0 0 -  Moving slowly or fidgety/restless 0 2 0 0 -  Suicidal thoughts 0 0 0 0 -  PHQ-9 Score 0 11 0 9 -  Difficult doing work/chores Not difficult at all Somewhat difficult Not difficult at all Somewhat difficult -   Interpretation of Total Score  Total Score Depression Severity:  1-4 = Minimal depression, 5-9 = Mild depression, 10-14 = Moderate depression, 15-19 = Moderately severe depression, 20-27 = Severe depression   Psychosocial Evaluation and Intervention:   Psychosocial Re-Evaluation:  Psychosocial Re-Evaluation     Row Name 08/11/23 1608 08/23/23 1803 09/20/23 1459         Psychosocial Re-Evaluation   Current issues with Current Stress Concerns;Current Depression Current Stress Concerns;Current Depression Current Stress Concerns;Current Depression     Comments Reviewed [id number]  says that she feels like her health has decline ever since she retired. Dawn Wood says she is depressed. Dawn Wood says she has her son in law and daughter are there if she needs to talk with someone. Dawn Wood is not taking an antidepressant at this time. Dawn Wood will let us  know if she needs changes her mind and needs assistance with obtaining a counseling referral. Dawn Wood has not voiced any increased concerns or stressors during exercise. last day of attendance was on 08/15/23 last day of attendance was on 08/15/23. Exercise is on hold     Expected Outcomes Dawn Wood will have controlled or decreased depression/ stressors upon completion of cardiac rehab Dawn Wood will have controlled or decreased depression/ stressors upon completion of cardiac rehab Dawn Wood will have controlled or decreased depression/ stressors upon completion of cardiac rehab     Interventions Stress management education;Encouraged to attend Cardiac Rehabilitation for the exercise;Relaxation education Stress management education;Encouraged to attend Cardiac Rehabilitation for the exercise;Relaxation education Stress management education;Encouraged to attend Cardiac Rehabilitation for the exercise;Relaxation education     Continue Psychosocial Services  Follow up required by staff Follow up required by staff Follow up required by staff       Initial Review   Source of Stress Concerns Chronic Illness;Unable to participate in former interests or hobbies;Unable to perform yard/household activities Chronic Illness;Unable to participate in former interests or hobbies;Unable to perform yard/household activities Chronic Illness;Unable to participate in former interests or hobbies;Unable to perform yard/household activities     Comments Will continue to monitor and offer support as needed Will continue to monitor and offer support as needed Will continue to monitor and offer support as needed        Psychosocial Discharge (Final Psychosocial  Re-Evaluation):  Psychosocial Re-Evaluation - 09/20/23 1459       Psychosocial Re-Evaluation   Current issues with Current Stress Concerns;Current Depression    Comments last day of attendance was on 08/15/23. Exercise is on hold  Expected Outcomes Dawn Wood will have controlled or decreased depression/ stressors upon completion of cardiac rehab    Interventions Stress management education;Encouraged to attend Cardiac Rehabilitation for the exercise;Relaxation education    Continue Psychosocial Services  Follow up required by staff      Initial Review   Source of Stress Concerns Chronic Illness;Unable to participate in former interests or hobbies;Unable to perform yard/household activities    Comments Will continue to monitor and offer support as needed          Vocational Rehabilitation: Provide vocational rehab assistance to qualifying candidates.   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          Education: Education Goals: Education classes will be provided on a weekly basis, covering required topics. Participant will state understanding/return demonstration of topics presented.    Education     Row Name 08/03/23 1600     Education   Cardiac Education Topics Pritikin   Orthoptist   Educator Dietitian   Weekly Topic [employer] Desserts   Instruction Review Code 1- Verbalizes Understanding   Class Start Time 1400   Class Stop Time 1437   Class Time Calculation (min) 37 min    Row Name 08/10/23 1200     Education   Cardiac Education Topics Pritikin   Customer service manager   Weekly Topic Efficiency Cooking - Meals in a Snap    Instruction Review Code 1- Verbalizes Understanding   Class Start Time 1400   Class Stop Time 1442   Class Time Calculation (min) 42 min    Row Name 08/12/23 1400     Education   Cardiac Education Topics Pritikin   Psychologist, forensic Exercise Education   Exercise Education Move It!   Instruction Review Code 1- Verbalizes Understanding   Class Start Time 1402   Class Stop Time 1437   Class Time Calculation (min) 35 min      Core Videos: Exercise    Move It!  Clinical staff conducted group or individual video education with verbal and written material and guidebook.  Patient learns the recommended Pritikin exercise program. Exercise with the goal of living a long, healthy life. Some of the health benefits of exercise include controlled diabetes, healthier blood pressure levels, improved cholesterol levels, improved heart and lung capacity, improved sleep, and better body composition. Everyone should speak with their doctor before starting or changing an exercise routine.  Biomechanical Limitations Clinical staff conducted group or individual video education with verbal and written material and guidebook.  Patient learns how biomechanical limitations can impact exercise and how we can mitigate and possibly overcome limitations to have an impactful and balanced exercise routine.  Body Composition Clinical staff conducted group or individual video education with verbal and written material and guidebook.  Patient learns that body composition (ratio of muscle mass to fat mass) is a key component to assessing overall fitness, rather than body weight alone. Increased fat mass, especially visceral belly fat, can put us  at increased risk for metabolic syndrome, type 2  diabetes, heart disease, and even death. It is recommended to combine diet and exercise (cardiovascular and resistance training) to improve your body composition. Seek  guidance from your physician and exercise physiologist before implementing an exercise routine.  Exercise Action Plan Clinical staff conducted group or individual video education with verbal and written material and guidebook.  Patient learns the recommended strategies to achieve and enjoy long-term exercise adherence, including variety, self-motivation, self-efficacy, and positive decision making. Benefits of exercise include fitness, good health, weight management, more energy, better sleep, less stress, and overall well-being.  Medical   Heart Disease Risk Reduction Clinical staff conducted group or individual video education with verbal and written material and guidebook.  Patient learns our heart is our most vital organ as it circulates oxygen, nutrients, white blood cells, and hormones throughout the entire body, and carries waste away. Data supports a plant-based eating plan like the Pritikin Program for its effectiveness in slowing progression of and reversing heart disease. The video provides a number of recommendations to address heart disease.   Metabolic Syndrome and Belly Fat  Clinical staff conducted group or individual video education with verbal and written material and guidebook.  Patient learns what metabolic syndrome is, how it leads to heart disease, and how one can reverse it and keep it from coming back. You have metabolic syndrome if you have 3 of the following 5 criteria: abdominal obesity, high blood pressure, high triglycerides, low HDL cholesterol, and high blood sugar.  Hypertension and Heart Disease Clinical staff conducted group or individual video education with verbal and written material and guidebook.  Patient learns that high blood pressure, or hypertension, is very common in the [place] . Hypertension is largely due to excessive salt intake, but other important risk factors include being overweight, physical inactivity, drinking too much alcohol, smoking, and  not eating enough potassium from fruits and vegetables. High blood pressure is a leading risk factor for heart attack, stroke, congestive heart failure, dementia, kidney failure, and premature death. Long-term effects of excessive salt intake include stiffening of the arteries and thickening of heart muscle and organ damage. Recommendations include ways to reduce hypertension and the risk of heart disease.  Diseases of Our Time - Focusing on Diabetes Clinical staff conducted group or individual video education with verbal and written material and guidebook.  Patient learns why the best way to stop diseases of our time is prevention, through food and other lifestyle changes. Medicine (such as prescription pills and surgeries) is often only a Band-Aid on the problem, not a long-term solution. Most common diseases of our time include obesity, type 2 diabetes, hypertension, heart disease, and cancer. The Pritikin Program is recommended and has been proven to help reduce, reverse, and/or prevent the damaging effects of metabolic syndrome.  Nutrition   Overview of the Pritikin Eating Plan  Clinical staff conducted group or individual video education with verbal and written material and guidebook.  Patient learns about the Pritikin Eating Plan for disease risk reduction. The Pritikin Eating Plan emphasizes a wide variety of unrefined, minimally-processed carbohydrates, like fruits, vegetables, whole grains, and legumes. Go, Caution, and Stop food choices are explained. Plant-based and lean animal proteins are emphasized. Rationale provided for low sodium intake for blood pressure control, low added sugars for blood sugar stabilization, and low added fats and oils for coronary artery disease risk reduction and weight management.  Calorie Density  Clinical staff conducted group or individual video education with verbal and written material and guidebook.  Patient  learns about calorie density and how it impacts  the Pritikin Eating Plan. Knowing the characteristics of the food you choose will help you decide whether those foods will lead to weight gain or weight loss, and whether you want to consume more or less of them. Weight loss is usually a side effect of the Pritikin Eating Plan because of its focus on low calorie-dense foods.  Label Reading  Clinical staff conducted group or individual video education with verbal and written material and guidebook.  Patient learns about the Pritikin recommended label reading guidelines and corresponding recommendations regarding calorie density, added sugars, sodium content, and whole grains.  Dining Out - Part 1  Clinical staff conducted group or individual video education with verbal and written material and guidebook.  Patient learns that restaurant meals can be sabotaging because they can be so high in calories, fat, sodium, and/or sugar. Patient learns recommended strategies on how to positively address this and avoid unhealthy pitfalls.  Facts on Fats  Clinical staff conducted group or individual video education with verbal and written material and guidebook.  Patient learns that lifestyle modifications can be just as effective, if not more so, as many medications for lowering your risk of heart disease. A Pritikin lifestyle can help to reduce your risk of inflammation and atherosclerosis (cholesterol build-up, or plaque, in the artery walls). Lifestyle interventions such as dietary choices and physical activity address the cause of atherosclerosis. A review of the types of fats and their impact on blood cholesterol levels, along with dietary recommendations to reduce fat intake is also included.  Nutrition Action Plan  Clinical staff conducted group or individual video education with verbal and written material and guidebook.  Patient learns how to incorporate Pritikin recommendations into their lifestyle. Recommendations include planning and keeping personal  health goals in mind as an important part of their success.  Healthy Mind-Set    Healthy Minds, Bodies, Hearts  Clinical staff conducted group or individual video education with verbal and written material and guidebook.  Patient learns how to identify when they are stressed. Video will discuss the impact of that stress, as well as the many benefits of stress management. Patient will also be introduced to stress management techniques. The way we think, act, and feel has an impact on our hearts.  How Our Thoughts Can Heal Our Hearts  Clinical staff conducted group or individual video education with verbal and written material and guidebook.  Patient learns that negative thoughts can cause depression and anxiety. This can result in negative lifestyle behavior and serious health problems. Cognitive behavioral therapy is an effective method to help control our thoughts in order to change and improve our emotional outlook.  Additional Videos:  Exercise    Improving Performance  Clinical staff conducted group or individual video education with verbal and written material and guidebook.  Patient learns to use a non-linear approach by alternating intensity levels and lengths of time spent exercising to help burn more calories and lose more body fat. Cardiovascular exercise helps improve heart health, metabolism, hormonal balance, blood sugar control, and recovery from fatigue. Resistance training improves strength, endurance, balance, coordination, reaction time, metabolism, and muscle mass. Flexibility exercise improves circulation, posture, and balance. Seek guidance from your physician and exercise physiologist before implementing an exercise routine and learn your capabilities and proper form for all exercise.  Introduction to Yoga  Clinical staff conducted group or individual video education with verbal and written material and guidebook.  Patient learns about yoga,  a discipline of the coming  together of mind, breath, and body. The benefits of yoga include improved flexibility, improved range of motion, better posture and core strength, increased lung function, weight loss, and positive self-image. Yoga's heart health benefits include lowered blood pressure, healthier heart rate, decreased cholesterol and triglyceride levels, improved immune function, and reduced stress. Seek guidance from your physician and exercise physiologist before implementing an exercise routine and learn your capabilities and proper form for all exercise.  Medical   Aging: Enhancing Your Quality of Life  Clinical staff conducted group or individual video education with verbal and written material and guidebook.  Patient learns key strategies and recommendations to stay in good physical health and enhance quality of life, such as prevention strategies, having an advocate, securing a Health Care Proxy and Power of Attorney, and keeping a list of medications and system for tracking them. It also discusses how to avoid risk for bone loss.  Biology of Weight Control  Clinical staff conducted group or individual video education with verbal and written material and guidebook.  Patient learns that weight gain occurs because we consume more calories than we burn (eating more, moving less). Even if your body weight is normal, you may have higher ratios of fat compared to muscle mass. Too much body fat puts you at increased risk for cardiovascular disease, heart attack, stroke, type 2 diabetes, and obesity-related cancers. In addition to exercise, following the Pritikin Eating Plan can help reduce your risk.  Decoding Lab Results  Clinical staff conducted group or individual video education with verbal and written material and guidebook.  Patient learns that lab test reflects one measurement whose values change over time and are influenced by many factors, including medication, stress, sleep, exercise, food, hydration,  pre-existing medical conditions, and more. It is recommended to use the knowledge from this video to become more involved with your lab results and evaluate your numbers to speak with your doctor.   Diseases of Our Time - Overview  Clinical staff conducted group or individual video education with verbal and written material and guidebook.  Patient learns that according to the CDC, 50% to 70% of chronic diseases (such as obesity, type 2 diabetes, elevated lipids, hypertension, and heart disease) are avoidable through lifestyle improvements including healthier food choices, listening to satiety cues, and increased physical activity.  Sleep Disorders Clinical staff conducted group or individual video education with verbal and written material and guidebook.  Patient learns how good quality and duration of sleep are important to overall health and well-being. Patient also learns about sleep disorders and how they impact health along with recommendations to address them, including discussing with a physician.  Nutrition  Dining Out - Part 2 Clinical staff conducted group or individual video education with verbal and written material and guidebook.  Patient learns how to plan ahead and communicate in order to maximize their dining experience in a healthy and nutritious manner. Included are recommended food choices based on the type of restaurant the patient is visiting.   Fueling a Banker conducted group or individual video education with verbal and written material and guidebook.  There is a strong connection between our food choices and our health. Diseases like obesity and type 2 diabetes are very prevalent and are in large-part due to lifestyle choices. The Pritikin Eating Plan provides plenty of food and hunger-curbing satisfaction. It is easy to follow, affordable, and helps reduce health risks.  Menu Workshop  Clinical staff  conducted group or individual video education  with verbal and written material and guidebook.  Patient learns that restaurant meals can sabotage health goals because they are often packed with calories, fat, sodium, and sugar. Recommendations include strategies to plan ahead and to communicate with the manager, chef, or server to help order a healthier meal.  Planning Your Eating Strategy  Clinical staff conducted group or individual video education with verbal and written material and guidebook.  Patient learns about the Pritikin Eating Plan and its benefit of reducing the risk of disease. The Pritikin Eating Plan does not focus on calories. Instead, it emphasizes high-quality, nutrient-rich foods. By knowing the characteristics of the foods, we choose, we can determine their calorie density and make informed decisions.  Targeting Your Nutrition Priorities  Clinical staff conducted group or individual video education with verbal and written material and guidebook.  Patient learns that lifestyle habits have a tremendous impact on disease risk and progression. This video provides eating and physical activity recommendations based on your personal health goals, such as reducing LDL cholesterol, losing weight, preventing or controlling type 2 diabetes, and reducing high blood pressure.  Vitamins and Minerals  Clinical staff conducted group or individual video education with verbal and written material and guidebook.  Patient learns different ways to obtain key vitamins and minerals, including through a recommended healthy diet. It is important to discuss all supplements you take with your doctor.   Healthy Mind-Set    Smoking Cessation  Clinical staff conducted group or individual video education with verbal and written material and guidebook.  Patient learns that cigarette smoking and tobacco addiction pose a serious health risk which affects millions of people. Stopping smoking will significantly reduce the risk of heart disease, lung disease,  and many forms of cancer. Recommended strategies for quitting are covered, including working with your doctor to develop a successful plan.  Culinary   Becoming a Set designer conducted group or individual video education with verbal and written material and guidebook.  Patient learns that cooking at home can be healthy, cost-effective, quick, and puts them in control. Keys to cooking healthy recipes will include looking at your recipe, assessing your equipment needs, planning ahead, making it simple, choosing cost-effective seasonal ingredients, and limiting the use of added fats, salts, and sugars.  Cooking - Breakfast and Snacks  Clinical staff conducted group or individual video education with verbal and written material and guidebook.  Patient learns how important breakfast is to satiety and nutrition through the entire day. Recommendations include key foods to eat during breakfast to help stabilize blood sugar levels and to prevent overeating at meals later in the day. Planning ahead is also a key component.  Cooking - Educational psychologist conducted group or individual video education with verbal and written material and guidebook.  Patient learns eating strategies to improve overall health, including an approach to cook more at home. Recommendations include thinking of animal protein as a side on your plate rather than center stage and focusing instead on lower calorie dense options like vegetables, fruits, whole grains, and plant-based proteins, such as beans. Making sauces in large quantities to freeze for later and leaving the skin on your vegetables are also recommended to maximize your experience.  Cooking - Healthy Salads and Dressing Clinical staff conducted group or individual video education with verbal and written material and guidebook.  Patient learns that vegetables, fruits, whole grains, and legumes are the foundations of the  Pritikin Eating Plan.  Recommendations include how to incorporate each of these in flavorful and healthy salads, and how to create homemade salad dressings. Proper handling of ingredients is also covered. Cooking - Soups and [employer] - Soups and Desserts Clinical staff conducted group or individual video education with verbal and written material and guidebook.  Patient learns that Pritikin soups and desserts make for easy, nutritious, and delicious snacks and meal components that are low in sodium, fat, sugar, and calorie density, while high in vitamins, minerals, and filling fiber. Recommendations include simple and healthy ideas for soups and desserts.   Overview     The Pritikin Solution Program Overview Clinical staff conducted group or individual video education with verbal and written material and guidebook.  Patient learns that the results of the Pritikin Program have been documented in more than 100 articles published in peer-reviewed journals, and the benefits include reducing risk factors for (and, in some cases, even reversing) high cholesterol, high blood pressure, type 2 diabetes, obesity, and more! An overview of the three key pillars of the Pritikin Program will be covered: eating well, doing regular exercise, and having a healthy mind-set.  WORKSHOPS  Exercise: Exercise Basics: Building Your Action Plan Clinical staff led group instruction and group discussion with PowerPoint presentation and patient guidebook. To enhance the learning environment the use of posters, models and videos may be added. At the conclusion of this workshop, patients will comprehend the difference between physical activity and exercise, as well as the benefits of incorporating both, into their routine. Patients will understand the FITT (Frequency, Intensity, Time, and Type) principle and how to use it to build an exercise action plan. In addition, safety concerns and other considerations for exercise and cardiac rehab  will be addressed by the presenter. The purpose of this lesson is to promote a comprehensive and effective weekly exercise routine in order to improve patients' overall level of fitness.   Managing Heart Disease: Your Path to a Healthier Heart Clinical staff led group instruction and group discussion with PowerPoint presentation and patient guidebook. To enhance the learning environment the use of posters, models and videos may be added.At the conclusion of this workshop, patients will understand the anatomy and physiology of the heart. Additionally, they will understand how Pritikin's three pillars impact the risk factors, the progression, and the management of heart disease.  The purpose of this lesson is to provide a high-level overview of the heart, heart disease, and how the Pritikin lifestyle positively impacts risk factors.  Exercise Biomechanics Clinical staff led group instruction and group discussion with PowerPoint presentation and patient guidebook. To enhance the learning environment the use of posters, models and videos may be added. Patients will learn how the structural parts of their bodies function and how these functions impact their daily activities, movement, and exercise. Patients will learn how to promote a neutral spine, learn how to manage pain, and identify ways to improve their physical movement in order to promote healthy living. The purpose of this lesson is to expose patients to common physical limitations that impact physical activity. Participants will learn practical ways to adapt and manage aches and pains, and to minimize their effect on regular exercise. Patients will learn how to maintain good posture while sitting, walking, and lifting.  Balance Training and Fall Prevention  Clinical staff led group instruction and group discussion with PowerPoint presentation and patient guidebook. To enhance the learning environment the use of posters, models and videos  may  be added. At the conclusion of this workshop, patients will understand the importance of their sensorimotor skills (vision, proprioception, and the vestibular system) in maintaining their ability to balance as they age. Patients will apply a variety of balancing exercises that are appropriate for their current level of function. Patients will understand the common causes for poor balance, possible solutions to these problems, and ways to modify their physical environment in order to minimize their fall risk. The purpose of this lesson is to teach patients about the importance of maintaining balance as they age and ways to minimize their risk of falling.  WORKSHOPS   Nutrition:  Fueling a Ship broker led group instruction and group discussion with PowerPoint presentation and patient guidebook. To enhance the learning environment the use of posters, models and videos may be added. Patients will review the foundational principles of the [facility] Eating Plan and understand what constitutes a serving size in each of the food groups. Patients will also learn [facility]-friendly foods that are better choices when away from home and review make-ahead meal and snack options. Calorie density will be reviewed and applied to three nutrition priorities: weight maintenance, weight loss, and weight gain. The purpose of this lesson is to reinforce (in a group setting) the key concepts around what patients are recommended to eat and how to apply these guidelines when away from home by planning and selecting Prit[facility]in-friendly options. Patients will understand how calorie density may be adjusted for different weight management goals.  Mindful Eating  Clinical staff led group instruction and group discussion with PowerPoint presentation and patient guidebook. To enhance the learning environment the use of posters, models and videos may be added. Patients will briefly review the concepts of the [facility]  Eating Plan and the importance of low-calorie dense foods. The concept of mindful eating will be introduced as well as the importance of paying attention to internal hunger signals. Triggers for non-hunger eating and techniques for dealing with triggers will be explored. The purpose of this lesson is to provide patients with the opportunity to review the basic principles of the [facility] Eating Plan, discuss the value of eating mindfully and how to measure internal cues of hunger and fullness using the Hunger Scale. Patients will also discuss reasons for non-hunger eating and learn strategies to use for controlling emotional eating.  Targeting Your Nutrition Priorities Clinical staff led group instruction and group discussion with PowerPoint presentation and patient guidebook. To enhance the learning environment the use of posters, models and videos may be added. Patients will learn how to determine their genetic susceptibility to disease by reviewing their family history. Patients will gain insight into the importance of diet as part of an overall healthy lifestyle in mitigating the impact of genetics and other environmental insults. The purpose of this lesson is to provide patients with the opportunity to assess their personal nutrition priorities by looking at their family history, their own health history and current risk factors. Patients will also be able to discuss ways of prioritizing and modifying the [facility] Eating Plan for their highest risk areas  Menu  Clinical staff led group instruction and group discussion with PowerPoint presentation and patient guidebook. To enhance the learning environment the use of posters, models and videos may be added. Using menus brought in from [employer], or printed from [employer], patients will apply the [facility] dining out guidelines that were presented in the [employer] video. Patients will also be able to practice these  guidelines  in a variety of provided scenarios. The purpose of this lesson is to provide patients with the opportunity to practice hands-on learning of the [facility] Dining Out guidelines with actual menus and practice scenarios.  Label Reading Clinical staff led group instruction and group discussion with PowerPoint presentation and patient guidebook. To enhance the learning environment the use of posters, models and videos may be added. Patients will review and discuss the [facility] label reading guidelines presented in [employer]'s Label Reading Educational series video. Using fool labels brought in from local grocery stores and markets, patients will apply the label reading guidelines and determine if the packaged food meet the [facility]in guidelines. The purpose of this lesson is to provide patients with the opportunity to review, discuss, and practice hands-on learning of the [facility] Label Reading guidelines with actual packaged food labels. Cooking School  [facility]'s [employer] are designed to teach patients ways to prepare quick, simple, and affordable recipes at home. The importance of nutrition's role in chronic disease risk reduction is reflected in its emphasis in the overall [facility] program. By learning how to prepare essential core [facility] Eating Plan recipes, patients will increase control over what they eat; be able to customize the flavor of foods without the use of added salt, sugar, or fat; and improve the quality of the food they consume. By learning a set of core recipes which are easily assembled, quickly prepared, and affordable, patients are more likely to prepare more healthy foods at home. These workshops focus on convenient breakfasts, simple entres, side dishes, and desserts which can be prepared with minimal effort and are consistent with nutrition recommendations for cardiovascular risk reduction. Cooking [employer] are taught by a Armed forces logistics/support/administrative officer (RD) who has  been trained by the [employer]. The chef or RD has a clear understanding of the importance of minimizing - if not completely eliminating - added fat, sugar, and sodium in recipes. Throughout the series of Cooking School Workshop sessions, patients will learn about healthy ingredients and efficient methods of cooking to build confidence in their capability to prepare    Cooking School weekly topics:  Adding Flavor- Sodium-Free  Fast and Healthy Breakfasts  Powerhouse Plant-Based Proteins  Satisfying Salads and Dressings  Simple Sides and Sauces  International Cuisine-Spotlight on the [employer] Zones  Delicious Desserts  Savory Soups  [employer] - Meals in a Astronomer Appetizers and Snacks  Comforting Weekend Breakfasts  One-Pot Wonders   Fast Evening Meals  Landscape architect Your Pritikin Plate  WORKSHOPS   Healthy Mindset (Psychosocial):  Focused Goals, Sustainable Changes Clinical staff led group instruction and group discussion with PowerPoint presentation and patient guidebook. To enhance the learning environment the use of posters, models and videos may be added. Patients will be able to apply effective goal setting strategies to establish at least one personal goal, and then take consistent, meaningful action toward that goal. They will learn to identify common barriers to achieving personal goals and develop strategies to overcome them. Patients will also gain an understanding of how our mind-set can impact our ability to achieve goals and the importance of cultivating a positive and growth-oriented mind-set. The purpose of this lesson is to provide patients with a deeper understanding of how to set and achieve personal goals, as well as the tools and strategies needed to overcome common obstacles which may arise along the way.  From Head to Heart: The Power of a Healthy Outlook  Clinical staff led group instruction and group discussion with  PowerPoint presentation and patient guidebook. To enhance the learning environment the use of posters, models and videos may be added. Patients will be able to recognize and describe the impact of emotions and mood on physical health. They will discover the importance of self-care and explore self-care practices which may work for them. Patients will also learn how to utilize the 4 C's to cultivate a healthier outlook and better manage stress and challenges. The purpose of this lesson is to demonstrate to patients how a healthy outlook is an essential part of maintaining good health, especially as they continue their cardiac rehab journey.  Healthy Sleep for a Healthy Heart Clinical staff led group instruction and group discussion with PowerPoint presentation and patient guidebook. To enhance the learning environment the use of posters, models and videos may be added. At the conclusion of this workshop, patients will be able to demonstrate knowledge of the importance of sleep to overall health, well-being, and quality of life. They will understand the symptoms of, and treatments for, common sleep disorders. Patients will also be able to identify daytime and nighttime behaviors which impact sleep, and they will be able to apply these tools to help manage sleep-related challenges. The purpose of this lesson is to provide patients with a general overview of sleep and outline the importance of quality sleep. Patients will learn about a few of the most common sleep disorders. Patients will also be introduced to the concept of "sleep hygiene," and discover ways to self-manage certain sleeping problems through simple daily behavior changes. Finally, the workshop will motivate patients by clarifying the links between quality sleep and their goals of heart-healthy living.   Recognizing and Reducing Stress Clinical staff led group instruction and group discussion with PowerPoint presentation and patient guidebook. To  enhance the learning environment the use of posters, models and videos may be added. At the conclusion of this workshop, patients will be able to understand the types of stress reactions, differentiate between acute and chronic stress, and recognize the impact that chronic stress has on their health. They will also be able to apply different coping mechanisms, such as reframing negative self-talk. Patients will have the opportunity to practice a variety of stress management techniques, such as deep abdominal breathing, progressive muscle relaxation, and/or guided imagery.  The purpose of this lesson is to educate patients on the role of stress in their lives and to provide healthy techniques for coping with it.  Learning Barriers/Preferences:  Learning Barriers/Preferences - 07/26/23 1327       Learning Barriers/Preferences   Learning Barriers Sight   glaucoma   Learning Preferences Computer/Internet          Education Topics:  Knowledge Questionnaire Score:  Knowledge Questionnaire Score - 07/27/23 0834       Knowledge Questionnaire Score   Pre Score 22/24          Core Components/Risk Factors/Patient Goals at Admission:  Personal Goals and Risk Factors at Admission - 07/26/23 1343       Core Components/Risk Factors/Patient Goals on Admission    Weight Management Yes;Obesity;Weight Loss    Intervention Weight Management/Obesity: Establish reasonable short term and long term weight goals.;Obesity: Provide education and appropriate resources to help participant work on and attain dietary goals.    Admit Weight 222 lb 0.1 oz (100.7 kg)    Expected Outcomes Short Term: Continue to assess and modify interventions until short term weight is achieved;Long Term:  Adherence to nutrition and physical activity/exercise program aimed toward attainment of established weight goal;Weight Loss: Understanding of general recommendations for a balanced deficit meal plan, which promotes 1-2 lb weight  loss per week and includes a negative energy balance of [phone] kcal/d    Diabetes Yes    Intervention Provide education about signs/symptoms and action to take for hypo/hyperglycemia.;Provide education about proper nutrition, including hydration, and aerobic/resistive exercise prescription along with prescribed medications to achieve blood glucose in normal ranges: Fasting glucose 65-99 mg/dL    Expected Outcomes Short Term: Participant verbalizes understanding of the signs/symptoms and immediate care of hyper/hypoglycemia, proper foot care and importance of medication, aerobic/resistive exercise and nutrition plan for blood glucose control.;Long Term: Attainment of HbA1C < 7%.    Heart Failure Yes    Intervention Provide a combined exercise and nutrition program that is supplemented with education, support and counseling about heart failure. Directed toward relieving symptoms such as shortness of breath, decreased exercise tolerance, and extremity edema.    Expected Outcomes Improve functional capacity of life;Short term: Attendance in program 2-3 days a week with increased exercise capacity. Reported lower sodium intake. Reported increased fruit and vegetable intake. Reports medication compliance.;Short term: Daily weights obtained and reported for increase. Utilizing diuretic protocols set by physician.;Long term: Adoption of self-care skills and reduction of barriers for early signs and symptoms recognition and intervention leading to self-care maintenance.    Hypertension Yes    Intervention Monitor prescription use compliance.;Provide education on lifestyle modifcations including regular physical activity/exercise, weight management, moderate sodium restriction and increased consumption of fresh fruit, vegetables, and low fat dairy, alcohol moderation, and smoking cessation.    Expected Outcomes Short Term: Continued assessment and intervention until BP is < 140/80mm HG in hypertensive participants.  < 130/30mm HG in hypertensive participants with diabetes, heart failure or chronic kidney disease.;Long Term: Maintenance of blood pressure at goal levels.    Lipids Yes    Intervention Provide education and support for participant on nutrition & aerobic/resistive exercise along with prescribed medications to achieve LDL 70mg , HDL >40mg .    Expected Outcomes Short Term: Participant states understanding of desired cholesterol values and is compliant with medications prescribed. Participant is following exercise prescription and nutrition guidelines.;Long Term: Cholesterol controlled with medications as prescribed, with individualized exercise RX and with personalized nutrition plan. Value goals: LDL < 70mg , HDL > 40 mg.    Stress Yes    Intervention Offer individual and/or small group education and counseling on adjustment to heart disease, stress management and health-related lifestyle change. Teach and support self-help strategies.;Refer participants experiencing significant psychosocial distress to appropriate mental health specialists for further evaluation and treatment. When possible, include family members and significant others in education/counseling sessions.    Expected Outcomes Short Term: Participant demonstrates changes in health-related behavior, relaxation and other stress management skills, ability to obtain effective social support, and compliance with psychotropic medications if prescribed.;Long Term: Emotional wellbeing is indicated by absence of clinically significant psychosocial distress or social isolation.          Core Components/Risk Factors/Patient Goals Review:   Goals and Risk Factor Review     Row Name 08/11/23 1611 08/23/23 1804 09/20/23 1459         Core Components/Risk Factors/Patient Goals Review   Personal Goals Review Heart Failure;Hypertension;Lipids;Stress Heart Failure;Hypertension;Lipids;Stress Heart Failure;Hypertension;Lipids;Stress     Review Dawn Wood  started cardiac rehab on 08/03/23 and is off to a good start with exercise. Vital signs have been stable. patient was hypglycemic  on 08/05/23. Patient's PCP, Dr Jarold decreased insulin  on exercise days and ordered CGM for the patient which she is usiing now. Dawn Wood is off to a good start to exercise at cardiac rehabVital signs have been stable. CBG's have been variable. Exercise is on hold until after MRI is completed     Expected Outcomes Dawn Wood will continue to participate in cardiac rehab for exercise, nutrition and lifestyle modifications Dawn Wood will continue to participate in cardiac rehab for exercise, nutrition and lifestyle modifications Dawn Wood will continue to participate in cardiac rehab for exercise, nutrition and lifestyle modifications        Core Components/Risk Factors/Patient Goals at Discharge (Final Review):   Goals and Risk Factor Review - 09/20/23 1459       Core Components/Risk Factors/Patient Goals Review   Personal Goals Review Heart Failure;Hypertension;Lipids;Stress    Review Exercise is on hold until after MRI is completed    Expected Outcomes Dawn Wood will continue to participate in cardiac rehab for exercise, nutrition and lifestyle modifications          ITP Comments:  ITP Comments     Row Name 07/26/23 1259 08/05/23 1711 08/23/23 1802 09/20/23 1456     ITP Comments Medical Director- Dr. Wilbert Bihari, MD. Introduction to [facility] Education / Intensive Cardiac Rehab. Reviewed initial orientation folder. 30 Day ITP Review. Dawn Wood started cardiac rehab on 08/03/23 and is off to a good start to exercise for her fitness level 30 Day ITP Review. Dawn Wood has been out due to lower back pain.  cardiac rehab 30 Day ITP Review. Exercise remains on hold until  MRI completed       Comments: See ITP comments.Dawn Elpidio Quan RN BSN

## 2023-09-20 NOTE — Telephone Encounter (Signed)
 Left message to call cardiac rehab.Thayer Headings RN BSN

## 2023-09-21 ENCOUNTER — Encounter (HOSPITAL_COMMUNITY)

## 2023-09-23 ENCOUNTER — Encounter (HOSPITAL_COMMUNITY): Payer: Self-pay | Admitting: Physician Assistant

## 2023-09-23 ENCOUNTER — Ambulatory Visit (HOSPITAL_COMMUNITY)
Admission: RE | Admit: 2023-09-23 | Discharge: 2023-09-23 | Disposition: A | Discharge status: Home / Self Care | Source: Ambulatory Visit | Attending: Physician Assistant | Admitting: Physician Assistant

## 2023-09-23 ENCOUNTER — Encounter (HOSPITAL_COMMUNITY)

## 2023-09-23 VITALS — BP 148/76 | HR 52 | Ht 64.0 in | Wt 221.4 lb

## 2023-09-23 DIAGNOSIS — I480 Paroxysmal atrial fibrillation: Secondary | ICD-10-CM | POA: Diagnosis not present

## 2023-09-23 DIAGNOSIS — D6869 Other thrombophilia: Secondary | ICD-10-CM

## 2023-09-23 NOTE — Progress Notes (Signed)
 Primary Care Physician: Jarold Medici, MD Primary Cardiologist: Wyn Raddle, Jackee Shove, NP Electrophysiologist: None  Referring Physician: Dr Jonel Lorraine Collard Dawn Wood is a 78 y.o. female with a history of CAD, HTN, HLD, PVD, DM, OSA, CKD, atrial fibrillation who presents for follow up in the [facility].  The patient was diagnosed with afib in 2023 on a cardiac monitor. She presented to the ED 06/16/23 with symptoms of SOB and chest tightness with exertion. She was found to be in atrial flutter with elevated rates. She was also fluid overloaded and received IV lasix . She converted to SR with IV diltiazem  and was discharged with oral diltiazem . Patient is on Eliquis  for stroke prevention.   Patient returns for follow up for atrial fibrillation. She reports that she has done well from an afib standpoint. She had one episode of tachypalpitations that resolved quickly. She has taken on nitroglycerin  in the past two weeks. No bleeding issues on anticoagulation.   Today, she  denies symptoms of shortness of breath, orthopnea, PND,  dizziness, presyncope, syncope, snoring, daytime somnolence, bleeding, or neurologic sequela. The patient is tolerating medications without difficulties and is otherwise without complaint today.    Atrial Fibrillation Risk Factors:  she does have symptoms or diagnosis of sleep apnea. she does not have a history of rheumatic fever. she does not have a history of alcohol use. The patient does not have a history of early familial atrial fibrillation or other arrhythmias.  Atrial Fibrillation Management history:  Previous antiarrhythmic drugs: none Previous cardioversions: none Previous ablations: none Anticoagulation history: Eliquis   ROS- All systems are reviewed and negative except as per the HPI above.  Past Medical History:  Diagnosis Date   Asthma    Atrial fibrillation (HCC)    CKD (chronic kidney disease)    Coronary  artery disease    Diabetes mellitus without complication (HCC)    GERD (gastroesophageal reflux disease)    Heart attack (HCC) 12/2022   Hypertension    MI (myocardial infarction) (HCC)    Peripheral vascular disease (HCC)    Sleep apnea     Current Outpatient Medications  Medication Sig Dispense Refill   Accu-Chek Softclix Lancets lancets USE TO CHECK BLOOD SUGARS ONCE DAILY. 100 each 12   albuterol  (VENTOLIN  HFA) 108 (90 Base) MCG/ACT inhaler Inhale 2 puffs into the lungs every 6 (six) hours as needed for wheezing or shortness of breath. 8 g 2   BD PEN NEEDLE NANO 2ND GEN 32G X 4 MM MISC USE AS DIRECTED TWICE DAILY 100 each 2   Blood Glucose Monitoring Suppl (ACCU-CHEK GUIDE) w/Device KIT USE TO CHECK BLOOD SUGARS ONCE DAILY. 1 kit 0   budesonide -formoterol  (SYMBICORT ) 160-4.5 MCG/ACT inhaler Inhale 2 puffs into the lungs 2 (two) times daily. 10.2 g 11   Cholecalciferol (VITAMIN D3) 2000 units TABS Take 1 tablet by mouth daily.     colchicine  0.6 MG tablet Take 1 tablet (0.6 mg total) by mouth as needed (for gout). 30 tablet 1   Continuous Glucose Sensor (DEXCOM G7 SENSOR) MISC Use daily to check blood sugars. Change every 10 days. 4 each 2   diltiazem  (CARDIZEM  CD) 180 MG 24 hr capsule Take 1 capsule (180 mg total) by mouth daily. 90 capsule 1   dorzolamide -timolol  (COSOPT ) 2-0.5 % ophthalmic solution Place 1 drop into the right eye 2 (two) times daily.     ELIQUIS  5 MG TABS tablet TAKE 1 TABLET(5 MG) BY MOUTH TWICE  DAILY 180 tablet 1   Evolocumab  (REPATHA  SURECLICK) 140 MG/ML SOAJ Inject 140 mg into the skin every 14 (fourteen) days. 6 mL 3   fluticasone  (FLONASE ) 50 MCG/ACT nasal spray Place 2 sprays into both nostrils daily. 16 g 2   furosemide  (LASIX ) 20 MG tablet TAKE 1 TABLET(20 MG) BY MOUTH DAILY 30 tablet 10   glucose blood (ACCU-CHEK GUIDE) test strip USE TO CHECK BLOOD SUGARS ONCE DAILY. 100 each 12   insulin  aspart protamine - aspart (NOVOLOG  MIX 70/30 FLEXPEN) (70-30) 100  UNIT/ML FlexPen INJECT 40 UNITS SUBCUTANEOUS EVERY MORNING AND 50 UNITS SUBCUTANEOUS AT DINNER 30 mL 2   isosorbide  mononitrate (IMDUR ) 60 MG 24 hr tablet Take 1 tablet (60 mg total) by mouth daily. 90 tablet 2   Latanoprostene Bunod  (VYZULTA ) 0.024 % SOLN Place 1 drop into the right eye at bedtime.     methocarbamol  (ROBAXIN ) 500 MG tablet Take 1 tablet (500 mg total) by mouth every 8 (eight) hours as needed for muscle spasms. 30 tablet 0   metoprolol  succinate (TOPROL -XL) 100 MG 24 hr tablet Take 1.5 tablets (150 mg total) by mouth daily. Take with or immediately following a meal. 135 tablet 3   nitroGLYCERIN  (NITROSTAT ) 0.4 MG SL tablet Place 1 tablet (0.4 mg total) under the tongue every 5 (five) minutes as needed for chest pain. 25 tablet 11   olmesartan  (BENICAR ) 40 MG tablet TAKE 1 TABLET(40 MG) BY MOUTH DAILY 90 tablet 2   ONETOUCH VERIO test strip USE TO CHECK BLOOD SUGARS TWICE DAILY AS DIRECTED 100 strip 3   pantoprazole  (PROTONIX ) 40 MG tablet TAKE 1 TABLET(40 MG) BY MOUTH DAILY 90 tablet 1   polyethylene glycol (MIRALAX ) 17 g packet Take 17 g by mouth daily. 30 each 0   potassium chloride  (KLOR-CON ) 10 MEQ tablet Take 1 tablet (10 mEq total) by mouth at bedtime.     Semaglutide ,0.25 or 0.5MG /DOS, (OZEMPIC , 0.25 OR 0.5 MG/DOSE,) 2 MG/1.5ML SOPN INJECT 0.5MG  INTO SKIN ONCE A WEEK 4.5 mL 3   spironolactone  (ALDACTONE ) 25 MG tablet TAKE 1 TABLET(25 MG) BY MOUTH DAILY 90 tablet 1   No current facility-administered medications for this encounter.    Physical Exam: BP (!) 148/76   Pulse (!) 52   Ht 5' 4 (1.626 m)   Wt 100.4 kg   BMI 38.00 kg/m   GEN: Well nourished, well developed in no acute distress NECK: No JVD CARDIAC: Regular rate and rhythm, no murmurs, rubs, gallops RESPIRATORY:  Clear to auscultation without rales, wheezing or rhonchi  ABDOMEN: Soft, non-tender, non-distended EXTREMITIES:  No edema; No deformity    Wt Readings from Last 3 Encounters:  09/23/23 100.4  kg  08/24/23 99.8 kg  08/09/23 100.7 kg     EKG today demonstrates  SB Vent. rate 52 BPM PR interval 132 ms QRS duration 98 ms QT/QTcB 434/403 ms   Echo 12/01/22 demonstrated  1. Left ventricular ejection fraction, by estimation, is 60 to 65%. The  left ventricle has normal function. Left ventricular endocardial border  not optimally defined to evaluate regional wall motion. There is severe  left ventricular hypertrophy of the basal-septal segment. Left ventricular diastolic parameters are consistent with Grade I diastolic dysfunction (impaired relaxation).   2. Right ventricular systolic function is normal. The right ventricular  size is normal. There is normal pulmonary artery systolic pressure. The  estimated right ventricular systolic pressure is 4.4 mmHg.   3. The mitral valve is degenerative. Trivial mitral valve regurgitation.  No evidence of mitral stenosis.   4. The aortic valve was not well visualized. Aortic valve regurgitation  is not visualized. No aortic stenosis is present.   5. The inferior vena cava is normal in size with greater than 50%  respiratory variability, suggesting right atrial pressure of 3 mmHg.   6. Recommend repeat limited study using definity contrast to assess for  focal wall motion abnormalites.    CHA2DS2-VASc Score = 6  The patient's score is based upon: CHF History: 0 HTN History: 1 Diabetes History: 1 Stroke History: 0 Vascular Disease History: 1 Age Score: 2 Gender Score: 1       ASSESSMENT AND PLAN: Paroxysmal Atrial Fibrillation/atrial flutter (ICD10:  I48.0) The patient's CHA2DS2-VASc score is 6, indicating a 9.7% annual risk of stroke.   Patient appears to be maintaining SR with rare episodes. If her episodes become more frequent could consider Multaq, dofetilide, amiodarone, or ablation.  Continue diltiazem  180 mg daily Continue Eliquis  5 mg BID Continue Toprol  150 mg daily  Secondary Hypercoagulable State (ICD10:   D68.69) The patient is at significant risk for stroke/thromboembolism based upon her CHA2DS2-VASc Score of 6.  Continue Apixaban  (Eliquis ). No bleeding issues.   CAD No anginal symptoms Followed by Dr Alvan previously, has visit with Dr Wendel to establish care.   HTN Stable on current regimen  OSA  The importance of adequate treatment of sleep apnea was discussed today in order to improve our ability to maintain sinus rhythm long term Patient declined to retry CPAP. Previously followed by Dr Buck.  Obesity Body mass index is 38 kg/m.  Encouraged lifestyle modification   Follow up with Dr Wendel as scheduled. AF clinic as needed.       Daril Kicks PA-C Af[facility] Clinic [facility] [address] [place], [place] [address] [phone]

## 2023-09-26 ENCOUNTER — Encounter (HOSPITAL_COMMUNITY)

## 2023-09-27 ENCOUNTER — Ambulatory Visit
Admission: RE | Admit: 2023-09-27 | Discharge: 2023-09-27 | Disposition: A | Discharge status: Home / Self Care | Source: Ambulatory Visit | Attending: Internal Medicine | Admitting: Internal Medicine

## 2023-09-27 DIAGNOSIS — M5416 Radiculopathy, lumbar region: Secondary | ICD-10-CM

## 2023-09-27 DIAGNOSIS — M47816 Spondylosis without myelopathy or radiculopathy, lumbar region: Secondary | ICD-10-CM | POA: Diagnosis not present

## 2023-09-27 DIAGNOSIS — M4316 Spondylolisthesis, lumbar region: Secondary | ICD-10-CM | POA: Diagnosis not present

## 2023-09-27 DIAGNOSIS — M48061 Spinal stenosis, lumbar region without neurogenic claudication: Secondary | ICD-10-CM | POA: Diagnosis not present

## 2023-09-28 ENCOUNTER — Encounter (HOSPITAL_COMMUNITY): Admission: RE | Admit: 2023-09-28 | Source: Ambulatory Visit

## 2023-09-28 NOTE — Progress Notes (Deleted)
 Cardiology Office Note:   Date:  09/28/2023  ID:  Quanasia, Defino November 06, 1945, MRN [id number] PCP:  Jarold Medici, MD  [facility] Providers Cardiologist:  Wendel Haws, MD Referring MD: Jarold Medici, MD  Chief Complaint/Reason for Referral: Follow-up for CAD ASSESSMENT:    1. CAD in native artery   2. Diastolic dysfunction with chronic heart failure (HCC)   3. Paroxysmal atrial fibrillation (HCC)   4. Secondary hypercoagulable state (HCC)   5. Type 2 diabetes mellitus with complication, with long-term current use of insulin  (HCC)   6. Hypertension associated with diabetes (HCC)   7. Hyperlipidemia associated with type 2 diabetes mellitus (HCC)   8. Aortic atherosclerosis (HCC)   9. CKD stage 3 due to type 2 diabetes mellitus (HCC)   10. PVD (peripheral vascular disease) (HCC)   11. BMI 38.0-38.9,adult     PLAN:   In order of problems listed above: Coronary artery disease: Continue Eliquis  5 mg twice daily in lieu of Plavix ; continue Repatha  140 mg every 2 weeks.  Continue as needed nitroglycerin . Diastolic dysfunction: Continue Toprol  150 mg daily, olmesartan  40 mg daily, spironolactone  25 mg daily, Lasix  20 mg daily, and start Jardiance 10 mg daily.*** Paroxysmal atrial fibrillation: Followed by atrial fibrillation clinic; continue Eliquis  5 mg twice daily, Toprol  150 mg daily and diltiazem  180 mg daily Secondary hypercoagulable state: Continue Eliquis  5 mg twice daily T2DM: Continue Eliquis  5 mg twice daily, olmesartan  40 mg daily, intolerant of statins, start Jardiance 10 mg daily, and continue semaglutide  0.5 mg q. weekly.*** Hypertension: Continue Toprol  150 mg daily, spironolactone  25 mg daily and olmesartan  40 mg daily*** Hyperlipidemia: Continue Repatha  and 40 mg every 2 weeks.  Check lipid panel today.  Goal LDL is less than 55 given history of STEMI. Aortic atherosclerosis: Continue Eliquis  5 mg twice daily and Repatha  140 mg every 2 weeks. CKD stage  III: Continue olmesartan  40 mg and start Jardiance 10 mg daily for renal protection.*** Peripheral vascular disease: Abnormal ABIs recently.  Claudication?*** Elevated BMI: Continue semaglutide  0.5 mg q. weekly.        {Are you ordering a CV Procedure (e.g. stress test, cath, DCCV, TEE, etc)?   Press F2        :[id number]}   Dispo:  No follow-ups on file.       Labs/tests ordered: No orders of the defined types were placed in this encounter.   Current medicines are reviewed at length with the patient today.  The patient {ACTIONS; HAS/DOES NOT HAVE:19233} concerns regarding medicines.  I spent *** minutes reviewing all clinical data during and prior to this visit including all relevant imaging studies, laboratories, clinical information from other health systems and prior notes from both Cardiology and other specialties, interviewing the patient, conducting a complete physical examination, and coordinating care in order to formulate a comprehensive and personalized evaluation and treatment plan.   History of Present Illness:    FOCUSED PROBLEM LIST:   CAD STEMI status post PCI LAD 2015 Edge ISR LAD stent status post PCI with POBA of CTO RCA 2024 Diastolic dysfunction Severe LVH, G1 DD, no significant valve issues, EF 60 to 65% TTE 2024 T2DM On insulin  Hypertension Hyperlipidemia LP(a) 240 Aortic atherosclerosis Chest CT 2024 CKD stage IIIa BMI 38 Peripheral arterial disease Abnormal left ABI 2025 PAF CV 2 score 6 On Eliquis  OSA Incomplete compliance with CPAP  July 2025:  Patient consents to use of AI scribe. The patient returns for routine follow-up.  She was  last seen in November.  At that point in time she had endorsed lower extremity edema and shortness of breath.  Her Lasix  was increased.  proBNP was checked but was not elevated.  Her creatinine was consistent with CKD stage IIIa disease.  She was also noted to have upper extremity unilateral swelling and not  ultrasound was negative for DVT.     Current Medications: No outpatient medications have been marked as taking for the 10/04/23 encounter (Appointment) with Shawan Tosh K, MD.     Review of Systems:   Please see the history of present illness.    All other systems reviewed and are negative.     EKGs/Labs/Other Test Reviewed:   EKG: 2025 sinus bradycardia with LVH criteria  EKG Interpretation Date/Time:    Ventricular Rate:    PR Interval:    QRS Duration:    QT Interval:    QTC Calculation:   R Axis:      Text Interpretation:           Risk Assessment/Calculations:   {Does this patient have ATRIAL FIBRILLATION?:[phone]}      Physical Exam:   VS:  There were no vitals taken for this visit.   No BP recorded.  {Refresh Note OR Click here to enter BP  :1}***   Wt Readings from Last 3 Encounters:  09/23/23 221 lb 6.4 oz (100.4 kg)  08/24/23 220 lb (99.8 kg)  08/09/23 222 lb (100.7 kg)      GENERAL:  No apparent distress, AOx3 HEENT:  No carotid bruits, +2 carotid impulses, no scleral icterus CAR: RRR Irregular RR*** no murmurs***, gallops, rubs, or thrills RES:  Clear to auscultation bilaterally ABD:  Soft, nontender, nondistended, positive bowel sounds x 4 VASC:  +2 radial pulses, +2 carotid pulses NEURO:  CN 2-12 grossly intact; motor and sensory grossly intact PSYCH:  No active depression or anxiety EXT:  No edema, ecchymosis, or cyanosis  Signed, Henretta Quist K Danyell Awbrey, MD  09/28/2023 5:52 AM    [facility] [address] [place], [place], [place]  [address] Phone: [phone]; Fax: [phone]   Note:  This document was prepared using Dragon voice recognition software and may include unintentional dictation errors.

## 2023-09-30 ENCOUNTER — Encounter (HOSPITAL_COMMUNITY)
Admission: RE | Admit: 2023-09-30 | Discharge: 2023-09-30 | Disposition: A | Discharge status: Home / Self Care | Source: Ambulatory Visit | Attending: Internal Medicine | Admitting: Internal Medicine

## 2023-09-30 ENCOUNTER — Telehealth (HOSPITAL_COMMUNITY): Payer: Self-pay | Admitting: *Deleted

## 2023-09-30 DIAGNOSIS — Z48812 Encounter for surgical aftercare following surgery on the circulatory system: Secondary | ICD-10-CM | POA: Insufficient documentation

## 2023-09-30 DIAGNOSIS — Z955 Presence of coronary angioplasty implant and graft: Secondary | ICD-10-CM | POA: Insufficient documentation

## 2023-09-30 DIAGNOSIS — I252 Old myocardial infarction: Secondary | ICD-10-CM | POA: Insufficient documentation

## 2023-09-30 NOTE — Telephone Encounter (Signed)
 Left message to call cardiac rehab.Thayer Headings RN BSN

## 2023-10-03 ENCOUNTER — Encounter (HOSPITAL_COMMUNITY): Admission: RE | Admit: 2023-10-03 | Source: Ambulatory Visit

## 2023-10-03 ENCOUNTER — Ambulatory Visit: Attending: Internal Medicine | Admitting: Internal Medicine

## 2023-10-03 ENCOUNTER — Encounter: Payer: Self-pay | Admitting: Internal Medicine

## 2023-10-03 VITALS — BP 134/62 | HR 58 | Ht 64.5 in | Wt 220.0 lb

## 2023-10-03 DIAGNOSIS — E1169 Type 2 diabetes mellitus with other specified complication: Secondary | ICD-10-CM

## 2023-10-03 DIAGNOSIS — N1831 Chronic kidney disease, stage 3a: Secondary | ICD-10-CM

## 2023-10-03 DIAGNOSIS — E1159 Type 2 diabetes mellitus with other circulatory complications: Secondary | ICD-10-CM

## 2023-10-03 DIAGNOSIS — E785 Hyperlipidemia, unspecified: Secondary | ICD-10-CM

## 2023-10-03 DIAGNOSIS — E1122 Type 2 diabetes mellitus with diabetic chronic kidney disease: Secondary | ICD-10-CM | POA: Diagnosis not present

## 2023-10-03 DIAGNOSIS — D6869 Other thrombophilia: Secondary | ICD-10-CM

## 2023-10-03 DIAGNOSIS — Z794 Long term (current) use of insulin: Secondary | ICD-10-CM

## 2023-10-03 DIAGNOSIS — I249 Acute ischemic heart disease, unspecified: Secondary | ICD-10-CM | POA: Diagnosis not present

## 2023-10-03 DIAGNOSIS — I739 Peripheral vascular disease, unspecified: Secondary | ICD-10-CM

## 2023-10-03 DIAGNOSIS — I700 Atherosclerosis of aorta: Secondary | ICD-10-CM | POA: Diagnosis not present

## 2023-10-03 DIAGNOSIS — I5032 Chronic diastolic (congestive) heart failure: Secondary | ICD-10-CM | POA: Diagnosis not present

## 2023-10-03 DIAGNOSIS — Z6838 Body mass index (BMI) 38.0-38.9, adult: Secondary | ICD-10-CM

## 2023-10-03 DIAGNOSIS — R609 Edema, unspecified: Secondary | ICD-10-CM

## 2023-10-03 DIAGNOSIS — I480 Paroxysmal atrial fibrillation: Secondary | ICD-10-CM

## 2023-10-03 DIAGNOSIS — I152 Hypertension secondary to endocrine disorders: Secondary | ICD-10-CM

## 2023-10-03 MED ORDER — EMPAGLIFLOZIN 10 MG PO TABS: 10.0000 mg | ORAL_TABLET | Freq: Every day | ORAL | 5 refills | Status: DC

## 2023-10-03 NOTE — Patient Instructions (Addendum)
 Medication Instructions:  Your physician has recommended you make the following change in your medication:  1.) start jardiance  10 mg - take one tablet daily before breakfast  *If you need a refill on your cardiac medications before your next appointment, please call your pharmacy*  Lab Work: Today: lipid panel - first floor [facility]  If you have labs (blood work) drawn today and your tests are completely normal, you will receive your results only by: MyChart Message (if you have MyChart) OR A paper copy in the mail If you have any lab test that is abnormal or we need to change your treatment, we will call you to review the results.  Testing/Procedures: (lower extremity ultrasound for reflux) Your physician has requested that you have a lower extremity venous duplex. This test is an ultrasound of the veins in the legs . It looks at venous blood flow that carries blood from the heart to the legs. Allow one hour for a Lower Venous exam. . There are no restrictions or special instructions.  Please note: We ask at that you not bring children with you during ultrasound (echo/ vascular) testing. Due to room size and safety concerns, children are not allowed in the ultrasound rooms during exams. Our front office staff cannot provide observation of children in our lobby area while testing is being conducted. An adult accompanying a patient to their appointment will only be allowed in the ultrasound room at the discretion of the ultrasound technician under special circumstances. We apologize for any inconvenience.   Follow-Up: At [facility], you and your health needs are our priority.  As part of our continuing mission to provide you with exceptional heart care, our providers are all part of one team.  This team includes your primary Cardiologist (physician) and Advanced Practice Providers or APPs (Physician Assistants and Nurse Practitioners) who all work together to provide you with the care  you need, when you need it.  Your next appointment:   6 month(s)  Provider:   Arun K Thukkani, MD

## 2023-10-03 NOTE — Progress Notes (Signed)
 Cardiology Office Note:   Date:  10/03/2023  ID:  Dawn, Wood 1945/04/22, MRN [id number] PCP:  Jarold Medici, MD  [facility] Providers Cardiologist:  Wendel Haws, MD Referring MD: Jarold Medici, MD  Chief Complaint/Reason for Referral: Follow-up for CAD ASSESSMENT:    1. ACS (acute coronary syndrome) (HCC)   2. Chronic diastolic heart failure (HCC)   3. Paroxysmal atrial fibrillation (HCC)   4. Secondary hypercoagulable state (HCC)   5. Type 2 diabetes mellitus with stage 3a chronic kidney disease, with long-term current use of insulin  (HCC)   6. Hypertension associated with diabetes (HCC)   7. Hyperlipidemia associated with type 2 diabetes mellitus (HCC)   8. Aortic atherosclerosis (HCC)   9. Stage 3a chronic kidney disease (HCC)   10. PVD (peripheral vascular disease) (HCC)   11. BMI 38.0-38.9,adult   12. Swelling      PLAN:   In order of problems listed above: Coronary artery disease/ACS: Continue Eliquis  5 mg twice daily in lieu of Plavix ; continue Repatha  140 mg every 2 weeks.  Continue as needed nitroglycerin . Diastolic dysfunction: Continue Toprol  150 mg daily, olmesartan  40 mg daily, spironolactone  25 mg daily, Lasix  20 mg daily, and start Jardiance  10 mg daily. Paroxysmal atrial fibrillation: Followed by atrial fibrillation clinic; continue Eliquis  5 mg twice daily, Toprol  150 mg daily and diltiazem  180 mg daily Secondary hypercoagulable state: Continue Eliquis  5 mg twice daily T2DM: Continue Eliquis  5 mg twice daily, olmesartan  40 mg daily, intolerant of statins, start Jardiance  10 mg daily, and continue semaglutide  0.5 mg q. weekly. Hypertension: Continue Toprol  150 mg daily, spironolactone  25 mg daily and olmesartan  40 mg daily Hyperlipidemia: Continue Repatha  and 40 mg every 2 weeks.  Check lipid panel today.  Goal LDL is less than 55 given history of STEMI. Aortic atherosclerosis: Continue Eliquis  5 mg twice daily and Repatha  140 mg every 2  weeks. CKD stage III: Continue olmesartan  40 mg and start Jardiance  10 mg daily for renal protection. Peripheral vascular disease: Abnormal ABIs recently.  Has had claudication symptoms left leg, refer to [facility]. Elevated BMI: Continue semaglutide  0.5 mg q. weekly. LE edema: Will obtain lower extremity ultrasound to evaluate for reflux and referred to [facility].            Dispo:  Return in about 6 months (around 04/04/2024).       Labs/tests ordered: Orders Placed This Encounter  Procedures   Lipid Profile   Ambulatory referral to Vascular Surgery   VAS US  LOWER EXTREMITY VENOUS REFLUX    Current medicines are reviewed at length with the patient today.  The patient does not have concerns regarding medicines.  I spent 36 minutes reviewing all clinical data during and prior to this visit including all relevant imaging studies, laboratories, clinical information from other health systems and prior notes from both Cardiology and other specialties, interviewing the patient, conducting a complete physical examination, and coordinating care in order to formulate a comprehensive and personalized evaluation and treatment plan.   History of Present Illness:    FOCUSED PROBLEM LIST:   CAD STEMI status post PCI LAD 2015 Edge ISR LAD stent status post PCI with POBA of CTO RCA 2024 Diastolic dysfunction Severe LVH, G1 DD, no significant valve issues, EF 60 to 65% TTE 2024 T2DM On insulin  Hypertension Hyperlipidemia LP(a) 240 Aortic atherosclerosis Chest CT 2024 CKD stage IIIa BMI 38 Peripheral arterial disease Abnormal left ABI 2025 PAF CV 2 score 6 On Eliquis  OSA Incomplete compliance with  CPAP  July 2025:  Patient consents to use of AI scribe. The patient returns for routine follow-up.  She was last seen in November.  At that point in time she had endorsed lower extremity edema and shortness of breath.  Her Lasix  was increased.  proBNP was checked but was not elevated.  Her  creatinine was consistent with CKD stage IIIa disease.  She was also noted to have upper extremity unilateral swelling and not ultrasound was negative for DVT.  She experiences cramps in her legs, particularly when moving around, and swelling in her feet and ankles. There is discoloration in her foot. She has been wearing compression stockings since her last heart attack in 2024, and her leg symptoms have worsened since then.  She mentions having back problems and underwent an MRI last week, though she has not yet received the results. She experiences pain across her back and stiffness, which she associates with her leg pain.  She is on Ozempic  for weight management but has not noticed any weight loss, possibly due to occasionally forgetting to take the shot. She wants to lose weight, noting that she has gained weight since retiring in 2023 and is less active.  She reports frequent urination and mentions that her primary care doctor often finds blood in her urine during checks, though no further testing has been done. No frequent urinary tract infections or yeast infections. She experiences shortness of breath when walking, which she attributes to her weight gain and decreased activity since retirement.       Current Medications: Current Meds  Medication Sig   Accu-Chek Softclix Lancets lancets USE TO CHECK BLOOD SUGARS ONCE DAILY.   albuterol  (VENTOLIN  HFA) 108 (90 Base) MCG/ACT inhaler Inhale 2 puffs into the lungs every 6 (six) hours as needed for wheezing or shortness of breath.   BD PEN NEEDLE NANO 2ND GEN 32G X 4 MM MISC USE AS DIRECTED TWICE DAILY   Blood Glucose Monitoring Suppl (ACCU-CHEK GUIDE) w/Device KIT USE TO CHECK BLOOD SUGARS ONCE DAILY.   budesonide -formoterol  (SYMBICORT ) 160-4.5 MCG/ACT inhaler Inhale 2 puffs into the lungs 2 (two) times daily.   Cholecalciferol (VITAMIN D3) 2000 units TABS Take 1 tablet by mouth daily.   colchicine  0.6 MG tablet Take 1 tablet (0.6 mg total)  by mouth as needed (for gout).   Continuous Glucose Sensor (DEXCOM G7 SENSOR) MISC Use daily to check blood sugars. Change every 10 days.   diltiazem  (CARDIZEM  CD) 180 MG 24 hr capsule Take 1 capsule (180 mg total) by mouth daily.   dorzolamide -timolol  (COSOPT ) 2-0.5 % ophthalmic solution Place 1 drop into the right eye 2 (two) times daily.   ELIQUIS  5 MG TABS tablet TAKE 1 TABLET(5 MG) BY MOUTH TWICE DAILY   empagliflozin  (JARDIANCE ) 10 MG TABS tablet Take 1 tablet (10 mg total) by mouth daily before breakfast.   Evolocumab  (REPATHA  SURECLICK) 140 MG/ML SOAJ Inject 140 mg into the skin every 14 (fourteen) days.   fluticasone  (FLONASE ) 50 MCG/ACT nasal spray Place 2 sprays into both nostrils daily.   furosemide  (LASIX ) 20 MG tablet TAKE 1 TABLET(20 MG) BY MOUTH DAILY   glucose blood (ACCU-CHEK GUIDE) test strip USE TO CHECK BLOOD SUGARS ONCE DAILY.   insulin  aspart protamine - aspart (NOVOLOG  MIX 70/30 FLEXPEN) (70-30) 100 UNIT/ML FlexPen INJECT 40 UNITS SUBCUTANEOUS EVERY MORNING AND 50 UNITS SUBCUTANEOUS AT DINNER   isosorbide  mononitrate (IMDUR ) 60 MG 24 hr tablet Take 1 tablet (60 mg total) by mouth daily.  Latanoprostene Bunod  (VYZULTA ) 0.024 % SOLN Place 1 drop into the right eye at bedtime.   methocarbamol  (ROBAXIN ) 500 MG tablet Take 1 tablet (500 mg total) by mouth every 8 (eight) hours as needed for muscle spasms.   metoprolol  succinate (TOPROL -XL) 100 MG 24 hr tablet Take 1.5 tablets (150 mg total) by mouth daily. Take with or immediately following a meal.   nitroGLYCERIN  (NITROSTAT ) 0.4 MG SL tablet Place 1 tablet (0.4 mg total) under the tongue every 5 (five) minutes as needed for chest pain.   olmesartan  (BENICAR ) 40 MG tablet TAKE 1 TABLET(40 MG) BY MOUTH DAILY   ONETOUCH VERIO test strip USE TO CHECK BLOOD SUGARS TWICE DAILY AS DIRECTED   pantoprazole  (PROTONIX ) 40 MG tablet TAKE 1 TABLET(40 MG) BY MOUTH DAILY   polyethylene glycol (MIRALAX ) 17 g packet Take 17 g by mouth daily.    potassium chloride  (KLOR-CON ) 10 MEQ tablet Take 1 tablet (10 mEq total) by mouth at bedtime.   Semaglutide ,0.25 or 0.5MG /DOS, (OZEMPIC , 0.25 OR 0.5 MG/DOSE,) 2 MG/1.5ML SOPN INJECT 0.5MG  INTO SKIN ONCE A WEEK   spironolactone  (ALDACTONE ) 25 MG tablet TAKE 1 TABLET(25 MG) BY MOUTH DAILY     Review of Systems:   Please see the history of present illness.    All other systems reviewed and are negative.     EKGs/Labs/Other Test Reviewed:   EKG: 2025 sinus bradycardia with LVH criteria  EKG Interpretation Date/Time:    Ventricular Rate:    PR Interval:    QRS Duration:    QT Interval:    QTC Calculation:   R Axis:      Text Interpretation:           Risk Assessment/Calculations:    CHA2DS2-VASc Score = 6   This indicates a 9.7% annual risk of stroke. The patient's score is based upon: CHF History: 0 HTN History: 1 Diabetes History: 1 Stroke History: 0 Vascular Disease History: 1 Age Score: 2 Gender Score: 1         Physical Exam:   VS:  BP 134/62   Pulse (!) 58   Ht 5' 4.5 (1.638 m)   Wt 220 lb (99.8 kg)   SpO2 98%   BMI 37.18 kg/m        Wt Readings from Last 3 Encounters:  10/03/23 220 lb (99.8 kg)  09/23/23 221 lb 6.4 oz (100.4 kg)  08/24/23 220 lb (99.8 kg)      GENERAL:  No apparent distress, AOx3 HEENT:  No carotid bruits, +2 carotid impulses, no scleral icterus CAR: RRR no murmurs, gallops, rubs, or thrills RES:  Clear to auscultation bilaterally ABD:  Soft, nontender, nondistended, positive bowel sounds x 4 VASC:  +2 radial pulses, +2 carotid pulses NEURO:  CN 2-12 grossly intact; motor and sensory grossly intact PSYCH:  No active depression or anxiety EXT:  + 2 nonpitting edema edema with mild venous stasis discoloration Lurena MARLA Red, MD  10/03/2023 3:30 PM    [facility] [address] [place], [place], [place]  [address] Phone: [phone]; Fax: [phone]   Note:  This document was prepared using Dragon voice  recognition software and may include unintentional dictation errors.

## 2023-10-04 ENCOUNTER — Ambulatory Visit: Admitting: Internal Medicine

## 2023-10-04 ENCOUNTER — Telehealth (HOSPITAL_COMMUNITY): Payer: Self-pay | Admitting: *Deleted

## 2023-10-04 ENCOUNTER — Ambulatory Visit: Payer: Self-pay | Admitting: Internal Medicine

## 2023-10-04 ENCOUNTER — Encounter (HOSPITAL_COMMUNITY): Payer: Self-pay | Admitting: *Deleted

## 2023-10-04 DIAGNOSIS — I739 Peripheral vascular disease, unspecified: Secondary | ICD-10-CM

## 2023-10-04 DIAGNOSIS — Z794 Long term (current) use of insulin: Secondary | ICD-10-CM

## 2023-10-04 DIAGNOSIS — E1159 Type 2 diabetes mellitus with other circulatory complications: Secondary | ICD-10-CM

## 2023-10-04 DIAGNOSIS — I5032 Chronic diastolic (congestive) heart failure: Secondary | ICD-10-CM

## 2023-10-04 DIAGNOSIS — I480 Paroxysmal atrial fibrillation: Secondary | ICD-10-CM

## 2023-10-04 DIAGNOSIS — I213 ST elevation (STEMI) myocardial infarction of unspecified site: Secondary | ICD-10-CM

## 2023-10-04 DIAGNOSIS — Z6838 Body mass index (BMI) 38.0-38.9, adult: Secondary | ICD-10-CM

## 2023-10-04 DIAGNOSIS — Z955 Presence of coronary angioplasty implant and graft: Secondary | ICD-10-CM

## 2023-10-04 DIAGNOSIS — N183 Chronic kidney disease, stage 3 unspecified: Secondary | ICD-10-CM

## 2023-10-04 DIAGNOSIS — I2510 Atherosclerotic heart disease of native coronary artery without angina pectoris: Secondary | ICD-10-CM

## 2023-10-04 DIAGNOSIS — E1169 Type 2 diabetes mellitus with other specified complication: Secondary | ICD-10-CM

## 2023-10-04 DIAGNOSIS — I700 Atherosclerosis of aorta: Secondary | ICD-10-CM

## 2023-10-04 DIAGNOSIS — D6869 Other thrombophilia: Secondary | ICD-10-CM

## 2023-10-04 LAB — LIPID PANEL
Chol/HDL Ratio: 3 ratio (ref 0.0–4.4)
Cholesterol, Total: 132 mg/dL (ref 100–199)
HDL: 44 mg/dL (ref >39)
LDL Chol Calc (NIH): 56 mg/dL (ref 0–99)
Triglycerides: 199 mg/dL — ABNORMAL HIGH (ref 0–149)
VLDL Cholesterol Cal: 32 mg/dL (ref 5–40)

## 2023-10-04 NOTE — Telephone Encounter (Signed)
 Spoke with Dawn Wood. Dawn Wood says she is waiting to hear back about the results of her MRI. Will discharge from cardiac rehab at this time due to nonattendance. Patient states understanding.Hadassah Elpidio Quan RN BSN

## 2023-10-04 NOTE — Progress Notes (Signed)
 Dawn Wood attended 10 exercise and education classes between 07/26/23- 08/15/23. Dawn Wood did fair with exercise while in attendance. Dawn Wood has been absent due to chronic back pain and has been discharged due to nonattendance.Hadassah Elpidio Quan RN BSN

## 2023-10-05 ENCOUNTER — Encounter (HOSPITAL_COMMUNITY)

## 2023-10-05 ENCOUNTER — Ambulatory Visit: Payer: Self-pay | Admitting: Internal Medicine

## 2023-10-07 ENCOUNTER — Encounter (HOSPITAL_COMMUNITY)

## 2023-10-10 ENCOUNTER — Encounter (HOSPITAL_COMMUNITY)

## 2023-10-11 ENCOUNTER — Other Ambulatory Visit: Payer: Self-pay | Admitting: Vascular Surgery

## 2023-10-11 DIAGNOSIS — M79606 Pain in leg, unspecified: Secondary | ICD-10-CM

## 2023-10-12 ENCOUNTER — Encounter (HOSPITAL_COMMUNITY)

## 2023-10-12 ENCOUNTER — Other Ambulatory Visit: Payer: Self-pay | Admitting: Internal Medicine

## 2023-10-13 ENCOUNTER — Other Ambulatory Visit: Payer: Self-pay | Admitting: Internal Medicine

## 2023-10-13 MED ORDER — TRAMADOL HCL 50 MG PO TABS: 50.0000 mg | ORAL_TABLET | Freq: Two times a day (BID) | ORAL | 0 refills | Status: DC | PRN

## 2023-10-14 ENCOUNTER — Encounter (HOSPITAL_COMMUNITY)

## 2023-10-17 ENCOUNTER — Encounter (HOSPITAL_COMMUNITY)

## 2023-10-19 ENCOUNTER — Ambulatory Visit

## 2023-10-19 ENCOUNTER — Encounter (HOSPITAL_COMMUNITY)

## 2023-10-20 ENCOUNTER — Ambulatory Visit (HOSPITAL_BASED_OUTPATIENT_CLINIC_OR_DEPARTMENT_OTHER): Admitting: Adult Health

## 2023-10-20 ENCOUNTER — Encounter (HOSPITAL_BASED_OUTPATIENT_CLINIC_OR_DEPARTMENT_OTHER): Payer: Self-pay | Admitting: Adult Health

## 2023-10-20 VITALS — BP 162/77 | HR 57 | Wt 220.0 lb

## 2023-10-20 DIAGNOSIS — R918 Other nonspecific abnormal finding of lung field: Secondary | ICD-10-CM | POA: Diagnosis not present

## 2023-10-20 NOTE — Progress Notes (Signed)
 @Patient  ID: Dawn Wood, female    DOB: 05-Jan-1946, 78 y.o.   MRN: [id number]  Chief Complaint  Patient presents with   Follow-up    Referring provider: Jarold Medici, MD  HPI: 78 year old female former smoker seen for pulmonary consult March 31, 2023 for shortness of breath felt to have asthma Medical history significant for coronary artery disease, MI, A-fib, diabetes, obstructive sleep apnea-CPAP intolerant  TEST/EVENTS :  HST 05/25/22 - 12.5/h PAP titration 10/25/22 - AutoPAP 9 cm with EPR 2 recommended  PFTs May 25, 2023 showed moderate restriction with positive bronchodilator response, FEV1 68%, ratio 83, FVC 61%, positive bronchodilator response with 24% change, DLCO 70%.  CT chest August 13, 2022 mild peripheral subpleural reticulation and parenchymal banding bibasilar, 2 mm left apical nodule  2D echo December 01, 2023 EF 6065%, severe LVH, grade 1 diastolic dysfunction, normal pulmonary artery systolic pressure  10/20/2023 Follow up : Asthma Discussed the use of AI scribe software for clinical note transcription with the patient, who gave verbal consent to proceed.  History of Present Illness Dawn Wood is a 78 year old female with asthma who presents for a follow-up.  Last visit patient underwent pulmonary function testing that showed moderate restriction and significant bronchodilator response consistent with asthma.  She was prescribed Symbicort in March for asthma, with instructions to take two puffs twice daily and use Albuterol as needed.  Patient says that she tried Symbicort briefly but did not feel that it made much difference.  Also felt that it was too expensive as she has multiple medications.  She experiences shortness of breath when walking distances and wheezing, particularly in the mornings. She also has coughing, congestion, and difficulty expectorating phlegm.  She has a history of smoking for 45 years, though she quit over ten  years ago. No history of bronchitis or pneumonia.  She has diastolic congestive heart failure and was hospitalized in April for atrial fibrillation.  She also has mild sleep apnea but does not use a CPAP machine due to discomfort, describing a sensation of smothering.  She is on multiple medications, including a cholesterol shot and Eliquis, both costing $40 each. She is on a program for her insulin that provides it at no cost.  She has difficulty with physical activity, noting that she used to walk and attend rehab but had to stop due to back issues and arthritis in her spine.     Allergies  Allergen Reactions   Dust Mite Extract Other (See Comments)    REACTION: SNEEZING   Other Other (See Comments)    ALLERGEN: DEODORIZERS SPRAY AND PERFUMES REACTION: SNEEZING   Statins Other (See Comments)    Muscle aches with rosuvastatin 5 mg daily and 3x/weekly, atorvastatin 20 mg daily and 3x/week, simvastatin 40 mg daily    Allopurinol     Diarrhea and the pt said it made her sugars low   Pollen Extract Other (See Comments)    Reaction unknown    Immunization History  Administered Date(s) Administered   Fluad Quad(high Dose 65+) 04/06/2021, 12/30/2021   Fluad Trivalent(High Dose 65+) 04/21/2023   Janssen (J&J) SARS-COV-2 Vaccination 05/25/2019   PFIZER(Purple Top)SARS-COV-2 Vaccination 02/21/2020   Tdap 11/04/2017    Past Medical History:  Diagnosis Date   Asthma    Atrial fibrillation (HCC)    CKD (chronic kidney disease)    Coronary artery disease    Diabetes mellitus without complication (HCC)    GERD (gastroesophageal reflux disease)  Heart attack (HCC) 12/2022   Hypertension    MI (myocardial infarction) (HCC)    Peripheral vascular disease (HCC)    Sleep apnea     Tobacco History: Social History   Tobacco Use  Smoking Status Former   Current packs/day: 0.00   Average packs/day: 0.3 packs/day for 45.0 years (11.3 ttl pk-yrs)   Types: Cigarettes   Start date:  01/11/1968   Quit date: 01/10/2013   Years since quitting: 10.7   Passive exposure: Never  Smokeless Tobacco Never  Tobacco Comments   Former smoker 06/24/23   Counseling given: Not Answered Tobacco comments: Former smoker 06/24/23   Outpatient Medications Prior to Visit  Medication Sig Dispense Refill   Accu-Chek Softclix Lancets lancets USE TO CHECK BLOOD SUGARS ONCE DAILY. 100 each 12   albuterol (VENTOLIN HFA) 108 (90 Base) MCG/ACT inhaler Inhale 2 puffs into the lungs every 6 (six) hours as needed for wheezing or shortness of breath. 8 g 2   BD PEN NEEDLE NANO 2ND GEN 32G X 4 MM MISC USE AS DIRECTED TWICE DAILY 100 each 2   Blood Glucose Monitoring Suppl (ACCU-CHEK GUIDE) w/Device KIT USE TO CHECK BLOOD SUGARS ONCE DAILY. 1 kit 0   budesonide-formoterol (SYMBICORT) 160-4.5 MCG/ACT inhaler Inhale 2 puffs into the lungs 2 (two) times daily. 10.2 g 11   Cholecalciferol (VITAMIN D3) 2000 units TABS Take 1 tablet by mouth daily.     colchicine 0.6 MG tablet Take 1 tablet (0.6 mg total) by mouth as needed (for gout). 30 tablet 1   Continuous Glucose Sensor (DEXCOM G7 SENSOR) MISC Use daily to check blood sugars. Change every 10 days. 4 each 2   diltiazem (CARDIZEM CD) 180 MG 24 hr capsule Take 1 capsule (180 mg total) by mouth daily. 90 capsule 1   dorzolamide-timolol (COSOPT) 2-0.5 % ophthalmic solution Place 1 drop into the right eye 2 (two) times daily.     ELIQUIS 5 MG TABS tablet TAKE 1 TABLET(5 MG) BY MOUTH TWICE DAILY 180 tablet 1   empagliflozin (JARDIANCE) 10 MG TABS tablet Take 1 tablet (10 mg total) by mouth daily before breakfast. 30 tablet 5   Evolocumab (REPATHA SURECLICK) 140 MG/ML SOAJ Inject 140 mg into the skin every 14 (fourteen) days. 6 mL 3   fluticasone (FLONASE) 50 MCG/ACT nasal spray Place 2 sprays into both nostrils daily. 16 g 2   furosemide (LASIX) 20 MG tablet TAKE 1 TABLET(20 MG) BY MOUTH DAILY 30 tablet 10   glucose blood (ACCU-CHEK GUIDE) test strip USE TO  CHECK BLOOD SUGARS ONCE DAILY. 100 each 12   insulin aspart protamine - aspart (NOVOLOG MIX 70/30 FLEXPEN) (70-30) 100 UNIT/ML FlexPen INJECT 40 UNITS SUBCUTANEOUS EVERY MORNING AND 50 UNITS SUBCUTANEOUS AT DINNER 30 mL 2   isosorbide mononitrate (IMDUR) 60 MG 24 hr tablet Take 1 tablet (60 mg total) by mouth daily. 90 tablet 2   Latanoprostene Bunod (VYZULTA) 0.024 % SOLN Place 1 drop into the right eye at bedtime.     methocarbamol (ROBAXIN) 500 MG tablet Take 1 tablet (500 mg total) by mouth every 8 (eight) hours as needed for muscle spasms. 30 tablet 0   metoprolol succinate (TOPROL-XL) 100 MG 24 hr tablet Take 1.5 tablets (150 mg total) by mouth daily. Take with or immediately following a meal. 135 tablet 3   nitroGLYCERIN (NITROSTAT) 0.4 MG SL tablet Place 1 tablet (0.4 mg total) under the tongue every 5 (five) minutes as needed for chest pain. 25 tablet  11   olmesartan (BENICAR) 40 MG tablet TAKE 1 TABLET(40 MG) BY MOUTH DAILY 90 tablet 2   ONETOUCH VERIO test strip USE TO CHECK BLOOD SUGARS TWICE DAILY AS DIRECTED 100 strip 3   pantoprazole (PROTONIX) 40 MG tablet TAKE 1 TABLET(40 MG) BY MOUTH DAILY 90 tablet 1   polyethylene glycol (MIRALAX) 17 g packet Take 17 g by mouth daily. 30 each 0   potassium chloride (KLOR-CON) 10 MEQ tablet Take 1 tablet (10 mEq total) by mouth at bedtime.     Semaglutide,0.25 or 0.5MG /DOS, (OZEMPIC, 0.25 OR 0.5 MG/DOSE,) 2 MG/1.5ML SOPN INJECT 0.5MG  INTO SKIN ONCE A WEEK 4.5 mL 3   spironolactone (ALDACTONE) 25 MG tablet TAKE 1 TABLET(25 MG) BY MOUTH DAILY 90 tablet 1   traMADol (ULTRAM) 50 MG tablet Take 1 tablet (50 mg total) by mouth every 12 (twelve) hours as needed. 20 tablet 0   No facility-administered medications prior to visit.     Review of Systems:   Constitutional:   No  weight loss, night sweats,  Fevers, chills, +fatigue, or  lassitude.  HEENT:   No headaches,  Difficulty swallowing,  Tooth/dental problems, or  Sore throat,                No  sneezing, itching, ear ache, nasal congestion, post nasal drip,   CV:  No chest pain,  Orthopnea, PND, swelling in lower extremities, anasarca, dizziness, palpitations, syncope.   GI  No heartburn, indigestion, abdominal pain, nausea, vomiting, diarrhea, change in bowel habits, loss of appetite, bloody stools.   Resp:   No wheezing.  No chest wall deformity  Skin: no rash or lesions.  GU: no dysuria, change in color of urine, no urgency or frequency.  No flank pain, no hematuria   MS:  No joint pain or swelling.  No decreased range of motion.  No back pain.    Physical Exam  BP (!) 162/77 (BP Location: Left Arm, Patient Position: Sitting)   Pulse (!) 57   Wt 220 lb (99.8 kg)   SpO2 98%   BMI 37.18 kg/m   GEN: A/Ox3; pleasant , NAD, well nourished    HEENT:  [place]/AT,   NOSE-clear, THROAT-clear, no lesions, no postnasal drip or exudate noted.   NECK:  Supple w/ fair ROM; no JVD; normal carotid impulses w/o bruits; no thyromegaly or nodules palpated; no lymphadenopathy.    RESP  Clear  P & A; w/o, wheezes/ rales/ or rhonchi. no accessory muscle use, no dullness to percussion  CARD:  RRR, no m/r/g, 1+ peripheral edema, pulses intact, no cyanosis or clubbing.  GI:   Soft & nt; nml bowel sounds; no organomegaly or masses detected.   Musco: Warm bil, no deformities or joint swelling noted.   Neuro: alert, no focal deficits noted.    Skin: Warm, no lesions or rashes    Lab Results:       Imaging:   Administration History     None          Latest Ref Rng & Units 05/25/2023    2:11 PM  PFT Results  FVC-Pre L 1.35   FVC-Predicted Pre % 49   FVC-Post L 1.69   FVC-Predicted Post % 61   Pre FEV1/FVC % % 84   Post FEV1/FCV % % 83   FEV1-Pre L 1.13   FEV1-Predicted Pre % 55   FEV1-Post L 1.41   DLCO uncorrected ml/min/mmHg 13.65   DLCO UNC% % 70  DLCO corrected ml/min/mmHg 13.49   DLCO COR %Predicted % 70   DLVA Predicted % 109   TLC L 4.40   TLC %  Predicted % 85   RV % Predicted % 92     No results found for: NITRICOXIDE      Assessment & Plan:   No problem-specific Assessment & Plan notes found for this encounter.  Assessment and Plan Assessment & Plan Asthma -Moderate persistent . May have some overlap with COPD as strong history of smoking.   She experiences dyspnea, wheezing, and a productive cough, especially in the mornings. She previously used Symbicort but discontinued it due to cost and no perceived benefit Cardiac issues, such as congestive heart failure and atrial fibrillation, may exacerbate respiratory symptoms with dyspnea and activity intolerance.  Discussed restarting Symbicort to manage symptoms and improve lung capacity, considering cost and financial situation. Explored alternative inhalers like Trelegy and patient assistance programs for affordability. Restart Symbicort, two puffs twice daily, and rinse mouth after use. Use albuterol as needed for acute symptoms.  Lung nodule -small noted on CT chest in 08/2022. Strong smoking history. Further surveillance on subpleural reticulation for possible interstitial process.   D CHF -continue on current regimen.  Follow-up with cardiology appears euvolemic on exam.  History of A-fib.  Continue on current regimen.  Recent hospitalization for A-fib.  Patient has underlying sleep apnea CPAP intolerant.  We discussed potential complications of untreated sleep apnea.  Sleep apnea mild-unable to tolerate CPAP.  Patient is advised on positional sleep.  Does not want to restart CPAP at this time.  Plan Patient Instructions  Set up for CT chest  Restart Symbicort 2 puffs Twice daily, rinse after use.  Call back or send message if Symbicort cost is too much. We can look at alternative inhaler with patient assistance.  Albuterol inhaler As needed   Sleep with head of bed elevated at 30 degrees and avoid sleeping on your back.  Follow up in 3 months with Dr. Kassie and As  needed           I spent  45  minutes dedicated to the care of this patient on the date of this encounter to include pre-visit review of records, face-to-face time with the patient discussing conditions above, post visit ordering of testing, clinical documentation with the electronic health record, making appropriate referrals as documented, and communicating necessary findings to members of the patients care team.    Madelin Stank, NP 10/20/2023

## 2023-10-20 NOTE — Patient Instructions (Addendum)
 Set up for CT chest  Restart Symbicort 2 puffs Twice daily, rinse after use.  Call back or send message if Symbicort cost is too much. We can look at alternative inhaler with patient assistance.  Albuterol inhaler As needed   Sleep with head of bed elevated at 30 degrees and avoid sleeping on your back.  Follow up in 3 months with Dr. Kassie and As needed

## 2023-10-21 ENCOUNTER — Ambulatory Visit (HOSPITAL_COMMUNITY)
Admission: RE | Admit: 2023-10-21 | Discharge: 2023-10-21 | Disposition: A | Discharge status: Home / Self Care | Source: Ambulatory Visit | Attending: Internal Medicine | Admitting: Internal Medicine

## 2023-10-21 ENCOUNTER — Ambulatory Visit (INDEPENDENT_AMBULATORY_CARE_PROVIDER_SITE_OTHER)

## 2023-10-21 DIAGNOSIS — I739 Peripheral vascular disease, unspecified: Secondary | ICD-10-CM | POA: Diagnosis not present

## 2023-10-21 DIAGNOSIS — Z0000 Encounter for general adult medical examination without abnormal findings: Secondary | ICD-10-CM | POA: Diagnosis not present

## 2023-10-21 NOTE — Progress Notes (Signed)
 Subjective:   Dawn Wood is a 78 y.o. female who presents for Medicare Annual (Subsequent) preventive examination.  Visit Complete: Virtual I connected with  Dawn Wood on 10/21/23 by a audio enabled telemedicine application and verified that I am speaking with the correct person using two identifiers.  Patient Location: Home  Provider Location: Office/Clinic  I discussed the limitations of evaluation and management by telemedicine. The patient expressed understanding and agreed to proceed.  Vital Signs: Because this visit was a virtual/telehealth visit, some criteria may be missing or patient reported. Any vitals not documented were not able to be obtained and vitals that have been documented are patient reported.  Patient Medicare AWV questionnaire was completed by the patient on 10/21/2023; I have confirmed that all information answered by patient is correct and no changes since this date.        Objective:    There were no vitals filed for this visit. There is no height or weight on file to calculate BMI.     06/16/2023    9:32 AM 06/15/2023   11:54 PM 12/01/2022    8:00 AM 10/21/2022   11:32 AM 10/20/2021    3:36 PM 10/03/2019    9:23 AM 06/03/2016   12:19 PM  Advanced Directives  Does Patient Have a Medical Advance Directive? No No No No No No No   Would patient like information on creating a medical advance directive? No - Patient declined  No - Patient declined  Yes (MAU/Ambulatory/Procedural Areas - Information given) Yes (ED - Information included in AVS)      Data saved with a previous flowsheet row definition    Current Medications (verified) Outpatient Encounter Medications as of 10/21/2023  Medication Sig   Accu-Chek Softclix Lancets lancets USE TO CHECK BLOOD SUGARS ONCE DAILY.   albuterol (VENTOLIN HFA) 108 (90 Base) MCG/ACT inhaler Inhale 2 puffs into the lungs every 6 (six) hours as needed for wheezing or shortness of breath.   BD PEN  NEEDLE NANO 2ND GEN 32G X 4 MM MISC USE AS DIRECTED TWICE DAILY   Blood Glucose Monitoring Suppl (ACCU-CHEK GUIDE) w/Device KIT USE TO CHECK BLOOD SUGARS ONCE DAILY.   budesonide-formoterol (SYMBICORT) 160-4.5 MCG/ACT inhaler Inhale 2 puffs into the lungs 2 (two) times daily.   Cholecalciferol (VITAMIN D3) 2000 units TABS Take 1 tablet by mouth daily.   colchicine 0.6 MG tablet Take 1 tablet (0.6 mg total) by mouth as needed (for gout).   Continuous Glucose Sensor (DEXCOM G7 SENSOR) MISC Use daily to check blood sugars. Change every 10 days.   diltiazem (CARDIZEM CD) 180 MG 24 hr capsule Take 1 capsule (180 mg total) by mouth daily.   dorzolamide-timolol (COSOPT) 2-0.5 % ophthalmic solution Place 1 drop into the right eye 2 (two) times daily.   ELIQUIS 5 MG TABS tablet TAKE 1 TABLET(5 MG) BY MOUTH TWICE DAILY   empagliflozin (JARDIANCE) 10 MG TABS tablet Take 1 tablet (10 mg total) by mouth daily before breakfast.   Evolocumab (REPATHA SURECLICK) 140 MG/ML SOAJ Inject 140 mg into the skin every 14 (fourteen) days.   fluticasone (FLONASE) 50 MCG/ACT nasal spray Place 2 sprays into both nostrils daily.   furosemide (LASIX) 20 MG tablet TAKE 1 TABLET(20 MG) BY MOUTH DAILY   glucose blood (ACCU-CHEK GUIDE) test strip USE TO CHECK BLOOD SUGARS ONCE DAILY.   insulin aspart protamine - aspart (NOVOLOG MIX 70/30 FLEXPEN) (70-30) 100 UNIT/ML FlexPen INJECT 40 UNITS SUBCUTANEOUS EVERY MORNING AND  50 UNITS SUBCUTANEOUS AT DINNER   isosorbide mononitrate (IMDUR) 60 MG 24 hr tablet Take 1 tablet (60 mg total) by mouth daily.   Latanoprostene Bunod (VYZULTA) 0.024 % SOLN Place 1 drop into the right eye at bedtime.   methocarbamol (ROBAXIN) 500 MG tablet Take 1 tablet (500 mg total) by mouth every 8 (eight) hours as needed for muscle spasms.   metoprolol succinate (TOPROL-XL) 100 MG 24 hr tablet Take 1.5 tablets (150 mg total) by mouth daily. Take with or immediately following a meal.   nitroGLYCERIN (NITROSTAT)  0.4 MG SL tablet Place 1 tablet (0.4 mg total) under the tongue every 5 (five) minutes as needed for chest pain.   olmesartan (BENICAR) 40 MG tablet TAKE 1 TABLET(40 MG) BY MOUTH DAILY   ONETOUCH VERIO test strip USE TO CHECK BLOOD SUGARS TWICE DAILY AS DIRECTED   pantoprazole (PROTONIX) 40 MG tablet TAKE 1 TABLET(40 MG) BY MOUTH DAILY   polyethylene glycol (MIRALAX) 17 g packet Take 17 g by mouth daily.   potassium chloride (KLOR-CON) 10 MEQ tablet Take 1 tablet (10 mEq total) by mouth at bedtime.   Semaglutide,0.25 or 0.5MG /DOS, (OZEMPIC, 0.25 OR 0.5 MG/DOSE,) 2 MG/1.5ML SOPN INJECT 0.5MG  INTO SKIN ONCE A WEEK   spironolactone (ALDACTONE) 25 MG tablet TAKE 1 TABLET(25 MG) BY MOUTH DAILY   traMADol (ULTRAM) 50 MG tablet Take 1 tablet (50 mg total) by mouth every 12 (twelve) hours as needed.   No facility-administered encounter medications on file as of 10/21/2023.    Allergies (verified) Dust mite extract, Other, Statins, Allopurinol, and Pollen extract   History: Past Medical History:  Diagnosis Date   Asthma    Atrial fibrillation (HCC)    CKD (chronic kidney disease)    Coronary artery disease    Diabetes mellitus without complication (HCC)    GERD (gastroesophageal reflux disease)    Heart attack (HCC) 12/2022   Hypertension    MI (myocardial infarction) (HCC)    Peripheral vascular disease (HCC)    Sleep apnea    Past Surgical History:  Procedure Laterality Date   ABDOMINAL HYSTERECTOMY  1985   APPENDECTOMY     CATARACT EXTRACTION Left 02/20/2018   Dr. Cyrus   CATARACT EXTRACTION Bilateral 2024   cataract surgery Right 01/25/2022   Second at [facility] 11/29   CORONARY STENT INTERVENTION N/A 11/30/2022   Procedure: CORONARY STENT INTERVENTION;  Surgeon: Swaziland, Peter M, MD;  Location: [facility] INVASIVE CV LAB;  Service: Cardiovascular;  Laterality: N/A;  LAD, RCA   CORONARY STENT PLACEMENT  2015   CORONARY ULTRASOUND/IVUS N/A 11/30/2022   Procedure: Coronary Ultrasound/IVUS;   Surgeon: Swaziland, Peter M, MD;  Location[facility] INVASIVE CV LAB;  Service: Cardiovascular;  Laterality: N/A;  LAD, RCA   ectopic pregnancy--unilateral salpingectomy  1980   KNEE ARTHROSCOPY Left 03/2016   LEFT HEART CATH AND CORONARY ANGIOGRAPHY N/A 11/30/2022   Procedure: LEFT HEART CATH AND CORONARY ANGIOGRAPHY;  Surgeon: Swaziland, Peter M, MD;  Location: [facility] INVASIVE CV LAB;  Service: Cardiovascular;  Laterality: N/A;   unilateral oophorectomy     Family History  Problem Relation Age of Onset   Hypertension Mother    Hypertension Father    Throat cancer Father    Breast cancer Sister 16   Aortic stenosis Daughter    Heart disease Daughter        before age 15   Thyroid disease Daughter    Sleep apnea Neg Hx    Social History   Socioeconomic  History   Marital status: Single    Spouse name: Not on file   Number of children: 2   Years of education: Not on file   Highest education level: Not on file  Occupational History   Not on file  Tobacco Use   Smoking status: Former    Current packs/day: 0.00    Average packs/day: 0.3 packs/day for 45.0 years (11.3 ttl pk-yrs)    Types: Cigarettes    Start date: 01/11/1968    Quit date: 01/10/2013    Years since quitting: 10.7    Passive exposure: Never   Smokeless tobacco: Never   Tobacco comments:    Former smoker 06/24/23  Vaping Use   Vaping status: Never Used  Substance and Sexual Activity   Alcohol use: Not Currently    Alcohol/week: 0.0 standard drinks of alcohol   Drug use: No   Sexual activity: Not Currently  Other Topics Concern   Not on file  Social History Narrative   Not on file   Social Drivers of Health   Financial Resource Strain: Low Risk  (10/21/2022)   Overall Financial Resource Strain (CARDIA)    Difficulty of Paying Living Expenses: Not hard at all  Food Insecurity: No Food Insecurity (06/16/2023)   Hunger Vital Sign    Worried About Running Out of Food in the Last Year: Never true    Ran Out of Food in the  Last Year: Never true  Transportation Needs: No Transportation Needs (06/16/2023)   PRAPARE - Administrator, Civil Service (Medical): No    Lack of Transportation (Non-Medical): No  Physical Activity: Inactive (10/21/2022)   Exercise Vital Sign    Days of Exercise per Week: 0 days    Minutes of Exercise per Session: 0 min  Stress: No Stress Concern Present (10/21/2022)   [employer] of Occupational Health - Occupational Stress Questionnaire    Feeling of Stress : Not at all  Social Connections: Socially Isolated (06/16/2023)   Social Connection and Isolation Panel    Frequency of Communication with Friends and Family: More than three times a week    Frequency of Social Gatherings with Friends and Family: Twice a week    Attends Religious Services: Never    Database administrator or Organizations: No    Attends Banker Meetings: Never    Marital Status: Never married    Tobacco Counseling Counseling given: Not Answered Tobacco comments: Former smoker 06/24/23   Clinical Intake:                        Activities of Daily Living    06/16/2023    9:32 AM 12/01/2022    8:00 AM  In your present state of health, do you have any difficulty performing the following activities:  Hearing? 0 0  Vision? 0 0  Difficulty concentrating or making decisions? 0 0  Doing errands, shopping? 0 0    Patient Care Team: Jarold Medici, MD as PCP - General (Internal Medicine) Wendel Lurena POUR, MD as PCP - Cardiology (Cardiology) Jarold Medici, MD as Attending Physician (Internal Medicine) Cyrus Carwin, MD as Consulting Physician (Ophthalmology)  Indicate any recent Medical Services you may have received from other than [facility] providers in the past year (date may be approximate).     Assessment:   This is a routine wellness examination for Annistyn.  Hearing/Vision screen No results found.   Goals Addressed   None  Depression Screen     08/24/2023   10:57 AM 07/26/2023    2:31 PM 04/21/2023   11:26 AM 10/21/2022   11:34 AM 05/04/2022   10:51 AM 10/20/2021    2:54 PM 06/18/2021    2:56 PM  PHQ 2/9 Scores  PHQ - 2 Score 0 5 0 3 0 2 0  PHQ- 9 Score 0 11 0 9  8 0    Fall Risk    08/24/2023   10:57 AM 08/15/2023    1:11 PM 08/12/2023    1:00 PM 08/10/2023   12:00 PM 08/05/2023    1:01 PM  Fall Risk   Falls in the past year? 0 1 1 1 1   Number falls in past yr: 0 0 0 0 0  Injury with Fall? 0 1 1 1 1   Risk for fall due to : No Fall Risks History of fall(s);Impaired balance/gait History of fall(s);Impaired balance/gait History of fall(s);Impaired balance/gait History of fall(s);Impaired balance/gait  Follow up Falls evaluation completed Falls evaluation completed Falls evaluation completed Falls evaluation completed Falls evaluation completed    MEDICARE RISK AT HOME:    TIMED UP AND GO:  Was the test performed?  No    Cognitive Function:        10/21/2022   11:36 AM 10/20/2021    3:01 PM 10/04/2019   10:19 AM  6CIT Screen  What Year? 0 points 0 points 0 points  What month? 0 points 0 points 0 points  What time? 0 points 0 points 0 points  Count back from 20 0 points 0 points 0 points  Months in reverse 0 points 0 points 0 points  Repeat phrase 2 points 2 points 0 points  Total Score 2 points 2 points 0 points    Immunizations Immunization History  Administered Date(s) Administered   Fluad Quad(high Dose 65+) 04/06/2021, 12/30/2021   Fluad Trivalent(High Dose 65+) 04/21/2023   Janssen (J&J) SARS-COV-2 Vaccination 05/25/2019   PFIZER(Purple Top)SARS-COV-2 Vaccination 02/21/2020   Tdap 11/04/2017    TDAP status: Up to date  Flu Vaccine status: Up to date  Pneumococcal vaccine status: Declined,  Education has been provided regarding the importance of this vaccine but patient still declined. Advised may receive this vaccine at local pharmacy or Health Dept. Aware to provide a copy of the vaccination record if  obtained from local pharmacy or Health Dept. Verbalized acceptance and understanding.   Covid-19 vaccine status: Declined, Education has been provided regarding the importance of this vaccine but patient still declined. Advised may receive this vaccine at local pharmacy or Health Dept.or vaccine clinic. Aware to provide a copy of the vaccination record if obtained from local pharmacy or Health Dept. Verbalized acceptance and understanding.  Qualifies for Shingles Vaccine? Yes   Zostavax completed No   Shingrix Completed?: No.    Education has been provided regarding the importance of this vaccine. Patient has been advised to call insurance company to determine out of pocket expense if they have not yet received this vaccine. Advised may also receive vaccine at local pharmacy or Health Dept. Verbalized acceptance and understanding.  Screening Tests Health Maintenance  Topic Date Due   OPHTHALMOLOGY EXAM  05/22/2022   FOOT EXAM  10/21/2022   COVID-19 Vaccine (3 - 2024-25 season) 11/07/2022   Medicare Annual Wellness (AWV)  10/21/2023   INFLUENZA VACCINE  10/07/2023   Zoster Vaccines- Shingrix (1 of 2) 11/24/2023 (Originally 10/18/1964)   Pneumococcal Vaccine: 50+ Years (1 of 2 - PCV)  06/22/2024 (Originally 10/18/1964)   HEMOGLOBIN A1C  02/23/2024   Diabetic kidney evaluation - Urine ACR  06/22/2024   Diabetic kidney evaluation - eGFR measurement  08/23/2024   DTaP/Tdap/Td (2 - Td or Tdap) 11/05/2027   DEXA SCAN  Completed   Hepatitis C Screening  Completed   HPV VACCINES  Aged Out   Meningococcal B Vaccine  Aged Out   Colonoscopy  Discontinued    Health Maintenance  Health Maintenance Due  Topic Date Due   OPHTHALMOLOGY EXAM  05/22/2022   FOOT EXAM  10/21/2022   COVID-19 Vaccine (3 - 2024-25 season) 11/07/2022   Medicare Annual Wellness (AWV)  10/21/2023   INFLUENZA VACCINE  10/07/2023    Colorectal cancer screening: Type of screening: Colonoscopy. Completed 2020. Repeat every 10  years    Bone Density status: Completed 2018. Results reflect: Bone density results: NORMAL. Repeat every 0 years.  Lung Cancer Screening: (Low Dose CT Chest recommended if Age 88-80 years, 20 pack-year currently smoking OR have quit w/in 15years.) does not qualify.   Lung Cancer Screening Referral: n/a  Additional Screening:  Hepatitis C Screening: does qualify; Completed 04/05/2019  Vision Screening: Recommended annual ophthalmology exams for early detection of glaucoma and other disorders of the eye. Is the patient up to date with their annual eye exam?  Yes  Who is the provider or what is the name of the office in which the patient attends annual eye exams? Dr.Laous If pt is not established with a provider, would they like to be referred to a provider to establish care? Yes .   Dental Screening: Recommended annual dental exams for proper oral hygiene  Diabetic Foot Exam: Diabetic Foot Exam: Overdue, Pt has been advised about the importance in completing this exam. Pt is scheduled for diabetic foot exam on 01/01/2024.  Community Resource Referral / Chronic Care Management: CRR required this visit?  No   CCM required this visit?  No     Plan:     I have personally reviewed and noted the following in the patient's chart:   Medical and social history Use of alcohol, tobacco or illicit drugs  Current medications and supplements including opioid prescriptions. Patient is not currently taking opioid prescriptions. Functional ability and status Nutritional status Physical activity Advanced directives List of other physicians Hospitalizations, surgeries, and ER visits in previous 12 months Vitals Screenings to include cognitive, depression, and falls Referrals and appointments  In addition, I have reviewed and discussed with patient certain preventive protocols, quality metrics, and best practice recommendations. A written personalized care plan for preventive services as  well as general preventive health recommendations were provided to patient.     Kristeen JINNY Lunger, CMA   10/21/2023   After Visit Summary: (MyChart) Due to this being a telephonic visit, the after visit summary with patients personalized plan was offered to patient via MyChart   Nurse Notes:

## 2023-11-08 ENCOUNTER — Ambulatory Visit: Admitting: Internal Medicine

## 2023-11-08 ENCOUNTER — Ambulatory Visit: Payer: Self-pay | Admitting: Internal Medicine

## 2023-11-08 ENCOUNTER — Encounter: Payer: Self-pay | Admitting: Internal Medicine

## 2023-11-08 VITALS — BP 122/80 | HR 53 | Temp 98.1°F | Ht 64.0 in | Wt 223.8 lb

## 2023-11-08 DIAGNOSIS — E1122 Type 2 diabetes mellitus with diabetic chronic kidney disease: Secondary | ICD-10-CM

## 2023-11-08 DIAGNOSIS — M5416 Radiculopathy, lumbar region: Secondary | ICD-10-CM | POA: Diagnosis not present

## 2023-11-08 DIAGNOSIS — N1831 Chronic kidney disease, stage 3a: Secondary | ICD-10-CM | POA: Diagnosis not present

## 2023-11-08 DIAGNOSIS — Z794 Long term (current) use of insulin: Secondary | ICD-10-CM

## 2023-11-08 DIAGNOSIS — Z6838 Body mass index (BMI) 38.0-38.9, adult: Secondary | ICD-10-CM | POA: Diagnosis not present

## 2023-11-08 DIAGNOSIS — N3001 Acute cystitis with hematuria: Secondary | ICD-10-CM

## 2023-11-08 DIAGNOSIS — E66812 Obesity, class 2: Secondary | ICD-10-CM

## 2023-11-08 LAB — POCT URINALYSIS DIP (CLINITEK)
Bilirubin, UA: NEGATIVE
Glucose, UA: 500 mg/dL — AB
Ketones, POC UA: NEGATIVE mg/dL
Nitrite, UA: NEGATIVE
POC PROTEIN,UA: 100 — AB
Spec Grav, UA: 1.01 (ref 1.010–1.025)
Urobilinogen, UA: 0.2 U/dL
pH, UA: 6 (ref 5.0–8.0)

## 2023-11-08 MED ORDER — CEPHALEXIN 500 MG PO CAPS: 500.0000 mg | ORAL_CAPSULE | Freq: Two times a day (BID) | ORAL | 0 refills | Status: AC

## 2023-11-08 NOTE — Assessment & Plan Note (Signed)
 Blood sugar generally well-controlled; orange juice may elevate blood sugar and worsen urinary symptoms. - Advise to stop drinking orange juice. - Schedule diabetes check for October. - Encouraged to use Dexcom sensors, one was applied during her visit.

## 2023-11-08 NOTE — Progress Notes (Signed)
 I,Victoria T Emmitt, CMA,acting as a Neurosurgeon for Catheryn LOISE Slocumb, MD.,have documented all relevant documentation on the behalf of Catheryn LOISE Slocumb, MD,as directed by  Catheryn LOISE Slocumb, MD while in the presence of Catheryn LOISE Slocumb, MD.  Subjective:  Patient ID: Dawn Wood , female    DOB: 01-29-1946 , 78 y.o.   MRN: [id number]  Chief Complaint  Patient presents with   Urinary Concern    Patient presents today complaining of pain in her groin area. She also has noticed blood in her urine with the urgency to urinate. This initially started a month ago. The symptoms recently have gotten worse. Soon after starting Jardiance  these symptoms came about.      HPI Discussed the use of AI scribe software for clinical note transcription with the patient, who gave verbal consent to proceed.  History of Present Illness Dawn Wood is a 78 year old female who presents with urinary frequency and dysuria.  She experiences increased urinary frequency, including nocturia, and a burning sensation during urination. Hematuria is also present. She attributes these symptoms to her medication, specifically Jardiance , as her symptoms began after starting that pill.  This was prescribed by Cardiology.   Her blood sugar levels have been relatively stable, with the highest reading being 130 mg/dL. She mentions consuming orange juice, which may be affecting her blood sugar levels. She is currently using a Dexcom device for glucose monitoring but has difficulty applying it herself and requires assistance.  She reports ongoing back pain. Denies recent fall.  Denies recent visit to Ortho. Not sure what triggered her pain.     Past Medical History:  Diagnosis Date   Asthma    Atrial fibrillation (HCC)    CKD (chronic kidney disease)    Coronary artery disease    Diabetes mellitus without complication (HCC)    GERD (gastroesophageal reflux disease)    Heart attack (HCC) 12/2022   Hypertension     MI (myocardial infarction) (HCC)    Peripheral vascular disease (HCC)    Sleep apnea      Family History  Problem Relation Age of Onset   Hypertension Mother    Hypertension Father    Throat cancer Father    Breast cancer Sister 14   Aortic stenosis Daughter    Heart disease Daughter        before age 75   Thyroid  disease Daughter    Sleep apnea Neg Hx      Current Outpatient Medications:    cephALEXin  (KEFLEX ) 500 MG capsule, Take 1 capsule (500 mg total) by mouth 2 (two) times daily for 10 days., Disp: 14 capsule, Rfl: 0   Accu-Chek Softclix Lancets lancets, USE TO CHECK BLOOD SUGARS ONCE DAILY., Disp: 100 each, Rfl: 12   albuterol  (VENTOLIN  HFA) 108 (90 Base) MCG/ACT inhaler, Inhale 2 puffs into the lungs every 6 (six) hours as needed for wheezing or shortness of breath., Disp: 8 g, Rfl: 2   BD PEN NEEDLE NANO 2ND GEN 32G X 4 MM MISC, USE AS DIRECTED TWICE DAILY, Disp: 100 each, Rfl: 2   Blood Glucose Monitoring Suppl (ACCU-CHEK GUIDE) w/Device KIT, USE TO CHECK BLOOD SUGARS ONCE DAILY., Disp: 1 kit, Rfl: 0   budesonide -formoterol  (SYMBICORT ) 160-4.5 MCG/ACT inhaler, Inhale 2 puffs into the lungs 2 (two) times daily., Disp: 10.2 g, Rfl: 11   Cholecalciferol (VITAMIN D3) 2000 units TABS, Take 1 tablet by mouth daily., Disp: , Rfl:    colchicine  0.6 MG tablet,  Take 1 tablet (0.6 mg total) by mouth as needed (for gout)., Disp: 30 tablet, Rfl: 1   Continuous Glucose Sensor (DEXCOM G7 SENSOR) MISC, Use daily to check blood sugars. Change every 10 days., Disp: 4 each, Rfl: 2   diltiazem  (CARDIZEM  CD) 180 MG 24 hr capsule, Take 1 capsule (180 mg total) by mouth daily., Disp: 90 capsule, Rfl: 1   dorzolamide -timolol  (COSOPT ) 2-0.5 % ophthalmic solution, Place 1 drop into the right eye 2 (two) times daily., Disp: , Rfl:    ELIQUIS  5 MG TABS tablet, TAKE 1 TABLET(5 MG) BY MOUTH TWICE DAILY, Disp: 180 tablet, Rfl: 1   empagliflozin  (JARDIANCE ) 10 MG TABS tablet, Take 1 tablet (10 mg total) by  mouth daily before breakfast., Disp: 30 tablet, Rfl: 5   Evolocumab  (REPATHA  SURECLICK) 140 MG/ML SOAJ, Inject 140 mg into the skin every 14 (fourteen) days., Disp: 6 mL, Rfl: 3   fluticasone  (FLONASE ) 50 MCG/ACT nasal spray, Place 2 sprays into both nostrils daily., Disp: 16 g, Rfl: 2   furosemide  (LASIX ) 20 MG tablet, TAKE 1 TABLET(20 MG) BY MOUTH DAILY, Disp: 30 tablet, Rfl: 10   glucose blood (ACCU-CHEK GUIDE) test strip, USE TO CHECK BLOOD SUGARS ONCE DAILY., Disp: 100 each, Rfl: 12   insulin  aspart protamine - aspart (NOVOLOG  MIX 70/30 FLEXPEN) (70-30) 100 UNIT/ML FlexPen, INJECT 40 UNITS SUBCUTANEOUS EVERY MORNING AND 50 UNITS SUBCUTANEOUS AT DINNER, Disp: 30 mL, Rfl: 2   isosorbide  mononitrate (IMDUR ) 60 MG 24 hr tablet, Take 1 tablet (60 mg total) by mouth daily., Disp: 90 tablet, Rfl: 2   Latanoprostene Bunod  (VYZULTA ) 0.024 % SOLN, Place 1 drop into the right eye at bedtime., Disp: , Rfl:    methocarbamol  (ROBAXIN ) 500 MG tablet, Take 1 tablet (500 mg total) by mouth every 8 (eight) hours as needed for muscle spasms., Disp: 30 tablet, Rfl: 0   metoprolol  succinate (TOPROL -XL) 100 MG 24 hr tablet, Take 1.5 tablets (150 mg total) by mouth daily. Take with or immediately following a meal., Disp: 135 tablet, Rfl: 3   nitroGLYCERIN  (NITROSTAT ) 0.4 MG SL tablet, Place 1 tablet (0.4 mg total) under the tongue every 5 (five) minutes as needed for chest pain., Disp: 25 tablet, Rfl: 11   olmesartan  (BENICAR ) 40 MG tablet, TAKE 1 TABLET(40 MG) BY MOUTH DAILY, Disp: 90 tablet, Rfl: 2   ONETOUCH VERIO test strip, USE TO CHECK BLOOD SUGARS TWICE DAILY AS DIRECTED, Disp: 100 strip, Rfl: 3   pantoprazole  (PROTONIX ) 40 MG tablet, TAKE 1 TABLET(40 MG) BY MOUTH DAILY, Disp: 90 tablet, Rfl: 1   polyethylene glycol (MIRALAX ) 17 g packet, Take 17 g by mouth daily., Disp: 30 each, Rfl: 0   potassium chloride  (KLOR-CON ) 10 MEQ tablet, Take 1 tablet (10 mEq total) by mouth at bedtime., Disp: , Rfl:     Semaglutide ,0.25 or 0.5MG /DOS, (OZEMPIC , 0.25 OR 0.5 MG/DOSE,) 2 MG/1.5ML SOPN, INJECT 0.5MG  INTO SKIN ONCE A WEEK, Disp: 4.5 mL, Rfl: 3   spironolactone  (ALDACTONE ) 25 MG tablet, TAKE 1 TABLET(25 MG) BY MOUTH DAILY, Disp: 90 tablet, Rfl: 1   traMADol  (ULTRAM ) 50 MG tablet, Take 1 tablet (50 mg total) by mouth every 12 (twelve) hours as needed., Disp: 20 tablet, Rfl: 0   Allergies  Allergen Reactions   Dust Mite Extract Other (See Comments)    REACTION: SNEEZING   Other Other (See Comments)    ALLERGEN: DEODORIZERS SPRAY AND PERFUMES REACTION: SNEEZING   Statins Other (See Comments)    Muscle aches with  rosuvastatin  5 mg daily and 3x/weekly, atorvastatin  20 mg daily and 3x/week, simvastatin 40 mg daily    Allopurinol      Diarrhea and the pt said it made her sugars low   Pollen Extract Other (See Comments)    Reaction unknown     Review of Systems  Constitutional: Negative.   Respiratory: Negative.    Cardiovascular: Negative.   Gastrointestinal: Negative.   Genitourinary:  Positive for dysuria and frequency.       No vaginal d/c  Neurological: Negative.   Psychiatric/Behavioral: Negative.       Today's Vitals   11/08/23 1214  BP: 122/80  Pulse: (!) 53  Temp: 98.1 F (36.7 C)  SpO2: 98%  Weight: 223 lb 12.8 oz (101.5 kg)  Height: 5' 4 (1.626 m)   Body mass index is 38.42 kg/m.  Wt Readings from Last 3 Encounters:  11/08/23 223 lb 12.8 oz (101.5 kg)  10/20/23 220 lb (99.8 kg)  10/03/23 220 lb (99.8 kg)     Objective:  Physical Exam Vitals and nursing note reviewed.  Constitutional:      Appearance: Normal appearance.  HENT:     Head: Normocephalic and atraumatic.  Eyes:     Extraocular Movements: Extraocular movements intact.  Cardiovascular:     Rate and Rhythm: Regular rhythm. Bradycardia present.     Heart sounds: Normal heart sounds.  Pulmonary:     Effort: Pulmonary effort is normal.     Breath sounds: Normal breath sounds.  Abdominal:      Palpations: Abdomen is soft.     Comments: Suprapubic pressure  Musculoskeletal:     Cervical back: Normal range of motion.  Skin:    General: Skin is warm.  Neurological:     General: No focal deficit present.     Mental Status: She is alert.  Psychiatric:        Mood and Affect: Mood normal.        Behavior: Behavior normal.         Assessment And Plan:  Acute cystitis with hematuria Assessment & Plan: Urinary symptoms suggest possible drug-induced UTI due to Jardiance . - Send urine culture. - Will send rx Keflex  500mg  to take twice daily - Adjust rx as per urine culture results  Orders: -     POCT URINALYSIS DIP (CLINITEK) -     Urine Culture  Type 2 diabetes mellitus with stage 3a chronic kidney disease, with long-term current use of insulin  (HCC) Assessment & Plan: Blood sugar generally well-controlled; orange juice may elevate blood sugar and worsen urinary symptoms. - Advise to stop drinking orange juice. - Schedule diabetes check for October. - Encouraged to use Dexcom sensors, one was applied during her visit.   Lumbar radiculopathy Assessment & Plan: She would likely benefit from physical therapy.  - Will refer her to Ortho for further evaluation   Class 2 severe obesity due to excess calories with serious comorbidity and body mass index (BMI) of 38.0 to 38.9 in adult [facility]) Assessment & Plan: She is encouraged to strive for BMI less than 30 to decrease cardiac risk. Advised to aim for at least 150 minutes of exercise per week. She would likely benefit from the PREP program.     Other orders -     Cephalexin ; Take 1 capsule (500 mg total) by mouth 2 (two) times daily for 10 days.  Dispense: 14 capsule; Refill: 0    Return if symptoms worsen or fail to improve.  Patient  was given opportunity to ask questions. Patient verbalized understanding of the plan and was able to repeat key elements of the plan. All questions were answered to their satisfaction.    I, Catheryn LOISE Slocumb, MD, have reviewed all documentation for this visit. The documentation on 11/08/23 for the exam, diagnosis, procedures, and orders are all accurate and complete.   IF YOU HAVE BEEN REFERRED TO A SPECIALIST, IT MAY TAKE 1-2 WEEKS TO SCHEDULE/PROCESS THE REFERRAL. IF YOU HAVE NOT HEARD FROM US /SPECIALIST IN TWO WEEKS, PLEASE GIVE US  A CALL AT [phone].   THE PATIENT IS ENCOURAGED TO PRACTICE SOCIAL DISTANCING DUE TO THE COVID-19 PANDEMIC.

## 2023-11-08 NOTE — Assessment & Plan Note (Signed)
 She would likely benefit from physical therapy.  - Will refer her to Ortho for further evaluation

## 2023-11-08 NOTE — Patient Instructions (Signed)

## 2023-11-08 NOTE — Assessment & Plan Note (Signed)
 She is encouraged to strive for BMI less than 30 to decrease cardiac risk. Advised to aim for at least 150 minutes of exercise per week. She would likely benefit from the PREP program.

## 2023-11-08 NOTE — Assessment & Plan Note (Signed)
 Urinary symptoms suggest possible drug-induced UTI due to Jardiance . - Send urine culture. - Will send rx Keflex  500mg  to take twice daily - Adjust rx as per urine culture results

## 2023-11-11 LAB — URINE CULTURE

## 2023-11-14 DIAGNOSIS — N1831 Chronic kidney disease, stage 3a: Secondary | ICD-10-CM | POA: Diagnosis not present

## 2023-11-14 DIAGNOSIS — N390 Urinary tract infection, site not specified: Secondary | ICD-10-CM | POA: Diagnosis not present

## 2023-11-16 ENCOUNTER — Encounter: Payer: Self-pay | Admitting: Vascular Surgery

## 2023-11-16 ENCOUNTER — Ambulatory Visit (HOSPITAL_COMMUNITY)
Admission: RE | Admit: 2023-11-16 | Discharge: 2023-11-16 | Disposition: A | Discharge status: Home / Self Care | Source: Ambulatory Visit | Attending: Vascular Surgery | Admitting: Vascular Surgery

## 2023-11-16 ENCOUNTER — Ambulatory Visit (INDEPENDENT_AMBULATORY_CARE_PROVIDER_SITE_OTHER): Admitting: Vascular Surgery

## 2023-11-16 VITALS — BP 164/79 | HR 49 | Temp 97.9°F | Ht 64.0 in | Wt 222.0 lb

## 2023-11-16 DIAGNOSIS — M79606 Pain in leg, unspecified: Secondary | ICD-10-CM | POA: Diagnosis not present

## 2023-11-16 DIAGNOSIS — I70212 Atherosclerosis of native arteries of extremities with intermittent claudication, left leg: Secondary | ICD-10-CM | POA: Diagnosis not present

## 2023-11-16 LAB — VAS US ABI WITH/WO TBI
Left ABI: 0.69
Right ABI: 0.88

## 2023-11-16 NOTE — Progress Notes (Signed)
 Patient ID: Dawn Wood, female   DOB: 08/13/1945, 78 y.o.   MRN: [id number]  Reason for Consult: New Patient (Initial Visit)   Referred by Wendel Lurena POUR, MD  Subjective:     HPI:  Dawn Wood is a 78 y.o. female with history of diabetes, hypertension, coronary artery disease and chronic kidney disease now presents with concern for peripheral arterial disease.  She states that she has significant back pain which radiates down both legs.  She does have left lower extremity greater than right lower extremity pain but denies any tissue loss or ulceration.  No personal or family history of aortic aneurysms in the past.  She denies any previous vascular intervention.  She has previously been evaluated here for similar symptoms.  She states that she is unable to walk significant distances due to back pain.  She is recently retired from housekeeping at [employer] states that she has gained weight and did not want sedentary since retirement.  Past Medical History:  Diagnosis Date   Asthma    Atrial fibrillation (HCC)    CKD (chronic kidney disease)    Coronary artery disease    Diabetes mellitus without complication (HCC)    GERD (gastroesophageal reflux disease)    Heart attack (HCC) 12/2022   Hypertension    MI (myocardial infarction) (HCC)    Peripheral vascular disease (HCC)    Sleep apnea    Family History  Problem Relation Age of Onset   Hypertension Mother    Hypertension Father    Throat cancer Father    Breast cancer Sister 11   Aortic stenosis Daughter    Heart disease Daughter        before age 84   Thyroid  disease Daughter    Sleep apnea Neg Hx    Past Surgical History:  Procedure Laterality Date   ABDOMINAL HYSTERECTOMY  1985   APPENDECTOMY     CATARACT EXTRACTION Left 02/20/2018   Dr. Cyrus   CATARACT EXTRACTION Bilateral 2024   cataract surgery Right 01/25/2022   Second at [facility] 11/29   CORONARY STENT INTERVENTION N/A  11/30/2022   Procedure: CORONARY STENT INTERVENTION;  Surgeon: Swaziland, Peter M, MD;  Location: [facility] INVASIVE CV LAB;  Service: Cardiovascular;  Laterality: N/A;  LAD, RCA   CORONARY STENT PLACEMENT  2015   CORONARY ULTRASOUND/IVUS N/A 11/30/2022   Procedure: Coronary Ultrasound/IVUS;  Surgeon: Swaziland, Peter M, MD;  Location: [facility] INVASIVE CV LAB;  Service: Cardiovascular;  Laterality: N/A;  LAD, RCA   ectopic pregnancy--unilateral salpingectomy  1980   KNEE ARTHROSCOPY Left 03/2016   LEFT HEART CATH AND CORONARY ANGIOGRAPHY N/A 11/30/2022   Procedure: LEFT HEART CATH AND CORONARY ANGIOGRAPHY;  Surgeon: Swaziland, Peter M, MD;  Location: [facility] INVASIVE CV LAB;  Service: Cardiovascular;  Laterality: N/A;   unilateral oophorectomy      Short Social History:  Social History   Tobacco Use   Smoking status: Former    Current packs/day: 0.00    Average packs/day: 0.3 packs/day for 45.0 years (11.3 ttl pk-yrs)    Types: Cigarettes    Start date: 01/11/1968    Quit date: 01/10/2013    Years since quitting: 10.8    Passive exposure: Never   Smokeless tobacco: Never   Tobacco comments:    Former smoker 06/24/23  Substance Use Topics   Alcohol use: Not Currently    Alcohol/week: 0.0 standard drinks of alcohol    Allergies  Allergen Reactions   Dust  Mite Extract Other (See Comments)    REACTION: SNEEZING   Other Other (See Comments)    ALLERGEN: DEODORIZERS SPRAY AND PERFUMES REACTION: SNEEZING   Statins Other (See Comments)    Muscle aches with rosuvastatin  5 mg daily and 3x/weekly, atorvastatin  20 mg daily and 3x/week, simvastatin 40 mg daily    Allopurinol      Diarrhea and the pt said it made her sugars low   Pollen Extract Other (See Comments)    Reaction unknown    Current Outpatient Medications  Medication Sig Dispense Refill   Accu-Chek Softclix Lancets lancets USE TO CHECK BLOOD SUGARS ONCE DAILY. 100 each 12   albuterol  (VENTOLIN  HFA) 108 (90 Base) MCG/ACT inhaler Inhale 2 puffs into  the lungs every 6 (six) hours as needed for wheezing or shortness of breath. 8 g 2   BD PEN NEEDLE NANO 2ND GEN 32G X 4 MM MISC USE AS DIRECTED TWICE DAILY 100 each 2   Blood Glucose Monitoring Suppl (ACCU-CHEK GUIDE) w/Device KIT USE TO CHECK BLOOD SUGARS ONCE DAILY. 1 kit 0   budesonide -formoterol  (SYMBICORT ) 160-4.5 MCG/ACT inhaler Inhale 2 puffs into the lungs 2 (two) times daily. 10.2 g 11   cephALEXin  (KEFLEX ) 500 MG capsule Take 1 capsule (500 mg total) by mouth 2 (two) times daily for 10 days. 14 capsule 0   Cholecalciferol (VITAMIN D3) 2000 units TABS Take 1 tablet by mouth daily.     colchicine  0.6 MG tablet Take 1 tablet (0.6 mg total) by mouth as needed (for gout). 30 tablet 1   Continuous Glucose Sensor (DEXCOM G7 SENSOR) MISC Use daily to check blood sugars. Change every 10 days. 4 each 2   diltiazem  (CARDIZEM  CD) 180 MG 24 hr capsule Take 1 capsule (180 mg total) by mouth daily. 90 capsule 1   dorzolamide -timolol  (COSOPT ) 2-0.5 % ophthalmic solution Place 1 drop into the right eye 2 (two) times daily.     ELIQUIS  5 MG TABS tablet TAKE 1 TABLET(5 MG) BY MOUTH TWICE DAILY 180 tablet 1   empagliflozin  (JARDIANCE ) 10 MG TABS tablet Take 1 tablet (10 mg total) by mouth daily before breakfast. 30 tablet 5   Evolocumab  (REPATHA  SURECLICK) 140 MG/ML SOAJ Inject 140 mg into the skin every 14 (fourteen) days. 6 mL 3   fluticasone  (FLONASE ) 50 MCG/ACT nasal spray Place 2 sprays into both nostrils daily. 16 g 2   furosemide  (LASIX ) 20 MG tablet TAKE 1 TABLET(20 MG) BY MOUTH DAILY 30 tablet 10   glucose blood (ACCU-CHEK GUIDE) test strip USE TO CHECK BLOOD SUGARS ONCE DAILY. 100 each 12   insulin  aspart protamine - aspart (NOVOLOG  MIX 70/30 FLEXPEN) (70-30) 100 UNIT/ML FlexPen INJECT 40 UNITS SUBCUTANEOUS EVERY MORNING AND 50 UNITS SUBCUTANEOUS AT DINNER 30 mL 2   isosorbide  mononitrate (IMDUR ) 60 MG 24 hr tablet Take 1 tablet (60 mg total) by mouth daily. 90 tablet 2   Latanoprostene Bunod   (VYZULTA ) 0.024 % SOLN Place 1 drop into the right eye at bedtime.     methocarbamol  (ROBAXIN ) 500 MG tablet Take 1 tablet (500 mg total) by mouth every 8 (eight) hours as needed for muscle spasms. 30 tablet 0   metoprolol  succinate (TOPROL -XL) 100 MG 24 hr tablet Take 1.5 tablets (150 mg total) by mouth daily. Take with or immediately following a meal. 135 tablet 3   nitroGLYCERIN  (NITROSTAT ) 0.4 MG SL tablet Place 1 tablet (0.4 mg total) under the tongue every 5 (five) minutes as needed for chest pain. 25  tablet 11   olmesartan  (BENICAR ) 40 MG tablet TAKE 1 TABLET(40 MG) BY MOUTH DAILY 90 tablet 2   ONETOUCH VERIO test strip USE TO CHECK BLOOD SUGARS TWICE DAILY AS DIRECTED 100 strip 3   pantoprazole  (PROTONIX ) 40 MG tablet TAKE 1 TABLET(40 MG) BY MOUTH DAILY 90 tablet 1   polyethylene glycol (MIRALAX ) 17 g packet Take 17 g by mouth daily. 30 each 0   potassium chloride  (KLOR-CON ) 10 MEQ tablet Take 1 tablet (10 mEq total) by mouth at bedtime.     Semaglutide ,0.25 or 0.5MG /DOS, (OZEMPIC , 0.25 OR 0.5 MG/DOSE,) 2 MG/1.5ML SOPN INJECT 0.5MG  INTO SKIN ONCE A WEEK 4.5 mL 3   spironolactone  (ALDACTONE ) 25 MG tablet TAKE 1 TABLET(25 MG) BY MOUTH DAILY 90 tablet 1   traMADol  (ULTRAM ) 50 MG tablet Take 1 tablet (50 mg total) by mouth every 12 (twelve) hours as needed. 20 tablet 0   No current facility-administered medications for this visit.    Review of Systems  Constitutional:  Constitutional negative. HENT: HENT negative.  Eyes: Eyes negative.  Cardiovascular: Positive for leg swelling.  GI: Gastrointestinal negative.  Musculoskeletal: Positive for back pain, gait problem and leg pain.  Hematologic: Hematologic/lymphatic negative.  Psychiatric: Psychiatric negative.        Objective:  Objective   Vitals:   11/16/23 1353  BP: (!) 164/79  Pulse: (!) 49  Temp: 97.9 F (36.6 C)  SpO2: 95%  Weight: 222 lb (100.7 kg)  Height: 5' 4 (1.626 m)   Body mass index is 38.11  kg/m.  Physical Exam Eyes:     Pupils: Pupils are equal, round, and reactive to light.  Cardiovascular:     Rate and Rhythm: Normal rate.     Pulses:          Dorsalis pedis pulses are 1+ on the right side and 0 on the left side.  Pulmonary:     Effort: Pulmonary effort is normal.  Abdominal:     General: Abdomen is flat.  Musculoskeletal:        General: Normal range of motion.     Right lower leg: Edema present.     Left lower leg: Edema present.  Skin:    General: Skin is warm.     Capillary Refill: Capillary refill takes less than 2 seconds.  Neurological:     General: No focal deficit present.     Mental Status: She is alert.  Psychiatric:        Mood and Affect: Mood normal.        Thought Content: Thought content normal.     Data: ABI Findings:  +---------+------------------+-----+--------+--------+  Right   Rt Pressure (mmHg)IndexWaveformComment   +---------+------------------+-----+--------+--------+  Brachial 176                                      +---------+------------------+-----+--------+--------+  PTA     154               0.88 biphasic          +---------+------------------+-----+--------+--------+  DP      140               0.80 biphasic          +---------+------------------+-----+--------+--------+  Great Toe128               0.73                   +---------+------------------+-----+--------+--------+   +---------+------------------+-----+----------+-------+  Left    Lt Pressure (mmHg)IndexWaveform  Comment  +---------+------------------+-----+----------+-------+  PTA     122               0.69 monophasic         +---------+------------------+-----+----------+-------+  DP      113               0.64 monophasic         +---------+------------------+-----+----------+-------+  Great Toe97                0.55                    +---------+------------------+-----+----------+-------+    +-------+-----------+-----------+------------+------------+  ABI/TBIToday's ABIToday's TBIPrevious ABIPrevious TBI  +-------+-----------+-----------+------------+------------+  Right 0.88       0.73       0.96        0.99          +-------+-----------+-----------+------------+------------+  Left  0.69       0.55       0.76        0.50          +-------+-----------+-----------+------------+------------+      Bilateral ABIs appear decreased compared to prior study on 05/24/2023.    Summary:  Right: Resting right ankle-brachial index indicates mild right lower  extremity arterial disease.  Right toe pressure is >60 mmHg which suggests adequate perfusion for  healing.    Left: Resting left ankle-brachial index indicates moderate left lower  extremity arterial disease.  Left toe pressure is >60 mmHg which suggests adequate perfusion for  healing.      Assessment/Plan:     78 year old female with multifactorial bilateral lower extremity pain left greater than right with mildly depressed ABIs of the right and moderate on the left.  We discussed the need for increased activity and she is considering water aerobics at the [facility].  She will follow-up in 1 year with repeat noninvasive studies.     Penne Lonni Colorado MD [facility]

## 2023-11-17 DIAGNOSIS — H5711 Ocular pain, right eye: Secondary | ICD-10-CM | POA: Diagnosis not present

## 2023-11-17 DIAGNOSIS — H2000 Unspecified acute and subacute iridocyclitis: Secondary | ICD-10-CM | POA: Diagnosis not present

## 2023-11-17 LAB — OPHTHALMOLOGY REPORT-SCANNED

## 2023-11-21 ENCOUNTER — Encounter (HOSPITAL_BASED_OUTPATIENT_CLINIC_OR_DEPARTMENT_OTHER): Payer: Self-pay | Admitting: Adult Health

## 2023-11-21 DIAGNOSIS — N2581 Secondary hyperparathyroidism of renal origin: Secondary | ICD-10-CM | POA: Diagnosis not present

## 2023-11-21 DIAGNOSIS — I129 Hypertensive chronic kidney disease with stage 1 through stage 4 chronic kidney disease, or unspecified chronic kidney disease: Secondary | ICD-10-CM | POA: Diagnosis not present

## 2023-11-21 DIAGNOSIS — D631 Anemia in chronic kidney disease: Secondary | ICD-10-CM | POA: Diagnosis not present

## 2023-11-21 DIAGNOSIS — N1831 Chronic kidney disease, stage 3a: Secondary | ICD-10-CM | POA: Diagnosis not present

## 2023-11-22 ENCOUNTER — Other Ambulatory Visit

## 2023-11-22 ENCOUNTER — Ambulatory Visit
Admission: RE | Admit: 2023-11-22 | Discharge: 2023-11-22 | Disposition: A | Discharge status: Home / Self Care | Source: Ambulatory Visit | Attending: Adult Health | Admitting: Adult Health

## 2023-11-22 DIAGNOSIS — R918 Other nonspecific abnormal finding of lung field: Secondary | ICD-10-CM

## 2023-11-22 DIAGNOSIS — R0602 Shortness of breath: Secondary | ICD-10-CM | POA: Diagnosis not present

## 2023-11-22 DIAGNOSIS — J984 Other disorders of lung: Secondary | ICD-10-CM | POA: Diagnosis not present

## 2023-11-24 DIAGNOSIS — H401131 Primary open-angle glaucoma, bilateral, mild stage: Secondary | ICD-10-CM | POA: Diagnosis not present

## 2023-11-24 DIAGNOSIS — H2000 Unspecified acute and subacute iridocyclitis: Secondary | ICD-10-CM | POA: Diagnosis not present

## 2023-11-24 DIAGNOSIS — Z961 Presence of intraocular lens: Secondary | ICD-10-CM | POA: Diagnosis not present

## 2023-11-24 LAB — OPHTHALMOLOGY REPORT-SCANNED

## 2023-12-01 ENCOUNTER — Ambulatory Visit: Payer: Self-pay | Admitting: Adult Health

## 2023-12-06 ENCOUNTER — Telehealth: Payer: Self-pay

## 2023-12-06 NOTE — Telephone Encounter (Signed)
 Completed refill form for Ozempic  and faxed to provider's office for review and signature.

## 2023-12-08 ENCOUNTER — Other Ambulatory Visit: Payer: Self-pay | Admitting: Internal Medicine

## 2023-12-08 DIAGNOSIS — H401111 Primary open-angle glaucoma, right eye, mild stage: Secondary | ICD-10-CM | POA: Diagnosis not present

## 2023-12-08 LAB — OPHTHALMOLOGY REPORT-SCANNED

## 2023-12-09 ENCOUNTER — Other Ambulatory Visit: Payer: Self-pay

## 2023-12-09 MED ORDER — ACCU-CHEK SOFTCLIX LANCETS MISC: 12 refills | Status: AC

## 2023-12-12 ENCOUNTER — Other Ambulatory Visit: Payer: Self-pay

## 2023-12-12 MED ORDER — ACCU-CHEK GUIDE TEST VI STRP: ORAL_STRIP | 12 refills | Status: AC

## 2023-12-15 ENCOUNTER — Other Ambulatory Visit: Payer: Self-pay | Admitting: Nurse Practitioner

## 2023-12-15 ENCOUNTER — Other Ambulatory Visit: Payer: Self-pay | Admitting: Family Medicine

## 2023-12-15 DIAGNOSIS — K219 Gastro-esophageal reflux disease without esophagitis: Secondary | ICD-10-CM

## 2023-12-16 ENCOUNTER — Telehealth: Payer: Self-pay | Admitting: Internal Medicine

## 2023-12-16 MED ORDER — METOPROLOL SUCCINATE ER 100 MG PO TB24: 150.0000 mg | ORAL_TABLET | Freq: Every day | ORAL | 2 refills | Status: AC

## 2023-12-16 NOTE — Telephone Encounter (Signed)
 RX sent in

## 2023-12-16 NOTE — Telephone Encounter (Signed)
*  STAT* If patient is at the pharmacy, call can be transferred to refill team.   1. Which medications need to be refilled? (please list name of each medication and dose if known)   metoprolol  succinate (TOPROL -XL) 100 MG 24 hr tablet   2. Would you like to learn more about the convenience, safety, & potential cost savings by using the [facility]?   3. Are you open to using the [facility] (Type [facility]. ).  4. Which pharmacy/location (including street and city if local pharmacy) is medication to be sent to?  [facility] [phone] - [place], [place] - [address]   5. Do they need a 30 day or 90 day supply?   90 day  Patient stated she is almost out of this medication.

## 2023-12-20 ENCOUNTER — Telehealth: Payer: Self-pay | Admitting: Pharmacist

## 2023-12-20 ENCOUNTER — Encounter: Payer: Self-pay | Admitting: Pharmacist

## 2023-12-20 DIAGNOSIS — N1831 Chronic kidney disease, stage 3a: Secondary | ICD-10-CM

## 2023-12-21 ENCOUNTER — Telehealth: Payer: Self-pay

## 2023-12-21 NOTE — Telephone Encounter (Signed)
 PAP: Patient assistance application for Jardiance through [employer]) has been mailed to pt's home address on file. Provider portion of application will be faxed to provider's office.

## 2023-12-21 NOTE — Progress Notes (Addendum)
 12/21/2023  Patient ID: Dawn Wood, female   DOB: 1945/05/10, 78 y.o.   MRN: [id number]  Patient left a message requesting a call back about patient assistance applications for 2026 and getting a refill on Ozepmic.  She was called back. HIPAA identifiers were obtained.    Patient's medications were reviewed: Medications Reviewed Today     Reviewed by Jolee Cassius PARAS, [facility] (Pharmacist) on 12/21/23 at 1036  Med List Status: <None>   Medication Order Taking? Sig Documenting Provider Last Dose Status Informant  Accu-Chek Softclix Lancets lancets [id number] Yes USE TO CHECK BLOOD SUGARS ONCE DAILY. Jarold Medici, MD  Active   albuterol  (VENTOLIN  HFA) 108 [phone] Base) MCG/ACT inhaler [id number] Yes Inhale 2 puffs into the lungs every 6 (six) hours as needed for wheezing or shortness of breath. Kassie Acquanetta Bradley, MD  Active Self, Pharmacy Records  BD PEN NEEDLE NANO 2ND GEN 32G X 4 MM MISC [id number] Yes USE AS DIRECTED TWICE DAILY Jarold Medici, MD  Active Self, Pharmacy Records  Blood Glucose Monitoring Suppl (ACCU-CHEK GUIDE) w/Device KIT [id number] Yes USE TO CHECK BLOOD SUGARS ONCE DAILY. Jarold Medici, MD  Active Self, Pharmacy Records  budesonide -formoterol  (SYMBICORT ) 160-4.5 MCG/ACT inhaler [id number] Yes Inhale 2 puffs into the lungs 2 (two) times daily. Kassie Acquanetta Bradley, MD  Active Self, Pharmacy Records  Cholecalciferol (VITAMIN D3) 2000 units TABS [id number] Yes Take 1 tablet by mouth daily. [provider]  Active Self, Pharmacy Records  colchicine  0.6 MG tablet [id number] Yes Take 1 tablet (0.6 mg total) by mouth as needed (for gout). Jarold Medici, MD  Active Self, Pharmacy Records  Continuous Glucose Sensor [facility] G7 SENSOR) [place] [id number]  Use daily to check blood sugars. Change every 10 days.  Patient not taking: Reported on 12/20/2023   Jarold Medici, MD  Active   diltiazem  (CARDIZEM  CD) 180 MG 24 hr capsule [id number] Yes Take 1 capsule (180 mg total) by mouth  daily. Jarold Medici, MD  Active   dorzolamide -timolol  (COSOPT ) 2-0.5 % ophthalmic solution [id number] Yes Place 1 drop into the right eye 2 (two) times daily. [provider]  Active Self, Pharmacy Records  ELIQUIS  5 MG TABS tablet [id number] Yes TAKE 1 TABLET(5 MG) BY MOUTH TWICE DAILY Conte, Tessa N, PA-C  Active Self, Pharmacy Records  empagliflozin  (JARDIANCE ) 10 MG TABS tablet [id number] Yes Take 1 tablet (10 mg total) by mouth daily before breakfast. Thukkani, Arun K, MD  Active   Evolocumab  (REPATHA  SURECLICK) 140 MG/ML SOAJ [id number] Yes Inject 140 mg into the skin every 14 (fourteen) days. Thukkani, Arun K, MD  Active   fluticasone  (FLONASE ) 50 MCG/ACT nasal spray [id number] Yes Place 2 sprays into both nostrils daily. Petrina Pries, NP  Active Self, Pharmacy Records  furosemide  (LASIX ) 20 MG tablet [id number] Yes TAKE 1 TABLET(20 MG) BY MOUTH DAILY Lucien Orren SAILOR, PA-C  Active Self, Pharmacy Records  glucose blood (ACCU-CHEK GUIDE TEST) test strip [id number] Yes USE ONCE DAILT TO CHECK BLOOD SUGAR Jarold Medici, MD  Active   glucose blood (ACCU-CHEK GUIDE TEST) test strip [id number] Yes Use as instructed Jarold Medici, MD  Active   glucose blood (ACCU-CHEK GUIDE) test strip [id number] Yes USE TO CHECK BLOOD SUGARS ONCE DAILY. Jarold Medici, MD  Active Self, Pharmacy Records  insulin  aspart protamine - aspart (NOVOLOG  MIX 70/30 FLEXPEN) (70-30) 100 UNIT/ML FlexPen [id number] Yes INJECT 40 UNITS SUBCUTANEOUS EVERY MORNING AND 50 UNITS SUBCUTANEOUS AT JOAQUIN Jarold Medici, MD  Active Self, Pharmacy Records  isosorbide  mononitrate (  IMDUR ) 60 MG 24 hr tablet [id number] Yes Take 1 tablet (60 mg total) by mouth daily. Wyn Jackee VEAR Mickey., NP  Active   Latanoprostene Bunod  (VYZULTA ) 0.024 % LARRAINE [id number]  Place 1 drop into the right eye at bedtime. [provider]  Active   methocarbamol  (ROBAXIN ) 500 MG tablet [id number] Yes Take 1 tablet (500 mg total) by mouth every 8 (eight) hours as  needed for muscle spasms. Jarold Medici, MD  Active   metoprolol  succinate (TOPROL -XL) 100 MG 24 hr tablet [id number] Yes Take 1.5 tablets (150 mg total) by mouth daily. Take with or immediately following a meal. Thukkani, Arun K, MD  Active   nitroGLYCERIN  (NITROSTAT ) 0.4 MG SL tablet [id number] Yes Place 1 tablet (0.4 mg total) under the tongue every 5 (five) minutes as needed for chest pain. Hobart Powell BRAVO, MD  Active Self, Pharmacy Records  olmesartan  (BENICAR ) 40 MG tablet [id number] Yes TAKE 1 TABLET(40 MG) BY MOUTH DAILY Jarold Medici, MD  Active Self, Pharmacy Records  [facility] VERIO test strip [id number]  USE TO CHECK BLOOD SUGARS TWICE DAILY AS DIRECTED Jarold Medici, MD  Active Self, Pharmacy Records  pantoprazole  (PROTONIX ) 40 MG tablet [id number] Yes TAKE 1 TABLET(40 MG) BY MOUTH DAILY Petrina Pries, NP  Active   polyethylene glycol (MIRALAX ) 17 g packet [id number] Yes Take 17 g by mouth daily. Jarold Medici, MD  Active   potassium chloride  (KLOR-CON ) 10 MEQ tablet [id number] Yes Take 1 tablet (10 mEq total) by mouth at bedtime. Jonel Lonni SQUIBB, MD  Active   Semaglutide ,0.25 or 0.5MG /DOS, (OZEMPIC , 0.25 OR 0.5 MG/DOSE,) 2 MG/1.5ML SOPN [id number] Yes INJECT 0.5MG  INTO SKIN ONCE A JANET Jarold Medici, MD  Active Self, Pharmacy Records  spironolactone  (ALDACTONE ) 25 MG tablet [id number] Yes TAKE 1 TABLET(25 MG) BY MOUTH DAILY Dick, Ernest H Jr., NP  Active   traMADol  (ULTRAM ) 50 MG tablet [id number] Yes Take 1 tablet (50 mg total) by mouth every 12 (twelve) hours as needed. Jarold Medici, MD  Active             Lab Results  Component Value Date   HGBA1C 6.3 (H) 08/24/2023   HGBA1C 6.1 (H) 04/21/2023   HGBA1C 7.1 (H) 11/30/2022    Documented statin intolerance but has not been coded for the year. No upcoming appointment until 05/04/23  Renewal application for Novolog  70/30 and Novofine needles, Refill form for Ozempic . Patient was informed Ozempic  will not be available  next year   Cassius DOROTHA Brought, PharmD, [facility] Clinical Pharmacist [phone]

## 2023-12-21 NOTE — Progress Notes (Signed)
 error

## 2023-12-21 NOTE — Telephone Encounter (Signed)
 PAP: Patient assistance application for Novolog  Mix 70/30 through [employer] has been mailed to pt's home address on file. Provider portion of application will be faxed to provider's office. E-filed patient portion

## 2023-12-22 ENCOUNTER — Telehealth: Payer: Self-pay | Admitting: Pharmacist

## 2023-12-22 ENCOUNTER — Encounter: Payer: Self-pay | Admitting: Internal Medicine

## 2023-12-22 ENCOUNTER — Ambulatory Visit: Payer: Self-pay | Admitting: Internal Medicine

## 2023-12-22 VITALS — BP 128/80 | HR 52 | Temp 98.4°F | Ht 64.0 in | Wt 223.4 lb

## 2023-12-22 DIAGNOSIS — Z23 Encounter for immunization: Secondary | ICD-10-CM

## 2023-12-22 DIAGNOSIS — E1122 Type 2 diabetes mellitus with diabetic chronic kidney disease: Secondary | ICD-10-CM | POA: Diagnosis not present

## 2023-12-22 DIAGNOSIS — G720 Drug-induced myopathy: Secondary | ICD-10-CM

## 2023-12-22 DIAGNOSIS — T466X5A Adverse effect of antihyperlipidemic and antiarteriosclerotic drugs, initial encounter: Secondary | ICD-10-CM | POA: Diagnosis not present

## 2023-12-22 DIAGNOSIS — N1831 Chronic kidney disease, stage 3a: Secondary | ICD-10-CM

## 2023-12-22 DIAGNOSIS — Z794 Long term (current) use of insulin: Secondary | ICD-10-CM

## 2023-12-22 DIAGNOSIS — Z6838 Body mass index (BMI) 38.0-38.9, adult: Secondary | ICD-10-CM

## 2023-12-22 DIAGNOSIS — I130 Hypertensive heart and chronic kidney disease with heart failure and stage 1 through stage 4 chronic kidney disease, or unspecified chronic kidney disease: Secondary | ICD-10-CM | POA: Diagnosis not present

## 2023-12-22 DIAGNOSIS — K219 Gastro-esophageal reflux disease without esophagitis: Secondary | ICD-10-CM

## 2023-12-22 DIAGNOSIS — R350 Frequency of micturition: Secondary | ICD-10-CM | POA: Diagnosis not present

## 2023-12-22 DIAGNOSIS — J4489 Other specified chronic obstructive pulmonary disease: Secondary | ICD-10-CM

## 2023-12-22 DIAGNOSIS — E66812 Obesity, class 2: Secondary | ICD-10-CM

## 2023-12-22 MED ORDER — PREGABALIN 50 MG PO CAPS: ORAL_CAPSULE | ORAL | 2 refills | Status: DC

## 2023-12-22 NOTE — Progress Notes (Signed)
 I,Victoria T Emmitt, CMA,acting as a neurosurgeon for Catheryn LOISE Slocumb, MD.,have documented all relevant documentation on the behalf of Catheryn LOISE Slocumb, MD,as directed by  Catheryn LOISE Slocumb, MD while in the presence of Catheryn LOISE Slocumb, MD.  Subjective:  Patient ID: Dawn Wood , female    DOB: 1945-04-14 , 78 y.o.   MRN: [id number]  Chief Complaint  Patient presents with   Diabetes    Patient presents today for a HTN/DM check. She reports compliance with meds. She complains of vaginal tenderness. She was given one diflucan pill by kidney specialist. Taken last Saturday. She was also told to stop the Jardiance .  She also experiences numbness in her feet & toes.    Hypertension    HPI Discussed the use of AI scribe software for clinical note transcription with the patient, who gave verbal consent to proceed.  History of Present Illness Dawn Wood is a 78 year old female with diabetes and hypertension who presents for management of her diabetes and blood pressure.  She experiences shortness of breath with minimal activities such as putting on socks. She uses an albuterol  inhaler as needed and Symbicort  twice daily, but finds them ineffective. She has not communicated this to her lung doctor yet and does not have a nebulizer at home. She feels congested but has no cough or cold.  Her current medications include Cardizem  (diltiazem ) for blood pressure, Eliquis  as a blood thinner, spironolactone  25 mg, Ozempic  0.5 mg, potassium supplements, Protonix  for reflux, olmesartan  40 mg, nitroglycerin  as needed, metoprolol  150 mg, and Imdur . Her insulin  regimen is 70/30 with 40 units in the morning and 50 units before dinner. Her morning blood sugar was 132 mg/dL today and she states her sugars have been doing well.  She experiences significant fatigue and back pain, which limits her physical activity. She also reports tingling in her feet and toes and does not sleep well at night, waking up  at least four times to urinate. She has previously tried gabapentin  for neuropathy without success.  She has a history of frequent yeast infections, which led to the discontinuation of Jardiance  by her heart doctor. She has taken medication for yeast infections twice but still feels unwell 'down there'. She experiences urinary frequency and has submitted a urine sample for analysis today.  She recently had an eye infection and is unsure of when her last regular diabetes eye exam was. She is unable to recall the name of her current eye doctor but believes it might be Dr. Charmayne.   Diabetes She presents for her follow-up diabetic visit. She has type 2 diabetes mellitus. Her disease course has been stable. There are no hypoglycemic associated symptoms. Pertinent negatives for diabetes include no blurred vision, no polydipsia, no polyphagia and no polyuria. There are no hypoglycemic complications. Diabetic complications include nephropathy. Risk factors for coronary artery disease include diabetes mellitus, dyslipidemia, hypertension, obesity, sedentary lifestyle and post-menopausal. Current diabetic treatment includes insulin  injections. She is following a diabetic diet. She participates in exercise intermittently. Her home blood glucose trend is fluctuating minimally. Her breakfast blood glucose is taken between 8-9 am. Her breakfast blood glucose range is generally 110-130 mg/dl. Eye exam is current.  Hypertension This is a chronic problem. The current episode started more than 1 year ago. The problem has been gradually improving since onset. Pertinent negatives include no blurred vision. Risk factors for coronary artery disease include diabetes mellitus, dyslipidemia, sedentary lifestyle and post-menopausal state. Hypertensive end-organ damage includes  kidney disease.     Past Medical History:  Diagnosis Date   Asthma    Atrial fibrillation (HCC)    CKD (chronic kidney disease)    Coronary artery  disease    Diabetes mellitus without complication (HCC)    GERD (gastroesophageal reflux disease)    Heart attack (HCC) 12/2022   Hypertension    MI (myocardial infarction) (HCC)    Peripheral vascular disease    Sleep apnea      Family History  Problem Relation Age of Onset   Hypertension Mother    Hypertension Father    Throat cancer Father    Breast cancer Sister 22   Aortic stenosis Daughter    Heart disease Daughter        before age 33   Thyroid  disease Daughter    Sleep apnea Neg Hx      Current Outpatient Medications:    Accu-Chek Softclix Lancets lancets, USE TO CHECK BLOOD SUGARS ONCE DAILY., Disp: 100 each, Rfl: 12   albuterol  (VENTOLIN  HFA) 108 (90 Base) MCG/ACT inhaler, Inhale 2 puffs into the lungs every 6 (six) hours as needed for wheezing or shortness of breath., Disp: 8 g, Rfl: 2   BD PEN NEEDLE NANO 2ND GEN 32G X 4 MM MISC, USE AS DIRECTED TWICE DAILY, Disp: 100 each, Rfl: 2   Blood Glucose Monitoring Suppl (ACCU-CHEK GUIDE) w/Device KIT, USE TO CHECK BLOOD SUGARS ONCE DAILY., Disp: 1 kit, Rfl: 0   budesonide -formoterol  (SYMBICORT ) 160-4.5 MCG/ACT inhaler, Inhale 2 puffs into the lungs 2 (two) times daily., Disp: 10.2 g, Rfl: 11   Cholecalciferol (VITAMIN D3) 2000 units TABS, Take 1 tablet by mouth daily., Disp: , Rfl:    colchicine  0.6 MG tablet, Take 1 tablet (0.6 mg total) by mouth as needed (for gout)., Disp: 30 tablet, Rfl: 1   Continuous Glucose Sensor (DEXCOM G7 SENSOR) MISC, Use daily to check blood sugars. Change every 10 days., Disp: 4 each, Rfl: 2   diltiazem  (CARDIZEM  CD) 180 MG 24 hr capsule, Take 1 capsule (180 mg total) by mouth daily., Disp: 90 capsule, Rfl: 1   dorzolamide -timolol  (COSOPT ) 2-0.5 % ophthalmic solution, Place 1 drop into the right eye 2 (two) times daily., Disp: , Rfl:    ELIQUIS  5 MG TABS tablet, TAKE 1 TABLET(5 MG) BY MOUTH TWICE DAILY, Disp: 180 tablet, Rfl: 1   Evolocumab  (REPATHA  SURECLICK) 140 MG/ML SOAJ, Inject 140 mg into  the skin every 14 (fourteen) days., Disp: 6 mL, Rfl: 3   fluticasone  (FLONASE ) 50 MCG/ACT nasal spray, Place 2 sprays into both nostrils daily., Disp: 16 g, Rfl: 2   furosemide  (LASIX ) 20 MG tablet, TAKE 1 TABLET(20 MG) BY MOUTH DAILY, Disp: 30 tablet, Rfl: 10   glucose blood (ACCU-CHEK GUIDE TEST) test strip, USE ONCE DAILT TO CHECK BLOOD SUGAR, Disp: 100 strip, Rfl: 2   glucose blood (ACCU-CHEK GUIDE TEST) test strip, Use as instructed, Disp: 100 each, Rfl: 12   glucose blood (ACCU-CHEK GUIDE) test strip, USE TO CHECK BLOOD SUGARS ONCE DAILY., Disp: 100 each, Rfl: 12   insulin  aspart protamine - aspart (NOVOLOG  MIX 70/30 FLEXPEN) (70-30) 100 UNIT/ML FlexPen, INJECT 40 UNITS SUBCUTANEOUS EVERY MORNING AND 50 UNITS SUBCUTANEOUS AT DINNER (Patient taking differently: INJECT 45 UNITS SUBCUTANEOUS EVERY MORNING AND 50 UNITS SUBCUTANEOUS AT DINNER), Disp: 30 mL, Rfl: 2   isosorbide  mononitrate (IMDUR ) 60 MG 24 hr tablet, Take 1 tablet (60 mg total) by mouth daily., Disp: 90 tablet, Rfl: 2   Latanoprostene  Bunod (VYZULTA ) 0.024 % SOLN, Place 1 drop into the right eye at bedtime., Disp: , Rfl:    methocarbamol  (ROBAXIN ) 500 MG tablet, Take 1 tablet (500 mg total) by mouth every 8 (eight) hours as needed for muscle spasms., Disp: 30 tablet, Rfl: 0   metoprolol  succinate (TOPROL -XL) 100 MG 24 hr tablet, Take 1.5 tablets (150 mg total) by mouth daily. Take with or immediately following a meal., Disp: 135 tablet, Rfl: 2   nitroGLYCERIN  (NITROSTAT ) 0.4 MG SL tablet, Place 1 tablet (0.4 mg total) under the tongue every 5 (five) minutes as needed for chest pain., Disp: 25 tablet, Rfl: 11   olmesartan  (BENICAR ) 40 MG tablet, TAKE 1 TABLET(40 MG) BY MOUTH DAILY, Disp: 90 tablet, Rfl: 2   ONETOUCH VERIO test strip, USE TO CHECK BLOOD SUGARS TWICE DAILY AS DIRECTED (Patient not taking: Reported on 12/28/2023), Disp: 100 strip, Rfl: 3   pantoprazole  (PROTONIX ) 40 MG tablet, TAKE 1 TABLET(40 MG) BY MOUTH DAILY, Disp: 90  tablet, Rfl: 1   polyethylene glycol (MIRALAX ) 17 g packet, Take 17 g by mouth daily., Disp: 30 each, Rfl: 0   potassium chloride  (KLOR-CON ) 10 MEQ tablet, Take 1 tablet (10 mEq total) by mouth at bedtime., Disp: , Rfl:    pregabalin (LYRICA) 50 MG capsule, ONE CAPSULE PO TWICE DAILY, Disp: 60 capsule, Rfl: 2   Semaglutide ,0.25 or 0.5MG /DOS, (OZEMPIC , 0.25 OR 0.5 MG/DOSE,) 2 MG/1.5ML SOPN, INJECT 0.5MG  INTO SKIN ONCE A WEEK, Disp: 4.5 mL, Rfl: 3   spironolactone  (ALDACTONE ) 25 MG tablet, TAKE 1 TABLET(25 MG) BY MOUTH DAILY, Disp: 90 tablet, Rfl: 1   traMADol  (ULTRAM ) 50 MG tablet, Take 1 tablet (50 mg total) by mouth every 12 (twelve) hours as needed., Disp: 20 tablet, Rfl: 0   Allergies  Allergen Reactions   Dust Mite Extract Other (See Comments)    REACTION: SNEEZING   Other Other (See Comments)    ALLERGEN: DEODORIZERS SPRAY AND PERFUMES REACTION: SNEEZING   Statins Other (See Comments)    Muscle aches with rosuvastatin  5 mg daily and 3x/weekly, atorvastatin  20 mg daily and 3x/week, simvastatin 40 mg daily    Allopurinol      Diarrhea and the pt said it made her sugars low   Pollen Extract Other (See Comments)    Reaction unknown     Review of Systems  Constitutional: Negative.   Eyes:  Negative for blurred vision.  Respiratory: Negative.    Cardiovascular: Negative.   Gastrointestinal: Negative.   Endocrine: Negative for polydipsia, polyphagia and polyuria.  Neurological: Negative.   Psychiatric/Behavioral: Negative.       Today's Vitals   12/22/23 1453  BP: 128/80  Pulse: (!) 52  Temp: 98.4 F (36.9 C)  SpO2: 98%  Weight: 223 lb 6.4 oz (101.3 kg)  Height: 5' 4 (1.626 m)   Body mass index is 38.35 kg/m.  Wt Readings from Last 3 Encounters:  12/22/23 223 lb 6.4 oz (101.3 kg)  11/16/23 222 lb (100.7 kg)  11/08/23 223 lb 12.8 oz (101.5 kg)     Objective:  Physical Exam Vitals and nursing note reviewed.  Constitutional:      Appearance: Normal appearance. She  is obese.  HENT:     Head: Normocephalic and atraumatic.  Eyes:     Extraocular Movements: Extraocular movements intact.  Cardiovascular:     Rate and Rhythm: Normal rate and regular rhythm.     Heart sounds: Normal heart sounds.  Pulmonary:     Effort: Pulmonary effort  is normal.     Breath sounds: Normal breath sounds.  Musculoskeletal:     Cervical back: Normal range of motion.  Skin:    General: Skin is warm.  Neurological:     General: No focal deficit present.     Mental Status: She is alert.  Psychiatric:        Mood and Affect: Mood normal.        Behavior: Behavior normal.         Assessment And Plan:  Type 2 diabetes mellitus with stage 3a chronic kidney disease, with long-term current use of insulin  (HCC) Assessment & Plan: Diabetic peripheral neuropathy with ineffective gabapentin  trial. Stage 3A chronic kidney disease stable. Blood sugars controlled. - Start Lyrica 50 mg twice daily for neuropathy. Monitor for sedation; adjust to nighttime if needed. - Check kidney function before increasing medication dosage.  Orders: -     CMP14+EGFR -     Hemoglobin A1c  Hypertensive heart and renal disease with renal failure, stage 1 through stage 4 or unspecified chronic kidney disease, with heart failure (HCC) Assessment & Plan: Chronic, fair control.  Goal BP <130/80.  Heart failure with exertional dyspnea. Current medications include Cardizem , Eliquis , spironolactone , olmesartan , nitroglycerin , metoprolol , and Imdur . - Continue current medications for heart failure and hypertension. - Cardiology input is appreciated.    Gastroesophageal reflux disease without esophagitis Assessment & Plan: GERD managed with Protonix . - Continue Protonix  as prescribed.   Asthma-COPD overlap syndrome (HCC) Assessment & Plan: Diagnosed with asthma per Pulmonary. Likely has COPD as well given her significant smoking history. Unfortunately,she has  persistent dyspnea despite  albuterol  and Symbicort .  - Consult pulmonologist on inhaler efficacy and alternative treatments. - Breztri should be considered.   Urinary frequency Assessment & Plan: Suspected acute cystitis with urinary frequency. Awaiting urine analysis results. - Analyze urine sample for infection and hematuria. - Delay flu shot until urine results are available and treatment is initiated.  Orders: -     POCT urinalysis dipstick -     Urine Culture  Class 2 severe obesity due to excess calories with serious comorbidity and body mass index (BMI) of 38.0 to 38.9 in adult Assessment & Plan: She is encouraged to strive for BMI less than 30 to decrease cardiac risk. Advised to aim for at least 150 minutes of exercise per week. She would likely benefit from the PREP program.     Statin myopathy Assessment & Plan: Chronic, she is intolerant of statins. She is now on Repatha .     Other orders -     Pregabalin; ONE CAPSULE PO TWICE DAILY  Dispense: 60 capsule; Refill: 2    Return in 2 weeks (on 01/05/2024), or [place] - 2 weeks.  Patient was given opportunity to ask questions. Patient verbalized understanding of the plan and was able to repeat key elements of the plan. All questions were answered to their satisfaction.   I, Catheryn LOISE Slocumb, MD, have reviewed all documentation for this visit. The documentation on 12/22/23 for the exam, diagnosis, procedures, and orders are all accurate and complete.   IF YOU HAVE BEEN REFERRED TO A SPECIALIST, IT MAY TAKE 1-2 WEEKS TO SCHEDULE/PROCESS THE REFERRAL. IF YOU HAVE NOT HEARD FROM US /SPECIALIST IN TWO WEEKS, PLEASE GIVE US  A CALL AT [phone] X [phone].   THE PATIENT IS ENCOURAGED TO PRACTICE SOCIAL DISTANCING DUE TO THE COVID-19 PANDEMIC.

## 2023-12-22 NOTE — Progress Notes (Signed)
 12/22/2023 Name: Dawn Wood MRN: [id number] DOB: 02/28/1946  Chief Complaint  Patient presents with   Medication Management    Dexcom    Dawn Wood is a 78 y.o. year old female who was referred for medication management by their primary care provider, Jarold Medici, MD. They presented for a face to face visit today.   They were referred to the pharmacist by their PCP for assistance in managing medication access.  Patient assistance forms were faxed to Luke Edis, CPhT on the Patient's behalf:  Renewal application for Novolog  70/30 and Novofine needles, Refill form for Ozempic .    Care Team: Primary Care Provider: Jarold Medici, MD   Medication Access/Adherence  Current Pharmacy:  [facility] [address] [place] - [address] AT [facility] [address] [place] [address] Phone: [phone] Fax: [phone]  [facility] [address] [place] [address] Phone: [phone] Fax: [phone]  [id number] [place], [place] - [address] AT [facility] [address] [place] [address] Phone: [phone] Fax: [phone]  [facility] [phone] - [place], [place] - [address] AT [facility] [address] [place] [address] Phone: [phone] Fax: [phone]   Patient reports affordability concerns with their medications: Yes  Patient reports access/transportation concerns to their pharmacy: No  Patient reports adherence concerns with their medications:  No      Diabetes: Lab Results  Component Value Date   HGBA1C 6.3 (H) 08/24/2023   HGBA1C 6.1 (H) 04/21/2023   HGBA1C 7.1 (H) 11/30/2022    Current medications:   Ozempic  0.5 mg weekly Novolog  Mix 70/30 -40 units in the morning and 50 units at dinner   Patient denies hypoglycemic s/sx including  dizziness, shakiness, sweating.  Patient reports hyperglycemic symptoms including  polyuria and nocturia.   Macrovascular and Microvascular Risk Reduction:  Statin? no; patient previously intolerant to statin therapy; ACEi/ARB? no Last urinary albumin/creatinine ratio:  Lab Results  Component Value Date   MICRALBCREAT 331 (H) 06/23/2023   MICRALBCREAT 50 (H) 05/04/2022   MICRALBCREAT <30 02/16/2021   MICRALBCREAT <30 10/04/2019   MICRALBCREAT <30 12/29/2017   Last eye exam:  Lab Results  Component Value Date   HMDIABEYEEXA No Retinopathy 05/21/2021   Last foot exam: No foot exam found Tobacco Use:  Tobacco Use: Medium Risk (12/22/2023)   Patient History    Smoking Tobacco Use: Former    Smokeless Tobacco Use: Never    Passive Exposure: Never     Objective:  Lab Results  Component Value Date   HGBA1C 6.3 (H) 08/24/2023    Lab Results  Component Value Date   CREATININE 1.17 (H) 08/24/2023   BUN 18 08/24/2023   NA 141 08/24/2023   K 4.1 08/24/2023   CL 103 08/24/2023   CO2 22 08/24/2023    Lab Results  Component Value Date   CHOL 132 10/03/2023   HDL 44 10/03/2023   LDLCALC 56 10/03/2023   LDLDIRECT 83 06/11/2019   TRIG 199 (H) 10/03/2023   CHOLHDL 3.0 10/03/2023    Medications Reviewed Today     Reviewed by Jolee Cassius PARAS, RPH (Pharmacist) on 12/22/23 at 1939  Med List Status: <None>   Medication Order Taking? Sig Documenting Provider Last Dose Status Informant  Accu-Chek Softclix Lancets lancets [id number] Yes USE TO CHECK BLOOD SUGARS ONCE DAILY.  Jarold Medici, MD  Active   albuterol  (VENTOLIN  HFA) 108 [phone] Base) MCG/ACT inhaler [id number] Yes Inhale 2 puffs into the lungs every 6 (six) hours as needed for wheezing or shortness of breath. Kassie Acquanetta Bradley, MD  Active Self, Pharmacy Records  BD PEN NEEDLE NANO 2ND GEN 32G X 4 MM MISC [id number] Yes USE AS DIRECTED TWICE DAILY Jarold Medici, MD  Active Self, Pharmacy Records  Blood Glucose Monitoring Suppl (ACCU-CHEK GUIDE) w/Device KIT  [id number] Yes USE TO CHECK BLOOD SUGARS ONCE DAILY. Jarold Medici, MD  Active Self, Pharmacy Records  budesonide -formoterol  (SYMBICORT ) 160-4.5 MCG/ACT inhaler [id number] Yes Inhale 2 puffs into the lungs 2 (two) times daily. Kassie Acquanetta Bradley, MD  Active Self, Pharmacy Records  Cholecalciferol (VITAMIN D3) 2000 units TABS [id number] Yes Take 1 tablet by mouth daily. [provider]  Active Self, Pharmacy Records  colchicine  0.6 MG tablet [id number] Yes Take 1 tablet (0.6 mg total) by mouth as needed (for gout). Jarold Medici, MD  Active Self, Pharmacy Records  Continuous Glucose Sensor [facility] G7 SENSOR) [place] [id number]  Use daily to check blood sugars. Change every 10 days.  Patient not taking: Reported on 12/22/2023   Jarold Medici, MD  Active   diltiazem  (CARDIZEM  CD) 180 MG 24 hr capsule [id number] Yes Take 1 capsule (180 mg total) by mouth daily. Jarold Medici, MD  Active   dorzolamide -timolol  (COSOPT ) 2-0.5 % ophthalmic solution [id number] Yes Place 1 drop into the right eye 2 (two) times daily. [provider]  Active Self, Pharmacy Records  ELIQUIS  5 MG TABS tablet [id number] Yes TAKE 1 TABLET(5 MG) BY MOUTH TWICE DAILY Conte, Tessa N, PA-C  Active Self, Pharmacy Records  empagliflozin  (JARDIANCE ) 10 MG TABS tablet [id number]  Take 1 tablet (10 mg total) by mouth daily before breakfast.  Patient not taking: Reported on 12/22/2023   Thukkani, Arun K, MD  Consider Medication Status and Discontinue (Discontinued by provider)   Evolocumab  (REPATHA  SURECLICK) 140 MG/ML SOAJ [id number] Yes Inject 140 mg into the skin every 14 (fourteen) days. Thukkani, Arun K, MD  Active   fluticasone  (FLONASE ) 50 MCG/ACT nasal spray [id number] Yes Place 2 sprays into both nostrils daily. Petrina Pries, NP  Active Self, Pharmacy Records  furosemide  (LASIX ) 20 MG tablet [id number] Yes TAKE 1 TABLET(20 MG) BY MOUTH DAILY Lucien Orren SAILOR, PA-C  Active Self, Pharmacy Records  glucose blood (ACCU-CHEK GUIDE  TEST) test strip [id number] Yes USE ONCE DAILT TO CHECK BLOOD SUGAR Jarold Medici, MD  Active   glucose blood (ACCU-CHEK GUIDE TEST) test strip [id number] Yes Use as instructed Jarold Medici, MD  Active   glucose blood (ACCU-CHEK GUIDE) test strip [id number] Yes USE TO CHECK BLOOD SUGARS ONCE DAILY. Jarold Medici, MD  Active Self, Pharmacy Records  insulin  aspart protamine - aspart (NOVOLOG  MIX 70/30 FLEXPEN) (70-30) 100 UNIT/ML FlexPen [id number] Yes INJECT 40 UNITS SUBCUTANEOUS EVERY MORNING AND 50 UNITS SUBCUTANEOUS AT JOAQUIN Jarold, Medici, MD  Active Self, Pharmacy Records  isosorbide  mononitrate (IMDUR ) 60 MG 24 hr tablet [id number] Yes Take 1 tablet (60 mg total) by mouth daily. Wyn Jackee VEAR Mickey., NP  Active   Latanoprostene Bunod  (VYZULTA ) 0.024 % LARRAINE [id number]  Place 1 drop into the right eye at bedtime. [provider]  Active   methocarbamol  (ROBAXIN ) 500 MG tablet [id number] Yes Take 1 tablet (500 mg total) by mouth every 8 (eight) hours as needed for muscle spasms. Jarold Medici, MD  Active   metoprolol  succinate (TOPROL -XL) 100 MG 24 hr  tablet [id number] Yes Take 1.5 tablets (150 mg total) by mouth daily. Take with or immediately following a meal. Thukkani, Arun K, MD  Active   nitroGLYCERIN  (NITROSTAT ) 0.4 MG SL tablet [id number] Yes Place 1 tablet (0.4 mg total) under the tongue every 5 (five) minutes as needed for chest pain. Hobart Powell BRAVO, MD  Active Self, Pharmacy Records  olmesartan  (BENICAR ) 40 MG tablet [id number] Yes TAKE 1 TABLET(40 MG) BY MOUTH DAILY Jarold Medici, MD  Active Self, Pharmacy Records  [facility] VERIO test strip [id number] Yes USE TO CHECK BLOOD SUGARS TWICE DAILY AS DIRECTED Jarold Medici, MD  Active Self, Pharmacy Records  pantoprazole  (PROTONIX ) 40 MG tablet [id number] Yes TAKE 1 TABLET(40 MG) BY MOUTH DAILY Petrina Pries, NP  Active   polyethylene glycol (MIRALAX ) 17 g packet [id number] Yes Take 17 g by mouth daily. Jarold Medici, MD  Active    potassium chloride  (KLOR-CON ) 10 MEQ tablet [id number] Yes Take 1 tablet (10 mEq total) by mouth at bedtime. Jonel Lonni SQUIBB, MD  Active   pregabalin (LYRICA) 50 MG capsule [id number]  ONE CAPSULE PO TWICE DAILY Jarold Medici, MD  Active   Semaglutide ,0.25 or 0.5MG /DOS, (OZEMPIC , 0.25 OR 0.5 MG/DOSE,) 2 MG/1.5ML SOPN [id number] Yes INJECT 0.5MG  INTO SKIN ONCE A JANET Jarold Medici, MD  Active Self, Pharmacy Records  spironolactone  (ALDACTONE ) 25 MG tablet [id number] Yes TAKE 1 TABLET(25 MG) BY MOUTH DAILY Dick, Ernest H Jr., NP  Active   traMADol  (ULTRAM ) 50 MG tablet [id number] Yes Take 1 tablet (50 mg total) by mouth every 12 (twelve) hours as needed. Jarold Medici, MD  Active               12/22/2023    2:53 PM 11/16/2023    1:53 PM 11/08/2023   12:14 PM  Vitals with BMI  Height 5' 4 5' 4 5' 4  Weight 223 lbs 6 oz 222 lbs 223 lbs 13 oz  BMI 38.33 38.09 38.4  Systolic 128 164 877  Diastolic 80 79 80  Pulse 52 49 53      Assessment/Plan:   Diabetes: - Currently controlled; goal A1c <7%. Cardiorenal risk reduction is opportunities for improvement.. Blood pressure is at goal <130/80. LDL is at goal.  - Recommend Patient make an appointment for CGM training.    Follow Up Plan:    Patient has an appointment for CGM training with me next Wednesday at 2:20 pm   Cassius DOROTHA Brought, PharmD, [facility] Clinical Pharmacist [phone]

## 2023-12-22 NOTE — Patient Instructions (Signed)

## 2023-12-22 NOTE — Assessment & Plan Note (Signed)
 Chronic, she is intolerant of statins. She is now on Repatha .

## 2023-12-22 NOTE — Telephone Encounter (Signed)
 PAP: Application for Novolog  Mix 70/30 has been submitted to [employer], via fax and for novo fine needles.

## 2023-12-23 ENCOUNTER — Ambulatory Visit

## 2023-12-23 LAB — CMP14+EGFR
ALT: 14 IU/L (ref 0–32)
AST: 14 IU/L (ref 0–40)
Albumin: 4 g/dL (ref 3.8–4.8)
Alkaline Phosphatase: 109 IU/L (ref 49–135)
BUN/Creatinine Ratio: 14 (ref 12–28)
BUN: 14 mg/dL (ref 8–27)
Bilirubin Total: 0.4 mg/dL (ref 0.0–1.2)
CO2: 22 mmol/L (ref 20–29)
Calcium: 9 mg/dL (ref 8.7–10.3)
Chloride: 106 mmol/L (ref 96–106)
Creatinine, Ser: 1.03 mg/dL — ABNORMAL HIGH (ref 0.57–1.00)
Globulin, Total: 3.3 g/dL (ref 1.5–4.5)
Glucose: 52 mg/dL — ABNORMAL LOW (ref 70–99)
Potassium: 3.8 mmol/L (ref 3.5–5.2)
Sodium: 142 mmol/L (ref 134–144)
Total Protein: 7.3 g/dL (ref 6.0–8.5)
eGFR: 55 mL/min/1.73 — ABNORMAL LOW (ref >59)

## 2023-12-23 LAB — HEMOGLOBIN A1C
Est. average glucose Bld gHb Est-mCnc: 137 mg/dL
Hgb A1c MFr Bld: 6.4 % — ABNORMAL HIGH (ref 4.8–5.6)

## 2023-12-24 ENCOUNTER — Ambulatory Visit: Payer: Self-pay | Admitting: Internal Medicine

## 2023-12-26 ENCOUNTER — Other Ambulatory Visit (HOSPITAL_COMMUNITY): Payer: Self-pay

## 2023-12-28 ENCOUNTER — Ambulatory Visit (INDEPENDENT_AMBULATORY_CARE_PROVIDER_SITE_OTHER)

## 2023-12-28 DIAGNOSIS — Z794 Long term (current) use of insulin: Secondary | ICD-10-CM

## 2023-12-28 DIAGNOSIS — N1831 Chronic kidney disease, stage 3a: Secondary | ICD-10-CM

## 2023-12-28 DIAGNOSIS — E1122 Type 2 diabetes mellitus with diabetic chronic kidney disease: Secondary | ICD-10-CM

## 2023-12-28 LAB — URINE CULTURE

## 2023-12-28 NOTE — Telephone Encounter (Signed)
 Per Pharm D at office visit the Jardiance  has been D/C from regimen.

## 2023-12-28 NOTE — Progress Notes (Signed)
 12/28/2023 Name: Dawn Wood MRN: [id number] DOB: 1945-11-28  Chief Complaint  Patient presents with   Medication Management    Diabetes     Ha Dawn Wood is a 78 y.o. year old female who presented for a telephone visit.   They were referred to the pharmacist by their PCP for assistance in managing diabetes.    Subjective:  Care Team: Primary Care Provider: Jarold Medici, MD ; Next Scheduled Visit: 02/26   Medication Access/Adherence  Current Pharmacy:  [facility] #[id number] [place], [place] - [address] AT [facility] [address] [place] [address] Phone: [phone] Fax: [phone]  [facility] [address] [place] [address] Phone: [phone] Fax: [phone]  [facility] #[id number] - [place], [place] - [address] AT [facility] [address] [place] [address] Phone: [phone] Fax: [phone]  [id number] - [place], [place] - [address] AT [facility] [address] [place] [address] Phone: [phone] Fax: [phone]   Patient reports affordability concerns with their medications: Yes  Patient reports access/transportation concerns to their pharmacy: No  Patient reports adherence concerns with their medications:  No      Diabetes:  Current medications:   Novolog  73/030 45 units in the morning and 50 units in the evening    Current glucose readings: 113 mg/dl in clinic today.  Patient denies hypoglycemic s/sx including  dizziness, shakiness, sweating. Patient denies hyperglycemic symptoms including  polyuria, polydipsia, polyphagia, nocturia, neuropathy, blurred vision.    Current medication access support: Gets Novolog  and Ozempic  through Patient Assistance programs  Macrovascular and Microvascular Risk Reduction:  Statin? no; patient previously intolerant to  statin therapy; ACEi/ARB? yes (Olmesartan ) Last urinary albumin/creatinine ratio:  Lab Results  Component Value Date   MICRALBCREAT 331 (H) 06/23/2023   MICRALBCREAT 50 (H) 05/04/2022   MICRALBCREAT <30 02/16/2021   MICRALBCREAT <30 10/04/2019   MICRALBCREAT <30 12/29/2017   Last eye exam:  Lab Results  Component Value Date   HMDIABEYEEXA  12/08/2023     Comment:     UNABLE TO DETERMINE   Last foot exam: No foot exam found Tobacco Use:  Tobacco Use: Medium Risk (12/22/2023)   Patient History    Smoking Tobacco Use: Former    Smokeless Tobacco Use: Never    Passive Exposure: Never     Objective:  Lab Results  Component Value Date   HGBA1C 6.4 (H) 12/22/2023    Lab Results  Component Value Date   CREATININE 1.03 (H) 12/22/2023   BUN 14 12/22/2023   NA 142 12/22/2023   K 3.8 12/22/2023   CL 106 12/22/2023   CO2 22 12/22/2023    Lab Results  Component Value Date   CHOL 132 10/03/2023   HDL 44 10/03/2023   LDLCALC 56 10/03/2023   LDLDIRECT 83 06/11/2019   TRIG 199 (H) 10/03/2023   CHOLHDL 3.0 10/03/2023    Medications Reviewed Today     Reviewed by Jolee Cassius PARAS, RPH (Pharmacist) on 12/28/23 at 1527  Med List Status: <None>   Medication Order Taking? Sig Documenting Provider Last Dose Status Informant  Accu-Chek Softclix Lancets lancets [id number] Yes USE TO CHECK BLOOD SUGARS ONCE DAILY. Jarold Medici, MD  Active   albuterol  (VENTOLIN  HFA) 108 [phone] Base) MCG/ACT inhaler [id number] Yes Inhale 2 puffs into the lungs every 6 (six) hours  as needed for wheezing or shortness of breath. Kassie Acquanetta Bradley, MD  Active Self, Pharmacy Records  BD PEN NEEDLE NANO 2ND GEN 32G X 4 MM MISC [id number] Yes USE AS DIRECTED TWICE DAILY Jarold Medici, MD  Active Self, Pharmacy Records  Blood Glucose Monitoring Suppl (ACCU-CHEK GUIDE) w/Device KIT [id number] Yes USE TO CHECK BLOOD SUGARS ONCE DAILY. Jarold Medici, MD  Active Self, Pharmacy Records  budesonide -formoterol   (SYMBICORT ) 160-4.5 MCG/ACT inhaler [id number]  Inhale 2 puffs into the lungs 2 (two) times daily. Kassie Acquanetta Bradley, MD  Active Self, Pharmacy Records  Cholecalciferol (VITAMIN D3) 2000 units TABS [id number]  Take 1 tablet by mouth daily. [provider]  Active Self, Pharmacy Records  colchicine  0.6 MG tablet [id number]  Take 1 tablet (0.6 mg total) by mouth as needed (for gout). Jarold Medici, MD  Active Self, Pharmacy Records  Continuous Glucose Sensor [facility] G7 SENSOR) [place] [id number] Yes Use daily to check blood sugars. Change every 10 days. Jarold Medici, MD  Active   diltiazem  (CARDIZEM  CD) 180 MG 24 hr capsule [id number] Yes Take 1 capsule (180 mg total) by mouth daily. Jarold Medici, MD  Active   dorzolamide -timolol  (COSOPT ) 2-0.5 % ophthalmic solution [id number] Yes Place 1 drop into the right eye 2 (two) times daily. [provider]  Active Self, Pharmacy Records  ELIQUIS  5 MG TABS tablet [id number] Yes TAKE 1 TABLET(5 MG) BY MOUTH TWICE DAILY Lucien Orren SAILOR, PA-C  Active Self, Pharmacy Records   Patient not taking:   Discontinued 12/28/23 1526 (Discontinued by provider)            Med Note>> Jolee Cassius PARAS, [facility]   12/28/2023  3:26 PM caused urinary tract infections    Evolocumab  (REPATHA  SURECLICK) 140 MG/ML SOAJ [id number] Yes Inject 140 mg into the skin every 14 (fourteen) days. Thukkani, Arun K, MD  Active   fluticasone  (FLONASE ) 50 MCG/ACT nasal spray [id number] Yes Place 2 sprays into both nostrils daily. Petrina Pries, NP  Active Self, Pharmacy Records  furosemide  (LASIX ) 20 MG tablet [id number] Yes TAKE 1 TABLET(20 MG) BY MOUTH DAILY Lucien Orren SAILOR, PA-C  Active Self, Pharmacy Records  glucose blood (ACCU-CHEK GUIDE TEST) test strip [id number] Yes USE ONCE DAILT TO CHECK BLOOD SUGAR Jarold Medici, MD  Active   glucose blood (ACCU-CHEK GUIDE TEST) test strip [id number] Yes Use as instructed Jarold Medici, MD  Active   glucose blood (ACCU-CHEK GUIDE) test strip [id number]  Yes USE TO CHECK BLOOD SUGARS ONCE DAILY. Jarold Medici, MD  Active Self, Pharmacy Records  insulin  aspart protamine - aspart (NOVOLOG  MIX 70/30 FLEXPEN) (70-30) 100 UNIT/ML FlexPen [id number] Yes INJECT 40 UNITS SUBCUTANEOUS EVERY MORNING AND 50 UNITS SUBCUTANEOUS AT DINNER  Patient taking differently: INJECT 45 UNITS SUBCUTANEOUS EVERY MORNING AND 50 UNITS SUBCUTANEOUS AT JOAQUIN Jarold, Medici, MD  Active Self, Pharmacy Records  isosorbide  mononitrate (IMDUR ) 60 MG 24 hr tablet [id number] Yes Take 1 tablet (60 mg total) by mouth daily. Wyn Jackee VEAR Mickey., NP  Active   Latanoprostene Bunod  (VYZULTA ) 0.024 % LARRAINE [id number] Yes Place 1 drop into the right eye at bedtime. [provider]  Active   methocarbamol  (ROBAXIN ) 500 MG tablet [id number]  Take 1 tablet (500 mg total) by mouth every 8 (eight) hours as needed for muscle spasms. Jarold Medici, MD  Active   metoprolol  succinate (TOPROL -XL) 100 MG 24 hr tablet [id number] Yes Take 1.5 tablets (150 mg total) by mouth daily. Take with or immediately following a meal.  Thukkani, Arun K, MD  Active   nitroGLYCERIN  (NITROSTAT ) 0.4 MG SL tablet [id number] Yes Place 1 tablet (0.4 mg total) under the tongue every 5 (five) minutes as needed for chest pain. Hobart Powell BRAVO, MD  Active Self, Pharmacy Records  olmesartan  (BENICAR ) 40 MG tablet [id number] Yes TAKE 1 TABLET(40 MG) BY MOUTH DAILY Jarold Medici, MD  Active Self, Pharmacy Records  [facility] VERIO test strip [id number]  USE TO CHECK BLOOD SUGARS TWICE DAILY AS DIRECTED  Patient not taking: Reported on 12/28/2023   Jarold Medici, MD  Active Self, Pharmacy Records  pantoprazole  (PROTONIX ) 40 MG tablet [id number] Yes TAKE 1 TABLET(40 MG) BY MOUTH DAILY Petrina Pries, NP  Active   polyethylene glycol (MIRALAX ) 17 g packet [id number] Yes Take 17 g by mouth daily. Jarold Medici, MD  Active   potassium chloride  (KLOR-CON ) 10 MEQ tablet [id number] Yes Take 1 tablet (10 mEq total) by mouth at bedtime.  Jonel Lonni SQUIBB, MD  Active   pregabalin (LYRICA) 50 MG capsule [id number] Yes ONE CAPSULE PO TWICE DAILY Jarold Medici, MD  Active   Semaglutide ,0.25 or 0.5MG /DOS, (OZEMPIC , 0.25 OR 0.5 MG/DOSE,) 2 MG/1.5ML SOPN [id number] Yes INJECT 0.5MG  INTO SKIN ONCE A JANET Jarold Medici, MD  Active Self, Pharmacy Records  spironolactone  (ALDACTONE ) 25 MG tablet [id number] Yes TAKE 1 TABLET(25 MG) BY MOUTH DAILY Wyn Jackee VEAR Mickey., NP  Active   traMADol  (ULTRAM ) 50 MG tablet [id number] Yes Take 1 tablet (50 mg total) by mouth every 12 (twelve) hours as needed. Jarold Medici, MD  Active               Assessment/Plan:   Diabetes: - Currently controlled; goal A1c <7%. Cardiorenal risk reduction is optimized.. Blood pressure is at goal <130/80. LDL is at goal.   Patient was trained on how to use Dexcom G7 sensors. We reviewed the correct application technique, made sure the Dexcom app was downloaded to her phone and that she was linked to the clinic's Dexcom Clarity portal.  She brought in paperwork from [employer] stating that her application was missing the provider's portion. I contacted Luke Mall, CPhT and she will resend.   Follow Up Plan:    Follow up with Patient in 1 week to see how she is doing.   Cassius DOROTHA Brought, PharmD, BCACP Clinical Pharmacist [phone]

## 2023-12-31 DIAGNOSIS — J4489 Other specified chronic obstructive pulmonary disease: Secondary | ICD-10-CM | POA: Insufficient documentation

## 2023-12-31 NOTE — Assessment & Plan Note (Signed)
 Chronic, fair control.  Goal BP <130/80.  Heart failure with exertional dyspnea. Current medications include Cardizem , Eliquis , spironolactone , olmesartan , nitroglycerin , metoprolol , and Imdur . - Continue current medications for heart failure and hypertension. - Cardiology input is appreciated.

## 2023-12-31 NOTE — Assessment & Plan Note (Signed)
 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 Stage 3A chronic kidney disease stable. Blood sugars controlled. - Start Lyrica 50 mg twice daily for neuropathy. Monitor for sedation; adjust to nighttime if needed. - Check kidney function before increasing medication dosage.

## 2023-12-31 NOTE — Assessment & Plan Note (Signed)
 Diagnosed with asthma per Pulmonary. Likely has COPD as well given her significant smoking history. Unfortunately,she has  persistent dyspnea despite albuterol  and Symbicort .  - Consult pulmonologist on inhaler efficacy and alternative treatments. - Breztri should be considered.

## 2023-12-31 NOTE — Assessment & Plan Note (Signed)
 She is encouraged to strive for BMI less than 30 to decrease cardiac risk. Advised to aim for at least 150 minutes of exercise per week. She would likely benefit from the PREP program.

## 2023-12-31 NOTE — Assessment & Plan Note (Signed)
 Suspected acute cystitis with urinary frequency. Awaiting urine analysis results. - Analyze urine sample for infection and hematuria. - Delay flu shot until urine results are available and treatment is initiated.

## 2023-12-31 NOTE — Assessment & Plan Note (Signed)
 GERD managed with Protonix . - Continue Protonix  as prescribed.

## 2024-01-01 MED ORDER — CEPHALEXIN 500 MG PO CAPS: ORAL_CAPSULE | ORAL | 0 refills | Status: DC

## 2024-01-02 LAB — POCT URINALYSIS DIP (CLINITEK)
Bilirubin, UA: NEGATIVE
Glucose, UA: NEGATIVE mg/dL
Ketones, POC UA: NEGATIVE mg/dL
Leukocytes, UA: NEGATIVE
Nitrite, UA: NEGATIVE
POC PROTEIN,UA: >=300 — AB
Spec Grav, UA: 1.025 (ref 1.010–1.025)
Urobilinogen, UA: 0.2 U/dL
pH, UA: 5.5 (ref 5.0–8.0)

## 2024-01-02 NOTE — Addendum Note (Signed)
 Addended by: EMMITT FINE T on: 01/02/2024 10:12 AM   Modules accepted: Orders

## 2024-01-03 ENCOUNTER — Ambulatory Visit

## 2024-01-03 VITALS — BP 132/80 | HR 68 | Temp 98.1°F | Ht 64.0 in | Wt 223.0 lb

## 2024-01-03 DIAGNOSIS — Z23 Encounter for immunization: Secondary | ICD-10-CM | POA: Diagnosis not present

## 2024-01-03 NOTE — Progress Notes (Signed)
 Patient is in office today for a nurse visit for flu Immunization. Patient Injection was given in the  Left deltoid. Patient tolerated injection well.

## 2024-01-03 NOTE — Patient Instructions (Signed)

## 2024-01-06 DIAGNOSIS — M7061 Trochanteric bursitis, right hip: Secondary | ICD-10-CM | POA: Diagnosis not present

## 2024-01-06 DIAGNOSIS — M5459 Other low back pain: Secondary | ICD-10-CM | POA: Diagnosis not present

## 2024-01-12 ENCOUNTER — Other Ambulatory Visit (HOSPITAL_BASED_OUTPATIENT_CLINIC_OR_DEPARTMENT_OTHER): Payer: Self-pay

## 2024-01-12 ENCOUNTER — Encounter (HOSPITAL_BASED_OUTPATIENT_CLINIC_OR_DEPARTMENT_OTHER): Payer: Self-pay | Admitting: Pulmonary Disease

## 2024-01-12 ENCOUNTER — Ambulatory Visit (HOSPITAL_BASED_OUTPATIENT_CLINIC_OR_DEPARTMENT_OTHER): Admitting: Pulmonary Disease

## 2024-01-12 VITALS — BP 168/74 | HR 43 | Ht 65.0 in | Wt 226.8 lb

## 2024-01-12 DIAGNOSIS — J4530 Mild persistent asthma, uncomplicated: Secondary | ICD-10-CM

## 2024-01-12 DIAGNOSIS — R918 Other nonspecific abnormal finding of lung field: Secondary | ICD-10-CM

## 2024-01-12 MED ORDER — FLUTICASONE-SALMETEROL 100-50 MCG/ACT IN AEPB: 1.0000 | INHALATION_SPRAY | Freq: Two times a day (BID) | RESPIRATORY_TRACT | 5 refills | Status: DC

## 2024-01-12 NOTE — Progress Notes (Signed)
 Subjective:   PATIENT ID: Dawn Wood GENDER: female DOB: [birth year], MRN: [id number]  Chief Complaint  Patient presents with   Lung Mass    Follow up    Reason for Visit: Follow-up shortness of breath  Dawn Wood is a 78 year old female former smoker with CAD, MI in 2015 and 2024, HTN, HLD, PVD, atrial fibrillation, OSA,, DM2 who presents for follow-up  Initial consult She has had a year of shortness of breath with exertion and noticeable worsening after her heart attack in September three months ago where she had stent placed in pLAD. Associated with coughing and wheezing. Not necessarily with activity. Denies preceding illness. Denies childhood asthma and mother with asthma. Since her retirement in 08/2021 she is less active. Has shortness of breath with moving trash can. She has never been on maintenance or rescue inhalers.  05/25/23 Since our last visit she has been using the rescue inhaler with a little improvement. She continues to have shortness of breath daily and wheezing with activity. Feels limited in what she can do due to her symptoms.   01/12/24 Since our last visit she had been prescribed Symbicort  but stopped due to ineffectiveness and cost. On August visit with NP she was advised to restart symbicort  for persistent symptoms of productive cough and shortness of breath. Wheezing in the mornings. She reports that it continues to be ineffective. Uses albuterol  twice a day but unclear if effective either. She is currently on prednisone  for chronic back pain. Activity is limited for both back and lung reasons.      No data to display           Social History: Former smoker. Quit 8 years. 1/2 ppd x 45 years Previously worked intermittently in the [employer] with exposure to chemicals  Past Medical History:  Diagnosis Date   Asthma    Atrial fibrillation (HCC)    CKD (chronic kidney disease)    Coronary artery disease     Diabetes mellitus without complication (HCC)    GERD (gastroesophageal reflux disease)    Heart attack (HCC) 12/2022   Hypertension    MI (myocardial infarction) (HCC)    Peripheral vascular disease    Sleep apnea      Family History  Problem Relation Age of Onset   Hypertension Mother    Hypertension Father    Throat cancer Father    Breast cancer Sister 72   Aortic stenosis Daughter    Heart disease Daughter        before age 42   Thyroid  disease Daughter    Sleep apnea Neg Hx      Social History   Occupational History   Not on file  Tobacco Use   Smoking status: Former    Current packs/day: 0.00    Average packs/day: 0.3 packs/day for 45.0 years (11.3 ttl pk-yrs)    Types: Cigarettes    Start date: 01/11/1968    Quit date: 01/10/2013    Years since quitting: 11.0    Passive exposure: Never   Smokeless tobacco: Never   Tobacco comments:    Former smoker 06/24/23  Vaping Use   Vaping status: Never Used  Substance and Sexual Activity   Alcohol use: Not Currently    Alcohol/week: 0.0 standard drinks of alcohol   Drug use: No   Sexual activity: Not Currently    Allergies  Allergen Reactions   Dust Mite Extract Other (See Comments)  REACTION: SNEEZING   Other Other (See Comments)    ALLERGEN: DEODORIZERS SPRAY AND PERFUMES REACTION: SNEEZING   Statins Other (See Comments)    Muscle aches with rosuvastatin  5 mg daily and 3x/weekly, atorvastatin  20 mg daily and 3x/week, simvastatin 40 mg daily    Allopurinol      Diarrhea and the pt said it made her sugars low   Pollen Extract Other (See Comments)    Reaction unknown     Outpatient Medications Prior to Visit  Medication Sig Dispense Refill   Accu-Chek Softclix Lancets lancets USE TO CHECK BLOOD SUGARS ONCE DAILY. 100 each 12   albuterol  (VENTOLIN  HFA) 108 (90 Base) MCG/ACT inhaler Inhale 2 puffs into the lungs every 6 (six) hours as needed for wheezing or shortness of breath. 8 g 2   BD PEN NEEDLE NANO  2ND GEN 32G X 4 MM MISC USE AS DIRECTED TWICE DAILY 100 each 2   Blood Glucose Monitoring Suppl (ACCU-CHEK GUIDE) w/Device KIT USE TO CHECK BLOOD SUGARS ONCE DAILY. 1 kit 0   cephALEXin  (KEFLEX ) 500 MG capsule One tab po every 12 hours 14 capsule 0   Cholecalciferol (VITAMIN D3) 2000 units TABS Take 1 tablet by mouth daily.     colchicine  0.6 MG tablet Take 1 tablet (0.6 mg total) by mouth as needed (for gout). 30 tablet 1   Continuous Glucose Sensor (DEXCOM G7 SENSOR) MISC Use daily to check blood sugars. Change every 10 days. 4 each 2   diltiazem  (CARDIZEM  CD) 180 MG 24 hr capsule Take 1 capsule (180 mg total) by mouth daily. 90 capsule 1   dorzolamide -timolol  (COSOPT ) 2-0.5 % ophthalmic solution Place 1 drop into the right eye 2 (two) times daily.     ELIQUIS  5 MG TABS tablet TAKE 1 TABLET(5 MG) BY MOUTH TWICE DAILY 180 tablet 1   Evolocumab  (REPATHA  SURECLICK) 140 MG/ML SOAJ Inject 140 mg into the skin every 14 (fourteen) days. 6 mL 3   fluticasone  (FLONASE ) 50 MCG/ACT nasal spray Place 2 sprays into both nostrils daily. 16 g 2   furosemide  (LASIX ) 20 MG tablet TAKE 1 TABLET(20 MG) BY MOUTH DAILY 30 tablet 10   glucose blood (ACCU-CHEK GUIDE TEST) test strip USE ONCE DAILT TO CHECK BLOOD SUGAR 100 strip 2   glucose blood (ACCU-CHEK GUIDE TEST) test strip Use as instructed 100 each 12   glucose blood (ACCU-CHEK GUIDE) test strip USE TO CHECK BLOOD SUGARS ONCE DAILY. 100 each 12   insulin  aspart protamine - aspart (NOVOLOG  MIX 70/30 FLEXPEN) (70-30) 100 UNIT/ML FlexPen INJECT 40 UNITS SUBCUTANEOUS EVERY MORNING AND 50 UNITS SUBCUTANEOUS AT DINNER 30 mL 2   isosorbide  mononitrate (IMDUR ) 60 MG 24 hr tablet Take 1 tablet (60 mg total) by mouth daily. 90 tablet 2   Latanoprostene Bunod  (VYZULTA ) 0.024 % SOLN Place 1 drop into the right eye at bedtime.     methocarbamol  (ROBAXIN ) 500 MG tablet Take 1 tablet (500 mg total) by mouth every 8 (eight) hours as needed for muscle spasms. 30 tablet 0    metoprolol  succinate (TOPROL -XL) 100 MG 24 hr tablet Take 1.5 tablets (150 mg total) by mouth daily. Take with or immediately following a meal. 135 tablet 2   nitroGLYCERIN  (NITROSTAT ) 0.4 MG SL tablet Place 1 tablet (0.4 mg total) under the tongue every 5 (five) minutes as needed for chest pain. 25 tablet 11   olmesartan  (BENICAR ) 40 MG tablet TAKE 1 TABLET(40 MG) BY MOUTH DAILY 90 tablet 2   pantoprazole  (  PROTONIX ) 40 MG tablet TAKE 1 TABLET(40 MG) BY MOUTH DAILY 90 tablet 1   polyethylene glycol (MIRALAX ) 17 g packet Take 17 g by mouth daily. 30 each 0   potassium chloride  (KLOR-CON ) 10 MEQ tablet Take 1 tablet (10 mEq total) by mouth at bedtime.     pregabalin (LYRICA) 50 MG capsule ONE CAPSULE PO TWICE DAILY 60 capsule 2   Semaglutide ,0.25 or 0.5MG /DOS, (OZEMPIC , 0.25 OR 0.5 MG/DOSE,) 2 MG/1.5ML SOPN INJECT 0.5MG  INTO SKIN ONCE A WEEK 4.5 mL 3   spironolactone  (ALDACTONE ) 25 MG tablet TAKE 1 TABLET(25 MG) BY MOUTH DAILY 90 tablet 1   budesonide -formoterol  (SYMBICORT ) 160-4.5 MCG/ACT inhaler Inhale 2 puffs into the lungs 2 (two) times daily. 10.2 g 11   ONETOUCH VERIO test strip USE TO CHECK BLOOD SUGARS TWICE DAILY AS DIRECTED (Patient not taking: Reported on 12/28/2023) 100 strip 3   traMADol  (ULTRAM ) 50 MG tablet Take 1 tablet (50 mg total) by mouth every 12 (twelve) hours as needed. (Patient not taking: Reported on 01/12/2024) 20 tablet 0   No facility-administered medications prior to visit.    Review of Systems  Constitutional:  Negative for chills, diaphoresis, fever, malaise/fatigue and weight loss.  HENT:  Negative for congestion.   Respiratory:  Positive for cough, sputum production and shortness of breath. Negative for hemoptysis and wheezing.   Cardiovascular:  Negative for chest pain, palpitations and leg swelling.     Objective:   Vitals:   01/12/24 1301  BP: (!) 168/74  Pulse: (!) 43  SpO2: 98%  Weight: 226 lb 12.8 oz (102.9 kg)  Height: 5' 5 (1.651 m)    SpO2:  98 %  Physical Exam: General: Well-appearing, no acute distress HENT: [place], AT Eyes: EOMI, no scleral icterus Respiratory: Clear to auscultation bilaterally.  No crackles, wheezing or rales Cardiovascular: RRR, -M/R/G, no JVD Extremities:-Edema,-tenderness Neuro: AAO x4, CNII-XII grossly intact Psych: Normal mood, normal affect   Data Reviewed:  Imaging: CT Chest 08/13/22 - Mild peripheral subpleural reticulation at lung bases. Parenchymal banding identified within lingular and lung bases  CT Chest 11/27/23 - Stable bilateral pulmonary nodules with largest 2 mm. No new pulmonary nodules or masses. Mild LUL atelectasis. Enlargement of pulmonary artery.  PFT: 05/25/23 FVC 1.69 (61%) FEV1 1.41 (68%) Ratio 83  TLC 85% DLCO 70%. Significant bronchodilator response Interpretation: No obstructive defect or restrictive defect. Reduced FVC and FEV1. Significant bronchodilator response suggestive of reversible obstructive disease such as asthma. Mildly reduced DLCO  Labs: CBC    Component Value Date/Time   WBC 11.9 (H) 06/16/2023 0010   RBC 5.37 (H) 06/16/2023 0010   HGB 14.7 06/16/2023 0010   HGB 13.8 04/21/2023 1226   HCT 46.2 (H) 06/16/2023 0010   HCT 44.6 04/21/2023 1226   PLT 252 06/16/2023 0010   PLT 281 04/21/2023 1226   MCV 86.0 06/16/2023 0010   MCV 87 04/21/2023 1226   MCV 85 05/26/2013 2128   MCH 27.4 06/16/2023 0010   MCHC 31.8 06/16/2023 0010   RDW 15.3 06/16/2023 0010   RDW 13.8 04/21/2023 1226   RDW 14.5 05/26/2013 2128   LYMPHSABS 1.4 11/30/2022 1635   MONOABS 0.9 11/30/2022 1635   EOSABS 0.2 11/30/2022 1635   BASOSABS 0.0 11/30/2022 1635     Sleep: HST 05/25/22 - 12.5/h PAP titration 10/25/22 - AutoPAP 9 cm with EPR 2 recommended    Assessment & Plan:   Discussion: 78 year old female former smoker with CAD, MI in 2015 and 2024, HTN,  HLD, PVD, atrial fibrillation, OSA,, DM2 who presents for follow-up shortness of breath. Suspect multifactorial in setting of  recent cardiac issues and deconditioning. Recent weight gain may be contributing. Unclear if she has underlying lung disease with minimal bronchitic symptoms.   Reviewed PFT results. Consistent with asthma. Has not been consistent with inhaler use and unclear if proper technique. Provided inhaler teaching. Alternative would be nebulized medications which patient declined. We discussed different type of inhalers including DPI vs MDI. She would like to try DPI. If unsuccessful, will retrial brand name Symbicort  with spacer.   Assessment & Plan Mild persistent asthma without complication --STOP brand name Symbicort  --START Wixela ONE puff in the morning and evening. Rinse mouth out after use --CONTINUE Albuterol  1-2 puffs AS NEEDED every 4-6 hours Multiple lung nodules --Reviewed CT Chest 11/22/23. Subcentimeter nodules stable <8mm. --No further imaging indicated  Health Maintenance Immunization History  Administered Date(s) Administered   Fluad Quad(high Dose 65+) 04/06/2021, 12/30/2021   Fluad Trivalent(High Dose 65+) 04/21/2023   INFLUENZA, HIGH DOSE SEASONAL PF 01/03/2024   Janssen (J&J) SARS-COV-2 Vaccination 05/25/2019   PFIZER(Purple Top)SARS-COV-2 Vaccination 02/21/2020   Tdap 11/04/2017   CT Lung Screen - not qualified. Insufficient tobacco history  No orders of the defined types were placed in this encounter.  Meds ordered this encounter  Medications   fluticasone -salmeterol (WIXELA INHUB) 100-50 MCG/ACT AEPB    Sig: Inhale 1 puff into the lungs 2 (two) times daily.    Dispense:  60 each    Refill:  5    Return in about 2 months (around 03/13/2024).  I have spent a total time of 30-minutes on the day of the appointment including chart review, data review, collecting history, coordinating care and discussing medical diagnosis and plan with the patient/family. Past medical history, allergies, medications were reviewed. Pertinent imaging, labs and tests included in this note  have been reviewed and interpreted independently by me.  Jayley Hustead Slater Staff, MD  Pulmonary Critical Care 01/12/2024 2:11 PM

## 2024-01-12 NOTE — Patient Instructions (Signed)
 Mild persistent asthma without complication --STOP brand name Symbicort  --START Wixela ONE puff in the morning and evening. Rinse mouth out after use --CONTINUE Albuterol  1-2 puffs AS NEEDED every 4-6 hours

## 2024-01-13 NOTE — Telephone Encounter (Signed)
 Patient called to confirm needles would be with her Ozempic  medication. Patient advised per CAL that needles would be with it and to arrive before 11:30 am because the office closes at that time.

## 2024-01-15 ENCOUNTER — Other Ambulatory Visit: Payer: Self-pay | Admitting: Internal Medicine

## 2024-01-25 ENCOUNTER — Ambulatory Visit (HOSPITAL_BASED_OUTPATIENT_CLINIC_OR_DEPARTMENT_OTHER): Admitting: Pulmonary Disease

## 2024-01-25 DIAGNOSIS — M545 Low back pain, unspecified: Secondary | ICD-10-CM | POA: Diagnosis not present

## 2024-01-26 ENCOUNTER — Telehealth: Payer: Self-pay | Admitting: Internal Medicine

## 2024-01-26 MED ORDER — NITROGLYCERIN 0.4 MG SL SUBL: 0.4000 mg | SUBLINGUAL_TABLET | SUBLINGUAL | 0 refills | Status: AC | PRN

## 2024-01-26 NOTE — Telephone Encounter (Signed)
 Refill sent.

## 2024-01-26 NOTE — Telephone Encounter (Signed)
*  STAT* If patient is at the pharmacy, call can be transferred to refill team.   1. Which medications need to be refilled? (please list name of each medication and dose if known)   nitroGLYCERIN  (NITROSTAT ) 0.4 MG SL tablet     2. Would you like to learn more about the convenience, safety, & potential cost savings by using the [facility]? No    3. Are you open to using the [facility] (Type [facility]. No    4. Which pharmacy/location (including street and city if local pharmacy) is medication to be sent to? [id number] - [place], [place] - [address] AT [address]     5. Do they need a 30 day or 90 day supply? 1 vial   Pt out of medication

## 2024-01-31 ENCOUNTER — Encounter: Payer: Self-pay | Admitting: Internal Medicine

## 2024-01-31 ENCOUNTER — Ambulatory Visit: Payer: Self-pay

## 2024-01-31 ENCOUNTER — Ambulatory Visit (INDEPENDENT_AMBULATORY_CARE_PROVIDER_SITE_OTHER): Admitting: Internal Medicine

## 2024-01-31 VITALS — BP 142/76 | HR 58 | Temp 98.9°F | Ht 65.0 in | Wt 227.8 lb

## 2024-01-31 DIAGNOSIS — M1A09X0 Idiopathic chronic gout, multiple sites, without tophus (tophi): Secondary | ICD-10-CM | POA: Diagnosis not present

## 2024-01-31 DIAGNOSIS — I4891 Unspecified atrial fibrillation: Secondary | ICD-10-CM | POA: Diagnosis not present

## 2024-01-31 DIAGNOSIS — R1319 Other dysphagia: Secondary | ICD-10-CM | POA: Diagnosis not present

## 2024-01-31 DIAGNOSIS — R600 Localized edema: Secondary | ICD-10-CM

## 2024-01-31 DIAGNOSIS — I5032 Chronic diastolic (congestive) heart failure: Secondary | ICD-10-CM | POA: Diagnosis not present

## 2024-01-31 DIAGNOSIS — I130 Hypertensive heart and chronic kidney disease with heart failure and stage 1 through stage 4 chronic kidney disease, or unspecified chronic kidney disease: Secondary | ICD-10-CM | POA: Diagnosis not present

## 2024-01-31 DIAGNOSIS — I25118 Atherosclerotic heart disease of native coronary artery with other forms of angina pectoris: Secondary | ICD-10-CM | POA: Diagnosis not present

## 2024-01-31 DIAGNOSIS — R002 Palpitations: Secondary | ICD-10-CM | POA: Diagnosis not present

## 2024-01-31 NOTE — Patient Instructions (Signed)
 Peripheral Edema  Peripheral edema is swelling that is caused by a buildup of fluid. Peripheral edema most often affects the lower legs, ankles, and feet. It can also develop in the arms, hands, and face. The area of the body that has peripheral edema will look swollen. It may also feel heavy or warm. Your clothes may start to feel tight. Pressing on the area may make a temporary dent in your skin (pitting edema). You may not be able to move your swollen arm or leg as much as usual. There are many causes of peripheral edema. It can happen because of a complication of other conditions such as heart failure, kidney disease, or a problem with your circulation. It also can be a side effect of certain medicines or happen because of an infection. It often happens to women during pregnancy. Sometimes, the cause is not known. Follow these instructions at home: Managing pain, stiffness, and swelling  Raise (elevate) your legs while you are sitting or lying down. Move around often to prevent stiffness and to reduce swelling. Do not sit or stand for long periods of time. Do not wear tight clothing. Do not wear garters on your upper legs. Exercise your legs to get your circulation going. This helps to move the fluid back into your blood vessels, and it may help the swelling go down. Wear compression stockings as told by your health care provider. These stockings help to prevent blood clots and reduce swelling in your legs. It is important that these are the correct size. These stockings should be prescribed by your doctor to prevent possible injuries. If elastic bandages or wraps are recommended, use them as told by your health care provider. Medicines Take over-the-counter and prescription medicines only as told by your health care provider. Your health care provider may prescribe medicine to help your body get rid of excess water (diuretic). Take this medicine if you are told to take it. General  instructions Eat a low-salt (low-sodium) diet as told by your health care provider. Sometimes, eating less salt may reduce swelling. Pay attention to any changes in your symptoms. Moisturize your skin daily to help prevent skin from cracking and draining. Keep all follow-up visits. This is important. Contact a health care provider if: You have a fever. You have swelling in only one leg. You have increased swelling, redness, or pain in one or both of your legs. You have drainage or sores at the area where you have edema. Get help right away if: You have edema that starts suddenly or is getting worse, especially if you are pregnant or have a medical condition. You develop shortness of breath, especially when you are lying down. You have pain in your chest or abdomen. You feel weak. You feel like you will faint. These symptoms may be an emergency. Get help right away. Call 911. Do not wait to see if the symptoms will go away. Do not drive yourself to the hospital. Summary Peripheral edema is swelling that is caused by a buildup of fluid. Peripheral edema most often affects the lower legs, ankles, and feet. Move around often to prevent stiffness and to reduce swelling. Do not sit or stand for long periods of time. Pay attention to any changes in your symptoms. Contact a health care provider if you have edema that starts suddenly or is getting worse, especially if you are pregnant or have a medical condition. Get help right away if you develop shortness of breath, especially when lying down.  This information is not intended to replace advice given to you by your health care provider. Make sure you discuss any questions you have with your health care provider. Document Revised: 10/27/2020 Document Reviewed: 10/27/2020 Elsevier Patient Education  2024 [employer].

## 2024-01-31 NOTE — Telephone Encounter (Signed)
 [phone] cell phone   Patient thinks that her feet and ankles are swelling due to a medication that her PCP put her on for neuropathy is causing this ---she states that she looked up the side effects and these are some of them pregabalin   FYI Only or Action Required?: FYI only for provider: patient warm transferred to Victoria at [facility] for further assistance at this time.  Patient was last seen in primary care on 12/22/2023 by Jarold Medici, MD.  Called Nurse Triage reporting Foot Swelling.  Symptoms began unknown.  Interventions attempted: Other: unknown.  Symptoms are: unknown.                Copied from CRM [id number]. Topic: Clinical - Red Word Triage >> Jan 31, 2024 10:41 AM [place] MATSU wrote: Red Word that prompted transfer to Nurse Triage: patient thinks she's having a reaction to a medication per patient she is having swelling. Answer Assessment - Initial Assessment Questions [phone] cell phone   Patient thinks that her feet and ankles are swelling due to a medication that her PCP put her on for neuropathy is causing this ---she states that she looked up the side effects and these are some of them Pregabalin   Patient gave limited information due to having to speak with a Triage Nurse and she states she didn't know why she had to go through this triage process. Patient denied any symptoms at this time during triage except for the feet and ankle swelling and states that she just wants to see her PCP Dr Medici Jarold This RN explained the triage process to her and patient was understanding. This RN called [facility] and spoke with Victoria who knows the patient. This RN advised the patient's concerns and patient was warm transferred to Victoria at the PCP office for further assistance at this time.  Protocols used: Leg Swelling and Edema-A-AH

## 2024-01-31 NOTE — Progress Notes (Signed)
 I,Victoria T Emmitt, CMA,acting as a neurosurgeon for Catheryn LOISE Slocumb, MD.,have documented all relevant documentation on the behalf of Catheryn LOISE Slocumb, MD,as directed by  Catheryn LOISE Slocumb, MD while in the presence of Catheryn LOISE Slocumb, MD.  Subjective:  Patient ID: Dawn Wood , female    DOB: Jul 22, 1945 , 78 y.o.   MRN: [id number]  Chief Complaint  Patient presents with   Joint Swelling    Patient presents today for swollen ankles & feet. She states these side effects started after starting pregabalin . She discontinued medication a week ago. Patient experiencing SOB today states getting worse. Complains of rapid heartbeat.   Foot Swelling    HPI Discussed the use of AI scribe software for clinical note transcription with the patient, who gave verbal consent to proceed.  History of Present Illness Dawn Wood is a 78 year old female with heart disease and asthma who presents with swelling in her feet.  She experiences chronic swelling in her feet, which has worsened recently. She uses compression hose, though she is uncertain if they are medical grade. She suspects Lyrica  may be contributing to the swelling. She has not used colchicine  recently, as she initially thought the swelling was due to gout but now believes otherwise. Her toe is sore.  She has a history of heart disease with a stent placement earlier this year. She experiences shortness of breath and wheezing. She uses albuterol  as needed and has switched from Symbicort  to [place], a powder inhaler. She has difficulty breathing during physical activities and even when putting on her compression socks.  She has diabetes, and her blood sugar levels were affected by prednisone , prescribed for back pain by an orthopedic doctor. The prednisone  did not alleviate her back pain but caused her blood sugar to rise, necessitating an increase in her insulin  dosage. She recently stopped taking prednisone .  She experienced a rapid  heartbeat last week, a new occurrence for her. She has a history of atrial fibrillation and is not currently taking magnesium  supplements, despite having low magnesium  levels earlier this year.  She is involved in the care of her 37 year old mother, preparing meals and responding to early morning calls. Her mother is resistant to having a personal care assistant and prefers to remain independent.   Hypertension This is a chronic problem. The current episode started more than 1 year ago. The problem has been gradually improving since onset. Associated symptoms include palpitations. Risk factors for coronary artery disease include diabetes mellitus, dyslipidemia, sedentary lifestyle and post-menopausal state. Hypertensive end-organ damage includes kidney disease.     Past Medical History:  Diagnosis Date   Asthma    Atrial fibrillation (HCC)    CKD (chronic kidney disease)    Coronary artery disease    Diabetes mellitus without complication (HCC)    GERD (gastroesophageal reflux disease)    Heart attack (HCC) 12/2022   Hypertension    MI (myocardial infarction) (HCC)    Peripheral vascular disease    Sleep apnea      Family History  Problem Relation Age of Onset   Hypertension Mother    Hypertension Father    Throat cancer Father    Breast cancer Sister 51   Aortic stenosis Daughter    Heart disease Daughter        before age 40   Thyroid  disease Daughter    Sleep apnea Neg Hx      Current Outpatient Medications:    Accu-Chek Softclix Lancets lancets,  USE TO CHECK BLOOD SUGARS ONCE DAILY., Disp: 100 each, Rfl: 12   albuterol  (VENTOLIN  HFA) 108 (90 Base) MCG/ACT inhaler, Inhale 2 puffs into the lungs every 6 (six) hours as needed for wheezing or shortness of breath., Disp: 8 g, Rfl: 2   BD PEN NEEDLE NANO 2ND GEN 32G X 4 MM MISC, USE AS DIRECTED TWICE DAILY, Disp: 100 each, Rfl: 2   Blood Glucose Monitoring Suppl (ACCU-CHEK GUIDE) w/Device KIT, USE TO CHECK BLOOD SUGARS ONCE  DAILY., Disp: 1 kit, Rfl: 0   Cholecalciferol (VITAMIN D3) 2000 units TABS, Take 1 tablet by mouth daily., Disp: , Rfl:    colchicine  0.6 MG tablet, TAKE 1 TABLET BY MOUTH ONCE DAILY AS NEEDED FOR GOUT, Disp: 30 tablet, Rfl: 1   Continuous Glucose Sensor (DEXCOM G7 SENSOR) MISC, Use daily to check blood sugars. Change every 10 days., Disp: 4 each, Rfl: 2   diltiazem  (CARDIZEM  CD) 180 MG 24 hr capsule, Take 1 capsule (180 mg total) by mouth daily., Disp: 90 capsule, Rfl: 1   dorzolamide -timolol  (COSOPT ) 2-0.5 % ophthalmic solution, Place 1 drop into the right eye 2 (two) times daily., Disp: , Rfl:    Evolocumab  (REPATHA  SURECLICK) 140 MG/ML SOAJ, Inject 140 mg into the skin every 14 (fourteen) days., Disp: 6 mL, Rfl: 3   fluticasone  (FLONASE ) 50 MCG/ACT nasal spray, Place 2 sprays into both nostrils daily., Disp: 16 g, Rfl: 2   fluticasone -salmeterol (WIXELA INHUB) 100-50 MCG/ACT AEPB, Inhale 1 puff into the lungs 2 (two) times daily., Disp: 60 each, Rfl: 5   furosemide  (LASIX ) 20 MG tablet, TAKE 1 TABLET(20 MG) BY MOUTH DAILY, Disp: 30 tablet, Rfl: 10   glucose blood (ACCU-CHEK GUIDE TEST) test strip, USE ONCE DAILT TO CHECK BLOOD SUGAR, Disp: 100 strip, Rfl: 2   glucose blood (ACCU-CHEK GUIDE TEST) test strip, Use as instructed, Disp: 100 each, Rfl: 12   glucose blood (ACCU-CHEK GUIDE) test strip, USE TO CHECK BLOOD SUGARS ONCE DAILY., Disp: 100 each, Rfl: 12   insulin  aspart protamine - aspart (NOVOLOG  MIX 70/30 FLEXPEN) (70-30) 100 UNIT/ML FlexPen, INJECT 40 UNITS SUBCUTANEOUS EVERY MORNING AND 50 UNITS SUBCUTANEOUS AT DINNER, Disp: 30 mL, Rfl: 2   isosorbide  mononitrate (IMDUR ) 60 MG 24 hr tablet, Take 1 tablet (60 mg total) by mouth daily., Disp: 90 tablet, Rfl: 2   Latanoprostene Bunod  (VYZULTA ) 0.024 % SOLN, Place 1 drop into the right eye at bedtime., Disp: , Rfl:    methocarbamol  (ROBAXIN ) 500 MG tablet, Take 1 tablet (500 mg total) by mouth every 8 (eight) hours as needed for muscle  spasms., Disp: 30 tablet, Rfl: 0   metoprolol  succinate (TOPROL -XL) 100 MG 24 hr tablet, Take 1.5 tablets (150 mg total) by mouth daily. Take with or immediately following a meal., Disp: 135 tablet, Rfl: 2   nitroGLYCERIN  (NITROSTAT ) 0.4 MG SL tablet, Place 1 tablet (0.4 mg total) under the tongue every 5 (five) minutes as needed for chest pain., Disp: 25 tablet, Rfl: 0   olmesartan  (BENICAR ) 40 MG tablet, TAKE 1 TABLET(40 MG) BY MOUTH DAILY, Disp: 90 tablet, Rfl: 2   pantoprazole  (PROTONIX ) 40 MG tablet, TAKE 1 TABLET(40 MG) BY MOUTH DAILY, Disp: 90 tablet, Rfl: 1   polyethylene glycol (MIRALAX ) 17 g packet, Take 17 g by mouth daily., Disp: 30 each, Rfl: 0   potassium chloride  (KLOR-CON ) 10 MEQ tablet, Take 1 tablet (10 mEq total) by mouth at bedtime., Disp: , Rfl:    Semaglutide ,0.25 or 0.5MG /DOS, (OZEMPIC , 0.25  OR 0.5 MG/DOSE,) 2 MG/1.5ML SOPN, INJECT 0.5MG  INTO SKIN ONCE A WEEK, Disp: 4.5 mL, Rfl: 3   ELIQUIS  5 MG TABS tablet, TAKE ONE TABLET BY MOUTH TWICE DAILY, Disp: 180 tablet, Rfl: 1   spironolactone  (ALDACTONE ) 25 MG tablet, Take 1 tablet (25 mg total) by mouth daily., Disp: 90 tablet, Rfl: 0   Allergies  Allergen Reactions   Dust Mite Extract Other (See Comments)    REACTION: SNEEZING   Other Other (See Comments)    ALLERGEN: DEODORIZERS SPRAY AND PERFUMES REACTION: SNEEZING   Statins Other (See Comments)    Muscle aches with rosuvastatin  5 mg daily and 3x/weekly, atorvastatin  20 mg daily and 3x/week, simvastatin 40 mg daily    Allopurinol      Diarrhea and the pt said it made her sugars low   Pollen Extract Other (See Comments)    Reaction unknown     Review of Systems  Constitutional: Negative.   Respiratory: Negative.    Cardiovascular:  Positive for palpitations and leg swelling.  Neurological: Negative.   Psychiatric/Behavioral: Negative.       Today's Vitals   01/31/24 1552 01/31/24 1613  BP: (!) 140/70 (!) 142/76  Pulse: (!) 58   Temp: 98.9 F (37.2 C)    SpO2: 98%   Weight: 227 lb 12.8 oz (103.3 kg)   Height: 5' 5 (1.651 m)    Body mass index is 37.91 kg/m.  Wt Readings from Last 3 Encounters:  01/31/24 227 lb 12.8 oz (103.3 kg)  01/12/24 226 lb 12.8 oz (102.9 kg)  01/03/24 223 lb (101.2 kg)    The ASCVD Risk score (Arnett DK, et al., 2019) failed to calculate for the following reasons:   Risk score cannot be calculated because patient has a medical history suggesting prior/existing ASCVD  Objective:  Physical Exam Vitals and nursing note reviewed.  Constitutional:      Appearance: Normal appearance. She is obese.  HENT:     Head: Normocephalic and atraumatic.  Eyes:     Extraocular Movements: Extraocular movements intact.  Cardiovascular:     Rate and Rhythm: Normal rate and regular rhythm.     Heart sounds: Normal heart sounds.  Pulmonary:     Effort: Pulmonary effort is normal.     Breath sounds: Normal breath sounds.  Musculoskeletal:     Cervical back: Normal range of motion.     Right lower leg: Edema present.     Left lower leg: Edema present.  Skin:    General: Skin is warm.  Neurological:     General: No focal deficit present.     Mental Status: She is alert.  Psychiatric:        Mood and Affect: Mood normal.        Behavior: Behavior normal.      Assessment And Plan:   Assessment & Plan Peripheral edema Chronic bilateral foot swelling, possibly exacerbated by Lyrica . Differential includes cardiac and renal causes. - Ordered blood work to assess cardiac and renal function. - Stopped Lyrica . - Advised to stay hydrated, elevate legs, and continue wearing compression hose. - Advised to monitor salt intake. Hypertensive heart and renal disease with renal failure, stage 1 through stage 4 or unspecified chronic kidney disease, with heart failure (HCC) Chronic, uncontrolled.  Chronic diastolic heart failure with impaired relaxation, likely secondary to long-standing hypertension. Recent echocardiogram showed  good systolic function but impaired relaxation. Blood pressure management is crucial. - Rechecked blood pressure. - Continue olmesartan  40 mg daily. -  Ordered blood work to assess kidney function. - Follow low sodium diet.  Chronic diastolic heart failure (HCC) Chronic, importance of dietary and medication compliance was discussed with the patient.  Coronary artery disease of native artery of native heart with stable angina pectoris Atherosclerotic heart disease with previous stent placement. Shortness of breath likely multifactorial, including cardiac, pulmonary, and deconditioning components. Cardiac rehab is recommended to improve conditioning. - Recommended cardiac rehab. - Continue albuterol  as needed for wheezing. - Encouraged physical activity to improve conditioning. Idiopathic chronic gout of multiple sites without tophus Chronic gout with previous intolerance to certain medications. - Will recheck gout medication regimen. Palpitations Recent episode of palpitations, possibly related to dehydration or medication changes. Previous low magnesium  levels noted. No recent cardiac testing in two years. - Ordered magnesium  level. - Ensure adequate hydration. - Monitor for recurrence of palpitations. Atrial flutter with RVR (HCC) Chronic, rate controlled.  Continue diltiazem  and metoprolol  for heart rate control. - Continue Eliquis  for anticoagulation. - Recommend cardiac rehab before pulmonary rehab  Other dysphagia She has sporadic symptoms. Consider imaging if her sx worsen. - Encouraged to chew food thoroughly prior to swallowing.   - Continue with pantoprazole  to address GERD - Consider barium swallow if sx persist/worsen.     Orders Placed This Encounter  Procedures   BMP8+EGFR   Brain natriuretic peptide   Magnesium    Uric acid     Return if symptoms worsen or fail to improve.  Patient was given opportunity to ask questions. Patient verbalized understanding of the  plan and was able to repeat key elements of the plan. All questions were answered to their satisfaction.    I, Catheryn LOISE Slocumb, MD, have reviewed all documentation for this visit. The documentation on 01/31/24 for the exam, diagnosis, procedures, and orders are all accurate and complete.   IF YOU HAVE BEEN REFERRED TO A SPECIALIST, IT MAY TAKE 1-2 WEEKS TO SCHEDULE/PROCESS THE REFERRAL. IF YOU HAVE NOT HEARD FROM US /SPECIALIST IN TWO WEEKS, PLEASE GIVE US  A CALL AT [phone].

## 2024-02-01 ENCOUNTER — Encounter: Payer: Self-pay | Admitting: Pharmacist

## 2024-02-01 DIAGNOSIS — M5459 Other low back pain: Secondary | ICD-10-CM | POA: Diagnosis not present

## 2024-02-01 DIAGNOSIS — M545 Low back pain, unspecified: Secondary | ICD-10-CM | POA: Diagnosis not present

## 2024-02-01 NOTE — Progress Notes (Signed)
   02/01/2024  Patient ID: Dawn Wood, female   DOB: January 17, 1946, 78 y.o.   MRN: [id number]  Pharmacy Quality Measure Review  This patient is appearing on a report for being at risk of failing the adherence measure for diabetes medications this calendar year.   Medication: Jardiance  10 mg  Last fill date: 08/25 for 90 day supply  Jaridance was discontinued due to yeast infections.   Cassius DOROTHA Brought, PharmD, BCACP Clinical Pharmacist [phone]

## 2024-02-03 ENCOUNTER — Other Ambulatory Visit: Payer: Self-pay | Admitting: Physician Assistant

## 2024-02-03 DIAGNOSIS — I480 Paroxysmal atrial fibrillation: Secondary | ICD-10-CM

## 2024-02-03 LAB — BMP8+EGFR
BUN/Creatinine Ratio: 14 (ref 12–28)
BUN: 17 mg/dL (ref 8–27)
CO2: 24 mmol/L (ref 20–29)
Calcium: 8.7 mg/dL (ref 8.7–10.3)
Chloride: 104 mmol/L (ref 96–106)
Creatinine, Ser: 1.19 mg/dL — ABNORMAL HIGH (ref 0.57–1.00)
Glucose: 83 mg/dL (ref 70–99)
Potassium: 4 mmol/L (ref 3.5–5.2)
Sodium: 142 mmol/L (ref 134–144)
eGFR: 47 mL/min/1.73 — ABNORMAL LOW (ref >59)

## 2024-02-03 LAB — URIC ACID: Uric Acid: 7.1 mg/dL (ref 3.1–7.9)

## 2024-02-03 LAB — MAGNESIUM: Magnesium: 1.8 mg/dL (ref 1.6–2.3)

## 2024-02-03 LAB — BRAIN NATRIURETIC PEPTIDE: BNP: 305.3 pg/mL — ABNORMAL HIGH (ref 0.0–100.0)

## 2024-02-05 ENCOUNTER — Ambulatory Visit: Payer: Self-pay | Admitting: Internal Medicine

## 2024-02-06 ENCOUNTER — Telehealth: Payer: Self-pay | Admitting: Internal Medicine

## 2024-02-06 ENCOUNTER — Other Ambulatory Visit: Payer: Self-pay | Admitting: Internal Medicine

## 2024-02-06 ENCOUNTER — Other Ambulatory Visit: Payer: Self-pay

## 2024-02-06 ENCOUNTER — Ambulatory Visit: Attending: Internal Medicine

## 2024-02-06 DIAGNOSIS — Z79899 Other long term (current) drug therapy: Secondary | ICD-10-CM

## 2024-02-06 DIAGNOSIS — I10 Essential (primary) hypertension: Secondary | ICD-10-CM

## 2024-02-06 DIAGNOSIS — R600 Localized edema: Secondary | ICD-10-CM

## 2024-02-06 DIAGNOSIS — I480 Paroxysmal atrial fibrillation: Secondary | ICD-10-CM

## 2024-02-06 NOTE — Telephone Encounter (Signed)
 I called the patient and she stated that occasionally she gets tingling in her arms and gets hot/sweaty. She stated that it occurs when she feels her heart rate increase or when she is having something to eat or drink. She denies any symptoms currently. She is not having any chest pain, shortness of breath besides her asthma, swelling, loss of consciousness or dizziness. She is taking all of her medications as prescribed. She does not monitor her blood pressure daily but she did check while we were on the phone and her blood pressure was at 156/79 with a pulse of 53. I gave ED precautions, advised to stay hydrated and avoid salty foods. I stated that I will let her provider know of this and once he sends advise, someone from triage will be in contact with her.

## 2024-02-06 NOTE — Progress Notes (Unsigned)
 Enrolled for [employer] to mail a ZIO XT long term holter monitor to the patients address on file.

## 2024-02-06 NOTE — Telephone Encounter (Signed)
 Left message to call back.   Zio ordered.

## 2024-02-06 NOTE — Telephone Encounter (Signed)
 What is your heart rate? N/A   2) Do you have a log of your heart rate readings (document readings)? no   3) Do you have any other symptoms? Tingling in her arms and hot and sweaty.

## 2024-02-06 NOTE — Telephone Encounter (Signed)
 1. Which medications need to be refilled? (please list name of each medication and dose if known)   spironolactone  (ALDACTONE ) 25 MG tablet ELIQUIS  5 MG TABS tablet    2. Would you like to learn more about the convenience, safety, & potential cost savings by using the [facility]? no     3. Are you open to using the [facility] (Type [facility]. no     4. Which pharmacy/location (including street and city if local pharmacy) is medication to be sent to? [id number] - [place], [place] - [address] AT [address]       5. Do they need a 30 day or 90 day supply? 90 days

## 2024-02-07 DIAGNOSIS — N1831 Chronic kidney disease, stage 3a: Secondary | ICD-10-CM | POA: Diagnosis not present

## 2024-02-07 DIAGNOSIS — E1122 Type 2 diabetes mellitus with diabetic chronic kidney disease: Secondary | ICD-10-CM | POA: Diagnosis not present

## 2024-02-07 MED ORDER — SPIRONOLACTONE 25 MG PO TABS: 25.0000 mg | ORAL_TABLET | Freq: Every day | ORAL | 0 refills | Status: AC

## 2024-02-07 NOTE — Telephone Encounter (Signed)
 Refill sent

## 2024-02-09 NOTE — Telephone Encounter (Signed)
 Spoke with pt, she already has the monitor.

## 2024-02-11 ENCOUNTER — Other Ambulatory Visit (HOSPITAL_BASED_OUTPATIENT_CLINIC_OR_DEPARTMENT_OTHER): Payer: Self-pay | Admitting: Pulmonary Disease

## 2024-02-12 DIAGNOSIS — R002 Palpitations: Secondary | ICD-10-CM | POA: Insufficient documentation

## 2024-02-12 NOTE — Assessment & Plan Note (Addendum)
 Chronic, rate controlled.  Continue diltiazem  and metoprolol  for heart rate control. - Continue Eliquis  for anticoagulation. - Recommend cardiac rehab before pulmonary rehab

## 2024-02-12 NOTE — Assessment & Plan Note (Signed)
 Chronic, uncontrolled.  Chronic diastolic heart failure with impaired relaxation, likely secondary to long-standing hypertension. Recent echocardiogram showed good systolic function but impaired relaxation. Blood pressure management is crucial. - Rechecked blood pressure. - Continue olmesartan  40 mg daily. - Ordered blood work to assess kidney function. - Follow low sodium diet.

## 2024-02-12 NOTE — Assessment & Plan Note (Addendum)
 Atherosclerotic heart disease with previous stent placement. Shortness of breath likely multifactorial, including cardiac, pulmonary, and deconditioning components. Cardiac rehab is recommended to improve conditioning. - Recommended cardiac rehab. - Continue albuterol  as needed for wheezing. - Encouraged physical activity to improve conditioning.

## 2024-02-12 NOTE — Assessment & Plan Note (Signed)
 Chronic, importance of dietary and medication compliance was discussed with the patient.

## 2024-02-12 NOTE — Assessment & Plan Note (Addendum)
 Recent episode of palpitations, possibly related to dehydration or medication changes. Previous low magnesium  levels noted. No recent cardiac testing in two years. - Ordered magnesium  level. - Ensure adequate hydration. - Monitor for recurrence of palpitations.

## 2024-02-12 NOTE — Assessment & Plan Note (Addendum)
 Chronic bilateral foot swelling, possibly exacerbated by Lyrica . Differential includes cardiac and renal causes. - Ordered blood work to assess cardiac and renal function. - Stopped Lyrica . - Advised to stay hydrated, elevate legs, and continue wearing compression hose. - Advised to monitor salt intake.

## 2024-02-12 NOTE — Assessment & Plan Note (Addendum)
 Chronic gout with previous intolerance to certain medications. - Will recheck gout medication regimen.

## 2024-02-14 ENCOUNTER — Other Ambulatory Visit: Payer: Self-pay | Admitting: Internal Medicine

## 2024-02-14 NOTE — Progress Notes (Signed)
 Dawn Wood                                          MRN: [id number]   02/14/2024   The [facility] Quality Team Specialist reviewed this patient medical record for the purposes of chart review for care gap closure. The following were reviewed: chart review for care gap closure-controlling blood pressure.    [facility] Quality Team

## 2024-02-24 ENCOUNTER — Ambulatory Visit: Payer: Self-pay | Admitting: Internal Medicine

## 2024-02-24 ENCOUNTER — Other Ambulatory Visit: Payer: Self-pay

## 2024-02-24 DIAGNOSIS — R002 Palpitations: Secondary | ICD-10-CM

## 2024-02-24 DIAGNOSIS — I4892 Unspecified atrial flutter: Secondary | ICD-10-CM

## 2024-02-24 DIAGNOSIS — I480 Paroxysmal atrial fibrillation: Secondary | ICD-10-CM

## 2024-03-06 NOTE — Telephone Encounter (Signed)
 PAP: Patient assistance application for Novolog  mix 70/30 has been approved by [employer] Companies: [employer] from 03/08/2024 to 03/07/2025. Medication should be delivered to PAP Delivery: Provider's office. For further shipping updates, please contact [employer] at [phone]. Patient ID is: not provided.

## 2024-03-12 ENCOUNTER — Ambulatory Visit (HOSPITAL_BASED_OUTPATIENT_CLINIC_OR_DEPARTMENT_OTHER): Admitting: Pulmonary Disease

## 2024-03-13 ENCOUNTER — Ambulatory Visit: Payer: Self-pay

## 2024-03-13 ENCOUNTER — Ambulatory Visit (HOSPITAL_BASED_OUTPATIENT_CLINIC_OR_DEPARTMENT_OTHER): Admitting: Pulmonary Disease

## 2024-03-13 NOTE — Telephone Encounter (Signed)
" °  FYI Only or Action Required?: Action required by provider: request for appointment.decline in ER  Patient was last seen in primary care on 01/31/2024 by Jarold Medici, MD.  Called Nurse Triage reporting Tinnitus and Shortness of Breath.  Symptoms began several weeks ago.  Interventions attempted: Other: n/a.  Symptoms are: gradually worsening.  Triage Disposition: Go to ED Now (Notify PCP)  Patient/caregiver understands and will follow disposition?: No, wishes to speak with PCP          Copied from CRM [id number]. Topic: Clinical - Red Word Triage >> Mar 13, 2024 10:39 AM Antwanette L wrote: Red Word that prompted transfer to Nurse Triage: Patient is calling to schedule an appointment to have her thyroid  evaluated. She reports swelling, fatigue, and ringing in both ears. Reason for Disposition  Extra heartbeats, irregular heart beating, or heart is beating very fast  (i.e., palpitations)  Answer Assessment - Initial Assessment Questions Patient reports ringing and blowing in ears. Couple of weeks of this. Recent test results done would like  thyroid   checked. Followed by heart doctor has heart monitor. Swelling in neck. Shortness of breath with exertion  is a little worse , and mentions sweating lot. Tired all the time. Wheezing and cough has inhaler . Able to swallow , but sometimes feels like getting stuck in throat, PCP  was to do a swallow test. Legs and feet swollen all time. Still with palpations . Patient does not want to to go to ER wants appointment in office and to have thyroid  checked and also wants swallow test done.  This RN will call [facility]     1. RESPIRATORY STATUS: Describe your breathing? (e.g., wheezing, shortness of breath, unable to speak, severe coughing)      Shortness of breath has this at baseline but this is worse than usual  2. ONSET: When did this breathing problem begin?      Past week fews 3. PATTERN Does the difficult breathing come and go, or  has it been constant since it started?      With exertion constant and worse than usual  4. SEVERITY: How bad is your breathing? (e.g., mild, moderate, severe)      Moderate   5. RECURRENT SYMPTOM: Have you had difficulty breathing before? If Yes, ask: When was the last time? and What happened that time?      Is followed by pulmonary for coronary artery disease  6. CARDIAC HISTORY: Do you have any history of heart disease? 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 LUNG HISTORY: Do you have any history of lung disease?  (e.g., pulmonary embolus, asthma, emphysema)     Per chart  review OSA (obstructive sleep apnea) Asthma, chronic Asthma-COPD overlap syndrome (HCC)     9. OTHER SYMPTOMS: Do you have any other symptoms? (e.g., chest pain, cough, dizziness, fever, runny nose)     Ringing in ears , neck swelling, trouble swallowing, tired all the times, heart palpations   Patient denies chest pain , difficulty breathing at rest, dizziness  Protocols used: Breathing Difficulty-A-AH  "

## 2024-03-13 NOTE — Telephone Encounter (Signed)
 Call to [facility] spoke with Chevon at office and advised patient decline ER and would like appointment with PCP

## 2024-03-15 ENCOUNTER — Encounter: Payer: Self-pay | Admitting: Internal Medicine

## 2024-03-15 ENCOUNTER — Telehealth (HOSPITAL_BASED_OUTPATIENT_CLINIC_OR_DEPARTMENT_OTHER): Payer: Self-pay | Admitting: *Deleted

## 2024-03-15 NOTE — Telephone Encounter (Signed)
 Called patient to schedule her an office visit for a pre-op clearance on 03/27/24 @ 1:55.

## 2024-03-15 NOTE — Telephone Encounter (Signed)
" ° °  Name: Dawn Wood  DOB: [birth year]  MRN: [id number]  Primary Cardiologist: Arun K Thukkani, MD  Chart reviewed as part of pre-operative protocol coverage. Because of [name]'s past medical history and time since last visit, she will require a follow-up in-office visit in order to better assess preoperative cardiovascular risk (pt is due for 6 months follow-up 03/2024).   Pre-op covering staff: - Please schedule appointment and call patient to inform them. If patient already had an upcoming appointment within acceptable timeframe, please add pre-op clearance to the appointment notes so provider is aware. - Please contact requesting surgeon's office via preferred method (i.e, phone, fax) to inform them of need for appointment prior to surgery.  This message will also be routed to pharmacy pool for input on holding Eliquis  as requested below so that this information is available to the clearing provider at time of patient's appointment.   Damien JAYSON Braver, NP  03/15/2024, 11:49 AM   "

## 2024-03-15 NOTE — Telephone Encounter (Signed)
"  ° °  Pre-operative Risk Assessment    Patient Name: Dawn Wood  DOB: 03-23-1945 MRN: [id number]   Date of last office visit: 10/03/23 DR. THUKKANI Date of next office visit: NONE   Request for Surgical Clearance    Procedure:  B/L L5 SNRB (SELECTIVE NERVE ROOT BLOCK)   Date of Surgery:  Clearance TBD                                Surgeon:  DR. LAQUETA Surgeon's Group or Practice Name:  [facility] Phone number:  [phone] EXT [phone] GINGER S. Fax number:  [phone]   Type of Clearance Requested:   - Medical  - Pharmacy:  Hold Apixaban  (Eliquis ) x 3 DAYS PRIOR   Type of Anesthesia:  Not Indicated   Additional requests/questions:    Signed, Davieon Stockham   03/15/2024, 11:26 AM   "

## 2024-03-20 ENCOUNTER — Ambulatory Visit

## 2024-03-20 VITALS — BP 144/82 | HR 68 | Temp 98.1°F | Ht 65.0 in | Wt 231.0 lb

## 2024-03-20 DIAGNOSIS — R609 Edema, unspecified: Secondary | ICD-10-CM

## 2024-03-20 DIAGNOSIS — Z6838 Body mass index (BMI) 38.0-38.9, adult: Secondary | ICD-10-CM

## 2024-03-20 DIAGNOSIS — E010 Iodine-deficiency related diffuse (endemic) goiter: Secondary | ICD-10-CM

## 2024-03-20 DIAGNOSIS — H9313 Tinnitus, bilateral: Secondary | ICD-10-CM

## 2024-03-20 DIAGNOSIS — R1310 Dysphagia, unspecified: Secondary | ICD-10-CM

## 2024-03-20 NOTE — Progress Notes (Signed)
 "  Acute Office Visit  Subjective:     Patient ID: Dawn Wood, female    DOB: 04-08-1945, 79 y.o.   MRN: [id number]  Chief Complaint  Patient presents with   Tinnitus    Patient reports she has ringing in both her ears. She reports the ringing started about 2 weeks ago.    thyroid     Patient reports she is swollen in her neck and she feels something may be wrong with her thyroid . She reports she has been feeling really hot and low energy. She reports she has been peeing a whole lot and sleeping a whole lot.     This is a 79 year old African-American female who is here today with multiple complaints.  She has been having ringing in her ears left greater than right for the past 2-3 weeks that is a loud roaring type noise.  She has DYSPHAGIA with solids and liquids since November, 2025.  She has been having swelling in her feet since she was started on diltiazem .  She has been having some swelling of her neck and some abdominal wall MUSCLE CRAMPS.  She does have a history of CAD and has had 2 MIs.  Her last MI was in 2025.  She does see a cardiologist for CAD and will follow-up with a cardiologist on 03/27/2024.     Review of Systems  HENT:  Positive for tinnitus.        Runny nose  Respiratory:  Positive for cough (some production), shortness of breath (Worse since MI (Sep 2025)) and wheezing.   Cardiovascular:  Positive for palpitations and leg swelling.  Gastrointestinal:  Negative for heartburn.       Dysphagia S/L Nov 2025  Musculoskeletal:        Abdominal wall muscle cramps  Endo/Heme/Allergies:  Negative for environmental allergies.        Objective:    Vitals:   03/20/24 1438  BP: (!) 144/82  Pulse: 68  Temp: 98.1 F (36.7 C)  Height: 5' 5 (1.651 m)  Weight: 231 lb (104.8 kg)  TempSrc: Oral  BMI (Calculated): 38.44    BP Readings from Last 3 Encounters:  03/20/24 (!) 144/82  01/31/24 (!) 142/76  01/12/24 (!) 168/74   Wt Readings from Last 3  Encounters:  03/20/24 231 lb (104.8 kg)  01/31/24 227 lb 12.8 oz (103.3 kg)  01/12/24 226 lb 12.8 oz (102.9 kg)      Physical Exam Vitals and nursing note reviewed.  Constitutional:      Appearance: Normal appearance.     Comments: Elevated systolic BP. She has gained 4 pounds since 01/31/24.  HENT:     Head: Normocephalic and atraumatic.     Right Ear: External ear normal.     Left Ear: External ear normal.     Ears:     Comments: The presence of bilateral wider scar tissue over the tympanic membranes left equals right with inspection Neck:     Thyroid : Thyromegaly present. No thyroid  tenderness.  Cardiovascular:     Rate and Rhythm: Normal rate and regular rhythm.  Pulmonary:     Effort: Pulmonary effort is normal.     Breath sounds: Normal breath sounds. No wheezing.  Abdominal:     General: Bowel sounds are normal.     Palpations: Abdomen is soft.  Musculoskeletal:     Right lower leg: 1+ Edema present.     Left lower leg: Edema present.  Neurological:     Mental  Status: She is alert.     No results found for any visits on 03/20/24.      Assessment & Plan:   Assessment & Plan Tinnitus of both ears      I explained to her that her tinnitus is most likely secondary to past infections that cause scar tissue to develop over the tympanic membranes.    Swelling She was advised on 03/20/2024 that most of her swelling is most likely secondary to the diltiazem .  I advised her to drink no more than 64 ounces a day.  She will follow-up with her cardiologist on 03/27/2024.  She voiced understanding.    Class 2 severe obesity due to excess calories with serious comorbidity and body mass index (BMI) of 38.0 to 38.9 in adult     Dysphagia, unspecified type  Orders:   US  THYROID ; Future  Thyromegaly She was referred for thyroid  ultrasound on 03/20/2024.  She was told if the thyroid  ultrasound is negative that she will be referred to a gastroenterologist for possibly  stretching out her esophagus.  She voiced understanding     Return after thyroid  US  results.  Gaither JONELLE Fischer, DO   "

## 2024-03-20 NOTE — Progress Notes (Signed)
 I,Jameka J Llittleton, CMA,acting as a neurosurgeon for Gaither JONELLE Fischer, DO.,have documented all relevant documentation on the behalf of Gaither JONELLE Fischer, DO,as directed by  Gaither JONELLE Fischer, DO while in the presence of Gaither JONELLE Fischer, DO.  Subjective:  Patient ID: Dawn Wood , female    DOB: 09-Jan-1946 , 79 y.o.   MRN: [id number]  Chief Complaint  Patient presents with   Tinnitus    Patient reports she has ringing in both her ears. She reports the ringing started about 2 weeks ago.    thyroid     Patient reports she is swollen in her neck and she feels something may be wrong with her thyroid . She reports she has been feeling really hot and low energy.     HPI  HPI   Past Medical History:  Diagnosis Date   Asthma    Atrial fibrillation (HCC)    CKD (chronic kidney disease)    Coronary artery disease    Diabetes mellitus without complication (HCC)    GERD (gastroesophageal reflux disease)    Heart attack (HCC) 12/2022   Hypertension    MI (myocardial infarction) (HCC)    Peripheral vascular disease    Sleep apnea      Family History  Problem Relation Age of Onset   Hypertension Mother    Hypertension Father    Throat cancer Father    Breast cancer Sister 64   Aortic stenosis Daughter    Heart disease Daughter        before age 34   Thyroid  disease Daughter    Sleep apnea Neg Hx     Current Medications[1]   Allergies[2]   Review of Systems   Today's Vitals   03/20/24 1438  BP: (!) 144/82  Pulse: 68  Temp: 98.1 F (36.7 C)  TempSrc: Oral  Weight: 231 lb (104.8 kg)  Height: 5' 5 (1.651 m)  PainSc: 0-No pain   Body mass index is 38.44 kg/m.  Wt Readings from Last 3 Encounters:  03/20/24 231 lb (104.8 kg)  01/31/24 227 lb 12.8 oz (103.3 kg)  01/12/24 226 lb 12.8 oz (102.9 kg)    The ASCVD Risk score (Arnett DK, et al., 2019) failed to calculate for the following reasons:   Risk score cannot be calculated because patient has a medical history suggesting  prior/existing ASCVD   * - Cholesterol units were assumed  Objective:  Physical Exam      Assessment And Plan:   Assessment & Plan Tinnitus of both ears  Swelling  Class 2 severe obesity due to excess calories with serious comorbidity and body mass index (BMI) of 38.0 to 38.9 in adult   No orders of the defined types were placed in this encounter.    Return if symptoms worsen or fail to improve.  Patient was given opportunity to ask questions. Patient verbalized understanding of the plan and was able to repeat key elements of the plan. All questions were answered to their satisfaction.    LILLETTE Gaither JONELLE Fischer, DO, have reviewed all documentation for this visit. The documentation on 03/20/2024 for the exam, diagnosis, procedures, and orders are all accurate and complete.   IF YOU HAVE BEEN REFERRED TO A SPECIALIST, IT MAY TAKE 1-2 WEEKS TO SCHEDULE/PROCESS THE REFERRAL. IF YOU HAVE NOT HEARD FROM US /SPECIALIST IN TWO WEEKS, PLEASE GIVE US  A CALL AT [phone] X [phone].     [1]  Current Outpatient Medications:    Accu-Chek Softclix Lancets lancets, USE  TO CHECK BLOOD SUGARS ONCE DAILY., Disp: 100 each, Rfl: 12   albuterol  (VENTOLIN  HFA) 108 (90 Base) MCG/ACT inhaler, INHALE 2 PUFFS INTO THE LUNGS EVERY 6 HOURS AS NEEDED FOR WHEEZING OR SHORTNESS OF BREATH, Disp: 6.7 g, Rfl: 2   BD PEN NEEDLE NANO 2ND GEN 32G X 4 MM MISC, USE AS DIRECTED TWICE DAILY, Disp: 100 each, Rfl: 2   Blood Glucose Monitoring Suppl (ACCU-CHEK GUIDE) w/Device KIT, USE TO CHECK BLOOD SUGARS ONCE DAILY., Disp: 1 kit, Rfl: 0   Cholecalciferol (VITAMIN D3) 2000 units TABS, Take 1 tablet by mouth daily., Disp: , Rfl:    colchicine  0.6 MG tablet, TAKE 1 TABLET BY MOUTH ONCE DAILY AS NEEDED FOR GOUT, Disp: 30 tablet, Rfl: 1   Continuous Glucose Sensor (DEXCOM G7 SENSOR) MISC, Use daily to check blood sugars. Change every 10 days., Disp: 4 each, Rfl: 2   diltiazem  (CARDIZEM  CD) 180 MG 24 hr capsule, Take 1 capsule (180 mg  total) by mouth daily., Disp: 90 capsule, Rfl: 1   dorzolamide -timolol  (COSOPT ) 2-0.5 % ophthalmic solution, Place 1 drop into the right eye 2 (two) times daily., Disp: , Rfl:    ELIQUIS  5 MG TABS tablet, TAKE ONE TABLET BY MOUTH TWICE DAILY, Disp: 180 tablet, Rfl: 1   Evolocumab  (REPATHA  SURECLICK) 140 MG/ML SOAJ, Inject 140 mg into the skin every 14 (fourteen) days., Disp: 6 mL, Rfl: 3   fluticasone  (FLONASE ) 50 MCG/ACT nasal spray, Place 2 sprays into both nostrils daily., Disp: 16 g, Rfl: 2   fluticasone -salmeterol (WIXELA INHUB) 100-50 MCG/ACT AEPB, Inhale 1 puff into the lungs 2 (two) times daily., Disp: 60 each, Rfl: 5   furosemide  (LASIX ) 20 MG tablet, TAKE 1 TABLET(20 MG) BY MOUTH DAILY, Disp: 30 tablet, Rfl: 10   glucose blood (ACCU-CHEK GUIDE TEST) test strip, USE ONCE DAILT TO CHECK BLOOD SUGAR, Disp: 100 strip, Rfl: 2   glucose blood (ACCU-CHEK GUIDE TEST) test strip, Use as instructed, Disp: 100 each, Rfl: 12   glucose blood (ACCU-CHEK GUIDE) test strip, USE TO CHECK BLOOD SUGARS ONCE DAILY., Disp: 100 each, Rfl: 12   insulin  aspart protamine - aspart (NOVOLOG  MIX 70/30 FLEXPEN) (70-30) 100 UNIT/ML FlexPen, INJECT 40 UNITS SUBCUTANEOUS EVERY MORNING AND 50 UNITS SUBCUTANEOUS AT DINNER, Disp: 30 mL, Rfl: 2   isosorbide  mononitrate (IMDUR ) 60 MG 24 hr tablet, Take 1 tablet (60 mg total) by mouth daily., Disp: 90 tablet, Rfl: 2   Latanoprostene Bunod  (VYZULTA ) 0.024 % SOLN, Place 1 drop into the right eye at bedtime., Disp: , Rfl:    methocarbamol  (ROBAXIN ) 500 MG tablet, Take 1 tablet (500 mg total) by mouth every 8 (eight) hours as needed for muscle spasms., Disp: 30 tablet, Rfl: 0   metoprolol  succinate (TOPROL -XL) 100 MG 24 hr tablet, Take 1.5 tablets (150 mg total) by mouth daily. Take with or immediately following a meal., Disp: 135 tablet, Rfl: 2   nitroGLYCERIN  (NITROSTAT ) 0.4 MG SL tablet, Place 1 tablet (0.4 mg total) under the tongue every 5 (five) minutes as needed for chest  pain., Disp: 25 tablet, Rfl: 0   olmesartan  (BENICAR ) 40 MG tablet, TAKE 1 TABLET(40 MG) BY MOUTH DAILY, Disp: 90 tablet, Rfl: 2   pantoprazole  (PROTONIX ) 40 MG tablet, TAKE 1 TABLET(40 MG) BY MOUTH DAILY, Disp: 90 tablet, Rfl: 1   polyethylene glycol (MIRALAX ) 17 g packet, Take 17 g by mouth daily., Disp: 30 each, Rfl: 0   potassium chloride  (KLOR-CON ) 10 MEQ tablet, Take 1 tablet (10  mEq total) by mouth at bedtime., Disp: , Rfl:    Semaglutide ,0.25 or 0.5MG /DOS, (OZEMPIC , 0.25 OR 0.5 MG/DOSE,) 2 MG/1.5ML SOPN, INJECT 0.5MG  INTO SKIN ONCE A WEEK, Disp: 4.5 mL, Rfl: 3   spironolactone  (ALDACTONE ) 25 MG tablet, Take 1 tablet (25 mg total) by mouth daily., Disp: 90 tablet, Rfl: 0 [2]  Allergies Allergen Reactions   Dust Mite Extract Other (See Comments)    REACTION: SNEEZING   Other Other (See Comments)    ALLERGEN: DEODORIZERS SPRAY AND PERFUMES REACTION: SNEEZING   Statins Other (See Comments)    Muscle aches with rosuvastatin  5 mg daily and 3x/weekly, atorvastatin  20 mg daily and 3x/week, simvastatin 40 mg daily    Allopurinol      Diarrhea and the pt said it made her sugars low   Pollen Extract Other (See Comments)    Reaction unknown

## 2024-03-20 NOTE — Assessment & Plan Note (Addendum)
 SABRA

## 2024-03-26 ENCOUNTER — Other Ambulatory Visit: Payer: Self-pay | Admitting: Physician Assistant

## 2024-03-26 ENCOUNTER — Other Ambulatory Visit: Payer: Self-pay | Admitting: Internal Medicine

## 2024-03-26 NOTE — Telephone Encounter (Signed)
 Patient with diagnosis of afib on Eliquis  for anticoagulation.    Procedure: B/L L5 SNRB (SELECTIVE NERVE ROOT BLOCK)  Date of procedure: TBD   CHA2DS2-VASc Score = 6   This indicates a 9.7% annual risk of stroke. The patient's score is based upon: CHF History: 0 HTN History: 1 Diabetes History: 1 Stroke History: 0 Vascular Disease History: 1 Age Score: 2 Gender Score: 1      CrCl 46 ml/min Platelet count 252  Patient has not had an Afib/aflutter ablation in the last 3 months, DCCV within the last 4 weeks or a watchman implanted in the last 45 days   Per office protocol, patient can hold Eliquis  for 3 days prior to procedure.    **This guidance is not considered finalized until pre-operative APP has relayed final recommendations.**

## 2024-03-26 NOTE — Progress Notes (Unsigned)
 "  Office Visit    Patient Name: Dawn Wood Date of Encounter: 03/27/2024  PCP:  Jarold Medici, MD   [facility]  Cardiologist:  Lurena MARLA Red, MD  Advanced Practice Provider:  No care team member to display Electrophysiologist:  None   HPI    Dawn Wood is a 79 y.o. female with a past medical history of CAD prior LAD DES stent (2015), hypertension, DM 2, PAD, osteoarthritis, gouty arthritis, and hyperlipidemia presents today for follow-up appointment.  She was last seen 01/14/2022 and was having symptoms of irregular feelings in her chest related to atrial fibrillation based on a recent monitor.  No angina or sublingual nitro use.  I saw her 03/2022, she states that she had recent eye surgery and she is coming off of some steroids.  She does have some lower extremity edema.  She had some chest soreness for the last 3 to 4 days which can be reproduced by pushing on her chest.  She has not done any exercise that would lead to chest wall soreness.  She does state that when she leans back it significantly worse.  She did have a stent placed back in 2015 and that was her last echocardiogram.  This does not feel like the pain that she was having when she had her stent.  Nothing makes the pain subside.  She states its about a 7 out of 10.  We gave her a sublingual nitro here in the clinic.  She states that she had taken 1 at home in the past and had not really helped.  She had been on colchicine  before for gout.  I discussed her case with Dr. Alveta who also spoke with the patient today.  EKG unremarkable.  I saw her 01/2023, she presents with a history of heart attack, hypertension, gout, and sleep apnea, with multiple complaints. The chief complaint is swelling in the legs and hands, which has been present for two weeks. The patient also reports shortness of breath, which has worsened since starting additional blood thinning medication. The shortness  of breath is severe enough to prevent the patient from putting on socks without becoming winded and causes the patient to wake up in the middle of the night to get fresh air. The patient also reports chest pain on two occasions since her heart attack, which was relieved with one nitroglycerin  each time. The patient has difficulty sleeping, often waking up suddenly and short of breath. The patient also reports pain and swelling in the right arm, where a previous IV was placed. The patient has a history of gout and was taking colchicine , but stopped due to concerns about kidney damage. The patient has restarted Lasix , which was previously stopped due to a potential gout flare-up.  Her BP was elevated during our encounter today and was 180/92 then on repeat 182/88. She took her medications right before her appointment. She does not want to be on any more medications but she is maxed out on her Omesartan and her Imdur  was just increased.   No PND. Reports no palpitations.   Discussed the use of AI scribe software for clinical note transcription with the patient, who gave verbal consent to proceed.  Last cardiac catheterization 11/26/2022 showing two-vessel CAD with culprit proximal LAD at prior stent margin.  CTO of the mid RCA.  Successful PCI of the proximal LAD with DES x 1.  Attempted PCI of the mid RCA but unable to cross  the wire and POBA was performed with only minimal improvement in flow.  Unable to deliver stent.  Plan was to continue DAPT with ASA and Plavix  (see aspirin  for a month Plavix  for 12 months).  Resume Eliquis  and treat RCA medically.  If she has refractory angina consider dedicated CTO procedure from femoral access.   She was seen in the office by Ronal Ross, MD in March 2025.  She is not experiencing any chest pain at that time.  She did endorse some increased shortness of breath activity and she was volume up on exam.  Blood pressure was elevated.  She was cleared to participate in  cardiac rehab at that time.  Today, she presents for cardiac clearance.  She has worsening shortness of breath and marked fatigue with minimal exertion, such as walking up three steps, and attributes this to asthma. She has lower extremity swelling while on diltiazem  180 mg, Lasix  20 mg daily, and spironolactone , with minimal response to higher Lasix  doses and recent weight gain from 211 to 230 pounds over the last few months.   She has kidney disease and limits fluid intake due to concern for dehydration and cramps. She has sleep apnea but is unable to tolerate CPAP and sleeps poorly. 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 She has diabetes and previously stopped Jardiance  because of a yeast infection.  Reports no chest pain, pressure, or tightness. No edema, orthopnea, PND. Reports no palpitations.   Discussed the use of AI scribe software for clinical note transcription with the patient, who gave verbal consent to proceed.  Past Medical History    Past Medical History:  Diagnosis Date   Asthma    Atrial fibrillation (HCC)    CKD (chronic kidney disease)    Coronary artery disease    Diabetes mellitus without complication (HCC)    GERD (gastroesophageal reflux disease)    Heart attack (HCC) 12/2022   Hypertension    MI (myocardial infarction) [facility])    Peripheral vascular disease    Sleep apnea    Past Surgical History:  Procedure Laterality Date   ABDOMINAL HYSTERECTOMY  1985   APPENDECTOMY     CATARACT EXTRACTION Left 02/20/2018   Dr. Cyrus   CATARACT EXTRACTION Bilateral 2024   cataract surgery Right 01/25/2022   Second at [facility] 11/29   CORONARY STENT INTERVENTION N/A 11/30/2022   Procedure: CORONARY STENT INTERVENTION;  Surgeon: Jordan, Peter M, MD;  Location: [facility] INVASIVE CV LAB;  Service: Cardiovascular;  Laterality: N/A;  LAD, RCA   CORONARY STENT PLACEMENT  2015   CORONARY  ULTRASOUND/IVUS N/A 11/30/2022   Procedure: Coronary Ultrasound/IVUS;  Surgeon: Jordan, Peter M, MD;  Location: [facility] INVASIVE CV LAB;  Service: Cardiovascular;  Laterality: N/A;  LAD, RCA   ectopic pregnancy--unilateral salpingectomy  1980   KNEE ARTHROSCOPY Left 03/2016   LEFT HEART CATH AND CORONARY ANGIOGRAPHY N/A 11/30/2022   Procedure: LEFT HEART CATH AND CORONARY ANGIOGRAPHY;  Surgeon: Jordan, Peter M, MD;  Location[facility] INVASIVE CV LAB;  Service: Cardiovascular;  Laterality: N/A;   unilateral oophorectomy      Allergies  Allergies  Allergen Reactions   Dust Mite Extract Other (See Comments)    REACTION: SNEEZING   Other Other (See Comments)    ALLERGEN: DEODORIZERS SPRAY AND PERFUMES REACTION: SNEEZING   Statins Other (See Comments)    Muscle aches with rosuvastatin  5 mg daily and 3x/weekly, atorvastatin  20  mg daily and 3x/week, simvastatin 40 mg daily    Allopurinol      Diarrhea and the pt said it made her sugars low   Pollen Extract Other (See Comments)    Reaction unknown     EKGs/Labs/Other Studies Reviewed:   The following studies were reviewed today:  Cath: 12/01/2022   Fluoro time: 35.1 (min) DAP: 14315 (mGycm2) Cumulative Air Kerma: 2496 (mGy)       Ost LAD to Prox LAD lesion is 90% stenosed.   1st Mrg lesion is 60% stenosed.   Dist Cx lesion is 70% stenosed.   Prox RCA to Mid RCA lesion is 99% stenosed.   Previously placed Prox LAD stent of unknown type is  widely patent.   A drug-eluting stent was successfully placed using a SYNERGY XD 3.50X12.   Balloon angioplasty was performed using a BALLN EMERGE MR 2.5X12.   Post intervention, there is a 0% residual stenosis.   Post intervention, there is a 0% residual stenosis.   Post intervention, there is a 99% residual stenosis.   LV end diastolic pressure is mildly elevated.   Recommend to resume Apixaban , at currently prescribed dose and frequency on 12/01/2022.   Recommend concurrent antiplatelet therapy of  Aspirin  81 mg for 1 month and Clopidogrel  75mg  daily for 12 months .   2 vessel obstructive CAD. Culprit lesion is proximal LAD at prior stent margin. CTO of the mid RCA Mildly elevated LVEDP Successful PCI of the proximal LAD with DES x 1 Attempted PCI of the mid RCA. Able to cross with wire and POBA was performed with only minimal improvement in flow. Unable to deliver a stent   Plan: DAPT with ASA for one month, Plavix  for 12 months. May resume Eliquis  tomorrow if no bleeding. Would treat RCA disease medically. If she has refractory angina could consider dedicated CTO procedure from femoral access.    Diagnostic Dominance: Right  Intervention      Lexiscan  Myoview  06/05/2021    The study is normal. The study is low risk.   No ST deviation was noted.   Left ventricular function is normal. Nuclear stress EF: 69 %. The left ventricular ejection fraction is hyperdynamic (>65%). End diastolic cavity size is normal.   Prior study available for comparison from 10/22/1998.      Cardiac monitor 11/10/21   Basic rhythm is NSR with average HR 77 bpm   104 SVT runs with longest 1.5 minutes   Atrial fibrillation burden < 1% with longest lasting 13.5 minutes at average HR 148 bpm   One symptomatic episode correlated with AF     Patch Wear Time:  14 days and 0 hours (2023-09-29T10:13:25-0400 to 2023-10-13T10:13:29-0400)   Patient had a min HR of 49 bpm, max HR of 197 bpm, and avg HR of 77 bpm. Predominant underlying rhythm was Sinus Rhythm. 104 Supraventricular Tachycardia runs occurred, the run with the fastest interval lasting 11.7 secs with a max rate of 197 bpm, the  longest lasting 1 min 22 secs with an avg rate of 145 bpm. Atrial Fibrillation/Flutter occurred (<1% burden), ranging from 86-186 bpm (avg of 138 bpm), the longest lasting 13 mins 31 secs with an avg rate of 148 bpm. Atrial Fibrillation/Flutter was  detected within +/- 45 seconds of symptomatic patient event(s). Isolated SVEs  were rare (<1.0%), SVE Couplets were rare (<1.0%), and SVE Triplets were rare (<1.0%). Isolated VEs were rare (<1.0%, 2298), VE Couplets were rare (<1.0%, 42), and VE Triplets  were rare (<  1.0%, 2). Ventricular Bigeminy and Trigeminy were present.     EKG:  EKG is  ordered today.  The ekg ordered today demonstrates sinus bradycardia and LVH  Recent Labs: 06/16/2023: Hemoglobin 14.7; Platelets 252; TSH 2.835 12/22/2023: ALT 14 01/31/2024: BNP 305.3; BUN 17; Creatinine, Ser 1.19; Magnesium  1.8; Potassium 4.0; Sodium 142  Recent Lipid Panel    Component Value Date/Time   CHOL 132 10/03/2023 1604   CHOL 167 05/28/2013 0413   TRIG 199 (H) 10/03/2023 1604   TRIG 180 05/28/2013 0413   HDL 44 10/03/2023 1604   HDL 37 (L) 05/28/2013 0413   CHOLHDL 3.0 10/03/2023 1604   CHOLHDL 4.7 12/01/2022 0659   VLDL 25 12/01/2022 0659   VLDL 36 05/28/2013 0413   LDLCALC 56 10/03/2023 1604   LDLCALC 94 05/28/2013 0413   LDLDIRECT 83 06/11/2019 1425    Home Medications   Current Meds  Medication Sig   Accu-Chek Softclix Lancets lancets USE TO CHECK BLOOD SUGARS ONCE DAILY.   albuterol  (VENTOLIN  HFA) 108 (90 Base) MCG/ACT inhaler INHALE 2 PUFFS INTO THE LUNGS EVERY 6 HOURS AS NEEDED FOR WHEEZING OR SHORTNESS OF BREATH   BD PEN NEEDLE NANO 2ND GEN 32G X 4 MM MISC USE AS DIRECTED TWICE DAILY   Blood Glucose Monitoring Suppl (ACCU-CHEK GUIDE) w/Device KIT USE TO CHECK BLOOD SUGARS ONCE DAILY.   Cholecalciferol (VITAMIN D3) 2000 units TABS Take 1 tablet by mouth daily.   colchicine  0.6 MG tablet TAKE 1 TABLET BY MOUTH ONCE DAILY AS NEEDED FOR GOUT   Continuous Glucose Sensor (DEXCOM G7 SENSOR) MISC Use daily to check blood sugars. Change every 10 days.   diltiazem  (CARDIZEM  CD) 180 MG 24 hr capsule Take 1 capsule (180 mg total) by mouth daily.   dorzolamide -timolol  (COSOPT ) 2-0.5 % ophthalmic solution Place 1 drop into the right eye 2 (two) times daily.   ELIQUIS  5 MG TABS tablet TAKE ONE TABLET BY MOUTH  TWICE DAILY   Evolocumab  (REPATHA  SURECLICK) 140 MG/ML SOAJ Inject 140 mg into the skin every 14 (fourteen) days.   fluticasone  (FLONASE ) 50 MCG/ACT nasal spray Place 2 sprays into both nostrils daily.   fluticasone -salmeterol (WIXELA INHUB) 100-50 MCG/ACT AEPB Inhale 1 puff into the lungs 2 (two) times daily.   glucose blood (ACCU-CHEK GUIDE TEST) test strip USE ONCE DAILT TO CHECK BLOOD SUGAR   glucose blood (ACCU-CHEK GUIDE TEST) test strip Use as instructed   glucose blood (ACCU-CHEK GUIDE) test strip USE TO CHECK BLOOD SUGARS ONCE DAILY.   insulin  aspart protamine - aspart (NOVOLOG  MIX 70/30 FLEXPEN) (70-30) 100 UNIT/ML FlexPen INJECT 40 UNITS SUBCUTANEOUS EVERY MORNING AND 50 UNITS SUBCUTANEOUS AT DINNER   isosorbide  mononitrate (IMDUR ) 60 MG 24 hr tablet Take 1 tablet (60 mg total) by mouth daily.   Latanoprostene Bunod  (VYZULTA ) 0.024 % SOLN Place 1 drop into the right eye at bedtime.   methocarbamol  (ROBAXIN ) 500 MG tablet Take 1 tablet (500 mg total) by mouth every 8 (eight) hours as needed for muscle spasms.   metoprolol  succinate (TOPROL -XL) 100 MG 24 hr tablet Take 1.5 tablets (150 mg total) by mouth daily. Take with or immediately following a meal.   nitroGLYCERIN  (NITROSTAT ) 0.4 MG SL tablet Place 1 tablet (0.4 mg total) under the tongue every 5 (five) minutes as needed for chest pain.   olmesartan  (BENICAR ) 40 MG tablet TAKE 1 TABLET(40 MG) BY MOUTH DAILY   pantoprazole  (PROTONIX ) 40 MG tablet TAKE 1 TABLET(40 MG) BY MOUTH DAILY   polyethylene  glycol (MIRALAX ) 17 g packet Take 17 g by mouth daily.   Semaglutide ,0.25 or 0.5MG /DOS, (OZEMPIC , 0.25 OR 0.5 MG/DOSE,) 2 MG/1.5ML SOPN INJECT 0.5MG  INTO SKIN ONCE A WEEK   spironolactone  (ALDACTONE ) 25 MG tablet Take 1 tablet (25 mg total) by mouth daily.   [DISCONTINUED] furosemide  (LASIX ) 20 MG tablet TAKE 1 TABLET(20 MG) BY MOUTH DAILY   [DISCONTINUED] potassium chloride  (KLOR-CON ) 10 MEQ tablet Take 1 tablet (10 mEq total) by mouth at  bedtime.     Review of Systems      All other systems reviewed and are otherwise negative except as noted above.  Physical Exam    VS:  BP (!) 144/68   Pulse (!) 55   Ht 5' 5 (1.651 m)   Wt 229 lb (103.9 kg)   SpO2 96%   BMI 38.11 kg/m  , BMI Body mass index is 38.11 kg/m.  Wt Readings from Last 3 Encounters:  03/27/24 229 lb (103.9 kg)  03/20/24 231 lb (104.8 kg)  01/31/24 227 lb 12.8 oz (103.3 kg)     GEN: Well nourished, well developed, in no acute distress. HEENT: normal. Neck: Supple, no JVD, carotid bruits, or masses. Cardiac: RRR, no murmurs, rubs, or gallops. No clubbing, cyanosis, edema.  Radials/PT 2+ and equal bilaterally.  Respiratory:  Respirations regular and unlabored, clear to auscultation bilaterally. GI: Soft, nontender, nondistended. MS: No deformity or atrophy. Skin: Warm and dry, no rash. Neuro:  Strength and sensation are intact. Psych: Normal affect.  Assessment & Plan    Preop Clearance   Ms. Monterrosa's perioperative risk of a major cardiac event is 11% according to the Revised Cardiac Risk Index (RCRI).  Therefore, she is at high risk for perioperative complications.   Her functional capacity is fair at 4.06 METs according to the Duke Activity Status Index (DASI). Recommendations: The patient requires an echocardiogram before a disposition can be made regarding surgical risk.                Antiplatelet and/or Anticoagulation Recommendations:  Eliquis  (Apixaban ) can be held for 3 days prior to surgery.  Please resume post op when felt to be safe.     Chronic diastolic heart failure Worsening dyspnea and peripheral edema with possible cardiac and pulmonary etiology. Recent weight gain and fluid retention noted. Potential dehydration due to fluid restriction. Differential includes thyroid  dysfunction and sleep apnea. - Ordered echocardiogram to assess heart function and valves. - Increased Lasix  to 40 mg daily for three days, monitor weight. -  Refilled Lasix  and potassium prescriptions. - Encouraged fluid intake of at least 50 fluid ounces daily. - Advised follow-up with sleep medicine specialist for sleep apnea management.  Medication management Current medications include Lasix  20 mg daily and potassium supplementation. Issues with medication availability. Discussed potential alternative diuretics due to inadequate response to Lasix . - Refilled Lasix  and potassium prescriptions. - Will consider alternative diuretics if Lasix  is ineffective.  Secondary hypercoagulable state Plan to hold Eliquis  three days prior to back injections and restart the following day as per procedure guidelines. - Hold Eliquis  three days prior to back injections. - Restart Eliquis  the day after injections.    Disposition: Follow up 3-4 months with Arun K Thukkani, MD or APP.  Signed, Orren LOISE Fabry, PA-C 03/27/2024, 2:56 PM [facility]  "

## 2024-03-27 ENCOUNTER — Other Ambulatory Visit (HOSPITAL_COMMUNITY): Payer: Self-pay

## 2024-03-27 ENCOUNTER — Ambulatory Visit: Attending: Physician Assistant | Admitting: Physician Assistant

## 2024-03-27 ENCOUNTER — Encounter: Payer: Self-pay | Admitting: Physician Assistant

## 2024-03-27 VITALS — BP 144/68 | HR 55 | Ht 65.0 in | Wt 229.0 lb

## 2024-03-27 DIAGNOSIS — I739 Peripheral vascular disease, unspecified: Secondary | ICD-10-CM | POA: Diagnosis not present

## 2024-03-27 DIAGNOSIS — I4892 Unspecified atrial flutter: Secondary | ICD-10-CM | POA: Diagnosis not present

## 2024-03-27 DIAGNOSIS — D6869 Other thrombophilia: Secondary | ICD-10-CM

## 2024-03-27 DIAGNOSIS — R002 Palpitations: Secondary | ICD-10-CM | POA: Diagnosis not present

## 2024-03-27 DIAGNOSIS — I10 Essential (primary) hypertension: Secondary | ICD-10-CM | POA: Diagnosis not present

## 2024-03-27 DIAGNOSIS — Z79899 Other long term (current) drug therapy: Secondary | ICD-10-CM

## 2024-03-27 DIAGNOSIS — R0609 Other forms of dyspnea: Secondary | ICD-10-CM | POA: Diagnosis not present

## 2024-03-27 DIAGNOSIS — I5032 Chronic diastolic (congestive) heart failure: Secondary | ICD-10-CM | POA: Diagnosis not present

## 2024-03-27 DIAGNOSIS — I480 Paroxysmal atrial fibrillation: Secondary | ICD-10-CM | POA: Diagnosis not present

## 2024-03-27 MED ORDER — FUROSEMIDE 20 MG PO TABS
20.0000 mg | ORAL_TABLET | Freq: Every day | ORAL | 10 refills | Status: AC
  Filled 2024-03-27: qty 30, 15d supply, fill #0

## 2024-03-27 MED ORDER — POTASSIUM CHLORIDE ER 10 MEQ PO TBCR
10.0000 meq | EXTENDED_RELEASE_TABLET | Freq: Every day | ORAL | 1 refills | Status: AC
  Filled 2024-03-27: qty 90, 90d supply, fill #0

## 2024-03-27 NOTE — Patient Instructions (Signed)
 Medication Instructions:  FOR THE NEXT THREE DAYS TAKE AN EXTRA LASIX  DAILY; THEN GO BACK TO ONE TABLET DAILY AND AN ADDITIONAL TABLET AS NEEDED FOR SWELLING  THE SAME INSTRUCTIONS AP[PLY FOR YOUR POTASSIUM ; INCREASE WHENEVER YOU INCREASE LASIX  *If you need a refill on your cardiac medications before your next appointment, please call your pharmacy*  Lab Work: NONE ORDERED If you have labs (blood work) drawn today and your tests are completely normal, you will receive your results only by: MyChart Message (if you have MyChart) OR A paper copy in the mail If you have any lab test that is abnormal or we need to change your treatment, we will call you to review the results.  Testing/Procedures: Your physician has requested that you have an echocardiogram. Echocardiography is a painless test that uses sound waves to create images of your heart. It provides your doctor with information about the size and shape of your heart and how well your hearts chambers and valves are working. This procedure takes approximately one hour. There are no restrictions for this procedure. Please do NOT wear cologne, perfume, aftershave, or lotions (deodorant is allowed). Please arrive 15 minutes prior to your appointment time.  Please note: We ask at that you not bring children with you during ultrasound (echo/ vascular) testing. Due to room size and safety concerns, children are not allowed in the ultrasound rooms during exams. Our front office staff cannot provide observation of children in our lobby area while testing is being conducted. An adult accompanying a patient to their appointment will only be allowed in the ultrasound room at the discretion of the ultrasound technician under special circumstances. We apologize for any inconvenience. ECHO TESTING IS PERFORMED ON THE SECOND FLOOR IN THE SAME BUILDING AS YOUR HEART PROVIDER  Follow-Up: At [facility], you and your health needs are our priority.   As part of our continuing mission to provide you with exceptional heart care, our providers are all part of one team.  This team includes your primary Cardiologist (physician) and Advanced Practice Providers or APPs (Physician Assistants and Nurse Practitioners) who all work together to provide you with the care you need, when you need it.  Your next appointment:   3-4 month(s)  Provider:   Arun K Thukkani, MD or Orren Fabry, PA   We recommend signing up for the patient portal called MyChart.  Sign up information is provided on this After Visit Summary.  MyChart is used to connect with patients for Virtual Visits (Telemedicine).  Patients are able to view lab/test results, encounter notes, upcoming appointments, etc.  Non-urgent messages can be sent to your provider as well.   To learn more about what you can do with MyChart, go to [email or web address].   Other Instructions

## 2024-03-28 ENCOUNTER — Ambulatory Visit (HOSPITAL_COMMUNITY)
Admission: RE | Admit: 2024-03-28 | Discharge: 2024-03-28 | Disposition: A | Discharge status: Home / Self Care | Source: Ambulatory Visit

## 2024-03-28 DIAGNOSIS — R1310 Dysphagia, unspecified: Secondary | ICD-10-CM | POA: Insufficient documentation

## 2024-03-28 DIAGNOSIS — E010 Iodine-deficiency related diffuse (endemic) goiter: Secondary | ICD-10-CM | POA: Diagnosis present

## 2024-03-29 ENCOUNTER — Other Ambulatory Visit (HOSPITAL_BASED_OUTPATIENT_CLINIC_OR_DEPARTMENT_OTHER): Payer: Self-pay

## 2024-03-29 ENCOUNTER — Ambulatory Visit (HOSPITAL_BASED_OUTPATIENT_CLINIC_OR_DEPARTMENT_OTHER): Admitting: Pulmonary Disease

## 2024-03-29 ENCOUNTER — Encounter (HOSPITAL_BASED_OUTPATIENT_CLINIC_OR_DEPARTMENT_OTHER): Payer: Self-pay | Admitting: Pulmonary Disease

## 2024-03-29 VITALS — BP 138/71 | HR 56 | Ht 65.0 in | Wt 228.1 lb

## 2024-03-29 DIAGNOSIS — Z87891 Personal history of nicotine dependence: Secondary | ICD-10-CM

## 2024-03-29 DIAGNOSIS — J4530 Mild persistent asthma, uncomplicated: Secondary | ICD-10-CM | POA: Diagnosis not present

## 2024-03-29 DIAGNOSIS — R918 Other nonspecific abnormal finding of lung field: Secondary | ICD-10-CM | POA: Diagnosis not present

## 2024-03-29 MED ORDER — FLUTICASONE-SALMETEROL 230-21 MCG/ACT IN AERO: 2.0000 | INHALATION_SPRAY | Freq: Two times a day (BID) | RESPIRATORY_TRACT | 12 refills | Status: DC

## 2024-03-29 NOTE — Assessment & Plan Note (Signed)
--  Reviewed CT Chest 11/22/23. Subcentimeter nodules stable <57mm. --No further imaging indicated

## 2024-03-29 NOTE — Progress Notes (Signed)
 "   Subjective:   PATIENT ID: Dawn Wood GENDER: female DOB: 02-04-1946, MRN: [id number]  Chief Complaint  Patient presents with   Asthma    Reason for Visit: Follow-up shortness of breath  [name] is a 79 year old female former smoker with CAD, MI in 2015 and 2024, HTN, HLD, PVD, atrial fibrillation, OSA,, DM2 who presents for follow-up  Initial consult She has had a year of shortness of breath with exertion and noticeable worsening after her heart attack in September three months ago where she had stent placed in pLAD. Associated with coughing and wheezing. Not necessarily with activity. Denies preceding illness. Denies childhood asthma and mother with asthma. Since her retirement in 08/2021 she is less active. Has shortness of breath with moving trash can. She has never been on maintenance or rescue inhalers.  05/25/23 Since our last visit she has been using the rescue inhaler with a little improvement. She continues to have shortness of breath daily and wheezing with activity. Feels limited in what she can do due to her symptoms.   01/12/24 Since our last visit she had been prescribed Symbicort  but stopped due to ineffectiveness and cost. On August visit with NP she was advised to restart symbicort  for persistent symptoms of productive cough and shortness of breath. Wheezing in the mornings. She reports that it continues to be ineffective. Uses albuterol  twice a day but unclear if effective either. She is currently on prednisone  for chronic back pain. Activity is limited for both back and lung reasons.  03/29/24 Since our last visit we had changed from Symbicort  to [facility]. She is using albuterol  three times a day for wheezing and shortness of breath. Also having productive cough. She did not tolerate Wixela.  Asthma Control Test ACT Total Score  03/29/2024  3:32 PM 10   Social History: Former smoker. Quit 8 years. 1/2 ppd x 45 years Previously worked intermittently  in the [employer] with exposure to chemicals  Past Medical History:  Diagnosis Date   Asthma    Atrial fibrillation (HCC)    CKD (chronic kidney disease)    Coronary artery disease    Diabetes mellitus without complication (HCC)    GERD (gastroesophageal reflux disease)    Heart attack (HCC) 12/2022   Hypertension    MI (myocardial infarction) (HCC)    Peripheral vascular disease    Sleep apnea      Family History  Problem Relation Age of Onset   Hypertension Mother    Hypertension Father    Throat cancer Father    Breast cancer Sister 65   Aortic stenosis Daughter    Heart disease Daughter        before age 61   Thyroid  disease Daughter    Sleep apnea Neg Hx      Social History   Occupational History   Not on file  Tobacco Use   Smoking status: Former    Current packs/day: 0.00    Average packs/day: 0.3 packs/day for 45.0 years (11.3 ttl pk-yrs)    Types: Cigarettes    Start date: 01/11/1968    Quit date: 01/10/2013    Years since quitting: 11.2    Passive exposure: Never   Smokeless tobacco: Never   Tobacco comments:    Former smoker 06/24/23  Vaping Use   Vaping status: Never Used  Substance and Sexual Activity   Alcohol use: Not Currently    Alcohol/week: 0.0 standard drinks of alcohol  Drug use: No   Sexual activity: Not Currently    Allergies  Allergen Reactions   Dust Mite Extract Other (See Comments)    REACTION: SNEEZING   Other Other (See Comments)    ALLERGEN: DEODORIZERS SPRAY AND PERFUMES REACTION: SNEEZING   Statins Other (See Comments)    Muscle aches with rosuvastatin  5 mg daily and 3x/weekly, atorvastatin  20 mg daily and 3x/week, simvastatin 40 mg daily    Allopurinol      Diarrhea and the pt said it made her sugars low   Pollen Extract Other (See Comments)    Reaction unknown     Outpatient Medications Prior to Visit  Medication Sig Dispense Refill   Accu-Chek Softclix Lancets lancets USE TO CHECK  BLOOD SUGARS ONCE DAILY. 100 each 12   albuterol  (VENTOLIN  HFA) 108 (90 Base) MCG/ACT inhaler INHALE 2 PUFFS INTO THE LUNGS EVERY 6 HOURS AS NEEDED FOR WHEEZING OR SHORTNESS OF BREATH 6.7 g 2   BD PEN NEEDLE NANO 2ND GEN 32G X 4 MM MISC USE AS DIRECTED TWICE DAILY 100 each 2   Blood Glucose Monitoring Suppl (ACCU-CHEK GUIDE) w/Device KIT USE TO CHECK BLOOD SUGARS ONCE DAILY. 1 kit 0   Cholecalciferol (VITAMIN D3) 2000 units TABS Take 1 tablet by mouth daily.     colchicine  0.6 MG tablet TAKE 1 TABLET BY MOUTH ONCE DAILY AS NEEDED FOR GOUT 30 tablet 1   Continuous Glucose Sensor (DEXCOM G7 SENSOR) MISC Use daily to check blood sugars. Change every 10 days. 4 each 2   diltiazem  (CARDIZEM  CD) 180 MG 24 hr capsule Take 1 capsule (180 mg total) by mouth daily. 90 capsule 1   dorzolamide -timolol  (COSOPT ) 2-0.5 % ophthalmic solution Place 1 drop into the right eye 2 (two) times daily.     ELIQUIS  5 MG TABS tablet TAKE ONE TABLET BY MOUTH TWICE DAILY 180 tablet 1   Evolocumab  (REPATHA  SURECLICK) 140 MG/ML SOAJ Inject 140 mg into the skin every 14 (fourteen) days. 6 mL 3   fluticasone  (FLONASE ) 50 MCG/ACT nasal spray Place 2 sprays into both nostrils daily. 16 g 2   fluticasone -salmeterol (WIXELA INHUB ) 100-50 MCG/ACT AEPB Inhale 1 puff into the lungs 2 (two) times daily. 60 each 5   furosemide  (LASIX ) 20 MG tablet Take 1 tablet (20 mg total) by mouth daily. Can take an additional dose as needed for swelling 30 tablet 10   glucose blood (ACCU-CHEK GUIDE TEST) test strip USE ONCE DAILT TO CHECK BLOOD SUGAR 100 strip 2   glucose blood (ACCU-CHEK GUIDE TEST) test strip Use as instructed 100 each 12   glucose blood (ACCU-CHEK GUIDE) test strip USE TO CHECK BLOOD SUGARS ONCE DAILY. 100 each 12   insulin  aspart protamine - aspart (NOVOLOG  MIX 70/30 FLEXPEN) (70-30) 100 UNIT/ML FlexPen INJECT 40 UNITS SUBCUTANEOUS EVERY MORNING AND 50 UNITS SUBCUTANEOUS AT DINNER 30 mL 2   isosorbide  mononitrate (IMDUR ) 60 MG 24  hr tablet Take 1 tablet (60 mg total) by mouth daily. 90 tablet 2   Latanoprostene Bunod  (VYZULTA ) 0.024 % SOLN Place 1 drop into the right eye at bedtime.     methocarbamol  (ROBAXIN ) 500 MG tablet Take 1 tablet (500 mg total) by mouth every 8 (eight) hours as needed for muscle spasms. 30 tablet 0   metoprolol  succinate (TOPROL -XL) 100 MG 24 hr tablet Take 1.5 tablets (150 mg total) by mouth daily. Take with or immediately following a meal. 135 tablet 2   nitroGLYCERIN  (NITROSTAT ) 0.4 MG SL tablet Place  1 tablet (0.4 mg total) under the tongue every 5 (five) minutes as needed for chest pain. 25 tablet 0   olmesartan  (BENICAR ) 40 MG tablet TAKE 1 TABLET(40 MG) BY MOUTH DAILY 90 tablet 2   pantoprazole  (PROTONIX ) 40 MG tablet TAKE 1 TABLET(40 MG) BY MOUTH DAILY 90 tablet 1   polyethylene glycol (MIRALAX ) 17 g packet Take 17 g by mouth daily. 30 each 0   potassium chloride  (KLOR-CON ) 10 MEQ tablet Take 1 tablet (10 mEq total) by mouth at bedtime. Take when taking lasix  100 tablet 1   spironolactone  (ALDACTONE ) 25 MG tablet Take 1 tablet (25 mg total) by mouth daily. 90 tablet 0   Semaglutide ,0.25 or 0.5MG /DOS, (OZEMPIC , 0.25 OR 0.5 MG/DOSE,) 2 MG/1.5ML SOPN INJECT 0.5MG  INTO SKIN ONCE A WEEK (Patient not taking: Reported on 03/29/2024) 4.5 mL 3   No facility-administered medications prior to visit.    Review of Systems  Constitutional:  Negative for chills, diaphoresis, fever, malaise/fatigue and weight loss.  HENT:  Negative for congestion.   Respiratory:  Positive for cough, sputum production, shortness of breath and wheezing. Negative for hemoptysis.   Cardiovascular:  Negative for chest pain, palpitations and leg swelling.     Objective:   Vitals:   03/29/24 1528  BP: 138/71  Pulse: (!) 56  SpO2: 97%  Weight: 228 lb 1.6 oz (103.5 kg)  Height: 5' 5 (1.651 m)    SpO2: 97 %  Physical Exam: General: Well-appearing, no acute distress HENT: , AT Eyes: EOMI, no scleral  icterus Respiratory: Clear to auscultation bilaterally.  No crackles, wheezing or rales Cardiovascular: RRR, -M/R/G, no JVD Extremities:-Edema,-tenderness Neuro: AAO x4, CNII-XII grossly intact Psych: Normal mood, normal affect  Data Reviewed:  Imaging: CT Chest 08/13/22 - Mild peripheral subpleural reticulation at lung bases. Parenchymal banding identified within lingular and lung bases  CT Chest 11/27/23 - Stable bilateral pulmonary nodules with largest 2 mm. No new pulmonary nodules or masses. Mild LUL atelectasis. Enlargement of pulmonary artery.  PFT: 05/25/23 FVC 1.69 (61%) FEV1 1.41 (68%) Ratio 83  TLC 85% DLCO 70%. Significant bronchodilator response Interpretation: No obstructive defect or restrictive defect. Reduced FVC and FEV1. Significant bronchodilator response suggestive of reversible obstructive disease such as asthma. Mildly reduced DLCO  Labs: CBC    Component Value Date/Time   WBC 11.9 (H) 06/16/2023 0010   RBC 5.37 (H) 06/16/2023 0010   HGB 14.7 06/16/2023 0010   HGB 13.8 04/21/2023 1226   HCT 46.2 (H) 06/16/2023 0010   HCT 44.6 04/21/2023 1226   PLT 252 06/16/2023 0010   PLT 281 04/21/2023 1226   MCV 86.0 06/16/2023 0010   MCV 87 04/21/2023 1226   MCV 85 05/26/2013 2128   MCH 27.4 06/16/2023 0010   MCHC 31.8 06/16/2023 0010   RDW 15.3 06/16/2023 0010   RDW 13.8 04/21/2023 1226   RDW 14.5 05/26/2013 2128   LYMPHSABS 1.4 11/30/2022 1635   MONOABS 0.9 11/30/2022 1635   EOSABS 0.2 11/30/2022 1635   BASOSABS 0.0 11/30/2022 1635     Sleep: HST 05/25/22 - 12.5/h PAP titration 10/25/22 - AutoPAP 9 cm with EPR 2 recommended    Assessment & Plan:   Discussion: 79 year old female former smoker with CAD, MI in 2015 and 2024, HTN, HLD, PVD, atrial fibrillation, OSA,, DM2 who presents for follow-up shortness of breath. PFTs consistent with asthma. Suspect multifactorial in setting of recent cardiac issues and deconditioning are also contributing. Recent weight  gain may be contributing.  Has tried and failed brand name Symbicort  and Wixela. Will trial Advair  HFA. If this fails, alternative would be nebulized medications which patient has previously declined.    Assessment & Plan Mild persistent asthma without complication --STOP brand name Symbicort  --START Advair  HFA 230-21 mcg TWO puff in the morning and evening. Rinse mouth out after use --CONTINUE Albuterol  1-2 puffs AS NEEDED every 4-6 hours Multiple lung nodules --Reviewed CT Chest 11/22/23. Subcentimeter nodules stable <18mm. --No further imaging indicated  Health Maintenance Immunization History  Administered Date(s) Administered   Fluad Quad(high Dose 65+) 04/06/2021, 12/30/2021   Fluad Trivalent(High Dose 65+) 04/21/2023   INFLUENZA, HIGH DOSE SEASONAL PF 01/03/2024   Janssen (J&J) SARS-COV-2 Vaccination 05/25/2019   PFIZER(Purple Top)SARS-COV-2 Vaccination 02/21/2020   Tdap 11/04/2017   CT Lung Screen - not qualified. Insufficient tobacco history  No orders of the defined types were placed in this encounter.  Meds ordered this encounter  Medications   DISCONTD: fluticasone -salmeterol (ADVAIR  HFA) 230-21 MCG/ACT inhaler    Sig: Inhale 2 puffs into the lungs 2 (two) times daily.    Dispense:  12 g    Refill:  12   fluticasone -salmeterol (ADVAIR  HFA) 230-21 MCG/ACT inhaler    Sig: Inhale 2 puffs into the lungs 2 (two) times daily.    Dispense:  12 g    Refill:  5    Brand or generic per insurance preferred    Return in about 3 months (around 06/27/2024).  I have spent a total time of 30-minutes on the day of the appointment including chart review, data review, collecting history, coordinating care and discussing medical diagnosis and plan with the patient/family. Past medical history, allergies, medications were reviewed. Pertinent imaging, labs and tests included in this note have been reviewed and interpreted independently by me.  Jennifer Holland Slater Staff, MD [facility] 03/29/2024 3:52 PM     "

## 2024-03-29 NOTE — Assessment & Plan Note (Addendum)
--  STOP brand name Symbicort  --START Advair  HFA 230-21 mcg TWO puff in the morning and evening. Rinse mouth out after use --CONTINUE Albuterol  1-2 puffs AS NEEDED every 4-6 hours

## 2024-04-02 ENCOUNTER — Ambulatory Visit: Payer: Self-pay

## 2024-04-02 DIAGNOSIS — E042 Nontoxic multinodular goiter: Secondary | ICD-10-CM

## 2024-04-03 ENCOUNTER — Telehealth (HOSPITAL_BASED_OUTPATIENT_CLINIC_OR_DEPARTMENT_OTHER): Payer: Self-pay | Admitting: *Deleted

## 2024-04-03 NOTE — Telephone Encounter (Signed)
 Duplicate messages. Pt was seen by Orren Fabry, PA-C for surgical clearance. Follow up testing is pending and Orren will follow up on this. Future duplicate requests do NOT need to be routed to preop pool. Glendia Ferrier, PA-C    04/03/2024 4:37 PM

## 2024-04-03 NOTE — Telephone Encounter (Signed)
 Requesting office sent duplicate inquiring if pt has been cleared. Looks like pt was seen 03/27/24 Dawn Wood, [facility] with an order for an echo to be done as well.

## 2024-04-04 ENCOUNTER — Other Ambulatory Visit (HOSPITAL_BASED_OUTPATIENT_CLINIC_OR_DEPARTMENT_OTHER): Payer: Self-pay

## 2024-04-04 ENCOUNTER — Telehealth (HOSPITAL_BASED_OUTPATIENT_CLINIC_OR_DEPARTMENT_OTHER): Payer: Self-pay

## 2024-04-04 ENCOUNTER — Encounter (HOSPITAL_BASED_OUTPATIENT_CLINIC_OR_DEPARTMENT_OTHER): Payer: Self-pay | Admitting: Pulmonary Disease

## 2024-04-04 MED ORDER — FLUTICASONE-SALMETEROL 230-21 MCG/ACT IN AERO
2.0000 | INHALATION_SPRAY | Freq: Two times a day (BID) | RESPIRATORY_TRACT | 5 refills | Status: DC
  Filled 2024-04-04: qty 12, 30d supply, fill #0

## 2024-04-04 NOTE — Telephone Encounter (Signed)
 Advair  HFA pricing at [facility] pharmacy $40. Called patient if patient wished to change to pharmacy with preferred pricing. She agreed. Order placed. Closing encounter.

## 2024-04-04 NOTE — Telephone Encounter (Signed)
 Copied from CRM [id number]. Topic: Clinical - Medication Question >> Apr 04, 2024  9:46 AM Leila BROCKS wrote: Reason for CRM: Patient [phone] wants to speak with Dr. Laymond nurse about some medicine and will not provided any information, only wants to speak with the nurse. Please call back.

## 2024-04-05 ENCOUNTER — Other Ambulatory Visit (HOSPITAL_COMMUNITY): Payer: Self-pay

## 2024-04-05 ENCOUNTER — Other Ambulatory Visit (HOSPITAL_BASED_OUTPATIENT_CLINIC_OR_DEPARTMENT_OTHER): Payer: Self-pay

## 2024-04-05 ENCOUNTER — Telehealth (HOSPITAL_BASED_OUTPATIENT_CLINIC_OR_DEPARTMENT_OTHER): Payer: Self-pay

## 2024-04-05 MED ORDER — FLUTICASONE-SALMETEROL 230-21 MCG/ACT IN AERO
2.0000 | INHALATION_SPRAY | Freq: Two times a day (BID) | RESPIRATORY_TRACT | 5 refills | Status: AC
  Filled 2024-04-05: qty 12, 30d supply, fill #0

## 2024-04-05 NOTE — Telephone Encounter (Signed)
 Dawn Wood

## 2024-04-05 NOTE — Telephone Encounter (Signed)
 Pt notified that rx is currently filled at [facility] and she will have to have them back out of RX since she's never picked it up. She has requested this med be sent to [facility] at [facility] she will have to call and cancel order at [facility] and she is aware of this. Rx has been sent to [facility]    Copied from CRM [id number]. Topic: Clinical - Prescription Issue >> Apr 05, 2024  9:17 AM Joesph PARAS wrote: Reason for CRM: Patient is requesting that Advair  be transferred to [facility] on [facility] only if that pharmacy is preferred. Patient asked PAS to tell her if that pharmacy is preferred, PAS attempted to advise that we are not privy to that information.  Patient then states she does not want PAS to send a message because she never got a call back. Per chart notes, patient was called back same day by CMA Lakeesha Fontanilla. Patient then states she was told the pharmacy would call her to notify her that the medication is ready and that they did not. Attempted to explain that I do not discuss with the pharmacy and we, as a clinic, have no control over if the pharmacy calls her or not but patient does not voice understanding.  Patient then demanded contact number for [facility] and upon being provided that, promptly disconnected. Goal of patient unclear, sending as information only.

## 2024-04-09 ENCOUNTER — Ambulatory Visit: Payer: Self-pay

## 2024-04-09 NOTE — Telephone Encounter (Signed)
 FYI Only or Action Required?: FYI only for provider: ED advised. Declines ED or 911  Patient was last seen in primary care on 03/20/2024 by Bernardo Gaither SAUNDERS, DO.  Called Nurse Triage reporting Constipation.  Symptoms began several days ago.  Interventions attempted: Nothing.  Symptoms are: gradually worsening.  Triage Disposition: Go to ED Now (Notify PCP)  Patient/caregiver understands and will follow disposition?: No, refuses disposition   Pt c/o constipation x2-3 days.  Last major BM was 1 week ago. Since then has had a few small BM's with soft brown  stool and mucus. 10/10 cramping pain in lower right stomach, last about 10 minutes. Worse when going to the BR to urinate. Feels like she needs to have a BM. No pain or burning with urination. No pain currently. Blood per rectum when wiping with small amount in the toilet. No hx of hemorrhoids. No emesis or fever.   Triage cut short d/t severity of pt symptoms. Advised ED. Pt declines, does not have someone to take her right now, would have to find someone. Offered to call 911 for pt, declines d/t the cost of ambulance drive. Explained current symptoms warrant emergent evaluation and treatment in the ED and would not be able to be properly treated in a timely manner in the office. Pt verbalized understanding.     Message from [place] D sent at 04/09/2024  1:24 PM EST  Pt wants to speak with nurse   Reason for Disposition  [1] SEVERE pain AND [2] age > 60 years  Answer Assessment - Initial Assessment Questions 1. LOCATION: Where does it hurt?      Lower right abdomen  2. RADIATION: Does the pain shoot anywhere else? (e.g., chest, back)     *No Answer*  3. ONSET: When did the pain begin? (e.g., minutes, hours or days ago)      2-3 days ago  4. SUDDEN: Gradual or sudden onset?     Comes and goes per pt  5. PATTERN Does the pain come and go, or is it constant?     Comes and goes  6. SEVERITY: How bad is the pain?   (e.g., Scale 1-10; mild, moderate, or severe)     Gets up to 10/10 cramping pain, lasts 10 minutes.  7. RECURRENT SYMPTOM: Have you ever had this type of stomach pain before? If Yes, ask: When was the last time? and What happened that time?      Years ago in the 42's. Took some castor oil.   8. CAUSE: What do you think is causing the stomach pain? (e.g., gallstones, recent abdominal surgery)     Constipation  9. RELIEVING/AGGRAVATING FACTORS: What makes it better or worse? (e.g., antacids, bending or twisting motion, bowel movement)     Using the BR makes it worse.  10. OTHER SYMPTOMS: Do you have any other symptoms? (e.g., back pain, diarrhea, fever, urination pain, vomiting)       Blood when wiping, some mucus per rectum.  Protocols used: Abdominal Pain - Female-A-AH

## 2024-04-10 ENCOUNTER — Telehealth: Payer: Self-pay | Admitting: Internal Medicine

## 2024-04-10 NOTE — Telephone Encounter (Signed)
 Patient is calling regarding a grant for   Evolocumab  (REPATHA  SURECLICK) 140 MG/ML SOAJ

## 2024-04-11 ENCOUNTER — Telehealth: Payer: Self-pay | Admitting: Pharmacy Technician

## 2024-04-11 NOTE — Telephone Encounter (Signed)
 Patient Advocate Encounter   The patient was approved for a [employer] grant that will help cover the cost of repatha  Total amount awarded, 2500.  Effective: 03/22/24 - 03/21/25   APW:[id number] ERW:[id number] Hmnle:[id number] PI:[id number] [employer] ID: [id number]   Pharmacy provided with approval and processing information. Patient informed via telephone/mychart

## 2024-04-11 NOTE — Telephone Encounter (Signed)
 SABRA

## 2024-04-20 ENCOUNTER — Ambulatory Visit (HOSPITAL_COMMUNITY)

## 2024-04-25 ENCOUNTER — Institutional Professional Consult (permissible substitution) (INDEPENDENT_AMBULATORY_CARE_PROVIDER_SITE_OTHER)

## 2024-04-26 ENCOUNTER — Encounter: Payer: Self-pay | Admitting: Internal Medicine

## 2024-06-25 ENCOUNTER — Ambulatory Visit: Admitting: Physician Assistant

## 2024-06-26 ENCOUNTER — Ambulatory Visit (HOSPITAL_BASED_OUTPATIENT_CLINIC_OR_DEPARTMENT_OTHER): Admitting: Pulmonary Disease

## 2024-11-14 ENCOUNTER — Ambulatory Visit: Payer: Self-pay
